# Patient Record
Sex: Male | Born: 1945 | Race: White | Hispanic: No | Marital: Married | Smoking: Former smoker
Health system: Southern US, Community
[De-identification: ages and names within clinical notes are randomized; demographics above are authoritative.]

## PROBLEM LIST (undated history)

## (undated) DIAGNOSIS — I499 Cardiac arrhythmia, unspecified: Secondary | ICD-10-CM

## (undated) DIAGNOSIS — T7840XA Allergy, unspecified, initial encounter: Secondary | ICD-10-CM

## (undated) DIAGNOSIS — H269 Unspecified cataract: Secondary | ICD-10-CM

## (undated) DIAGNOSIS — I10 Essential (primary) hypertension: Secondary | ICD-10-CM

## (undated) DIAGNOSIS — D649 Anemia, unspecified: Secondary | ICD-10-CM

## (undated) DIAGNOSIS — E669 Obesity, unspecified: Secondary | ICD-10-CM

## (undated) DIAGNOSIS — M1990 Unspecified osteoarthritis, unspecified site: Secondary | ICD-10-CM

## (undated) DIAGNOSIS — K317 Polyp of stomach and duodenum: Secondary | ICD-10-CM

## (undated) DIAGNOSIS — K859 Acute pancreatitis without necrosis or infection, unspecified: Secondary | ICD-10-CM

## (undated) DIAGNOSIS — K219 Gastro-esophageal reflux disease without esophagitis: Secondary | ICD-10-CM

## (undated) DIAGNOSIS — Z87440 Personal history of urinary (tract) infections: Secondary | ICD-10-CM

## (undated) DIAGNOSIS — C801 Malignant (primary) neoplasm, unspecified: Secondary | ICD-10-CM

## (undated) DIAGNOSIS — E785 Hyperlipidemia, unspecified: Secondary | ICD-10-CM

## (undated) DIAGNOSIS — I219 Acute myocardial infarction, unspecified: Secondary | ICD-10-CM

## (undated) DIAGNOSIS — I2510 Atherosclerotic heart disease of native coronary artery without angina pectoris: Secondary | ICD-10-CM

## (undated) DIAGNOSIS — Z8719 Personal history of other diseases of the digestive system: Secondary | ICD-10-CM

## (undated) HISTORY — DX: Acute myocardial infarction, unspecified: I21.9

## (undated) HISTORY — DX: Atherosclerotic heart disease of native coronary artery without angina pectoris: I25.10

## (undated) HISTORY — DX: Allergy, unspecified, initial encounter: T78.40XA

## (undated) HISTORY — PX: EYE SURGERY: SHX253

## (undated) HISTORY — DX: Obesity, unspecified: E66.9

## (undated) HISTORY — PX: HYDROCELE EXCISION / REPAIR: SUR1145

## (undated) HISTORY — DX: Essential (primary) hypertension: I10

## (undated) HISTORY — DX: Acute pancreatitis without necrosis or infection, unspecified: K85.90

## (undated) HISTORY — PX: OTHER SURGICAL HISTORY: SHX169

## (undated) HISTORY — DX: Anemia, unspecified: D64.9

## (undated) HISTORY — DX: Polyp of stomach and duodenum: K31.7

## (undated) HISTORY — DX: Unspecified cataract: H26.9

## (undated) HISTORY — DX: Gastro-esophageal reflux disease without esophagitis: K21.9

## (undated) HISTORY — PX: JOINT REPLACEMENT: SHX530

## (undated) HISTORY — DX: Hyperlipidemia, unspecified: E78.5

## (undated) HISTORY — PX: UPPER GASTROINTESTINAL ENDOSCOPY: SHX188

## (undated) HISTORY — PX: CARDIAC CATHETERIZATION: SHX172

---

## 1998-07-09 ENCOUNTER — Encounter: Payer: Self-pay | Admitting: *Deleted

## 1998-07-09 ENCOUNTER — Inpatient Hospital Stay (HOSPITAL_COMMUNITY): Admission: EM | Admit: 1998-07-09 | Discharge: 1998-07-13 | Payer: Self-pay | Admitting: Emergency Medicine

## 2002-09-23 ENCOUNTER — Inpatient Hospital Stay (HOSPITAL_COMMUNITY): Admission: EM | Admit: 2002-09-23 | Discharge: 2002-09-26 | Payer: Self-pay | Admitting: Emergency Medicine

## 2002-09-23 ENCOUNTER — Encounter: Payer: Self-pay | Admitting: Cardiology

## 2002-09-25 ENCOUNTER — Encounter: Payer: Self-pay | Admitting: Cardiology

## 2002-09-26 ENCOUNTER — Encounter: Payer: Self-pay | Admitting: Cardiology

## 2003-11-25 HISTORY — PX: COLONOSCOPY: SHX174

## 2003-12-11 ENCOUNTER — Encounter: Payer: Self-pay | Admitting: Internal Medicine

## 2003-12-29 ENCOUNTER — Encounter: Admission: RE | Admit: 2003-12-29 | Discharge: 2004-01-27 | Payer: Self-pay | Admitting: Family Medicine

## 2004-10-27 ENCOUNTER — Ambulatory Visit: Payer: Self-pay | Admitting: Cardiology

## 2005-12-13 ENCOUNTER — Ambulatory Visit: Payer: Self-pay | Admitting: Cardiology

## 2006-03-13 ENCOUNTER — Emergency Department (HOSPITAL_COMMUNITY): Admission: EM | Admit: 2006-03-13 | Discharge: 2006-03-14 | Payer: Self-pay | Admitting: Emergency Medicine

## 2006-11-25 LAB — HM COLONOSCOPY

## 2006-11-28 ENCOUNTER — Ambulatory Visit: Payer: Self-pay | Admitting: Cardiology

## 2008-01-01 ENCOUNTER — Ambulatory Visit: Payer: Self-pay | Admitting: Cardiology

## 2008-01-14 ENCOUNTER — Ambulatory Visit: Payer: Self-pay

## 2009-03-18 DIAGNOSIS — K219 Gastro-esophageal reflux disease without esophagitis: Secondary | ICD-10-CM

## 2009-03-18 DIAGNOSIS — E785 Hyperlipidemia, unspecified: Secondary | ICD-10-CM

## 2009-03-18 DIAGNOSIS — I10 Essential (primary) hypertension: Secondary | ICD-10-CM | POA: Insufficient documentation

## 2009-03-18 DIAGNOSIS — E669 Obesity, unspecified: Secondary | ICD-10-CM | POA: Insufficient documentation

## 2009-03-18 DIAGNOSIS — I2510 Atherosclerotic heart disease of native coronary artery without angina pectoris: Secondary | ICD-10-CM | POA: Insufficient documentation

## 2009-03-18 DIAGNOSIS — E119 Type 2 diabetes mellitus without complications: Secondary | ICD-10-CM | POA: Insufficient documentation

## 2009-03-18 DIAGNOSIS — I491 Atrial premature depolarization: Secondary | ICD-10-CM

## 2009-03-18 DIAGNOSIS — E782 Mixed hyperlipidemia: Secondary | ICD-10-CM | POA: Insufficient documentation

## 2009-04-28 ENCOUNTER — Ambulatory Visit: Payer: Self-pay | Admitting: Cardiology

## 2009-07-16 ENCOUNTER — Telehealth (INDEPENDENT_AMBULATORY_CARE_PROVIDER_SITE_OTHER): Payer: Self-pay | Admitting: *Deleted

## 2009-10-06 LAB — HEMOCCULT GUIAC POC 1CARD (OFFICE)

## 2010-01-24 LAB — HM DIABETES EYE EXAM

## 2010-03-23 ENCOUNTER — Ambulatory Visit: Payer: Self-pay | Admitting: Cardiology

## 2010-05-26 DEATH — deceased

## 2010-07-16 ENCOUNTER — Encounter: Payer: Self-pay | Admitting: Family Medicine

## 2010-07-17 ENCOUNTER — Encounter: Payer: Self-pay | Admitting: Family Medicine

## 2010-07-17 ENCOUNTER — Encounter: Payer: Self-pay | Admitting: Gastroenterology

## 2010-07-19 ENCOUNTER — Encounter
Admission: RE | Admit: 2010-07-19 | Discharge: 2010-07-19 | Payer: Self-pay | Source: Home / Self Care | Attending: Family Medicine | Admitting: Family Medicine

## 2010-07-26 NOTE — Assessment & Plan Note (Signed)
Summary: Marvin Frost   Visit Type:  Follow-up Primary Provider:  Vernon Prey, MD  CC:  CAD.  History of Present Illness: The patient presents for yearly followup. Since I last saw him he has had no new cardiovascular complaints. He underwent shoulder surgery without problems. He's had a little trouble remaining physically active because of this. However, he's been doing his activities of daily living and yard work without any cardiovascular complaints. He denies any chest discomfort, neck or arm discomfort. He's had no palpitations, presyncope or syncope. He's had no PND or orthopnea. He is not exercising as much as I would like.  Current Medications (verified): 1)  Benicar Hct 40-12.5 Mg Tabs (Olmesartan Medoxomil-Hctz) .Marland Kitchen.. 1 By Mouth Daily 2)  Glucosamine-Chondroitin  Caps (Glucosamine-Chondroit-Vit C-Mn) .Marland Kitchen.. 1 Po  Daily 3)  Aspirin 81 Mg  Tabs (Aspirin) .Marland Kitchen.. 1 By Mouth Daily 4)  Metformin Hcl 1000 Mg Tabs (Metformin Hcl) .Marland Kitchen.. 1 By Mouth Two Times A Day 5)  Levemir 100 Unit/ml Soln (Insulin Detemir) .... As Directed 6)  Victoza 18 Mg/38ml Soln (Liraglutide) .... As Directed 7)  Vitamin D3 2000 Unit Caps (Cholecalciferol) .... Daily 8)  Zetia 10 Mg Tabs (Ezetimibe) .Marland Kitchen.. 1 By Mouth Daily 9)  Protonix 40 Mg Tbec (Pantoprazole Sodium) .Marland Kitchen.. 1 By Mouth Daily 10)  Duetact 30-4 Mg Tabs (Pioglitazone Hcl-Glimepiride) .Marland Kitchen.. 1 By Mouth Daily 11)  Lovaza 1 Gm Caps (Omega-3-Acid Ethyl Esters) .Marland Kitchen.. 1 By Mouth Daily 12)  Multivitamins   Tabs (Multiple Vitamin) .Marland Kitchen.. 1 By Mouth Daily 13)  Crestor 5 Mg Tabs (Rosuvastatin Calcium) .Marland Kitchen.. 1 By Mouth Daily 14)  Voltaren 1 % Gel (Diclofenac Sodium) .... Qid  Allergies (verified): No Known Drug Allergies  Past History:  Past Medical History:  1. Coronary artery disease (Catheterizations in 2000 and 2004:  The LAD had 25% mid-long stenosis, mid to distal 30-40% stenosis, the circumflex included a small posterolateral with proximal 70% stenosis, the  right coronary artery was free of high-grade disease and dominant.  He had a stress perfusion study, which demonstrated no ischemia or infarct 2009.)   2. Premature atrial contractions.   3. Hypertension.   4. Diabetes for approximately 9 years.   5. Hyperlipidemia.   6. Gastroesophageal reflux disease.   7. Obesity.   Past Surgical History: Hydrocele repair.  Right shoulder surgery  Review of Systems       As stated in the HPI and negative for all other systems.   Vital Signs:  Patient profile:   65 year old male Height:      68 inches Weight:      232 pounds BMI:     35.40 Pulse rate:   83 / minute Resp:     16 per minute BP sitting:   112 / 70  (right arm)  Vitals Entered By: Marrion Coy, CNA (March 23, 2010 1:12 PM)  Physical Exam  General:  Well developed, well nourished, in no acute distress. Head:  normocephalic and atraumatic Eyes:  PERRLA/EOM intact; conjunctiva and lids normal. Mouth:  Teeth, gums and palate normal. Oral mucosa normal. Neck:  Neck supple, no JVD. No masses, thyromegaly or abnormal cervical nodes. Chest Wall:  no deformities or breast masses noted Lungs:  Clear bilaterally to auscultation and percussion. Heart:  Non-displaced PMI, chest non-tender; regular rate and rhythm, S1, S2 without murmurs, rubs or gallops. Carotid upstroke normal, no bruit. Normal abdominal aortic size, no bruits. Femorals normal pulses, no bruits. Pedals normal pulses. No edema, no  varicosities. Abdomen:  Bowel sounds positive; abdomen soft and non-tender without masses, organomegaly, or hernias noted. No hepatosplenomegaly. Msk:  Back normal, normal gait. Muscle strength and tone normal. Extremities:  No clubbing or cyanosis. Neurologic:  Alert and oriented x 3. Skin:  Intact without lesions or rashes. Cervical Nodes:  no significant adenopathy Psych:  Normal affect.   EKG  Procedure date:  03/23/2010  Findings:      Sinus rhythm, rate 84, axis within  normal limits, intervals within normal limits, no acute ST-T wave changes  Impression & Recommendations:  Problem # 1:  CAD (ICD-414.00) The patient has had no new symptoms and can continue with secondary risk reduction. His stress perfusion study was in 2009. No further testing is indicated.  Problem # 2:  HYPERLIPIDEMIA (ICD-272.4) I reviewed his recent lipid profile by Dr. Christell Constant. He is an excellent ratio and good combination therapy and I defer to Dr. Kathi Der care.  Problem # 3:  DM (ICD-250.00) He understands that this has not been as well controlled as we would like it and he continues to work on this. Certainly diet and exercise will contribute.  Problem # 4:  OBESITY (ICD-278.00) We discussed the need for weight loss with diet and exercise. Every year I emphasized routine exercise.  Patient Instructions: 1)  Your physician recommends that you schedule a follow-up appointment in: 1 year with Dr Antoine Poche 2)  Your physician recommends that you continue on your current medications as directed. Please refer to the Current Medication list given to you today.

## 2010-07-26 NOTE — Progress Notes (Signed)
  Phone Note From Other Clinic   Caller: Huntley Dec Details for Reason: Pt.Information Initial call taken by: Lowell Bouton over to the [facility] [phone] [facility]  July 16, 2009 1:49 PM

## 2010-08-15 ENCOUNTER — Telehealth (INDEPENDENT_AMBULATORY_CARE_PROVIDER_SITE_OTHER): Payer: Self-pay

## 2010-08-16 ENCOUNTER — Inpatient Hospital Stay (HOSPITAL_COMMUNITY): Payer: Medicare Other

## 2010-08-16 ENCOUNTER — Ambulatory Visit (INDEPENDENT_AMBULATORY_CARE_PROVIDER_SITE_OTHER): Payer: Medicare Other | Admitting: Physician Assistant

## 2010-08-16 ENCOUNTER — Encounter: Payer: Self-pay | Admitting: Physician Assistant

## 2010-08-16 ENCOUNTER — Inpatient Hospital Stay (HOSPITAL_COMMUNITY)
Admission: AD | Admit: 2010-08-16 | Discharge: 2010-08-19 | DRG: 440 | Disposition: A | Discharge status: Home / Self Care | Payer: Medicare Other | Source: Ambulatory Visit | Attending: Gastroenterology | Admitting: Gastroenterology

## 2010-08-16 DIAGNOSIS — R1013 Epigastric pain: Secondary | ICD-10-CM | POA: Diagnosis present

## 2010-08-16 DIAGNOSIS — R112 Nausea with vomiting, unspecified: Secondary | ICD-10-CM | POA: Insufficient documentation

## 2010-08-16 DIAGNOSIS — R509 Fever, unspecified: Secondary | ICD-10-CM

## 2010-08-16 DIAGNOSIS — E119 Type 2 diabetes mellitus without complications: Secondary | ICD-10-CM | POA: Diagnosis present

## 2010-08-16 DIAGNOSIS — K2970 Gastritis, unspecified, without bleeding: Secondary | ICD-10-CM | POA: Diagnosis present

## 2010-08-16 DIAGNOSIS — K859 Acute pancreatitis without necrosis or infection, unspecified: Principal | ICD-10-CM | POA: Diagnosis present

## 2010-08-16 DIAGNOSIS — T466X5A Adverse effect of antihyperlipidemic and antiarteriosclerotic drugs, initial encounter: Secondary | ICD-10-CM | POA: Diagnosis present

## 2010-08-16 DIAGNOSIS — R1011 Right upper quadrant pain: Secondary | ICD-10-CM | POA: Insufficient documentation

## 2010-08-16 DIAGNOSIS — K449 Diaphragmatic hernia without obstruction or gangrene: Secondary | ICD-10-CM | POA: Diagnosis present

## 2010-08-16 DIAGNOSIS — I2510 Atherosclerotic heart disease of native coronary artery without angina pectoris: Secondary | ICD-10-CM | POA: Diagnosis present

## 2010-08-16 DIAGNOSIS — E781 Pure hyperglyceridemia: Secondary | ICD-10-CM | POA: Diagnosis present

## 2010-08-16 DIAGNOSIS — I10 Essential (primary) hypertension: Secondary | ICD-10-CM | POA: Diagnosis present

## 2010-08-16 LAB — COMPREHENSIVE METABOLIC PANEL
ALT: 16 U/L (ref 0–53)
CO2: 23 mEq/L (ref 19–32)
Calcium: 8.8 mg/dL (ref 8.4–10.5)
Chloride: 94 mEq/L — ABNORMAL LOW (ref 96–112)
GFR calc non Af Amer: >60 mL/min (ref >60)
Glucose, Bld: 157 mg/dL — ABNORMAL HIGH (ref 70–99)
Sodium: 126 mEq/L — ABNORMAL LOW (ref 135–145)
Total Bilirubin: 0.6 mg/dL (ref 0.3–1.2)

## 2010-08-16 LAB — DIFFERENTIAL
Eosinophils Absolute: 0.3 10*3/uL (ref 0.0–0.7)
Eosinophils Relative: 3 % (ref 0–5)
Lymphs Abs: 1.6 10*3/uL (ref 0.7–4.0)

## 2010-08-16 LAB — CBC
HCT: 39.7 % (ref 39.0–52.0)
MCHC: 34.3 g/dL (ref 30.0–36.0)
MCV: 85.9 fL (ref 78.0–100.0)
RDW: 13.8 % (ref 11.5–15.5)

## 2010-08-16 LAB — URINALYSIS, ROUTINE W REFLEX MICROSCOPIC
Nitrite: NEGATIVE
Protein, ur: NEGATIVE mg/dL
Specific Gravity, Urine: 1.027 (ref 1.005–1.030)
Urobilinogen, UA: 0.2 mg/dL (ref 0.0–1.0)

## 2010-08-16 LAB — GLUCOSE, CAPILLARY

## 2010-08-16 LAB — LIPASE, BLOOD: Lipase: 38 U/L (ref 11–59)

## 2010-08-16 MED ORDER — IOHEXOL 300 MG/ML  SOLN
100.0000 mL | Freq: Once | INTRAMUSCULAR | Status: AC | PRN
  Administered 2010-08-16: 100 mL via INTRAVENOUS

## 2010-08-17 DIAGNOSIS — R1013 Epigastric pain: Secondary | ICD-10-CM

## 2010-08-17 DIAGNOSIS — K859 Acute pancreatitis without necrosis or infection, unspecified: Secondary | ICD-10-CM

## 2010-08-17 LAB — BASIC METABOLIC PANEL
BUN: 6 mg/dL (ref 6–23)
CO2: 24 mEq/L (ref 19–32)
Calcium: 8.3 mg/dL — ABNORMAL LOW (ref 8.4–10.5)
Chloride: 89 mEq/L — ABNORMAL LOW (ref 96–112)
Creatinine, Ser: 0.82 mg/dL (ref 0.4–1.5)
GFR calc Af Amer: >60 mL/min (ref >60)
GFR calc non Af Amer: >60 mL/min (ref >60)
Glucose, Bld: 299 mg/dL — ABNORMAL HIGH (ref 70–99)
Potassium: 3.8 mEq/L (ref 3.5–5.1)
Sodium: 120 mEq/L — ABNORMAL LOW (ref 135–145)

## 2010-08-17 LAB — GLUCOSE, CAPILLARY
Glucose-Capillary: 226 mg/dL — ABNORMAL HIGH (ref 70–99)
Glucose-Capillary: 268 mg/dL — ABNORMAL HIGH (ref 70–99)

## 2010-08-17 LAB — CBC
HCT: 37.5 % — ABNORMAL LOW (ref 39.0–52.0)
Hemoglobin: 13 g/dL (ref 13.0–17.0)
MCH: 30 pg (ref 26.0–34.0)
MCHC: 34.7 g/dL (ref 30.0–36.0)
MCV: 86.6 fL (ref 78.0–100.0)
Platelets: 278 10*3/uL (ref 150–400)
RBC: 4.33 MIL/uL (ref 4.22–5.81)
RDW: 13.6 % (ref 11.5–15.5)
WBC: 12.2 10*3/uL — ABNORMAL HIGH (ref 4.0–10.5)

## 2010-08-18 DIAGNOSIS — K859 Acute pancreatitis without necrosis or infection, unspecified: Secondary | ICD-10-CM

## 2010-08-18 DIAGNOSIS — R1013 Epigastric pain: Secondary | ICD-10-CM

## 2010-08-18 LAB — BASIC METABOLIC PANEL
BUN: 4 mg/dL — ABNORMAL LOW (ref 6–23)
CO2: 27 mEq/L (ref 19–32)
Calcium: 8.5 mg/dL (ref 8.4–10.5)
Chloride: 103 mEq/L (ref 96–112)
Creatinine, Ser: 0.97 mg/dL (ref 0.4–1.5)
GFR calc Af Amer: >60 mL/min (ref >60)
GFR calc non Af Amer: >60 mL/min (ref >60)
Glucose, Bld: 304 mg/dL — ABNORMAL HIGH (ref 70–99)
Potassium: 4.1 mEq/L (ref 3.5–5.1)
Sodium: 133 mEq/L — ABNORMAL LOW (ref 135–145)

## 2010-08-18 LAB — CBC
HCT: 38.5 % — ABNORMAL LOW (ref 39.0–52.0)
Hemoglobin: 12.5 g/dL — ABNORMAL LOW (ref 13.0–17.0)
MCH: 28.5 pg (ref 26.0–34.0)
MCHC: 32.5 g/dL (ref 30.0–36.0)
MCV: 87.9 fL (ref 78.0–100.0)
Platelets: 263 10*3/uL (ref 150–400)
RBC: 4.38 MIL/uL (ref 4.22–5.81)
RDW: 13.7 % (ref 11.5–15.5)
WBC: 6.9 10*3/uL (ref 4.0–10.5)

## 2010-08-18 LAB — GLUCOSE, CAPILLARY
Glucose-Capillary: 298 mg/dL — ABNORMAL HIGH (ref 70–99)
Glucose-Capillary: 183 mg/dL — ABNORMAL HIGH (ref 70–99)
Glucose-Capillary: 190 mg/dL — ABNORMAL HIGH (ref 70–99)
Glucose-Capillary: 217 mg/dL — ABNORMAL HIGH (ref 70–99)

## 2010-08-19 ENCOUNTER — Encounter: Payer: Self-pay | Admitting: Gastroenterology

## 2010-08-19 ENCOUNTER — Encounter (INDEPENDENT_AMBULATORY_CARE_PROVIDER_SITE_OTHER): Payer: Self-pay | Admitting: *Deleted

## 2010-08-19 DIAGNOSIS — K2970 Gastritis, unspecified, without bleeding: Secondary | ICD-10-CM

## 2010-08-19 DIAGNOSIS — K2990 Gastroduodenitis, unspecified, without bleeding: Secondary | ICD-10-CM

## 2010-08-19 LAB — GLUCOSE, CAPILLARY

## 2010-08-22 ENCOUNTER — Encounter: Payer: Self-pay | Admitting: Internal Medicine

## 2010-08-23 NOTE — Letter (Signed)
Summary: New Patient letter  [facility]  [address] [place], [place] [address]   Phone: [phone]  Fax: [phone]       08/19/2010 MRN: [id number]  Marvin Frost [address] [place], [place]  [address]  [place]  Dear Mr. Eschbach,  Welcome to the Gastroenterology Division at [facility].    You are scheduled to see Dr.  Leone Payor on 09-27-10 at 10:00AM. on the 3rd floor at [facility], [address]  We ask that you try to arrive at our office 15 minutes prior to your appointment time to allow for check-in.  We would like you to complete the enclosed self-administered evaluation form prior to your visit and bring it with you on the day of your appointment.  We will review it with you.  Also, please bring a complete list of all your medications or, if you prefer, bring the medication bottles and we will list them.  Please bring your insurance card so that we may make a copy of it.  If your insurance requires a referral to see a specialist, please bring your referral form from your primary care physician.  Co-payments are due at the time of your visit and may be paid by cash, check or credit card.     Your office visit will consist of a consult with your physician (includes a physical exam), any laboratory testing he/she may order, scheduling of any necessary diagnostic testing (e.g. x-ray, ultrasound, CT-scan), and scheduling of a procedure (e.g. Endoscopy, Colonoscopy) if required.  Please allow enough time on your schedule to allow for any/all of these possibilities.    If you cannot keep your appointment, please call [phone] to cancel or reschedule prior to your appointment date.  This allows [place] the opportunity to schedule an appointment for another patient in need of care.  If you do not cancel or reschedule by 5 p.m. the business day prior to your appointment date, you will be charged a $50.00 late cancellation/no-show fee.    Thank you for choosing  [facility] for your medical needs.  We appreciate the opportunity to care for you.  Please visit [place] at our website  to learn more about our practice.                     Sincerely,                                                             The Gastroenterology Division

## 2010-08-23 NOTE — Progress Notes (Signed)
Summary: Abdominal pain  Phone Note Outgoing Call Call back at [facility] Phone [phone]   Call placed by: Darcey Nora RN, CGRN,  August 15, 2010 3:50 PM Call placed to: Patient Summary of Call: Dr Marina Goodell was contacted by Dr Christell Constant from [facility] [employer], patient has been left a message to please call back to schedule an appointment .  Patient has hx with Dr Leone Payor in 2005 Initial call taken by: Darcey Nora RN, CGRN,  August 15, 2010 4:00 PM  Follow-up for Phone Call        Patient will come in and see Mike Gip PA tomorrow at 2:00.  I have called [facility] for records. Follow-up by: Darcey Nora RN, CGRN,  August 15, 2010 4:45 PM

## 2010-08-23 NOTE — Initial Assessments (Signed)
History of Present Illness Visit Type: Follow-up Visit Primary GI MD: Stan Head MD [facility] Primary Provider: Vernon Prey, MD Requesting Provider: Vernon Prey, MD Chief Complaint: Patient complains of abdominal pain and nausea for about Saturday morning. He complains of some vomiting.  History of Present Illness:   VERY NICE 65 Y.O MALE KNOWN TO DR. Leone Payor FROM PRIOR COLONOSCOPY IN 2005 WHICH SHOWED LEFT SIDED DIVERTICULOSIS. PT IS REFERRED NOW BY PRIMRY FOR EVALUATION OF ACUTE UPPER ABDOMINAL PAIN,INSET ABOUT 5 DAYS AGO.HE DEVELOPED PAIN ON 2/17 AFTER DINNER AND SAYS HE HAS HAD CONSTANT FAIRLY INTENSE  PAIN SINCE. PAIN IS LOCATED EPIGASTRIUM AND RUQ WITH RADIATION INTO HIS BACK. HE HAS HAD NAUSEA, AND INTERMITTENT VOMITING, NO APPETITE. HAS ALSO HAD FEVERS TO 101 INTERMITTENTLY SINCE 2/18. NO RIGORS, NO SWEATS. NO URINARY SXS. PAIN HAS NOT MIGRATED. HE IS ON CHRONIC PROTONIX FOR GERD.  HE HAD AN ABDOMINAL US ABOUT 3 WEEKS AGO FOR MILDER ABDOMINAL DISCOMFORT AND THE WAS NEGATIVE FOR GALLSTONES, NORMAL CBD,PANCREAS NOT WELL SEEN,DIFFUSE FATTY CHANGES OF LIVER.  LABS DONE 2/20 SHOW -WBC 12.4,HGB14.4. LFTS FROM JAN 2012 WERE NORMAL.   GI Review of Systems    Reports abdominal pain, nausea, and  vomiting.     Location of  Abdominal pain: upper abdomen.    Denies acid reflux, belching, bloating, chest pain, dysphagia with liquids, dysphagia with solids, heartburn, loss of appetite, vomiting blood, weight loss, and  weight gain.        Denies anal fissure, black tarry stools, change in bowel habit, constipation, diarrhea, diverticulosis, fecal incontinence, heme positive stool, hemorrhoids, irritable bowel syndrome, jaundice, light color stool, liver problems, rectal bleeding, and  rectal pain.    Current Medications (verified): 1)  Benicar Hct 40-12.5 Mg Tabs (Olmesartan Medoxomil-Hctz) .Marland Kitchen.. 1 By Mouth Daily 2)  Glucosamine-Chondroitin  Caps (Glucosamine-Chondroit-Vit C-Mn) .Marland Kitchen.. 1 Po  Daily 3)   Aspirin 81 Mg  Tabs (Aspirin) .Marland Kitchen.. 1 By Mouth Daily 4)  Metformin Hcl 1000 Mg Tabs (Metformin Hcl) .Marland Kitchen.. 1 By Mouth Two Times A Day 5)  Levemir 100 Unit/ml Soln (Insulin Detemir) .... 20 Units Two Times A Day 6)  Victoza 18 Mg/65ml Soln (Liraglutide) .... 18mg  in The Morning 7)  Vitamin D3 2000 Unit Caps (Cholecalciferol) .... Daily 8)  Zetia 10 Mg Tabs (Ezetimibe) .Marland Kitchen.. 1 By Mouth Daily 9)  Protonix 40 Mg Tbec (Pantoprazole Sodium) .Marland Kitchen.. 1 By Mouth in The Morning 10)  Lovaza 1 Gm Caps (Omega-3-Acid Ethyl Esters) .Marland Kitchen.. 1 By Mouth Daily 11)  Multivitamins   Tabs (Multiple Vitamin) .Marland Kitchen.. 1 By Mouth Daily 12)  Crestor 5 Mg Tabs (Rosuvastatin Calcium) .Marland Kitchen.. 1 By Mouth Daily 13)  Voltaren 1 % Gel (Diclofenac Sodium) .... Qid 14)  Dexilant 60 Mg Cpdr (Dexlansoprazole) .... Take One By Mouth At Bedtime 15)  Meclizine Hcl 12.5 Mg Tabs (Meclizine Hcl) .... Take One By Mouth Four Times A Day 16)  Flonase 50 Mcg/act Susp (Fluticasone Propionate) .Marland Kitchen.. 1 Spray Each Nostril At Bedtime 17)  Trilipix 135 Mg Cpdr (Choline Fenofibrate) .... Take One By Mouth Every 3 Days  Allergies (verified): No Known Drug Allergies  Past History:  Past Medical History: Reviewed history from 03/23/2010 and no changes required.  1. Coronary artery disease (Catheterizations in 2000 and 2004:  The LAD had 25% mid-long stenosis, mid to distal 30-40% stenosis, the circumflex included a small posterolateral with proximal 70% stenosis, the right coronary artery was free of high-grade disease and dominant.  He had a stress perfusion  study, which demonstrated no ischemia or infarct 2009.)   2. Premature atrial contractions.   3. Hypertension.   4. Diabetes for approximately 9 years.   5. Hyperlipidemia.   6. Gastroesophageal reflux disease.   7. Obesity.   Past Surgical History: Reviewed history from 03/23/2010 and no changes required. Hydrocele repair.  Right shoulder surgery  Family History: No FH of Colon Cancer: Family  History of Heart Disease: mother, father Family History of Diabetes: brothers, great uncles  Social History:  non-smoker, non-drinker, no drug abuse Daily Caffeine Use couple cups coffee and several soft drinks.  Review of Systems       The patient complains of arthritis/joint pain and muscle pains/cramps.  The patient denies allergy/sinus, anemia, anxiety-new, back pain, blood in urine, breast changes/lumps, change in vision, confusion, cough, coughing up blood, depression-new, fainting, fatigue, fever, headaches-new, hearing problems, heart murmur, heart rhythm changes, itching, night sweats, nosebleeds, shortness of breath, skin rash, sleeping problems, sore throat, swelling of feet/legs, swollen lymph glands, thirst - excessive, urination - excessive, urination changes/pain, urine leakage, vision changes, and voice change.         SEE HPI  Vital Signs:  Patient profile:   65 year old male Height:      68 inches Weight:      216.4 pounds BMI:     33.02 Pulse rate:   88 / minute Pulse rhythm:   regular BP sitting:   110 / 62  (left arm) Cuff size:   regular  Vitals Entered By: Harlow Mares CMA Duncan Dull) (August 16, 2010 1:45 PM)  Physical Exam  General:  Well developed, well nourished, no acute distress. Head:  Normocephalic and atraumatic. Eyes:  PERRLA, no icterus. Lungs:  Clear throughout to auscultation. Heart:  Regular rate and rhythm; no murmurs, rubs,  or bruits. Abdomen:  SOFT, TENDER EPIGASTRIUM AND RUQ WITH GUARDING, NO REBOUND, NO PALP MASS OR HSM,BS+ Rectal:  NOT DONE Extremities:  No clubbing, cyanosis, edema or deformities noted. Neurologic:  Alert and  oriented x4;  grossly normal neurologically. Psych:  Alert and cooperative. Normal mood and affect.   Impression & Recommendations:  Problem # 1:  ABDOMINAL PAIN-EPIGASTRIC (ICD-789.06) Assessment New 64 YO MALE WITH ACUTE EPIGASTRIC AND RUQ PAIN X 5 DAYS ASSOCIATED WITH NAUSEA, VOMITING AND FEVER. SUSPECT  GALLBLADDER DISEASE THOUGH NEAGTIVE [place] RECENTLY -R/O ACALCULOUS CHOLECYSTITIS,R/O PANCREATITIS,R/O RETRO CECAL APPENDICITIS.  ADMIT TO [facility] BASELINE LABS CT ABDOMEN/PELVIS TODAYPAIN CONTROL IV UNASYN SEE ORDERS FOR FURTHER DETAILS.  Problem # 2:  GERD (ICD-530.81) Assessment: Comment Only CONTINUE PROTONIX  Problem # 3:  HYPERLIPIDEMIA (ICD-272.4) Assessment: Comment Only  Problem # 4:  DM (ICD-250.00) Assessment: Comment Only  Problem # 5:  HYPERTENSION (ICD-401.9) Assessment: Comment Only  Problem # 6:  CAD (ICD-414.00) Assessment: Comment Only STABLE. WOULD LIKE DR. HOCHREIN NOTIFIED FOR CLEARANCE IF NEEDS SURGERY

## 2010-08-23 NOTE — Procedures (Signed)
Summary: Upper Endoscopy  Patient: [name] Note: All result statuses are Final unless otherwise noted.  Tests: (1) Upper Endoscopy (EGD)   EGD Upper Endoscopy       DONE     [facility]     [address] [place], [place]  [address]           ENDOSCOPY PROCEDURE REPORT           PATIENT:  Marvin Frost, Marvin Frost  MR#:  [id number]     BIRTHDATE:  22-Mar-1946, 64 yrs. old  GENDER:  male     ENDOSCOPIST:  Rachael Fee, MD     PROCEDURE DATE:  08/19/2010     PROCEDURE:  EGD, diagnostic 43235     ASA CLASS:  Class II     INDICATIONS:  abd pain, likely from mild pancreatitis but had     intermittent pains preceeding the acute pain episode     MEDICATIONS:  Fentanyl 75 mcg IV, Versed 6 mg IV     TOPICAL ANESTHETIC:  none           DESCRIPTION OF PROCEDURE:   After the risks benefits and     alternatives of the procedure were thoroughly explained, informed     consent was obtained.  The  endoscope was introduced through the     mouth and advanced to the second portion of the duodenum, without     limitations.  The instrument was slowly withdrawn as the mucosa     was fully examined.     <<PROCEDUREIMAGES>>           There was mild, non-specific gastritis (see image7).  A hiatal     hernia was found. This was 2cm (see image4).  Otherwise the     examination was normal (see image6, image5, image3, image2, and     image1).    Retroflexed views revealed no abnormalities.    The     scope was then withdrawn from the patient and the procedure     completed.           COMPLICATIONS:  None           ENDOSCOPIC IMPRESSION:     1) Mild, non-specific gastritis     2) Small hiatal hernia     3) Otherwise normal examination; these findings do not explain     his recent discomforts.           RECOMMENDATIONS:     Solid food diet, if he tolerates then OK to go home later today.           Will need follow up appt with Dr. Leone Payor in 7-8 weeks.     Should not restart Triliplex  (likely the cause of his     pancreatitis).           ______________________________     Rachael Fee, MD           cc: Stan Head, MD           n.     eSIGNED:   Rachael Fee at 08/19/2010 09:02 AM           Henry Russel, [id number]  Note: An exclamation mark (!) indicates a result that was not dispersed into the flowsheet. Document Creation Date: 08/19/2010 9:03 AM _______________________________________________________________________  (1) Order result status: Final Collection or observation date-time: 08/19/2010 08:52 Requested date-time:  Receipt date-time:  Reported date-time:  Referring Physician:   Ordering Physician: Rob Bunting [phone]) Specimen Source:  Source: Launa Grill Order Number: [id number] Lab site:

## 2010-08-23 NOTE — Procedures (Signed)
Summary: Colonoscopy   Colonoscopy  Procedure date:  12/11/2003  Findings:      Location:  [facility].   Patient Name: Marvin Frost, Marvin Frost MRN:  Procedure Procedures: Colonoscopy CPT: [phone].  Personnel: Endoscopist: Iva Boop, MD, [facility].  Referred By: Rudi Heap, MD.  Exam Location: Exam performed in Outpatient Clinic. Outpatient  Patient Consent: Procedure, Alternatives, Risks and Benefits discussed, consent obtained, from patient. Consent was obtained by the RN.  Indications Symptoms: Rare rectal bleeding.  Average Risk Screening Routine.  History  Current Medications: Patient is not currently taking Coumadin.  Pre-Exam Physical: Performed Dec 11, 2003. Cardio-pulmonary exam, Rectal exam, HEENT exam , Abdominal exam, Mental status exam WNL.  Exam Exam: Extent of exam reached: Cecum, extent intended: Cecum.  The cecum was identified by appendiceal orifice and IC valve. Patient position: on left side. Colon retroflexion performed. Images taken. ASA Classification: III. Tolerance: excellent.  Monitoring: Pulse and BP monitoring, Oximetry used. Supplemental O2 given.  Colon Prep Used Half-Lytely for colon prep. Prep results: excellent.  Sedation Meds: Patient assessed and found to be appropriate for moderate (conscious) sedation. Fentanyl 75 mcg. given IV. Versed 5 mg. given IV.  Findings - NORMAL EXAM: Cecum to Descending Colon.  DIVERTICULOSIS: Sigmoid Colon. Not bleeding. ICD9: Diverticulosis, Colon: 562.10. Comments: moderately-severe.  NORMAL EXAM: Rectum.   Assessment Abnormal examination, see findings above.  Diagnoses: 562.10: Diverticulosis, Colon.   Comments: NO POLYPS OR CANCER SEEN Events  Unplanned Interventions: No intervention was required.  Plans Patient Education: Patient given standard instructions for: Diverticulosis. Yearly hemoccult testing recommended. WOULD START HEMOCCULTS IN 5 YEARS.  Comments: SWITCH PPI TO  PROTONIX, 1 YEAR RX WRITTEN Disposition: After procedure patient sent to recovery. After recovery patient sent home.  Scheduling/Referral: Primary Care Provider, to Rudi Heap, MD, AS PLANNED,  Colonoscopy, to Iva Boop, MD, [facility], 10 YEARS FOR SCREENING,  Clinic Visit, to Iva Boop, MD, [facility], AS NEEDED,    cc: Shawna Clamp, MD

## 2010-09-15 NOTE — Discharge Summary (Signed)
NAME:  Marvin Frost, Marvin Frost NO.:  [id number]  MEDICAL RECORD NO.:  [id number]           PATIENT TYPE:  I  LOCATION:  1314                         FACILITY:  [facility]  PHYSICIAN:  Rachael Fee, MD   DATE OF BIRTH:  08-02-1945  DATE OF ADMISSION:  08/16/2010 DATE OF DISCHARGE:  08/19/2010                              DISCHARGE SUMMARY   DISCHARGING PHYSICIAN:  Rachael Fee, MD  PRIMARY GASTROENTEROLOGIST:  Iva Boop, MD, Clementeen Graham  DISPOSITION:  Home in stable condition.  DISCHARGE DIAGNOSES: 1. Acute pancreatitis, mild.  Etiology was felt to be secondary to     Trilipix which the patient recently started for     hypertriglyceridemia.  Symptoms greatly improved at time of     discharge. 2. Coronary artery disease. 3. Hypertension. 4. Diabetes, on oral agents and insulin at home. 5. Gastroesophageal reflux disease.  DISCHARGE MEDICATIONS:  Continue home medications including, 1. Fluticasone nasal spray 50 mcg 1 spray daily as needed. 2. Meclizine 12.5 mg 1 tablet 4 times a day as needed. 3. Voltaren gel 1% topical 4 times a day as needed. 4. Dexilant 60 mg 1 tablet every evening. 5. Multiple vitamins 1 tablet daily. 6. Lovaza 1 g 1 tablet every evening. 7. Glimepiride 4 mg 1 tablet every evening. 8. Zetia 10 mg 1 tablet every evening. 9. Vitamin D3 1000 units 2 tablets every morning. 10.Crestor 5 mg 1 tablet every morning. 11.Victoza subcutaneous 1.8 mg every morning. 12.Levemir 18 units subcutaneous twice daily. 13.Metformin 1000 mg twice daily. 14.Glucosamine chondroitin 1 tablet every morning. 15.Aspirin 81 mg daily. 16.Benicar HCT 40/25 mg 1 tablet every morning.  CONSULTATIONS:  None requested this admission.  PROCEDURES:  EGD by Dr. Rob Bunting.  HOSPITAL COURSE:  Marvin Frost is a 65 year old male who was admitted directly from our office on August 16, 2010, after presenting with acute epigastric and right upper quadrant pain, associated  with nausea, vomiting, and fever.  Initially, gallbladder disease was suspected despite an unremarkable ultrasound.  Upon admission, the patient had a CT scan of the abdomen and pelvis which actually showed mild acute pancreatitis. The patient had no history of gallstones, no alcohol use.  Marvin Frost had been recently started on Trilipix for treatment of hypertriglyceridemia. The medication had a known potential side effect of pancreatitis. Marvin Frost was admitted to a medical bed for IV hydration, antiemetics,  analgesics, and additional supportive care.  Dr.Donald Christell Constant, the patient's  primary care provider was contacted for records, specifically, recent triglyceride levels.  Labs from July 18, 2010, showed a triglyceride level of only 223. Since  that was not high enough to cause pancreatitis it reconfirmed our suspicion that the patient's pancreatitis was medication related.On admission, Marvin Frost white count was 12.2, hemoglobin 13.6, hematocrit 39.7.  Sodium was low at 126, potassium okay at 3.6, chloride low at 94, glucose 157.  Renal function was normal.  LFTs normal except for a albumin of 3.53.  Amylase was 37 and lipase 38 on admission.  C-reactive protein 12.2.  On admission, the patient had been started on Unasyn, but that  was discontinued on the 2nd day.  White count normalized to 6.9 within 2 days of admission.  Marvin Frost mentioned that he was having some epigastric discomfort PRIOR to starting Trilipix.  Given the uncertainty of whether all of his pain was all related to pancreatitis or whether something else was going on,   he underwent an EGD by Dr. Christella Hartigan.  The examination was unremarkable except for small hiatal hernia and mild nonspecific gastritis. By February 24th, Marvin Frost was feeling somewhat better, his IV fluids were decreased and he was given a low-fat solid diet.  Plan was for discharge if Marvin Frost tolerated the solid diet which he did so.  He was  discharged on his home medications with the exception of Trilipix.  He was given some oxycodone and Zofran to take as needed.  The patient was given a follow up appointment with Dr. Leone Payor, his primary gastroenterologist, on March 4th.     Willette Cluster, NP   ______________________________ Rachael Fee, MD    PG/MEDQ  D:  08/19/2010  T:  08/19/2010  Job:  [id number]  Electronically Signed by Willette Cluster NP on 09/12/2010 07:56:13 PM Electronically Signed by Rob Bunting MD on 09/15/2010 04:45:30 PM

## 2010-09-20 ENCOUNTER — Ambulatory Visit: Payer: Medicare Other | Attending: Family Medicine | Admitting: Physical Therapy

## 2010-09-20 DIAGNOSIS — IMO0001 Reserved for inherently not codable concepts without codable children: Secondary | ICD-10-CM | POA: Insufficient documentation

## 2010-09-20 DIAGNOSIS — R42 Dizziness and giddiness: Secondary | ICD-10-CM | POA: Insufficient documentation

## 2010-09-20 DIAGNOSIS — R269 Unspecified abnormalities of gait and mobility: Secondary | ICD-10-CM | POA: Insufficient documentation

## 2010-09-27 ENCOUNTER — Other Ambulatory Visit (INDEPENDENT_AMBULATORY_CARE_PROVIDER_SITE_OTHER): Payer: Medicare Other

## 2010-09-27 ENCOUNTER — Encounter: Payer: Self-pay | Admitting: Internal Medicine

## 2010-09-27 ENCOUNTER — Ambulatory Visit (INDEPENDENT_AMBULATORY_CARE_PROVIDER_SITE_OTHER): Payer: Medicare Other | Admitting: Internal Medicine

## 2010-09-27 VITALS — BP 148/74 | HR 84 | Ht 68.0 in | Wt 218.2 lb

## 2010-09-27 DIAGNOSIS — K859 Acute pancreatitis without necrosis or infection, unspecified: Secondary | ICD-10-CM | POA: Insufficient documentation

## 2010-09-27 DIAGNOSIS — R933 Abnormal findings on diagnostic imaging of other parts of digestive tract: Secondary | ICD-10-CM

## 2010-09-27 NOTE — Progress Notes (Signed)
HPI: 65 year old white man here for followup after hospitalization with epigastric pain nausea and vomiting. He had an enlarged pancreatic head and uncinate process on CT. An upper GI endoscopy was unrevealing. It was thought that he had pancreatitis to 2 medications. He had started Trilpix a few days before the hospitalization. He was also on Victoza.  His amylase and lipase were normal. After discontinuing the he improved greatly and he feels back to normal now. The clinical pharmacist at his primary care office also stopped the Victoza.    Physical exam: Eyes anicteric Well-developed well-nourished no acute distress, obese Lungs clear anteriorly  heart S1-S2 no rubs murmurs or gallops abdomen obese soft and nontender without organomegaly or mass   Psych appropriate mood and affect

## 2010-09-27 NOTE — Assessment & Plan Note (Signed)
He had an enlarged pancreatic head and uncinate process in late February on CT. Normal pancreatic enzymes. There was associated epigastric pain and nausea and vomiting. He relates that he had a similar episode though milder in December of 2011. He promptly improved after discontinuation of medication (Trilpix and then Victoza) and it may be that he had a reaction to one or both medications. Given the normal enzymes raising slight uncertainty and really cannot diagnose pancreatitis without abnormal enzymes, will follow up the abnormal imaging of the CT concerning the pancreas with an MRI and MRCP.

## 2010-09-27 NOTE — Patient Instructions (Addendum)
Your MRI is scheduled at [facility] on 10/06/2010 at 10am, You will need to arrive at 9:45am Nothing to eat or drink 4 hours prior to your test You will go to the basement for labs today Cc. Donadl Moore,MD

## 2010-09-28 LAB — CREATININE, SERUM: Creatinine, Ser: 0.9 mg/dL (ref 0.4–1.5)

## 2010-10-06 ENCOUNTER — Ambulatory Visit (HOSPITAL_COMMUNITY)
Admission: RE | Admit: 2010-10-06 | Discharge: 2010-10-06 | Disposition: A | Discharge status: Home / Self Care | Payer: Medicare Other | Source: Ambulatory Visit | Attending: Internal Medicine | Admitting: Internal Medicine

## 2010-10-06 DIAGNOSIS — Q453 Other congenital malformations of pancreas and pancreatic duct: Secondary | ICD-10-CM | POA: Insufficient documentation

## 2010-10-06 DIAGNOSIS — R933 Abnormal findings on diagnostic imaging of other parts of digestive tract: Secondary | ICD-10-CM

## 2010-10-06 DIAGNOSIS — K859 Acute pancreatitis without necrosis or infection, unspecified: Secondary | ICD-10-CM | POA: Insufficient documentation

## 2010-10-10 NOTE — Progress Notes (Signed)
Left message for patient to call back  

## 2010-10-10 NOTE — Progress Notes (Signed)
Patient aware.

## 2010-10-10 NOTE — Progress Notes (Signed)
Quick Note:  No masses/tumors, etc. 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

## 2010-10-11 MED ORDER — GADOBENATE DIMEGLUMINE 529 MG/ML IV SOLN: 20.0000 mL | Freq: Once | INTRAVENOUS | Status: AC | PRN

## 2010-10-22 ENCOUNTER — Encounter: Payer: Self-pay | Admitting: Family Medicine

## 2010-10-26 MED ORDER — GADOBENATE DIMEGLUMINE 529 MG/ML IV SOLN
20.0000 mL | Freq: Once | INTRAVENOUS | Status: AC | PRN
  Administered 2010-10-26: 20 mL via INTRAVENOUS

## 2010-11-08 NOTE — Assessment & Plan Note (Signed)
[facility]                            CARDIOLOGY OFFICE NOTE   OSMIN, WELZ                          MRN:          [id number]  DATE:11/28/2006                            DOB:          [birth year]    PRIMARY CARE PHYSICIAN:  Ernestina Penna, M.D.   REASON FOR PRESENTATION:  Evaluate patient with nonobstructive coronary  disease.   HISTORY OF PRESENT ILLNESS:  Patient is now 65 years old.  He has done  well since I last saw him.  He works still 10-12 hours a day.  He does  yard work on the weekends.  With this level of activity, he denies any  chest discomfort, neck or arm discomfort.  He has had no palpitations,  presyncope or syncope.  He has had no PND or orthopnea.   PAST MEDICAL HISTORY:  Coronary artery disease.  (Catheterizations in  2000 and 2004:  The LAD had 25% mid-long stenosis, mid to distal 30-40%  stenosis, the circumflex included a small posterolateral with proximal  70% stenosis, the right coronary artery was free of high-grade disease  and dominant.  He had a stress perfusion study, which demonstrated no  ischemia or infarct.)  Premature atrial contractions, hypertension,  diabetes for approximately nine years, hyperlipidemia, gastroesophageal  reflux disease, obesity, hydrocele repair.   ALLERGIES:  Patient will get muscle aches from STATINS.   MEDICATIONS:  1. Zetia 10 mg q.a.m.  2. Benicar 40/25 q.a.m.  3. Aspirin 325 mg daily.  4. Glucosamine one tablet q.a.m.  5. Metformin 1000 mg b.i.d.  6. Protonix 40 mg q.p.m.  7. Byetta 5 mg q.p.m., once-a-day.  8. Omacor 1 mg q.p.m.  9. Levemir.  10.Lantus.   REVIEW OF SYSTEMS:  As stated in the HPI and otherwise negative for  other systems.   PHYSICAL EXAMINATION:  The patient is in no distress.  Blood pressure  150/88, heart rate 76 and regular.  Weight 237 pounds.  Body mass index  36.  HEENT:  Eyelids unremarkable.  Pupils equal, round and reactive to  light.  Fundi  within normal limits.  Oral mucosa unremarkable.  NECK:  No jugular venous distention, wave form within normal limits,  carotid upstroke brisk and symmetric.  No bruits, no thyromegaly.  LYMPHATICS:  No cervical, axillary or inguinal adenopathy.  LUNGS:  Clear to auscultation bilaterally.  BACK:  No costovertebral angle tenderness.  CHEST:  Unremarkable.  HEART:  PMI not displaced or sustained.  S1 and S2 within normal limits.  No S3, no S4, no clicks, no rubs, no murmurs.  ABDOMEN:  Obese, positive bowel sounds, normal in frequency and pitch.  No bruits, no rebound, no guarding, no midline pulsatile mass, no  hepatomegaly, no splenomegaly.  SKIN:  No rashes, no nodules.  EXTREMITIES:  Two-plus pulses throughout, no edema, no cyanosis or  clubbing.  NEUROLOGIC:  Oriented to person, place and time.  Cranial nerves II  through XII grossly intact.  Motor grossly intact throughout.   EKG:  Sinus rhythm, rate 76, axis within normal limits, intervals within  normal limits.  No acute STT-wave changes.   ASSESSMENT AND PLAN:  1. Coronary disease:  Patient has coronary disease as described.  He      has ongoing risk factors.  However, he is not having any symptoms.      I have encouraged more activity and we discussed this at length      again today.  He is not going to have any further testing at this      point.  We will continue with primary risk reduction.  2. Obesity:  Again, we discussed the need to loose weight with diet      and exercise.  3. Hypertension:  Blood pressure is slightly elevated today.  However,      he takes it at home; it is in the 120s over 80s.  I told him that      120s over 70s would be ideal with his diabetes.  Hopefully, with      more weight-loss, this will be always at that target.   FOLLOWUP:  I will see him back in one year.  At that time, I am going to  decide on another stress test, which I most likely will do, as it will  have been five years.      Rollene Rotunda, MD, [facility]  Electronically Signed    JH/MedQ  DD: 11/28/2006  DT: 11/28/2006  Job #: [id number]   cc:   Ernestina Penna, M.D.

## 2010-11-08 NOTE — Assessment & Plan Note (Signed)
[facility]                            CARDIOLOGY OFFICE NOTE   Marvin Frost, Marvin Frost                          MRN:          [id number]  DATE:01/01/2008                            DOB:          [birth year]    PRIMARY CARE PHYSICIAN:  Ernestina Penna, M.D.   REASON FOR PRESENTATION:  Evaluate the patient with nonobstructive  coronary disease.   HISTORY OF PRESENT ILLNESS:  The patient is now 65 years old.  He has  done well since I last saw him.  He is still working.  His most vigorous  activity is working in his yard on the weekends.  With this level of  activity, he denies any chest discomfort, neck or arm discomfort.  He  has had no palpitation, presyncope, syncope.  He has had no PND or  orthopnea.  He is having his lipids followed and his blood sugar  carefully at [facility].  Unfortunately, he has not been able to  lose weight.   PAST MEDICAL HISTORY:  1. Coronary artery disease (see the November 28, 2006 note for details).  2. Premature atrial contractions.  3. Hypertension.  4. Diabetes for approximately 9 years.  5. Hyperlipidemia.  6. Gastroesophageal reflux disease.  7. Obesity.  8. Hydrocele repair.   ALLERGIES AND INTOLERANCES:  The patient has muscle aches with STATINS,  though he is tolerating low-dose Crestor currently.   MEDICATIONS:  1. Zetia 10 mg daily.  2. Benicar/Hydrochlorothiazide 40/25 daily.  3. Aspirin 325 mg daily.  4. Glucosamine.  5. Metformin 1000 mg b.i.d.  6. Once a day vitamin.  7. Omacor.  8. Levemir.  9. Nexium 40 mg daily.  10.Crestor 5 mg Monday, Wednesday and Friday.  11.Byetta 10 mg daily.   REVIEW OF SYSTEMS:  Review of systems positive for reflux at night.  Otherwise, negative for all other systems.   PHYSICAL EXAMINATION:  GENERAL:  The patient is in no distress.  VITALS:  Blood pressure 144/88, heart rate 79 and regular, weight 239  pounds, and body mass index 36.  HEENT:  Eyes are  unremarkable, pupils equal and reactive to light, fundi  visualized, oral mucosa unremarkable.  NECK:  No jugular venous distention at 45 degrees, carotid upstroke  brisk and symmetrical, no bruits, no thyromegaly.  LYMPHATICS:  No cervical, axillary or inguinal adenopathy.  LUNGS:  Clear to auscultation bilaterally.  BACK:  No costovertebral tenderness.  CHEST:  Unremarkable.  HEART:  PMI not displaced or sustained, S1 and S2 within normal, no S3,  no S4, no clicks, no rubs, no murmurs.  ABDOMEN:  Obese, positive bowel sounds normal in frequency and pitch, no  bruits, rebound, guarding or midline pulsatile mass.  No hepatomegaly or  splenomegaly.  SKIN:  No rashes, no nodules.  EXTREMITIES:  2+ pulse throughout, no edema, no cyanosis, no clubbing.  NEURO:  Oriented to person, place, and time, cranial nerves II through  XII grossly intact, motor grossly intact.   EKG sinus rhythm, rate 79, left toward axis, early transition lead V2,  no acute ST-T wave changes.  ASSESSMENT/PLAN:  1. Coronary artery disease.  The patient has coronary disease as      described in his previous notes.  He has ongoing risk factors      including diabetes.  Given this and the fact that it has been 5      years since the last stress perfusion study, he is due for one of      these.  He would  be able to walk on a treadmill so he will have an      exercise Cardiolite.  Further evaluation will be based on these      results.  2. Dyslipidemia.  He is having excellent followup at [facility].  Goal being LDL less than 70 and HDL greater than 40.      He is not yet at this score, but he is been trying to get there.  3. Diabetes.  He has much better hemoglobin A1c than previous, and has      had aggressive management of this.  4. Obesity.  We discussed the need for weight loss with diet and      exercise and I have suggested the [employer].  5. Hypertension.  Blood pressure is very slightly  elevated.  However,      it is not at home and he has an accurate blood pressure cuff.  He      needs continued therapeutic lifestyle changes (CLC) to include      weight loss to achieve his target heart rate in which diabetes      would be in the 120s/70s.  6. Followup.  I will see him back in 1 year or sooner if his stress      test is abnormal or if he has any problems.      Rollene Rotunda, MD, [facility]  Electronically Signed     Rollene Rotunda, MD, [facility]  Electronically Signed   JH/MedQ  DD: 01/01/2008  DT: 01/02/2008  Job #: [id number]   cc:   Ernestina Penna, M.D.

## 2010-11-11 NOTE — Letter (Signed)
January 17, 2006     Patrica Duel, MD  [address]  [place], [place] [address]   RE:  PEDRO, WHITERS  MRN:  [id number]  /  DOB:  December 26, 1945   Dear Dr. Nobie Putnam,   I had the pleasure of seeing Mr. Sherrow for evaluation of his known coronary  disease.  I did note that he had an abnormal chest x-ray at [facility] in  June 2007 suggesting that there could be a lesion over the right first rib.  I had a followup chest x-ray performed a couple of weeks later.  I am going  to send this to you with this letter.  It described some nodularity in the  left apical region and suggested a follow up with another chest x-ray or CT.  I wonder if I could defer management of this to you.  Unless I hear back  from you further, I will assume you will be ordering his followup CTs with  chest x-rays.  Thank you for taking over this matter.    Sincerely,      Rollene Rotunda, MD, [facility]   JH/MedQ  DD:  01/17/2006  DT:  01/17/2006  Job #:  [id number]

## 2010-11-11 NOTE — Cardiovascular Report (Signed)
   NAME:  OGDEN, HANDLIN NO.:  [id number]   MEDICAL RECORD NO.:  [id number]                   PATIENT TYPE:  INP   LOCATION:  2019                                 FACILITY:  [facility]   PHYSICIAN:  Rollene Rotunda, M.D. [facility]            DATE OF BIRTH:  Dec 11, 1945   DATE OF PROCEDURE:  09/25/2002  DATE OF DISCHARGE:                              CARDIAC CATHETERIZATION   PRIMARY CARE PHYSICIAN:  Ernestina Penna, M.D.   REASON FOR PRESENTATION:  Evaluate patient with chest pain and previous  nonobstructive coronary disease (411.1).   CARDIOLOGIST:  Rollene Rotunda, M.D.   DESCRIPTION OF PROCEDURE:  Left heart catheterization was performed via the  right femoral artery.  A Smart needle was used to cannulate the femoral  artery.  The vessel was cannulated using modified Seldinger technique.  A  preformed Judkins pigtail catheter was utilized.  I did use a Judkins left  3.5 to cannulate selectively the LAD.  The patient tolerated the procedure  well and left the lab in stable condition.   RESULTS:  HEMODYNAMICS:  LV 158/25, AO153/89.   CORONARY ARTERIOGRAPHY:  Left main:  The left main coronary artery was short  and normal.   Left anterior descending:  The LAD had mid long 25% stenosis.  It was mid to  distal long, 30-40% stenosis.  The vessel was a large vessel wrapping the  apex.   Circumflex:  The circumflex coronary artery was dominant.   There was a large branching ramus intermediate which was free of disease.  The AV groove was free of disease.  There was a large obtuse  marginal/posterior lateral which was free of disease.  There was a moderate  to small posterior lateral which had a proximal 70% stenosis.  The right  coronary artery was dominant and free of high grade disease.   <LEFT VENTRICULOGRAM/>  The left ventriculogram was obtained in the RAO projection.  The ejection  fraction was approximately 65% with normal wall motion.    CONCLUSION:  1. Nonobstructive left anterior descending stenosis.  2. The circumflex posterior lateral stenosis is of questionable hemodynamic     significance.  3. The left ventricle is normal.    PLAN:  I will get a stress perfusion study to further evaluate the  hemodynamic significance of the posterior lateral branch.                                               Rollene Rotunda, M.D. [facility]    JH/MEDQ  D:  09/25/2002  T:  09/25/2002  Job:  [id number]

## 2010-11-11 NOTE — Discharge Summary (Signed)
NAME:  Marvin Frost, Marvin Frost NO.:  [id number]   MEDICAL RECORD NO.:  [id number]                   PATIENT TYPE:  INP   LOCATION:  2019                                 FACILITY:  [facility]   PHYSICIAN:  Rollene Rotunda, M.D. [facility]            DATE OF BIRTH:  1945/08/15   DATE OF ADMISSION:  09/23/2002  DATE OF DISCHARGE:  09/26/2002                           DISCHARGE SUMMARY - REFERRING   HISTORY OF PRESENT ILLNESS:  This is a pleasant 65 year old male with a  history of nonobstructive coronary artery disease from cardiac  catheterization in 2000.  He was seen by Rollene Rotunda, M.D., on September 23, 2002, for evaluation of chest pain.  The decision was made to admit the  patient for further evaluation and treatment.   PAST MEDICAL HISTORY:  As noted, the patient has a history of coronary  artery disease.  He had a cardiac catheterization in January of 2000 that  revealed a normal left main, 30% LAD, and 40% mid LAD.  The circumflex was  normal.  The right coronary artery was nondominant with an ostial 30%  lesion.  The EF was 65%.   The patient also has a history of premature atrial contractions,  hypertension, diabetes mellitus, hyperlipidemia, and gastrointestinal reflux  disease.   ALLERGIES:  The patient is intolerant to STATINS, which cause muscle pain.   PAST SURGICAL HISTORY:  Significant for hydrocele repair.   MEDICATIONS PRIOR TO ADMISSION:  1. Aspirin.  2. Prevacid.  3. Avalide.  4. Glucovance.  5. Zeta.  6. Actos.   SOCIAL HISTORY:  The patient is married.  He quit smoking approximately 19  years ago.  He works at [employer].   FAMILY HISTORY:  Positive for early coronary artery disease in his father  who died from an MI at age 36.   HOSPITAL COURSE:  As noted, this patient was admitted to [facility] for further evaluation of chest pain.  He underwent  cardiac catheterization on September 25, 2002, performed by Rollene Rotunda, M.D.  The catheterization report revealed a normal left main.  The LAD had a mid  long 25% lesion, as well as a distal long 30-40% lesion.  The circumflex  artery was dominant.  There was a large ramus intermediate which was free  from significant disease.  There was a moderate to small posterolateral  which had a 70% stenosis.  The right coronary artery was free from any high-  grade disease.  The ejection fraction was estimated to be 65%.   Dr. Antoine Poche recommended adenosine Cardiolite to further evaluate for  ischemia.  The adenosine Cardiolite was performed on September 26, 2002.  This  was negative for ischemia with an EF of 66%.  Arrangements were made to  discharge home later that evening improved and stable condition.   LABORATORY DATA:  Please see the results of  the Cardiolite as noted above.  The CBC on the day of discharge revealed a hemoglobin of 15.2, hematocrit  44, WBC 8100, and platelets 224,000.  The chemistry profile on September 26, 2002, revealed BUN 13, creatinine 1.1, potassium 3.7, and glucose 219.  The  PT and PTT at the time of admission were within normal limits, except for a  mildly elevated PTT of 38.  Cardiac enzymes were negative x 3.  A TSH was  2.290.  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   DISCHARGE MEDICATIONS:  1. Enteric-coated aspirin 325 mg daily.  2. Prevacid 30 mg each evening.  3. Avalide 300/12.5 mg daily.  4. Glucovance 5/500 mg two b.i.d. to be restarted the day after discharge.  5. Zetia 10 mg daily.  6. Actos 45 mg daily.  7. Nitroglycerin p.r.n. for chest pain.  8. The patient requested a prescription for Vicodin for back pain.  He was     told he could take one to two as needed every six hours p.r.n., #15, no     refills.   ACTIVITY:  The patient was told to avoid any strenuous activity or driving  for two days.  He was not to lift more than 10 pounds for one week.   DIET:  He was to be on a  low-salt, low-fat diabetic diet.   WOUND CARE:  He was told to call the office if he had any increased pain,  swelling, or bleeding from his groin.   FOLLOW-UP:  He is to see Rollene Rotunda, M.D., in [place], [place],  on October 15, 2002, at 3 p.m. and Ernestina Penna, M.D., as needed or as  scheduled.   PROBLEM LIST AT THE TIME OF DISCHARGE:  1. Chest pain with negative cardiac enzymes.  2. Cardiac catheterization performed on September 25, 2002.  Please see results     as noted above.  3. Adenosine Cardiolite on September 26, 2002, showing no ischemia and ejection     fraction 66%.  4. History of diabetes mellitus.  5. History of hypertension.  6. History of hyperlipidemia, but intolerant to statins.  7. Gastrointestinal reflux disease.  8. History of premature atrial contractions.  9. History of hypertension.     Delton See, P.A. [facility]                  Rollene Rotunda, M.D. [facility]    DR/MEDQ  D:  09/26/2002  T:  09/28/2002  Job:  [id number]   cc:   Ernestina Penna, M.D.  [address] [place]  [place] [address]  Fax: [phone]

## 2010-12-01 ENCOUNTER — Encounter: Payer: Self-pay | Admitting: Cardiology

## 2010-12-05 ENCOUNTER — Other Ambulatory Visit: Payer: Self-pay | Admitting: Family Medicine

## 2010-12-05 DIAGNOSIS — M5030 Other cervical disc degeneration, unspecified cervical region: Secondary | ICD-10-CM

## 2010-12-06 ENCOUNTER — Ambulatory Visit
Admission: RE | Admit: 2010-12-06 | Discharge: 2010-12-06 | Disposition: A | Discharge status: Home / Self Care | Payer: Medicare Other | Source: Ambulatory Visit | Attending: Family Medicine | Admitting: Family Medicine

## 2010-12-06 DIAGNOSIS — M5030 Other cervical disc degeneration, unspecified cervical region: Secondary | ICD-10-CM

## 2010-12-26 MED ORDER — GADOBENATE DIMEGLUMINE 529 MG/ML IV SOLN
15.0000 mL | Freq: Once | INTRAVENOUS | Status: AC | PRN
  Administered 2010-12-26: 15 mL via INTRAVENOUS

## 2011-01-04 ENCOUNTER — Ambulatory Visit: Payer: Medicare Other | Attending: Physical Medicine and Rehabilitation | Admitting: Physical Therapy

## 2011-01-04 DIAGNOSIS — R269 Unspecified abnormalities of gait and mobility: Secondary | ICD-10-CM | POA: Insufficient documentation

## 2011-01-04 DIAGNOSIS — IMO0001 Reserved for inherently not codable concepts without codable children: Secondary | ICD-10-CM | POA: Insufficient documentation

## 2011-01-04 DIAGNOSIS — R42 Dizziness and giddiness: Secondary | ICD-10-CM | POA: Insufficient documentation

## 2011-01-10 ENCOUNTER — Ambulatory Visit: Payer: Medicare Other | Admitting: *Deleted

## 2011-01-12 ENCOUNTER — Ambulatory Visit: Payer: Medicare Other | Admitting: *Deleted

## 2011-01-17 ENCOUNTER — Ambulatory Visit: Payer: Medicare Other | Admitting: Physical Therapy

## 2011-01-19 ENCOUNTER — Ambulatory Visit: Payer: Medicare Other | Admitting: Physical Therapy

## 2011-01-24 ENCOUNTER — Ambulatory Visit: Payer: Medicare Other | Attending: Physical Medicine and Rehabilitation | Admitting: *Deleted

## 2011-01-24 DIAGNOSIS — R269 Unspecified abnormalities of gait and mobility: Secondary | ICD-10-CM | POA: Insufficient documentation

## 2011-01-24 DIAGNOSIS — IMO0001 Reserved for inherently not codable concepts without codable children: Secondary | ICD-10-CM | POA: Insufficient documentation

## 2011-01-24 DIAGNOSIS — R42 Dizziness and giddiness: Secondary | ICD-10-CM | POA: Insufficient documentation

## 2011-01-26 ENCOUNTER — Ambulatory Visit: Payer: Medicare Other | Attending: Physical Medicine and Rehabilitation | Admitting: Physical Therapy

## 2011-01-26 DIAGNOSIS — R42 Dizziness and giddiness: Secondary | ICD-10-CM | POA: Insufficient documentation

## 2011-01-26 DIAGNOSIS — R269 Unspecified abnormalities of gait and mobility: Secondary | ICD-10-CM | POA: Insufficient documentation

## 2011-01-26 DIAGNOSIS — IMO0001 Reserved for inherently not codable concepts without codable children: Secondary | ICD-10-CM | POA: Insufficient documentation

## 2011-01-31 ENCOUNTER — Ambulatory Visit: Payer: Medicare Other | Admitting: Physical Therapy

## 2011-02-02 ENCOUNTER — Ambulatory Visit: Payer: Medicare Other | Admitting: Physical Therapy

## 2011-02-07 ENCOUNTER — Ambulatory Visit: Payer: Medicare Other | Admitting: *Deleted

## 2011-02-09 ENCOUNTER — Ambulatory Visit: Payer: Medicare Other | Admitting: *Deleted

## 2011-02-14 ENCOUNTER — Ambulatory Visit: Payer: Medicare Other | Admitting: *Deleted

## 2011-02-16 ENCOUNTER — Ambulatory Visit: Payer: Medicare Other | Admitting: Physical Therapy

## 2011-02-22 ENCOUNTER — Encounter (INDEPENDENT_AMBULATORY_CARE_PROVIDER_SITE_OTHER): Payer: Medicare Other | Admitting: Ophthalmology

## 2011-02-22 DIAGNOSIS — H43819 Vitreous degeneration, unspecified eye: Secondary | ICD-10-CM

## 2011-02-22 DIAGNOSIS — E11319 Type 2 diabetes mellitus with unspecified diabetic retinopathy without macular edema: Secondary | ICD-10-CM

## 2011-02-22 DIAGNOSIS — H2510 Age-related nuclear cataract, unspecified eye: Secondary | ICD-10-CM

## 2011-02-28 ENCOUNTER — Encounter: Payer: Self-pay | Admitting: Cardiology

## 2011-03-01 ENCOUNTER — Encounter: Payer: Self-pay | Admitting: Cardiology

## 2011-03-01 ENCOUNTER — Ambulatory Visit (INDEPENDENT_AMBULATORY_CARE_PROVIDER_SITE_OTHER): Payer: Medicare Other | Admitting: Cardiology

## 2011-03-01 DIAGNOSIS — E669 Obesity, unspecified: Secondary | ICD-10-CM

## 2011-03-01 DIAGNOSIS — I10 Essential (primary) hypertension: Secondary | ICD-10-CM

## 2011-03-01 DIAGNOSIS — I2510 Atherosclerotic heart disease of native coronary artery without angina pectoris: Secondary | ICD-10-CM

## 2011-03-01 DIAGNOSIS — E785 Hyperlipidemia, unspecified: Secondary | ICD-10-CM

## 2011-03-01 NOTE — Patient Instructions (Addendum)
Follow up in 1 year with Dr Hochrein.  You will receive a letter in the mail 2 months before you are due.  Please call us when you receive this letter to schedule your follow up appointment.  The current medical regimen is effective;  continue present plan and medications.  

## 2011-03-01 NOTE — Assessment & Plan Note (Signed)
I reviewed this.  It is followed closely by Dr. Christell Constant

## 2011-03-01 NOTE — Assessment & Plan Note (Signed)
The patient has no new sypmtoms.  No further cardiovascular testing is indicated.  We will continue with aggressive risk reduction and meds as listed.  He had his last stress test in 09

## 2011-03-01 NOTE — Assessment & Plan Note (Signed)
The patient understands the need to lose weight with diet and exercise. We have discussed specific strategies for this.  

## 2011-03-01 NOTE — Assessment & Plan Note (Signed)
His BP is slightly elevated in the office.  However, I reviewed other visits and it has been at or below target.  He reports that it is controlled at home.  I asked him to keep a diary and I would be happy to look at this.  The meds will not be changed today.  He needs to lose weight.

## 2011-03-01 NOTE — Progress Notes (Signed)
HPI The patient presents for follow up of nonobstructive CAD.  Since I last saw him he has done well.  The patient denies any new symptoms such as chest discomfort, neck or arm discomfort. There has been no new shortness of breath, PND or orthopnea. There have been no reported palpitations, presyncope or syncope.  He is participating and a structured exercise program.  With this he has no symptoms.  He has had trouble controlling his diatetes.  Allergies  Allergen Reactions  . Baxinets (Sep-A-Soothe)   . Biaxin   . Celebrex (Celecoxib) Nausea Only  . Statins   . Toprol Xl (Metoprolol Succinate)   . Triplex Ad Other (See Comments)    Abdominal pain and enlarged pancreas on CT? pancreatitis  . Victoza Other (See Comments)    Abdominal pain and enlarged pancreas on CT ? pancreatitis  . Welchol (Colesevelam Hcl) Other (See Comments)    constipation  . Norvasc (Amlodipine Besylate) Rash    Current Outpatient Prescriptions  Medication Sig Dispense Refill  . aspirin 81 MG tablet Take 81 mg by mouth daily.        . calcium-vitamin D (OSCAL WITH D) 500-200 MG-UNIT per tablet Take 1 tablet by mouth daily.        Marland Kitchen ezetimibe (ZETIA) 10 MG tablet Take 10 mg by mouth daily.        Marland Kitchen glimepiride (AMARYL) 4 MG tablet Take 4 mg by mouth daily before breakfast.        . glucosamine-chondroitin 500-400 MG tablet Take 1 tablet by mouth 1 dose over 46 hours.        . insulin detemir (LEVEMIR) 100 UNIT/ML injection Inject 20 Units into the skin at bedtime.        . metFORMIN (GLUCOPHAGE) 1000 MG tablet Take 1,000 mg by mouth 2 (two) times daily with a meal.        . Multiple Vitamin (MULTIVITAMIN) capsule Take 1 capsule by mouth daily.        Marland Kitchen olmesartan-hydrochlorothiazide (BENICAR HCT) 40-25 MG per tablet Take 1 tablet by mouth daily.        Marland Kitchen omega-3 acid ethyl esters (LOVAZA) 1 G capsule Take 2 g by mouth daily.        . pantoprazole (PROTONIX) 40 MG tablet Take 40 mg by mouth 2 (two) times daily.        . rosuvastatin (CRESTOR) 5 MG tablet Take 5 mg by mouth daily.          Past Medical History  Diagnosis Date  . CAD (coronary artery disease)   . Hypertension   . Diabetes mellitus   . Hyperlipemia   . GERD (gastroesophageal reflux disease)   . Obesity     Past Surgical History  Procedure Date  . Right shoulder surgery   . Hydrocele excision / repair   . Colonoscopy 11/2003    diverticulosis, no polyps  . Upper gastrointestinal endoscopy 08/19/2010    hiatal hernia 2cm, mild gastritis    ROS:  As stated in the HPI and negative for all other systems.  PHYSICAL EXAM BP 146/86  Pulse 87  Resp 18  Ht 5\' 8"  (1.727 m)  Wt 222 lb (100.699 kg)  BMI 33.76 kg/m2 GENERAL:  Well appearing HEENT:  Pupils equal round and reactive, fundi not visualized, oral mucosa unremarkable NECK:  No jugular venous distention, waveform within normal limits, carotid upstroke brisk and symmetric, no bruits, no thyromegaly LYMPHATICS:  No cervical, inguinal adenopathy LUNGS:  Clear to auscultation bilaterally BACK:  No CVA tenderness CHEST:  Unremarkable HEART:  PMI not displaced or sustained,S1 and S2 within normal limits, no S3, no S4, no clicks, no rubs, no murmurs ABD:  Flat, positive bowel sounds normal in frequency in pitch, no bruits, no rebound, no guarding, no midline pulsatile mass, no hepatomegaly, no splenomegaly, obese EXT:  2 plus pulses throughout, no edema, no cyanosis no clubbing SKIN:  No rashes no nodules NEURO:  Cranial nerves II through XII grossly intact, motor grossly intact throughout PSYCH:  Cognitively intact, oriented to person place and time  EKG: Normal sinus rhythm, rate 85, left axis views poor anterior R-wave progression, T-wave changes  ASSESSMENT AND PLAN

## 2011-04-27 ENCOUNTER — Encounter: Payer: Self-pay | Admitting: Cardiology

## 2011-07-04 ENCOUNTER — Other Ambulatory Visit: Payer: Self-pay | Admitting: Ophthalmology

## 2011-08-09 ENCOUNTER — Other Ambulatory Visit: Payer: Self-pay | Admitting: Family Medicine

## 2011-08-09 DIAGNOSIS — E785 Hyperlipidemia, unspecified: Secondary | ICD-10-CM | POA: Diagnosis not present

## 2011-08-09 DIAGNOSIS — E119 Type 2 diabetes mellitus without complications: Secondary | ICD-10-CM | POA: Diagnosis not present

## 2011-08-09 DIAGNOSIS — I10 Essential (primary) hypertension: Secondary | ICD-10-CM | POA: Diagnosis not present

## 2011-08-09 DIAGNOSIS — E559 Vitamin D deficiency, unspecified: Secondary | ICD-10-CM | POA: Diagnosis not present

## 2011-08-10 ENCOUNTER — Ambulatory Visit
Admission: RE | Admit: 2011-08-10 | Discharge: 2011-08-10 | Disposition: A | Discharge status: Home / Self Care | Payer: Medicare Other | Source: Ambulatory Visit | Attending: Family Medicine | Admitting: Family Medicine

## 2011-08-10 DIAGNOSIS — R229 Localized swelling, mass and lump, unspecified: Secondary | ICD-10-CM | POA: Diagnosis not present

## 2011-08-18 ENCOUNTER — Other Ambulatory Visit: Payer: Self-pay | Admitting: Otolaryngology

## 2011-08-18 DIAGNOSIS — R221 Localized swelling, mass and lump, neck: Secondary | ICD-10-CM

## 2011-08-18 DIAGNOSIS — R599 Enlarged lymph nodes, unspecified: Secondary | ICD-10-CM | POA: Diagnosis not present

## 2011-08-22 ENCOUNTER — Ambulatory Visit
Admission: RE | Admit: 2011-08-22 | Discharge: 2011-08-22 | Disposition: A | Discharge status: Home / Self Care | Payer: Medicare Other | Source: Ambulatory Visit | Attending: Otolaryngology | Admitting: Otolaryngology

## 2011-08-22 DIAGNOSIS — R220 Localized swelling, mass and lump, head: Secondary | ICD-10-CM | POA: Diagnosis not present

## 2011-08-22 DIAGNOSIS — R221 Localized swelling, mass and lump, neck: Secondary | ICD-10-CM

## 2011-08-22 MED ORDER — IOHEXOL 300 MG/ML  SOLN
75.0000 mL | Freq: Once | INTRAMUSCULAR | Status: AC | PRN
  Administered 2011-08-22: 75 mL via INTRAVENOUS

## 2011-08-25 DIAGNOSIS — R599 Enlarged lymph nodes, unspecified: Secondary | ICD-10-CM | POA: Diagnosis not present

## 2011-08-30 ENCOUNTER — Telehealth: Payer: Self-pay | Admitting: Cardiology

## 2011-08-30 NOTE — Telephone Encounter (Signed)
[facility] done in 2009 @ [phone] 08/30/11 emg

## 2011-08-30 NOTE — Telephone Encounter (Signed)
LOv,Cath,12 lead faxed to [facility] @ [phone] 08/30/11/KM

## 2011-08-31 DIAGNOSIS — D360 Benign neoplasm of lymph nodes: Secondary | ICD-10-CM | POA: Diagnosis not present

## 2011-08-31 DIAGNOSIS — R599 Enlarged lymph nodes, unspecified: Secondary | ICD-10-CM | POA: Diagnosis not present

## 2011-11-16 DIAGNOSIS — E559 Vitamin D deficiency, unspecified: Secondary | ICD-10-CM | POA: Diagnosis not present

## 2011-11-16 DIAGNOSIS — E785 Hyperlipidemia, unspecified: Secondary | ICD-10-CM | POA: Diagnosis not present

## 2011-11-16 DIAGNOSIS — N400 Enlarged prostate without lower urinary tract symptoms: Secondary | ICD-10-CM | POA: Diagnosis not present

## 2011-11-16 DIAGNOSIS — Z125 Encounter for screening for malignant neoplasm of prostate: Secondary | ICD-10-CM | POA: Diagnosis not present

## 2011-11-16 DIAGNOSIS — I10 Essential (primary) hypertension: Secondary | ICD-10-CM | POA: Diagnosis not present

## 2011-11-16 DIAGNOSIS — E119 Type 2 diabetes mellitus without complications: Secondary | ICD-10-CM | POA: Diagnosis not present

## 2011-11-16 DIAGNOSIS — E039 Hypothyroidism, unspecified: Secondary | ICD-10-CM | POA: Diagnosis not present

## 2011-12-14 DIAGNOSIS — L02619 Cutaneous abscess of unspecified foot: Secondary | ICD-10-CM | POA: Diagnosis not present

## 2011-12-14 DIAGNOSIS — W57XXXA Bitten or stung by nonvenomous insect and other nonvenomous arthropods, initial encounter: Secondary | ICD-10-CM | POA: Diagnosis not present

## 2011-12-14 DIAGNOSIS — T148 Other injury of unspecified body region: Secondary | ICD-10-CM | POA: Diagnosis not present

## 2011-12-18 DIAGNOSIS — E119 Type 2 diabetes mellitus without complications: Secondary | ICD-10-CM | POA: Diagnosis not present

## 2011-12-18 DIAGNOSIS — E785 Hyperlipidemia, unspecified: Secondary | ICD-10-CM | POA: Diagnosis not present

## 2012-02-22 ENCOUNTER — Encounter (INDEPENDENT_AMBULATORY_CARE_PROVIDER_SITE_OTHER): Payer: Medicare Other | Admitting: Ophthalmology

## 2012-02-22 DIAGNOSIS — E11319 Type 2 diabetes mellitus with unspecified diabetic retinopathy without macular edema: Secondary | ICD-10-CM

## 2012-02-22 DIAGNOSIS — E1139 Type 2 diabetes mellitus with other diabetic ophthalmic complication: Secondary | ICD-10-CM

## 2012-02-22 DIAGNOSIS — H35039 Hypertensive retinopathy, unspecified eye: Secondary | ICD-10-CM

## 2012-02-22 DIAGNOSIS — I10 Essential (primary) hypertension: Secondary | ICD-10-CM | POA: Diagnosis not present

## 2012-02-22 DIAGNOSIS — H3530 Unspecified macular degeneration: Secondary | ICD-10-CM | POA: Diagnosis not present

## 2012-02-22 DIAGNOSIS — H43819 Vitreous degeneration, unspecified eye: Secondary | ICD-10-CM

## 2012-02-22 DIAGNOSIS — E1165 Type 2 diabetes mellitus with hyperglycemia: Secondary | ICD-10-CM | POA: Diagnosis not present

## 2012-02-22 DIAGNOSIS — H2510 Age-related nuclear cataract, unspecified eye: Secondary | ICD-10-CM

## 2012-03-13 ENCOUNTER — Encounter: Payer: Self-pay | Admitting: Cardiology

## 2012-03-13 DIAGNOSIS — E785 Hyperlipidemia, unspecified: Secondary | ICD-10-CM | POA: Diagnosis not present

## 2012-03-13 DIAGNOSIS — E119 Type 2 diabetes mellitus without complications: Secondary | ICD-10-CM | POA: Diagnosis not present

## 2012-03-13 DIAGNOSIS — I10 Essential (primary) hypertension: Secondary | ICD-10-CM | POA: Diagnosis not present

## 2012-03-13 DIAGNOSIS — K219 Gastro-esophageal reflux disease without esophagitis: Secondary | ICD-10-CM | POA: Diagnosis not present

## 2012-03-13 DIAGNOSIS — E559 Vitamin D deficiency, unspecified: Secondary | ICD-10-CM | POA: Diagnosis not present

## 2012-03-13 DIAGNOSIS — N400 Enlarged prostate without lower urinary tract symptoms: Secondary | ICD-10-CM | POA: Diagnosis not present

## 2012-04-03 ENCOUNTER — Ambulatory Visit (INDEPENDENT_AMBULATORY_CARE_PROVIDER_SITE_OTHER): Payer: Medicare Other | Admitting: Cardiology

## 2012-04-03 ENCOUNTER — Encounter: Payer: Self-pay | Admitting: Cardiology

## 2012-04-03 VITALS — BP 140/80 | HR 71 | Ht 68.0 in | Wt 224.0 lb

## 2012-04-03 DIAGNOSIS — E785 Hyperlipidemia, unspecified: Secondary | ICD-10-CM

## 2012-04-03 DIAGNOSIS — E669 Obesity, unspecified: Secondary | ICD-10-CM

## 2012-04-03 DIAGNOSIS — I2510 Atherosclerotic heart disease of native coronary artery without angina pectoris: Secondary | ICD-10-CM

## 2012-04-03 DIAGNOSIS — I10 Essential (primary) hypertension: Secondary | ICD-10-CM | POA: Diagnosis not present

## 2012-04-03 NOTE — Progress Notes (Signed)
HPI The patient presents for follow up of nonobstructive CAD.  His last cath was 2004. His last stress test 2009. He had been exercising and dieting better the beginning of the year but fell off the wagon.  He's gained some of his weight back. His hemoglobin A1c has jumped. This is being addressed by Dr. Christell Constant. The patient denies any new cardiovascular symptoms however. The patient denies any new symptoms such as chest discomfort, neck or arm discomfort. There has been no new shortness of breath, PND or orthopnea. There have been no reported palpitations, presyncope or syncope.   Allergies  Allergen Reactions  . Baxinets (Cetylpyridinium-Benzocaine)   . Celebrex (Celecoxib) Nausea Only  . Clarithromycin   . Liraglutide Other (See Comments)    Abdominal pain and enlarged pancreas on CT ? pancreatitis  . Statins   . Toprol Xl (Metoprolol Succinate)   . Triplex Ad Other (See Comments)    Abdominal pain and enlarged pancreas on CT? pancreatitis  . Welchol (Colesevelam Hcl) Other (See Comments)    constipation  . Norvasc (Amlodipine Besylate) Rash    Current Outpatient Prescriptions  Medication Sig Dispense Refill  . aspirin 81 MG tablet Take 81 mg by mouth daily.        . calcium-vitamin D (OSCAL WITH D) 500-200 MG-UNIT per tablet Take 1 tablet by mouth daily.        Marland Kitchen ezetimibe (ZETIA) 10 MG tablet Take 10 mg by mouth daily.        Marland Kitchen glucosamine-chondroitin 500-400 MG tablet Take 1 tablet by mouth 1 dose over 46 hours.        . insulin aspart (NOVOLOG) 100 UNIT/ML injection Inject 10 Units into the skin 2 (two) times daily.      . insulin detemir (LEVEMIR) 100 UNIT/ML injection Inject 48 Units into the skin at bedtime.       . metFORMIN (GLUCOPHAGE) 1000 MG tablet Take 1,000 mg by mouth 2 (two) times daily with a meal.        . Multiple Vitamin (MULTIVITAMIN) capsule Take 1 capsule by mouth daily.        Marland Kitchen olmesartan-hydrochlorothiazide (BENICAR HCT) 40-25 MG per tablet Take 1 tablet  by mouth daily.        Marland Kitchen omega-3 acid ethyl esters (LOVAZA) 1 G capsule Take 2 g by mouth daily.        . pantoprazole (PROTONIX) 40 MG tablet Take 40 mg by mouth 2 (two) times daily.       . rosuvastatin (CRESTOR) 5 MG tablet Take 10 mg by mouth daily.         Past Medical History  Diagnosis Date  . CAD (coronary artery disease)     Nonobstructive Cath 2004  . Hypertension   . Diabetes mellitus   . Hyperlipemia   . GERD (gastroesophageal reflux disease)   . Obesity   . Pancreatitis     Past Surgical History  Procedure Date  . Right shoulder surgery   . Hydrocele excision / repair   . Colonoscopy 11/2003    diverticulosis, no polyps  . Upper gastrointestinal endoscopy 08/19/2010    hiatal hernia 2cm, mild gastritis    ROS:  As stated in the HPI and negative for all other systems.  PHYSICAL EXAM BP 140/80  Pulse 71  Ht 5\' 8"  (1.727 m)  Wt 101.606 kg (224 lb)  BMI 34.06 kg/m2 GENERAL:  Well appearing HEENT:  Pupils equal round and reactive, fundi not visualized, oral  mucosa unremarkable NECK:  No jugular venous distention, waveform within normal limits, carotid upstroke brisk and symmetric, no bruits, no thyromegaly LYMPHATICS:  No cervical, inguinal adenopathy LUNGS:  Clear to auscultation bilaterally BACK:  No CVA tenderness CHEST:  Unremarkable HEART:  PMI not displaced or sustained,S1 and S2 within normal limits, no S3, no S4, no clicks, no rubs, no murmurs ABD:  Flat, positive bowel sounds normal in frequency in pitch, no bruits, no rebound, no guarding, no midline pulsatile mass, no hepatomegaly, no splenomegaly, obese EXT:  2 plus pulses throughout, no edema, no cyanosis no clubbing   EKG: Normal sinus rhythm, rate 71, left axis views poor anterior R-wave progression, T-wave changes  ASSESSMENT AND PLAN  CAD -  The patient has no new sypmtoms. He had his last stress test in 09. I will bring the patient back for a POET (Plain Old Exercise Test). This will allow  me to screen for obstructive coronary disease, risk stratify and very importantly provide a prescription for exercise.  HYPERLIPIDEMIA -  I reviewed this. It is followed closely by Dr. Christell Constant.  His last LDL was 95 and HDL of 53. He had his medications adjusted.  OBESITY -  The patient understands the need to lose weight with diet and exercise. We have discussed specific strategies for this. I will give him more obstruction at the time of his POET  HYPERTENSION -  His BP is slightly elevated in the office. He said it is well controlled at home.  Weight loss will contribute to better control.  DIABETES - His hemoglobin A1c was apparently 8.2 which was up from less than 6 previously.  This is being addressed by Dr. Christell Constant.

## 2012-04-03 NOTE — Patient Instructions (Addendum)
The current medical regimen is effective;  continue present plan and medications.  Your physician has requested that you have an exercise tolerance test. For further information please visit [email or web address]. Please also follow instruction sheet, as given.   

## 2012-04-15 ENCOUNTER — Ambulatory Visit (INDEPENDENT_AMBULATORY_CARE_PROVIDER_SITE_OTHER): Payer: Medicare Other | Admitting: Cardiology

## 2012-04-15 DIAGNOSIS — I2510 Atherosclerotic heart disease of native coronary artery without angina pectoris: Secondary | ICD-10-CM | POA: Diagnosis not present

## 2012-04-15 NOTE — Procedures (Signed)
Exercise Treadmill Test  Pre-Exercise Testing Evaluation Rhythm: normal sinus  Rate: 75   PR:  .14 QRS:  .09  QT:  .39 QTc: .44     Test  Exercise Tolerance Test Ordering MD: Angelina Sheriff, MD  Interpreting MD: Angelina Sheriff, MD  Unique Test No: 1  Treadmill:  1  Indication for ETT: known ASHD  Contraindication to ETT: No   Stress Modality: exercise - treadmill  Cardiac Imaging Performed: non   Protocol: standard Bruce - maximal  Max BP:  185/84  Max MPHR (bpm):  152 85% MPR (bpm):  129  MPHR obtained (bpm):  157 % MPHR obtained:  103  Reached 85% MPHR (min:sec):  5:17 Total Exercise Time (min-sec):  9:00  Workload in METS:  10. Borg Scale: 15  Reason ETT Terminated:  desired heart rate attained    ST Segment Analysis At Rest: normal ST segments - no evidence of significant ST depression With Exercise: no evidence of significant ST depression  Other Information Arrhythmia:  Yes Angina during ETT:  absent (0) Quality of ETT:  diagnostic  ETT Interpretation:  normal - no evidence of ischemia by ST analysis  Comments: The patient had an good exercise tolerance.  There was no chest pain.  There was an appropriate level of dyspnea.  There were no arrhythmias (other than rare PVCs), a normal heart rate response and normal BP response.  There were no ischemic ST T wave changes and a normal heart rate recovery.  Recommendations: Negative adequate ETT.  No further testing is indicated.  Based on the above I gave the patient a prescription for exercise.

## 2012-04-18 DIAGNOSIS — Z23 Encounter for immunization: Secondary | ICD-10-CM | POA: Diagnosis not present

## 2012-06-17 DIAGNOSIS — E785 Hyperlipidemia, unspecified: Secondary | ICD-10-CM | POA: Diagnosis not present

## 2012-07-10 DIAGNOSIS — Z1212 Encounter for screening for malignant neoplasm of rectum: Secondary | ICD-10-CM | POA: Diagnosis not present

## 2012-07-10 DIAGNOSIS — E119 Type 2 diabetes mellitus without complications: Secondary | ICD-10-CM | POA: Diagnosis not present

## 2012-07-10 DIAGNOSIS — K219 Gastro-esophageal reflux disease without esophagitis: Secondary | ICD-10-CM | POA: Diagnosis not present

## 2012-09-19 ENCOUNTER — Ambulatory Visit (INDEPENDENT_AMBULATORY_CARE_PROVIDER_SITE_OTHER): Payer: Medicare Other | Admitting: Pharmacist

## 2012-09-19 VITALS — BP 122/70 | HR 78 | Ht 68.5 in | Wt 223.2 lb

## 2012-09-19 DIAGNOSIS — E119 Type 2 diabetes mellitus without complications: Secondary | ICD-10-CM

## 2012-09-19 DIAGNOSIS — E785 Hyperlipidemia, unspecified: Secondary | ICD-10-CM | POA: Diagnosis not present

## 2012-09-19 LAB — BASIC METABOLIC PANEL WITH GFR
GFR, Est African American: 76 mL/min
GFR, Est Non African American: 66 mL/min
Glucose, Bld: 133 mg/dL — ABNORMAL HIGH (ref 70–99)
Potassium: 5.6 mEq/L — ABNORMAL HIGH (ref 3.5–5.3)
Sodium: 138 mEq/L (ref 135–145)

## 2012-09-19 MED ORDER — ROSUVASTATIN CALCIUM 10 MG PO TABS: 10.0000 mg | ORAL_TABLET | Freq: Every day | ORAL | Status: DC

## 2012-09-19 MED ORDER — CANAGLIFLOZIN 300 MG PO TABS: 300.0000 mg | ORAL_TABLET | Freq: Every morning | ORAL | Status: DC

## 2012-09-19 MED ORDER — INSULIN ASPART 100 UNIT/ML ~~LOC~~ SOLN: SUBCUTANEOUS | Status: DC

## 2012-09-19 MED ORDER — INSULIN DETEMIR 100 UNIT/ML ~~LOC~~ SOLN: 48.0000 [IU] | Freq: Two times a day (BID) | SUBCUTANEOUS | Status: DC

## 2012-09-19 NOTE — Progress Notes (Signed)
Diabetes Follow-Up Visit Chief Complaint:   Chief Complaint  Patient presents with  . Diabetes     Exam Polyuria:  neg Polydipsia:  neg Polyphagia:  neg  BMI:  Body mass index is 33.45 kg/(m^2).   Weight changes:  Increase by 2#  General Appearance:  alert, oriented, no acute distress and well nourished Mood/Affect:  normal  HPI:  Patient has had diabetes for many years.  Invokana 300mg  was added to regimen in November 2013.  He is also taking metformin 1000mg  BID, Levemire 48units BID and Novolog 2 to 3 times daily to control DM. Last A1C was 7.8% (06/18/2011)  Low fat/carbohydrate diet?  Yes Nicotine Abuse?  No Medication Compliance?  Yes Exercise?  Yes - 4 miles/week on treadmill and weight training  Alcohol Abuse?  No  Home BG Monitoring:  Checking 3 times a day. Average:  140's High: 275  Low:  58 (1 hypoglycemic event occurred during sleep)   Lab Results  Component Value Date   HGBA1C 7.0 09/19/2012    No results found for this basename: MICROALBUR,  MALB24HUR    No results found for this basename: CHOL,  HDL,  LDLCALC,  LDLDIRECT,  TRIG,  CHOLHDL         Assessment: 1.  Diabetes.  Improving control as evidenced by A1C and HBG readings 2.  Blood Pressure.  Well controlled 3.  Lipids.  Labs currently pending 4.  Foot Care.  UTD 5.  Dental Care.  Last exam 04/2012 - due next May 2014 6.  Eye Care/Exam.  Last exam 01/2012  Recommendations:   1. Continue current medications for diabetes - no change in doses 2.  Eye Exam yearly and Dental Exam every 6 months. 3.  Dietary recommendations:  Continue to limit serving sizes of high CHO foods 4.  Physical Activity recommendations:  Continue to exercise regularly - goal 30 to 40 min 4 days/week 5.  Return to clinic in 3 months to see Dr. Christell Constant and 6 months with clinical pharmacist.  Orders Placed This Encounter  Procedures  . BASIC METABOLIC PANEL WITH GFR  . NMR Lipoprofile with Lipids  . POCT A1C

## 2012-09-23 LAB — NMR LIPOPROFILE WITH LIPIDS
HDL Particle Number: 33 umol/L (ref >=30.5)
HDL Size: 9.2 nm (ref >=9.2)
HDL-C: 49 mg/dL (ref >=40)
LDL (calc): 80 mg/dL (ref <100)
LDL Particle Number: 1957 nmol/L — ABNORMAL HIGH (ref <1000)
LDL Size: 20.3 nm — ABNORMAL LOW (ref >20.5)
LP-IR Score: 41 (ref <=45)
VLDL Size: 43.9 nm (ref >=46.6)

## 2012-09-25 ENCOUNTER — Telehealth: Payer: Self-pay | Admitting: Pharmacist

## 2012-09-25 DIAGNOSIS — E875 Hyperkalemia: Secondary | ICD-10-CM

## 2012-09-25 NOTE — Telephone Encounter (Signed)
LDL slightly increased - continue current lipid therapy  Potassium slightly elevated - pt advised to RTC 1-2 days to have rechecked.

## 2012-09-27 ENCOUNTER — Other Ambulatory Visit (INDEPENDENT_AMBULATORY_CARE_PROVIDER_SITE_OTHER): Payer: Medicare Other

## 2012-09-27 DIAGNOSIS — E875 Hyperkalemia: Secondary | ICD-10-CM | POA: Diagnosis not present

## 2012-09-27 LAB — BASIC METABOLIC PANEL WITH GFR
Calcium: 9.3 mg/dL (ref 8.4–10.5)
GFR, Est African American: 82 mL/min
GFR, Est Non African American: 71 mL/min
Sodium: 137 mEq/L (ref 135–145)

## 2012-09-27 NOTE — Progress Notes (Signed)
Patient came in for lab only.

## 2012-09-30 ENCOUNTER — Telehealth: Payer: Self-pay | Admitting: Pharmacist

## 2012-09-30 NOTE — Telephone Encounter (Signed)
Patient's potassium was normal. Notified via phone

## 2012-10-07 ENCOUNTER — Other Ambulatory Visit: Payer: Self-pay

## 2012-10-07 MED ORDER — ROSUVASTATIN CALCIUM 10 MG PO TABS: 10.0000 mg | ORAL_TABLET | Freq: Every day | ORAL | Status: DC

## 2012-10-07 NOTE — Telephone Encounter (Signed)
Lipids drawn in 12/13  Print Rx for mail order and have nurse call patient to pick up

## 2012-10-08 ENCOUNTER — Encounter: Payer: Self-pay | Admitting: *Deleted

## 2012-10-24 ENCOUNTER — Telehealth: Payer: Self-pay | Admitting: Pharmacist

## 2012-10-29 DIAGNOSIS — D485 Neoplasm of uncertain behavior of skin: Secondary | ICD-10-CM | POA: Diagnosis not present

## 2012-10-29 DIAGNOSIS — L570 Actinic keratosis: Secondary | ICD-10-CM | POA: Diagnosis not present

## 2012-10-29 DIAGNOSIS — L851 Acquired keratosis [keratoderma] palmaris et plantaris: Secondary | ICD-10-CM | POA: Diagnosis not present

## 2012-10-29 DIAGNOSIS — L820 Inflamed seborrheic keratosis: Secondary | ICD-10-CM | POA: Diagnosis not present

## 2012-10-29 NOTE — Telephone Encounter (Signed)
Tried to call again-LM

## 2012-10-30 NOTE — Telephone Encounter (Signed)
Patient to bring in rejection letter from insurance. Filled out paperwork for insurance determination for [employer] Insulin Pump.

## 2012-10-31 ENCOUNTER — Other Ambulatory Visit: Payer: Self-pay | Admitting: Pharmacist

## 2012-10-31 DIAGNOSIS — E119 Type 2 diabetes mellitus without complications: Secondary | ICD-10-CM

## 2012-11-04 ENCOUNTER — Other Ambulatory Visit (INDEPENDENT_AMBULATORY_CARE_PROVIDER_SITE_OTHER): Payer: Medicare Other

## 2012-11-04 DIAGNOSIS — E119 Type 2 diabetes mellitus without complications: Secondary | ICD-10-CM

## 2012-11-04 LAB — GLUCOSE, FASTING: Glucose, Fasting: 182 mg/dL — ABNORMAL HIGH (ref 70–99)

## 2012-11-04 NOTE — Progress Notes (Signed)
Patient came in for labs only.

## 2012-11-05 LAB — C-PEPTIDE: C-Peptide: 1.94 ng/mL (ref 0.80–3.90)

## 2012-11-12 ENCOUNTER — Telehealth: Payer: Self-pay | Admitting: Pharmacist

## 2012-11-12 NOTE — Telephone Encounter (Signed)
Rx for Crestor and insurance paperwork left with chart at front desk. Also left insurance determination form for insulin pump for patient to sign.  Result of labs communicated to patient.

## 2012-11-27 ENCOUNTER — Telehealth: Payer: Self-pay | Admitting: Pharmacist

## 2012-11-27 NOTE — Telephone Encounter (Signed)
Patient reports that BP has been low 90's / high 50's Recommended that he decreased Benicar HCT 40/25mg  to 1/2 tablet daily and continue to check BP.   Call if continue to have BP less than 100/60 or if BP increases to over 140/85.

## 2012-12-31 ENCOUNTER — Other Ambulatory Visit (INDEPENDENT_AMBULATORY_CARE_PROVIDER_SITE_OTHER): Payer: Medicare Other

## 2012-12-31 DIAGNOSIS — R5381 Other malaise: Secondary | ICD-10-CM | POA: Diagnosis not present

## 2012-12-31 DIAGNOSIS — E559 Vitamin D deficiency, unspecified: Secondary | ICD-10-CM

## 2012-12-31 DIAGNOSIS — E785 Hyperlipidemia, unspecified: Secondary | ICD-10-CM | POA: Diagnosis not present

## 2012-12-31 DIAGNOSIS — E119 Type 2 diabetes mellitus without complications: Secondary | ICD-10-CM | POA: Diagnosis not present

## 2012-12-31 DIAGNOSIS — I10 Essential (primary) hypertension: Secondary | ICD-10-CM | POA: Diagnosis not present

## 2012-12-31 DIAGNOSIS — E1059 Type 1 diabetes mellitus with other circulatory complications: Secondary | ICD-10-CM

## 2012-12-31 LAB — BASIC METABOLIC PANEL WITH GFR
BUN: 16 mg/dL (ref 6–23)
CO2: 26 mEq/L (ref 19–32)
Chloride: 102 mEq/L (ref 96–112)
Creat: 0.95 mg/dL (ref 0.50–1.35)
Potassium: 4.2 mEq/L (ref 3.5–5.3)

## 2012-12-31 LAB — POCT CBC
Granulocyte percent: 70.7 % (ref 37–80)
HCT, POC: 41.7 % — AB (ref 43.5–53.7)
Hemoglobin: 14.4 g/dL (ref 14.1–18.1)
Lymph, poc: 2.1 (ref 0.6–3.4)
MCH, POC: 28.6 pg (ref 27–31.2)
MCHC: 34.4 g/dL (ref 31.8–35.4)
MCV: 83 fL (ref 80–97)
MPV: 9.2 fL (ref 0–99.8)
POC Granulocyte: 6.4 (ref 2–6.9)
POC LYMPH PERCENT: 22.8 % (ref 10–50)
Platelet Count, POC: 250 K/uL (ref 142–424)
RBC: 5 M/uL (ref 4.69–6.13)
RDW, POC: 15.4 %
WBC: 9 K/uL (ref 4.6–10.2)

## 2012-12-31 LAB — HEPATIC FUNCTION PANEL
AST: 14 U/L (ref 0–37)
Albumin: 4 g/dL (ref 3.5–5.2)
Alkaline Phosphatase: 51 U/L (ref 39–117)
Indirect Bilirubin: 0.3 mg/dL (ref 0.0–0.9)
Total Bilirubin: 0.4 mg/dL (ref 0.3–1.2)

## 2012-12-31 LAB — POCT GLYCOSYLATED HEMOGLOBIN (HGB A1C): Hemoglobin A1C: 7.5

## 2012-12-31 NOTE — Progress Notes (Signed)
Patient came in for labs only.

## 2013-01-01 LAB — NMR LIPOPROFILE WITH LIPIDS
HDL Particle Number: 38.9 umol/L (ref >=30.5)
HDL Size: 8.7 nm — ABNORMAL LOW (ref >=9.2)
HDL-C: 45 mg/dL (ref >=40)
Large HDL-P: 3.6 umol/L — ABNORMAL LOW (ref >=4.8)
Large VLDL-P: 3.6 nmol/L — ABNORMAL HIGH (ref <=2.7)
Triglycerides: 184 mg/dL — ABNORMAL HIGH (ref <150)

## 2013-01-01 LAB — VITAMIN D 25 HYDROXY (VIT D DEFICIENCY, FRACTURES): Vit D, 25-Hydroxy: 49 ng/mL (ref 30–89)

## 2013-01-06 ENCOUNTER — Ambulatory Visit (INDEPENDENT_AMBULATORY_CARE_PROVIDER_SITE_OTHER): Payer: Medicare Other | Admitting: Family Medicine

## 2013-01-06 ENCOUNTER — Encounter: Payer: Self-pay | Admitting: Family Medicine

## 2013-01-06 VITALS — BP 129/75 | HR 80 | Temp 97.0°F | Ht 68.5 in | Wt 218.0 lb

## 2013-01-06 DIAGNOSIS — E785 Hyperlipidemia, unspecified: Secondary | ICD-10-CM | POA: Diagnosis not present

## 2013-01-06 DIAGNOSIS — K219 Gastro-esophageal reflux disease without esophagitis: Secondary | ICD-10-CM

## 2013-01-06 DIAGNOSIS — W57XXXA Bitten or stung by nonvenomous insect and other nonvenomous arthropods, initial encounter: Secondary | ICD-10-CM

## 2013-01-06 DIAGNOSIS — E119 Type 2 diabetes mellitus without complications: Secondary | ICD-10-CM | POA: Diagnosis not present

## 2013-01-06 DIAGNOSIS — N529 Male erectile dysfunction, unspecified: Secondary | ICD-10-CM

## 2013-01-06 DIAGNOSIS — N521 Erectile dysfunction due to diseases classified elsewhere: Secondary | ICD-10-CM

## 2013-01-06 DIAGNOSIS — I2510 Atherosclerotic heart disease of native coronary artery without angina pectoris: Secondary | ICD-10-CM | POA: Diagnosis not present

## 2013-01-06 DIAGNOSIS — I10 Essential (primary) hypertension: Secondary | ICD-10-CM

## 2013-01-06 DIAGNOSIS — N400 Enlarged prostate without lower urinary tract symptoms: Secondary | ICD-10-CM | POA: Diagnosis not present

## 2013-01-06 DIAGNOSIS — S30861A Insect bite (nonvenomous) of abdominal wall, initial encounter: Secondary | ICD-10-CM

## 2013-01-06 DIAGNOSIS — E1169 Type 2 diabetes mellitus with other specified complication: Secondary | ICD-10-CM

## 2013-01-06 LAB — PSA: PSA: 1.77 ng/mL (ref <=4.00)

## 2013-01-06 MED ORDER — DOXYCYCLINE HYCLATE 100 MG PO TABS: 100.0000 mg | ORAL_TABLET | Freq: Two times a day (BID) | ORAL | Status: DC

## 2013-01-06 NOTE — Patient Instructions (Addendum)
Fall precautions discussed Continue current meds and therapeutic lifestyle changes Use Monistat vaginal cream as directed Continue to just take one half of the Benicar HCT, but monitor blood pressure closely and if it goes back restart the whole tablet of Benicar HCT  Try the samples of medication for erectile dysfunction and let me know if they help and we will call a prescription and for this. This is Viagra 50 mg. Start by taking one half as needed.  Deer Tick Bite Deer ticks are brown arachnids (spider family) that vary in size from as small as the head of a pin to 1/4 inch (1/2 cm) diameter. They thrive in wooded areas. Deer are the preferred host of adult deer ticks. Small rodents are the host of young ticks (nymphs). When a person walks in a field or wooded area, young and adult ticks in the surrounding grass and vegetation can attach themselves to the skin. They can suck blood for hours to days if unnoticed. Ticks are found all over the [place] Some ticks carry a specific bacteria (Borrelia burgdorferi) that causes an infection called Lyme disease. The bacteria is typically passed into a person during the blood sucking process. This happens after the tick has been attached for at least a number of hours. While ticks can be found all over the [place] those carrying the bacteria that causes Lyme disease are most common in [place] and the [place]. Only a small proportion of ticks in these areas carry the Lyme disease bacteria and cause human infections. Ticks usually attach to warm spots on the body, such as the:  Head.  Back.  Neck.  Armpits.  Groin. SYMPTOMS  Most of the time, a deer tick bite will not be felt. You may or may not see the attached tick. You may notice mild irritation or redness around the bite site. If the deer tick passes the Lyme disease bacteria to a person, a round, red rash may be noticed 2 to 3 days after the bite. The rash may be clear in the middle, like a  bull's-eye or target. If not treated, other symptoms may develop several days to weeks after the onset of the rash. These symptoms may include:  New rash lesions.  Fatigue and weakness.  General ill feeling and achiness.  Chills.  Headache and neck pain.  Swollen lymph glands.  Sore muscles and joints. 5 to 15% of untreated people with Lyme disease may develop more severe illnesses after several weeks to months. This may include inflammation of the brain lining (meningitis), nerve palsies, an abnormal heartbeat, or severe muscle and joint pain and inflammation (myositis or arthritis). DIAGNOSIS   Physical exam and medical history.  Viewing the tick if it was saved for confirmation.  Blood tests (to check or confirm the presence of Lyme disease). TREATMENT  Most ticks do not carry disease. If found, an attached tick should be removed using tweezers. Tweezers should be placed under the body of the tick so it is removed by its attachment parts (pincers). If there are signs or symptoms of being sick, or Lyme disease is confirmed, medicines (antibiotics) that kill germs are usually prescribed. In more severe cases, antibiotics may be given through an intravenous (IV) access. HOME CARE INSTRUCTIONS   Always remove ticks with tweezers. Do not use petroleum jelly or other methods to kill or remove the tick. Slide the tweezers under the body and pull out as much as you can. If you are not sure what  it is, save it in a jar and show your caregiver.  Once you remove the tick, the skin will heal on its own. Wash your hands and the affected area with water and soap. You may place a bandage on the affected area.  Take medicine as directed. You may be advised to take a full course of antibiotics.  Follow up with your caregiver as recommended. FINDING OUT THE RESULTS OF YOUR TEST Not all test results are available during your visit. If your test results are not back during the visit, make an  appointment with your caregiver to find out the results. Do not assume everything is normal if you have not heard from your caregiver or the medical facility. It is important for you to follow up on all of your test results. PROGNOSIS  If Lyme disease is confirmed, early treatment with antibiotics is very effective. Following preventive guidelines is important since it is possible to get the disease more than once. PREVENTION   Wear long sleeves and long pants in wooded or grassy areas. Tuck your pants into your socks.  Use an insect repellent while hiking.  Check yourself, your children, and your pets regularly for ticks after playing outside.  Clear piles of leaves or brush from your yard. Ticks might live there. SEEK MEDICAL CARE IF:   You or your child has an oral temperature above 102 F (38.9 C).  You develop a severe headache following the bite.  You feel generally ill.  You notice a rash.  You are having trouble removing the tick.  The bite area has red skin or yellow drainage. SEEK IMMEDIATE MEDICAL CARE IF:   Your face is weak and droopy or you have other neurological symptoms.  You have severe joint pain or weakness. MAKE SURE YOU:   Understand these instructions.  Will watch your condition.  Will get help right away if you are not doing well or get worse. FOR MORE INFORMATION Centers for Disease Control and Prevention: FootballExhibition.com.br American [employer]: www.https://powers.com/ Document Released: 09/06/2009 Document Revised: 09/04/2011 Document Reviewed: 09/06/2009 [facility] Patient Information 2014 [place], [place].

## 2013-01-06 NOTE — Addendum Note (Signed)
Addended by: Bearl Mulberry on: 01/06/2013 09:15 AM   Modules accepted: Orders

## 2013-01-06 NOTE — Progress Notes (Addendum)
Subjective:    Patient ID: Marvin Frost, male    DOB: Nov 07, 1945, 67 y.o.   MRN: [id number]  HPI  Patient returns today for followup of chronic medical problems and management. These problems include diabetes, hypertension, hyperlipidemia, coronary artery disease, and gastroesophageal reflux. He sees a clinical pharmacist periodically for better management of diabetes. Also received in review of systems. Patient brings in a copy of blood pressure readings,se are good. He also has copies of blood sugar readings, the highest of which appear to be before dinner every night. Patient notes that he was having weak spells and dizziness and his blood pressure was consistently running low at this time. He discussed this with the clinical pharmacist and she recommended that he take half of the Benicar HCT instead of a whole pill, and this has helped his symptoms and his blood pressure readings have been more normal. Patient is view his eye exam in August. Patient is due to visit with a cardiologist in September. On looking at the paper chart he is due a prostate exam and PSA today . Labs were reviewed with patient, and all were good, except for the hemoglobin A1c being 7.5%. Patient does indicate that he has some ED.  Review of Systems  Constitutional: Positive for fatigue (some with hypotension).  Eyes: Negative.   Respiratory: Negative.   Cardiovascular: Positive for palpitations (occasional x years).  Gastrointestinal: Positive for constipation (slight). Negative for abdominal pain.  Endocrine: Negative.   Genitourinary: Negative.   Musculoskeletal: Positive for back pain (LBP) and arthralgias (bilateral hips and knees, hands).  Skin: Positive for wound (tick bite on lower mid abd).  Allergic/Immunologic: Negative.   Neurological: Positive for dizziness (with hypotension) and headaches (slight).  Psychiatric/Behavioral: Positive for sleep disturbance (occasional).       Objective:   Physical Exam BP  129/75  Pulse 80  Temp(Src) 97 F (36.1 C) (Oral)  Ht 5' 8.5" (1.74 m)  Wt 218 lb (98.884 kg)  BMI 32.66 kg/m2  The patient appeared well nourished and normally developed, alert and oriented to time and place. Speech, behavior and judgement appear normal. Vital signs as documented.  Head exam is unremarkable. No scleral icterus or pallor noted. TMs were normal. Mouth and throat were normal.  Neck is without jugular venous distension, thyromegally, or carotid bruits. Carotid upstrokes are brisk bilaterally. No cervical adenopathy. Lungs are clear anteriorly and posteriorly to auscultation. Normal respiratory effort. Cardiac exam reveals regular rate and rhythm at 60 per minute. First and second heart sounds normal.  No murmurs, rubs or gallops.  Abdominal exam reveals obesity, normal bowl sounds, no masses, no organomegaly and no aortic enlargement. No inguinal adenopathy. Rectal exam reveals an enlarged but smooth prostate. There was no hernia. The patient is uncircumcised. There was erythema around the glans. Extremities are nonedematous and both femoral and pedal pulses are normal. Skin without pallor or jaundice.  Warm and dry, without rash. There was a small area of erythema at waistline of the abdomen where the tick was removed  Neurologic exam reveals normal deep tendon reflexes and normal sensation. Diabetic foot exam was done today.          Assessment & Plan:  1. CAD  2. DM  3. GERD  4. HYPERLIPIDEMIA  5. HYPERTENSION  6. Tick bite of abdomen, initial encounter Prescription for doxycycline 100 mg 2 tabs 1 dose. Information about tick bite Call back if any symptoms occur  7. 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 BPH (benign prostatic hyperplasia) - PSA  Patient Instructions  Fall precautions discussed Continue current meds and therapeutic lifestyle changes Use Monistat vaginal cream as  directed Continue to just take one half of the Benicar HCT, but monitor blood pressure closely and if it goes back restart the whole tablet of Benicar HCT  Try the samples of medication for erectile dysfunction and let me know if they help and we will call a prescription and for this. This is Viagra 50 mg. Start by taking one half as needed.  Deer Tick Bite Deer ticks are brown arachnids (spider family) that vary in size from as small as the head of a pin to 1/4 inch (1/2 cm) diameter. They thrive in wooded areas. Deer are the preferred host of adult deer ticks. Small rodents are the host of young ticks (nymphs). When a person walks in a field or wooded area, young and adult ticks in the surrounding grass and vegetation can attach themselves to the skin. They can suck blood for hours to days if unnoticed. Ticks are found all over the [place] Some ticks carry a specific bacteria (Borrelia burgdorferi) that causes an infection called Lyme disease. The bacteria is typically passed into a person during the blood sucking process. This happens after the tick has been attached for at least a number of hours. While ticks can be found all over the [place] those carrying the bacteria that causes Lyme disease are most common in [place] and the [place]. Only a small proportion of ticks in these areas carry the Lyme disease bacteria and cause human infections. Ticks usually attach to warm spots on the body, such as the:  Head.  Back.  Neck.  Armpits.  Groin. SYMPTOMS  Most of the time, a deer tick bite will not be felt. You may or may not see the attached tick. You may notice mild irritation or redness around the bite site. If the deer tick passes the Lyme disease bacteria to a person, a round, red rash may be noticed 2 to 3 days after the bite. The rash may be clear in the middle, like a bull's-eye or target. If not treated, other symptoms may develop several days to weeks after the onset of the rash. These  symptoms may include:  New rash lesions.  Fatigue and weakness.  General ill feeling and achiness.  Chills.  Headache and neck pain.  Swollen lymph glands.  Sore muscles and joints. 5 to 15% of untreated people with Lyme disease may develop more severe illnesses after several weeks to months. This may include inflammation of the brain lining (meningitis), nerve palsies, an abnormal heartbeat, or severe muscle and joint pain and inflammation (myositis or arthritis). DIAGNOSIS   Physical exam and medical history.  Viewing the tick if it was saved for confirmation.  Blood tests (to check or confirm the presence of Lyme disease). TREATMENT  Most ticks do not carry disease. If found, an attached tick should be removed using tweezers. Tweezers should be placed under the body of the tick so it is removed by its attachment parts (pincers). If there are signs or symptoms of being sick, or Lyme disease is confirmed, medicines (antibiotics) that kill germs are usually prescribed. In more severe cases, antibiotics may be given through an intravenous (IV) access. HOME CARE INSTRUCTIONS   Always remove ticks with tweezers. Do not use petroleum jelly or other methods to kill or remove the tick. Slide  the tweezers under the body and pull out as much as you can. If you are not sure what it is, save it in a jar and show your caregiver.  Once you remove the tick, the skin will heal on its own. Wash your hands and the affected area with water and soap. You may place a bandage on the affected area.  Take medicine as directed. You may be advised to take a full course of antibiotics.  Follow up with your caregiver as recommended. FINDING OUT THE RESULTS OF YOUR TEST Not all test results are available during your visit. If your test results are not back during the visit, make an appointment with your caregiver to find out the results. Do not assume everything is normal if you have not heard from your  caregiver or the medical facility. It is important for you to follow up on all of your test results. PROGNOSIS  If Lyme disease is confirmed, early treatment with antibiotics is very effective. Following preventive guidelines is important since it is possible to get the disease more than once. PREVENTION   Wear long sleeves and long pants in wooded or grassy areas. Tuck your pants into your socks.  Use an insect repellent while hiking.  Check yourself, your children, and your pets regularly for ticks after playing outside.  Clear piles of leaves or brush from your yard. Ticks might live there. SEEK MEDICAL CARE IF:   You or your child has an oral temperature above 102 F (38.9 C).  You develop a severe headache following the bite.  You feel generally ill.  You notice a rash.  You are having trouble removing the tick.  The bite area has red skin or yellow drainage. SEEK IMMEDIATE MEDICAL CARE IF:   Your face is weak and droopy or you have other neurological symptoms.  You have severe joint pain or weakness. MAKE SURE YOU:   Understand these instructions.  Will watch your condition.  Will get help right away if you are not doing well or get worse. FOR MORE INFORMATION Centers for Disease Control and Prevention: FootballExhibition.com.br American Academy [employer]: www.https://powers.com/ Document Released: 09/06/2009 Document Revised: 09/04/2011 Document Reviewed: 09/06/2009 [facility] Patient Information 2014 [place], [place].

## 2013-01-28 ENCOUNTER — Telehealth: Payer: Self-pay | Admitting: *Deleted

## 2013-01-28 ENCOUNTER — Encounter: Payer: Medicare Other | Admitting: Family Medicine

## 2013-01-28 NOTE — Telephone Encounter (Signed)
Message copied by Bearl Mulberry on Tue Jan 28, 2013  2:03 PM ------      Message from: Ernestina Penna      Created: Mon Jan 06, 2013 11:04 PM       The PSA was within the normal range at 1.77---------- please review previous PSA reading one year ago in paper chart+++++++ ------

## 2013-01-28 NOTE — Telephone Encounter (Signed)
Pt notified of results Previous PSA was 1.27 from 11/16/2011

## 2013-02-21 ENCOUNTER — Ambulatory Visit (INDEPENDENT_AMBULATORY_CARE_PROVIDER_SITE_OTHER): Payer: Medicare Other | Admitting: Ophthalmology

## 2013-02-26 ENCOUNTER — Other Ambulatory Visit: Payer: Self-pay | Admitting: Pharmacist

## 2013-03-05 ENCOUNTER — Ambulatory Visit (INDEPENDENT_AMBULATORY_CARE_PROVIDER_SITE_OTHER): Payer: Medicare Other | Admitting: Ophthalmology

## 2013-03-05 DIAGNOSIS — I10 Essential (primary) hypertension: Secondary | ICD-10-CM | POA: Diagnosis not present

## 2013-03-05 DIAGNOSIS — E11319 Type 2 diabetes mellitus with unspecified diabetic retinopathy without macular edema: Secondary | ICD-10-CM | POA: Diagnosis not present

## 2013-03-05 DIAGNOSIS — H2510 Age-related nuclear cataract, unspecified eye: Secondary | ICD-10-CM

## 2013-03-05 DIAGNOSIS — H3530 Unspecified macular degeneration: Secondary | ICD-10-CM

## 2013-03-05 DIAGNOSIS — E1139 Type 2 diabetes mellitus with other diabetic ophthalmic complication: Secondary | ICD-10-CM | POA: Diagnosis not present

## 2013-03-05 DIAGNOSIS — H35039 Hypertensive retinopathy, unspecified eye: Secondary | ICD-10-CM

## 2013-03-05 DIAGNOSIS — H43819 Vitreous degeneration, unspecified eye: Secondary | ICD-10-CM

## 2013-03-08 ENCOUNTER — Other Ambulatory Visit: Payer: Self-pay | Admitting: Family Medicine

## 2013-03-18 ENCOUNTER — Other Ambulatory Visit: Payer: Self-pay | Admitting: Pharmacist

## 2013-03-20 ENCOUNTER — Other Ambulatory Visit: Payer: Self-pay | Admitting: Pharmacist

## 2013-03-26 ENCOUNTER — Ambulatory Visit (INDEPENDENT_AMBULATORY_CARE_PROVIDER_SITE_OTHER): Payer: Medicare Other

## 2013-03-26 DIAGNOSIS — Z23 Encounter for immunization: Secondary | ICD-10-CM | POA: Diagnosis not present

## 2013-04-07 ENCOUNTER — Other Ambulatory Visit: Payer: Self-pay | Admitting: Pharmacist

## 2013-04-09 ENCOUNTER — Ambulatory Visit: Payer: Medicare Other

## 2013-04-10 ENCOUNTER — Ambulatory Visit (INDEPENDENT_AMBULATORY_CARE_PROVIDER_SITE_OTHER): Payer: Medicare Other | Admitting: Pharmacist

## 2013-04-10 VITALS — BP 140/76 | HR 72 | Ht 68.5 in | Wt 219.5 lb

## 2013-04-10 DIAGNOSIS — E785 Hyperlipidemia, unspecified: Secondary | ICD-10-CM | POA: Diagnosis not present

## 2013-04-10 DIAGNOSIS — M25551 Pain in right hip: Secondary | ICD-10-CM

## 2013-04-10 DIAGNOSIS — I10 Essential (primary) hypertension: Secondary | ICD-10-CM | POA: Diagnosis not present

## 2013-04-10 DIAGNOSIS — E119 Type 2 diabetes mellitus without complications: Secondary | ICD-10-CM

## 2013-04-10 MED ORDER — INSULIN DETEMIR 100 UNIT/ML FLEXPEN: 48.0000 [IU] | PEN_INJECTOR | Freq: Two times a day (BID) | SUBCUTANEOUS | Status: DC

## 2013-04-10 MED ORDER — CANAGLIFLOZIN 300 MG PO TABS: 300.0000 mg | ORAL_TABLET | Freq: Every day | ORAL | Status: DC

## 2013-04-10 MED ORDER — ZETIA 10 MG PO TABS: 10.0000 mg | ORAL_TABLET | Freq: Every day | ORAL | Status: DC

## 2013-04-10 MED ORDER — ROSUVASTATIN CALCIUM 10 MG PO TABS: 10.0000 mg | ORAL_TABLET | Freq: Every day | ORAL | Status: DC

## 2013-04-10 MED ORDER — METFORMIN HCL 1000 MG PO TABS: 1000.0000 mg | ORAL_TABLET | Freq: Two times a day (BID) | ORAL | Status: DC

## 2013-04-10 MED ORDER — PANTOPRAZOLE SODIUM 40 MG PO TBEC: 40.0000 mg | DELAYED_RELEASE_TABLET | Freq: Two times a day (BID) | ORAL | Status: DC

## 2013-04-10 MED ORDER — OLMESARTAN MEDOXOMIL-HCTZ 40-25 MG PO TABS: 1.0000 | ORAL_TABLET | Freq: Every day | ORAL | Status: DC

## 2013-04-10 MED ORDER — INSULIN ASPART 100 UNIT/ML FLEXPEN: 10.0000 [IU] | PEN_INJECTOR | Freq: Three times a day (TID) | SUBCUTANEOUS | Status: DC

## 2013-04-10 NOTE — Progress Notes (Signed)
Diabetes Follow-Up Visit Chief Complaint:   Chief Complaint  Patient presents with  . Diabetes  . Hyperlipidemia  . Hypertension    HPI:  Patient has had diabetes for many years.  He is taking Invokana 300mg  daily, metformin 1000mg  BID, Levemir 48units BID and Novolog 2 to 3 times daily to control DM. Last A1C was 7.5% (01/03/13)  Low fat/carbohydrate diet?  Has not been following dietary recommendations as strictly the last 2 - 3 months Nicotine Abuse?  No Medication Compliance?  Yes Exercise?  Yes - but a little less due to some hip pain recently Alcohol Abuse?  No  Home BG Monitoring:  Checking 2-3 times a day. Average:  150's High: 201  Low:  58 (1 hypoglycemic event occurred during sleep)  Exam Polyuria:  neg Polydipsia:  neg Polyphagia:  neg  BMI:  Body mass index is 32.89 kg/(m^2).   Weight changes:  Increase by 2#  General Appearance:  alert, oriented, no acute distress and well nourished Mood/Affect:  normal     Lab Results  Component Value Date   HGBA1C 7.5% 12/31/2012    No results found for this basename: MICROALBUR,  [id number]    01/03/13 LDL-P = 1121 (much improved)     Tg = 184 (decreased from 207)    Assessment: 1.  Diabetes - A1c worse and not following dietary recommendations 2.  Blood Pressure.  Well controlled 3.  Lipids.  Improved  4.  Foot Care.  UTD 5.  Dental Care.  Last exam 10/2012 6.  Eye Care/Exam.  Last exam 03/2013 - Dr Ashley Royalty 7.  Hip Pain - mild pain but increasing in frequency,  Starting to limit his exercise.   Recommendations:   1. Continue current medications for diabetes - no change in doses 2.  Eye Exam yearly and Dental Exam every 6 months. 3.  Dietary recommendations:  Reviewed serving size recommendations of high CHO foods.  Patient to increase lean proteins, whole grains and vegetables.  4.  Physical Activity recommendations:  Continue to exercise regularly - goal 30 to 40 min 4 days/week 5. Patient referred to PT to  evaluate and treat hip pain.  He is to call office if not improved and will refer to [facility] for evaluation.  6.   Return to clinic in 2 months to see Dr. Christell Constant and 6 months with clinical pharmacist.   Henrene Pastor, PharmD, CPP

## 2013-04-14 ENCOUNTER — Telehealth: Payer: Self-pay | Admitting: Pharmacist

## 2013-04-14 NOTE — Telephone Encounter (Signed)
Called to let patient know mail order Rx's have been signed by Dr Christell Constant and are at front desk to pick up.

## 2013-04-17 ENCOUNTER — Ambulatory Visit: Payer: Medicare Other | Attending: Family Medicine | Admitting: Physical Therapy

## 2013-04-17 DIAGNOSIS — M25559 Pain in unspecified hip: Secondary | ICD-10-CM | POA: Diagnosis not present

## 2013-04-17 DIAGNOSIS — IMO0001 Reserved for inherently not codable concepts without codable children: Secondary | ICD-10-CM | POA: Diagnosis not present

## 2013-04-17 DIAGNOSIS — R5381 Other malaise: Secondary | ICD-10-CM | POA: Diagnosis not present

## 2013-04-22 ENCOUNTER — Ambulatory Visit: Payer: Medicare Other | Admitting: *Deleted

## 2013-04-24 ENCOUNTER — Ambulatory Visit: Payer: Medicare Other | Admitting: *Deleted

## 2013-04-29 ENCOUNTER — Ambulatory Visit: Payer: Medicare Other | Attending: Family Medicine | Admitting: Physical Therapy

## 2013-04-29 DIAGNOSIS — R5381 Other malaise: Secondary | ICD-10-CM | POA: Diagnosis not present

## 2013-04-29 DIAGNOSIS — M25559 Pain in unspecified hip: Secondary | ICD-10-CM | POA: Insufficient documentation

## 2013-04-29 DIAGNOSIS — IMO0001 Reserved for inherently not codable concepts without codable children: Secondary | ICD-10-CM | POA: Insufficient documentation

## 2013-05-01 ENCOUNTER — Ambulatory Visit: Payer: Medicare Other | Admitting: Physical Therapy

## 2013-05-06 ENCOUNTER — Ambulatory Visit: Payer: Medicare Other | Admitting: *Deleted

## 2013-05-08 ENCOUNTER — Ambulatory Visit: Payer: Medicare Other | Admitting: Physical Therapy

## 2013-05-11 ENCOUNTER — Other Ambulatory Visit: Payer: Self-pay | Admitting: Family Medicine

## 2013-05-12 NOTE — Telephone Encounter (Signed)
LAST AIC 7.5 ON 12/31/12. LAST OV 04/10/13 WITH TAMMY PLEASE PRINT RX'S FOR MAIL ORDER. THANKS.

## 2013-05-13 ENCOUNTER — Ambulatory Visit: Payer: Medicare Other | Admitting: Physical Therapy

## 2013-05-15 ENCOUNTER — Ambulatory Visit: Payer: Medicare Other | Admitting: Physical Therapy

## 2013-05-19 ENCOUNTER — Ambulatory Visit: Payer: Medicare Other | Admitting: *Deleted

## 2013-05-21 ENCOUNTER — Ambulatory Visit: Payer: Medicare Other | Admitting: Physical Therapy

## 2013-05-26 ENCOUNTER — Ambulatory Visit: Payer: Medicare Other | Attending: Family Medicine | Admitting: Physical Therapy

## 2013-05-26 DIAGNOSIS — M25559 Pain in unspecified hip: Secondary | ICD-10-CM | POA: Insufficient documentation

## 2013-05-26 DIAGNOSIS — IMO0001 Reserved for inherently not codable concepts without codable children: Secondary | ICD-10-CM | POA: Diagnosis not present

## 2013-05-26 DIAGNOSIS — R5381 Other malaise: Secondary | ICD-10-CM | POA: Diagnosis not present

## 2013-06-04 DIAGNOSIS — L820 Inflamed seborrheic keratosis: Secondary | ICD-10-CM | POA: Diagnosis not present

## 2013-06-04 DIAGNOSIS — D485 Neoplasm of uncertain behavior of skin: Secondary | ICD-10-CM | POA: Diagnosis not present

## 2013-06-04 DIAGNOSIS — L570 Actinic keratosis: Secondary | ICD-10-CM | POA: Diagnosis not present

## 2013-06-09 ENCOUNTER — Ambulatory Visit (INDEPENDENT_AMBULATORY_CARE_PROVIDER_SITE_OTHER): Payer: Medicare Other | Admitting: Family Medicine

## 2013-06-09 ENCOUNTER — Encounter: Payer: Self-pay | Admitting: Family Medicine

## 2013-06-09 VITALS — BP 125/75 | HR 92 | Temp 98.6°F | Ht 68.5 in | Wt 218.0 lb

## 2013-06-09 DIAGNOSIS — I10 Essential (primary) hypertension: Secondary | ICD-10-CM

## 2013-06-09 DIAGNOSIS — I2510 Atherosclerotic heart disease of native coronary artery without angina pectoris: Secondary | ICD-10-CM

## 2013-06-09 DIAGNOSIS — M25559 Pain in unspecified hip: Secondary | ICD-10-CM

## 2013-06-09 DIAGNOSIS — E119 Type 2 diabetes mellitus without complications: Secondary | ICD-10-CM | POA: Diagnosis not present

## 2013-06-09 DIAGNOSIS — M25551 Pain in right hip: Secondary | ICD-10-CM

## 2013-06-09 DIAGNOSIS — E559 Vitamin D deficiency, unspecified: Secondary | ICD-10-CM | POA: Diagnosis not present

## 2013-06-09 DIAGNOSIS — K219 Gastro-esophageal reflux disease without esophagitis: Secondary | ICD-10-CM

## 2013-06-09 DIAGNOSIS — E785 Hyperlipidemia, unspecified: Secondary | ICD-10-CM | POA: Diagnosis not present

## 2013-06-09 LAB — POCT CBC
Granulocyte percent: 72 %G (ref 37–80)
HCT, POC: 43.6 % (ref 43.5–53.7)
Lymph, poc: 1.9 (ref 0.6–3.4)
MCH, POC: 26.7 pg — AB (ref 27–31.2)
MCHC: 31.9 g/dL (ref 31.8–35.4)
MCV: 83.8 fL (ref 80–97)
MPV: 9.4 fL (ref 0–99.8)
POC LYMPH PERCENT: 23.3 %L (ref 10–50)
RBC: 5.2 M/uL (ref 4.69–6.13)
RDW, POC: 16.1 %
WBC: 8.1 10*3/uL (ref 4.6–10.2)

## 2013-06-09 MED ORDER — PANTOPRAZOLE SODIUM 40 MG PO TBEC: 40.0000 mg | DELAYED_RELEASE_TABLET | Freq: Two times a day (BID) | ORAL | Status: DC

## 2013-06-09 MED ORDER — METFORMIN HCL 1000 MG PO TABS: 1000.0000 mg | ORAL_TABLET | Freq: Two times a day (BID) | ORAL | Status: DC

## 2013-06-09 MED ORDER — OLMESARTAN MEDOXOMIL-HCTZ 40-25 MG PO TABS: 1.0000 | ORAL_TABLET | Freq: Every day | ORAL | Status: DC

## 2013-06-09 MED ORDER — INSULIN DETEMIR 100 UNIT/ML ~~LOC~~ SOLN: 48.0000 [IU] | Freq: Two times a day (BID) | SUBCUTANEOUS | Status: DC

## 2013-06-09 MED ORDER — INSULIN ASPART 100 UNIT/ML FLEXPEN: 10.0000 [IU] | PEN_INJECTOR | Freq: Three times a day (TID) | SUBCUTANEOUS | Status: DC

## 2013-06-09 MED ORDER — OMEGA-3-ACID ETHYL ESTERS 1 G PO CAPS: 2.0000 g | ORAL_CAPSULE | Freq: Every day | ORAL | Status: DC

## 2013-06-09 MED ORDER — ZETIA 10 MG PO TABS: 10.0000 mg | ORAL_TABLET | Freq: Every day | ORAL | Status: DC

## 2013-06-09 MED ORDER — CANAGLIFLOZIN 300 MG PO TABS: 300.0000 mg | ORAL_TABLET | Freq: Every day | ORAL | Status: DC

## 2013-06-09 MED ORDER — ROSUVASTATIN CALCIUM 10 MG PO TABS: 10.0000 mg | ORAL_TABLET | Freq: Every day | ORAL | Status: DC

## 2013-06-09 NOTE — Addendum Note (Signed)
Addended by: Magdalene River on: 06/09/2013 05:43 PM   Modules accepted: Orders

## 2013-06-09 NOTE — Progress Notes (Signed)
Subjective:    Patient ID: Marvin Frost, male    DOB: 1946/01/18, 67 y.o.   MRN: [id number]  HPI Pt here for follow up and management of chronic medical problems.       Patient Active Problem List   Diagnosis Date Noted  . Nonspecific (abnormal) findings on radiological and other examination of gastrointestinal tract 09/27/2010  . Acute pancreatitis 09/27/2010  . NAUSEA AND VOMITING 08/16/2010  . DM 03/18/2009  . HYPERLIPIDEMIA 03/18/2009  . OBESITY 03/18/2009  . HYPERTENSION 03/18/2009  . CAD 03/18/2009  . [facility] 03/18/2009  . GERD 03/18/2009   Outpatient Encounter Prescriptions as of 06/09/2013  Medication Sig  . aspirin 81 MG tablet Take 81 mg by mouth daily.    . Canagliflozin (INVOKANA) 300 MG TABS Take 1 tablet (300 mg total) by mouth daily.  . Cholecalciferol (VITAMIN D) 2000 UNITS CAPS Take 1 capsule by mouth daily.  Marland Kitchen glucosamine-chondroitin 500-400 MG tablet Take 1 tablet by mouth 1 dose over 46 hours.    . insulin aspart (NOVOLOG FLEXPEN) 100 UNIT/ML SOPN FlexPen Inject 10 Units into the skin 4 (four) times daily -  with meals and at bedtime.  . insulin detemir (LEVEMIR) 100 UNIT/ML injection Inject 0.48 mLs (48 Units total) into the skin 2 (two) times daily.  . metFORMIN (GLUCOPHAGE) 1000 MG tablet Take 1 tablet (1,000 mg total) by mouth 2 (two) times daily with a meal.  . Multiple Vitamin (MULTIVITAMIN) capsule Take 1 capsule by mouth daily.    Marland Kitchen NOVOTWIST 32G X 5 MM MISC USE FOUR TIMES A DAY WITH INSULIN PENS  . olmesartan-hydrochlorothiazide (BENICAR HCT) 40-25 MG per tablet Take 1 tablet by mouth daily.  Marland Kitchen omega-3 acid ethyl esters (LOVAZA) 1 G capsule Take 2 g by mouth daily.    . ONE TOUCH ULTRA TEST test strip USE TO CHECK BLOOD GLUCOSE UP TO FOUR TIMES A DAY  . pantoprazole (PROTONIX) 40 MG tablet Take 1 tablet (40 mg total) by mouth 2 (two) times daily.  . rosuvastatin (CRESTOR) 10 MG tablet Take 1 tablet (10 mg total) by mouth daily.  Marland Kitchen ZETIA 10 MG tablet  Take 1 tablet (10 mg total) by mouth daily.  . [DISCONTINUED] Insulin Detemir (LEVEMIR FLEXTOUCH) 100 UNIT/ML SOPN Inject 48 Units into the skin 2 (two) times daily. Please dispense flextouch pens.    Review of Systems  Constitutional: Negative.   HENT: Negative.   Eyes: Negative.   Respiratory: Negative.   Cardiovascular: Negative.   Gastrointestinal: Negative.   Endocrine: Negative.   Genitourinary: Negative.   Musculoskeletal: Positive for arthralgias (RIGHT HIP PAIN).  Skin: Negative.   Allergic/Immunologic: Negative.   Neurological: Negative.   Hematological: Negative.        Objective:   Physical Exam  Nursing note and vitals reviewed. Constitutional: He is oriented to person, place, and time. He appears well-developed and well-nourished.  Pleasant and cooperative and remains overweight  HENT:  Head: Normocephalic and atraumatic.  Right Ear: External ear normal.  Left Ear: External ear normal.  Nose: Nose normal.  Mouth/Throat: Oropharynx is clear and moist. No oropharyngeal exudate.  Eyes: Conjunctivae and EOM are normal. Pupils are equal, round, and reactive to light. Right eye exhibits no discharge. Left eye exhibits no discharge. No scleral icterus.  Neck: Normal range of motion. Neck supple. No tracheal deviation present. No thyromegaly present.  No carotid bruits  Cardiovascular: Normal rate, regular rhythm, normal heart sounds and intact distal pulses.  Exam reveals no gallop  and no friction rub.   No murmur heard. At 72 per minute  Pulmonary/Chest: Effort normal and breath sounds normal. No respiratory distress. He has no wheezes. He has no rales. He exhibits no tenderness.  No axillary nodes  Abdominal: Soft. Bowel sounds are normal. He exhibits no mass. There is no tenderness. There is no rebound and no guarding.  Obesity present  Musculoskeletal: Normal range of motion. He exhibits no edema and no tenderness.  Some right lateral hip discomfort with straight  leg raising and hip abduction  Lymphadenopathy:    He has no cervical adenopathy.  Neurological: He is alert and oriented to person, place, and time. He has normal reflexes. No cranial nerve deficit.  Skin: Skin is warm and dry. No rash noted. No erythema. No pallor.  A couple of recent biopsy sites on left chest wall from dermatology  Psychiatric: He has a normal mood and affect. His behavior is normal. Judgment and thought content normal.   BP 125/75  Pulse 92  Temp(Src) 98.6 F (37 C) (Oral)  Ht 5' 8.5" (1.74 m)  Wt 218 lb (98.884 kg)  BMI 32.66 kg/m2 Results for orders placed in visit on 06/09/13  POCT CBC      Result Value Range   WBC 8.1  4.6 - 10.2 K/uL   Lymph, poc 1.9  0.6 - 3.4   POC LYMPH PERCENT 23.3  10 - 50 %L   MID (cbc)    0 - 0.9   POC MID %    0 - 12 %M   POC Granulocyte 5.8  2 - 6.9   Granulocyte percent 72.0  37 - 80 %G   RBC 5.2  4.69 - 6.13 M/uL   Hemoglobin 13.9 (*) 14.1 - 18.1 g/dL   HCT, POC 16.1  09.6 - 53.7 %   MCV 83.8  80 - 97 fL   MCH, POC 26.7 (*) 27 - 31.2 pg   MCHC 31.9  31.8 - 35.4 g/dL   RDW, POC 04.5     Platelet Count, POC 242.0  142 - 424 K/uL   MPV 9.4  0 - 99.8 fL  POCT GLYCOSYLATED HEMOGLOBIN (HGB A1C)      Result Value Range   Hemoglobin A1C 8.1       The above labs were reviewed with the patient during his visit today.     Assessment & Plan:  1. Type II or unspecified type diabetes mellitus without mention of complication, not stated as uncontrolled - POCT CBC - POCT glycosylated hemoglobin (Hb A1C) - POCT UA - Microalbumin  2. Other and unspecified hyperlipidemia - Hepatic function panel - NMR, lipoprofile  3. Essential hypertension, benign - POCT CBC - BMP8+EGFR - POCT UA - Microalbumin  4. Unspecified vitamin D deficiency - BMP8+EGFR - Vit D  25 hydroxy (rtn osteoporosis monitoring)  5. CAD  6. HYPERTENSIOn  7. GERD  8. Right hip pain -hip pain continues patient will need right hip x-ray  Meds ordered  this encounter  Medications  . Canagliflozin (INVOKANA) 300 MG TABS    Sig: Take 1 tablet (300 mg total) by mouth daily.    Dispense:  90 tablet    Refill:  3  . insulin aspart (NOVOLOG FLEXPEN) 100 UNIT/ML SOPN FlexPen    Sig: Inject 10 Units into the skin 4 (four) times daily -  with meals and at bedtime.    Dispense:  45 mL    Refill:  3  . insulin  detemir (LEVEMIR) 100 UNIT/ML injection    Sig: Inject 0.48 mLs (48 Units total) into the skin 2 (two) times daily.    Dispense:  90 mL    Refill:  3  . metFORMIN (GLUCOPHAGE) 1000 MG tablet    Sig: Take 1 tablet (1,000 mg total) by mouth 2 (two) times daily with a meal.    Dispense:  180 tablet    Refill:  3  . olmesartan-hydrochlorothiazide (BENICAR HCT) 40-25 MG per tablet    Sig: Take 1 tablet by mouth daily.    Dispense:  90 tablet    Refill:  3  . omega-3 acid ethyl esters (LOVAZA) 1 G capsule    Sig: Take 2 capsules (2 g total) by mouth daily.    Dispense:  180 capsule    Refill:  3  . pantoprazole (PROTONIX) 40 MG tablet    Sig: Take 1 tablet (40 mg total) by mouth 2 (two) times daily.    Dispense:  180 tablet    Refill:  3  . rosuvastatin (CRESTOR) 10 MG tablet    Sig: Take 1 tablet (10 mg total) by mouth daily.    Dispense:  90 tablet    Refill:  3  . ZETIA 10 MG tablet    Sig: Take 1 tablet (10 mg total) by mouth daily.    Dispense:  90 tablet    Refill:  3   Patient Instructions  Continue current medicine Please be careful and did not put yourself at risk for falling Return to clinic next week and get your Prevnar shot plus a right hip x-ray His hip pain continues beyond the next 3-4 weeks please call back and arrange an appointment with the orthopedic surgeon Continue and resume aggressive therapeutic lifestyle changes which include diet and exercise We will call you with the results of the lab work once it is available   Nyra Capes MD

## 2013-06-09 NOTE — Patient Instructions (Signed)
Continue current medicine Please be careful and did not put yourself at risk for falling Return to clinic next week and get your Prevnar shot plus a right hip x-ray His hip pain continues beyond the next 3-4 weeks please call back and arrange an appointment with the orthopedic surgeon Continue and resume aggressive therapeutic lifestyle changes which include diet and exercise We will call you with the results of the lab work once it is available

## 2013-06-11 LAB — VITAMIN D 25 HYDROXY (VIT D DEFICIENCY, FRACTURES): Vit D, 25-Hydroxy: 32.2 ng/mL (ref 30.0–100.0)

## 2013-06-11 LAB — NMR, LIPOPROFILE
Cholesterol: 130 mg/dL (ref <200)
HDL Cholesterol by NMR: 42 mg/dL (ref >=40)
HDL Particle Number: 35.3 umol/L (ref >=30.5)
LDLC SERPL CALC-MCNC: 47 mg/dL (ref <100)
LP-IR Score: 68 — ABNORMAL HIGH (ref <=45)
Small LDL Particle Number: 758 nmol/L — ABNORMAL HIGH (ref <=527)

## 2013-06-11 LAB — BMP8+EGFR
BUN/Creatinine Ratio: 16 (ref 10–22)
BUN: 17 mg/dL (ref 8–27)
CO2: 20 mmol/L (ref 18–29)
Calcium: 9.2 mg/dL (ref 8.6–10.2)
Chloride: 98 mmol/L (ref 97–108)
GFR calc Af Amer: 82 mL/min/{1.73_m2} (ref >59)
Sodium: 140 mmol/L (ref 134–144)

## 2013-06-11 LAB — HEPATIC FUNCTION PANEL
AST: 17 IU/L (ref 0–40)
Albumin: 4.2 g/dL (ref 3.6–4.8)
Alkaline Phosphatase: 57 IU/L (ref 39–117)

## 2013-06-17 ENCOUNTER — Ambulatory Visit (INDEPENDENT_AMBULATORY_CARE_PROVIDER_SITE_OTHER): Payer: Medicare Other

## 2013-06-17 DIAGNOSIS — Z23 Encounter for immunization: Secondary | ICD-10-CM | POA: Diagnosis not present

## 2013-06-17 DIAGNOSIS — M25559 Pain in unspecified hip: Secondary | ICD-10-CM | POA: Diagnosis not present

## 2013-06-17 DIAGNOSIS — M25551 Pain in right hip: Secondary | ICD-10-CM

## 2013-06-23 ENCOUNTER — Other Ambulatory Visit: Payer: Self-pay | Admitting: *Deleted

## 2013-06-23 DIAGNOSIS — M25559 Pain in unspecified hip: Secondary | ICD-10-CM

## 2013-06-24 ENCOUNTER — Telehealth: Payer: Self-pay | Admitting: Family Medicine

## 2013-06-24 NOTE — Telephone Encounter (Signed)
dwm wants him to see dr Charlann Boxer for hip pain please set up

## 2013-06-30 DIAGNOSIS — M76899 Other specified enthesopathies of unspecified lower limb, excluding foot: Secondary | ICD-10-CM | POA: Diagnosis not present

## 2013-07-02 ENCOUNTER — Telehealth: Payer: Self-pay | Admitting: Family Medicine

## 2013-07-02 ENCOUNTER — Ambulatory Visit: Payer: Medicare Other | Admitting: Cardiology

## 2013-07-02 NOTE — Telephone Encounter (Signed)
Appt given for tomorrow per patient request

## 2013-07-03 ENCOUNTER — Ambulatory Visit (INDEPENDENT_AMBULATORY_CARE_PROVIDER_SITE_OTHER): Payer: Medicare Other | Admitting: Family Medicine

## 2013-07-03 ENCOUNTER — Encounter: Payer: Self-pay | Admitting: Family Medicine

## 2013-07-03 VITALS — BP 135/73 | HR 108 | Temp 98.6°F | Ht 68.5 in | Wt 218.0 lb

## 2013-07-03 DIAGNOSIS — L0293 Carbuncle, unspecified: Secondary | ICD-10-CM

## 2013-07-03 DIAGNOSIS — L0292 Furuncle, unspecified: Secondary | ICD-10-CM

## 2013-07-03 MED ORDER — SULFAMETHOXAZOLE-TMP DS 800-160 MG PO TABS: 1.0000 | ORAL_TABLET | Freq: Two times a day (BID) | ORAL | Status: DC

## 2013-07-03 NOTE — Progress Notes (Signed)
   Subjective:    Patient ID: Marvin Frost, male    DOB: 1946/05/14, 68 y.o.   MRN: [id number]  HPI This 68 y.o. male presents for evaluation of cyst or abscess in left groin.   Review of Systems No chest pain, SOB, HA, dizziness, vision change, N/V, diarrhea, constipation, dysuria, urinary urgency or frequency, myalgias, arthralgias or rash.     Objective:   Physical Exam  Vital signs noted  Well developed well nourished male.  HEENT - Head atraumatic Normocephalic                Eyes - PERRLA, Conjuctiva - clear Sclera- Clear EOMI Respiratory - Lungs CTA bilateral Cardiac - RRR S1 and S2 without murmur Skin - oval shaped ulcer in fold of right groin      Assessment & Plan:  Furunculosis - Plan: sulfamethoxazole-trimethoprim (BACTRIM DS) 800-160 MG per tablet Po bid x 10 days #20.  Dressing applied to groin.  Lysbeth Penner FNP

## 2013-07-03 NOTE — Patient Instructions (Signed)
Cyst Removal °Your caregiver has removed a cyst. A cyst is a sac containing a semi-solid material. Cysts may occur any place on your body. They may remain small for years or gradually get larger. A sebaceous cyst is an enlarged (dilated) sweat gland filled with old sweat (sebum). Unattended, these may become large (the size of a softball) over several years time. These are often removed for improved appearance (cosmetic) reasons or before they become infected to form an abscess. An abscess is an infected cyst. °HOME CARE INSTRUCTIONS  °· Keep your bandage clean and dry. You may change your bandage after 24 hours. If your bandage sticks, use warm water to gently loosen it. Pat the area dry with a clean towel before putting on another bandage. °· If possible, keep the area where the cyst was removed raised to relieve soreness, swelling, and promote healing. °· If you have stitches, keep them clean and dry. °· You may clean your stitches gently with a cotton swab dipped in warm soapy water. °· Do not soak the area where the cyst was removed or go swimming. You may shower. °· Do not overuse the area where your cyst was removed. °· Return in 7 days or as directed to have your stitches removed. °· Take medicines as instructed by your caregiver. °SEEK IMMEDIATE MEDICAL CARE IF:  °· An oral temperature above 102° F (38.9° C) develops, not controlled by medication. °· Blood continues to soak through the bandage. °· You have increasing pain in the area where your cyst was removed. °· You have redness, swelling, pus, a bad smell, soreness (inflammation), or red streaks coming away from the stitches. These are signs of infection. °MAKE SURE YOU:  °· Understand these instructions. °· Will watch your condition. °· Will get help right away if you are not doing well or get worse. °Document Released: 06/09/2000 Document Revised: 09/04/2011 Document Reviewed: 10/03/2007 °[employer]® Patient Information ©2014 [employer]. ° °

## 2013-07-07 ENCOUNTER — Ambulatory Visit: Payer: Medicare Other | Attending: Orthopedic Surgery | Admitting: Physical Therapy

## 2013-07-07 DIAGNOSIS — I10 Essential (primary) hypertension: Secondary | ICD-10-CM | POA: Diagnosis not present

## 2013-07-07 DIAGNOSIS — R5381 Other malaise: Secondary | ICD-10-CM | POA: Diagnosis not present

## 2013-07-07 DIAGNOSIS — IMO0001 Reserved for inherently not codable concepts without codable children: Secondary | ICD-10-CM | POA: Diagnosis not present

## 2013-07-07 DIAGNOSIS — M25559 Pain in unspecified hip: Secondary | ICD-10-CM | POA: Insufficient documentation

## 2013-07-07 DIAGNOSIS — E119 Type 2 diabetes mellitus without complications: Secondary | ICD-10-CM | POA: Insufficient documentation

## 2013-07-10 ENCOUNTER — Ambulatory Visit: Payer: Medicare Other | Admitting: Physical Therapy

## 2013-07-14 ENCOUNTER — Ambulatory Visit: Payer: Medicare Other | Admitting: Physical Therapy

## 2013-07-16 ENCOUNTER — Ambulatory Visit (INDEPENDENT_AMBULATORY_CARE_PROVIDER_SITE_OTHER): Payer: Medicare Other | Admitting: Cardiology

## 2013-07-16 ENCOUNTER — Encounter: Payer: Self-pay | Admitting: Cardiology

## 2013-07-16 ENCOUNTER — Ambulatory Visit: Payer: Medicare Other | Admitting: Physical Therapy

## 2013-07-16 VITALS — BP 127/77 | HR 89 | Ht 68.5 in | Wt 220.0 lb

## 2013-07-16 DIAGNOSIS — I2510 Atherosclerotic heart disease of native coronary artery without angina pectoris: Secondary | ICD-10-CM | POA: Diagnosis not present

## 2013-07-16 NOTE — Progress Notes (Signed)
HPI The patient presents for follow up of nonobstructive CAD.  His last cath was 2004. His last stress test 2013. He unfortunately seems to have a bursitis in his right leg. This has caused him to have some limitation in his exercise. He's still having trouble with his A1 C. However, his lipids look better than previous.. The patient denies any new cardiovascular symptoms however. The patient denies any new symptoms such as chest discomfort, neck or arm discomfort. There has been no new shortness of breath, PND or orthopnea. There have been no reported palpitations, presyncope or syncope.   Allergies  Allergen Reactions  . Baxinets [Cetylpyridinium-Benzocaine]   . Celebrex [Celecoxib] Nausea Only  . Clarithromycin   . Liraglutide Other (See Comments)    Abdominal pain and enlarged pancreas on CT ? pancreatitis  . Statins   . Toprol Xl [Metoprolol Succinate]   . Triplex Ad Other (See Comments)    Abdominal pain and enlarged pancreas on CT? pancreatitis  . Welchol [Colesevelam Hcl] Other (See Comments)    constipation  . Norvasc [Amlodipine Besylate] Rash    Current Outpatient Prescriptions  Medication Sig Dispense Refill  . aspirin 81 MG tablet Take 81 mg by mouth daily.        . Canagliflozin (INVOKANA) 300 MG TABS Take 1 tablet (300 mg total) by mouth daily.  90 tablet  3  . Cholecalciferol (VITAMIN D) 2000 UNITS CAPS Take 1 capsule by mouth daily.      Marland Kitchen glucosamine-chondroitin 500-400 MG tablet Take 1 tablet by mouth 1 dose over 46 hours.        . insulin aspart (NOVOLOG FLEXPEN) 100 UNIT/ML SOPN FlexPen Inject 10 Units into the skin 4 (four) times daily -  with meals and at bedtime.  45 mL  3  . insulin detemir (LEVEMIR) 100 UNIT/ML injection Inject 0.48 mLs (48 Units total) into the skin 2 (two) times daily.  90 mL  3  . metFORMIN (GLUCOPHAGE) 1000 MG tablet Take 1 tablet (1,000 mg total) by mouth 2 (two) times daily with a meal.  180 tablet  3  . Multiple Vitamin (MULTIVITAMIN)  capsule Take 1 capsule by mouth daily.        Marland Kitchen NOVOTWIST 32G X 5 MM MISC USE FOUR TIMES A DAY WITH INSULIN PENS  400 each  1  . olmesartan-hydrochlorothiazide (BENICAR HCT) 40-25 MG per tablet Take 1 tablet by mouth daily.  90 tablet  3  . omega-3 acid ethyl esters (LOVAZA) 1 G capsule Take 2 capsules (2 g total) by mouth daily.  180 capsule  3  . ONE TOUCH ULTRA TEST test strip USE TO CHECK BLOOD GLUCOSE UP TO FOUR TIMES A DAY  400 each  1  . pantoprazole (PROTONIX) 40 MG tablet Take 1 tablet (40 mg total) by mouth 2 (two) times daily.  180 tablet  3  . rosuvastatin (CRESTOR) 10 MG tablet Take 1 tablet (10 mg total) by mouth daily.  90 tablet  3  . ZETIA 10 MG tablet Take 1 tablet (10 mg total) by mouth daily.  90 tablet  3   No current facility-administered medications for this visit.    Past Medical History  Diagnosis Date  . CAD (coronary artery disease)     Nonobstructive Cath 2004  . Hypertension   . Diabetes mellitus   . Hyperlipemia   . GERD (gastroesophageal reflux disease)   . Obesity   . Pancreatitis     Past Surgical History  Procedure Laterality Date  . Right shoulder surgery    . Hydrocele excision / repair    . Colonoscopy  11/2003    diverticulosis, no polyps  . Upper gastrointestinal endoscopy  08/19/2010    hiatal hernia 2cm, mild gastritis    ROS:  As stated in the HPI and negative for all other systems.  PHYSICAL EXAM BP 127/77  Pulse 89  Ht 5' 8.5" (1.74 m)  Wt 220 lb (99.791 kg)  BMI 32.96 kg/m2 GENERAL:  Well appearing HEENT:  Pupils equal round and reactive, fundi not visualized, oral mucosa unremarkable NECK:  No jugular venous distention, waveform within normal limits, carotid upstroke brisk and symmetric, no bruits, no thyromegaly LYMPHATICS:  No cervical, inguinal adenopathy LUNGS:  Clear to auscultation bilaterally BACK:  No CVA tenderness CHEST:  Unremarkable HEART:  PMI not displaced or sustained,S1 and S2 within normal limits, no S3, no  S4, no clicks, no rubs, no murmurs ABD:  Flat, positive bowel sounds normal in frequency in pitch, no bruits, no rebound, no guarding, no midline pulsatile mass, no hepatomegaly, no splenomegaly, obese EXT:  2 plus pulses throughout, no edema, no cyanosis no clubbing   EKG: Normal sinus rhythm, rate 89, left axis views poor anterior R-wave progression, T-wave changes.  07/16/2013  ASSESSMENT AND PLAN  CAD -  The patient has no new sypmtoms.  No further cardiovascular testing is indicated.  We will continue with aggressive risk reduction and meds as listed.  HYPERLIPIDEMIA -  I reviewed this. It is followed closely by Dr. Laurance Flatten.    HYPERTENSION -  The blood pressure is at target. No change in medications is indicated. We will continue with therapeutic lifestyle changes (TLC).

## 2013-07-16 NOTE — Patient Instructions (Signed)
The current medical regimen is effective;  continue present plan and medications.  Follow up in 1 year with Dr Hochrein.  You will receive a letter in the mail 2 months before you are due.  Please call us when you receive this letter to schedule your follow up appointment.  

## 2013-07-21 ENCOUNTER — Ambulatory Visit: Payer: Medicare Other | Admitting: Physical Therapy

## 2013-07-23 ENCOUNTER — Ambulatory Visit: Payer: Medicare Other | Admitting: Physical Therapy

## 2013-07-28 ENCOUNTER — Ambulatory Visit: Payer: Medicare Other | Attending: Orthopedic Surgery | Admitting: Physical Therapy

## 2013-07-28 DIAGNOSIS — R5381 Other malaise: Secondary | ICD-10-CM | POA: Insufficient documentation

## 2013-07-28 DIAGNOSIS — M25559 Pain in unspecified hip: Secondary | ICD-10-CM | POA: Insufficient documentation

## 2013-07-28 DIAGNOSIS — IMO0001 Reserved for inherently not codable concepts without codable children: Secondary | ICD-10-CM | POA: Diagnosis not present

## 2013-07-31 ENCOUNTER — Ambulatory Visit: Payer: Medicare Other | Admitting: Physical Therapy

## 2013-07-31 DIAGNOSIS — IMO0001 Reserved for inherently not codable concepts without codable children: Secondary | ICD-10-CM | POA: Diagnosis not present

## 2013-07-31 DIAGNOSIS — R5381 Other malaise: Secondary | ICD-10-CM | POA: Diagnosis not present

## 2013-07-31 DIAGNOSIS — M25559 Pain in unspecified hip: Secondary | ICD-10-CM | POA: Diagnosis not present

## 2013-08-02 ENCOUNTER — Ambulatory Visit (INDEPENDENT_AMBULATORY_CARE_PROVIDER_SITE_OTHER): Payer: Medicare Other | Admitting: Family Medicine

## 2013-08-02 ENCOUNTER — Encounter: Payer: Self-pay | Admitting: Family Medicine

## 2013-08-02 VITALS — BP 140/79 | HR 102 | Temp 99.2°F | Ht 68.5 in | Wt 217.0 lb

## 2013-08-02 DIAGNOSIS — B349 Viral infection, unspecified: Secondary | ICD-10-CM

## 2013-08-02 DIAGNOSIS — L02214 Cutaneous abscess of groin: Secondary | ICD-10-CM

## 2013-08-02 DIAGNOSIS — R6889 Other general symptoms and signs: Secondary | ICD-10-CM | POA: Diagnosis not present

## 2013-08-02 DIAGNOSIS — R059 Cough, unspecified: Secondary | ICD-10-CM | POA: Diagnosis not present

## 2013-08-02 DIAGNOSIS — R05 Cough: Secondary | ICD-10-CM

## 2013-08-02 DIAGNOSIS — L03319 Cellulitis of trunk, unspecified: Secondary | ICD-10-CM

## 2013-08-02 DIAGNOSIS — L02219 Cutaneous abscess of trunk, unspecified: Secondary | ICD-10-CM | POA: Diagnosis not present

## 2013-08-02 MED ORDER — OSELTAMIVIR PHOSPHATE 75 MG PO CAPS: 75.0000 mg | ORAL_CAPSULE | Freq: Two times a day (BID) | ORAL | Status: DC

## 2013-08-02 NOTE — Progress Notes (Signed)
Subjective:    Patient ID: Marvin Frost, male    DOB: [birth year], 68 y.o.   MRN: [id number]  HPI Patient here today for flu-like symptoms.      Patient Active Problem List   Diagnosis Date Noted  . Nonspecific (abnormal) findings on radiological and other examination of gastrointestinal tract 09/27/2010  . Acute pancreatitis 09/27/2010  . NAUSEA AND VOMITING 08/16/2010  . DM 03/18/2009  . HYPERLIPIDEMIA 03/18/2009  . OBESITY 03/18/2009  . HYPERTENSION 03/18/2009  . CAD 03/18/2009  . [facility] 03/18/2009  . GERD 03/18/2009   Outpatient Encounter Prescriptions as of 08/02/2013  Medication Sig  . aspirin 81 MG tablet Take 81 mg by mouth daily.    . Canagliflozin (INVOKANA) 300 MG TABS Take 1 tablet (300 mg total) by mouth daily.  . Cholecalciferol (VITAMIN D) 2000 UNITS CAPS Take 1 capsule by mouth daily.  Marland Kitchen glucosamine-chondroitin 500-400 MG tablet Take 1 tablet by mouth 1 dose over 46 hours.    . insulin aspart (NOVOLOG FLEXPEN) 100 UNIT/ML SOPN FlexPen Inject 10 Units into the skin 4 (four) times daily -  with meals and at bedtime.  . insulin detemir (LEVEMIR) 100 UNIT/ML injection Inject 0.48 mLs (48 Units total) into the skin 2 (two) times daily.  . metFORMIN (GLUCOPHAGE) 1000 MG tablet Take 1 tablet (1,000 mg total) by mouth 2 (two) times daily with a meal.  . Multiple Vitamin (MULTIVITAMIN) capsule Take 1 capsule by mouth daily.    Marland Kitchen NOVOTWIST 32G X 5 MM MISC USE FOUR TIMES A DAY WITH INSULIN PENS  . olmesartan-hydrochlorothiazide (BENICAR HCT) 40-25 MG per tablet Take 1 tablet by mouth daily.  Marland Kitchen omega-3 acid ethyl esters (LOVAZA) 1 G capsule Take 2 capsules (2 g total) by mouth daily.  . ONE TOUCH ULTRA TEST test strip USE TO CHECK BLOOD GLUCOSE UP TO FOUR TIMES A DAY  . pantoprazole (PROTONIX) 40 MG tablet Take 1 tablet (40 mg total) by mouth 2 (two) times daily.  . rosuvastatin (CRESTOR) 10 MG tablet Take 1 tablet (10 mg total) by mouth daily.  Marland Kitchen ZETIA 10 MG tablet Take 1  tablet (10 mg total) by mouth daily.    Review of Systems  Constitutional: Positive for fever and chills.  HENT: Positive for congestion and sinus pressure. Negative for ear pain and sore throat.   Eyes: Negative.   Respiratory: Positive for cough.   Cardiovascular: Negative.   Gastrointestinal: Positive for nausea.  Endocrine: Negative.   Genitourinary: Negative.   Musculoskeletal: Positive for myalgias.  Skin: Negative.        Check place on left side low abdomen  Allergic/Immunologic: Negative.   Neurological: Positive for headaches.  Hematological: Negative.   Psychiatric/Behavioral: Negative.        Objective:   Physical Exam  Nursing note and vitals reviewed. Constitutional: He is oriented to person, place, and time. He appears well-developed and well-nourished. No distress.  HENT:  Head: Normocephalic and atraumatic.  Right Ear: External ear normal.  Left Ear: External ear normal.  Nose: Nose normal.  Mouth/Throat: Oropharynx is clear and moist. No oropharyngeal exudate.  Eyes: Conjunctivae and EOM are normal. Pupils are equal, round, and reactive to light. Right eye exhibits no discharge. Left eye exhibits no discharge. No scleral icterus.  Neck: Normal range of motion. Neck supple. No thyromegaly present.  Cardiovascular: Normal rate, regular rhythm, normal heart sounds and intact distal pulses.  Exam reveals no gallop and no friction rub.   No murmur heard.  Pulmonary/Chest: Effort normal and breath sounds normal. No respiratory distress. He has no wheezes. He has no rales. He exhibits no tenderness.  Abdominal: Soft. Bowel sounds are normal. He exhibits no mass. There is no tenderness. There is no rebound and no guarding.  Musculoskeletal: Normal range of motion. He exhibits no edema.  Lymphadenopathy:    He has no cervical adenopathy.  Neurological: He is alert and oriented to person, place, and time.  Skin: Skin is warm and dry. No rash noted. No erythema. No  pallor.  Patient does have a small left groin abscess which has no erythema and at the current time no drainage. Versus a small ulcer crater in this area.  Psychiatric: He has a normal mood and affect. His behavior is normal. Judgment and thought content normal.   BP 140/79  Pulse 102  Temp(Src) 99.2 F (37.3 C) (Oral)  Ht 5' 8.5" (1.74 m)  Wt 217 lb (98.431 kg)  BMI 32.51 kg/m2        Assessment & Plan:   1. Flu-like symptoms - POCT Influenza A/B - oseltamivir (TAMIFLU) 75 MG capsule; Take 1 capsule (75 mg total) by mouth 2 (two) times daily.  Dispense: 10 capsule; Refill: 0  2. Viral syndrome - oseltamivir (TAMIFLU) 75 MG capsule; Take 1 capsule (75 mg total) by mouth 2 (two) times daily.  Dispense: 10 capsule; Refill: 0  3. Abscess of left groin -Return to clinic in one to 2 weeks and attempt to get a culture and sensitivity from the drainage  Patient Instructions  Clear liquids for 24 hours (like 7-Up, ginger ale, Sprite, Jello, frozen pops) Full liquids the second 24-hours (like potato soup, tomato soup, chicken noodle soup) Bland diet the third 24-hours (boiled and baked foods, no fried or greasy foods) Avoid milk, cheese, ice cream and dairy products for 72 hours. Avoid caffeine (cola drinks, coffee, tea, Mountain Dew, Mellow Yellow) Take in small amounts, but frequently. Tylenol and/or Advil as needed for aches pains and fever  Return to clinic in one to 2 weeks and attempt to get a culture of the groin abscess  Take prescribed medication given today on a regular basis until completed   Arrie Senate MD

## 2013-08-02 NOTE — Patient Instructions (Signed)
Clear liquids for 24 hours (like 7-Up, ginger ale, Sprite, Jello, frozen pops) Full liquids the second 24-hours (like potato soup, tomato soup, chicken noodle soup) Bland diet the third 24-hours (boiled and baked foods, no fried or greasy foods) Avoid milk, cheese, ice cream and dairy products for 72 hours. Avoid caffeine (cola drinks, coffee, tea, Mountain Dew, Mellow Yellow) Take in small amounts, but frequently. Tylenol and/or Advil as needed for aches pains and fever  Return to clinic in one to 2 weeks and attempt to get a culture of the groin abscess  Take prescribed medication given today on a regular basis until completed

## 2013-08-04 ENCOUNTER — Ambulatory Visit: Payer: Medicare Other | Admitting: Physical Therapy

## 2013-08-04 DIAGNOSIS — R5381 Other malaise: Secondary | ICD-10-CM | POA: Diagnosis not present

## 2013-08-04 DIAGNOSIS — IMO0001 Reserved for inherently not codable concepts without codable children: Secondary | ICD-10-CM | POA: Diagnosis not present

## 2013-08-04 DIAGNOSIS — M25559 Pain in unspecified hip: Secondary | ICD-10-CM | POA: Diagnosis not present

## 2013-08-06 ENCOUNTER — Ambulatory Visit: Payer: Medicare Other | Admitting: Physical Therapy

## 2013-08-06 DIAGNOSIS — M25559 Pain in unspecified hip: Secondary | ICD-10-CM | POA: Diagnosis not present

## 2013-08-06 DIAGNOSIS — IMO0001 Reserved for inherently not codable concepts without codable children: Secondary | ICD-10-CM | POA: Diagnosis not present

## 2013-08-06 DIAGNOSIS — R5381 Other malaise: Secondary | ICD-10-CM | POA: Diagnosis not present

## 2013-08-11 ENCOUNTER — Ambulatory Visit: Payer: Medicare Other | Admitting: Family Medicine

## 2013-08-14 ENCOUNTER — Encounter: Payer: Self-pay | Admitting: Family Medicine

## 2013-08-14 ENCOUNTER — Ambulatory Visit (INDEPENDENT_AMBULATORY_CARE_PROVIDER_SITE_OTHER): Payer: Medicare Other | Admitting: Family Medicine

## 2013-08-14 VITALS — BP 135/72 | HR 86 | Temp 97.9°F | Ht 68.5 in | Wt 219.0 lb

## 2013-08-14 DIAGNOSIS — S31104A Unspecified open wound of abdominal wall, left lower quadrant without penetration into peritoneal cavity, initial encounter: Secondary | ICD-10-CM

## 2013-08-14 DIAGNOSIS — I2510 Atherosclerotic heart disease of native coronary artery without angina pectoris: Secondary | ICD-10-CM

## 2013-08-14 DIAGNOSIS — S31109A Unspecified open wound of abdominal wall, unspecified quadrant without penetration into peritoneal cavity, initial encounter: Secondary | ICD-10-CM | POA: Diagnosis not present

## 2013-08-14 MED ORDER — CIPROFLOXACIN HCL 500 MG PO TABS: 500.0000 mg | ORAL_TABLET | Freq: Two times a day (BID) | ORAL | Status: DC

## 2013-08-14 NOTE — Progress Notes (Signed)
Subjective:    Patient ID: Marvin Frost, male    DOB: Dec 14, 1945, 68 y.o.   MRN: [id number]  HPI Patient here today for 1 week recheck on wound near left groin.     Patient Active Problem List   Diagnosis Date Noted  . Nonspecific (abnormal) findings on radiological and other examination of gastrointestinal tract 09/27/2010  . Acute pancreatitis 09/27/2010  . NAUSEA AND VOMITING 08/16/2010  . DM 03/18/2009  . HYPERLIPIDEMIA 03/18/2009  . OBESITY 03/18/2009  . HYPERTENSION 03/18/2009  . CAD 03/18/2009  . [facility] 03/18/2009  . GERD 03/18/2009   Outpatient Encounter Prescriptions as of 08/14/2013  Medication Sig  . aspirin 81 MG tablet Take 81 mg by mouth daily.    . Canagliflozin (INVOKANA) 300 MG TABS Take 1 tablet (300 mg total) by mouth daily.  . Cholecalciferol (VITAMIN D) 2000 UNITS CAPS Take 1 capsule by mouth daily.  Marland Kitchen glucosamine-chondroitin 500-400 MG tablet Take 1 tablet by mouth 1 dose over 46 hours.    . insulin aspart (NOVOLOG FLEXPEN) 100 UNIT/ML SOPN FlexPen Inject 10 Units into the skin 4 (four) times daily -  with meals and at bedtime.  . insulin detemir (LEVEMIR) 100 UNIT/ML injection Inject 0.48 mLs (48 Units total) into the skin 2 (two) times daily.  . metFORMIN (GLUCOPHAGE) 1000 MG tablet Take 1 tablet (1,000 mg total) by mouth 2 (two) times daily with a meal.  . Multiple Vitamin (MULTIVITAMIN) capsule Take 1 capsule by mouth daily.    Marland Kitchen NOVOTWIST 32G X 5 MM MISC USE FOUR TIMES A DAY WITH INSULIN PENS  . olmesartan-hydrochlorothiazide (BENICAR HCT) 40-25 MG per tablet Take 1 tablet by mouth daily.  Marland Kitchen omega-3 acid ethyl esters (LOVAZA) 1 G capsule Take 2 capsules (2 g total) by mouth daily.  . ONE TOUCH ULTRA TEST test strip USE TO CHECK BLOOD GLUCOSE UP TO FOUR TIMES A DAY  . pantoprazole (PROTONIX) 40 MG tablet Take 1 tablet (40 mg total) by mouth 2 (two) times daily.  . rosuvastatin (CRESTOR) 10 MG tablet Take 1 tablet (10 mg total) by mouth daily.  Marland Kitchen ZETIA 10  MG tablet Take 1 tablet (10 mg total) by mouth daily.  . [DISCONTINUED] oseltamivir (TAMIFLU) 75 MG capsule Take 1 capsule (75 mg total) by mouth 2 (two) times daily.    Review of Systems  Constitutional: Negative.   HENT: Negative.   Eyes: Negative.   Respiratory: Negative.   Cardiovascular: Negative.   Gastrointestinal: Negative.   Endocrine: Negative.   Genitourinary: Negative.   Musculoskeletal: Negative.   Skin: Negative.        Sore to left groin area  Allergic/Immunologic: Negative.   Neurological: Negative.   Hematological: Negative.   Psychiatric/Behavioral: Negative.        Objective:   Physical Exam  Constitutional: He is oriented to person, place, and time. He appears well-developed and well-nourished. No distress.  HENT:  Head: Normocephalic.  Eyes: Conjunctivae and EOM are normal. Pupils are equal, round, and reactive to light. Right eye exhibits no discharge. Left eye exhibits no discharge. No scleral icterus.  Neck: Normal range of motion.  Abdominal:  Draining wound in the left groin  Musculoskeletal: Normal range of motion.  Neurological: He is alert and oriented to person, place, and time.  Skin: Skin is warm and dry. No rash noted. No erythema.  Psychiatric: He has a normal mood and affect. His behavior is normal. Judgment and thought content normal.   BP 135/72  Pulse 86  Temp(Src) 97.9 F (36.6 C) (Oral)  Ht 5' 8.5" (1.74 m)  Wt 219 lb (99.338 kg)  BMI 32.81 kg/m2  A culture was done from the clear drainage of the wound in the left growing      Assessment & Plan:  1. Non-healing left groin open wound - ciprofloxacin (CIPRO) 500 MG tablet; Take 1 tablet (500 mg total) by mouth 2 (two) times daily.  Dispense: 20 tablet; Refill: 0  Patient Instructions  Continue to keep the area of infection cleaned out well with warm soapy water and peroxide. Take antibiotic as directed We will call you with the results of the culture that was taken once  those results are available   Arrie Senate MD

## 2013-08-14 NOTE — Patient Instructions (Signed)
Continue to keep the area of infection cleaned out well with warm soapy water and peroxide. Take antibiotic as directed We will call you with the results of the culture that was taken once those results are available

## 2013-08-16 LAB — AEROBIC CULTURE

## 2013-08-18 ENCOUNTER — Telehealth: Payer: Self-pay | Admitting: Family Medicine

## 2013-08-18 DIAGNOSIS — S31103A Unspecified open wound of abdominal wall, right lower quadrant without penetration into peritoneal cavity, initial encounter: Secondary | ICD-10-CM

## 2013-08-18 NOTE — Telephone Encounter (Signed)
Patient aware.

## 2013-08-25 DIAGNOSIS — M76899 Other specified enthesopathies of unspecified lower limb, excluding foot: Secondary | ICD-10-CM | POA: Diagnosis not present

## 2013-09-02 ENCOUNTER — Ambulatory Visit: Payer: Medicare Other | Attending: Orthopedic Surgery | Admitting: Physical Therapy

## 2013-09-02 DIAGNOSIS — IMO0001 Reserved for inherently not codable concepts without codable children: Secondary | ICD-10-CM | POA: Diagnosis not present

## 2013-09-02 DIAGNOSIS — R5381 Other malaise: Secondary | ICD-10-CM | POA: Insufficient documentation

## 2013-09-02 DIAGNOSIS — M25559 Pain in unspecified hip: Secondary | ICD-10-CM | POA: Diagnosis not present

## 2013-09-02 NOTE — Addendum Note (Signed)
Addended by: Zannie Cove on: 09/02/2013 08:29 AM   Modules accepted: Orders

## 2013-09-04 ENCOUNTER — Ambulatory Visit: Payer: Medicare Other | Admitting: Physical Therapy

## 2013-09-08 ENCOUNTER — Encounter: Payer: Self-pay | Admitting: *Deleted

## 2013-09-09 ENCOUNTER — Ambulatory Visit: Payer: Medicare Other | Admitting: Physical Therapy

## 2013-09-10 ENCOUNTER — Ambulatory Visit (INDEPENDENT_AMBULATORY_CARE_PROVIDER_SITE_OTHER): Payer: Medicare Other | Admitting: Family Medicine

## 2013-09-10 ENCOUNTER — Encounter: Payer: Self-pay | Admitting: Family Medicine

## 2013-09-10 ENCOUNTER — Ambulatory Visit (INDEPENDENT_AMBULATORY_CARE_PROVIDER_SITE_OTHER): Payer: Medicare Other

## 2013-09-10 VITALS — BP 134/74 | HR 79 | Temp 97.2°F | Ht 68.5 in | Wt 219.0 lb

## 2013-09-10 DIAGNOSIS — K219 Gastro-esophageal reflux disease without esophagitis: Secondary | ICD-10-CM | POA: Diagnosis not present

## 2013-09-10 DIAGNOSIS — I10 Essential (primary) hypertension: Secondary | ICD-10-CM

## 2013-09-10 DIAGNOSIS — E119 Type 2 diabetes mellitus without complications: Secondary | ICD-10-CM | POA: Diagnosis not present

## 2013-09-10 DIAGNOSIS — E559 Vitamin D deficiency, unspecified: Secondary | ICD-10-CM | POA: Diagnosis not present

## 2013-09-10 DIAGNOSIS — E785 Hyperlipidemia, unspecified: Secondary | ICD-10-CM | POA: Diagnosis not present

## 2013-09-10 DIAGNOSIS — I2510 Atherosclerotic heart disease of native coronary artery without angina pectoris: Secondary | ICD-10-CM | POA: Diagnosis not present

## 2013-09-10 LAB — POCT CBC
Granulocyte percent: 72.4 %G (ref 37–80)
HCT, POC: 43.8 % (ref 43.5–53.7)
Hemoglobin: 13.6 g/dL — AB (ref 14.1–18.1)
LYMPH, POC: 1.6 (ref 0.6–3.4)
MCH: 26.4 pg — AB (ref 27–31.2)
MCHC: 31.1 g/dL — AB (ref 31.8–35.4)
MCV: 84.7 fL (ref 80–97)
MPV: 9 fL (ref 0–99.8)
PLATELET COUNT, POC: 236 10*3/uL (ref 142–424)
POC Granulocyte: 5.4 (ref 2–6.9)
POC LYMPH %: 21.2 % (ref 10–50)
RBC: 5.2 M/uL (ref 4.69–6.13)
RDW, POC: 15.2 %
WBC: 7.4 10*3/uL (ref 4.6–10.2)

## 2013-09-10 LAB — POCT GLYCOSYLATED HEMOGLOBIN (HGB A1C): Hemoglobin A1C: 8.5

## 2013-09-10 NOTE — Patient Instructions (Addendum)
Medicare Annual Wellness Visit  [facility] and the medical providers at [facility] strive to bring you the best medical care.  In doing so we not only want to address your current medical conditions and concerns but also to detect new conditions early and prevent illness, disease and health-related problems.    Medicare offers a yearly Wellness Visit which allows our clinical staff to assess your need for preventative services including immunizations, lifestyle education, counseling to decrease risk of preventable diseases and screening for fall risk and other medical concerns.    This visit is provided free of charge (no copay) for all Medicare recipients. The clinical pharmacists at [facility] have begun to conduct these Wellness Visits which will also include a thorough review of all your medications.    As you primary medical provider recommend that you make an appointment for your Annual Wellness Visit if you have not done so already this year.  You may set up this appointment before you leave today or you may call back ([phone]) and schedule an appointment.  Please make sure when you call that you mention that you are scheduling your Annual Wellness Visit with the clinical pharmacist so that the appointment may be made for the proper length of time.     Continue current medications. Continue good therapeutic lifestyle changes which include good diet and exercise. Fall precautions discussed with patient. If an FOBT was given today- please return it to our front desk. If you are over 75 years old - you may need Prevnar 34 or the adult Pneumonia vaccine.  We will call you with the results of the chest x-ray lab work and fecal occult blood test when those results are available please  Please discuss with the orthopedist how and when you can become more active and work on your weight.

## 2013-09-10 NOTE — Progress Notes (Addendum)
Subjective:    Patient ID: Marvin Frost, male    DOB: 09-20-1945, 68 y.o.   MRN: [id number]  HPI Pt here for follow up and management of chronic medical problems. The patient continues to have the area of cellulitis and has an appointment to see the dermatologist in April. He has been seeing the orthopedist for bursitis in the right hip and he is currently getting physical therapy. The hip discomfort has limited his physical activity which also impacts his blood sugar.        Patient Active Problem List   Diagnosis Date Noted  . Nonspecific (abnormal) findings on radiological and other examination of gastrointestinal tract 09/27/2010  . Acute pancreatitis 09/27/2010  . NAUSEA AND VOMITING 08/16/2010  . DM 03/18/2009  . HYPERLIPIDEMIA 03/18/2009  . OBESITY 03/18/2009  . HYPERTENSION 03/18/2009  . CAD 03/18/2009  . [facility] 03/18/2009  . GERD 03/18/2009   Outpatient Encounter Prescriptions as of 09/10/2013  Medication Sig  . aspirin 81 MG tablet Take 81 mg by mouth daily.    . Canagliflozin (INVOKANA) 300 MG TABS Take 1 tablet (300 mg total) by mouth daily.  . Cholecalciferol (VITAMIN D) 2000 UNITS CAPS Take 1 capsule by mouth daily.  Marland Kitchen glucosamine-chondroitin 500-400 MG tablet Take 1 tablet by mouth 1 dose over 46 hours.    . insulin aspart (NOVOLOG FLEXPEN) 100 UNIT/ML SOPN FlexPen Inject 10 Units into the skin 4 (four) times daily -  with meals and at bedtime.  . insulin detemir (LEVEMIR) 100 UNIT/ML injection Inject 0.48 mLs (48 Units total) into the skin 2 (two) times daily.  . metFORMIN (GLUCOPHAGE) 1000 MG tablet Take 1 tablet (1,000 mg total) by mouth 2 (two) times daily with a meal.  . Multiple Vitamin (MULTIVITAMIN) capsule Take 1 capsule by mouth daily.    Marland Kitchen NOVOTWIST 32G X 5 MM MISC USE FOUR TIMES A DAY WITH INSULIN PENS  . olmesartan-hydrochlorothiazide (BENICAR HCT) 40-25 MG per tablet Take 1 tablet by mouth daily.  Marland Kitchen omega-3 acid ethyl esters (LOVAZA) 1 G capsule Take 2  capsules (2 g total) by mouth daily.  . ONE TOUCH ULTRA TEST test strip USE TO CHECK BLOOD GLUCOSE UP TO FOUR TIMES A DAY  . pantoprazole (PROTONIX) 40 MG tablet Take 1 tablet (40 mg total) by mouth 2 (two) times daily.  . rosuvastatin (CRESTOR) 10 MG tablet Take 1 tablet (10 mg total) by mouth daily.  Marland Kitchen ZETIA 10 MG tablet Take 1 tablet (10 mg total) by mouth daily.  . [DISCONTINUED] ciprofloxacin (CIPRO) 500 MG tablet Take 1 tablet (500 mg total) by mouth 2 (two) times daily.    Review of Systems  Constitutional: Negative.   HENT: Negative.   Eyes: Negative.   Respiratory: Negative.   Cardiovascular: Negative.   Gastrointestinal: Negative.   Endocrine: Negative.   Genitourinary: Negative.   Musculoskeletal: Negative.   Skin: Negative.        Still has place on left hip/groin - going to [place] in April.   Allergic/Immunologic: Negative.   Neurological: Negative.   Hematological: Negative.   Psychiatric/Behavioral: Negative.        Objective:   Physical Exam  Nursing note and vitals reviewed. Constitutional: He is oriented to person, place, and time. He appears well-developed and well-nourished. No distress.  HENT:  Head: Normocephalic and atraumatic.  Right Ear: External ear normal.  Left Ear: External ear normal.  Nose: Nose normal.  Mouth/Throat: Oropharynx is clear and moist. No oropharyngeal exudate.  Eyes: Conjunctivae and EOM are normal. Pupils are equal, round, and reactive to light. Right eye exhibits no discharge. Left eye exhibits no discharge. No scleral icterus.  Neck: Normal range of motion. Neck supple. No tracheal deviation present. No thyromegaly present.  Cardiovascular: Normal rate, regular rhythm, normal heart sounds and intact distal pulses.  Exam reveals no gallop and no friction rub.   No murmur heard. At 72 per minute  Pulmonary/Chest: Effort normal and breath sounds normal. No respiratory distress. He has no wheezes. He has no rales. He exhibits no  tenderness.  No axillary adenopathy  Abdominal: Soft. Bowel sounds are normal. He exhibits no mass. There is no tenderness. There is no rebound and no guarding.  Obesity without tenderness  Musculoskeletal: Normal range of motion. He exhibits no edema and no tenderness.  Lymphadenopathy:    He has no cervical adenopathy.  Neurological: He is alert and oriented to person, place, and time. He has normal reflexes. No cranial nerve deficit.  Skin: Skin is warm and dry. Rash noted. No erythema. No pallor.  Patient has this area of cellulitis on his abdomen that continues. He has appointment with the dermatologist   Psychiatric: He has a normal mood and affect. His behavior is normal. Judgment and thought content normal.   BP 134/74  Pulse 79  Temp(Src) 97.2 F (36.2 C) (Oral)  Ht 5' 8.5" (1.74 m)  Wt 219 lb (99.338 kg)  BMI 32.81 kg/m2  Results for orders placed in visit on 09/10/13  POCT CBC      Result Value Ref Range   WBC 7.4  4.6 - 10.2 K/uL   Lymph, poc 1.6  0.6 - 3.4   POC LYMPH PERCENT 21.2  10 - 50 %L   POC Granulocyte 5.4  2 - 6.9   Granulocyte percent 72.4  37 - 80 %G   RBC 5.2  4.69 - 6.13 M/uL   Hemoglobin 13.6 (*) 14.1 - 18.1 g/dL   HCT, POC 43.8  43.5 - 53.7 %   MCV 84.7  80 - 97 fL   MCH, POC 26.4 (*) 27 - 31.2 pg   MCHC 31.1 (*) 31.8 - 35.4 g/dL   RDW, POC 15.2     Platelet Count, POC 236.0  142 - 424 K/uL   MPV 9.0  0 - 99.8 fL  POCT GLYCOSYLATED HEMOGLOBIN (HGB A1C)      Result Value Ref Range   Hemoglobin A1C 8.5      the previous hemoglobin A1c was 8.1%.  [facility] reading (PRIMARY) by  [facility]-chest x-ray- no active disease                                    Assessment & Plan:  1. Type II or unspecified type diabetes mellitus without mention of complication, not stated as uncontrolled - POCT CBC - POCT glycosylated hemoglobin (Hb A1C)  2. Hyperlipidemia - Hepatic function panel - NMR, lipoprofile  3. Vitamin D deficiency - Vit D  25 hydroxy (rtn  osteoporosis monitoring)  4. Hypertension - POCT CBC - BMP8+EGFR  5. CAD - DG Chest 2 View; Future  6. GERD  7. HYPERTENSION - DG Chest 2 View; Future  Patient Instructions                       Medicare Annual Wellness Visit  [place] and the medical providers at [facility] strive to bring you the best medical care.  In doing so we not only want to address your current medical conditions and concerns but also to detect new conditions early and prevent illness, disease and health-related problems.    Medicare offers a yearly Wellness Visit which allows our clinical staff to assess your need for preventative services including immunizations, lifestyle education, counseling to decrease risk of preventable diseases and screening for fall risk and other medical concerns.    This visit is provided free of charge (no copay) for all Medicare recipients. The clinical pharmacists at [facility] have begun to conduct these Wellness Visits which will also include a thorough review of all your medications.    As you primary medical provider recommend that you make an appointment for your Annual Wellness Visit if you have not done so already this year.  You may set up this appointment before you leave today or you may call back ([phone]) and schedule an appointment.  Please make sure when you call that you mention that you are scheduling your Annual Wellness Visit with the clinical pharmacist so that the appointment may be made for the proper length of time.     Continue current medications. Continue good therapeutic lifestyle changes which include good diet and exercise. Fall precautions discussed with patient. If an FOBT was given today- please return it to our front desk. If you are over 62 years old - you may need Prevnar 4 or the adult Pneumonia vaccine.  We will call you with the results of the chest x-ray lab work and fecal occult blood  test when those results are available please  Please discuss with the orthopedist how and when you can become more active and work on your weight.    Arrie Senate MD

## 2013-09-11 ENCOUNTER — Ambulatory Visit: Payer: Medicare Other | Admitting: Physical Therapy

## 2013-09-12 LAB — BMP8+EGFR
BUN/Creatinine Ratio: 13 (ref 10–22)
BUN: 12 mg/dL (ref 8–27)
CALCIUM: 9.4 mg/dL (ref 8.6–10.2)
CO2: 23 mmol/L (ref 18–29)
Chloride: 99 mmol/L (ref 97–108)
Creatinine, Ser: 0.91 mg/dL (ref 0.76–1.27)
GFR calc Af Amer: 99 mL/min/{1.73_m2} (ref >59)
GFR calc non Af Amer: 86 mL/min/{1.73_m2} (ref >59)
Glucose: 215 mg/dL — ABNORMAL HIGH (ref 65–99)
Potassium: 4.1 mmol/L (ref 3.5–5.2)
SODIUM: 139 mmol/L (ref 134–144)

## 2013-09-12 LAB — NMR, LIPOPROFILE
Cholesterol: 128 mg/dL (ref <200)
HDL CHOLESTEROL BY NMR: 47 mg/dL (ref >=40)
HDL Particle Number: 39 umol/L (ref >=30.5)
LDL Particle Number: 575 nmol/L (ref <1000)
LDL Size: 19.8 nm — ABNORMAL LOW (ref >20.5)
LDLC SERPL CALC-MCNC: 46 mg/dL (ref <100)
LP-IR Score: 52 — ABNORMAL HIGH (ref <=45)
Small LDL Particle Number: 436 nmol/L (ref <=527)
TRIGLYCERIDES BY NMR: 177 mg/dL — AB (ref <150)

## 2013-09-12 LAB — HEPATIC FUNCTION PANEL
ALT: 19 IU/L (ref 0–44)
AST: 19 IU/L (ref 0–40)
Albumin: 4.1 g/dL (ref 3.6–4.8)
Alkaline Phosphatase: 55 IU/L (ref 39–117)
Bilirubin, Direct: 0.14 mg/dL (ref 0.00–0.40)
Total Bilirubin: 0.4 mg/dL (ref 0.0–1.2)
Total Protein: 6.2 g/dL (ref 6.0–8.5)

## 2013-09-12 LAB — VITAMIN D 25 HYDROXY (VIT D DEFICIENCY, FRACTURES): VIT D 25 HYDROXY: 37.8 ng/mL (ref 30.0–100.0)

## 2013-09-16 ENCOUNTER — Ambulatory Visit: Payer: Medicare Other | Admitting: Physical Therapy

## 2013-09-18 ENCOUNTER — Ambulatory Visit: Payer: Medicare Other | Admitting: Physical Therapy

## 2013-09-23 ENCOUNTER — Ambulatory Visit: Payer: Medicare Other | Admitting: Physical Therapy

## 2013-09-25 ENCOUNTER — Ambulatory Visit: Payer: Medicare Other | Attending: Orthopedic Surgery | Admitting: Physical Therapy

## 2013-09-25 ENCOUNTER — Ambulatory Visit (INDEPENDENT_AMBULATORY_CARE_PROVIDER_SITE_OTHER): Payer: Medicare Other | Admitting: Pharmacist

## 2013-09-25 VITALS — BP 128/76 | HR 77 | Ht 68.5 in | Wt 219.0 lb

## 2013-09-25 DIAGNOSIS — R5381 Other malaise: Secondary | ICD-10-CM | POA: Diagnosis not present

## 2013-09-25 DIAGNOSIS — IMO0001 Reserved for inherently not codable concepts without codable children: Secondary | ICD-10-CM | POA: Insufficient documentation

## 2013-09-25 DIAGNOSIS — E119 Type 2 diabetes mellitus without complications: Secondary | ICD-10-CM | POA: Diagnosis not present

## 2013-09-25 DIAGNOSIS — I2510 Atherosclerotic heart disease of native coronary artery without angina pectoris: Secondary | ICD-10-CM

## 2013-09-25 DIAGNOSIS — M25559 Pain in unspecified hip: Secondary | ICD-10-CM | POA: Insufficient documentation

## 2013-09-25 MED ORDER — CANAGLIFLOZIN-METFORMIN HCL 150-1000 MG PO TABS: 1.0000 | ORAL_TABLET | Freq: Two times a day (BID) | ORAL | Status: DC

## 2013-09-25 NOTE — Progress Notes (Signed)
Diabetes Follow-Up Visit Chief Complaint:   No chief complaint on file.   HPI:  Patient has had diabetes for many years.  He is taking Invokana 300mg  daily, metformin 1000mg  BID, Levemir 48units BID and Novolog 2 to 3 times daily to control DM. Last A1C was 8.5% (09/10/2013)  Low fat/carbohydrate diet?  Has not been following dietary recommendations as strictly the last 4-5  months Nicotine Abuse?  No Medication Compliance?  Yes Exercise?  Yes - but a little less due to some hip pain recently Alcohol Abuse?  No  Home BG Monitoring:  Checking 2-3 times a day. Average:  160's High: 250   Exam Polyuria:  neg  Polydipsia:  neg  Polyphagia:  neg  BMI:  There is no weight on file to calculate BMI.   General Appearance:  alert, oriented, no acute distress and well nourished Mood/Affect:  normal     Lab Results  Component Value Date   HGBA1C 8.5 09/10/2013    No results found for this basename: MICROALBUR,  [id number]     Assessment: 1.  Diabetes - A1c worse and not following dietary recommendations 2.  Blood Pressure.  Well controlled 3.  Lipids.  Improved - LDL-P at goal, Tg slightly elevated   Recommendations:   1. Change to Invokamet 150/1000mg  1 tablet bid with food 2.  Increase Levemir to 50 units each evening, continue 48 units each morning 3.  Dietary recommendations:  Reviewed serving size recommendations of high CHO foods.  Patient to increase lean proteins, whole grains and vegetables.  4.  Physical Activity recommendations:  Patient has plans to restart regular exercise with [place] - goal 30 to 40 min 4 days/week 5. RTC in 4-6 weeks to recheck HBG   Cherre Robins, PharmD, CPP

## 2013-09-26 ENCOUNTER — Encounter: Payer: Self-pay | Admitting: Pharmacist

## 2013-10-21 ENCOUNTER — Other Ambulatory Visit: Payer: Self-pay | Admitting: Family Medicine

## 2013-10-22 DIAGNOSIS — M76899 Other specified enthesopathies of unspecified lower limb, excluding foot: Secondary | ICD-10-CM | POA: Diagnosis not present

## 2013-10-31 DIAGNOSIS — H5200 Hypermetropia, unspecified eye: Secondary | ICD-10-CM | POA: Diagnosis not present

## 2013-10-31 DIAGNOSIS — E119 Type 2 diabetes mellitus without complications: Secondary | ICD-10-CM | POA: Diagnosis not present

## 2013-10-31 DIAGNOSIS — H52229 Regular astigmatism, unspecified eye: Secondary | ICD-10-CM | POA: Diagnosis not present

## 2013-10-31 DIAGNOSIS — H2510 Age-related nuclear cataract, unspecified eye: Secondary | ICD-10-CM | POA: Diagnosis not present

## 2013-11-13 ENCOUNTER — Encounter: Payer: Self-pay | Admitting: Pharmacist

## 2013-11-13 ENCOUNTER — Ambulatory Visit (INDEPENDENT_AMBULATORY_CARE_PROVIDER_SITE_OTHER): Payer: Medicare Other | Admitting: Pharmacist

## 2013-11-13 VITALS — BP 136/62 | HR 68 | Ht 68.5 in | Wt 220.0 lb

## 2013-11-13 DIAGNOSIS — E119 Type 2 diabetes mellitus without complications: Secondary | ICD-10-CM | POA: Diagnosis not present

## 2013-11-13 DIAGNOSIS — I2510 Atherosclerotic heart disease of native coronary artery without angina pectoris: Secondary | ICD-10-CM | POA: Diagnosis not present

## 2013-11-13 MED ORDER — CANAGLIFLOZIN-METFORMIN HCL 150-1000 MG PO TABS: 1.0000 | ORAL_TABLET | Freq: Two times a day (BID) | ORAL | Status: DC

## 2013-11-13 NOTE — Progress Notes (Signed)
Diabetes Follow-Up Visit Chief Complaint:   Chief Complaint  Patient presents with  . Diabetes    HPI:  Patient has had diabetes for many years.  He is taking InvokaMet 150/1000mg  BID, Levemir 48 units qam and 50 units qpm and Novolog 2 to 3 times daily to control DM. Last A1C was 8.5% (09/10/2013)  Low fat/carbohydrate diet?  Improving but still moments of indiscretion Nicotine Abuse?  No Medication Compliance?  Yes Exercise?  Yes - has started working out with [place]/PT again Alcohol Abuse?  No  Home BG Monitoring:  Checking 2-3 times a day. Average:  156   High: 235 Low:  52  Patient has had 2 lows during the night over the last 6 weeks.  Exam Polyuria:  neg  Polydipsia:  neg  Polyphagia:  neg  BMI:  Body mass index is 32.96 kg/(m^2).   General Appearance:  alert, oriented, no acute distress and well nourished Mood/Affect:  normal   Urine microalbumin - Negative (06/09/2013) Lipid Panel (09/10/2013) LDL-P = 575 Tg= 177 Tc = 128 HDL = 47  Lab Results  Component Value Date   HGBA1C 8.5 09/10/2013    No results found for this basename: MICROALBUR,  [id number]     Assessment: 1.  Diabetes - HBG readings have improved.  Having some over night low's that are concerning 2.  Blood Pressure.  Well controlled 3.  Lipids.  Improved - LDL-P at goal, Tg slightly elevated but improved from 205 in December 2014  Recommendations:   1. Continue Invokamet 150/1000mg  1 tablet bid with food,  Levemir to 50 units each evening, continue 48 units each morning and Novolog 2.  Patient will RTC on Tuesday 11/18/13 to have continuous glucose monitor placed to better evaluate current regimen.  Will make changes to regimen as needed once report available. 3.  Dietary recommendations:  Reviewed serving size recommendations of high CHO foods.  4.  Physical Activity recommendations:  Continue regular exercise with [place] - goal 30 to 40 min 4 days/week 5. RTC in 4-6 weeks to recheck HBG   Cherre Robins, PharmD, CPP

## 2013-11-20 ENCOUNTER — Encounter: Payer: Self-pay | Admitting: Internal Medicine

## 2013-11-27 ENCOUNTER — Encounter: Payer: Self-pay | Admitting: Internal Medicine

## 2013-12-08 ENCOUNTER — Ambulatory Visit: Payer: Medicare Other | Admitting: Family Medicine

## 2013-12-08 DIAGNOSIS — L570 Actinic keratosis: Secondary | ICD-10-CM | POA: Diagnosis not present

## 2013-12-08 DIAGNOSIS — C44319 Basal cell carcinoma of skin of other parts of face: Secondary | ICD-10-CM | POA: Diagnosis not present

## 2013-12-08 DIAGNOSIS — D485 Neoplasm of uncertain behavior of skin: Secondary | ICD-10-CM | POA: Diagnosis not present

## 2013-12-12 ENCOUNTER — Encounter: Payer: Self-pay | Admitting: Family Medicine

## 2013-12-12 ENCOUNTER — Ambulatory Visit (INDEPENDENT_AMBULATORY_CARE_PROVIDER_SITE_OTHER): Payer: Medicare Other | Admitting: Family Medicine

## 2013-12-12 ENCOUNTER — Other Ambulatory Visit: Payer: Self-pay | Admitting: Pharmacist

## 2013-12-12 VITALS — BP 133/79 | HR 79 | Temp 97.3°F | Ht 68.5 in | Wt 218.0 lb

## 2013-12-12 DIAGNOSIS — R05 Cough: Secondary | ICD-10-CM

## 2013-12-12 DIAGNOSIS — E1121 Type 2 diabetes mellitus with diabetic nephropathy: Secondary | ICD-10-CM

## 2013-12-12 DIAGNOSIS — T148 Other injury of unspecified body region: Secondary | ICD-10-CM | POA: Diagnosis not present

## 2013-12-12 DIAGNOSIS — R058 Other specified cough: Secondary | ICD-10-CM

## 2013-12-12 DIAGNOSIS — W57XXXA Bitten or stung by nonvenomous insect and other nonvenomous arthropods, initial encounter: Secondary | ICD-10-CM

## 2013-12-12 DIAGNOSIS — J069 Acute upper respiratory infection, unspecified: Secondary | ICD-10-CM | POA: Diagnosis not present

## 2013-12-12 DIAGNOSIS — R059 Cough, unspecified: Secondary | ICD-10-CM | POA: Diagnosis not present

## 2013-12-12 DIAGNOSIS — I2510 Atherosclerotic heart disease of native coronary artery without angina pectoris: Secondary | ICD-10-CM | POA: Diagnosis not present

## 2013-12-12 LAB — POCT CBC
Granulocyte percent: 77.7 %G (ref 37–80)
HEMATOCRIT: 44.1 % (ref 43.5–53.7)
Hemoglobin: 13.7 g/dL — AB (ref 14.1–18.1)
Lymph, poc: 1.5 (ref 0.6–3.4)
MCH, POC: 25.9 pg — AB (ref 27–31.2)
MCHC: 31.1 g/dL — AB (ref 31.8–35.4)
MCV: 83.4 fL (ref 80–97)
MPV: 8.6 fL (ref 0–99.8)
POC Granulocyte: 7.3 — AB (ref 2–6.9)
POC LYMPH PERCENT: 15.5 %L (ref 10–50)
Platelet Count, POC: 245 10*3/uL (ref 142–424)
RBC: 5.3 M/uL (ref 4.69–6.13)
RDW, POC: 15.8 %
WBC: 9.4 10*3/uL (ref 4.6–10.2)

## 2013-12-12 NOTE — Patient Instructions (Signed)
Take antibiotic as directed Drink plenty of fluids Use Mucinex, blue and white over-the-counter one twice a day as needed for cough and congestion Take Zyrtec Take Tylenol for aches pains and fever Return to clinic if any signs or symptoms of [facility] spotted fever

## 2013-12-12 NOTE — Progress Notes (Signed)
Subjective:    Patient ID: Marvin Frost, male    DOB: 1945/09/29, 68 y.o.   MRN: [id number]  HPI Patient here today for cough and congestion that started 5 days ago.       Patient Active Problem List   Diagnosis Date Noted  . Nonspecific (abnormal) findings on radiological and other examination of gastrointestinal tract 09/27/2010  . Acute pancreatitis 09/27/2010  . NAUSEA AND VOMITING 08/16/2010  . DM 03/18/2009  . HYPERLIPIDEMIA 03/18/2009  . OBESITY 03/18/2009  . HYPERTENSION 03/18/2009  . CAD 03/18/2009  . [facility] 03/18/2009  . GERD 03/18/2009   Outpatient Encounter Prescriptions as of 12/12/2013  Medication Sig  . aspirin 81 MG tablet Take 81 mg by mouth daily.    . Canagliflozin-Metformin HCl [phone] MG TABS Take 1 tablet by mouth 2 (two) times daily.  . Cholecalciferol (VITAMIN D) 2000 UNITS CAPS Take 1 capsule by mouth daily.  Marland Kitchen glucosamine-chondroitin 500-400 MG tablet Take 1 tablet by mouth 1 dose over 46 hours.    . insulin aspart (NOVOLOG FLEXPEN) 100 UNIT/ML SOPN FlexPen Inject 10 Units into the skin 4 (four) times daily -  with meals and at bedtime.  . insulin detemir (LEVEMIR) 100 UNIT/ML injection INJECT 48 UNITS EACH MORNING AND 50 UNITS EACH EVENING  . Multiple Vitamin (MULTIVITAMIN) capsule Take 1 capsule by mouth daily.    Marland Kitchen NOVOTWIST 32G X 5 MM MISC USE FOUR TIMES A DAY WITH INSULIN PENS  . olmesartan-hydrochlorothiazide (BENICAR HCT) 40-25 MG per tablet Take 1 tablet by mouth daily.  Marland Kitchen omega-3 acid ethyl esters (LOVAZA) 1 G capsule Take 2 capsules (2 g total) by mouth daily.  . ONE TOUCH ULTRA TEST test strip USE TO CHECK BLOOD GLUCOSE UP TO FOUR TIMES A DAY  . pantoprazole (PROTONIX) 40 MG tablet Take 1 tablet (40 mg total) by mouth 2 (two) times daily.  . rosuvastatin (CRESTOR) 10 MG tablet Take 1 tablet (10 mg total) by mouth daily.  Marland Kitchen ZETIA 10 MG tablet Take 1 tablet (10 mg total) by mouth daily.     Review of Systems  Constitutional: Positive for  fever (low grade).  HENT: Negative for sinus pressure and sore throat.   Respiratory: Positive for cough (prod, yellow x 5 days) and wheezing (slight).   Neurological: Positive for headaches (slight). Negative for dizziness.       Objective:   Physical Exam  Nursing note and vitals reviewed. Constitutional: He is oriented to person, place, and time. He appears well-developed and well-nourished. No distress.  HENT:  Head: Normocephalic and atraumatic.  Right Ear: External ear normal.  Left Ear: External ear normal.  Mouth/Throat: No oropharyngeal exudate.  Throat is red posteriorly. There is nasal congestion bilaterally  Eyes: Conjunctivae and EOM are normal. Pupils are equal, round, and reactive to light. Right eye exhibits no discharge. Left eye exhibits no discharge. No scleral icterus.  Neck: Normal range of motion. Neck supple. No thyromegaly present.  Cardiovascular: Normal rate, regular rhythm and normal heart sounds.   No murmur heard. Pulmonary/Chest: Effort normal and breath sounds normal. No respiratory distress. He has no wheezes. He has no rales. He exhibits no tenderness.  Dry cough  Abdominal: Soft. Bowel sounds are normal. He exhibits no mass. There is no tenderness. There is no rebound and no guarding.  Musculoskeletal: Normal range of motion. He exhibits no edema.  Lymphadenopathy:    He has no cervical adenopathy.  Neurological: He is alert and oriented to person, place,  and time.  Skin: Skin is warm and dry. No rash noted. There is erythema. No pallor.  On examining the patient a wood tick was removed from his mid thoracic spine. He was not aware this was present. The tic did not appear engorged.  Psychiatric: He has a normal mood and affect. His behavior is normal. Judgment and thought content normal.   BP 133/79  Pulse 79  Temp(Src) 97.3 F (36.3 C) (Oral)  Ht 5' 8.5" (1.74 m)  Wt 218 lb (98.884 kg)  BMI 32.66 kg/m2        Assessment & Plan:  1.  Productive cough - POCT CBC  2. URI (upper respiratory infection)  3. Tick bite -Doxycycline 100 mg twice daily for 2 weeks  Patient Instructions  Take antibiotic as directed Drink plenty of fluids Use Mucinex, blue and white over-the-counter one twice a day as needed for cough and congestion Take Zyrtec Take Tylenol for aches pains and fever Return to clinic if any signs or symptoms of [facility] spotted fever   Arrie Senate MD

## 2013-12-17 ENCOUNTER — Ambulatory Visit: Payer: Medicare Other | Admitting: Family Medicine

## 2013-12-22 ENCOUNTER — Ambulatory Visit: Payer: Medicare Other | Admitting: Family Medicine

## 2014-01-12 ENCOUNTER — Telehealth: Payer: Self-pay | Admitting: Family Medicine

## 2014-01-12 DIAGNOSIS — E1165 Type 2 diabetes mellitus with hyperglycemia: Secondary | ICD-10-CM

## 2014-01-12 NOTE — Telephone Encounter (Signed)
Patient interested in insulin pump.  Paperwork filled out for Pharmacist, community.  Patient will need C-peptide for ins.  He is due to have labs 01/19/14 - will get then .

## 2014-01-15 DIAGNOSIS — C44319 Basal cell carcinoma of skin of other parts of face: Secondary | ICD-10-CM | POA: Diagnosis not present

## 2014-01-15 DIAGNOSIS — Z85828 Personal history of other malignant neoplasm of skin: Secondary | ICD-10-CM | POA: Diagnosis not present

## 2014-01-19 ENCOUNTER — Encounter: Payer: Self-pay | Admitting: Family Medicine

## 2014-01-19 ENCOUNTER — Ambulatory Visit (INDEPENDENT_AMBULATORY_CARE_PROVIDER_SITE_OTHER): Payer: Medicare Other | Admitting: Family Medicine

## 2014-01-19 VITALS — BP 140/78 | HR 80 | Temp 96.9°F | Ht 68.5 in | Wt 218.0 lb

## 2014-01-19 DIAGNOSIS — E785 Hyperlipidemia, unspecified: Secondary | ICD-10-CM

## 2014-01-19 DIAGNOSIS — I10 Essential (primary) hypertension: Secondary | ICD-10-CM

## 2014-01-19 DIAGNOSIS — N400 Enlarged prostate without lower urinary tract symptoms: Secondary | ICD-10-CM | POA: Diagnosis not present

## 2014-01-19 DIAGNOSIS — E559 Vitamin D deficiency, unspecified: Secondary | ICD-10-CM

## 2014-01-19 DIAGNOSIS — E119 Type 2 diabetes mellitus without complications: Secondary | ICD-10-CM | POA: Diagnosis not present

## 2014-01-19 DIAGNOSIS — K219 Gastro-esophageal reflux disease without esophagitis: Secondary | ICD-10-CM | POA: Diagnosis not present

## 2014-01-19 DIAGNOSIS — I2510 Atherosclerotic heart disease of native coronary artery without angina pectoris: Secondary | ICD-10-CM

## 2014-01-19 LAB — POCT CBC
GRANULOCYTE PERCENT: 73.5 % (ref 37–80)
HCT, POC: 42 % — AB (ref 43.5–53.7)
Hemoglobin: 13.3 g/dL — AB (ref 14.1–18.1)
LYMPH, POC: 1.8 (ref 0.6–3.4)
MCH, POC: 26 pg — AB (ref 27–31.2)
MCHC: 31.7 g/dL — AB (ref 31.8–35.4)
MCV: 82.1 fL (ref 80–97)
MPV: 9.8 fL (ref 0–99.8)
PLATELET COUNT, POC: 222 10*3/uL (ref 142–424)
POC GRANULOCYTE: 5.8 (ref 2–6.9)
POC LYMPH %: 23.2 % (ref 10–50)
RBC: 5.1 M/uL (ref 4.69–6.13)
RDW, POC: 16.5 %
WBC: 7.9 10*3/uL (ref 4.6–10.2)

## 2014-01-19 LAB — POCT UA - MICROSCOPIC ONLY
Casts, Ur, LPF, POC: NEGATIVE
Crystals, Ur, HPF, POC: NEGATIVE
EPITHELIAL CELLS, URINE PER MICROSCOPY: NEGATIVE
MUCUS UA: NEGATIVE
RBC, URINE, MICROSCOPIC: NEGATIVE
WBC, Ur, HPF, POC: NEGATIVE
Yeast, UA: NEGATIVE

## 2014-01-19 LAB — POCT URINALYSIS DIPSTICK
BILIRUBIN UA: NEGATIVE
KETONES UA: NEGATIVE
Leukocytes, UA: NEGATIVE
Nitrite, UA: NEGATIVE
Protein, UA: NEGATIVE
RBC UA: NEGATIVE
Spec Grav, UA: 1.01
Urobilinogen, UA: NEGATIVE
pH, UA: 5

## 2014-01-19 LAB — POCT GLYCOSYLATED HEMOGLOBIN (HGB A1C): Hemoglobin A1C: 8.9

## 2014-01-19 LAB — POCT UA - MICROALBUMIN: Microalbumin Ur, POC: NEGATIVE mg/L

## 2014-01-19 MED ORDER — OMEGA-3-ACID ETHYL ESTERS 1 G PO CAPS: 2.0000 g | ORAL_CAPSULE | Freq: Every day | ORAL | Status: DC

## 2014-01-19 MED ORDER — ROSUVASTATIN CALCIUM 10 MG PO TABS: 10.0000 mg | ORAL_TABLET | Freq: Every day | ORAL | Status: DC

## 2014-01-19 MED ORDER — INSULIN DETEMIR 100 UNIT/ML ~~LOC~~ SOLN: SUBCUTANEOUS | Status: DC

## 2014-01-19 MED ORDER — INSULIN ASPART 100 UNIT/ML FLEXPEN: 10.0000 [IU] | PEN_INJECTOR | Freq: Three times a day (TID) | SUBCUTANEOUS | Status: DC

## 2014-01-19 MED ORDER — CANAGLIFLOZIN-METFORMIN HCL 150-1000 MG PO TABS: 1.0000 | ORAL_TABLET | Freq: Two times a day (BID) | ORAL | Status: DC

## 2014-01-19 MED ORDER — PANTOPRAZOLE SODIUM 40 MG PO TBEC: 40.0000 mg | DELAYED_RELEASE_TABLET | Freq: Two times a day (BID) | ORAL | Status: DC

## 2014-01-19 MED ORDER — OLMESARTAN MEDOXOMIL-HCTZ 40-25 MG PO TABS: 1.0000 | ORAL_TABLET | Freq: Every day | ORAL | Status: DC

## 2014-01-19 MED ORDER — ZETIA 10 MG PO TABS: 10.0000 mg | ORAL_TABLET | Freq: Every day | ORAL | Status: DC

## 2014-01-19 NOTE — Progress Notes (Signed)
Subjective:    Patient ID: Marvin Frost, male    DOB: [birth year], 68 y.o.   MRN: [id number]  HPI Pt here for follow up and management of chronic medical problems. The patient has no specific complaints today. He is due to have a rectal exam today. He is also due to get a C-peptide and microalbumin. He is up-to-date on his eye exams and All refills on medications have been completed. 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 He is up-to-date on his eye exam and he is due to return and         Patient Active Problem List   Diagnosis Date Noted  . Nonspecific (abnormal) findings on radiological and other examination of gastrointestinal tract 09/27/2010  . Acute pancreatitis 09/27/2010  . NAUSEA AND VOMITING 08/16/2010  . DM 03/18/2009  . HYPERLIPIDEMIA 03/18/2009  . OBESITY 03/18/2009  . HYPERTENSION 03/18/2009  . CAD 03/18/2009  . [facility] 03/18/2009  . GERD 03/18/2009   Outpatient Encounter Prescriptions as of 01/19/2014  Medication Sig  . aspirin 81 MG tablet Take 81 mg by mouth daily.    . Canagliflozin-Metformin HCl [phone] MG TABS Take 1 tablet by mouth 2 (two) times daily.  . Cholecalciferol (VITAMIN D) 2000 UNITS CAPS Take 1 capsule by mouth daily.  Marland Kitchen glucosamine-chondroitin 500-400 MG tablet Take 1 tablet by mouth 1 dose over 46 hours.    . insulin aspart (NOVOLOG FLEXPEN) 100 UNIT/ML SOPN FlexPen Inject 10 Units into the skin 4 (four) times daily -  with meals and at bedtime.  . insulin detemir (LEVEMIR) 100 UNIT/ML injection INJECT 48 UNITS EACH MORNING AND 50 UNITS EACH EVENING  . Multiple Vitamin (MULTIVITAMIN) capsule Take 1 capsule by mouth daily.    Marland Kitchen NOVOTWIST 32G X 5 MM MISC USE FOUR TIMES A DAY WITH INSULIN PENS  . olmesartan-hydrochlorothiazide (BENICAR HCT) 40-25 MG per tablet Take 1 tablet by mouth daily.  Marland Kitchen omega-3 acid ethyl esters (LOVAZA) 1 G capsule Take 2 capsules (2 g total) by mouth daily.  . ONE TOUCH ULTRA TEST  test strip USE TO CHECK BLOOD GLUCOSE UP TO FOUR TIMES A DAY  . pantoprazole (PROTONIX) 40 MG tablet Take 1 tablet (40 mg total) by mouth 2 (two) times daily.  . rosuvastatin (CRESTOR) 10 MG tablet Take 1 tablet (10 mg total) by mouth daily.  Marland Kitchen ZETIA 10 MG tablet Take 1 tablet (10 mg total) by mouth daily.    Review of Systems  Constitutional: Negative.   HENT: Negative.   Eyes: Negative.   Respiratory: Negative.   Cardiovascular: Negative.   Gastrointestinal: Negative.   Endocrine: Negative.   Genitourinary: Negative.   Musculoskeletal: Negative.   Skin: Negative.   Allergic/Immunologic: Negative.   Neurological: Negative.   Hematological: Negative.   Psychiatric/Behavioral: Negative.        Objective:   Physical Exam  Nursing note and vitals reviewed. Constitutional: He is oriented to person, place, and time. He appears well-developed and well-nourished. No distress.  HENT:  Head: Normocephalic and atraumatic.  Right Ear: External ear normal.  Left Ear: External ear normal.  Mouth/Throat: Oropharynx is clear and moist. No oropharyngeal exudate.  Recent nasal surgery for basal cell carcinoma  Eyes: Conjunctivae and EOM are normal. Pupils are equal, round, and reactive to light. Right eye exhibits no discharge. Left eye exhibits no discharge. No scleral icterus.  Neck: Normal range of motion. Neck supple. No thyromegaly present.  No  carotid bruits  Cardiovascular: Normal rate, regular rhythm, normal heart sounds and intact distal pulses.  Exam reveals no gallop and no friction rub.   No murmur heard. At 72 per minute  Pulmonary/Chest: Effort normal and breath sounds normal. No respiratory distress. He has no wheezes. He has no rales. He exhibits no tenderness.  No axillary nodes  Abdominal: Soft. Bowel sounds are normal. He exhibits no mass. There is no tenderness. There is no rebound and no guarding.  Obesity present no bruits  Genitourinary: Rectum normal and penis  normal.  The prostate was enlarged but soft and smooth. There were no rectal masses. There were no lumps in the prostate. There were no inguinal hernias. The external genitalia were normal.  Musculoskeletal: Normal range of motion. He exhibits no edema and no tenderness.  Lymphadenopathy:    He has no cervical adenopathy.  Neurological: He is alert and oriented to person, place, and time. He has normal reflexes. He displays normal reflexes. No cranial nerve deficit. He exhibits normal muscle tone.  Skin: Skin is warm and dry. No rash noted. No erythema. No pallor.  Psychiatric: He has a normal mood and affect. His behavior is normal. Judgment and thought content normal.   BP 140/78  Pulse 80  Temp(Src) 96.9 F (36.1 C) (Oral)  Ht 5' 8.5" (1.74 m)  Wt 218 lb (98.884 kg)  BMI 32.66 kg/m2        Assessment & Plan:  1. CAD - POCT CBC  2. DM - POCT UA - Microalbumin - POCT CBC - POCT glycosylated hemoglobin (Hb A1C) - C-peptide  3. Gastroesophageal reflux disease, esophagitis presence not specified - POCT CBC  4. HYPERLIPIDEMIA - POCT CBC - NMR, lipoprofile  5. HYPERTENSION - POCT CBC - BMP8+EGFR - Hepatic function panel  6. Vitamin D deficiency - POCT CBC - Vit D  25 hydroxy (rtn osteoporosis monitoring)  7. BPH (benign prostatic hyperplasia) - POCT UA - Microscopic Only - POCT urinalysis dipstick - POCT CBC - PSA, total and free  Meds ordered this encounter  Medications  . ZETIA 10 MG tablet    Sig: Take 1 tablet (10 mg total) by mouth daily.    Dispense:  90 tablet    Refill:  3  . rosuvastatin (CRESTOR) 10 MG tablet    Sig: Take 1 tablet (10 mg total) by mouth daily.    Dispense:  90 tablet    Refill:  3  . pantoprazole (PROTONIX) 40 MG tablet    Sig: Take 1 tablet (40 mg total) by mouth 2 (two) times daily.    Dispense:  180 tablet    Refill:  3  . omega-3 acid ethyl esters (LOVAZA) 1 G capsule    Sig: Take 2 capsules (2 g total) by mouth daily.     Dispense:  180 capsule    Refill:  3  . olmesartan-hydrochlorothiazide (BENICAR HCT) 40-25 MG per tablet    Sig: Take 1 tablet by mouth daily.    Dispense:  90 tablet    Refill:  3  . Canagliflozin-Metformin HCl [phone] MG TABS    Sig: Take 1 tablet by mouth 2 (two) times daily.    Dispense:  180 tablet    Refill:  1  . insulin aspart (NOVOLOG FLEXPEN) 100 UNIT/ML FlexPen    Sig: Inject 10 Units into the skin 4 (four) times daily -  with meals and at bedtime.    Dispense:  45 mL    Refill:  3  . insulin detemir (LEVEMIR) 100 UNIT/ML injection    Sig: INJECT 48 UNITS EACH MORNING AND 50 UNITS EACH EVENING    Dispense:  90 mL    Refill:  3   Patient Instructions                       Medicare Annual Wellness Visit  [facility] and the medical providers at [facility] strive to bring you the best medical care.  In doing so we not only want to address your current medical conditions and concerns but also to detect new conditions early and prevent illness, disease and health-related problems.    Medicare offers a yearly Wellness Visit which allows our clinical staff to assess your need for preventative services including immunizations, lifestyle education, counseling to decrease risk of preventable diseases and screening for fall risk and other medical concerns.    This visit is provided free of charge (no copay) for all Medicare recipients. The clinical pharmacists at [facility] have begun to conduct these Wellness Visits which will also include a thorough review of all your medications.    As you primary medical provider recommend that you make an appointment for your Annual Wellness Visit if you have not done so already this year.  You may set up this appointment before you leave today or you may call back ([phone]) and schedule an appointment.  Please make sure when you call that you mention that you are scheduling your Annual Wellness Visit  with the clinical pharmacist so that the appointment may be made for the proper length of time.     Continue current medications. Continue good therapeutic lifestyle changes which include good diet and exercise. Fall precautions discussed with patient. If an FOBT was given today- please return it to our front desk. If you are over 47 years old - you may need Prevnar 19 or the adult Pneumonia vaccine.  Continued followup with a clinical pharmacist regarding the insulin pump Continue with aggressive exercise and diet habits   Arrie Senate MD

## 2014-01-19 NOTE — Patient Instructions (Addendum)
Medicare Annual Wellness Visit  [facility] and the medical providers at [facility] strive to bring you the best medical care.  In doing so we not only want to address your current medical conditions and concerns but also to detect new conditions early and prevent illness, disease and health-related problems.    Medicare offers a yearly Wellness Visit which allows our clinical staff to assess your need for preventative services including immunizations, lifestyle education, counseling to decrease risk of preventable diseases and screening for fall risk and other medical concerns.    This visit is provided free of charge (no copay) for all Medicare recipients. The clinical pharmacists at [facility] have begun to conduct these Wellness Visits which will also include a thorough review of all your medications.    As you primary medical provider recommend that you make an appointment for your Annual Wellness Visit if you have not done so already this year.  You may set up this appointment before you leave today or you may call back ([phone]) and schedule an appointment.  Please make sure when you call that you mention that you are scheduling your Annual Wellness Visit with the clinical pharmacist so that the appointment may be made for the proper length of time.     Continue current medications. Continue good therapeutic lifestyle changes which include good diet and exercise. Fall precautions discussed with patient. If an FOBT was given today- please return it to our front desk. If you are over 75 years old - you may need Prevnar 2 or the adult Pneumonia vaccine.  Continued followup with a clinical pharmacist regarding the insulin pump Continue with aggressive exercise and diet habits

## 2014-01-20 LAB — NMR, LIPOPROFILE
CHOLESTEROL: 141 mg/dL (ref 100–199)
HDL CHOLESTEROL BY NMR: 37 mg/dL — AB (ref >39)
HDL PARTICLE NUMBER: 35.3 umol/L (ref >=30.5)
LDL Particle Number: <300 nmol/L (ref <1000)
LDL Size: 19.6 nm (ref >20.5)
LDLC SERPL CALC-MCNC: 52 mg/dL (ref 0–99)
LP-IR SCORE: 50 — AB (ref <=45)
Small LDL Particle Number: 199 nmol/L (ref <=527)
Triglycerides by NMR: 259 mg/dL — ABNORMAL HIGH (ref 0–149)

## 2014-01-20 LAB — HEPATIC FUNCTION PANEL
ALBUMIN: 4.1 g/dL (ref 3.5–4.8)
ALT: 23 IU/L (ref 0–44)
AST: 17 IU/L (ref 0–40)
Alkaline Phosphatase: 57 IU/L (ref 39–117)
Bilirubin, Direct: 0.1 mg/dL (ref 0.00–0.40)
TOTAL PROTEIN: 6 g/dL (ref 6.0–8.5)
Total Bilirubin: 0.3 mg/dL (ref 0.0–1.2)

## 2014-01-20 LAB — BMP8+EGFR
BUN/Creatinine Ratio: 21 (ref 10–22)
BUN: 18 mg/dL (ref 8–27)
CO2: 20 mmol/L (ref 18–29)
Calcium: 8.9 mg/dL (ref 8.6–10.2)
Chloride: 102 mmol/L (ref 97–108)
Creatinine, Ser: 0.87 mg/dL (ref 0.76–1.27)
GFR calc Af Amer: 101 mL/min/{1.73_m2} (ref >59)
GFR calc non Af Amer: 87 mL/min/{1.73_m2} (ref >59)
Glucose: 222 mg/dL — ABNORMAL HIGH (ref 65–99)
POTASSIUM: 4.4 mmol/L (ref 3.5–5.2)
Sodium: 140 mmol/L (ref 134–144)

## 2014-01-20 LAB — C-PEPTIDE: C-Peptide: 2.4 ng/mL (ref 1.1–4.4)

## 2014-01-20 LAB — VITAMIN D 25 HYDROXY (VIT D DEFICIENCY, FRACTURES): VIT D 25 HYDROXY: 30.5 ng/mL (ref 30.0–100.0)

## 2014-01-20 LAB — PSA, TOTAL AND FREE
PSA, Free Pct: 30.7 %
PSA, Free: 0.43 ng/mL
PSA: 1.4 ng/mL (ref 0.0–4.0)

## 2014-01-29 ENCOUNTER — Ambulatory Visit (AMBULATORY_SURGERY_CENTER): Payer: Medicare Other

## 2014-01-29 VITALS — Ht 68.0 in | Wt 218.0 lb

## 2014-01-29 DIAGNOSIS — Z1211 Encounter for screening for malignant neoplasm of colon: Secondary | ICD-10-CM

## 2014-01-29 MED ORDER — SUPREP BOWEL PREP KIT 17.5-3.13-1.6 GM/177ML PO SOLN: 1.0000 | Freq: Once | ORAL | Status: DC

## 2014-01-29 NOTE — Progress Notes (Signed)
No allergies to eggs or soy No diet/weight loss meds No home oxygen No past problems with anesthesia  Has email [email or web address]

## 2014-02-12 ENCOUNTER — Ambulatory Visit (AMBULATORY_SURGERY_CENTER): Payer: Medicare Other | Admitting: Internal Medicine

## 2014-02-12 ENCOUNTER — Encounter: Payer: Self-pay | Admitting: Internal Medicine

## 2014-02-12 VITALS — BP 117/55 | HR 66 | Temp 95.8°F | Resp 20 | Ht 68.0 in | Wt 218.0 lb

## 2014-02-12 DIAGNOSIS — E119 Type 2 diabetes mellitus without complications: Secondary | ICD-10-CM | POA: Diagnosis not present

## 2014-02-12 DIAGNOSIS — Z1211 Encounter for screening for malignant neoplasm of colon: Secondary | ICD-10-CM

## 2014-02-12 DIAGNOSIS — I10 Essential (primary) hypertension: Secondary | ICD-10-CM | POA: Diagnosis not present

## 2014-02-12 DIAGNOSIS — I2510 Atherosclerotic heart disease of native coronary artery without angina pectoris: Secondary | ICD-10-CM | POA: Diagnosis not present

## 2014-02-12 LAB — GLUCOSE, CAPILLARY
Glucose-Capillary: 230 mg/dL — ABNORMAL HIGH (ref 70–99)
Glucose-Capillary: 204 mg/dL — ABNORMAL HIGH (ref 70–99)

## 2014-02-12 MED ORDER — SODIUM CHLORIDE 0.9 % IV SOLN: 500.0000 mL | INTRAVENOUS | Status: DC

## 2014-02-12 NOTE — Op Note (Signed)
[place]  [address] [place], [phone]   COLONOSCOPY PROCEDURE REPORT  PATIENT: Marvin Frost, Marvin Frost  MR#: [id number] BIRTHDATE: April 30, 1946 , 79  yrs. old GENDER: Male ENDOSCOPIST: Gatha Mayer, MD, [facility] PROCEDURE DATE:  02/12/2014 PROCEDURE:   Colonoscopy, screening First Screening Colonoscopy - Avg.  risk and is 50 yrs.  old or older - No.  Prior Negative Screening - Now for repeat screening. 10 or more years since last screening  History of Adenoma - Now for follow-up colonoscopy & has been > or = to 3 yrs.  N/A  Polyps Removed Today? No.  Recommend repeat exam, <10 yrs? No. ASA CLASS:   Class III INDICATIONS:average risk screening and Last colonoscopy performed 10 years ago. MEDICATIONS: propofol (Diprivan) 250mg  IV, MAC sedation, administered by CRNA, and These medications were titrated to patient response per physician's verbal order  DESCRIPTION OF PROCEDURE:   After the risks benefits and alternatives of the procedure were thoroughly explained, informed consent was obtained.  A digital rectal exam revealed no abnormalities of the rectum, A digital rectal exam revealed no prostatic nodules, and A digital rectal exam revealed the prostate was not enlarged.   The LB [id number]  endoscope was introduced through the anus and advanced to the cecum, which was identified by both the appendix and ileocecal valve. No adverse events experienced.   The quality of the prep was excellent using Suprep  The instrument was then slowly withdrawn as the colon was fully examined.  COLON FINDINGS: Severe diverticulosis was noted in the sigmoid colon.   The colon mucosa was otherwise normal.   A right colon retroflexion was performed.  Retroflexed views revealed no abnormalities. The time to cecum=2 minutes 07 seconds.  Withdrawal time=6 minutes 16 seconds.  The scope was withdrawn and the procedure completed. COMPLICATIONS: There were no  complications.  ENDOSCOPIC IMPRESSION: 1.   Severe diverticulosis was noted in the sigmoid colon 2.   The colon mucosa was otherwise normal - excellent prep  RECOMMENDATIONS: follow-up is advised on a PRN basis.  eSigned:  Gatha Mayer, MD, [facility] 02/12/2014 10:31 AM   cc: The Patient

## 2014-02-12 NOTE — Progress Notes (Signed)
Report to PACU, RN, vss, BBS= Clear.  

## 2014-02-12 NOTE — Patient Instructions (Signed)
Impressions/recommendations:  Diverticulosis (handout given)  YOU HAD AN ENDOSCOPIC PROCEDURE TODAY AT [facility]: Refer to the procedure report that was given to you for any specific questions about what was found during the examination.  If the procedure report does not answer your questions, please call your gastroenterologist to clarify.  If you requested that your care partner not be given the details of your procedure findings, then the procedure report has been included in a sealed envelope for you to review at your convenience later.  YOU SHOULD EXPECT: Some feelings of bloating in the abdomen. Passage of more gas than usual.  Walking can help get rid of the air that was put into your GI tract during the procedure and reduce the bloating. If you had a lower endoscopy (such as a colonoscopy or flexible sigmoidoscopy) you may notice spotting of blood in your stool or on the toilet paper. If you underwent a bowel prep for your procedure, then you may not have a normal bowel movement for a few days.  DIET: Your first meal following the procedure should be a light meal and then it is ok to progress to your normal diet.  A half-sandwich or bowl of soup is an example of a good first meal.  Heavy or fried foods are harder to digest and may make you feel nauseous or bloated.  Likewise meals heavy in dairy and vegetables can cause extra gas to form and this can also increase the bloating.  Drink plenty of fluids but you should avoid alcoholic beverages for 24 hours.  ACTIVITY: Your care partner should take you home directly after the procedure.  You should plan to take it easy, moving slowly for the rest of the day.  You can resume normal activity the day after the procedure however you should NOT DRIVE or use heavy machinery for 24 hours (because of the sedation medicines used during the test).    SYMPTOMS TO REPORT IMMEDIATELY: A gastroenterologist can be reached at any hour.  During  normal business hours, 8:30 AM to 5:00 PM Monday through Friday, call [phone].  After hours and on weekends, please call the GI answering service at [phone] who will take a message and have the physician on call contact you.   Following lower endoscopy (colonoscopy or flexible sigmoidoscopy):  Excessive amounts of blood in the stool  Significant tenderness or worsening of abdominal pains  Swelling of the abdomen that is new, acute  Fever of 100F or higher   FOLLOW UP: If any biopsies were taken you will be contacted by phone or by letter within the next 1-3 weeks.  Call your gastroenterologist if you have not heard about the biopsies in 3 weeks.  Our staff will call the home number listed on your records the next business day following your procedure to check on you and address any questions or concerns that you may have at that time regarding the information given to you following your procedure. This is a courtesy call and so if there is no answer at the home number and we have not heard from you through the emergency physician on call, we will assume that you have returned to your regular daily activities without incident.  SIGNATURES/CONFIDENTIALITY: You and/or your care partner have signed paperwork which will be entered into your electronic medical record.  These signatures attest to the fact that that the information above on your After Visit Summary has been reviewed and is understood.  Full responsibility of the confidentiality of this discharge information lies with you and/or your care-partner.

## 2014-02-13 ENCOUNTER — Telehealth: Payer: Self-pay | Admitting: *Deleted

## 2014-02-13 NOTE — Telephone Encounter (Signed)
  Follow up Call-  Call back number 02/12/2014  Post procedure Call Back phone  # [phone]  Permission to leave phone message Yes     Patient questions:  Do you have a fever, pain , or abdominal swelling? No. Pain Score  0 *  Have you tolerated food without any problems? Yes.    Have you been able to return to your normal activities? Yes.    Do you have any questions about your discharge instructions: Diet   No. Medications  No. Follow up visit  No.  Do you have questions or concerns about your Care? No.  Actions: * If pain score is 4 or above: No action needed, pain <4.

## 2014-02-20 ENCOUNTER — Telehealth: Payer: Self-pay | Admitting: Pharmacist

## 2014-02-20 DIAGNOSIS — E119 Type 2 diabetes mellitus without complications: Secondary | ICD-10-CM

## 2014-02-20 DIAGNOSIS — Z794 Long term (current) use of insulin: Principal | ICD-10-CM

## 2014-02-20 NOTE — Telephone Encounter (Signed)
Called patient to let him that pump wsa denied based on non qualifying C Peptide, but he states that [place] contacting him and they states that pump was approved.  Per Roxane at [employer] pump was denied - I gave patient her number to discuss.  [facility] is also going to come in 02/23/14 to recheck Cpeptide and Bmet.

## 2014-02-23 ENCOUNTER — Other Ambulatory Visit (INDEPENDENT_AMBULATORY_CARE_PROVIDER_SITE_OTHER): Payer: Medicare Other

## 2014-02-23 DIAGNOSIS — E119 Type 2 diabetes mellitus without complications: Secondary | ICD-10-CM

## 2014-02-23 DIAGNOSIS — Z794 Long term (current) use of insulin: Secondary | ICD-10-CM | POA: Diagnosis not present

## 2014-02-24 LAB — BMP8+EGFR
BUN / CREAT RATIO: 15 (ref 10–22)
BUN: 14 mg/dL (ref 8–27)
CO2: 22 mmol/L (ref 18–29)
Calcium: 8.9 mg/dL (ref 8.6–10.2)
Chloride: 97 mmol/L (ref 97–108)
Creatinine, Ser: 0.95 mg/dL (ref 0.76–1.27)
GFR calc non Af Amer: 81 mL/min/{1.73_m2} (ref >59)
GFR, EST AFRICAN AMERICAN: 93 mL/min/{1.73_m2} (ref >59)
Glucose: 162 mg/dL — ABNORMAL HIGH (ref 65–99)
Potassium: 4.1 mmol/L (ref 3.5–5.2)
SODIUM: 137 mmol/L (ref 134–144)

## 2014-02-24 LAB — C-PEPTIDE: C-Peptide: 1.6 ng/mL (ref 1.1–4.4)

## 2014-02-27 ENCOUNTER — Telehealth: Payer: Self-pay | Admitting: Pharmacist

## 2014-02-27 NOTE — Telephone Encounter (Signed)
Patient aware of result of labs from 02/23/14. C-Peptide was lower end of normal.  I spoke with [employer] rep about this qualifying him for medicare to cover insulin pump and she is doubtful that C Peptide will be low enough but she is checking.

## 2014-03-30 ENCOUNTER — Ambulatory Visit (INDEPENDENT_AMBULATORY_CARE_PROVIDER_SITE_OTHER): Payer: Medicare Other

## 2014-03-30 DIAGNOSIS — Z23 Encounter for immunization: Secondary | ICD-10-CM | POA: Diagnosis not present

## 2014-04-15 ENCOUNTER — Ambulatory Visit (INDEPENDENT_AMBULATORY_CARE_PROVIDER_SITE_OTHER): Payer: Medicare Other | Admitting: Ophthalmology

## 2014-04-15 DIAGNOSIS — I10 Essential (primary) hypertension: Secondary | ICD-10-CM | POA: Diagnosis not present

## 2014-04-15 DIAGNOSIS — E11319 Type 2 diabetes mellitus with unspecified diabetic retinopathy without macular edema: Secondary | ICD-10-CM

## 2014-04-15 DIAGNOSIS — H35033 Hypertensive retinopathy, bilateral: Secondary | ICD-10-CM | POA: Diagnosis not present

## 2014-04-15 DIAGNOSIS — H2513 Age-related nuclear cataract, bilateral: Secondary | ICD-10-CM

## 2014-04-15 DIAGNOSIS — H3531 Nonexudative age-related macular degeneration: Secondary | ICD-10-CM

## 2014-04-15 DIAGNOSIS — E11329 Type 2 diabetes mellitus with mild nonproliferative diabetic retinopathy without macular edema: Secondary | ICD-10-CM

## 2014-04-15 DIAGNOSIS — H43813 Vitreous degeneration, bilateral: Secondary | ICD-10-CM

## 2014-04-15 LAB — HM DIABETES EYE EXAM

## 2014-04-27 ENCOUNTER — Telehealth: Payer: Self-pay | Admitting: Pharmacist

## 2014-04-27 NOTE — Telephone Encounter (Signed)
appt made for 05/07/14 at 9am - patient aware

## 2014-05-07 ENCOUNTER — Encounter (INDEPENDENT_AMBULATORY_CARE_PROVIDER_SITE_OTHER): Payer: Self-pay

## 2014-05-07 ENCOUNTER — Ambulatory Visit (INDEPENDENT_AMBULATORY_CARE_PROVIDER_SITE_OTHER): Payer: Medicare Other | Admitting: Pharmacist

## 2014-05-07 ENCOUNTER — Encounter: Payer: Self-pay | Admitting: Pharmacist

## 2014-05-07 VITALS — BP 136/84 | HR 72 | Ht 68.0 in | Wt 218.0 lb

## 2014-05-07 DIAGNOSIS — E1151 Type 2 diabetes mellitus with diabetic peripheral angiopathy without gangrene: Secondary | ICD-10-CM | POA: Diagnosis not present

## 2014-05-07 DIAGNOSIS — I2510 Atherosclerotic heart disease of native coronary artery without angina pectoris: Secondary | ICD-10-CM

## 2014-05-07 DIAGNOSIS — B3742 Candidal balanitis: Secondary | ICD-10-CM | POA: Diagnosis not present

## 2014-05-07 DIAGNOSIS — E1159 Type 2 diabetes mellitus with other circulatory complications: Secondary | ICD-10-CM

## 2014-05-07 DIAGNOSIS — B3749 Other urogenital candidiasis: Secondary | ICD-10-CM

## 2014-05-07 LAB — POCT GLYCOSYLATED HEMOGLOBIN (HGB A1C): HEMOGLOBIN A1C: 7.7

## 2014-05-07 MED ORDER — INSULIN ASPART 100 UNIT/ML FLEXPEN: 10.0000 [IU] | PEN_INJECTOR | Freq: Three times a day (TID) | SUBCUTANEOUS | Status: DC

## 2014-05-07 MED ORDER — ROSUVASTATIN CALCIUM 10 MG PO TABS
10.0000 mg | ORAL_TABLET | Freq: Every day | ORAL | Status: DC
Start: 2014-05-07 — End: 2014-09-01

## 2014-05-07 MED ORDER — INSULIN DETEMIR 100 UNIT/ML ~~LOC~~ SOLN: SUBCUTANEOUS | Status: DC

## 2014-05-07 MED ORDER — CANAGLIFLOZIN-METFORMIN HCL 150-1000 MG PO TABS: 1.0000 | ORAL_TABLET | Freq: Two times a day (BID) | ORAL | Status: DC

## 2014-05-07 MED ORDER — OMEGA-3-ACID ETHYL ESTERS 1 G PO CAPS
2.0000 g | ORAL_CAPSULE | Freq: Every day | ORAL | Status: DC
Start: 2014-05-07 — End: 2014-07-23

## 2014-05-07 MED ORDER — PANTOPRAZOLE SODIUM 40 MG PO TBEC: 40.0000 mg | DELAYED_RELEASE_TABLET | Freq: Two times a day (BID) | ORAL | Status: DC

## 2014-05-07 MED ORDER — FLUCONAZOLE 150 MG PO TABS: 150.0000 mg | ORAL_TABLET | Freq: Once | ORAL | Status: DC

## 2014-05-07 MED ORDER — INSULIN PEN NEEDLE 32G X 5 MM MISC: Status: DC

## 2014-05-07 MED ORDER — OLMESARTAN MEDOXOMIL-HCTZ 40-25 MG PO TABS: 1.0000 | ORAL_TABLET | Freq: Every day | ORAL | Status: DC

## 2014-05-07 MED ORDER — ZETIA 10 MG PO TABS: 10.0000 mg | ORAL_TABLET | Freq: Every day | ORAL | Status: DC

## 2014-05-07 MED ORDER — GLUCOSE BLOOD VI STRP: ORAL_STRIP | Status: DC

## 2014-05-07 NOTE — Progress Notes (Signed)
Diabetes Follow-Up Visit Chief Complaint:   Chief Complaint  Patient presents with  . Diabetes    HPI:  Patient has had diabetes for many years.  He is taking InvokaMet 150/1000mg BID, Levemir 48 units qam and 50 units qpm and Novolog 2 to 3 times daily to control DM.  Tolerating Invokamet well but states that he has some redness and itching around penis area. Last A1C was 8.9% (01/19/2014)  Low fat/carbohydrate diet?  Improving but still moments of indiscretion Nicotine Abuse?  No Medication Compliance?  Yes Exercise?  Yes - has started working out with Chad/PT again Alcohol Abuse?  No  Home BG Monitoring:  Checking 2-3 times a day. Average:  132  (am) and 201 (at hs) High: 235 Low:  68  Patient has had 2 lows during the night over the last 6 weeks.  Exam Polyuria:  neg  Polydipsia:  neg  Polyphagia:  neg  BMI:  Body mass index is 33.15 kg/(m^2).   General Appearance:  alert, oriented, no acute distress and well nourished Mood/Affect:  normal   Urine microalbumin - Negative (06/09/2013) Lipid Panel (09/10/2013) LDL-P = 575 Tg= 177 Tc = 128 HDL = 47  Lab Results  Component Value Date   HGBA1C 7.7 05/07/2014    No results found for: MICROALBUR   Assessment: 1.  Diabetes - HBG readings have improved.  2.  Blood Pressure.  Well controlled 3.  Lipids.  Improved - LDL-P at goal, Tg slightly elevated but improved from 205 in December 2014 4.  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 Continue Invokamet 150/1000mg 1 tablet bid with food,  Levemir to 50 units each evening, continue 48 units each morning and Novolog 2.  Diflucan 116m 1 tablet x 1 dose, may reapeat as needed in 5-7 days.  3.  Dietary recommendations:  Reviewed serving size recommendations of high CHO foods.  4.  Physical Activity recommendations:  Continue regular exercise with CMali- goal 30 to 40 min 4 days/week 5. RTC in 4-6 weeks to recheck HBG  Orders Placed This Encounter   Procedures  . CMP14+EGFR  . NMR, lipoprofile  . POCT glycosylated hemoglobin (Hb A1C)     TCherre Robins PharmD, CPP, CDE

## 2014-05-08 ENCOUNTER — Telehealth: Payer: Self-pay | Admitting: Pharmacist

## 2014-05-08 LAB — CMP14+EGFR
ALT: 16 IU/L (ref 0–44)
AST: 17 IU/L (ref 0–40)
Albumin/Globulin Ratio: 1.7 (ref 1.1–2.5)
Albumin: 4.3 g/dL (ref 3.5–4.8)
Alkaline Phosphatase: 53 IU/L (ref 39–117)
BUN/Creatinine Ratio: 18 (ref 10–22)
BUN: 17 mg/dL (ref 8–27)
CHLORIDE: 100 mmol/L (ref 97–108)
CO2: 25 mmol/L (ref 18–29)
Calcium: 9.2 mg/dL (ref 8.6–10.2)
Creatinine, Ser: 0.95 mg/dL (ref 0.76–1.27)
GFR calc Af Amer: 93 mL/min/{1.73_m2} (ref >59)
GFR, EST NON AFRICAN AMERICAN: 81 mL/min/{1.73_m2} (ref >59)
GLUCOSE: 146 mg/dL — AB (ref 65–99)
Globulin, Total: 2.5 g/dL (ref 1.5–4.5)
POTASSIUM: 4.4 mmol/L (ref 3.5–5.2)
Sodium: 141 mmol/L (ref 134–144)
TOTAL PROTEIN: 6.8 g/dL (ref 6.0–8.5)
Total Bilirubin: 0.3 mg/dL (ref 0.0–1.2)

## 2014-05-08 LAB — NMR, LIPOPROFILE
Cholesterol: 146 mg/dL (ref 100–199)
HDL CHOLESTEROL BY NMR: 46 mg/dL (ref >39)
HDL Particle Number: 40.2 umol/L (ref >=30.5)
LDL PARTICLE NUMBER: 708 nmol/L (ref <1000)
LDL Size: 19.9 nm (ref >20.5)
LDL-C: 53 mg/dL (ref 0–99)
LP-IR Score: 51 — ABNORMAL HIGH (ref <=45)
Small LDL Particle Number: 573 nmol/L — ABNORMAL HIGH (ref <=527)
Triglycerides by NMR: 236 mg/dL — ABNORMAL HIGH (ref 0–149)

## 2014-05-08 NOTE — Telephone Encounter (Signed)
Serum creatine WNL. BG elevated but improved from past.  LDL at goal - Triglycerides still elevated but improved - continue with TLC - diet changes and exercise Patient notified.

## 2014-05-27 ENCOUNTER — Other Ambulatory Visit: Payer: Self-pay | Admitting: *Deleted

## 2014-05-27 MED ORDER — GLUCOSE BLOOD VI STRP: ORAL_STRIP | Status: DC

## 2014-05-27 MED ORDER — BLOOD GLUCOSE MONITOR KIT: PACK | Status: DC

## 2014-07-23 ENCOUNTER — Ambulatory Visit (INDEPENDENT_AMBULATORY_CARE_PROVIDER_SITE_OTHER): Payer: Medicare Other | Admitting: Family Medicine

## 2014-07-23 ENCOUNTER — Encounter: Payer: Self-pay | Admitting: Family Medicine

## 2014-07-23 VITALS — BP 132/90 | HR 92 | Temp 97.0°F | Ht 68.0 in | Wt 218.0 lb

## 2014-07-23 DIAGNOSIS — E1159 Type 2 diabetes mellitus with other circulatory complications: Secondary | ICD-10-CM

## 2014-07-23 DIAGNOSIS — E559 Vitamin D deficiency, unspecified: Secondary | ICD-10-CM

## 2014-07-23 DIAGNOSIS — N400 Enlarged prostate without lower urinary tract symptoms: Secondary | ICD-10-CM | POA: Diagnosis not present

## 2014-07-23 DIAGNOSIS — I10 Essential (primary) hypertension: Secondary | ICD-10-CM

## 2014-07-23 DIAGNOSIS — G629 Polyneuropathy, unspecified: Secondary | ICD-10-CM

## 2014-07-23 DIAGNOSIS — M25551 Pain in right hip: Secondary | ICD-10-CM | POA: Diagnosis not present

## 2014-07-23 DIAGNOSIS — E785 Hyperlipidemia, unspecified: Secondary | ICD-10-CM | POA: Diagnosis not present

## 2014-07-23 DIAGNOSIS — K219 Gastro-esophageal reflux disease without esophagitis: Secondary | ICD-10-CM | POA: Diagnosis not present

## 2014-07-23 MED ORDER — INSULIN PEN NEEDLE 32G X 5 MM MISC: Status: DC

## 2014-07-23 MED ORDER — GLUCOSE BLOOD VI STRP: ORAL_STRIP | Status: DC

## 2014-07-23 NOTE — Progress Notes (Signed)
Subjective:    Patient ID: Marvin Frost, male    DOB: 1946/03/04, 69 y.o.   MRN: [id number]  HPI Pt here for follow up and management of chronic medical problems which include diabetes, hyperlipidemia, and hypertension. He is taking medications regularly. The patient brings in outside blood sugars for review. These are running in the 150 range fasting and at bedtime they're running higher in the low 200 range. In January these numbers have been somewhat lower. The patient's lab work will be reviewed with him today. His total LDL particle number was at goal at 708 and the LDL C was good at 53. The triglycerides however were very elevated at 236 and this most likely goes along with his lack of good blood sugar control. His creatinine was good and his potassium electrolytes and liver function tests were good. The big problem with the patient and he understands this is getting his blood sugar under good control and keeping his weight down and we will continue to monitor this closely with him along with the clinical pharmacists. He does complain of right hip and low back pain and some cramping in his feet at nighttime. The patient's recent lab work was reviewed with him today and no changes were implemented other than it is recommended that he had continue follow-up with the clinical pharmacist for blood sugar control.         Patient Active Problem List   Diagnosis Date Noted  . Nonspecific (abnormal) findings on radiological and other examination of gastrointestinal tract 09/27/2010  . NAUSEA AND VOMITING 08/16/2010  . DM 03/18/2009  . HYPERLIPIDEMIA 03/18/2009  . OBESITY 03/18/2009  . HYPERTENSION 03/18/2009  . CAD 03/18/2009  . [facility] 03/18/2009  . GERD 03/18/2009   Outpatient Encounter Prescriptions as of 07/23/2014  Medication Sig  . aspirin 81 MG tablet Take 81 mg by mouth daily.    . Canagliflozin-Metformin HCl [phone] MG TABS Take 1 tablet by mouth 2 (two) times daily.  .  Cholecalciferol (VITAMIN D) 2000 UNITS CAPS Take 1 capsule by mouth daily.  . insulin aspart (NOVOLOG FLEXPEN) 100 UNIT/ML FlexPen Inject 10 Units into the skin 4 (four) times daily -  with meals and at bedtime.  . insulin detemir (LEVEMIR) 100 UNIT/ML injection INJECT 48 UNITS EACH MORNING AND 50 UNITS EACH EVENING  . Multiple Vitamin (MULTIVITAMIN) capsule Take 1 capsule by mouth daily.    Marland Kitchen olmesartan-hydrochlorothiazide (BENICAR HCT) 40-25 MG per tablet Take 1 tablet by mouth daily.  . pantoprazole (PROTONIX) 40 MG tablet Take 1 tablet (40 mg total) by mouth 2 (two) times daily.  . rosuvastatin (CRESTOR) 10 MG tablet Take 1 tablet (10 mg total) by mouth daily.  Marland Kitchen ZETIA 10 MG tablet Take 1 tablet (10 mg total) by mouth daily.  . [DISCONTINUED] omega-3 acid ethyl esters (LOVAZA) 1 G capsule Take 2 capsules (2 g total) by mouth daily.  . [DISCONTINUED] Blood Glucose Monitoring Suppl (BLOOD GLUCOSE METER KIT AND SUPPLIES) KIT Dispense based on patient and insurance preference. Use up to four times daily as directed. (FOR ICD-9 250.00, 250.01).  . [DISCONTINUED] fluconazole (DIFLUCAN) 150 MG tablet Take 1 tablet (150 mg total) by mouth once. Repeat as needed in 5 to 7 days  . [DISCONTINUED] glucose blood (ONE TOUCH ULTRA TEST) test strip USE TO CHECK BLOOD GLUCOSE BID and PRN.Precision EXTRA-strips dx. 250.0  . [DISCONTINUED] glucose blood test strip Use as instructed  . [DISCONTINUED] Insulin Pen Needle (NOVOTWIST) 32G X 5 MM MISC  Use with insulin pens up to 5 times per day    Review of Systems  Constitutional: Negative.   HENT: Negative.   Eyes: Negative.   Respiratory: Negative.   Cardiovascular: Negative.   Gastrointestinal: Negative.   Endocrine: Negative.   Genitourinary: Negative.   Musculoskeletal: Positive for myalgias (feet and toes cramp at night) and arthralgias (right hip and low back ).  Skin: Negative.   Allergic/Immunologic: Negative.   Neurological: Negative.     Hematological: Negative.   Psychiatric/Behavioral: Negative.        Objective:   Physical Exam  Constitutional: He is oriented to person, place, and time. He appears well-developed and well-nourished. No distress.  HENT:  Head: Normocephalic and atraumatic.  Right Ear: External ear normal.  Left Ear: External ear normal.  Nose: Nose normal.  Mouth/Throat: Oropharynx is clear and moist. No oropharyngeal exudate.  No carotid bruits  Eyes: Conjunctivae and EOM are normal. Pupils are equal, round, and reactive to light. Right eye exhibits no discharge. Left eye exhibits no discharge. No scleral icterus.  Neck: Normal range of motion. Neck supple. No tracheal deviation present. No thyromegaly present.  No carotid bruits or anterior cervical adenopathy  Cardiovascular: Normal rate, regular rhythm, normal heart sounds and intact distal pulses.  Exam reveals no friction rub.   No murmur heard. The heart has a regular rate and rhythm at 72/m without murmurs  Pulmonary/Chest: Effort normal and breath sounds normal. No respiratory distress. He has no wheezes. He has no rales. He exhibits no tenderness.  Abdominal: Soft. Bowel sounds are normal. He exhibits no mass. There is no tenderness. There is no rebound and no guarding.  The abdomen is obese without masses or tenderness  Musculoskeletal: Normal range of motion. He exhibits no edema or tenderness.  With the flexion of the hip and abduction of the hip there was some lateral hip and groin pain on the right side.  Lymphadenopathy:    He has no cervical adenopathy.  Neurological: He is alert and oriented to person, place, and time. He has normal reflexes. No cranial nerve deficit.  Skin: Skin is warm and dry. No rash noted. No erythema. No pallor.  Psychiatric: He has a normal mood and affect. His behavior is normal. Judgment and thought content normal.  Nursing note and vitals reviewed.  BP 158/91 mmHg  Pulse 92  Temp(Src) 97 F (36.1 C)  (Oral)  Ht 5' 8"  (1.727 m)  Wt 218 lb (98.884 kg)  BMI 33.15 kg/m2  Repeat blood pressure was 132/90 sitting in the right arm with a regular cuff      Assessment & Plan:  1. Type 2 diabetes mellitus with other circulatory complications -Continue regular follow-up with the clinical pharmacists and always bring blood sugars skin for review when you see her - POCT CBC; Future - POCT glycosylated hemoglobin (Hb A1C); Future  2. Vitamin D deficiency -Continue current treatment and any adjustments in this medication will be made after pending lab work is reviewed - POCT CBC; Future - Vit D  25 hydroxy (rtn osteoporosis monitoring); Future  3. BPH (benign prostatic hyperplasia) - POCT CBC; Future  4. Hypertension -Continue current medication and weight loss and sodium restriction - POCT CBC; Future - BMP8+EGFR; Future - Hepatic function panel; Future  5. Gastroesophageal reflux disease, esophagitis presence not specified -He is having no problems with this and pantoprazole should be continued - POCT CBC; Future - Hepatic function panel; Future  6. Hyperlipidemia -Lab work from  November was reviewed and his total LDL particle number and LDL C were good and he should continue with his current medication. - POCT CBC; Future - NMR, lipoprofile; Future  7. Right hip pain -We will resume physical therapy and we'll arrange an appointment with the orthopedic surgeon in February - Ambulatory referral to Physical Therapy  8. Neuropathy -Physical therapy and orthopedic evaluation - Ambulatory referral to Physical Therapy  Meds ordered this encounter  Medications  . glucose blood (ONE TOUCH ULTRA TEST) test strip    Sig: USE TO CHECK BLOOD GLUCOSE BID and PRN. For one touch ultra micro reader. dx. 250.0    Dispense:  300 each    Refill:  3  . Insulin Pen Needle (NOVOTWIST) 32G X 5 MM MISC    Sig: Use with insulin pens up to 5 times per day.dx.250.0    Dispense:  300 each     Refill:  3   Patient Instructions                       Medicare Annual Wellness Visit  [facility] and the medical providers at [facility] strive to bring you the best medical care.  In doing so we not only want to address your current medical conditions and concerns but also to detect new conditions early and prevent illness, disease and health-related problems.    Medicare offers a yearly Wellness Visit which allows our clinical staff to assess your need for preventative services including immunizations, lifestyle education, counseling to decrease risk of preventable diseases and screening for fall risk and other medical concerns.    This visit is provided free of charge (no copay) for all Medicare recipients. The clinical pharmacists at [facility] have begun to conduct these Wellness Visits which will also include a thorough review of all your medications.    As you primary medical provider recommend that you make an appointment for your Annual Wellness Visit if you have not done so already this year.  You may set up this appointment before you leave today or you may call back ([phone]) and schedule an appointment.  Please make sure when you call that you mention that you are scheduling your Annual Wellness Visit with the clinical pharmacist so that the appointment may be made for the proper length of time.     Continue current medications. Continue good therapeutic lifestyle changes which include good diet and exercise. Fall precautions discussed with patient. If an FOBT was given today- please return it to our front desk. If you are over 53 years old - you may need Prevnar 37 or the adult Pneumonia vaccine.  Flu Shots will be available at our office starting mid- September. Please call and schedule a FLU CLINIC APPOINTMENT.   Come between 08-08-14 and 08-23-14 for labs - orders have been placed Please fill out your practice evaluation for  when you receive this in the mail or by email We will arrange for you to see the follow-up visit with Dr. Orlean Patten and We will also arrange for you to have physical therapy for your right hip pain We will also schedule a visit for you to see the orthopedist regarding her right hip Continue to stay as active as possible, keep blood sugars under control and monitor your feet   Arrie Senate MD

## 2014-07-23 NOTE — Patient Instructions (Addendum)
Medicare Annual Wellness Visit  [facility] and the medical providers at [facility] strive to bring you the best medical care.  In doing so we not only want to address your current medical conditions and concerns but also to detect new conditions early and prevent illness, disease and health-related problems.    Medicare offers a yearly Wellness Visit which allows our clinical staff to assess your need for preventative services including immunizations, lifestyle education, counseling to decrease risk of preventable diseases and screening for fall risk and other medical concerns.    This visit is provided free of charge (no copay) for all Medicare recipients. The clinical pharmacists at [facility] have begun to conduct these Wellness Visits which will also include a thorough review of all your medications.    As you primary medical provider recommend that you make an appointment for your Annual Wellness Visit if you have not done so already this year.  You may set up this appointment before you leave today or you may call back ([phone]) and schedule an appointment.  Please make sure when you call that you mention that you are scheduling your Annual Wellness Visit with the clinical pharmacist so that the appointment may be made for the proper length of time.     Continue current medications. Continue good therapeutic lifestyle changes which include good diet and exercise. Fall precautions discussed with patient. If an FOBT was given today- please return it to our front desk. If you are over 43 years old - you may need Prevnar 31 or the adult Pneumonia vaccine.  Flu Shots will be available at our office starting mid- September. Please call and schedule a FLU CLINIC APPOINTMENT.   Come between 08-08-14 and 08-23-14 for labs - orders have been placed Please fill out your practice evaluation for when you receive this in the mail or by  email We will arrange for you to see the follow-up visit with Dr. Orlean Patten and We will also arrange for you to have physical therapy for your right hip pain We will also schedule a visit for you to see the orthopedist regarding her right hip Continue to stay as active as possible, keep blood sugars under control and monitor your feet

## 2014-07-28 ENCOUNTER — Other Ambulatory Visit: Payer: Self-pay | Admitting: *Deleted

## 2014-07-28 MED ORDER — FREESTYLE LITE DEVI: Status: DC

## 2014-07-28 MED ORDER — GLUCOSE BLOOD VI STRP: ORAL_STRIP | Status: DC

## 2014-07-28 MED ORDER — FREESTYLE LANCETS MISC: Status: DC

## 2014-07-29 ENCOUNTER — Telehealth: Payer: Self-pay | Admitting: Family Medicine

## 2014-07-29 ENCOUNTER — Other Ambulatory Visit: Payer: Self-pay | Admitting: *Deleted

## 2014-07-29 MED ORDER — FREESTYLE LITE DEVI: Status: DC

## 2014-08-05 ENCOUNTER — Ambulatory Visit (INDEPENDENT_AMBULATORY_CARE_PROVIDER_SITE_OTHER): Payer: Medicare Other | Admitting: Cardiology

## 2014-08-05 ENCOUNTER — Encounter: Payer: Self-pay | Admitting: Cardiology

## 2014-08-05 ENCOUNTER — Ambulatory Visit: Payer: Medicare Other | Attending: Family Medicine | Admitting: Physical Therapy

## 2014-08-05 VITALS — BP 112/62 | HR 94 | Ht 68.0 in | Wt 217.0 lb

## 2014-08-05 DIAGNOSIS — K219 Gastro-esophageal reflux disease without esophagitis: Secondary | ICD-10-CM | POA: Diagnosis not present

## 2014-08-05 DIAGNOSIS — I10 Essential (primary) hypertension: Secondary | ICD-10-CM

## 2014-08-05 DIAGNOSIS — E785 Hyperlipidemia, unspecified: Secondary | ICD-10-CM | POA: Insufficient documentation

## 2014-08-05 DIAGNOSIS — M25551 Pain in right hip: Secondary | ICD-10-CM | POA: Diagnosis not present

## 2014-08-05 DIAGNOSIS — E119 Type 2 diabetes mellitus without complications: Secondary | ICD-10-CM | POA: Diagnosis not present

## 2014-08-05 DIAGNOSIS — I2510 Atherosclerotic heart disease of native coronary artery without angina pectoris: Secondary | ICD-10-CM | POA: Diagnosis not present

## 2014-08-05 DIAGNOSIS — E669 Obesity, unspecified: Secondary | ICD-10-CM | POA: Diagnosis not present

## 2014-08-05 NOTE — Patient Instructions (Signed)
The current medical regimen is effective;  continue present plan and medications.  Follow up in 1 year with Dr. Skains.  You will receive a letter in the mail 2 months before you are due.  Please call us when you receive this letter to schedule your follow up appointment.  Thank you for choosing [facility]!!     

## 2014-08-05 NOTE — Progress Notes (Signed)
HPI The patient presents for follow up of nonobstructive CAD.  His last cath was 2004. His last stress test 2013. Since I last saw him he has done well.  The patient denies any new symptoms such as chest discomfort, neck or arm discomfort. There has been no new shortness of breath, PND or orthopnea. There have been no reported palpitations, presyncope or syncope.  He is limited by some hip pain.  He is doing PT.   Allergies  Allergen Reactions  . Baxinets [Cetylpyridinium-Benzocaine]   . Celebrex [Celecoxib] Nausea Only  . Clarithromycin   . Liraglutide Other (See Comments)    Abdominal pain and enlarged pancreas on CT ? pancreatitis  . Statins   . Toprol Xl [Metoprolol Succinate]   . Triplex Ad Other (See Comments)    Abdominal pain and enlarged pancreas on CT? pancreatitis  . Welchol [Colesevelam Hcl] Other (See Comments)    constipation  . Norvasc [Amlodipine Besylate] Rash    Current Outpatient Prescriptions  Medication Sig Dispense Refill  . aspirin 81 MG tablet Take 81 mg by mouth daily.      . Canagliflozin-Metformin HCl [phone] MG TABS Take 1 tablet by mouth 2 (two) times daily. 180 tablet 1  . Cholecalciferol (VITAMIN D) 2000 UNITS CAPS Take 1 capsule by mouth daily.    . insulin aspart (NOVOLOG FLEXPEN) 100 UNIT/ML FlexPen Inject 10 Units into the skin 4 (four) times daily -  with meals and at bedtime. 30 mL 3  . insulin detemir (LEVEMIR) 100 UNIT/ML injection INJECT 48 UNITS EACH MORNING AND 50 UNITS EACH EVENING 90 mL 3  . Multiple Vitamin (MULTIVITAMIN) capsule Take 1 capsule by mouth daily.      Marland Kitchen olmesartan-hydrochlorothiazide (BENICAR HCT) 40-25 MG per tablet Take 1 tablet by mouth daily. 90 tablet 3  . pantoprazole (PROTONIX) 40 MG tablet Take 1 tablet (40 mg total) by mouth 2 (two) times daily. 180 tablet 3  . rosuvastatin (CRESTOR) 10 MG tablet Take 1 tablet (10 mg total) by mouth daily. (Patient taking differently: Take 10 mg by mouth every other day. ) 90  tablet 3  . ZETIA 10 MG tablet Take 1 tablet (10 mg total) by mouth daily. 90 tablet 3  . Blood Glucose Monitoring Suppl (FREESTYLE LITE) DEVI Use to check BS bid. DX. E11.9 1 each 0  . glucose blood (FREESTYLE LITE) test strip Check BS BID and PRN. DX. E11.9 300 each 3  . Insulin Pen Needle (NOVOTWIST) 32G X 5 MM MISC Use with insulin pens up to 5 times per day.dx.250.0 300 each 3  . Lancets (FREESTYLE) lancets Check BS BID and PRN. DX.E11.9 300 each 3   No current facility-administered medications for this visit.    Past Medical History  Diagnosis Date  . CAD (coronary artery disease)     Nonobstructive Cath 2004  . Hypertension   . Diabetes mellitus   . Hyperlipemia   . GERD (gastroesophageal reflux disease)   . Obesity   . Pancreatitis     Past Surgical History  Procedure Laterality Date  . Right shoulder surgery    . Hydrocele excision / repair    . Colonoscopy  11/2003    diverticulosis, no polyps  . Upper gastrointestinal endoscopy  08/19/2010    hiatal hernia 2cm, mild gastritis    ROS:  Feet cramping.  Otherwise as stated in the HPI and negative for all other systems.  PHYSICAL EXAM BP 112/62 mmHg  Pulse 94  Ht 5'  8" (1.727 m)  Wt 217 lb (98.431 kg)  BMI 33.00 kg/m2 GENERAL:  Well appearing NECK:  No jugular venous distention, waveform within normal limits, carotid upstroke brisk and symmetric, no bruits, no thyromegaly LUNGS:  Clear to auscultation bilaterally CHEST:  Unremarkable HEART:  PMI not displaced or sustained,S1 and S2 within normal limits, no S3, no S4, no clicks, no rubs, no murmurs ABD:  Flat, positive bowel sounds normal in frequency in pitch, no bruits, no rebound, no guarding, no midline pulsatile mass, no hepatomegaly, no splenomegaly, obese EXT:  2 plus pulses throughout, no edema, no cyanosis no clubbing  EKG: Normal sinus rhythm, rate 94, left axis views poor anterior R-wave progression, T-wave changes.  08/05/2014  ASSESSMENT AND  PLAN  CAD -  The patient has no new sypmtoms.  No further cardiovascular testing is indicated.  We will continue with aggressive risk reduction and meds as listed.  HYPERLIPIDEMIA -  I reviewed this. It is followed closely by Dr. Laurance Flatten.  He thinks that the statin is causing his limb pain.  He will discuss this with Dr. Laurance Flatten  HYPERTENSION -  The blood pressure is at target. No change in medications is indicated. We will continue with therapeutic lifestyle changes (TLC).  DM -  His A1C was 7.7.  We discussed diet and exercise to treat this.

## 2014-08-12 ENCOUNTER — Encounter: Payer: Self-pay | Admitting: Physical Therapy

## 2014-08-12 ENCOUNTER — Ambulatory Visit: Payer: Medicare Other | Admitting: Physical Therapy

## 2014-08-12 DIAGNOSIS — M25551 Pain in right hip: Secondary | ICD-10-CM

## 2014-08-12 DIAGNOSIS — K219 Gastro-esophageal reflux disease without esophagitis: Secondary | ICD-10-CM | POA: Diagnosis not present

## 2014-08-12 DIAGNOSIS — E669 Obesity, unspecified: Secondary | ICD-10-CM | POA: Diagnosis not present

## 2014-08-12 DIAGNOSIS — E119 Type 2 diabetes mellitus without complications: Secondary | ICD-10-CM | POA: Diagnosis not present

## 2014-08-12 DIAGNOSIS — I2510 Atherosclerotic heart disease of native coronary artery without angina pectoris: Secondary | ICD-10-CM | POA: Diagnosis not present

## 2014-08-12 DIAGNOSIS — I10 Essential (primary) hypertension: Secondary | ICD-10-CM | POA: Diagnosis not present

## 2014-08-12 NOTE — Therapy (Signed)
[facility] [place], [place], [address] Phone: [phone]   Fax:  [phone]  Physical Therapy Treatment  Patient Details  Name: Marvin Frost MRN: [id number] Date of Birth: April 18, 1946 Referring Provider:  Chipper Herb, MD  Encounter Date: 08/12/2014      PT End of Session - 08/12/14 0959    Visit Number 2   Number of Visits 12   PT Start Time 0902   PT Stop Time 0955   PT Time Calculation (min) 53 min      Past Medical History  Diagnosis Date  . CAD (coronary artery disease)     Nonobstructive Cath 2004  . Hypertension   . Diabetes mellitus   . Hyperlipemia   . GERD (gastroesophageal reflux disease)   . Obesity   . Pancreatitis     Past Surgical History  Procedure Laterality Date  . Right shoulder surgery    . Hydrocele excision / repair    . Colonoscopy  11/2003    diverticulosis, no polyps  . Upper gastrointestinal endoscopy  08/19/2010    hiatal hernia 2cm, mild gastritis    There were no vitals taken for this visit.  Visit Diagnosis:  Hip pain, right      Subjective Assessment - 08/12/14 0942    Symptoms Less pain today compared to last week which was 8/10 pain   Currently in Pain? Yes   Pain Score 5    Pain Location Hip   Pain Orientation Right   Pain Descriptors / Indicators Aching                    [facility] Adult PT Treatment/Exercise - 08/12/14 0001    Modalities   Modalities Traction;Iontophoresis   Iontophoresis   Type of Iontophoresis Dexamethasone   Location Rt hip   Dose 1.0ML   Time 8 min   Traction   Type of Traction Lumbar   Min (lbs) 10   Max (lbs) 90   Hold Time 99   Rest Time 5   Time 15   Manual Therapy   Manual Therapy Myofascial release   Myofascial Release STW/gentle Myofascial release to hip/IT band                     PT Long Term Goals - 08/12/14 1008    PT LONG TERM GOAL #1   Title demonstrate and/or verbalize techniques to  reduce the risk of re-injury to include info on: physical activity   Time 8   Period Weeks   PT LONG TERM GOAL #2   Title be independent with advanced HEP   Time 8   Period Weeks   PT LONG TERM GOAL #3   Title perform ADL's with pain not > 3/10   Time 8   Period Weeks   PT LONG TERM GOAL #4   Title eliminate right LE symptoms   Time 8   Period Weeks               Plan - 08/12/14 1003    Clinical Impression Statement Pt felt better after tx today, less pain reported after tx. No antalgic gait today   PT Next Visit Plan Continue with POC per MPT, increse traction and cont ionto if pt tolerated well        Problem List Patient Active Problem List   Diagnosis Date Noted  . Nonspecific (abnormal) findings on radiological and other examination of gastrointestinal tract 09/27/2010  .  NAUSEA AND VOMITING 08/16/2010  . DM 03/18/2009  . HYPERLIPIDEMIA 03/18/2009  . OBESITY 03/18/2009  . Essential hypertension 03/18/2009  . Coronary atherosclerosis 03/18/2009  . [facility] 03/18/2009  . GERD 03/18/2009    Phillips Climes, PTA 08/12/2014, 10:21 AM  [facility] [address] [place], [place], [address] Phone: [phone]   Fax:  [phone]

## 2014-08-18 ENCOUNTER — Telehealth: Payer: Self-pay | Admitting: *Deleted

## 2014-08-18 ENCOUNTER — Other Ambulatory Visit (INDEPENDENT_AMBULATORY_CARE_PROVIDER_SITE_OTHER): Payer: Medicare Other

## 2014-08-18 DIAGNOSIS — E559 Vitamin D deficiency, unspecified: Secondary | ICD-10-CM | POA: Diagnosis not present

## 2014-08-18 DIAGNOSIS — N400 Enlarged prostate without lower urinary tract symptoms: Secondary | ICD-10-CM

## 2014-08-18 DIAGNOSIS — K219 Gastro-esophageal reflux disease without esophagitis: Secondary | ICD-10-CM | POA: Diagnosis not present

## 2014-08-18 DIAGNOSIS — I10 Essential (primary) hypertension: Secondary | ICD-10-CM | POA: Diagnosis not present

## 2014-08-18 DIAGNOSIS — E785 Hyperlipidemia, unspecified: Secondary | ICD-10-CM

## 2014-08-18 DIAGNOSIS — E1159 Type 2 diabetes mellitus with other circulatory complications: Secondary | ICD-10-CM

## 2014-08-18 LAB — POCT CBC
GRANULOCYTE PERCENT: 77.3 % (ref 37–80)
HCT, POC: 43.9 % (ref 43.5–53.7)
Hemoglobin: 13.4 g/dL — AB (ref 14.1–18.1)
LYMPH, POC: 2 (ref 0.6–3.4)
MCH, POC: 24.7 pg — AB (ref 27–31.2)
MCHC: 30.6 g/dL — AB (ref 31.8–35.4)
MCV: 80.8 fL (ref 80–97)
MPV: 9.6 fL (ref 0–99.8)
PLATELET COUNT, POC: 318 10*3/uL (ref 142–424)
POC Granulocyte: 7.4 — AB (ref 2–6.9)
POC LYMPH PERCENT: 21.3 %L (ref 10–50)
RBC: 5.43 M/uL (ref 4.69–6.13)
RDW, POC: 16.4 %
WBC: 9.6 10*3/uL (ref 4.6–10.2)

## 2014-08-18 LAB — POCT GLYCOSYLATED HEMOGLOBIN (HGB A1C): HEMOGLOBIN A1C: 8.8

## 2014-08-18 MED ORDER — EMPAGLIFLOZIN-METFORMIN HCL 12.5-1000 MG PO TABS: 1.0000 | ORAL_TABLET | Freq: Two times a day (BID) | ORAL | Status: DC

## 2014-08-18 NOTE — Progress Notes (Signed)
Lab only 

## 2014-08-18 NOTE — Telephone Encounter (Signed)
Marvin Frost which you address this for Marvin Frost thank you

## 2014-08-18 NOTE — Telephone Encounter (Signed)
Invokamet was discontinued and Synjardy sent into [facility] [employer].  Patient to take the same as Invokamet 1 tablet twice a day with food.

## 2014-08-18 NOTE — Telephone Encounter (Addendum)
Dr Laurance Flatten, Jeneen Rinks' insurance prefers that he take jardiance , glyxambi, or synjardy will either of these work instead of invokamet, Thanks for your help!

## 2014-08-19 ENCOUNTER — Ambulatory Visit: Payer: Medicare Other | Admitting: *Deleted

## 2014-08-19 ENCOUNTER — Encounter: Payer: Self-pay | Admitting: *Deleted

## 2014-08-19 DIAGNOSIS — I10 Essential (primary) hypertension: Secondary | ICD-10-CM | POA: Diagnosis not present

## 2014-08-19 DIAGNOSIS — I2510 Atherosclerotic heart disease of native coronary artery without angina pectoris: Secondary | ICD-10-CM | POA: Diagnosis not present

## 2014-08-19 DIAGNOSIS — M25551 Pain in right hip: Secondary | ICD-10-CM | POA: Diagnosis not present

## 2014-08-19 DIAGNOSIS — E669 Obesity, unspecified: Secondary | ICD-10-CM | POA: Diagnosis not present

## 2014-08-19 DIAGNOSIS — K219 Gastro-esophageal reflux disease without esophagitis: Secondary | ICD-10-CM | POA: Diagnosis not present

## 2014-08-19 DIAGNOSIS — E119 Type 2 diabetes mellitus without complications: Secondary | ICD-10-CM | POA: Diagnosis not present

## 2014-08-19 LAB — BMP8+EGFR
BUN/Creatinine Ratio: 22 (ref 10–22)
BUN: 22 mg/dL (ref 8–27)
CALCIUM: 9.5 mg/dL (ref 8.6–10.2)
CHLORIDE: 96 mmol/L — AB (ref 97–108)
CO2: 24 mmol/L (ref 18–29)
Creatinine, Ser: 1.02 mg/dL (ref 0.76–1.27)
GFR calc Af Amer: 86 mL/min/{1.73_m2} (ref >59)
GFR calc non Af Amer: 74 mL/min/{1.73_m2} (ref >59)
GLUCOSE: 150 mg/dL — AB (ref 65–99)
POTASSIUM: 4.3 mmol/L (ref 3.5–5.2)
Sodium: 137 mmol/L (ref 134–144)

## 2014-08-19 LAB — HEPATIC FUNCTION PANEL
ALK PHOS: 60 IU/L (ref 39–117)
ALT: 16 IU/L (ref 0–44)
AST: 16 IU/L (ref 0–40)
Albumin: 4.1 g/dL (ref 3.5–4.8)
BILIRUBIN TOTAL: 0.4 mg/dL (ref 0.0–1.2)
Bilirubin, Direct: 0.1 mg/dL (ref 0.00–0.40)
TOTAL PROTEIN: 6.7 g/dL (ref 6.0–8.5)

## 2014-08-19 LAB — NMR, LIPOPROFILE
Cholesterol: 307 mg/dL — ABNORMAL HIGH (ref 100–199)
HDL Cholesterol by NMR: 42 mg/dL (ref >39)
HDL PARTICLE NUMBER: 35.3 umol/L (ref >=30.5)
LDL Particle Number: 2517 nmol/L — ABNORMAL HIGH (ref <1000)
LDL Size: 19.6 nm (ref >20.5)
LP-IR SCORE: 71 — AB (ref <=45)
SMALL LDL PARTICLE NUMBER: 2094 nmol/L — AB (ref <=527)
Triglycerides by NMR: 539 mg/dL — ABNORMAL HIGH (ref 0–149)

## 2014-08-19 LAB — VITAMIN D 25 HYDROXY (VIT D DEFICIENCY, FRACTURES): Vit D, 25-Hydroxy: 25.2 ng/mL — ABNORMAL LOW (ref 30.0–100.0)

## 2014-08-19 NOTE — Therapy (Signed)
[facility] [place], [place], [address] Phone: [phone]   Fax:  [phone]  Physical Therapy Treatment  Patient Details  Name: Marvin Frost MRN: [id number] Date of Birth: Oct 23, 1945 Referring Provider:  Chipper Herb, MD  Encounter Date: 08/19/2014      PT End of Session - 08/19/14 0920    Visit Number 3   Number of Visits 12   PT Start Time 0814   PT Stop Time 0908   PT Time Calculation (min) 54 min      Past Medical History  Diagnosis Date  . CAD (coronary artery disease)     Nonobstructive Cath 2004  . Hypertension   . Diabetes mellitus   . Hyperlipemia   . GERD (gastroesophageal reflux disease)   . Obesity   . Pancreatitis     Past Surgical History  Procedure Laterality Date  . Right shoulder surgery    . Hydrocele excision / repair    . Colonoscopy  11/2003    diverticulosis, no polyps  . Upper gastrointestinal endoscopy  08/19/2010    hiatal hernia 2cm, mild gastritis    There were no vitals taken for this visit.  Visit Diagnosis:  Hip pain, right      Subjective Assessment - 08/19/14 0857    Symptoms Pt. feels that last Rx helped. Pain is less   Currently in Pain? Yes   Pain Score 4    Pain Location Hip   Pain Orientation Right   Aggravating Factors  Daily Activities   Pain Relieving Factors Treatments                    [facility] Adult PT Treatment/Exercise - 08/19/14 0001    Iontophoresis   Type of Iontophoresis Dexamethasone   Location Rt hip   Dose 1.0ML   Time 8 min   Traction   Type of Traction Lumbar   Min (lbs) 10   Max (lbs) 95   Hold Time 99   Rest Time 5   Time 15   Manual Therapy   Manual Therapy Myofascial release   Myofascial Release Instrument Assisted soft tissue work on RT HIP and ITB with Pt. LT sidelying and then myofascial stretching                     PT Long Term Goals - 08/12/14 1008    PT LONG TERM GOAL #1   Title  demonstrate and/or verbalize techniques to reduce the risk of re-injury to include info on: physical activity   Time 8   Period Weeks   PT LONG TERM GOAL #2   Title be independent with advanced HEP   Time 8   Period Weeks   PT LONG TERM GOAL #3   Title perform ADL's with pain not > 3/10   Time 8   Period Weeks   PT LONG TERM GOAL #4   Title eliminate right LE symptoms   Time 8   Period Weeks               Plan - 08/19/14 3419    Clinical Impression Statement Pt. did great today and feels that Rx is helping   PT Next Visit Plan Continue with current RX         Problem List Patient Active Problem List   Diagnosis Date Noted  . Nonspecific (abnormal) findings on radiological and other examination of gastrointestinal tract 09/27/2010  . NAUSEA AND VOMITING  08/16/2010  . DM 03/18/2009  . HYPERLIPIDEMIA 03/18/2009  . OBESITY 03/18/2009  . Essential hypertension 03/18/2009  . Coronary atherosclerosis 03/18/2009  . [facility] 03/18/2009  . GERD 03/18/2009    Emerson Barretto,CHRIS PTA 08/19/2014, 9:32 AM  [facility] [address] [place], [place], [address] Phone: [phone]   Fax:  [phone]

## 2014-08-25 ENCOUNTER — Encounter: Payer: Self-pay | Admitting: Physical Therapy

## 2014-08-25 ENCOUNTER — Ambulatory Visit: Payer: Medicare Other | Attending: Family Medicine | Admitting: Physical Therapy

## 2014-08-25 ENCOUNTER — Telehealth: Payer: Self-pay | Admitting: *Deleted

## 2014-08-25 DIAGNOSIS — E669 Obesity, unspecified: Secondary | ICD-10-CM | POA: Insufficient documentation

## 2014-08-25 DIAGNOSIS — K219 Gastro-esophageal reflux disease without esophagitis: Secondary | ICD-10-CM | POA: Insufficient documentation

## 2014-08-25 DIAGNOSIS — E785 Hyperlipidemia, unspecified: Secondary | ICD-10-CM | POA: Insufficient documentation

## 2014-08-25 DIAGNOSIS — I2510 Atherosclerotic heart disease of native coronary artery without angina pectoris: Secondary | ICD-10-CM | POA: Insufficient documentation

## 2014-08-25 DIAGNOSIS — E119 Type 2 diabetes mellitus without complications: Secondary | ICD-10-CM | POA: Insufficient documentation

## 2014-08-25 DIAGNOSIS — I10 Essential (primary) hypertension: Secondary | ICD-10-CM | POA: Diagnosis not present

## 2014-08-25 DIAGNOSIS — M25551 Pain in right hip: Secondary | ICD-10-CM | POA: Diagnosis not present

## 2014-08-25 NOTE — Telephone Encounter (Signed)
Pt notified of medication change and understands and is agreeable with it.

## 2014-08-25 NOTE — Therapy (Signed)
[facility] [place], [place], [address] Phone: [phone]   Fax:  [phone]  Physical Therapy Treatment  Patient Details  Name: Marvin Frost MRN: [id number] Date of Birth: Oct 17, 1945 Referring Provider:  Chipper Herb, MD  Encounter Date: 08/25/2014      PT End of Session - 08/25/14 0901    Visit Number 4   Number of Visits 12   PT Start Time 0815   PT Stop Time 0905   PT Time Calculation (min) 50 min      Past Medical History  Diagnosis Date  . CAD (coronary artery disease)     Nonobstructive Cath 2004  . Hypertension   . Diabetes mellitus   . Hyperlipemia   . GERD (gastroesophageal reflux disease)   . Obesity   . Pancreatitis     Past Surgical History  Procedure Laterality Date  . Right shoulder surgery    . Hydrocele excision / repair    . Colonoscopy  11/2003    diverticulosis, no polyps  . Upper gastrointestinal endoscopy  08/19/2010    hiatal hernia 2cm, mild gastritis    There were no vitals taken for this visit.  Visit Diagnosis:  Hip pain, right      Subjective Assessment - 08/25/14 0817    Symptoms toterated tx well last time with no pain increase   Currently in Pain? Yes   Pain Score 2    Pain Location Hip   Pain Orientation Right   Pain Descriptors / Indicators Aching                    [facility] Adult PT Treatment/Exercise - 08/25/14 0001    Iontophoresis   Type of Iontophoresis Dexamethasone  3 of 6   Location Rt hip   Dose 1.0ML   Time 8 min   Traction   Type of Traction Lumbar   Min (lbs) 10   Max (lbs) 105   Hold Time 99   Rest Time 5   Time 15   Manual Therapy   Manual Therapy Myofascial release   Myofascial Release STW/myofascial release to hip/IT band                PT Education - 08/25/14 0915    Education provided Yes   Education Details Physical Activity   Person(s) Educated Patient   Methods Explanation;Demonstration   Comprehension  Verbalized understanding;Returned demonstration             PT Long Term Goals - 08/25/14 0914    PT LONG TERM GOAL #1   Title demonstrate and/or verbalize techniques to reduce the risk of re-injury to include info on: physical activity   Time 8   Period Weeks   Status Achieved   PT LONG TERM GOAL #2   Title be independent with advanced HEP   Time 8   Period Weeks   Status On-going   PT LONG TERM GOAL #3   Title perform ADL's with pain not > 3/10   Time 8   Period Weeks   Status On-going   PT LONG TERM GOAL #4   Title eliminate right LE symptoms   Time 8   Period Weeks   Status On-going               Plan - 08/25/14 0904    Clinical Impression Statement tolerated tx well today. incresed tx per MPT today.has reported less pain overall and understands rest with activity  that will increse pain. Met LTG #1 others ongoing   PT Next Visit Plan cont with POC    Consulted and Agree with Plan of Care Patient        Problem List Patient Active Problem List   Diagnosis Date Noted  . Nonspecific (abnormal) findings on radiological and other examination of gastrointestinal tract 09/27/2010  . NAUSEA AND VOMITING 08/16/2010  . DM 03/18/2009  . HYPERLIPIDEMIA 03/18/2009  . OBESITY 03/18/2009  . Essential hypertension 03/18/2009  . Coronary atherosclerosis 03/18/2009  . [facility] 03/18/2009  . GERD 03/18/2009    Phillips Climes, PTA 08/25/2014, 9:17 AM  [facility] [place], [place], [address] Phone: [phone]   Fax:  [phone]

## 2014-09-01 ENCOUNTER — Ambulatory Visit (INDEPENDENT_AMBULATORY_CARE_PROVIDER_SITE_OTHER): Payer: Medicare Other | Admitting: Pharmacist Clinician (PhC)/ Clinical Pharmacy Specialist

## 2014-09-01 ENCOUNTER — Ambulatory Visit: Payer: Medicare Other | Admitting: Physical Therapy

## 2014-09-01 ENCOUNTER — Encounter: Payer: Self-pay | Admitting: Pharmacist Clinician (PhC)/ Clinical Pharmacy Specialist

## 2014-09-01 ENCOUNTER — Encounter: Payer: Self-pay | Admitting: Physical Therapy

## 2014-09-01 DIAGNOSIS — E782 Mixed hyperlipidemia: Secondary | ICD-10-CM | POA: Diagnosis not present

## 2014-09-01 DIAGNOSIS — B372 Candidiasis of skin and nail: Secondary | ICD-10-CM

## 2014-09-01 DIAGNOSIS — N521 Erectile dysfunction due to diseases classified elsewhere: Secondary | ICD-10-CM | POA: Diagnosis not present

## 2014-09-01 DIAGNOSIS — M25551 Pain in right hip: Secondary | ICD-10-CM

## 2014-09-01 DIAGNOSIS — L659 Nonscarring hair loss, unspecified: Secondary | ICD-10-CM

## 2014-09-01 DIAGNOSIS — I10 Essential (primary) hypertension: Secondary | ICD-10-CM | POA: Diagnosis not present

## 2014-09-01 DIAGNOSIS — Z8679 Personal history of other diseases of the circulatory system: Secondary | ICD-10-CM

## 2014-09-01 DIAGNOSIS — E1169 Type 2 diabetes mellitus with other specified complication: Secondary | ICD-10-CM

## 2014-09-01 DIAGNOSIS — I2510 Atherosclerotic heart disease of native coronary artery without angina pectoris: Secondary | ICD-10-CM | POA: Diagnosis not present

## 2014-09-01 DIAGNOSIS — K219 Gastro-esophageal reflux disease without esophagitis: Secondary | ICD-10-CM | POA: Diagnosis not present

## 2014-09-01 DIAGNOSIS — E669 Obesity, unspecified: Secondary | ICD-10-CM | POA: Diagnosis not present

## 2014-09-01 DIAGNOSIS — E119 Type 2 diabetes mellitus without complications: Secondary | ICD-10-CM | POA: Diagnosis not present

## 2014-09-01 MED ORDER — EVOLOCUMAB 140 MG/ML ~~LOC~~ SOAJ: 140.0000 mg | SUBCUTANEOUS | Status: DC

## 2014-09-01 MED ORDER — FLUCONAZOLE 150 MG PO TABS: 150.0000 mg | ORAL_TABLET | Freq: Once | ORAL | Status: DC

## 2014-09-01 NOTE — Progress Notes (Signed)
   Subjective:    Patient ID: Marvin Frost, male    DOB: March 17, 1946, 69 y.o.   MRN: [id number]  HPI    Review of Systems  Constitutional: Positive for activity change.  Endocrine: Negative.   Musculoskeletal: Positive for myalgias.  Skin: Positive for rash.  Psychiatric/Behavioral: Positive for sleep disturbance. The patient is nervous/anxious.        Objective:   Physical Exam  Constitutional: He is oriented to person, place, and time. He appears well-developed and well-nourished.  Neurological: He is alert and oriented to person, place, and time.  Skin: Skin is warm and dry.  Psychiatric: He has a normal mood and affect.          Assessment & Plan:   Lipid Clinic Consultation  Chief Complaint:  No chief complaint on file.    Exam General Appearance:  alert, oriented, no acute distress and obese Mood/Affect:  normal  HPI:  Long standing statin intolerance with dyslidpidemia and ASCVD and type 2 DM     Component Value Date/Time   CHOL 307* 08/18/2014 0804   CHOL 157 12/31/2012 0857   TRIG 539* 08/18/2014 0804   TRIG 184* 12/31/2012 0857   HDL 42 08/18/2014 0804   HDL 45 12/31/2012 0857    Assessment: CHD/CHF Risk Equivalents:  ASCUD and DM [place] Estd 38yrs risk:    NCEP Risk Factors Present:  HTN, family history, low HDL and age Primary Problem(s):  LDL or LDL-P elevated, HDL or HDL-P decreased and TG elevated  Recurrent yeast infection, patient gets one every 6 months or so and it always responds to one dose of diflucan, patient asked to have one on file at the pharmacy.  Current NCEP Goals: LDL Goal < 70 HDL Goal >/= 40 Tg Goal < 150 Non-HDL Goal < 100  Secondary cause of hyperlipidemia present:  Type 2 DM and statin intolerance Low fat diet followed?  Yes - diet has improved Low carb diet followed?  Yes - diabetic diet followed Exercise?  No - in PT due to statin induced myalgias  Recommendations: Changes in lipid medication(s):  Add Repatha  140mg  subcutaneously every 2 weeks Recheck Lipid Panel:  6 weeks Other labs needed:  TSH and testosterone  Time spent counseling patient:  45 minutes  Physician time spent with patient:    Referring Provider:  Laurance Flatten   PharmD:  Memory Argue, [place], CPP

## 2014-09-01 NOTE — Therapy (Signed)
[facility] [place], [place], [address] Phone: [phone]   Fax:  [phone]  Physical Therapy Treatment  Patient Details  Name: Marvin Frost MRN: [id number] Date of Birth: Oct 24, 1945 Referring Provider:  Chipper Herb, MD  Encounter Date: 09/01/2014      PT End of Session - 09/01/14 0857    Visit Number 5   Number of Visits 12   PT Start Time 0814   PT Stop Time 0856   PT Time Calculation (min) 42 min   Activity Tolerance Patient tolerated treatment well   Behavior During Therapy [facility] for tasks assessed/performed      Past Medical History  Diagnosis Date  . CAD (coronary artery disease)     Nonobstructive Cath 2004  . Hypertension   . Diabetes mellitus   . Hyperlipemia   . GERD (gastroesophageal reflux disease)   . Obesity   . Pancreatitis     Past Surgical History  Procedure Laterality Date  . Right shoulder surgery    . Hydrocele excision / repair    . Colonoscopy  11/2003    diverticulosis, no polyps  . Upper gastrointestinal endoscopy  08/19/2010    hiatal hernia 2cm, mild gastritis    There were no vitals taken for this visit.  Visit Diagnosis:  Hip pain, right      Subjective Assessment - 09/01/14 0820    Symptoms sore after traction   Currently in Pain? Yes   Pain Score 2    Pain Orientation Right   Pain Descriptors / Indicators Aching   Aggravating Factors  activity   Pain Relieving Factors rest                    [facility] Adult PT Treatment/Exercise - 09/01/14 0001    Modalities   Modalities Ultrasound   Ultrasound   Ultrasound Location rt hip   Ultrasound Parameters 1.5w/cm2/50%/3.76mhz x 55min   Ultrasound Goals Pain   Iontophoresis   Type of Iontophoresis Dexamethasone  4 of 6   Location Rt hip   Dose 1.0ML   Time 8 min   Manual Therapy   Manual Therapy Myofascial release   Myofascial Release STW/myofascial release to hip/IT band                      PT Long Term Goals - 08/25/14 0914    PT LONG TERM GOAL #1   Title demonstrate and/or verbalize techniques to reduce the risk of re-injury to include info on: physical activity   Time 8   Period Weeks   Status Achieved   PT LONG TERM GOAL #2   Title be independent with advanced HEP   Time 8   Period Weeks   Status On-going   PT LONG TERM GOAL #3   Title perform ADL's with pain not > 3/10   Time 8   Period Weeks   Status On-going   PT LONG TERM GOAL #4   Title eliminate right LE symptoms   Time 8   Period Weeks   Status On-going               Plan - 09/01/14 0859    Clinical Impression Statement pt tolerated tx well, no traction per pt due to soreness, feeling less pain in rt hip and no more tingling, goals ongoing.   PT Treatment/Interventions ADLs/Self Care Home Management;Moist Heat;Therapeutic activities;Patient/family education;Traction;Therapeutic exercise;Ultrasound;Cryotherapy;Electrical Stimulation;Manual techniques   PT Next Visit Plan cont  with POC   Consulted and Agree with Plan of Care Patient        Problem List Patient Active Problem List   Diagnosis Date Noted  . Nonspecific (abnormal) findings on radiological and other examination of gastrointestinal tract 09/27/2010  . NAUSEA AND VOMITING 08/16/2010  . DM 03/18/2009  . HYPERLIPIDEMIA 03/18/2009  . OBESITY 03/18/2009  . Essential hypertension 03/18/2009  . Coronary atherosclerosis 03/18/2009  . [facility] 03/18/2009  . GERD 03/18/2009    Ladean Raya P,PTA 09/01/2014, 9:04 AM  [facility] [address] [place], [place], [address] Phone: [phone]   Fax:  [phone]

## 2014-09-03 ENCOUNTER — Other Ambulatory Visit (INDEPENDENT_AMBULATORY_CARE_PROVIDER_SITE_OTHER): Payer: Medicare Other

## 2014-09-03 DIAGNOSIS — L659 Nonscarring hair loss, unspecified: Secondary | ICD-10-CM

## 2014-09-03 DIAGNOSIS — N529 Male erectile dysfunction, unspecified: Secondary | ICD-10-CM | POA: Diagnosis not present

## 2014-09-03 DIAGNOSIS — N521 Erectile dysfunction due to diseases classified elsewhere: Secondary | ICD-10-CM | POA: Diagnosis not present

## 2014-09-03 NOTE — Progress Notes (Signed)
Lab only 

## 2014-09-05 LAB — THYROID PANEL WITH TSH
Free Thyroxine Index: 2.5 (ref 1.2–4.9)
T3 UPTAKE RATIO: 32 % (ref 24–39)
T4, Total: 7.9 ug/dL (ref 4.5–12.0)
TSH: 2.29 u[IU]/mL (ref 0.450–4.500)

## 2014-09-05 LAB — TESTOSTERONE,FREE AND TOTAL
TESTOSTERONE FREE: 12.7 pg/mL (ref 6.6–18.1)
TESTOSTERONE: 251 ng/dL — AB (ref 348–1197)

## 2014-09-08 ENCOUNTER — Other Ambulatory Visit: Payer: Self-pay | Admitting: Pharmacist Clinician (PhC)/ Clinical Pharmacy Specialist

## 2014-09-08 ENCOUNTER — Encounter: Payer: Self-pay | Admitting: Physical Therapy

## 2014-09-08 ENCOUNTER — Ambulatory Visit: Payer: Medicare Other | Admitting: Physical Therapy

## 2014-09-08 DIAGNOSIS — E119 Type 2 diabetes mellitus without complications: Secondary | ICD-10-CM | POA: Diagnosis not present

## 2014-09-08 DIAGNOSIS — I2510 Atherosclerotic heart disease of native coronary artery without angina pectoris: Secondary | ICD-10-CM | POA: Diagnosis not present

## 2014-09-08 DIAGNOSIS — I10 Essential (primary) hypertension: Secondary | ICD-10-CM | POA: Diagnosis not present

## 2014-09-08 DIAGNOSIS — M25551 Pain in right hip: Secondary | ICD-10-CM | POA: Diagnosis not present

## 2014-09-08 DIAGNOSIS — E291 Testicular hypofunction: Secondary | ICD-10-CM

## 2014-09-08 DIAGNOSIS — E669 Obesity, unspecified: Secondary | ICD-10-CM | POA: Diagnosis not present

## 2014-09-08 DIAGNOSIS — K219 Gastro-esophageal reflux disease without esophagitis: Secondary | ICD-10-CM | POA: Diagnosis not present

## 2014-09-08 NOTE — Therapy (Signed)
[facility] [place], [place], [address] Phone: [phone]   Fax:  [phone]  Physical Therapy Treatment  Patient Details  Name: Marvin Frost MRN: [id number] Date of Birth: 06-21-1946 Referring Provider:  Chipper Herb, MD  Encounter Date: 09/08/2014      PT End of Session - 09/08/14 0848    Visit Number 6   Number of Visits 12   PT Start Time 0818   PT Stop Time 0859   PT Time Calculation (min) 41 min   Activity Tolerance Patient tolerated treatment well   Behavior During Therapy [facility] for tasks assessed/performed      Past Medical History  Diagnosis Date  . CAD (coronary artery disease)     Nonobstructive Cath 2004  . Hypertension   . Diabetes mellitus   . Hyperlipemia   . GERD (gastroesophageal reflux disease)   . Obesity   . Pancreatitis     Past Surgical History  Procedure Laterality Date  . Right shoulder surgery    . Hydrocele excision / repair    . Colonoscopy  11/2003    diverticulosis, no polyps  . Upper gastrointestinal endoscopy  08/19/2010    hiatal hernia 2cm, mild gastritis    There were no vitals filed for this visit.  Visit Diagnosis:  Hip pain, right      Subjective Assessment - 09/08/14 0820    Symptoms feeling better today   Currently in Pain? Yes   Pain Score 1    Pain Location Hip   Pain Orientation Right   Pain Descriptors / Indicators Aching   Aggravating Factors  activity   Pain Relieving Factors rest                       [facility] Adult PT Treatment/Exercise - 09/08/14 0001    Modalities   Modalities Ultrasound   Ultrasound   Ultrasound Location right hip   Ultrasound Parameters 1.5w/cm2/50%/3.80mz x 8 min   Ultrasound Goals Pain   Iontophoresis   Type of Iontophoresis Dexamethasone   Dose 1.0ML   Time 8 min   Manual Therapy   Manual Therapy Myofascial release   Myofascial Release STW/myofascial release to hip/IT band                      PT Long Term Goals - 09/08/14 0849    PT LONG TERM GOAL #1   Title demonstrate and/or verbalize techniques to reduce the risk of re-injury to include info on: physical activity   Time 8   Period Weeks   Status Achieved   PT LONG TERM GOAL #2   Title be independent with advanced HEP   Time 8   Period Weeks   Status On-going   PT LONG TERM GOAL #3   Title perform ADL's with pain not > 3/10   Time 8   Period Weeks   Status Achieved   PT LONG TERM GOAL #4   Title eliminate right LE symptoms   Time 8   Period Weeks   Status On-going               Plan - 09/08/14 0850    Clinical Impression Statement pt tolerated tx very well toady and has decreased pain overall. pt is able to perform ADL's with little pain in Right hip. Met pain goal today.   PT Treatment/Interventions ADLs/Self Care Home Management;Moist Heat;Therapeutic activities;Patient/family education;Traction;Therapeutic exercise;Ultrasound;Cryotherapy;Electrical Stimulation;Manual techniques   PT  Next Visit Plan cont with POC   Consulted and Agree with Plan of Care Patient        Problem List Patient Active Problem List   Diagnosis Date Noted  . Nonspecific (abnormal) findings on radiological and other examination of gastrointestinal tract 09/27/2010  . NAUSEA AND VOMITING 08/16/2010  . DM 03/18/2009  . HYPERLIPIDEMIA 03/18/2009  . OBESITY 03/18/2009  . Essential hypertension 03/18/2009  . Coronary atherosclerosis 03/18/2009  . [facility] 03/18/2009  . GERD 03/18/2009    Phillips Climes, PTA 09/08/2014, 8:59 AM  [facility] [address] [place], [place], [address] Phone: [phone]   Fax:  [phone]

## 2014-09-10 ENCOUNTER — Encounter: Payer: Self-pay | Admitting: Physical Therapy

## 2014-09-10 ENCOUNTER — Ambulatory Visit: Payer: Medicare Other | Admitting: Physical Therapy

## 2014-09-10 DIAGNOSIS — M25551 Pain in right hip: Secondary | ICD-10-CM | POA: Diagnosis not present

## 2014-09-10 DIAGNOSIS — K219 Gastro-esophageal reflux disease without esophagitis: Secondary | ICD-10-CM | POA: Diagnosis not present

## 2014-09-10 DIAGNOSIS — E119 Type 2 diabetes mellitus without complications: Secondary | ICD-10-CM | POA: Diagnosis not present

## 2014-09-10 DIAGNOSIS — I2510 Atherosclerotic heart disease of native coronary artery without angina pectoris: Secondary | ICD-10-CM | POA: Diagnosis not present

## 2014-09-10 DIAGNOSIS — E669 Obesity, unspecified: Secondary | ICD-10-CM | POA: Diagnosis not present

## 2014-09-10 DIAGNOSIS — I10 Essential (primary) hypertension: Secondary | ICD-10-CM | POA: Diagnosis not present

## 2014-09-10 NOTE — Therapy (Signed)
[facility] [place], [place], [address] Phone: [phone]   Fax:  [phone]  Physical Therapy Treatment  Patient Details  Name: Jah Alarid MRN: [id number] Date of Birth: [birth year] Referring Provider:  Chipper Herb, MD  Encounter Date: 09/10/2014      PT End of Session - 09/10/14 0849    Visit Number 7   Number of Visits 12   PT Start Time 0815   PT Stop Time 0858   PT Time Calculation (min) 43 min   Activity Tolerance Patient tolerated treatment well   Behavior During Therapy [facility] for tasks assessed/performed      Past Medical History  Diagnosis Date  . CAD (coronary artery disease)     Nonobstructive Cath 2004  . Hypertension   . Diabetes mellitus   . Hyperlipemia   . GERD (gastroesophageal reflux disease)   . Obesity   . Pancreatitis     Past Surgical History  Procedure Laterality Date  . Right shoulder surgery    . Hydrocele excision / repair    . Colonoscopy  11/2003    diverticulosis, no polyps  . Upper gastrointestinal endoscopy  08/19/2010    hiatal hernia 2cm, mild gastritis    There were no vitals filed for this visit.  Visit Diagnosis:  Hip pain, right      Subjective Assessment - 09/10/14 0817    Symptoms feeling 90-100% better overall   Currently in Pain? Yes   Pain Score 1    Pain Location Hip   Pain Orientation Right   Pain Descriptors / Indicators Aching   Aggravating Factors  activity   Pain Relieving Factors rest                       [facility] Adult PT Treatment/Exercise - 09/10/14 0001    Modalities   Modalities Ultrasound;Iontophoresis   Ultrasound   Ultrasound Location right   Ultrasound Parameters 1.5w/cm/2/50%/3.10mz x 8 min   Iontophoresis   Type of Iontophoresis Dexamethasone   Dose 1.0ML   Time 8 min  6 of 6   Manual Therapy   Manual Therapy Myofascial release   Myofascial Release STW/myofascial release to hip/IT band                 PT Education - 09/10/14 0848    Education provided Yes   Education Details HEP-advanced   Person(s) Educated Patient   Methods Explanation;Demonstration;Handout   Comprehension Verbalized understanding;Returned demonstration             PT Long Term Goals - 09/10/14 0820    PT LONG TERM GOAL #1   Title demonstrate and/or verbalize techniques to reduce the risk of re-injury to include info on: physical activity   Time 8   Period Weeks   Status Achieved   PT LONG TERM GOAL #2   Title be independent with advanced HEP   Time 8   Period Weeks   Status Achieved   PT LONG TERM GOAL #3   Title perform ADL's with pain not > 3/10   Time 8   Period Weeks   Status Achieved   PT LONG TERM GOAL #4   Title eliminate right LE symptoms   Time 8   Period Weeks   Status Achieved               Plan - 09/10/14 03300   Clinical Impression Statement pt tolerated tx well and overall feels  90-100% improvement. pt has met all current goals and would like to be put on hold for a few weeks to make sure there are no flare ups.   PT Treatment/Interventions ADLs/Self Care Home Management;Moist Heat;Therapeutic activities;Patient/family education;Traction;Therapeutic exercise;Ultrasound;Cryotherapy;Electrical Stimulation;Manual techniques   PT Next Visit Plan on hold per pt   Consulted and Agree with Plan of Care Patient        Problem List Patient Active Problem List   Diagnosis Date Noted  . Nonspecific (abnormal) findings on radiological and other examination of gastrointestinal tract 09/27/2010  . NAUSEA AND VOMITING 08/16/2010  . DM 03/18/2009  . HYPERLIPIDEMIA 03/18/2009  . OBESITY 03/18/2009  . Essential hypertension 03/18/2009  . Coronary atherosclerosis 03/18/2009  . [facility] 03/18/2009  . GERD 03/18/2009    Phillips Climes, PTA 09/10/2014, 8:57 AM  [facility] [address] [place], [place], [address] Phone: [phone]    Fax:  [phone]

## 2014-09-10 NOTE — Patient Instructions (Signed)
Strengthening: Hip Abduction (Side-Lying)   Tighten muscles on front of left thigh, then lift leg _4___ inches from surface, keeping knee locked.  Repeat __10__ times per set. Do __2__ sets per session. Do __1-2__ sessions per day.  http://orth.exer.us/622   Copyright  VHI. All rights reserved.  Piriformis (Supine)   Cross legs, right on top. Gently pull other knee toward chest until stretch is felt in buttock/hip of top leg. Hold _30___ seconds. Repeat __3__ times per set. Do _2___ sets per session. Do __2__ sessions per day.  http://orth.exer.us/676   Copyright  VHI. All rights reserved.

## 2014-09-15 ENCOUNTER — Telehealth: Payer: Self-pay | Admitting: Family Medicine

## 2014-09-17 ENCOUNTER — Other Ambulatory Visit (INDEPENDENT_AMBULATORY_CARE_PROVIDER_SITE_OTHER): Payer: Medicare Other

## 2014-09-17 DIAGNOSIS — E291 Testicular hypofunction: Secondary | ICD-10-CM

## 2014-09-17 NOTE — Progress Notes (Signed)
Lab only 

## 2014-09-18 LAB — FSH/LH
FSH: 5.2 m[IU]/mL (ref 1.5–12.4)
LH: 6.2 m[IU]/mL (ref 1.7–8.6)

## 2014-09-18 LAB — TESTOSTERONE,FREE AND TOTAL
Testosterone, Free: 17.1 pg/mL (ref 6.6–18.1)
Testosterone: 269 ng/dL — ABNORMAL LOW (ref 348–1197)

## 2014-09-29 ENCOUNTER — Other Ambulatory Visit: Payer: Self-pay | Admitting: Pharmacist Clinician (PhC)/ Clinical Pharmacy Specialist

## 2014-09-29 ENCOUNTER — Other Ambulatory Visit: Payer: Self-pay | Admitting: *Deleted

## 2014-09-29 MED ORDER — TESTOSTERONE 20.25 MG/ACT (1.62%) TD GEL: 40.5000 mg | Freq: Every morning | TRANSDERMAL | Status: DC

## 2014-09-29 NOTE — Telephone Encounter (Signed)
Script for testosterone ready.

## 2014-09-29 NOTE — Progress Notes (Signed)
Patient had low serum testosterone on two separate occasions with a normal free level.  Samples were done in the morning.  Patient has complaints of decreased libido and ED.  He feels sluggish, tired and low motivation.  He would like to try a trial of testosterone replacement.  PSA normal and FSH/LH both normal.  Monitoring:  Every 3 months, 6 months, and 12 months check PSA, hematocrit, and testosterone levels.  Androgel prescription sent in to mail order pharmacy with directions.

## 2014-10-01 ENCOUNTER — Telehealth: Payer: Self-pay

## 2014-10-01 NOTE — Telephone Encounter (Signed)
[employer] approved Androgel 1.62% pump

## 2014-11-05 ENCOUNTER — Ambulatory Visit (INDEPENDENT_AMBULATORY_CARE_PROVIDER_SITE_OTHER): Payer: Medicare Other | Admitting: Pharmacist

## 2014-11-05 ENCOUNTER — Encounter: Payer: Self-pay | Admitting: Pharmacist

## 2014-11-05 VITALS — BP 138/84 | HR 81 | Ht 68.0 in | Wt 220.0 lb

## 2014-11-05 DIAGNOSIS — E785 Hyperlipidemia, unspecified: Secondary | ICD-10-CM

## 2014-11-05 DIAGNOSIS — E1169 Type 2 diabetes mellitus with other specified complication: Secondary | ICD-10-CM

## 2014-11-05 DIAGNOSIS — E119 Type 2 diabetes mellitus without complications: Secondary | ICD-10-CM

## 2014-11-05 DIAGNOSIS — Z0000 Encounter for general adult medical examination without abnormal findings: Secondary | ICD-10-CM

## 2014-11-05 DIAGNOSIS — R7989 Other specified abnormal findings of blood chemistry: Secondary | ICD-10-CM

## 2014-11-05 MED ORDER — INSULIN PEN NEEDLE 32G X 5 MM MISC: Status: DC

## 2014-11-05 NOTE — Progress Notes (Signed)
Patient ID: Marvin Frost, male   DOB: April 21, 1946, 69 y.o.   MRN: [id number]    Subjective:   Marvin Frost is a 69 y.o. male who presents for an Initial Medicare Annual Wellness Visit and follow up diabetes and hyperlipidemia.  Since I last saw patient he has started Repatha Q2 weeks for hyperlipidemia.  He is tolerating well.  He is no longer taking Crestor 60m daily because he was having muscle pain on daily Creator but was able to tolerate 3 times per week dosing in past.  He is still taking Zetia 120mdaily and Lovaza 100059maily.  Patient is also c/o of decreases sleep related to bilateral hip pain.  He has appt with Ortho/ Dr OliAlvan Damer evaluation next week.  He has tried PT which help with hip pain in past but this time it has not relieved pain. Patient only takes Aleve as needed for hip pain.   Patient's exercise has dramatically decreased due to hip pain and also is BG was increase slightly.  HBG readings range from 140 to 206.  Patient's last A1c was 8.8% 08/18/2014. He is taking Synjardy BID, Levemir 48 units qam and 50 units qpm.  Novolog 10 units 2 to 3 times daily before meals.   Patient also started Androgel about 2 months ago.  When he visit cardiologist his HR was elevated and he though could be related to either Androgel or coenzyme Q10.  He stopped both and feels that HR has improved.  He is due to follow up with Dr HocJenkins Rouge31/16.  Current Medications (verified) Outpatient Encounter Prescriptions as of 11/05/2014  Medication Sig  . aspirin 81 MG tablet Take 81 mg by mouth daily.    . Blood Glucose Monitoring Suppl (FREESTYLE LITE) DEVI Use to check BS bid. DX. E11.9  . Cholecalciferol (VITAMIN D3) 5000 UNITS TABS Take 1 tablet by mouth daily.  . Empagliflozin-Metformin HCl (SYNJARDY) 12.10-998 MG TABS Take 1 tablet by mouth 2 (two) times daily.  . Evolocumab (REPATHA SURECLICK) 140601/ML SOAJ Inject 140 mg into the skin as directed. Inject subcutaneously every two weeks  .  glucose blood (FREESTYLE LITE) test strip Check BS BID and PRN. DX. E11.9  . insulin aspart (NOVOLOG FLEXPEN) 100 UNIT/ML FlexPen Inject 10 Units into the skin 4 (four) times daily -  with meals and at bedtime.  . insulin detemir (LEVEMIR) 100 UNIT/ML injection INJECT 48 UNITS EACH MORNING AND 50 UNITS EACH EVENING  . Insulin Pen Needle (NOVOTWIST) 32G X 5 MM MISC Use with insulin pens up to 5 times per day.dx.250.0  . Lancets (FREESTYLE) lancets Check BS BID and PRN. DX.E11.9  . Multiple Vitamin (MULTIVITAMIN) capsule Take 1 capsule by mouth daily.    . oMarland Kitchenmesartan-hydrochlorothiazide (BENICAR HCT) 40-25 MG per tablet Take 1 tablet by mouth daily.  . oMarland [place]-3 acid ethyl esters (LOVAZA) 1 G capsule Take 1 g by mouth daily.  . pantoprazole (PROTONIX) 40 MG tablet Take 1 tablet (40 mg total) by mouth 2 (two) times daily.  . [DISCONTINUED] Insulin Pen Needle (NOVOTWIST) 32G X 5 MM MISC Use with insulin pens up to 5 times per day.dx.250.0  . [DISCONTINUED] ZETIA 10 MG tablet Take 1 tablet (10 mg total) by mouth daily.  . fluconazole (DIFLUCAN) 150 MG tablet Take 1 tablet (150 mg total) by mouth once. (Patient not taking: Reported on 11/05/2014)  . Testosterone (ANDROGEL PUMP) 20.25 MG/ACT (1.62%) GEL Place 40.5 mg onto the skin every morning. Two pumps is equal to  40.53m.  Apply to clean, dry, intact skin on upper arms or shoulders. Apply one pump application to right arm/shoulder and one pump application to left arm/shoulder.   Allow gel to dry before putting on clothes.  Wash hands after applying gel. (Patient not taking: Reported on 11/05/2014)  . [DISCONTINUED] Canagliflozin-Metformin HCl [phone] MG TABS Take 1 tablet by mouth 2 (two) times daily. (Patient not taking: Reported on 11/05/2014)  . [DISCONTINUED] Cholecalciferol (VITAMIN D) 2000 UNITS CAPS Take 1 capsule by mouth daily.   No facility-administered encounter medications on file as of 11/05/2014.    Allergies (verified) Baxinets; Celebrex;  Clarithromycin; Liraglutide; Statins; Toprol xl; Triplex ad; Welchol; and Norvasc   History: Past Medical History  Diagnosis Date  . CAD (coronary artery disease)     Nonobstructive Cath 2004  . Hypertension   . Diabetes mellitus   . Hyperlipemia   . GERD (gastroesophageal reflux disease)   . Obesity   . Pancreatitis   . Cataract    Past Surgical History  Procedure Laterality Date  . Right shoulder surgery    . Hydrocele excision / repair    . Colonoscopy  11/2003    diverticulosis, no polyps  . Upper gastrointestinal endoscopy  08/19/2010    hiatal hernia 2cm, mild gastritis   Family History  Problem Relation Age of Onset  . Heart disease Mother   . Diabetes Brother   . Heart disease Brother   . Heart attack Brother 624 . Heart attack Father 674  Social History   Occupational History  . 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 Smoking status: Former Smoker    Types: Cigarettes    Quit date: 02/05/1984  . Smokeless tobacco: Never Used  . Alcohol Use: No  . Drug Use: No  . Sexual Activity: Yes   Do you feel safe at home?  Yes  Dietary issues and exercise activities discussed: Current Exercise Habits:: Exercise is limited by, Limited by:: orthopedic condition(s) (hip pain - seeing ortho in 1 week)  Current Dietary habits:  Tries to limit bread, potatoes and sweets.    Cardiac Risk Factors include: advanced age (>574m, >6>75omen);diabetes mellitus;dyslipidemia;family history of premature cardiovascular disease;hypertension;male gender;sedentary lifestyle  Objective:    Today's Vitals   11/05/14 0822  BP: 138/84  Pulse: 81  Height: _0  (1.727 m)  Weight: 220 lb (99.791 kg)  PainSc: 2   PainLoc: Hip   Body mass index is 33.46 kg/(m^2).   Activities of Daily Living In your present state of health, do you have any difficulty performing the following activities: 11/05/2014  Hearing? N  Vision? N  Difficulty concentrating or  making decisions? N  Walking or climbing stairs? Y  Dressing or bathing? N  Doing errands, shopping? N  Preparing Food and eating ? N  Using the Toilet? N  In the past six months, have you accidently leaked urine? N  Do you have problems with loss of bowel control? N  Managing your Medications? N  Managing your Finances? N  Housekeeping or managing your Housekeeping? N    Are there smokers in your home (other than you)? No    Depression Screen PHQ 2/9 Scores 11/05/2014 07/23/2014 12/12/2013  PHQ - 2 Score 2 0 0  PHQ- 9 Score 7 - -    Fall Risk Fall Risk  11/05/2014 07/23/2014 12/12/2013  Falls in the past year? No No No    Cognitive Function:  MMSE - Mini Mental State Exam 11/05/2014  Orientation to time 5  Orientation to Place 5  Registration 3  Attention/ Calculation 5  Recall 3  Language- name 2 objects 2  Language- repeat 1  Language- follow 3 step command 3  Language- read & follow direction 1  Write a sentence 1  Copy design 1  Total score 30    Immunizations and Health Maintenance Immunization History  Administered Date(s) Administered  . Influenza Whole 02/24/2010  . Influenza,inj,Quad PF,36+ Mos 03/26/2013, 03/30/2014  . Pneumococcal Conjugate-13 06/17/2013  . Pneumococcal Polysaccharide-23 02/24/2009  . Td 02/24/2006  . Zoster 07/27/2006   There are no preventive care reminders to display for this patient.  Patient Care Team: Chipper Herb, MD as PCP - General (Family Medicine)  Indicate any recent Medical Services you may have received from other than [facility] providers in the past year (date may be approximate).    Assessment:    Annual Wellness Visit  Bilateral Hip Pain with effects on sleep and daily activities Type 2 diabetes - uncontrolled hyperlipidemia  Screening Tests Health Maintenance  Topic Date Due  . URINE MICROALBUMIN  01/20/2015  . INFLUENZA VACCINE  01/25/2015  . HEMOGLOBIN A1C  02/16/2015  . OPHTHALMOLOGY EXAM  03/27/2015  .  FOOT EXAM  07/24/2015  . TETANUS/TDAP  02/25/2016  . ZOSTAVAX  Completed  . PNA vac Low Risk Adult  Completed        Plan:   During the course of the visit Aldyn was educated and counseled about the following appropriate screening and preventive services:   Vaccines to include Pneumoccal, Influenza,  Td, Zostavax - UTD on all these vaccines  Colorectal cancer screening - last colonoscopy was 2015; next FOBT due at end of 2016  Cardiovascular disease screening - UTD;  Will get lipids checked in 2 weeks and follow up with cardiologist.  BP at goal today.  Continue Repatha.  Recommend discontinue Zetia.  Might restart Crestor in future since patient has tolerated 3 time per week dosing in past.   Low testosterone - patient feels that he did not see much difference in mood or libido when taking testosterone and might have increased HR.  He is going to hold off on taking for now.  Glaucoma screening / Diabetic Eye Exam - UTD - requesting copy of visit from Dr Zigmund Daniel  Nutrition counseling - discussed limiting caloric intake and CHO  Prostate cancer screening - UTD  Advanced Directives - information given to patient today  Will discuss with patient's PCP about options for pain control until he sees ortho next week.  Patient to RTC in 2 weeks to have labs below drawn Orders Placed This Encounter  Procedures  . CMP14+EGFR    Standing Status: Future     Number of Occurrences:      Standing Expiration Date: 11/24/2014  . NMR, lipoprofile    Standing Status: Future     Number of Occurrences:      Standing Expiration Date: 11/24/2014  . Vit D  25 hydroxy (rtn osteoporosis monitoring)    Standing Status: Future     Number of Occurrences:      Standing Expiration Date: 11/24/2014  . POCT glycosylated hemoglobin (Hb A1C)    Standing Status: Future     Number of Occurrences:      Standing Expiration Date: 11/24/2014     Patient Instructions (the written plan) were given to the  patient.   Cherre Robins, PHARMD  11/05/2014

## 2014-11-05 NOTE — Patient Instructions (Signed)
  Mr. Fluckiger , Thank you for taking time to come for your Medicare Wellness Visit. I appreciate your ongoing commitment to your health goals. Please review the following plan we discussed and let me know if I can assist you in the future.    This is a list of the screening recommended for you and due dates:  Health Maintenance  Topic Date Due  . Urine Protein Check  01/20/2015  . Flu Shot  01/25/2015  . Hemoglobin A1C  02/16/2015  . Eye exam for diabetics  03/27/2015  . Complete foot exam   07/24/2015  . Tetanus Vaccine  02/25/2016  . Shingles Vaccine  Completed  . Pneumonia vaccines  Completed

## 2014-11-06 ENCOUNTER — Other Ambulatory Visit: Payer: Self-pay | Admitting: Pharmacist

## 2014-11-06 MED ORDER — TRAMADOL HCL 50 MG PO TABS: 25.0000 mg | ORAL_TABLET | Freq: Two times a day (BID) | ORAL | Status: DC | PRN

## 2014-11-10 DIAGNOSIS — M549 Dorsalgia, unspecified: Secondary | ICD-10-CM | POA: Diagnosis not present

## 2014-11-10 DIAGNOSIS — M25551 Pain in right hip: Secondary | ICD-10-CM | POA: Diagnosis not present

## 2014-11-16 ENCOUNTER — Other Ambulatory Visit (INDEPENDENT_AMBULATORY_CARE_PROVIDER_SITE_OTHER): Payer: Medicare Other

## 2014-11-16 DIAGNOSIS — E785 Hyperlipidemia, unspecified: Secondary | ICD-10-CM

## 2014-11-16 DIAGNOSIS — E119 Type 2 diabetes mellitus without complications: Secondary | ICD-10-CM | POA: Diagnosis not present

## 2014-11-16 DIAGNOSIS — R7989 Other specified abnormal findings of blood chemistry: Secondary | ICD-10-CM | POA: Diagnosis not present

## 2014-11-16 DIAGNOSIS — E1169 Type 2 diabetes mellitus with other specified complication: Secondary | ICD-10-CM | POA: Diagnosis not present

## 2014-11-16 LAB — POCT GLYCOSYLATED HEMOGLOBIN (HGB A1C): Hemoglobin A1C: 8.6

## 2014-11-17 LAB — CMP14+EGFR
ALBUMIN: 4.1 g/dL (ref 3.5–4.8)
ALK PHOS: 77 IU/L (ref 39–117)
ALT: 18 IU/L (ref 0–44)
AST: 12 IU/L (ref 0–40)
Albumin/Globulin Ratio: 1.5 (ref 1.1–2.5)
BUN / CREAT RATIO: 16 (ref 10–22)
BUN: 16 mg/dL (ref 8–27)
Bilirubin Total: 0.2 mg/dL (ref 0.0–1.2)
CO2: 20 mmol/L (ref 18–29)
CREATININE: 0.97 mg/dL (ref 0.76–1.27)
Calcium: 9.4 mg/dL (ref 8.6–10.2)
Chloride: 96 mmol/L — ABNORMAL LOW (ref 97–108)
GFR calc Af Amer: 91 mL/min/{1.73_m2} (ref >59)
GFR calc non Af Amer: 79 mL/min/{1.73_m2} (ref >59)
GLUCOSE: 241 mg/dL — AB (ref 65–99)
Globulin, Total: 2.7 g/dL (ref 1.5–4.5)
Potassium: 4.3 mmol/L (ref 3.5–5.2)
SODIUM: 139 mmol/L (ref 134–144)
Total Protein: 6.8 g/dL (ref 6.0–8.5)

## 2014-11-17 LAB — NMR, LIPOPROFILE
CHOLESTEROL: 204 mg/dL — AB (ref 100–199)
HDL CHOLESTEROL BY NMR: 38 mg/dL — AB (ref >39)
HDL Particle Number: 36 umol/L (ref >=30.5)
LDL PARTICLE NUMBER: 1422 nmol/L — AB (ref <1000)
LDL SIZE: 19.5 nm (ref >20.5)
LP-IR Score: 77 — ABNORMAL HIGH (ref <=45)
SMALL LDL PARTICLE NUMBER: 1225 nmol/L — AB (ref <=527)
Triglycerides by NMR: 828 mg/dL (ref 0–149)

## 2014-11-17 LAB — VITAMIN D 25 HYDROXY (VIT D DEFICIENCY, FRACTURES): Vit D, 25-Hydroxy: 27.9 ng/mL — ABNORMAL LOW (ref 30.0–100.0)

## 2014-11-18 ENCOUNTER — Encounter: Payer: Self-pay | Admitting: Family Medicine

## 2014-11-18 DIAGNOSIS — M545 Low back pain: Secondary | ICD-10-CM | POA: Diagnosis not present

## 2014-11-19 ENCOUNTER — Encounter: Payer: Self-pay | Admitting: Pharmacist

## 2014-11-19 ENCOUNTER — Telehealth: Payer: Self-pay | Admitting: Pharmacist

## 2014-11-19 ENCOUNTER — Encounter: Payer: Self-pay | Admitting: *Deleted

## 2014-11-19 NOTE — Telephone Encounter (Signed)
A1c still elevated above goal - but a little better.  BG elevated. Triglycerides and Total cholesterol elevated - added direct LDL. Patient has taken PSK9 for last 4 weeks but he had stopped statin. Recommended at visit 1 week ago to restart statin crestor 10mg  twice a week.   Continue Repatha. This and better BG control should help with Tg and total cholesterol. Vitamin D improved but still low - recommend increase OTC vitamin D 5000 IU dose to 2 capsules on weekends and 1 capsule all other days.

## 2014-11-20 LAB — LDL CHOLESTEROL, DIRECT: LDL DIRECT: 75 mg/dL (ref 0–99)

## 2014-11-20 LAB — SPECIMEN STATUS REPORT

## 2014-11-24 ENCOUNTER — Encounter: Payer: Self-pay | Admitting: Cardiology

## 2014-11-24 ENCOUNTER — Ambulatory Visit (INDEPENDENT_AMBULATORY_CARE_PROVIDER_SITE_OTHER): Payer: Medicare Other | Admitting: Cardiology

## 2014-11-24 VITALS — BP 112/70 | HR 96 | Ht 68.0 in | Wt 216.0 lb

## 2014-11-24 DIAGNOSIS — I2510 Atherosclerotic heart disease of native coronary artery without angina pectoris: Secondary | ICD-10-CM

## 2014-11-24 DIAGNOSIS — I471 Supraventricular tachycardia: Secondary | ICD-10-CM | POA: Diagnosis not present

## 2014-11-24 NOTE — Patient Instructions (Signed)
Your physician recommends that you schedule a follow-up appointment in: one year with Dr. Hochrein  

## 2014-11-24 NOTE — Progress Notes (Signed)
HPI The patient presents for follow up of nonobstructive CAD.  His last cath was 2004. His last stress test 2013.  I saw him earlier this year and he was doing okay although I noted that his heart rate was a little bit high.  He is going to have possible back and hip surgery. He wanted to know if his increased heart rate might be relevant prior to that. He was having some increased shortness of breath as allergens cleared up he says this has improved. He does have a more rapid heart rate and he did previously. However, he denies any chest pressure, neck or arm discomfort. He's not had any presyncope or syncope. He's not describing PND or orthopnea. He is in severe discomfort in his back and hip pain.   Allergies  Allergen Reactions  . Baxinets [Cetylpyridinium-Benzocaine]   . Celebrex [Celecoxib] Nausea Only  . Clarithromycin   . Liraglutide Other (See Comments)    Abdominal pain and enlarged pancreas on CT ? pancreatitis  . Statins   . Toprol Xl [Metoprolol Succinate]   . Triplex Ad Other (See Comments)    Abdominal pain and enlarged pancreas on CT? pancreatitis  . Welchol [Colesevelam Hcl] Other (See Comments)    constipation  . Norvasc [Amlodipine Besylate] Rash    Current Outpatient Prescriptions  Medication Sig Dispense Refill  . aspirin 81 MG tablet Take 81 mg by mouth daily.      . Blood Glucose Monitoring Suppl (FREESTYLE LITE) DEVI Use to check BS bid. DX. E11.9 1 each 0  . Cholecalciferol (VITAMIN D3) 5000 UNITS TABS Take 1 tablet by mouth daily. And 2 tablets on weekends    . Empagliflozin-Metformin HCl (SYNJARDY) 12.10-998 MG TABS Take 1 tablet by mouth 2 (two) times daily. 180 tablet 1  . Evolocumab (REPATHA SURECLICK) 482 MG/ML SOAJ Inject 140 mg into the skin as directed. Inject subcutaneously every two weeks 6 pen 3  . fluconazole (DIFLUCAN) 150 MG tablet Take 1 tablet (150 mg total) by mouth once. 1 tablet 0  . glucose blood (FREESTYLE LITE) test strip Check BS BID  and PRN. DX. E11.9 300 each 3  . insulin aspart (NOVOLOG FLEXPEN) 100 UNIT/ML FlexPen Inject 10 Units into the skin 4 (four) times daily -  with meals and at bedtime. 30 mL 3  . insulin detemir (LEVEMIR) 100 UNIT/ML injection INJECT 50 UNITS EACH MORNING AND 52 UNITS EACH EVENING 90 mL 3  . Insulin Pen Needle (NOVOTWIST) 32G X 5 MM MISC Use with insulin pens up to 5 times per day.dx.250.0 300 each 3  . Lancets (FREESTYLE) lancets Check BS BID and PRN. DX.E11.9 300 each 3  . Multiple Vitamin (MULTIVITAMIN) capsule Take 1 capsule by mouth daily.      Marland Kitchen olmesartan-hydrochlorothiazide (BENICAR HCT) 40-25 MG per tablet Take 1 tablet by mouth daily. 90 tablet 3  . pantoprazole (PROTONIX) 40 MG tablet Take 1 tablet (40 mg total) by mouth 2 (two) times daily. 180 tablet 3  . rosuvastatin (CRESTOR) 10 MG tablet Take 1 tablet twice weekly 25 tablet 0  . traMADol (ULTRAM) 50 MG tablet Take 0.5-1 tablets (25-50 mg total) by mouth every 12 (twelve) hours as needed. 30 tablet 0   No current facility-administered medications for this visit.    Past Medical History  Diagnosis Date  . CAD (coronary artery disease)     Nonobstructive Cath 2004  . Hypertension   . Diabetes mellitus   . Hyperlipemia   . GERD (  gastroesophageal reflux disease)   . Obesity   . Pancreatitis   . Cataract     Past Surgical History  Procedure Laterality Date  . Right shoulder surgery    . Hydrocele excision / repair    . Colonoscopy  11/2003    diverticulosis, no polyps  . Upper gastrointestinal endoscopy  08/19/2010    hiatal hernia 2cm, mild gastritis    ROS:  Feet cramping.  Otherwise as stated in the HPI and negative for all other systems.  PHYSICAL EXAM BP 112/70 mmHg  Pulse 96  Ht 5\' 8"  (1.727 m)  Wt 216 lb (97.977 kg)  BMI 32.85 kg/m2 GENERAL:  Well appearing NECK:  No jugular venous distention, waveform within normal limits, carotid upstroke brisk and symmetric, no bruits, no thyromegaly LUNGS:  Clear to  auscultation bilaterally CHEST:  Unremarkable HEART:  PMI not displaced or sustained,S1 and S2 within normal limits, no S3, no S4, no clicks, no rubs, no murmurs ABD:  Flat, positive bowel sounds normal in frequency in pitch, no bruits, no rebound, no guarding, no midline pulsatile mass, no hepatomegaly, no splenomegaly, obese EXT:  2 plus pulses throughout, no edema, no cyanosis no clubbing  EKG: Normal sinus rhythm, rate 96, left axis views poor anterior R-wave progression, T-wave changes.  11/24/2014  ASSESSMENT AND PLAN  CAD -  The patient has no new sypmtoms.  No further cardiovascular testing is indicated.  We will continue with aggressive risk reduction and meds as listed.  No further testing should he required if he has surgery in the next 6 months.  He has no high-risk findings or symptoms. He does his activities of daily living. Therefore, based on ACC/AHA guidelines, the patient would be at acceptable risk for the planned procedure without further cardiovascular testing.  HYPERLIPIDEMIA -  This is now being treated with Repatha and followed by Dr. Laurance Flatten.  HYPERTENSION -  The blood pressure is at target. No change in medications is indicated. We will continue with therapeutic lifestyle changes (TLC).  DM -  His A1C was 8.6  We discussed this. This is probably related to his lack of exercise.

## 2014-11-25 ENCOUNTER — Telehealth: Payer: Self-pay | Admitting: Pharmacist

## 2014-11-25 MED ORDER — TRAMADOL HCL 50 MG PO TABS: 25.0000 mg | ORAL_TABLET | Freq: Two times a day (BID) | ORAL | Status: DC | PRN

## 2014-11-25 NOTE — Telephone Encounter (Signed)
Rx called in and patient notified.  

## 2014-11-26 ENCOUNTER — Encounter: Payer: Self-pay | Admitting: Family Medicine

## 2014-11-26 LAB — HM DIABETES EYE EXAM

## 2014-12-21 ENCOUNTER — Ambulatory Visit (INDEPENDENT_AMBULATORY_CARE_PROVIDER_SITE_OTHER): Payer: Medicare Other | Admitting: Pharmacist

## 2014-12-21 ENCOUNTER — Encounter: Payer: Self-pay | Admitting: Pharmacist

## 2014-12-21 VITALS — BP 120/68 | HR 76 | Ht 68.0 in | Wt 217.0 lb

## 2014-12-21 DIAGNOSIS — E1159 Type 2 diabetes mellitus with other circulatory complications: Secondary | ICD-10-CM | POA: Diagnosis not present

## 2014-12-21 DIAGNOSIS — E785 Hyperlipidemia, unspecified: Secondary | ICD-10-CM

## 2014-12-21 DIAGNOSIS — E1169 Type 2 diabetes mellitus with other specified complication: Secondary | ICD-10-CM | POA: Diagnosis not present

## 2014-12-21 DIAGNOSIS — I2510 Atherosclerotic heart disease of native coronary artery without angina pectoris: Secondary | ICD-10-CM

## 2014-12-21 MED ORDER — INSULIN DEGLUDEC 200 UNIT/ML ~~LOC~~ SOPN: 120.0000 [IU] | PEN_INJECTOR | Freq: Every day | SUBCUTANEOUS | Status: DC

## 2014-12-21 MED ORDER — TRAMADOL HCL 50 MG PO TABS: 50.0000 mg | ORAL_TABLET | Freq: Two times a day (BID) | ORAL | Status: DC | PRN

## 2014-12-21 NOTE — Progress Notes (Signed)
Patient ID: Marvin Frost, male   DOB: May 23, 1946, 69 y.o.   MRN: [id number]    Subjective:   Marvin Frost is a 69 y.o. male who presents for follow up diabetes and hyperlipidemia.  Patient continues to tolerate Repatha.  He did restart Crestor 10mg  twice a week about 1 month ago.  At ou last visit we discussed his hip pain which was affecting his sleep and also diabetes control.  He had appt with Ortho/ Dr Alvan Dame for evaluation and there are plans for steroid injection in hip 01/06/2015.  Patient continues to take tramadol 50mg  1 tablet at bedtime.  He has tried PT which help with hip pain in past but this time it has not relieved pain.    Patient's exercise has dramatically decreased due to hip pain and also is BG was increase slightly.  HBG readings range from 104 to 224.  Patient's last A1c was 8.6% (11/16/2014) HBG readings:  Am - 153, 191, 244, 155, 201, 197, 158, 171, 159,   Post Prandial - 279, 196. 209, 164, 172, 142  Betdtime = 143, 161, 172, 189, 189,   He is taking Synjardy BID, Levemir 50 units qam and 52 units qpm.  Novolog 10 to 12 units 2 to 3 times daily before meals.   Current Medications (verified) Outpatient Encounter Prescriptions as of 12/21/2014  Medication Sig  . aspirin 81 MG tablet Take 81 mg by mouth daily.    . Blood Glucose Monitoring Suppl (FREESTYLE LITE) DEVI Use to check BS bid. DX. E11.9  . Cholecalciferol (VITAMIN D3) 5000 UNITS TABS Take 1 tablet by mouth daily. And 2 tablets on weekends  . Empagliflozin-Metformin HCl (SYNJARDY) 12.10-998 MG TABS Take 1 tablet by mouth 2 (two) times daily.  . Evolocumab (REPATHA SURECLICK) 829 MG/ML SOAJ Inject 140 mg into the skin as directed. Inject subcutaneously every two weeks  . fluconazole (DIFLUCAN) 150 MG tablet Take 1 tablet (150 mg total) by mouth once.  Marland Kitchen glucose blood (FREESTYLE LITE) test strip Check BS BID and PRN. DX. E11.9  . insulin aspart (NOVOLOG FLEXPEN) 100 UNIT/ML FlexPen Inject 10 Units into the skin 4  (four) times daily -  with meals and at bedtime.  . Insulin Degludec (TRESIBA FLEXTOUCH) 200 UNIT/ML SOPN Inject 120 Units into the skin daily. Inject up to 120 units once a day  . Insulin Pen Needle (NOVOTWIST) 32G X 5 MM MISC Use with insulin pens up to 5 times per day.dx.250.0  . Lancets (FREESTYLE) lancets Check BS BID and PRN. DX.E11.9  . Multiple Vitamin (MULTIVITAMIN) capsule Take 1 capsule by mouth daily.    Marland Kitchen olmesartan-hydrochlorothiazide (BENICAR HCT) 40-25 MG per tablet Take 1 tablet by mouth daily.  . pantoprazole (PROTONIX) 40 MG tablet Take 1 tablet (40 mg total) by mouth 2 (two) times daily.  . rosuvastatin (CRESTOR) 10 MG tablet Take 10 mg by mouth every other day. Take 1 tablet twice weekly  . traMADol (ULTRAM) 50 MG tablet Take 1 tablet (50 mg total) by mouth every 12 (twelve) hours as needed.  . [DISCONTINUED] insulin detemir (LEVEMIR) 100 UNIT/ML injection INJECT 50 UNITS EACH MORNING AND 52 UNITS EACH EVENING  . [DISCONTINUED] traMADol (ULTRAM) 50 MG tablet Take 0.5-1 tablets (25-50 mg total) by mouth every 12 (twelve) hours as needed.   No facility-administered encounter medications on file as of 12/21/2014.    Allergies (verified) Baxinets; Celebrex; Clarithromycin; Liraglutide; Statins; Toprol xl; Triplex ad; Welchol; and Norvasc   History: Past Medical History  Diagnosis Date  . CAD (coronary artery disease)     Nonobstructive Cath 2004  . Hypertension   . Diabetes mellitus   . Hyperlipemia   . GERD (gastroesophageal reflux disease)   . Obesity   . Pancreatitis   . Cataract    Past Surgical History  Procedure Laterality Date  . Right shoulder surgery    . Hydrocele excision / repair    . Colonoscopy  11/2003    diverticulosis, no polyps  . Upper gastrointestinal endoscopy  08/19/2010    hiatal hernia 2cm, mild gastritis   Family History  Problem Relation Age of Onset  . Heart disease Mother   . Diabetes Brother   . Heart disease Brother   . Heart  attack Brother 59  . Heart attack Father 79   Social History   Occupational History  . 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 Smoking status: Former Smoker    Types: Cigarettes    Quit date: 02/05/1984  . Smokeless tobacco: Never Used  . Alcohol Use: No  . Drug Use: No  . Sexual Activity: Yes      Objective:    Today's Vitals   12/21/14 0914  BP: 120/68  Pulse: 76  Height: 5\' 8"  (1.727 m)  Weight: 217 lb (98.431 kg)  PainSc: 4   PainLoc: Hip   Body mass index is 33 kg/(m^2).   Assessment:    Bilateral Hip Pain with effects on sleep and daily activities Type 2 diabetes - uncontrolled hyperlipidemia     Plan:   1.  Change levemir to Tresiba u-200 - Inject 106 units daily.  Will adjust every week by 2 units as needed to goal of FBG of 120 or less. 2.  Continue Synjardy 2.5/1000mg  1 tablet bid and Novolog 10 to 12 units up to tid prior to meals.  I also gave instructions for after steroid injection if BG is 250 or greater   250 to 300 = inject 10 additional units 301 to 350 = inject 12 additional units 351 to 400 = inject 15 additional units He is to call office is he has to use this scale for further instructions for long acting insulin. 3.  Continue Repatha and crestor 4.  Follow up with Dr Laurance Flatten in August and clinical pharmacist in September.   Cherre Robins, [facility]   12/21/2014

## 2014-12-21 NOTE — Patient Instructions (Signed)
If blood glucose increases to over 250 when you have steroid injection into hip the follow instructions below for Novolog and call office for adjustment in long acting insulin  250 to 300 = inject 10 additional units 301 to 350 = inject 12 additional units 351 to 400 = inject 15 additional units  Diabetes and Standards of Medical Care   Diabetes is complicated. You may find that your diabetes team includes a dietitian, nurse, diabetes educator, eye doctor, and more. To help everyone know what is going on and to help you get the care you deserve, the following schedule of care was developed to help keep you on track. Below are the tests, exams, vaccines, medicines, education, and plans you will need.  Blood Glucose Goals Prior to meals = 80 - 130 Within 2 hours of the start of a meal = less than 180  HbA1c test (goal is less than 7.0% - your last value was 8.6%) This test shows how well you have controlled your glucose over the past 2 to 3 months. It is used to see if your diabetes management plan needs to be adjusted.   It is performed at least 2 times a year if you are meeting treatment goals.  It is performed 4 times a year if therapy has changed or if you are not meeting treatment goals.  Blood pressure test  This test is performed at every routine medical visit. The goal is less than 140/90 mmHg for most people, but 130/80 mmHg in some cases. Ask your health care provider about your goal.  Dental exam  Follow up with the dentist regularly.  Eye exam  If you are diagnosed with type 1 diabetes as a child, get an exam upon reaching the age of 67 years or older and have had diabetes for 3 to 5 years. Yearly eye exams are recommended after that initial eye exam.  If you are diagnosed with type 1 diabetes as an adult, get an exam within 5 years of diagnosis and then yearly.  If you are diagnosed with type 2 diabetes, get an exam as soon as possible after the diagnosis and then  yearly.  Foot care exam  Visual foot exams are performed at every routine medical visit. The exams check for cuts, injuries, or other problems with the feet.  A comprehensive foot exam should be done yearly. This includes visual inspection as well as assessing foot pulses and testing for loss of sensation.  Check your feet nightly for cuts, injuries, or other problems with your feet. Tell your health care provider if anything is not healing.  Kidney function test (urine microalbumin)  This test is performed once a year.  Type 1 diabetes: The first test is performed 5 years after diagnosis.  Type 2 diabetes: The first test is performed at the time of diagnosis.  A serum creatinine and estimated glomerular filtration rate (eGFR) test is done once a year to assess the level of chronic kidney disease (CKD), if present.  Lipid profile (cholesterol, HDL, LDL, triglycerides)  Performed every 5 years for most people.  The goal for LDL is less than 100 mg/dL. If you are at high risk, the goal is less than 70 mg/dL.  The goal for HDL is 40 mg/dL to 50 mg/dL for men and 50 mg/dL to 60 mg/dL for women. An HDL cholesterol of 60 mg/dL or higher gives some protection against heart disease.  The goal for triglycerides is less than 150 mg/dL.  Influenza vaccine, pneumococcal vaccine, and hepatitis B vaccine  The influenza vaccine is recommended yearly.  The pneumococcal vaccine is generally given once in a lifetime. However, there are some instances when another vaccination is recommended. Check with your health care provider.  The hepatitis B vaccine is also recommended for adults with diabetes.  Diabetes self-management education  Education is recommended at diagnosis and ongoing as needed.  Treatment plan  Your treatment plan is reviewed at every medical visit.  Document Released: 04/09/2009 Document Revised: 02/12/2013 Document Reviewed: 11/12/2012 [facility] Patient Information  2014 [place].

## 2015-01-06 DIAGNOSIS — M1611 Unilateral primary osteoarthritis, right hip: Secondary | ICD-10-CM | POA: Diagnosis not present

## 2015-01-22 ENCOUNTER — Other Ambulatory Visit: Payer: Self-pay | Admitting: Family Medicine

## 2015-01-22 NOTE — Telephone Encounter (Signed)
Ultram rx ready for pick up  

## 2015-01-22 NOTE — Telephone Encounter (Signed)
Patient aware that rx is ready to be picked up.  

## 2015-01-22 NOTE — Telephone Encounter (Signed)
Last filled 12/23/14, last seen by [place] on 07/23/14

## 2015-01-25 ENCOUNTER — Telehealth: Payer: Self-pay | Admitting: Pharmacist

## 2015-01-25 NOTE — Telephone Encounter (Signed)
Patient states that his BG is much better since starting Tresiba.  He even reports 4 readings less than 70 over the last month.   Advised to decrease Tresiba dose from 106 units daily to 96 units daily.  Patient to see Dr Laurance Flatten tomorrow for follow up.

## 2015-01-26 ENCOUNTER — Encounter: Payer: Self-pay | Admitting: Family Medicine

## 2015-01-26 ENCOUNTER — Ambulatory Visit (INDEPENDENT_AMBULATORY_CARE_PROVIDER_SITE_OTHER): Payer: Medicare Other | Admitting: Family Medicine

## 2015-01-26 ENCOUNTER — Other Ambulatory Visit: Payer: Self-pay | Admitting: Family Medicine

## 2015-01-26 VITALS — BP 146/80 | HR 83 | Temp 97.2°F | Ht 68.0 in | Wt 221.0 lb

## 2015-01-26 DIAGNOSIS — N400 Enlarged prostate without lower urinary tract symptoms: Secondary | ICD-10-CM | POA: Diagnosis not present

## 2015-01-26 DIAGNOSIS — E1159 Type 2 diabetes mellitus with other circulatory complications: Secondary | ICD-10-CM | POA: Diagnosis not present

## 2015-01-26 DIAGNOSIS — E785 Hyperlipidemia, unspecified: Secondary | ICD-10-CM

## 2015-01-26 DIAGNOSIS — R7989 Other specified abnormal findings of blood chemistry: Secondary | ICD-10-CM | POA: Diagnosis not present

## 2015-01-26 DIAGNOSIS — K219 Gastro-esophageal reflux disease without esophagitis: Secondary | ICD-10-CM | POA: Diagnosis not present

## 2015-01-26 DIAGNOSIS — M170 Bilateral primary osteoarthritis of knee: Secondary | ICD-10-CM | POA: Diagnosis not present

## 2015-01-26 DIAGNOSIS — E1169 Type 2 diabetes mellitus with other specified complication: Secondary | ICD-10-CM | POA: Diagnosis not present

## 2015-01-26 DIAGNOSIS — I2510 Atherosclerotic heart disease of native coronary artery without angina pectoris: Secondary | ICD-10-CM | POA: Diagnosis not present

## 2015-01-26 DIAGNOSIS — I10 Essential (primary) hypertension: Secondary | ICD-10-CM | POA: Diagnosis not present

## 2015-01-26 LAB — POCT URINALYSIS DIPSTICK
Bilirubin, UA: NEGATIVE
Blood, UA: NEGATIVE
Glucose, UA: 250
Ketones, UA: NEGATIVE
Leukocytes, UA: NEGATIVE
NITRITE UA: NEGATIVE
Protein, UA: NEGATIVE
SPEC GRAV UA: 1.015
Urobilinogen, UA: NEGATIVE
pH, UA: 5

## 2015-01-26 LAB — POCT CBC
Granulocyte percent: 73.3 %G (ref 37–80)
HEMATOCRIT: 38.9 % — AB (ref 43.5–53.7)
Hemoglobin: 12.1 g/dL — AB (ref 14.1–18.1)
LYMPH, POC: 2 (ref 0.6–3.4)
MCH, POC: 23.9 pg — AB (ref 27–31.2)
MCHC: 31.2 g/dL — AB (ref 31.8–35.4)
MCV: 76.7 fL — AB (ref 80–97)
MPV: 8.4 fL (ref 0–99.8)
POC GRANULOCYTE: 7.9 — AB (ref 2–6.9)
POC LYMPH PERCENT: 18.1 %L (ref 10–50)
Platelet Count, POC: 370 10*3/uL (ref 142–424)
RBC: 5.08 M/uL (ref 4.69–6.13)
RDW, POC: 17.9 %
WBC: 10.8 10*3/uL — AB (ref 4.6–10.2)

## 2015-01-26 LAB — POCT UA - MICROSCOPIC ONLY
BACTERIA, U MICROSCOPIC: NEGATIVE
CASTS, UR, LPF, POC: NEGATIVE
CRYSTALS, UR, HPF, POC: NEGATIVE
Mucus, UA: NEGATIVE
RBC, URINE, MICROSCOPIC: NEGATIVE
WBC, Ur, HPF, POC: NEGATIVE
Yeast, UA: NEGATIVE

## 2015-01-26 LAB — POCT UA - MICROALBUMIN: Microalbumin Ur, POC: 20 mg/L

## 2015-01-26 MED ORDER — EMPAGLIFLOZIN-METFORMIN HCL 12.5-1000 MG PO TABS: 1.0000 | ORAL_TABLET | Freq: Two times a day (BID) | ORAL | Status: DC

## 2015-01-26 MED ORDER — INSULIN DEGLUDEC 200 UNIT/ML ~~LOC~~ SOPN: 106.0000 [IU] | PEN_INJECTOR | Freq: Every day | SUBCUTANEOUS | Status: DC

## 2015-01-26 MED ORDER — PANTOPRAZOLE SODIUM 40 MG PO TBEC: 40.0000 mg | DELAYED_RELEASE_TABLET | Freq: Two times a day (BID) | ORAL | Status: DC

## 2015-01-26 NOTE — Progress Notes (Signed)
Subjective:    Patient ID: Marvin Frost, male    DOB: 1945/11/04, 69 y.o.   MRN: [id number]  HPI Pt here for follow up and management of chronic medical problems. Which includes diabetes, hypertension and hyperlipidemia.  He is taking all prescriptions regularly. The patient today does complain of some back pain and bilateral hip pain. His last creatinine in May of this year was within normal limits. He does have a history of GERD. He brings in a list of his blood sugars and they do appear to be somewhat improved compared to the previous list in June. This was done with a change of medication. His last A1c was 8.6% and he will be due to get another one soon. The patient is also scheduled for right hip surgery and replacement soon. His recent lab work was reviewed and he is on the right path for cholesterol and his blood sugar treatment has been changed and he is doing much better with his blood sugars. As of note, the patient has gotten cardiac preapproval for his knee replacement. This was done in May of this year.     Patient Active Problem List   Diagnosis Date Noted  . Nonspecific (abnormal) findings on radiological and other examination of gastrointestinal tract 09/27/2010  . NAUSEA AND VOMITING 08/16/2010  . Type 2 diabetes mellitus not at goal 03/18/2009  . HYPERLIPIDEMIA 03/18/2009  . OBESITY 03/18/2009  . Essential hypertension 03/18/2009  . Coronary atherosclerosis 03/18/2009  . [facility] 03/18/2009  . GERD 03/18/2009   Outpatient Encounter Prescriptions as of 01/26/2015  Medication Sig  . aspirin 81 MG tablet Take 81 mg by mouth daily.    . Blood Glucose Monitoring Suppl (FREESTYLE LITE) DEVI Use to check BS bid. DX. E11.9  . Cholecalciferol (VITAMIN D3) 5000 UNITS TABS Take 1 tablet by mouth daily. And 2 tablets on weekends  . Empagliflozin-Metformin HCl (SYNJARDY) 12.10-998 MG TABS Take 1 tablet by mouth 2 (two) times daily.  . Evolocumab (REPATHA SURECLICK) 800 MG/ML SOAJ Inject 140  mg into the skin as directed. Inject subcutaneously every two weeks  . glucose blood (FREESTYLE LITE) test strip Check BS BID and PRN. DX. E11.9  . insulin aspart (NOVOLOG FLEXPEN) 100 UNIT/ML FlexPen Inject 10 Units into the skin 4 (four) times daily -  with meals and at bedtime. (Patient taking differently: Inject 12 Units into the skin 2 (two) times daily. )  . Insulin Degludec (TRESIBA FLEXTOUCH) 200 UNIT/ML SOPN Inject 106 Units into the skin daily. Inject up to 100 units once a day  . Insulin Pen Needle (NOVOTWIST) 32G X 5 MM MISC Use with insulin pens up to 5 times per day.dx.250.0  . Lancets (FREESTYLE) lancets Check BS BID and PRN. DX.E11.9  . Multiple Vitamin (MULTIVITAMIN) capsule Take 1 capsule by mouth daily.    Marland Kitchen olmesartan-hydrochlorothiazide (BENICAR HCT) 40-25 MG per tablet Take 1 tablet by mouth daily.  . pantoprazole (PROTONIX) 40 MG tablet Take 1 tablet (40 mg total) by mouth 2 (two) times daily.  . rosuvastatin (CRESTOR) 10 MG tablet Take 10 mg by mouth every other day. Take 1 tablet twice weekly  . traMADol (ULTRAM) 50 MG tablet TAKE 1 TABLET EVERY 12 HOURS AS NEEDED  . [DISCONTINUED] fluconazole (DIFLUCAN) 150 MG tablet Take 1 tablet (150 mg total) by mouth once.   No facility-administered encounter medications on file as of 01/26/2015.     Review of Systems  Constitutional: Negative.   HENT: Negative.   Eyes: Negative.  Respiratory: Negative.   Cardiovascular: Negative.   Gastrointestinal: Negative.   Endocrine: Negative.   Genitourinary: Negative.   Musculoskeletal: Positive for back pain and arthralgias (bilateral hip pain).  Skin: Negative.   Allergic/Immunologic: Negative.   Neurological: Negative.   Hematological: Negative.   Psychiatric/Behavioral: Negative.        Objective:   Physical Exam  Constitutional: He is oriented to person, place, and time. He appears well-developed and well-nourished. No distress.  HENT:  Head: Normocephalic and  atraumatic.  Right Ear: External ear normal.  Left Ear: External ear normal.  Mouth/Throat: Oropharynx is clear and moist. No oropharyngeal exudate.  Nasal pallor  Eyes: Conjunctivae and EOM are normal. Pupils are equal, round, and reactive to light. Right eye exhibits no discharge. Left eye exhibits no discharge. No scleral icterus.  Neck: Normal range of motion. Neck supple. No thyromegaly present.  Cardiovascular: Normal rate, regular rhythm, normal heart sounds and intact distal pulses.  Exam reveals no gallop and no friction rub.   No murmur heard. The rhythm is regular at 72/m  Pulmonary/Chest: Effort normal and breath sounds normal. No respiratory distress. He has no wheezes. He has no rales. He exhibits no tenderness.  No axillary adenopathy  Abdominal: Soft. Bowel sounds are normal. He exhibits no mass. There is no tenderness. There is no rebound and no guarding.  No inguinal adenopathy. No organ enlargement and no tenderness or bruits  Genitourinary: Rectum normal and penis normal.  The prostate is enlarged but soft and smooth. There are no palpable hernias and the external genitalia were within normal limits. The rectum was normal.  Musculoskeletal: He exhibits tenderness. He exhibits no edema.  The patient comes to the visit today using his cane because of his knee pain and stiffness.  Lymphadenopathy:    He has no cervical adenopathy.  Neurological: He is alert and oriented to person, place, and time. He has normal reflexes. No cranial nerve deficit.  Skin: Skin is warm and dry. No rash noted. No erythema. No pallor.  Psychiatric: He has a normal mood and affect. His behavior is normal. Judgment and thought content normal.  Nursing note and vitals reviewed.  BP 146/80 mmHg  Pulse 83  Temp(Src) 97.2 F (36.2 C) (Oral)  Ht 5\' 8"  (1.727 m)  Wt 221 lb (100.245 kg)  BMI 33.61 kg/m2        Assessment & Plan:  1. Hyperlipidemia associated with type 2 diabetes mellitus -  POCT CBC  2. Type 2 diabetes mellitus with other circulatory complications -The patient's previous A1c was 8.6%. His most recent readings are much improved and these readings were scanned into the record. - POCT CBC - POCT UA - Microalbumin  3. Low serum vitamin D -The patient will continue with current treatment pending the results of the next vitamin D level. - POCT CBC  4. Hypertension -Systolic blood pressure was slightly increased today and the patient will continue to work on his weight and sodium restriction - POCT CBC  5. BPH (benign prostatic hyperplasia) -His PSAs being done today and he is having no symptoms passing his water. - POCT CBC - POCT UA - Microscopic Only - POCT urinalysis dipstick  6. Gastroesophageal reflux disease, esophagitis presence not specified -He is currently doing well with his reflux and no changes in this treatment are recommended - POCT CBC  7. Primary osteoarthritis of both knees -He continues to be followed by his orthopedic surgeon for planned right knee replacement soon.  Meds  ordered this encounter  Medications  . Empagliflozin-Metformin HCl (SYNJARDY) 12.10-998 MG TABS    Sig: Take 1 tablet by mouth 2 (two) times daily.    Dispense:  180 tablet    Refill:  3    To replace InvokaMet due to insurance formulary  . Insulin Degludec (TRESIBA FLEXTOUCH) 200 UNIT/ML SOPN    Sig: Inject 106 Units into the skin daily. Inject up to 100 units once a day    Dispense:  60 mL    Refill:  3  . pantoprazole (PROTONIX) 40 MG tablet    Sig: Take 1 tablet (40 mg total) by mouth 2 (two) times daily.    Dispense:  180 tablet    Refill:  3   Patient Instructions                       Medicare Annual Wellness Visit  [place] and the medical providers at [facility] strive to bring you the best medical care.  In doing so we not only want to address your current medical conditions and concerns but also to detect new conditions  early and prevent illness, disease and health-related problems.    Medicare offers a yearly Wellness Visit which allows our clinical staff to assess your need for preventative services including immunizations, lifestyle education, counseling to decrease risk of preventable diseases and screening for fall risk and other medical concerns.    This visit is provided free of charge (no copay) for all Medicare recipients. The clinical pharmacists at [facility] have begun to conduct these Wellness Visits which will also include a thorough review of all your medications.    As you primary medical provider recommend that you make an appointment for your Annual Wellness Visit if you have not done so already this year.  You may set up this appointment before you leave today or you may call back ([phone]) and schedule an appointment.  Please make sure when you call that you mention that you are scheduling your Annual Wellness Visit with the clinical pharmacist so that the appointment may be made for the proper length of time.     Continue current medications. Continue good therapeutic lifestyle changes which include good diet and exercise. Fall precautions discussed with patient. If an FOBT was given today- please return it to our front desk. If you are over 42 years old - you may need Prevnar 67 or the adult Pneumonia vaccine.   After your visit with [place] today you will receive a survey in the mail or online from [employer] regarding your care with [place]. Please take a moment to fill this out. Your feedback is very important to [place] as you can help [place] better understand your patient needs as well as improve your experience and satisfaction. WE CARE ABOUT YOU!!!   Continue to follow-up with orthopedic surgeon for planned right knee surgery Continue to follow up with clinical pharmacy Continue with repatha Continue as aggressive therapeutic lifestyle changes as possible Continue monitoring  blood sugars   Arrie Senate MD

## 2015-01-26 NOTE — Patient Instructions (Addendum)
Medicare Annual Wellness Visit  [place] and the medical providers at [facility] strive to bring you the best medical care.  In doing so we not only want to address your current medical conditions and concerns but also to detect new conditions early and prevent illness, disease and health-related problems.    Medicare offers a yearly Wellness Visit which allows our clinical staff to assess your need for preventative services including immunizations, lifestyle education, counseling to decrease risk of preventable diseases and screening for fall risk and other medical concerns.    This visit is provided free of charge (no copay) for all Medicare recipients. The clinical pharmacists at [facility] have begun to conduct these Wellness Visits which will also include a thorough review of all your medications.    As you primary medical provider recommend that you make an appointment for your Annual Wellness Visit if you have not done so already this year.  You may set up this appointment before you leave today or you may call back ([phone]) and schedule an appointment.  Please make sure when you call that you mention that you are scheduling your Annual Wellness Visit with the clinical pharmacist so that the appointment may be made for the proper length of time.     Continue current medications. Continue good therapeutic lifestyle changes which include good diet and exercise. Fall precautions discussed with patient. If an FOBT was given today- please return it to our front desk. If you are over 50 years old - you may need Prevnar 88 or the adult Pneumonia vaccine.   After your visit with [place] today you will receive a survey in the mail or online from [employer] regarding your care with [place]. Please take a moment to fill this out. Your feedback is very important to [place] as you can help [place] better understand your patient needs as well as  improve your experience and satisfaction. WE CARE ABOUT YOU!!!   Continue to follow-up with orthopedic surgeon for planned right knee surgery Continue to follow up with clinical pharmacy Continue with repatha Continue as aggressive therapeutic lifestyle changes as possible Continue monitoring blood sugars

## 2015-01-27 DIAGNOSIS — M1611 Unilateral primary osteoarthritis, right hip: Secondary | ICD-10-CM | POA: Diagnosis not present

## 2015-01-27 DIAGNOSIS — M25551 Pain in right hip: Secondary | ICD-10-CM | POA: Diagnosis not present

## 2015-01-27 DIAGNOSIS — M25552 Pain in left hip: Secondary | ICD-10-CM | POA: Diagnosis not present

## 2015-01-27 LAB — MICROALBUMIN, URINE: Microalbumin, Urine: <3 ug/mL

## 2015-01-27 NOTE — Telephone Encounter (Signed)
Urine microalbumin looks good.

## 2015-01-28 ENCOUNTER — Other Ambulatory Visit: Payer: Self-pay | Admitting: Pharmacist

## 2015-01-28 ENCOUNTER — Telehealth: Payer: Self-pay | Admitting: Pharmacist

## 2015-01-28 DIAGNOSIS — IMO0002 Reserved for concepts with insufficient information to code with codable children: Secondary | ICD-10-CM

## 2015-01-28 DIAGNOSIS — E1165 Type 2 diabetes mellitus with hyperglycemia: Secondary | ICD-10-CM

## 2015-01-28 NOTE — Telephone Encounter (Signed)
Patient has had appt with ortho and they have decided to do hip replacement.  Would like to have first surgery in 3 or 4 weeks but ortho surgeon wants A1c less than 7.9%.   Patient wanted to know when is the next time he can have A1c checked so that surgery can be scheduled.  Last was 11/16/2014 and next A1c due 02/15/2014 though is patient wants to pay for A1c can do sooner.  Patient would like to wait until 02/16/2015.  Order place for patient to come in as his convenience for this.

## 2015-02-01 ENCOUNTER — Other Ambulatory Visit: Payer: Self-pay | Admitting: Pharmacist

## 2015-02-03 ENCOUNTER — Telehealth: Payer: Self-pay | Admitting: *Deleted

## 2015-02-03 NOTE — Telephone Encounter (Signed)
Dr Percival Spanish office note was routed to [facility] and Dr Paralee Cancel

## 2015-02-03 NOTE — Telephone Encounter (Addendum)
Requesting surgical clearance:   1. Type of surgery: Right Hip: THA w/wo autograft/allograft   2. Surgeon: Paralee Cancel  3. Surgical date: 03/16/2015  4. Medications that need to be help: Aspirin  5. Phone/Fax #: (p) [phone] and (F) [phone]  Can patient hold Aspirin, if so how long?

## 2015-02-09 DIAGNOSIS — M1611 Unilateral primary osteoarthritis, right hip: Secondary | ICD-10-CM | POA: Diagnosis not present

## 2015-02-15 ENCOUNTER — Telehealth: Payer: Self-pay | Admitting: Pharmacist

## 2015-02-15 NOTE — Telephone Encounter (Signed)
Patient reports that Marvin Frost is working very well.  He is getting readings in am 103, 76, 110, 102, 88.  Current dose is 60 units of [place] and is also taking Synjardy.

## 2015-02-22 ENCOUNTER — Other Ambulatory Visit: Payer: Self-pay | Admitting: Nurse Practitioner

## 2015-02-22 NOTE — Telephone Encounter (Signed)
Last filled 01/22/15, last seen 01/26/15. Rx will print

## 2015-02-23 ENCOUNTER — Telehealth: Payer: Self-pay | Admitting: Pharmacist

## 2015-02-24 ENCOUNTER — Other Ambulatory Visit: Payer: Self-pay | Admitting: Pharmacist

## 2015-02-24 DIAGNOSIS — E785 Hyperlipidemia, unspecified: Secondary | ICD-10-CM

## 2015-02-24 DIAGNOSIS — R7989 Other specified abnormal findings of blood chemistry: Secondary | ICD-10-CM

## 2015-02-24 NOTE — Telephone Encounter (Signed)
Patient spoke with hospital about labs that will be done 02/07/15. They are not going to do A1c, vitamin D or lipids.  He would like to check prior to surgery. Orders placed and patient will come in tomorrow 02/25/15.

## 2015-02-25 ENCOUNTER — Other Ambulatory Visit (INDEPENDENT_AMBULATORY_CARE_PROVIDER_SITE_OTHER): Payer: Medicare Other

## 2015-02-25 DIAGNOSIS — E1165 Type 2 diabetes mellitus with hyperglycemia: Secondary | ICD-10-CM

## 2015-02-25 DIAGNOSIS — IMO0002 Reserved for concepts with insufficient information to code with codable children: Secondary | ICD-10-CM

## 2015-02-25 DIAGNOSIS — E559 Vitamin D deficiency, unspecified: Secondary | ICD-10-CM | POA: Diagnosis not present

## 2015-02-25 DIAGNOSIS — E785 Hyperlipidemia, unspecified: Secondary | ICD-10-CM | POA: Diagnosis not present

## 2015-02-25 DIAGNOSIS — R7989 Other specified abnormal findings of blood chemistry: Secondary | ICD-10-CM | POA: Diagnosis not present

## 2015-02-25 LAB — POCT GLYCOSYLATED HEMOGLOBIN (HGB A1C): Hemoglobin A1C: 7.8

## 2015-02-25 NOTE — Progress Notes (Signed)
Lab only 

## 2015-02-26 LAB — NMR, LIPOPROFILE
Cholesterol: 129 mg/dL (ref 100–199)
HDL CHOLESTEROL BY NMR: 46 mg/dL (ref >39)
HDL PARTICLE NUMBER: 36.3 umol/L (ref >=30.5)
LDL Particle Number: 651 nmol/L (ref <1000)
LDL Size: 19.9 nm (ref >20.5)
LDL-C: 40 mg/dL (ref 0–99)
LP-IR Score: 55 — ABNORMAL HIGH (ref <=45)
SMALL LDL PARTICLE NUMBER: 503 nmol/L (ref <=527)
TRIGLYCERIDES BY NMR: 217 mg/dL — AB (ref 0–149)

## 2015-02-26 LAB — VITAMIN D 25 HYDROXY (VIT D DEFICIENCY, FRACTURES): Vit D, 25-Hydroxy: 58.5 ng/mL (ref 30.0–100.0)

## 2015-03-03 NOTE — H&P (Signed)
TOTAL HIP ADMISSION H&P  Patient is admitted for right total hip arthroplasty, anterior approach.  Subjective:  Chief Complaint:   Right hip primary OA / pain  HPI: Marvin Frost, 69 y.o. male, has a history of pain and functional disability in the right hip(s) due to arthritis and patient has failed non-surgical conservative treatments for greater than 12 weeks to include NSAID's and/or analgesics, corticosteriod injections, use of assistive devices and activity modification.  Onset of symptoms was gradual starting 2+ years ago with gradually worsening course since that time.The patient noted no past surgery on the right hip(s).  Patient currently rates pain in the right hip at 8 out of 10 with activity. Patient has night pain, worsening of pain with activity and weight bearing, trendelenberg gait, pain that interfers with activities of daily living and pain with passive range of motion. Patient has evidence of periarticular osteophytes and joint space narrowing by imaging studies. This condition presents safety issues increasing the risk of falls.  There is no current active infection.  Risks, benefits and expectations were discussed with the patient.  Risks including but not limited to the risk of anesthesia, blood clots, nerve damage, blood vessel damage, failure of the prosthesis, infection and up to and including death.  Patient understand the risks, benefits and expectations and wishes to proceed with surgery.   PCP: Redge Gainer, MD  D/C Plans:      Home with HHPT  Post-op Meds:       No Rx given   Tranexamic Acid:      To be given - IV  Decadron:      Is to be given  FYI:     ASA post-op  Norco post-op  NO HHPT    Patient Active Problem List   Diagnosis Date Noted  . Primary osteoarthritis of both knees 01/26/2015  . Nonspecific (abnormal) findings on radiological and other examination of gastrointestinal tract 09/27/2010  . NAUSEA AND VOMITING 08/16/2010  . Type 2 diabetes  mellitus not at goal 03/18/2009  . HYPERLIPIDEMIA 03/18/2009  . OBESITY 03/18/2009  . Essential hypertension 03/18/2009  . Coronary atherosclerosis 03/18/2009  . [facility] 03/18/2009  . GERD 03/18/2009   Past Medical History  Diagnosis Date  . CAD (coronary artery disease)     Nonobstructive Cath 2004  . Hypertension   . Diabetes mellitus   . Hyperlipemia   . GERD (gastroesophageal reflux disease)   . Obesity   . Pancreatitis   . Cataract     Past Surgical History  Procedure Laterality Date  . Right shoulder surgery    . Hydrocele excision / repair    . Colonoscopy  11/2003    diverticulosis, no polyps  . Upper gastrointestinal endoscopy  08/19/2010    hiatal hernia 2cm, mild gastritis    No prescriptions prior to admission   Allergies  Allergen Reactions  . Baxinets [Cetylpyridinium-Benzocaine]     unknown  . Celebrex [Celecoxib] Nausea Only  . Clarithromycin     unkown  . Liraglutide Other (See Comments)    Abdominal pain and enlarged pancreas on CT ? pancreatitis  . Statins Other (See Comments)    Unknown- muscle and joint pain if taken for long periods of time, takes Crestor every 2-3 days to avoid this reaction  . Toprol Xl [Metoprolol Succinate] Other (See Comments)    Unknown   . Triplex Ad Other (See Comments)    Abdominal pain and enlarged pancreas on CT? pancreatitis  . Welchol [employer]  Hcl] Other (See Comments)    constipation  . Norvasc [Amlodipine Besylate] Rash    Social History  Substance Use Topics  . Smoking status: Former Smoker    Types: Cigarettes    Quit date: 02/05/1984  . Smokeless tobacco: Never Used  . Alcohol Use: No    Family History  Problem Relation Age of Onset  . Heart disease Mother   . Diabetes Brother   . Heart disease Brother   . Heart attack Brother 4  . Heart attack Father 87     Review of Systems  Constitutional: Negative.   HENT: Negative.   Eyes: Negative.   Respiratory: Negative.   Cardiovascular: Negative.    Gastrointestinal: Positive for heartburn.  Genitourinary: Negative.   Musculoskeletal: Positive for joint pain.  Skin: Negative.   Neurological: Negative.   Endo/Heme/Allergies: Negative.   Psychiatric/Behavioral: Negative.     Objective:  Physical Exam  Constitutional: He is oriented to person, place, and time. He appears well-developed and well-nourished.  HENT:  Head: Normocephalic and atraumatic.  Eyes: Pupils are equal, round, and reactive to light.  Neck: Neck supple. No JVD present. No tracheal deviation present. No thyromegaly present.  Cardiovascular: Normal rate, regular rhythm, normal heart sounds and intact distal pulses.   Respiratory: Effort normal and breath sounds normal. No stridor. No respiratory distress. He has no wheezes.  GI: Soft. There is no tenderness. There is no guarding.  Musculoskeletal:       Right hip: He exhibits decreased range of motion, decreased strength, tenderness and bony tenderness. He exhibits no swelling, no deformity and no laceration.  Lymphadenopathy:    He has no cervical adenopathy.  Neurological: He is alert and oriented to person, place, and time.  Skin: Skin is warm and dry.  Psychiatric: He has a normal mood and affect.      Labs:  Estimated body mass index is 33.61 kg/(m^2) as calculated from the following:   Height as of 01/26/15: 5\' 8"  (1.727 m).   Weight as of 01/26/15: 100.245 kg (221 lb).   Imaging Review Plain radiographs demonstrate severe degenerative joint disease of the right hip(s). The bone quality appears to be good for age and reported activity level.  Assessment/Plan:  End stage arthritis, right hip(s)  The patient history, physical examination, clinical judgement of the provider and imaging studies are consistent with end stage degenerative joint disease of the right hip(s) and total hip arthroplasty is deemed medically necessary. The treatment options including medical management, injection therapy,  arthroscopy and arthroplasty were discussed at length. The risks and benefits of total hip arthroplasty were presented and reviewed. The risks due to aseptic loosening, infection, stiffness, dislocation/subluxation,  thromboembolic complications and other imponderables were discussed.  The patient acknowledged the explanation, agreed to proceed with the plan and consent was signed. Patient is being admitted for inpatient treatment for surgery, pain control, PT, OT, prophylactic antibiotics, VTE prophylaxis, progressive ambulation and ADL's and discharge planning.The patient is planning to be discharged home with home health services.     [place] Kasara Schomer   PA-C  03/03/2015, 1:36 PM

## 2015-03-04 ENCOUNTER — Encounter: Payer: Self-pay | Admitting: *Deleted

## 2015-03-08 NOTE — Patient Instructions (Addendum)
Marvin Frost  03/08/2015   Your procedure is scheduled on:   03/16/2015    Report to [facility] Main  Entrance take [place]  elevators to 3rd floor to  [place] at    [place] AM.  Call this number if you have problems the morning of surgery [phone]   Remember: ONLY 1 PERSON MAY GO WITH YOU TO SHORT STAY TO GET  READY MORNING OF [place].  Do not eat food or drink liquids :After Midnight.            EAt a good healthy snack prior to bedtime.              No diabetic medications or Insulin am of surgery.              Take 1/2 of evening doses of Insulin nite before surgery      Take these medicines the morning of surgery with A SIP OF WATER: Protonix                                You may not have any metal on your body including hair pins and              piercings  Do not wear jewelry, , lotions, powders or perfumes, deodorant                      Men may shave face and neck.   Do not bring valuables to the hospital. [place].  Contacts, dentures or bridgework may not be worn into surgery.  Leave suitcase in the car. After surgery it may be brought to your room.         Special Instructions: coughing and deep breathing exercises, leg exercises               Please read over the following fact sheets you were given: _____________________________________________________________________             [facility] - Preparing for Surgery Before surgery, you can play an important role.  Because skin is not sterile, your skin needs to be as free of germs as possible.  You can reduce the number of germs on your skin by washing with CHG (chlorahexidine gluconate) soap before surgery.  CHG is an antiseptic cleaner which kills germs and bonds with the skin to continue killing germs even after washing. Please DO NOT use if you have an allergy to CHG or antibacterial soaps.  If your skin becomes  reddened/irritated stop using the CHG and inform your nurse when you arrive at Short Stay. Do not shave (including legs and underarms) for at least 48 hours prior to the first CHG shower.  You may shave your face/neck. Please follow these instructions carefully:  1.  Shower with CHG Soap the night before surgery and the  morning of Surgery.  2.  If you choose to wash your hair, wash your hair first as usual with your  normal  shampoo.  3.  After you shampoo, rinse your hair and body thoroughly to remove the  shampoo.  4.  Use CHG as you would any other liquid soap.  You can apply chg directly  to the skin and wash                       Gently with a scrungie or clean washcloth.  5.  Apply the CHG Soap to your body ONLY FROM THE NECK DOWN.   Do not use on face/ open                           Wound or open sores. Avoid contact with eyes, ears mouth and genitals (private parts).                       Wash face,  Genitals (private parts) with your normal soap.             6.  Wash thoroughly, paying special attention to the area where your surgery  will be performed.  7.  Thoroughly rinse your body with warm water from the neck down.  8.  DO NOT shower/wash with your normal soap after using and rinsing off  the CHG Soap.                9.  Pat yourself dry with a clean towel.            10.  Wear clean pajamas.            11.  Place clean sheets on your bed the night of your first shower and do not  sleep with pets. Day of Surgery : Do not apply any lotions/deodorants the morning of surgery.  Please wear clean clothes to the hospital/surgery center.  FAILURE TO FOLLOW THESE INSTRUCTIONS MAY RESULT IN THE CANCELLATION OF YOUR SURGERY PATIENT SIGNATURE_________________________________  NURSE SIGNATURE__________________________________  ________________________________________________________________________  WHAT IS A BLOOD TRANSFUSION? Blood Transfusion Information  A  transfusion is the replacement of blood or some of its parts. Blood is made up of multiple cells which provide different functions.  Red blood cells carry oxygen and are used for blood loss replacement.  White blood cells fight against infection.  Platelets control bleeding.  Plasma helps clot blood.  Other blood products are available for specialized needs, such as hemophilia or other clotting disorders. BEFORE THE TRANSFUSION  Who gives blood for transfusions?   Healthy volunteers who are fully evaluated to make sure their blood is safe. This is blood bank blood. Transfusion therapy is the safest it has ever been in the practice of medicine. Before blood is taken from a donor, a complete history is taken to make sure that person has no history of diseases nor engages in risky social behavior (examples are intravenous drug use or sexual activity with multiple partners). The donor's travel history is screened to minimize risk of transmitting infections, such as malaria. The donated blood is tested for signs of infectious diseases, such as HIV and hepatitis. The blood is then tested to be sure it is compatible with you in order to minimize the chance of a transfusion reaction. If you or a relative donates blood, this is often done in anticipation of surgery and is not appropriate for emergency situations. It takes many days to process the donated blood. RISKS AND COMPLICATIONS Although transfusion therapy is very safe and saves many lives, the main dangers of transfusion include:  1. Getting an infectious disease. 2. Developing a transfusion reaction. This  is an allergic reaction to something in the blood you were given. Every precaution is taken to prevent this. The decision to have a blood transfusion has been considered carefully by your caregiver before blood is given. Blood is not given unless the benefits outweigh the risks. AFTER THE TRANSFUSION  Right after receiving a blood  transfusion, you will usually feel much better and more energetic. This is especially true if your red blood cells have gotten low (anemic). The transfusion raises the level of the red blood cells which carry oxygen, and this usually causes an energy increase.  The nurse administering the transfusion will monitor you carefully for complications. HOME CARE INSTRUCTIONS  No special instructions are needed after a transfusion. You may find your energy is better. Speak with your caregiver about any limitations on activity for underlying diseases you may have. SEEK MEDICAL CARE IF:   Your condition is not improving after your transfusion.  You develop redness or irritation at the intravenous (IV) site. SEEK IMMEDIATE MEDICAL CARE IF:  Any of the following symptoms occur over the next 12 hours:  Shaking chills.  You have a temperature by mouth above 102 F (38.9 C), not controlled by medicine.  Chest, back, or muscle pain.  People around you feel you are not acting correctly or are confused.  Shortness of breath or difficulty breathing.  Dizziness and fainting.  You get a rash or develop hives.  You have a decrease in urine output.  Your urine turns a dark color or changes to pink, red, or brown. Any of the following symptoms occur over the next 10 days:  You have a temperature by mouth above 102 F (38.9 C), not controlled by medicine.  Shortness of breath.  Weakness after normal activity.  The white part of the eye turns yellow (jaundice).  You have a decrease in the amount of urine or are urinating less often.  Your urine turns a dark color or changes to pink, red, or brown. Document Released: 06/09/2000 Document Revised: 09/04/2011 Document Reviewed: 01/27/2008 ExitCare Patient Information 2014 [place].  _______________________________________________________________________  Incentive Spirometer  An incentive spirometer is a tool that can help keep your  lungs clear and active. This tool measures how well you are filling your lungs with each breath. Taking long deep breaths may help reverse or decrease the chance of developing breathing (pulmonary) problems (especially infection) following:  A long period of time when you are unable to move or be active. BEFORE THE PROCEDURE   If the spirometer includes an indicator to show your best effort, your nurse or respiratory therapist will set it to a desired goal.  If possible, sit up straight or lean slightly forward. Try not to slouch.  Hold the incentive spirometer in an upright position. INSTRUCTIONS FOR USE  3. Sit on the edge of your bed if possible, or sit up as far as you can in bed or on a chair. 4. Hold the incentive spirometer in an upright position. 5. Breathe out normally. 6. Place the mouthpiece in your mouth and seal your lips tightly around it. 7. Breathe in slowly and as deeply as possible, raising the piston or the ball toward the top of the column. 8. Hold your breath for 3-5 seconds or for as long as possible. Allow the piston or ball to fall to the bottom of the column. 9. Remove the mouthpiece from your mouth and breathe out normally. 10. Rest for a few seconds and repeat Steps 1  through 7 at least 10 times every 1-2 hours when you are awake. Take your time and take a few normal breaths between deep breaths. 11. The spirometer may include an indicator to show your best effort. Use the indicator as a goal to work toward during each repetition. 12. After each set of 10 deep breaths, practice coughing to be sure your lungs are clear. If you have an incision (the cut made at the time of surgery), support your incision when coughing by placing a pillow or rolled up towels firmly against it. Once you are able to get out of bed, walk around indoors and cough well. You may stop using the incentive spirometer when instructed by your caregiver.  RISKS AND COMPLICATIONS  Take your time so  you do not get dizzy or light-headed.  If you are in pain, you may need to take or ask for pain medication before doing incentive spirometry. It is harder to take a deep breath if you are having pain. AFTER USE  Rest and breathe slowly and easily.  It can be helpful to keep track of a log of your progress. Your caregiver can provide you with a simple table to help with this. If you are using the spirometer at home, follow these instructions: [place] IF:   You are having difficultly using the spirometer.  You have trouble using the spirometer as often as instructed.  Your pain medication is not giving enough relief while using the spirometer.  You develop fever of 100.5 F (38.1 C) or higher. SEEK IMMEDIATE MEDICAL CARE IF:   You cough up bloody sputum that had not been present before.  You develop fever of 102 F (38.9 C) or greater.  You develop worsening pain at or near the incision site. MAKE SURE YOU:   Understand these instructions.  Will watch your condition.  Will get help right away if you are not doing well or get worse. Document Released: 10/23/2006 Document Revised: 09/04/2011 Document Reviewed: 12/24/2006 [facility] Patient Information 2014 [place], [place].   ________________________________________________________________________

## 2015-03-10 ENCOUNTER — Encounter (HOSPITAL_COMMUNITY)
Admission: RE | Admit: 2015-03-10 | Discharge: 2015-03-10 | Disposition: A | Discharge status: Home / Self Care | Payer: Medicare Other | Source: Ambulatory Visit | Attending: Orthopedic Surgery | Admitting: Orthopedic Surgery

## 2015-03-10 ENCOUNTER — Encounter (HOSPITAL_COMMUNITY): Payer: Self-pay

## 2015-03-10 DIAGNOSIS — M1611 Unilateral primary osteoarthritis, right hip: Secondary | ICD-10-CM | POA: Insufficient documentation

## 2015-03-10 DIAGNOSIS — Z01818 Encounter for other preprocedural examination: Secondary | ICD-10-CM | POA: Insufficient documentation

## 2015-03-10 HISTORY — DX: Unspecified osteoarthritis, unspecified site: M19.90

## 2015-03-10 HISTORY — DX: Malignant (primary) neoplasm, unspecified: C80.1

## 2015-03-10 LAB — BASIC METABOLIC PANEL
Anion gap: 8 (ref 5–15)
BUN: 15 mg/dL (ref 6–20)
CO2: 27 mmol/L (ref 22–32)
CREATININE: 0.93 mg/dL (ref 0.61–1.24)
Calcium: 9.2 mg/dL (ref 8.9–10.3)
Chloride: 103 mmol/L (ref 101–111)
GFR calc Af Amer: >60 mL/min (ref >60)
GLUCOSE: 113 mg/dL — AB (ref 65–99)
Potassium: 4.5 mmol/L (ref 3.5–5.1)
Sodium: 138 mmol/L (ref 135–145)

## 2015-03-10 LAB — CBC
HCT: 38.8 % — ABNORMAL LOW (ref 39.0–52.0)
HEMOGLOBIN: 11.9 g/dL — AB (ref 13.0–17.0)
MCH: 23.2 pg — AB (ref 26.0–34.0)
MCHC: 30.7 g/dL (ref 30.0–36.0)
MCV: 75.8 fL — ABNORMAL LOW (ref 78.0–100.0)
Platelets: 352 10*3/uL (ref 150–400)
RBC: 5.12 MIL/uL (ref 4.22–5.81)
RDW: 16.3 % — AB (ref 11.5–15.5)
WBC: 10.2 10*3/uL (ref 4.0–10.5)

## 2015-03-10 LAB — URINALYSIS, ROUTINE W REFLEX MICROSCOPIC
BILIRUBIN URINE: NEGATIVE
Glucose, UA: >1000 mg/dL — AB
Hgb urine dipstick: NEGATIVE
KETONES UR: NEGATIVE mg/dL
LEUKOCYTES UA: NEGATIVE
NITRITE: NEGATIVE
Protein, ur: NEGATIVE mg/dL
SPECIFIC GRAVITY, URINE: 1.029 (ref 1.005–1.030)
UROBILINOGEN UA: 0.2 mg/dL (ref 0.0–1.0)
pH: 5 (ref 5.0–8.0)

## 2015-03-10 LAB — PROTIME-INR
INR: 0.98 (ref 0.00–1.49)
Prothrombin Time: 13.2 seconds (ref 11.6–15.2)

## 2015-03-10 LAB — SURGICAL PCR SCREEN
MRSA, PCR: NEGATIVE
STAPHYLOCOCCUS AUREUS: NEGATIVE

## 2015-03-10 LAB — URINE MICROSCOPIC-ADD ON

## 2015-03-10 LAB — ABO/RH: ABO/RH(D): A POS

## 2015-03-10 LAB — APTT: aPTT: 30 seconds (ref 24–37)

## 2015-03-10 NOTE — Progress Notes (Signed)
U/A and micro done 03/10/2015 faxed via EPIC to Dr Alvan Dame.

## 2015-03-10 NOTE — Progress Notes (Addendum)
EKG- 11/24/2014- EPIC  LOV- 11/24/2014- LOV- in EPIC and on chart - Dr Percival Spanish

## 2015-03-15 ENCOUNTER — Encounter (HOSPITAL_COMMUNITY): Payer: Self-pay | Admitting: Anesthesiology

## 2015-03-15 NOTE — Anesthesia Preprocedure Evaluation (Addendum)
Anesthesia Evaluation  Patient identified by MRN, date of birth, ID band Patient awake    Reviewed: Allergy & Precautions, NPO status , Patient's Chart, lab work & pertinent test results  Airway Mallampati: II  TM Distance: >3 FB Neck ROM: Full    Dental no notable dental hx.    Pulmonary former smoker,    Pulmonary exam normal breath sounds clear to auscultation       Cardiovascular hypertension, Pt. on medications + CAD  Normal cardiovascular exam Rhythm:Regular Rate:Normal  ECG 11-24-14: NSR, LAD, inferior Q waves in III and AvF   Neuro/Psych negative neurological ROS  negative psych ROS   GI/Hepatic Neg liver ROS, GERD  ,  Endo/Other  diabetes, Type 2, Oral Hypoglycemic Agents, Insulin Dependent  Renal/GU negative Renal ROS  negative genitourinary   Musculoskeletal  (+) Arthritis ,   Abdominal (+) + obese,   Peds negative pediatric ROS (+)  Hematology negative hematology ROS (+)   Anesthesia Other Findings   Reproductive/Obstetrics negative OB ROS                           Anesthesia Physical Anesthesia Plan  ASA: III  Anesthesia Plan: Spinal   Post-op Pain Management:    Induction: Intravenous  Airway Management Planned:   Additional Equipment:   Intra-op Plan:   Post-operative Plan: Extubation in OR  Informed Consent: I have reviewed the patients History and Physical, chart, labs and discussed the procedure including the risks, benefits and alternatives for the proposed anesthesia with the patient or authorized representative who has indicated his/her understanding and acceptance.   Dental advisory given  Plan Discussed with: CRNA  Anesthesia Plan Comments: (Discussed risks and benefits of and differences between spinal and general. Discussed risks of spinal including headache, backache, failure, bleeding and hematoma, infection, and nerve damage and paralysis. Patient  consents to spinal. Questions answered. Coagulation studies and platelet count acceptable. )       Anesthesia Quick Evaluation

## 2015-03-16 ENCOUNTER — Encounter (HOSPITAL_COMMUNITY)
Admission: RE | Disposition: A | Discharge status: Home Health | Payer: Self-pay | Source: Ambulatory Visit | Attending: Orthopedic Surgery

## 2015-03-16 ENCOUNTER — Inpatient Hospital Stay (HOSPITAL_COMMUNITY): Payer: Medicare Other

## 2015-03-16 ENCOUNTER — Inpatient Hospital Stay (HOSPITAL_COMMUNITY): Payer: Medicare Other | Admitting: Anesthesiology

## 2015-03-16 ENCOUNTER — Encounter (HOSPITAL_COMMUNITY): Payer: Self-pay

## 2015-03-16 ENCOUNTER — Inpatient Hospital Stay (HOSPITAL_COMMUNITY)
Admission: RE | Admit: 2015-03-16 | Discharge: 2015-03-17 | DRG: 470 | Disposition: A | Discharge status: Home Health | Payer: Medicare Other | Source: Ambulatory Visit | Attending: Orthopedic Surgery | Admitting: Orthopedic Surgery

## 2015-03-16 DIAGNOSIS — M1611 Unilateral primary osteoarthritis, right hip: Secondary | ICD-10-CM | POA: Diagnosis not present

## 2015-03-16 DIAGNOSIS — M25551 Pain in right hip: Secondary | ICD-10-CM | POA: Diagnosis not present

## 2015-03-16 DIAGNOSIS — K219 Gastro-esophageal reflux disease without esophagitis: Secondary | ICD-10-CM | POA: Diagnosis present

## 2015-03-16 DIAGNOSIS — E785 Hyperlipidemia, unspecified: Secondary | ICD-10-CM | POA: Diagnosis present

## 2015-03-16 DIAGNOSIS — Z8249 Family history of ischemic heart disease and other diseases of the circulatory system: Secondary | ICD-10-CM | POA: Diagnosis not present

## 2015-03-16 DIAGNOSIS — E669 Obesity, unspecified: Secondary | ICD-10-CM | POA: Diagnosis present

## 2015-03-16 DIAGNOSIS — Z87891 Personal history of nicotine dependence: Secondary | ICD-10-CM

## 2015-03-16 DIAGNOSIS — E119 Type 2 diabetes mellitus without complications: Secondary | ICD-10-CM | POA: Diagnosis present

## 2015-03-16 DIAGNOSIS — I10 Essential (primary) hypertension: Secondary | ICD-10-CM | POA: Diagnosis present

## 2015-03-16 DIAGNOSIS — I2510 Atherosclerotic heart disease of native coronary artery without angina pectoris: Secondary | ICD-10-CM | POA: Diagnosis present

## 2015-03-16 DIAGNOSIS — Z96641 Presence of right artificial hip joint: Secondary | ICD-10-CM | POA: Diagnosis not present

## 2015-03-16 DIAGNOSIS — Z6833 Body mass index (BMI) 33.0-33.9, adult: Secondary | ICD-10-CM | POA: Diagnosis not present

## 2015-03-16 DIAGNOSIS — M170 Bilateral primary osteoarthritis of knee: Secondary | ICD-10-CM | POA: Diagnosis present

## 2015-03-16 DIAGNOSIS — Z01812 Encounter for preprocedural laboratory examination: Secondary | ICD-10-CM | POA: Diagnosis not present

## 2015-03-16 DIAGNOSIS — Z833 Family history of diabetes mellitus: Secondary | ICD-10-CM

## 2015-03-16 DIAGNOSIS — Z471 Aftercare following joint replacement surgery: Secondary | ICD-10-CM | POA: Diagnosis not present

## 2015-03-16 DIAGNOSIS — M169 Osteoarthritis of hip, unspecified: Secondary | ICD-10-CM | POA: Diagnosis not present

## 2015-03-16 DIAGNOSIS — Z96649 Presence of unspecified artificial hip joint: Secondary | ICD-10-CM

## 2015-03-16 HISTORY — PX: TOTAL HIP ARTHROPLASTY: SHX124

## 2015-03-16 LAB — GLUCOSE, CAPILLARY
GLUCOSE-CAPILLARY: 144 mg/dL — AB (ref 65–99)
GLUCOSE-CAPILLARY: 119 mg/dL — AB (ref 65–99)
GLUCOSE-CAPILLARY: 173 mg/dL — AB (ref 65–99)
Glucose-Capillary: 206 mg/dL — ABNORMAL HIGH (ref 65–99)

## 2015-03-16 LAB — TYPE AND SCREEN
ABO/RH(D): A POS
ANTIBODY SCREEN: NEGATIVE

## 2015-03-16 SURGERY — ARTHROPLASTY, HIP, TOTAL, ANTERIOR APPROACH
Anesthesia: Spinal | Laterality: Right

## 2015-03-16 MED ORDER — DEXTROSE 5 % IV SOLN
10.0000 mg | INTRAVENOUS | Status: DC | PRN
  Administered 2015-03-16: 25 ug/min via INTRAVENOUS

## 2015-03-16 MED ORDER — POTASSIUM CHLORIDE 2 MEQ/ML IV SOLN
100.0000 mL/h | INTRAVENOUS | Status: DC
  Administered 2015-03-16 (×2): 100 mL/h via INTRAVENOUS
  Filled 2015-03-16 (×4): qty 1000

## 2015-03-16 MED ORDER — LACTATED RINGERS IV SOLN
INTRAVENOUS | Status: DC | PRN
  Administered 2015-03-16 (×2): via INTRAVENOUS

## 2015-03-16 MED ORDER — PROPOFOL 10 MG/ML IV BOLUS
INTRAVENOUS | Status: AC
Start: 2015-03-16 — End: 2015-03-16
  Filled 2015-03-16: qty 20

## 2015-03-16 MED ORDER — INSULIN DEGLUDEC 200 UNIT/ML ~~LOC~~ SOPN: 60.0000 [IU] | PEN_INJECTOR | Freq: Every day | SUBCUTANEOUS | Status: DC

## 2015-03-16 MED ORDER — OLMESARTAN MEDOXOMIL-HCTZ 40-25 MG PO TABS: 1.0000 | ORAL_TABLET | ORAL | Status: DC

## 2015-03-16 MED ORDER — CEFAZOLIN SODIUM-DEXTROSE 2-3 GM-% IV SOLR
2.0000 g | INTRAVENOUS | Status: AC
  Administered 2015-03-16: 2 g via INTRAVENOUS

## 2015-03-16 MED ORDER — PROPOFOL 10 MG/ML IV BOLUS
INTRAVENOUS | Status: AC
  Filled 2015-03-16: qty 20

## 2015-03-16 MED ORDER — ROSUVASTATIN CALCIUM 10 MG PO TABS
10.0000 mg | ORAL_TABLET | ORAL | Status: DC
  Filled 2015-03-16: qty 1

## 2015-03-16 MED ORDER — INSULIN ASPART 100 UNIT/ML ~~LOC~~ SOLN
0.0000 [IU] | Freq: Three times a day (TID) | SUBCUTANEOUS | Status: DC
  Administered 2015-03-16 – 2015-03-17 (×2): 3 [IU] via SUBCUTANEOUS
  Administered 2015-03-17: 2 [IU] via SUBCUTANEOUS

## 2015-03-16 MED ORDER — HYDROCODONE-ACETAMINOPHEN 7.5-325 MG PO TABS
1.0000 | ORAL_TABLET | ORAL | Status: DC
  Administered 2015-03-16: 2 via ORAL
  Administered 2015-03-16: 1 via ORAL
  Administered 2015-03-16 – 2015-03-17 (×4): 2 via ORAL
  Filled 2015-03-16: qty 1
  Filled 2015-03-16 (×5): qty 2

## 2015-03-16 MED ORDER — EMPAGLIFLOZIN-METFORMIN HCL 12.5-1000 MG PO TABS
1.0000 | ORAL_TABLET | Freq: Two times a day (BID) | ORAL | Status: DC
  Administered 2015-03-16 – 2015-03-17 (×2): 1 via ORAL

## 2015-03-16 MED ORDER — BISACODYL 10 MG RE SUPP: 10.0000 mg | Freq: Every day | RECTAL | Status: DC | PRN

## 2015-03-16 MED ORDER — HYDROMORPHONE HCL 1 MG/ML IJ SOLN
INTRAMUSCULAR | Status: AC
Start: 2015-03-16 — End: 2015-03-16
  Filled 2015-03-16: qty 1

## 2015-03-16 MED ORDER — MAGNESIUM CITRATE PO SOLN: 1.0000 | Freq: Once | ORAL | Status: DC | PRN

## 2015-03-16 MED ORDER — DEXAMETHASONE SODIUM PHOSPHATE 10 MG/ML IJ SOLN
INTRAMUSCULAR | Status: AC
  Filled 2015-03-16: qty 1

## 2015-03-16 MED ORDER — DEXAMETHASONE SODIUM PHOSPHATE 10 MG/ML IJ SOLN
10.0000 mg | Freq: Once | INTRAMUSCULAR | Status: AC
  Administered 2015-03-17: 10 mg via INTRAVENOUS
  Filled 2015-03-16: qty 1

## 2015-03-16 MED ORDER — MENTHOL 3 MG MT LOZG: 1.0000 | LOZENGE | OROMUCOSAL | Status: DC | PRN

## 2015-03-16 MED ORDER — PHENYLEPHRINE 40 MCG/ML (10ML) SYRINGE FOR IV PUSH (FOR BLOOD PRESSURE SUPPORT)
PREFILLED_SYRINGE | INTRAVENOUS | Status: AC
  Filled 2015-03-16: qty 10

## 2015-03-16 MED ORDER — CHLORHEXIDINE GLUCONATE 4 % EX LIQD: 60.0000 mL | Freq: Once | CUTANEOUS | Status: DC

## 2015-03-16 MED ORDER — CEFAZOLIN SODIUM-DEXTROSE 2-3 GM-% IV SOLR
INTRAVENOUS | Status: AC
  Filled 2015-03-16: qty 50

## 2015-03-16 MED ORDER — HYDROCHLOROTHIAZIDE 25 MG PO TABS
25.0000 mg | ORAL_TABLET | Freq: Every day | ORAL | Status: DC
  Administered 2015-03-16 – 2015-03-17 (×2): 25 mg via ORAL
  Filled 2015-03-16 (×2): qty 1

## 2015-03-16 MED ORDER — METHOCARBAMOL 500 MG PO TABS: 500.0000 mg | ORAL_TABLET | Freq: Four times a day (QID) | ORAL | Status: DC | PRN

## 2015-03-16 MED ORDER — METOCLOPRAMIDE HCL 10 MG PO TABS: 5.0000 mg | ORAL_TABLET | Freq: Three times a day (TID) | ORAL | Status: DC | PRN

## 2015-03-16 MED ORDER — DOCUSATE SODIUM 100 MG PO CAPS
100.0000 mg | ORAL_CAPSULE | Freq: Two times a day (BID) | ORAL | Status: DC
  Administered 2015-03-16 – 2015-03-17 (×2): 100 mg via ORAL

## 2015-03-16 MED ORDER — IRBESARTAN 300 MG PO TABS
300.0000 mg | ORAL_TABLET | Freq: Every day | ORAL | Status: DC
  Administered 2015-03-16 – 2015-03-17 (×2): 300 mg via ORAL
  Filled 2015-03-16 (×2): qty 1

## 2015-03-16 MED ORDER — CEFAZOLIN SODIUM-DEXTROSE 2-3 GM-% IV SOLR
2.0000 g | Freq: Four times a day (QID) | INTRAVENOUS | Status: AC
  Administered 2015-03-16 (×2): 2 g via INTRAVENOUS
  Filled 2015-03-16 (×3): qty 50

## 2015-03-16 MED ORDER — METOCLOPRAMIDE HCL 5 MG/ML IJ SOLN: 10.0000 mg | Freq: Once | INTRAMUSCULAR | Status: DC | PRN

## 2015-03-16 MED ORDER — PANTOPRAZOLE SODIUM 40 MG PO TBEC
40.0000 mg | DELAYED_RELEASE_TABLET | Freq: Two times a day (BID) | ORAL | Status: DC
  Administered 2015-03-16 – 2015-03-17 (×2): 40 mg via ORAL
  Filled 2015-03-16 (×3): qty 1

## 2015-03-16 MED ORDER — FENTANYL CITRATE (PF) 250 MCG/5ML IJ SOLN
INTRAMUSCULAR | Status: DC | PRN
Start: 2015-03-16 — End: 2015-03-16
  Administered 2015-03-16: 50 ug via INTRAVENOUS

## 2015-03-16 MED ORDER — MIDAZOLAM HCL 2 MG/2ML IJ SOLN
INTRAMUSCULAR | Status: DC | PRN
  Administered 2015-03-16: 2 mg via INTRAVENOUS

## 2015-03-16 MED ORDER — PHENYLEPHRINE HCL 10 MG/ML IJ SOLN
INTRAMUSCULAR | Status: DC | PRN
  Administered 2015-03-16 (×2): 80 ug via INTRAVENOUS

## 2015-03-16 MED ORDER — LIDOCAINE HCL (CARDIAC) 20 MG/ML IV SOLN
INTRAVENOUS | Status: AC
  Filled 2015-03-16: qty 5

## 2015-03-16 MED ORDER — KETAMINE HCL 10 MG/ML IJ SOLN
INTRAMUSCULAR | Status: AC
  Filled 2015-03-16: qty 1

## 2015-03-16 MED ORDER — HYDROMORPHONE HCL 1 MG/ML IJ SOLN
0.5000 mg | INTRAMUSCULAR | Status: DC | PRN
  Administered 2015-03-16: 1 mg via INTRAVENOUS
  Administered 2015-03-16 (×2): 0.5 mg via INTRAVENOUS
  Filled 2015-03-16 (×3): qty 1

## 2015-03-16 MED ORDER — BUPIVACAINE IN DEXTROSE 0.75-8.25 % IT SOLN
INTRATHECAL | Status: DC | PRN
  Administered 2015-03-16: 2 mL via INTRATHECAL

## 2015-03-16 MED ORDER — DEXAMETHASONE SODIUM PHOSPHATE 10 MG/ML IJ SOLN
10.0000 mg | Freq: Once | INTRAMUSCULAR | Status: AC
  Administered 2015-03-16: 10 mg via INTRAVENOUS

## 2015-03-16 MED ORDER — FERROUS SULFATE 325 (65 FE) MG PO TABS
325.0000 mg | ORAL_TABLET | Freq: Three times a day (TID) | ORAL | Status: DC
  Administered 2015-03-16 – 2015-03-17 (×3): 325 mg via ORAL
  Filled 2015-03-16 (×5): qty 1

## 2015-03-16 MED ORDER — ONDANSETRON HCL 4 MG PO TABS: 4.0000 mg | ORAL_TABLET | Freq: Four times a day (QID) | ORAL | Status: DC | PRN

## 2015-03-16 MED ORDER — INSULIN DEGLUDEC 200 UNIT/ML ~~LOC~~ SOPN
10.0000 [IU] | PEN_INJECTOR | Freq: Every day | SUBCUTANEOUS | Status: DC
  Administered 2015-03-16: 50 [IU] via SUBCUTANEOUS

## 2015-03-16 MED ORDER — KETAMINE HCL 10 MG/ML IJ SOLN
INTRAMUSCULAR | Status: DC | PRN
  Administered 2015-03-16: 20 mg via INTRAVENOUS

## 2015-03-16 MED ORDER — PROPOFOL INFUSION 10 MG/ML OPTIME
INTRAVENOUS | Status: DC | PRN
  Administered 2015-03-16: 75 ug/kg/min via INTRAVENOUS

## 2015-03-16 MED ORDER — HYDROMORPHONE HCL 1 MG/ML IJ SOLN
0.2500 mg | INTRAMUSCULAR | Status: DC | PRN
  Administered 2015-03-16 (×2): 0.5 mg via INTRAVENOUS

## 2015-03-16 MED ORDER — MIDAZOLAM HCL 2 MG/2ML IJ SOLN
INTRAMUSCULAR | Status: AC
  Filled 2015-03-16: qty 4

## 2015-03-16 MED ORDER — ASPIRIN EC 325 MG PO TBEC
325.0000 mg | DELAYED_RELEASE_TABLET | Freq: Two times a day (BID) | ORAL | Status: DC
  Administered 2015-03-17: 325 mg via ORAL
  Filled 2015-03-16 (×3): qty 1

## 2015-03-16 MED ORDER — PHENOL 1.4 % MT LIQD: 1.0000 | OROMUCOSAL | Status: DC | PRN

## 2015-03-16 MED ORDER — FENTANYL CITRATE (PF) 250 MCG/5ML IJ SOLN
INTRAMUSCULAR | Status: AC
  Filled 2015-03-16: qty 25

## 2015-03-16 MED ORDER — TRANEXAMIC ACID 1000 MG/10ML IV SOLN
1000.0000 mg | Freq: Once | INTRAVENOUS | Status: AC
  Administered 2015-03-16: 1000 mg via INTRAVENOUS
  Filled 2015-03-16: qty 10

## 2015-03-16 MED ORDER — METHOCARBAMOL 1000 MG/10ML IJ SOLN
500.0000 mg | Freq: Four times a day (QID) | INTRAVENOUS | Status: DC | PRN
  Administered 2015-03-16: 500 mg via INTRAVENOUS
  Filled 2015-03-16 (×2): qty 5

## 2015-03-16 MED ORDER — POLYETHYLENE GLYCOL 3350 17 G PO PACK
17.0000 g | PACK | Freq: Two times a day (BID) | ORAL | Status: DC
  Administered 2015-03-16 – 2015-03-17 (×2): 17 g via ORAL

## 2015-03-16 MED ORDER — ONDANSETRON HCL 4 MG/2ML IJ SOLN: 4.0000 mg | Freq: Four times a day (QID) | INTRAMUSCULAR | Status: DC | PRN

## 2015-03-16 MED ORDER — ONDANSETRON HCL 4 MG/2ML IJ SOLN
INTRAMUSCULAR | Status: AC
  Filled 2015-03-16: qty 2

## 2015-03-16 MED ORDER — PHENYLEPHRINE HCL 10 MG/ML IJ SOLN
INTRAMUSCULAR | Status: AC
  Filled 2015-03-16: qty 1

## 2015-03-16 MED ORDER — EPHEDRINE SULFATE 50 MG/ML IJ SOLN
INTRAMUSCULAR | Status: AC
  Filled 2015-03-16: qty 1

## 2015-03-16 MED ORDER — METOCLOPRAMIDE HCL 5 MG/ML IJ SOLN: 5.0000 mg | Freq: Three times a day (TID) | INTRAMUSCULAR | Status: DC | PRN

## 2015-03-16 MED ORDER — ROCURONIUM BROMIDE 100 MG/10ML IV SOLN
INTRAVENOUS | Status: AC
  Filled 2015-03-16: qty 1

## 2015-03-16 MED ORDER — SODIUM CHLORIDE 0.9 % IR SOLN
Status: DC | PRN
  Administered 2015-03-16: 1000 mL

## 2015-03-16 SURGICAL SUPPLY — 44 items
BAG DECANTER FOR FLEXI CONT (MISCELLANEOUS) IMPLANT
BAG SPEC THK2 15X12 ZIP CLS (MISCELLANEOUS)
BAG ZIPLOCK 12X15 (MISCELLANEOUS) IMPLANT
CAPT HIP TOTAL 2 ×1 IMPLANT
COVER PERINEAL POST (MISCELLANEOUS) ×2 IMPLANT
DRAPE C-ARM 42X120 X-RAY (DRAPES) ×2 IMPLANT
DRAPE STERI IOBAN 125X83 (DRAPES) ×2 IMPLANT
DRAPE U-SHAPE 47X51 STRL (DRAPES) ×6 IMPLANT
DRSG AQUACEL AG ADV 3.5X10 (GAUZE/BANDAGES/DRESSINGS) ×2 IMPLANT
DURAPREP 26ML APPLICATOR (WOUND CARE) ×2 IMPLANT
ELECT BLADE TIP CTD 4 INCH (ELECTRODE) ×2 IMPLANT
ELECT PENCIL ROCKER SW 15FT (MISCELLANEOUS) ×1 IMPLANT
ELECT REM PT RETURN 15FT ADLT (MISCELLANEOUS) ×1 IMPLANT
ELECT REM PT RETURN 9FT ADLT (ELECTROSURGICAL) ×2
ELECTRODE REM PT RTRN 9FT ADLT (ELECTROSURGICAL) ×1 IMPLANT
FACESHIELD WRAPAROUND (MASK) ×8 IMPLANT
FACESHIELD WRAPAROUND OR TEAM (MASK) ×4 IMPLANT
GLOVE BIOGEL PI IND STRL 7.5 (GLOVE) ×1 IMPLANT
GLOVE BIOGEL PI IND STRL 8.5 (GLOVE) ×1 IMPLANT
GLOVE BIOGEL PI INDICATOR 7.5 (GLOVE) ×1
GLOVE BIOGEL PI INDICATOR 8.5 (GLOVE) ×1
GLOVE ECLIPSE 8.0 STRL XLNG CF (GLOVE) ×4 IMPLANT
GLOVE ORTHO TXT STRL SZ7.5 (GLOVE) ×2 IMPLANT
GOWN SPEC L3 XXLG W/TWL (GOWN DISPOSABLE) ×2 IMPLANT
GOWN STRL REUS W/TWL LRG LVL3 (GOWN DISPOSABLE) ×2 IMPLANT
HOLDER FOLEY CATH W/STRAP (MISCELLANEOUS) ×2 IMPLANT
KIT BASIN OR (CUSTOM PROCEDURE TRAY) ×2 IMPLANT
LIQUID BAND (GAUZE/BANDAGES/DRESSINGS) ×2 IMPLANT
NDL SAFETY ECLIPSE 18X1.5 (NEEDLE) IMPLANT
NEEDLE HYPO 18GX1.5 SHARP (NEEDLE)
PACK TOTAL JOINT (CUSTOM PROCEDURE TRAY) ×2 IMPLANT
PEN SKIN MARKING BROAD (MISCELLANEOUS) ×2 IMPLANT
SAW OSC TIP CART 19.5X105X1.3 (SAW) ×2 IMPLANT
SUT MNCRL AB 4-0 PS2 18 (SUTURE) ×2 IMPLANT
SUT VIC AB 1 CT1 36 (SUTURE) ×6 IMPLANT
SUT VIC AB 2-0 CT1 27 (SUTURE) ×4
SUT VIC AB 2-0 CT1 TAPERPNT 27 (SUTURE) ×2 IMPLANT
SUT VLOC 180 0 24IN GS25 (SUTURE) ×2 IMPLANT
SYR 50ML LL SCALE MARK (SYRINGE) IMPLANT
TOWEL OR 17X26 10 PK STRL BLUE (TOWEL DISPOSABLE) ×2 IMPLANT
TOWEL OR NON WOVEN STRL DISP B (DISPOSABLE) IMPLANT
TRAY FOLEY W/METER SILVER 16FR (SET/KITS/TRAYS/PACK) ×2 IMPLANT
WATER STERILE IRR 1500ML POUR (IV SOLUTION) ×2 IMPLANT
YANKAUER SUCT BULB TIP 10FT TU (MISCELLANEOUS) ×2 IMPLANT

## 2015-03-16 NOTE — Transfer of Care (Signed)
Immediate Anesthesia Transfer of Care Note  Patient: Marvin Frost  Procedure(s) Performed: Procedure(s): RIGHT TOTAL HIP ARTHROPLASTY ANTERIOR APPROACH (Right)  Patient Location: PACU  Anesthesia Type:Spinal  Level of Consciousness: alert  and oriented  Airway & Oxygen Therapy: Patient connected to face mask oxygen  Post-op Assessment: Post -op Vital signs reviewed and stable  Post vital signs: stable  Last Vitals:  Filed Vitals:   03/16/15 0510  BP: 148/81  Pulse: 87  Temp: 36.4 C  Resp: 18    Complications: No apparent anesthesia complications

## 2015-03-16 NOTE — Anesthesia Procedure Notes (Signed)
Spinal Patient location during procedure: OR Staffing Anesthesiologist: DENENNY, BRUCE Performed by: anesthesiologist  Preanesthetic Checklist Completed: patient identified, site marked, surgical consent, pre-op evaluation, timeout performed, IV checked, risks and benefits discussed and monitors and equipment checked Spinal Block Patient position: sitting Prep: Betadine Patient monitoring: heart rate, continuous pulse ox and blood pressure Approach: midline Location: L3-4 Injection technique: single-shot Needle Needle type: Spinocan  Needle gauge: 22 G Needle length: 9 cm Additional Notes Expiration date of kit checked and confirmed. Patient tolerated procedure well, without complications. CSF clear. No paresthesias. Tolerated well.     

## 2015-03-16 NOTE — Progress Notes (Signed)
Utilization review completed.  

## 2015-03-16 NOTE — Interval H&P Note (Signed)
History and Physical Interval Note:  03/16/2015 6:42 AM  Marvin Frost  has presented today for surgery, with the diagnosis of RIGHT HIIP OA  The various methods of treatment have been discussed with the patient and family. After consideration of risks, benefits and other options for treatment, the patient has consented to  Procedure(s): RIGHT TOTAL HIP ARTHROPLASTY ANTERIOR APPROACH (Right) as a surgical intervention .  The patient's history has been reviewed, patient examined, no change in status, stable for surgery.  I have reviewed the patient's chart and labs.  Questions were answered to the patient's satisfaction.     Marvin Pole

## 2015-03-16 NOTE — Op Note (Signed)
NAME:  Marvin Frost                ACCOUNT NO.: [id number]      MEDICAL RECORD NO.: [id number]      FACILITY:  [facility]      PHYSICIAN:  Paralee Cancel D  DATE OF BIRTH:  05-28-1946     DATE OF PROCEDURE:  03/16/2015                                 OPERATIVE REPORT         PREOPERATIVE DIAGNOSIS: Right  hip osteoarthritis.      POSTOPERATIVE DIAGNOSIS:  Right hip osteoarthritis.      PROCEDURE:  Right total hip replacement through an anterior approach   utilizing DePuy THR system, component size 32mm pinnacle cup, a size 36+4 neutral   Altrex liner, a size 5 Hi Tri Lock stem with a 36+1.5 delta ceramic   ball.      SURGEON:  Pietro Cassis. Alvan Dame, M.D.      ASSISTANT:  Danae Orleans, PA-C     ANESTHESIA:  Spinal.      SPECIMENS:  None.      COMPLICATIONS:  None.      BLOOD LOSS:  300 cc     DRAINS:  None.      INDICATION OF THE PROCEDURE:  Marvin Frost is a 69 y.o. male who had   presented to office for evaluation of right hip pain.  Radiographs revealed   progressive degenerative changes with bone-on-bone   articulation to the  hip joint.  The patient had painful limited range of   motion significantly affecting their overall quality of life.  The patient was failing to    respond to conservative measures, and at this point was ready   to proceed with more definitive measures.  The patient has noted progressive   degenerative changes in his hip, progressive problems and dysfunction   with regarding the hip prior to surgery.  Consent was obtained for   benefit of pain relief.  Specific risk of infection, DVT, component   failure, dislocation, need for revision surgery, as well discussion of   the anterior versus posterior approach were reviewed.  Consent was   obtained for benefit of anterior pain relief through an anterior   approach.      PROCEDURE IN DETAIL:  The patient was brought to operative theater.   Once adequate anesthesia, preoperative  antibiotics, 2gm of Ancef, 1 gm of Tranexamic Acid, and 10 mg of Decadron administered.   The patient was positioned supine on the OSI Hanna table.  Once adequate   padding of boney process was carried out, we had predraped out the hip, and  used fluoroscopy to confirm orientation of the pelvis and position.      The right hip was then prepped and draped from proximal iliac crest to   mid thigh with shower curtain technique.      Time-out was performed identifying the patient, planned procedure, and   extremity.     An incision was then made 2 cm distal and lateral to the   anterior superior iliac spine extending over the orientation of the   tensor fascia lata muscle and sharp dissection was carried down to the   fascia of the muscle and protractor placed in the soft tissues.      The fascia was then incised.  The muscle belly was identified and swept   laterally and retractor placed along the superior neck.  Following   cauterization of the circumflex vessels and removing some pericapsular   fat, a second cobra retractor was placed on the inferior neck.  A third   retractor was placed on the anterior acetabulum after elevating the   anterior rectus.  A L-capsulotomy was along the line of the   superior neck to the trochanteric fossa, then extended proximally and   distally.  Tag sutures were placed and the retractors were then placed   intracapsular.  We then identified the trochanteric fossa and   orientation of my neck cut, confirmed this radiographically   and then made a neck osteotomy with the femur on traction.  The femoral   head was removed without difficulty or complication.  Traction was let   off and retractors were placed posterior and anterior around the   acetabulum.      The labrum and foveal tissue were debrided.  I began reaming with a 36mm   reamer and reamed up to 1mm reamer with good bony bed preparation and a 86mm   cup was chosen.  The final 27mm Pinnacle cup  was then impacted under fluoroscopy  to confirm the depth of penetration and orientation with respect to   abduction.  A screw was placed followed by the hole eliminator.  The final   36+4 neutral Altrex liner was impacted with good visualized rim fit.  The cup was positioned anatomically within the acetabular portion of the pelvis.      At this point, the femur was rolled at 80 degrees.  Further capsule was   released off the inferior aspect of the femoral neck.  I then   released the superior capsule proximally.  The hook was placed laterally   along the femur and elevated manually and held in position with the bed   hook.  The leg was then extended and adducted with the leg rolled to 100   degrees of external rotation.  Once the proximal femur was fully   exposed, I used a box osteotome to set orientation.  I then began   broaching with the starting chili pepper broach and passed this by hand and then broached up to 5.  With the 5 broach in place I chose a high offset neck and did several trial reductions.  The offset was appropriate, leg lengths   appeared to be equal, confirmed radiographically, best with +1.5 head ball.   Given these findings, I went ahead and dislocated the hip, repositioned all   retractors and positioned the right hip in the extended and abducted position.  The final 5 Hi Tri Lock stem was   chosen and it was impacted down to the level of neck cut.  Based on this   and the trial reduction, a 36+1.5 delta ceramic ball was chosen and   impacted onto a clean and dry trunnion, and the hip was reduced.  The   hip had been irrigated throughout the case again at this point.  I did   reapproximate the superior capsular leaflet to the anterior leaflet   using #1 Vicryl.  The fascia of the   tensor fascia lata muscle was then reapproximated using #1 Vicryl and #0 V-lock sutures.  The   remaining wound was closed with 2-0 Vicryl and running 4-0 Monocryl.   The hip was cleaned,  dried, and dressed sterilely using Dermabond and  Aquacel dressing.  He was then brought   to recovery room in stable condition tolerating the procedure well.    Danae Orleans, PA-C was present for the entirety of the case involved from   preoperative positioning, perioperative retractor management, general   facilitation of the case, as well as primary wound closure as assistant.            Pietro Cassis Alvan Dame, M.D.        03/16/2015 8:53 AM

## 2015-03-16 NOTE — Anesthesia Postprocedure Evaluation (Signed)
  Anesthesia Post-op Note  Patient: Marvin Frost  Procedure(s) Performed: Procedure(s) (LRB): RIGHT TOTAL HIP ARTHROPLASTY ANTERIOR APPROACH (Right)  Patient Location: PACU  Anesthesia Type: Spinal  Level of Consciousness: awake and alert   Airway and Oxygen Therapy: Patient Spontanous Breathing  Post-op Pain: mild  Post-op Assessment: Post-op Vital signs reviewed, Patient's Cardiovascular Status Stable, Respiratory Function Stable, Patent Airway and No signs of Nausea or vomiting  Last Vitals:  Filed Vitals:   03/16/15 1444  BP: 129/64  Pulse: 75  Temp: 36.6 C  Resp: 16    Post-op Vital Signs: stable   Complications: No apparent anesthesia complications

## 2015-03-16 NOTE — Evaluation (Signed)
Physical Therapy Evaluation Patient Details Name: Marvin Frost MRN: [id number] DOB: June 12, 1946 Today's Date: 03/16/2015   History of Present Illness  R DATHA  Clinical Impression  Patient  Felt dizzy and nausea after standing and taking  A few steps. Returned to the bed. BP 117/59. Patient will benefit from PT to address problems listed in note below.   Follow Up Recommendations Home health PT;Supervision/Assistance - 24 hour    Equipment Recommendations  Rolling walker with 5" wheels    Recommendations for Other Services       Precautions / Restrictions Precautions Precautions: Fall Precaution Comments: dizzy on eval      Mobility  Bed Mobility Overal bed mobility: Needs Assistance Bed Mobility: Supine to Sit;Sit to Supine     Supine to sit: Min assist Sit to supine: Mod assist   General bed mobility comments: assist with R leg off and onto bed.  Transfers Overall transfer level: Needs assistance Equipment used: Rolling walker (2 wheeled) Transfers: Sit to/from Stand Sit to Stand: Min assist         General transfer comment: cues for hand and R leg position  Ambulation/Gait Ambulation/Gait assistance: Min assist;+2 safety/equipment Ambulation Distance (Feet): 5 Feet (forward and back to bed.) Assistive device: Rolling walker (2 wheeled) Gait Pattern/deviations: Step-to pattern;Antalgic;Decreased step length - right     General Gait Details: patient c/o dizziness and nausea. returned to the bed. cues for sequence  Stairs            Wheelchair Mobility    Modified Rankin (Stroke Patients Only)       Balance                                             Pertinent Vitals/Pain Pain Assessment: 0-10 Pain Score: 5  Pain Location: R hip thigh Pain Descriptors / Indicators: Tightness;Burning Pain Intervention(s): Limited activity within patient's tolerance;Monitored during session;Premedicated before session;Repositioned;Ice applied     Home Living Family/patient expects to be discharged to:: Private residence Living Arrangements: Spouse/significant other Available Help at Discharge: Family Type of Home: House Home Access: Stairs to enter Entrance Stairs-Rails: None Entrance Stairs-Number of Steps: 2 Home Layout: Multi-level;Able to live on main level with bedroom/bathroom Home Equipment: None      Prior Function Level of Independence: Independent               Hand Dominance        Extremity/Trunk Assessment               Lower Extremity Assessment: RLE deficits/detail RLE Deficits / Details: able to bear weight     Cervical / Trunk Assessment: Normal  Communication   Communication: No difficulties  Cognition Arousal/Alertness: Awake/alert Behavior During Therapy: WFL for tasks assessed/performed Overall Cognitive Status: Within Functional Limits for tasks assessed                      General Comments      Exercises        Assessment/Plan    PT Assessment Patient needs continued PT services  PT Diagnosis Difficulty walking;Acute pain   PT Problem List Decreased strength;Decreased range of motion;Decreased activity tolerance;Decreased mobility;Decreased safety awareness;Decreased knowledge of precautions;Pain;Cardiopulmonary status limiting activity  PT Treatment Interventions DME instruction;Gait training;Stair training;Functional mobility training;Therapeutic activities;Therapeutic exercise;Patient/family education   PT Goals (Current goals can be found in the  Care Plan section) Acute Rehab PT Goals Patient Stated Goal: to go home PT Goal Formulation: With patient Time For Goal Achievement: 03/19/15 Potential to Achieve Goals: Good    Frequency 7X/week   Barriers to discharge        Co-evaluation               End of Session   Activity Tolerance: Treatment limited secondary to medical complications (Comment) (nausea and dizzy.) Patient left: in  bed;with call bell/phone within reach Nurse Communication: Mobility status, dizzy and nausea         Time: 5501-5868 PT Time Calculation (min) (ACUTE ONLY): 18 min   Charges:   PT Evaluation $Initial PT Evaluation Tier I: 1 Procedure     PT G CodesClaretha Cooper 03/16/2015, 5:37 PM

## 2015-03-17 LAB — BASIC METABOLIC PANEL
Anion gap: 10 (ref 5–15)
BUN: 16 mg/dL (ref 6–20)
CHLORIDE: 101 mmol/L (ref 101–111)
CO2: 23 mmol/L (ref 22–32)
CREATININE: 0.83 mg/dL (ref 0.61–1.24)
Calcium: 8.6 mg/dL — ABNORMAL LOW (ref 8.9–10.3)
GFR calc Af Amer: >60 mL/min (ref >60)
GFR calc non Af Amer: >60 mL/min (ref >60)
Glucose, Bld: 172 mg/dL — ABNORMAL HIGH (ref 65–99)
Potassium: 4.1 mmol/L (ref 3.5–5.1)
SODIUM: 134 mmol/L — AB (ref 135–145)

## 2015-03-17 LAB — GLUCOSE, CAPILLARY
GLUCOSE-CAPILLARY: 161 mg/dL — AB (ref 65–99)
Glucose-Capillary: 177 mg/dL — ABNORMAL HIGH (ref 65–99)

## 2015-03-17 LAB — CBC
HCT: 32.5 % — ABNORMAL LOW (ref 39.0–52.0)
HEMOGLOBIN: 10.1 g/dL — AB (ref 13.0–17.0)
MCH: 23.1 pg — AB (ref 26.0–34.0)
MCHC: 31.1 g/dL (ref 30.0–36.0)
MCV: 74.4 fL — ABNORMAL LOW (ref 78.0–100.0)
Platelets: 326 10*3/uL (ref 150–400)
RBC: 4.37 MIL/uL (ref 4.22–5.81)
RDW: 16.3 % — AB (ref 11.5–15.5)
WBC: 16.1 10*3/uL — ABNORMAL HIGH (ref 4.0–10.5)

## 2015-03-17 MED ORDER — HYDROCODONE-ACETAMINOPHEN 7.5-325 MG PO TABS: 1.0000 | ORAL_TABLET | ORAL | Status: DC | PRN

## 2015-03-17 MED ORDER — TIZANIDINE HCL 4 MG PO TABS: 4.0000 mg | ORAL_TABLET | Freq: Four times a day (QID) | ORAL | Status: DC | PRN

## 2015-03-17 MED ORDER — POLYETHYLENE GLYCOL 3350 17 G PO PACK: 17.0000 g | PACK | Freq: Two times a day (BID) | ORAL | Status: DC

## 2015-03-17 MED ORDER — ASPIRIN 325 MG PO TBEC: 325.0000 mg | DELAYED_RELEASE_TABLET | Freq: Two times a day (BID) | ORAL | Status: AC

## 2015-03-17 MED ORDER — FERROUS SULFATE 325 (65 FE) MG PO TABS: 325.0000 mg | ORAL_TABLET | Freq: Three times a day (TID) | ORAL | Status: DC

## 2015-03-17 MED ORDER — DOCUSATE SODIUM 100 MG PO CAPS: 100.0000 mg | ORAL_CAPSULE | Freq: Two times a day (BID) | ORAL | Status: DC

## 2015-03-17 NOTE — Care Management Note (Signed)
Case Management Note  Patient Details  Name: Marvin Frost MRN: [id number] Date of Birth: 1946/06/24  Subjective/Objective:                  RIGHT TOTAL HIP ARTHROPLASTY ANTERIOR APPROACH (Right)   Action/Plan:  Discharge planning Expected Discharge Date:  03/17/15              Expected Discharge Plan:  Home/Self Care  In-House Referral:     Discharge planning Services  CM Consult  Post Acute Care Choice:  NA Choice offered to:  NA  DME Arranged:  3-N-1, Walker rolling DME Agency:  [place]:    [facility] Agency:     Status of Service:  Completed, signed off  Medicare Important Message Given:    Date Medicare IM Given:    Medicare IM give by:    Date Additional Medicare IM Given:    Additional Medicare Important Message give by:     If discussed at [place] of Stay Meetings, dates discussed:    Additional Comments: CM called [facility] DME rep, Tiffany to please deliver the rolling walker and 3n1 to room.  Per pre-surgical arrangement, no HH services ordered.  No other CM needs were communicated. Dellie Catholic, RN 03/17/2015, 8:58 AM

## 2015-03-17 NOTE — Progress Notes (Signed)
RN reviewed discharge instructions with patient and family. All questions answered.   Paperwork and prescriptions given.   NT rolled patient and all belongings down to family car.

## 2015-03-17 NOTE — Progress Notes (Signed)
Physical Therapy Treatment Patient Details Name: Marvin Frost MRN: [id number] DOB: 01/17/1946 Today's Date: 03/17/2015    History of Present Illness R DATHA    PT Comments    Patient is feeling much better today, plans DC today after practicing stairs.  Follow Up Recommendations  Home health PT;Supervision/Assistance - 24 hour     Equipment Recommendations  Rolling walker with 5" wheels    Recommendations for Other Services       Precautions / Restrictions Precautions Precautions: Fall    Mobility  Bed Mobility   Bed Mobility: Supine to Sit     Supine to sit: Min guard     General bed mobility comments: used sheet to slide leg, cues for technique. has a leg lifter at home  Transfers Overall transfer level: Needs assistance Equipment used: Rolling walker (2 wheeled) Transfers: Sit to/from Stand Sit to Stand: Supervision         General transfer comment: cues for hand placement and R leg position  Ambulation/Gait Ambulation/Gait assistance: Min guard Ambulation Distance (Feet): 150 Feet Assistive device: Rolling walker (2 wheeled) Gait Pattern/deviations: Step-to pattern;Step-through pattern     General Gait Details: cues for sequence, smoothe gait, step through   Stairs            Wheelchair Mobility    Modified Rankin (Stroke Patients Only)       Balance Overall balance assessment: Needs assistance Sitting-balance support: Feet supported Sitting balance-Leahy Scale: Good     Standing balance support: Bilateral upper extremity supported;During functional activity Standing balance-Leahy Scale: Poor Standing balance comment: rw for balance                    Cognition Arousal/Alertness: Awake/alert Behavior During Therapy: WFL for tasks assessed/performed Overall Cognitive Status: Within Functional Limits for tasks assessed                      Exercises Total Joint Exercises Ankle Circles/Pumps: Supine Quad Sets:  Supine Short Arc Quad: Supine Heel Slides: Supine Hip ABduction/ADduction: Supine Long Arc Quad: AROM;Right;5 reps;Seated Marching in Standing: AAROM;Right;5 reps;Seated    General Comments        Pertinent Vitals/Pain Pain Assessment: 0-10 Pain Score: 2  Pain Location: R thigh Pain Descriptors / Indicators: Burning;Sore Pain Intervention(s): Premedicated before session;Ice applied;Repositioned    Home Living Family/patient expects to be discharged to:: Private residence Living Arrangements: Spouse/significant other Available Help at Discharge: Family Type of Home: House Home Access: Stairs to enter Entrance Stairs-Rails: None Home Layout: Multi-level;Able to live on main level with bedroom/bathroom Home Equipment: None      Prior Function Level of Independence: Independent          PT Goals (current goals can now be found in the care plan section) Acute Rehab PT Goals Patient Stated Goal: to go home Progress towards PT goals: Progressing toward goals    Frequency  7X/week    PT Plan Current plan remains appropriate    Co-evaluation             End of Session   Activity Tolerance: Patient tolerated treatment well Patient left: in chair;with call bell/phone within reach;with family/visitor present     Time: 0829-0909 PT Time Calculation (min) (ACUTE ONLY): 40 min  Charges:  $Gait Training: 8-22 mins $Therapeutic Exercise: 8-22 mins $Self Care/Home Management: 8-22                    G Codes:  Claretha Cooper 03/17/2015, 10:34 AM

## 2015-03-17 NOTE — Progress Notes (Signed)
Physical Therapy Treatment Patient Details Name: Marvin Frost MRN: [id number] DOB: Nov 09, 1945 Today's Date: 03/17/2015    History of Present Illness R DATHA    PT Comments    Patient is progressing well. Happy for no pain. Ready for DC.  Follow Up Recommendations  Home health PT;Supervision/Assistance - 24 hour     Equipment Recommendations  Rolling walker with 5" wheels    Recommendations for Other Services       Precautions / Restrictions Precautions Precautions: Fall    Mobility  Bed Mobility   Bed Mobility: Supine to Sit     Supine to sit: Modified independent (Device/Increase time)     General bed mobility comments: used sheet to slide leg, cues for technique. has a leg lifter at home  Transfers   Equipment used: Rolling walker (2 wheeled) Transfers: Sit to/from Stand Sit to Stand: Supervision         General transfer comment: cues for hand placement and R leg position  Ambulation/Gait Ambulation/Gait assistance: Supervision Ambulation Distance (Feet): 50 Feet Assistive device: Rolling walker (2 wheeled) Gait Pattern/deviations: Step-through pattern     General Gait Details: cues for sequence, smoothe gait, step through   Stairs Stairs: Yes Stairs assistance: Min assist Stair Management: No rails;Step to pattern;Backwards;With walker Number of Stairs: 2 General stair comments: wife present for instruction.  Wheelchair Mobility    Modified Rankin (Stroke Patients Only)       Balance                                    Cognition Arousal/Alertness: Awake/alert                          Exercises Total Joint Exercises Ankle Circles/Pumps: Supine Quad Sets: Supine Short Arc Quad: Supine Heel Slides: Supine Hip ABduction/ADduction: Supine Long Arc Quad: AROM;Right;5 reps;Seated Marching in Standing: AROM;Right;10 reps Standing Hip Extension: AROM;Right;10 reps    General Comments        Pertinent  Vitals/Pain Pain Score: 1  Pain Location: R thigh Pain Descriptors / Indicators: Sore Pain Intervention(s): Monitored during session;Premedicated before session    Home Living                      Prior Function            PT Goals (current goals can now be found in the care plan section) Progress towards PT goals: Progressing toward goals    Frequency  7X/week    PT Plan Current plan remains appropriate    Co-evaluation             End of Session   Activity Tolerance: Patient tolerated treatment well Patient left: in bed;with family/visitor present     Time: 1218-1232 PT Time Calculation (min) (ACUTE ONLY): 14 min  Charges:  $Gait Training: 8-22 mins $Therapeutic Exercise: 8-22 mins $Self Care/Home Management: 2023/02/20                    G Codes:      Claretha Cooper 03/17/2015, 1:03 PM

## 2015-03-17 NOTE — Discharge Instructions (Signed)

## 2015-03-17 NOTE — Progress Notes (Signed)
Occupational Therapy Evaluation Patient Details Name: Marvin Frost MRN: [id number] DOB: March 18, 1946 Today's Date: 03/17/2015    History of Present Illness R DATHA   Clinical Impression   Pt admitted with the above diagnoses. PTA pt was independent with ADLs. Pt is currently min guard for toilet/shower transfers and functional mobility and min A for LB ADLs. Spouse plans to assist at home. ADL education provided to pt and spouse including AE and compensatory strategies. Pt plans to d/c today. No further acute OT needs indicated. OT signing off.      Follow Up Recommendations  Supervision - Intermittent (OOB/mobility)    Equipment Recommendations  3 in 1 bedside comode    Recommendations for Other Services       Precautions / Restrictions Precautions Precautions: Fall      Mobility Bed Mobility               General bed mobility comments: in recliner  Transfers Overall transfer level: Needs assistance Equipment used: Rolling walker (2 wheeled) Transfers: Sit to/from Stand Sit to Stand: Min guard         General transfer comment: from recliner and 3n1; cues for technique with rw    Balance Overall balance assessment: Needs assistance Sitting-balance support: Feet supported Sitting balance-Leahy Scale: Good     Standing balance support: Bilateral upper extremity supported;During functional activity Standing balance-Leahy Scale: Poor Standing balance comment: rw for balance                            ADL Overall ADL's : Needs assistance/impaired Eating/Feeding: Set up;Sitting   Grooming: Min guard;Standing   Upper Body Bathing: Set up;Sitting   Lower Body Bathing: Minimal assistance;Sit to/from stand Lower Body Bathing Details (indicate cue type and reason): unable to access right foot in sitting position, spouse plans to help, educated on AE Upper Body Dressing : Set up   Lower Body Dressing: Minimal assistance;Sit to/from stand Lower  Body Dressing Details (indicate cue type and reason): unable to access right foot in sitting position, spouse plans to help, educated on AE Toilet Transfer: Min guard;Ambulation;BSC;RW   Toileting- Clothing Manipulation and Hygiene: Min guard;Sitting/lateral lean;Sit to/from stand   Tub/ Shower Transfer: Min guard;Ambulation;3 in 1;Rolling walker   Functional mobility during ADLs: Min guard;Rolling walker General ADL Comments: Educated pt and spouse on AE and compensatory techniques for ADLs. Pt completed toilet/shower transfer to 3n1 as detailed above. Discussed safety with home setup.     Vision     Perception     Praxis      Pertinent Vitals/Pain Pain Assessment: 0-10 Pain Score: 2  Pain Location: R hip thigh Pain Descriptors / Indicators: Burning;Tightness Pain Intervention(s): Monitored during session;Repositioned;Limited activity within patient's tolerance     Hand Dominance     Extremity/Trunk Assessment Upper Extremity Assessment Upper Extremity Assessment: Overall WFL for tasks assessed   Lower Extremity Assessment Lower Extremity Assessment: Defer to PT evaluation   Cervical / Trunk Assessment Cervical / Trunk Assessment: Normal   Communication Communication Communication: No difficulties   Cognition Arousal/Alertness: Awake/alert Behavior During Therapy: WFL for tasks assessed/performed Overall Cognitive Status: Within Functional Limits for tasks assessed                     General Comments       Exercises       Shoulder Instructions      Home Living Family/patient expects to be discharged to::  Private residence Living Arrangements: Spouse/significant other Available Help at Discharge: Family Type of Home: House Home Access: Stairs to enter Technical brewer of Steps: 2 Entrance Stairs-Rails: None Home Layout: Multi-level;Able to live on main level with bedroom/bathroom     Bathroom Shower/Tub: Radiographer, therapeutic: Handicapped height Bathroom Accessibility: Yes How Accessible: Accessible via walker Home Equipment: None          Prior Functioning/Environment Level of Independence: Independent             OT Diagnosis:     OT Problem List:     OT Treatment/Interventions:      OT Goals(Current goals can be found in the care plan section) Acute Rehab OT Goals Patient Stated Goal: to go home  OT Frequency:     Barriers to D/C:            Co-evaluation              End of Session Equipment Utilized During Treatment: Rolling walker  Activity Tolerance: Patient tolerated treatment well Patient left: in chair;with call bell/phone within reach;with family/visitor present   Time: 0920-0936 OT Time Calculation (min): 16 min Charges:  OT General Charges $OT Visit: 1 Procedure OT Evaluation $Initial OT Evaluation Tier I: 1 Procedure G-Codes:    Hortencia Pilar 03/22/15, 9:47 AM

## 2015-03-17 NOTE — Progress Notes (Signed)
     Subjective: 1 Day Post-Op Procedure(s) (LRB): RIGHT TOTAL HIP ARTHROPLASTY ANTERIOR APPROACH (Right)   Patient reports pain as mild, pain controlled. No events throughout the night.  Ready to be discharged home.  Objective:   VITALS:   Filed Vitals:   03/17/15 0638  BP: 146/62  Pulse: 90  Temp: 97.7 F (36.5 C)  Resp: 16    Dorsiflexion/Plantar flexion intact Incision: dressing C/D/I No cellulitis present Compartment soft  LABS  Recent Labs  03/17/15 0432  HGB 10.1*  HCT 32.5*  WBC 16.1*  PLT 326     Recent Labs  03/17/15 0432  NA 134*  K 4.1  BUN 16  CREATININE 0.83  GLUCOSE 172*     Assessment/Plan: 1 Day Post-Op Procedure(s) (LRB): RIGHT TOTAL HIP ARTHROPLASTY ANTERIOR APPROACH (Right) Advance diet Up with therapy D/C IV fluids Discharge home with home health Follow up in 2 weeks at [facility]. Follow up with OLIN,Atilla Zollner D in 2 weeks.  Contact information:  [facility] [address], [place] [phone]    Obese (BMI 30-39.9) Estimated body mass index is 33.46 kg/(m^2) as calculated from the following:   Height as of this encounter: 5\' 8"  (1.727 m).   Weight as of this encounter: 99.791 kg (220 lb). Patient also counseled that weight may inhibit the healing process Patient counseled that losing weight will help with future health issues        West Pugh. Jaye Saal   PAC  03/17/2015, 7:55 AM

## 2015-03-25 ENCOUNTER — Ambulatory Visit: Payer: Medicare Other | Admitting: Pharmacist

## 2015-03-31 DIAGNOSIS — Z471 Aftercare following joint replacement surgery: Secondary | ICD-10-CM | POA: Diagnosis not present

## 2015-03-31 DIAGNOSIS — Z96641 Presence of right artificial hip joint: Secondary | ICD-10-CM | POA: Diagnosis not present

## 2015-03-31 NOTE — Discharge Summary (Signed)
Physician Discharge Summary  Patient ID: Marvin Frost MRN: [id number] DOB/AGE: [birth year] 71 y.o.  Admit date: 03/16/2015 Discharge date: 03/17/2015   Procedures:  Procedure(s) (LRB): RIGHT TOTAL HIP ARTHROPLASTY ANTERIOR APPROACH (Right)  Attending Physician:  Dr. Paralee Cancel   Admission Diagnoses:   Right hip primary OA / pain  Discharge Diagnoses:  Principal Problem:   S/P right THA, AA  Past Medical History  Diagnosis Date  . CAD (coronary artery disease)     Nonobstructive Cath 2004  . Hypertension   . Diabetes mellitus   . Hyperlipemia   . GERD (gastroesophageal reflux disease)   . Obesity   . Pancreatitis   . Cataract   . Heart palpitations     last episode- 09/2014 seen by Dr Percival Spanish 5/16- clearance for surgery   . Arthritis   . Cancer     hx of tumores removed from face     HPI:    Marvin Frost, 69 y.o. male, has a history of pain and functional disability in the right hip(s) due to arthritis and patient has failed non-surgical conservative treatments for greater than 12 weeks to include NSAID's and/or analgesics, corticosteriod injections, use of assistive devices and activity modification. Onset of symptoms was gradual starting 2+ years ago with gradually worsening course since that time.The patient noted no past surgery on the right hip(s). Patient currently rates pain in the right hip at 8 out of 10 with activity. Patient has night pain, worsening of pain with activity and weight bearing, trendelenberg gait, pain that interfers with activities of daily living and pain with passive range of motion. Patient has evidence of periarticular osteophytes and joint space narrowing by imaging studies. This condition presents safety issues increasing the risk of falls. There is no current active infection. Risks, benefits and expectations were discussed with the patient. Risks including but not limited to the risk of anesthesia, blood clots, nerve damage, blood vessel  damage, failure of the prosthesis, infection and up to and including death. Patient understand the risks, benefits and expectations and wishes to proceed with surgery.   PCP: Redge Gainer, MD   Discharged Condition: good  Hospital Course:  Patient underwent the above stated procedure on 03/16/2015. Patient tolerated the procedure well and brought to the recovery room in good condition and subsequently to the floor.  POD #1 BP: 146/62 ; Pulse: 90 ; Temp: 97.7 F (36.5 C) ; Resp: 16 Patient reports pain as mild, pain controlled. No events throughout the night. Ready to be discharged home. Dorsiflexion/plantar flexion intact, incision: dressing C/D/I, no cellulitis present and compartment soft.   LABS  Basename    HGB  10.1  HCT  32.5    Discharge Exam: General appearance: alert, cooperative and no distress Extremities: Homans sign is negative, no sign of DVT, no edema, redness or tenderness in the calves or thighs and no ulcers, gangrene or trophic changes  Disposition: Home with follow up in 2 weeks   Follow-up Information    Follow up with Mauri Pole, MD. Schedule an appointment as soon as possible for a visit in 2 weeks.   Specialty:  Orthopedic Surgery   Contact information:   [address] [place] [address] [phone]       Follow up with [facility].   Why:  rolling walker and 3n1 (over the commode seat)   Contact information:   [address] [place] [address] [phone]  Discharge Instructions    Call MD / Call 911    Complete by:  As directed   If you experience chest pain or shortness of breath, CALL 911 and be transported to the hospital emergency room.  If you develope a fever above 101 F, pus (white drainage) or increased drainage or redness at the wound, or calf pain, call your surgeon's office.     Change dressing    Complete by:  As directed   Maintain surgical dressing until follow up in  the clinic. If the edges start to pull up, may reinforce with tape. If the dressing is no longer working, may remove and cover with gauze and tape, but must keep the area dry and clean.  Call with any questions or concerns.     Constipation Prevention    Complete by:  As directed   Drink plenty of fluids.  Prune juice may be helpful.  You may use a stool softener, such as Colace (over the counter) 100 mg twice a day.  Use MiraLax (over the counter) for constipation as needed.     Diet - low sodium heart healthy    Complete by:  As directed      Discharge instructions    Complete by:  As directed   Maintain surgical dressing until follow up in the clinic. If the edges start to pull up, may reinforce with tape. If the dressing is no longer working, may remove and cover with gauze and tape, but must keep the area dry and clean.  Follow up in 2 weeks at [facility]. Call with any questions or concerns.     Increase activity slowly as tolerated    Complete by:  As directed   Weight bearing as tolerated with assist device (walker, cane, etc) as directed, use it as long as suggested by your surgeon or therapist, typically at least 4-6 weeks.     TED hose    Complete by:  As directed   Use stockings (TED hose) for 2 weeks on both leg(s).  You may remove them at night for sleeping.             Medication List    STOP taking these medications        aspirin 81 MG tablet  Replaced by:  aspirin 325 MG EC tablet     traMADol 50 MG tablet  Commonly known as:  ULTRAM      TAKE these medications        ALIGN 4 MG Caps  Take 1 capsule by mouth every morning.     aspirin 325 MG EC tablet  Take 1 tablet (325 mg total) by mouth 2 (two) times daily.     CRESTOR 10 MG tablet  Generic drug:  rosuvastatin  Take 10 mg by mouth See admin instructions. Every 2- 2 day s     docusate sodium 100 MG capsule  Commonly known as:  COLACE  Take 1 capsule (100 mg total) by mouth 2 (two) times  daily.     Empagliflozin-Metformin HCl 12.10-998 MG Tabs  Commonly known as:  SYNJARDY  Take 1 tablet by mouth 2 (two) times daily.     Evolocumab 140 MG/ML Soaj  Commonly known as:  REPATHA SURECLICK  Inject 382 mg into the skin as directed. Inject subcutaneously every two weeks     ferrous sulfate 325 (65 FE) MG tablet  Take 1 tablet (325 mg total) by mouth 3 (three) times daily  after meals.     freestyle lancets  Check BS BID and PRN. DX.E11.9     FREESTYLE LITE Devi  Use to check BS bid. DX. E11.9     glucose blood test strip  Commonly known as:  FREESTYLE LITE  Check BS BID and PRN. DX. E11.9     HYDROcodone-acetaminophen 7.5-325 MG tablet  Commonly known as:  NORCO  Take 1-2 tablets by mouth every 4 (four) hours as needed for moderate pain.     Insulin Pen Needle 32G X 5 MM Misc  Commonly known as:  NOVOTWIST  Use with insulin pens up to 5 times per day.dx.250.0     multivitamin capsule  Take 1 capsule by mouth daily. Patient takes in the pm     NOVOLOG FLEXPEN 100 UNIT/ML FlexPen  Generic drug:  insulin aspart  Inject 12 Units into the skin 2 (two) times daily. 150-170-6 units if higher than 170- patient takes 10 units     olmesartan-hydrochlorothiazide 40-25 MG tablet  Commonly known as:  BENICAR HCT  Take 1 tablet by mouth daily.     pantoprazole 40 MG tablet  Commonly known as:  PROTONIX  Take 1 tablet (40 mg total) by mouth 2 (two) times daily.     polyethylene glycol packet  Commonly known as:  MIRALAX / GLYCOLAX  Take 17 g by mouth 2 (two) times daily.     tiZANidine 4 MG tablet  Commonly known as:  ZANAFLEX  Take 1 tablet (4 mg total) by mouth every 6 (six) hours as needed for muscle spasms.     TRESIBA FLEXTOUCH 200 UNIT/ML Sopn  Generic drug:  Insulin Degludec  Inject 60 Units into the skin daily. Patient takes in the pm- depending on blood glucose readings patient takes anywhere between 50-60 units if 190- blood glucose patient will do 50 units  if around 220 blood glucose patient will take 60 units     Vitamin D3 5000 UNITS Tabs  Take 1 tablet by mouth daily.         Signed: West Pugh. Babish   PA-C  03/31/2015, 9:14 AM

## 2015-04-12 ENCOUNTER — Other Ambulatory Visit: Payer: Self-pay | Admitting: Family Medicine

## 2015-04-13 NOTE — Telephone Encounter (Signed)
Last filled 02/22/15, last seen 01/26/15. Rx will print

## 2015-04-15 ENCOUNTER — Encounter: Payer: Self-pay | Admitting: Pharmacist

## 2015-04-15 ENCOUNTER — Telehealth: Payer: Self-pay | Admitting: Pharmacist

## 2015-04-15 ENCOUNTER — Ambulatory Visit (INDEPENDENT_AMBULATORY_CARE_PROVIDER_SITE_OTHER): Payer: Medicare Other | Admitting: Pharmacist

## 2015-04-15 VITALS — BP 138/76 | HR 82 | Ht 68.0 in | Wt 218.0 lb

## 2015-04-15 DIAGNOSIS — D649 Anemia, unspecified: Secondary | ICD-10-CM | POA: Diagnosis not present

## 2015-04-15 DIAGNOSIS — E1169 Type 2 diabetes mellitus with other specified complication: Secondary | ICD-10-CM | POA: Diagnosis not present

## 2015-04-15 DIAGNOSIS — E1159 Type 2 diabetes mellitus with other circulatory complications: Secondary | ICD-10-CM

## 2015-04-15 DIAGNOSIS — Z8679 Personal history of other diseases of the circulatory system: Secondary | ICD-10-CM | POA: Diagnosis not present

## 2015-04-15 DIAGNOSIS — E782 Mixed hyperlipidemia: Secondary | ICD-10-CM | POA: Diagnosis not present

## 2015-04-15 DIAGNOSIS — Z23 Encounter for immunization: Secondary | ICD-10-CM

## 2015-04-15 MED ORDER — PANTOPRAZOLE SODIUM 40 MG PO TBEC: 40.0000 mg | DELAYED_RELEASE_TABLET | Freq: Two times a day (BID) | ORAL | Status: DC

## 2015-04-15 MED ORDER — EVOLOCUMAB 140 MG/ML ~~LOC~~ SOAJ: 140.0000 mg | SUBCUTANEOUS | Status: DC

## 2015-04-15 MED ORDER — FLUCONAZOLE 150 MG PO TABS: 150.0000 mg | ORAL_TABLET | Freq: Once | ORAL | Status: DC

## 2015-04-15 NOTE — Progress Notes (Signed)
Patient ID: Marvin Frost, male   DOB: October 11, 1945, 69 y.o.   MRN: [id number]    Subjective:   Marvin Frost is a 69 y.o. male who presents for follow up diabetes and hyperlipidemia. He has recently had hip replacement and is getting along well with recovery.  Patient continues to tolerate Repatha.  He is also taking Crestor 10mg  three times per week  Patient's exercise is limited due to hip pain   HBG readings range from 66 to 206.  Patient's last A1c was 7.8% (02/25/2015) HBG readings:  Am - 78 - 163  Lunch - 117-171  Supper - [id number]  Betdtime - [id number]  He is taking Synjardy BID, Tresiba 60 units qhs, Novolog 12 units 2 to 3 times daily before meals.  Tyler Aas was started at our last visit and patient has had amazing results.  We has started at 106 units daily but he was able to reduce dose to 60 units daily.  Current Medications (verified) Outpatient Encounter Prescriptions as of 04/15/2015  Medication Sig  . Blood Glucose Monitoring Suppl (FREESTYLE LITE) DEVI Use to check BS bid. DX. E11.9 (Patient not taking: Reported on 02/25/2015)  . Cholecalciferol (VITAMIN D3) 5000 UNITS TABS Take 1 tablet by mouth daily.   Marland Kitchen docusate sodium (COLACE) 100 MG capsule Take 1 capsule (100 mg total) by mouth 2 (two) times daily.  . Empagliflozin-Metformin HCl (SYNJARDY) 12.10-998 MG TABS Take 1 tablet by mouth 2 (two) times daily.  . Evolocumab (REPATHA SURECLICK) 283 MG/ML SOAJ Inject 140 mg into the skin as directed. Inject subcutaneously every two weeks (Patient taking differently: Inject 140 mg into the skin as directed. Inject subcutaneously every two weeks last dose- will be 03/11/2015)  . ferrous sulfate 325 (65 FE) MG tablet Take 1 tablet (325 mg total) by mouth 3 (three) times daily after meals.  Marland Kitchen glucose blood (FREESTYLE LITE) test strip Check BS BID and PRN. DX. E11.9 (Patient not taking: Reported on 02/25/2015)  . HYDROcodone-acetaminophen (NORCO) 7.5-325 MG per tablet Take 1-2 tablets by mouth  every 4 (four) hours as needed for moderate pain.  Marland Kitchen insulin aspart (NOVOLOG FLEXPEN) 100 UNIT/ML FlexPen Inject 12 Units into the skin 2 (two) times daily. 150-170-6 units if higher than 170- patient takes 10 units  . Insulin Degludec (TRESIBA FLEXTOUCH) 200 UNIT/ML SOPN Inject 60 Units into the skin daily. Patient takes in the pm- depending on blood glucose readings patient takes anywhere between 50-60 units if 190- blood glucose patient will do 50 units if around 220 blood glucose patient will take 60 units  . Insulin Pen Needle (NOVOTWIST) 32G X 5 MM MISC Use with insulin pens up to 5 times per day.dx.250.0 (Patient not taking: Reported on 02/25/2015)  . Lancets (FREESTYLE) lancets Check BS BID and PRN. DX.E11.9 (Patient not taking: Reported on 02/25/2015)  . Multiple Vitamin (MULTIVITAMIN) capsule Take 1 capsule by mouth daily. Patient takes in the pm  . olmesartan-hydrochlorothiazide (BENICAR HCT) 40-25 MG per tablet Take 1 tablet by mouth daily. (Patient taking differently: Take 1 tablet by mouth every morning. )  . pantoprazole (PROTONIX) 40 MG tablet Take 1 tablet (40 mg total) by mouth 2 (two) times daily.  . polyethylene glycol (MIRALAX / GLYCOLAX) packet Take 17 g by mouth 2 (two) times daily.  . Probiotic Product (ALIGN) 4 MG CAPS Take 1 capsule by mouth every morning.  . rosuvastatin (CRESTOR) 10 MG tablet Take 10 mg by mouth See admin instructions. Every 2- 2 day s  .  tiZANidine (ZANAFLEX) 4 MG tablet Take 1 tablet (4 mg total) by mouth every 6 (six) hours as needed for muscle spasms.  . traMADol (ULTRAM) 50 MG tablet TAKE 1 TABLET BY MOUTH EVERY TWELVE HOURS AS NEEDED   No facility-administered encounter medications on file as of 04/15/2015.    Allergies (verified) Baxinets; Celebrex; Clarithromycin; Liraglutide; Statins; Toprol xl; Triplex ad; Welchol; and Norvasc   History: Past Medical History  Diagnosis Date  . CAD (coronary artery disease)     Nonobstructive Cath 2004  .  Hypertension   . Diabetes mellitus   . Hyperlipemia   . GERD (gastroesophageal reflux disease)   . Obesity   . Pancreatitis   . Cataract   . Heart palpitations     last episode- 09/2014 seen by Dr Percival Spanish 5/16- clearance for surgery   . Arthritis   . Cancer     hx of tumores removed from face    Past Surgical History  Procedure Laterality Date  . Right shoulder surgery    . Hydrocele excision / repair    . Colonoscopy  11/2003    diverticulosis, no polyps  . Upper gastrointestinal endoscopy  08/19/2010    hiatal hernia 2cm, mild gastritis  . Cardiac catheterization      2004 and 1999- done by Dr Percival Spanish   . Left calf surgery       related to trauma of being torn   . Left index finger arthrioscopic surgery     . Lymph node removed from right neck     . Total hip arthroplasty Right 03/16/2015    Procedure: RIGHT TOTAL HIP ARTHROPLASTY ANTERIOR APPROACH;  Surgeon: Paralee Cancel, MD;  Location: [facility];  Service: Orthopedics;  Laterality: Right;   Family History  Problem Relation Age of Onset  . Heart disease Mother   . Diabetes Brother   . Heart disease Brother   . Heart attack Brother 30  . Heart attack Father 58   Social History   Occupational History  . 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 Smoking status: Former Smoker    Types: Cigarettes    Quit date: 02/05/1984  . Smokeless tobacco: Former Systems developer    Types: Chew  . Alcohol Use: No  . Drug Use: No  . Sexual Activity: Yes      Objective:    There were no vitals filed for this visit. There is no weight on file to calculate BMI.   Assessment:    Type 2 diabetes - improved control but BG later in day still elevated Hyperlipidemia - improved greatly with Repatha HTN - at goal today     Plan:   1.  Continue Tresiba u-200 - Inject 60 units daily.  2.  Continue Synjardy 2.5/1000mg  1 tablet bid 3.  Increase Novolog 12 units with breakfast, 14 units with lunch and 14 units with  supper.    3.  Continue Repatha and crestor 4.   Influenza vaccine given in office today 5.  Follow up with Dr Laurance Flatten in February and clinical pharmacist in May.  Patient is to have labs drawn 05/27/2015   Cherre Robins, [place], CDE, CPP   04/15/2015

## 2015-04-15 NOTE — Telephone Encounter (Signed)
Is okay to give him Diflucan 150 #2 take one stat and repeat one in 1 week later

## 2015-04-15 NOTE — Telephone Encounter (Signed)
Pt aware.

## 2015-04-26 ENCOUNTER — Ambulatory Visit (INDEPENDENT_AMBULATORY_CARE_PROVIDER_SITE_OTHER): Payer: Medicare Other | Admitting: Ophthalmology

## 2015-04-26 DIAGNOSIS — H43813 Vitreous degeneration, bilateral: Secondary | ICD-10-CM | POA: Diagnosis not present

## 2015-04-26 DIAGNOSIS — I10 Essential (primary) hypertension: Secondary | ICD-10-CM | POA: Diagnosis not present

## 2015-04-26 DIAGNOSIS — E11319 Type 2 diabetes mellitus with unspecified diabetic retinopathy without macular edema: Secondary | ICD-10-CM

## 2015-04-26 DIAGNOSIS — H35033 Hypertensive retinopathy, bilateral: Secondary | ICD-10-CM | POA: Diagnosis not present

## 2015-04-26 DIAGNOSIS — E113293 Type 2 diabetes mellitus with mild nonproliferative diabetic retinopathy without macular edema, bilateral: Secondary | ICD-10-CM

## 2015-04-26 DIAGNOSIS — H353111 Nonexudative age-related macular degeneration, right eye, early dry stage: Secondary | ICD-10-CM | POA: Diagnosis not present

## 2015-04-26 LAB — HM DIABETES EYE EXAM

## 2015-04-28 DIAGNOSIS — Z471 Aftercare following joint replacement surgery: Secondary | ICD-10-CM | POA: Diagnosis not present

## 2015-04-28 DIAGNOSIS — Z96641 Presence of right artificial hip joint: Secondary | ICD-10-CM | POA: Diagnosis not present

## 2015-05-11 ENCOUNTER — Telehealth: Payer: Self-pay | Admitting: Family Medicine

## 2015-05-27 ENCOUNTER — Other Ambulatory Visit (INDEPENDENT_AMBULATORY_CARE_PROVIDER_SITE_OTHER): Payer: Medicare Other

## 2015-05-27 DIAGNOSIS — E782 Mixed hyperlipidemia: Secondary | ICD-10-CM

## 2015-05-27 DIAGNOSIS — E1159 Type 2 diabetes mellitus with other circulatory complications: Secondary | ICD-10-CM | POA: Diagnosis not present

## 2015-05-27 DIAGNOSIS — E1169 Type 2 diabetes mellitus with other specified complication: Secondary | ICD-10-CM

## 2015-05-27 DIAGNOSIS — D649 Anemia, unspecified: Secondary | ICD-10-CM

## 2015-05-27 LAB — POCT GLYCOSYLATED HEMOGLOBIN (HGB A1C): HEMOGLOBIN A1C: 7.6

## 2015-05-27 NOTE — Progress Notes (Signed)
Lab only 

## 2015-05-28 ENCOUNTER — Encounter: Payer: Self-pay | Admitting: Pharmacist

## 2015-05-28 LAB — CMP14+EGFR
ALBUMIN: 4.2 g/dL (ref 3.5–4.8)
ALK PHOS: 68 IU/L (ref 39–117)
ALT: 12 IU/L (ref 0–44)
AST: 11 IU/L (ref 0–40)
Albumin/Globulin Ratio: 1.8 (ref 1.1–2.5)
BILIRUBIN TOTAL: 0.3 mg/dL (ref 0.0–1.2)
BUN / CREAT RATIO: 17 (ref 10–22)
BUN: 14 mg/dL (ref 8–27)
CHLORIDE: 100 mmol/L (ref 97–106)
CO2: 24 mmol/L (ref 18–29)
Calcium: 9.1 mg/dL (ref 8.6–10.2)
Creatinine, Ser: 0.81 mg/dL (ref 0.76–1.27)
GFR calc Af Amer: 103 mL/min/{1.73_m2} (ref >59)
GFR calc non Af Amer: 89 mL/min/{1.73_m2} (ref >59)
GLUCOSE: 148 mg/dL — AB (ref 65–99)
Globulin, Total: 2.4 g/dL (ref 1.5–4.5)
Potassium: 4.4 mmol/L (ref 3.5–5.2)
SODIUM: 141 mmol/L (ref 136–144)
Total Protein: 6.6 g/dL (ref 6.0–8.5)

## 2015-05-28 LAB — CBC WITH DIFFERENTIAL/PLATELET
BASOS ABS: 0.2 10*3/uL (ref 0.0–0.2)
Basos: 2 %
EOS (ABSOLUTE): 0.8 10*3/uL — ABNORMAL HIGH (ref 0.0–0.4)
EOS: 8 %
HEMATOCRIT: 36.2 % — AB (ref 37.5–51.0)
HEMOGLOBIN: 10.7 g/dL — AB (ref 12.6–17.7)
IMMATURE GRANS (ABS): 0 10*3/uL (ref 0.0–0.1)
IMMATURE GRANULOCYTES: 0 %
LYMPHS: 24 %
Lymphocytes Absolute: 2.2 10*3/uL (ref 0.7–3.1)
MCH: 21.8 pg — ABNORMAL LOW (ref 26.6–33.0)
MCHC: 29.6 g/dL — ABNORMAL LOW (ref 31.5–35.7)
MCV: 74 fL — ABNORMAL LOW (ref 79–97)
MONOCYTES: 7 %
Monocytes Absolute: 0.7 10*3/uL (ref 0.1–0.9)
Neutrophils Absolute: 5.5 10*3/uL (ref 1.4–7.0)
Neutrophils: 59 %
PLATELETS: 443 10*3/uL — AB (ref 150–379)
RBC: 4.9 x10E6/uL (ref 4.14–5.80)
RDW: 18.2 % — AB (ref 12.3–15.4)
WBC: 9.3 10*3/uL (ref 3.4–10.8)

## 2015-05-28 LAB — LIPID PANEL
CHOLESTEROL TOTAL: 110 mg/dL (ref 100–199)
Chol/HDL Ratio: 2.1 ratio units (ref 0.0–5.0)
HDL: 52 mg/dL (ref >39)
LDL Calculated: 29 mg/dL (ref 0–99)
Triglycerides: 144 mg/dL (ref 0–149)
VLDL CHOLESTEROL CAL: 29 mg/dL (ref 5–40)

## 2015-05-28 NOTE — Addendum Note (Signed)
Addended by: Thana Ates on: 05/28/2015 03:35 PM   Modules accepted: Orders

## 2015-05-29 LAB — SPECIMEN STATUS REPORT

## 2015-06-01 LAB — IRON AND TIBC
Iron Saturation: 4 % — CL (ref 15–55)
Iron: 15 ug/dL — ABNORMAL LOW (ref 38–169)
TIBC: 373 ug/dL (ref 250–450)
UIBC: 358 ug/dL — AB (ref 111–343)

## 2015-06-01 LAB — SPECIMEN STATUS REPORT

## 2015-06-01 LAB — FERRITIN: Ferritin: 10 ng/mL — ABNORMAL LOW (ref 30–400)

## 2015-06-04 ENCOUNTER — Other Ambulatory Visit: Payer: Self-pay | Admitting: Family Medicine

## 2015-06-04 ENCOUNTER — Other Ambulatory Visit: Payer: Self-pay | Admitting: Pharmacist

## 2015-06-04 MED ORDER — INTEGRA PLUS PO CAPS: 1.0000 | ORAL_CAPSULE | Freq: Every day | ORAL | Status: DC

## 2015-06-04 NOTE — Telephone Encounter (Signed)
Patient was notified about iron sat and other labs.  Rx was sent to [facility] for Integra iron supplement - take 1 capsule daily.  Due to recheck CBC at pre op around 06/09/2015. Also discussed home BG readings.  Patient states that BG is improving since finished prednisone 2 days ago.  His BG was 80 this am.

## 2015-06-15 ENCOUNTER — Encounter (HOSPITAL_COMMUNITY)
Admission: RE | Admit: 2015-06-15 | Discharge: 2015-06-15 | Disposition: A | Discharge status: Home / Self Care | Payer: Medicare Other | Source: Ambulatory Visit | Attending: Orthopedic Surgery | Admitting: Orthopedic Surgery

## 2015-06-15 ENCOUNTER — Encounter (HOSPITAL_COMMUNITY): Payer: Self-pay

## 2015-06-15 DIAGNOSIS — M1612 Unilateral primary osteoarthritis, left hip: Secondary | ICD-10-CM | POA: Insufficient documentation

## 2015-06-15 DIAGNOSIS — Z01812 Encounter for preprocedural laboratory examination: Secondary | ICD-10-CM | POA: Insufficient documentation

## 2015-06-15 HISTORY — DX: Personal history of other diseases of the digestive system: Z87.19

## 2015-06-15 HISTORY — DX: Cardiac arrhythmia, unspecified: I49.9

## 2015-06-15 HISTORY — DX: Personal history of urinary (tract) infections: Z87.440

## 2015-06-15 LAB — CBC
HCT: 35.6 % — ABNORMAL LOW (ref 39.0–52.0)
HEMOGLOBIN: 10.4 g/dL — AB (ref 13.0–17.0)
MCH: 21.4 pg — AB (ref 26.0–34.0)
MCHC: 29.2 g/dL — AB (ref 30.0–36.0)
MCV: 73.4 fL — ABNORMAL LOW (ref 78.0–100.0)
Platelets: 364 10*3/uL (ref 150–400)
RBC: 4.85 MIL/uL (ref 4.22–5.81)
RDW: 19.4 % — ABNORMAL HIGH (ref 11.5–15.5)
WBC: 7.9 10*3/uL (ref 4.0–10.5)

## 2015-06-15 LAB — URINE MICROSCOPIC-ADD ON
BACTERIA UA: NONE SEEN
Squamous Epithelial / LPF: NONE SEEN

## 2015-06-15 LAB — SURGICAL PCR SCREEN
MRSA, PCR: NEGATIVE
Staphylococcus aureus: NEGATIVE

## 2015-06-15 LAB — URINALYSIS, ROUTINE W REFLEX MICROSCOPIC
Bilirubin Urine: NEGATIVE
Glucose, UA: >1000 mg/dL — AB
Hgb urine dipstick: NEGATIVE
KETONES UR: NEGATIVE mg/dL
LEUKOCYTES UA: NEGATIVE
NITRITE: NEGATIVE
PH: 6 (ref 5.0–8.0)
PROTEIN: NEGATIVE mg/dL
Specific Gravity, Urine: 1.036 — ABNORMAL HIGH (ref 1.005–1.030)

## 2015-06-15 LAB — PROTIME-INR
INR: 1.03 (ref 0.00–1.49)
PROTHROMBIN TIME: 13.7 s (ref 11.6–15.2)

## 2015-06-15 LAB — APTT: aPTT: 28 seconds (ref 24–37)

## 2015-06-15 NOTE — Progress Notes (Addendum)
CBC, urinalysis results in epic per PAT visit 06/15/2015 sent to Dr Alvan Dame

## 2015-06-15 NOTE — Patient Instructions (Signed)
Marvin Frost  06/15/2015   Your procedure is scheduled on: Tuesday June 22, 2015   Report to [facility]  Entrance take [place]  elevators to 3rd floor to  [place] at 6:00 AM.  Call this number if you have problems the morning of surgery [phone]   Remember: ONLY 1 PERSON MAY GO WITH YOU TO SHORT STAY TO GET  READY MORNING OF [place].  Do not eat food or drink liquids :After Midnight.     Take these medicines the morning of surgery with A SIP OF WATER: pantoprazole (Protonix);  TAKE 1/2 DOSE OF INSULIN NIGHT PRIOR TO SURGERY DO NOT TAKE ANY DIABETIC MEDICATIONS DAY OF YOUR SURGERY                               You may not have any metal on your body including hair pins and              piercings  Do not wear jewelry,lotions, powders or colognes, deodorant                          Men may shave face and neck.   Do not bring valuables to the hospital. [place].  Contacts, dentures or bridgework may not be worn into surgery.  Leave suitcase in the car. After surgery it may be brought to your room.               Please read over the following fact sheets you were given:MRSA INFORMATION SHEET; INCENTIVE SPIROMETER; BLOOD TRANSFUSION INFORMATION SHEET _____________________________________________________________________             [facility] - Preparing for Surgery Before surgery, you can play an important role.  Because skin is not sterile, your skin needs to be as free of germs as possible.  You can reduce the number of germs on your skin by washing with CHG (chlorahexidine gluconate) soap before surgery.  CHG is an antiseptic cleaner which kills germs and bonds with the skin to continue killing germs even after washing. Please DO NOT use if you have an allergy to CHG or antibacterial soaps.  If your skin becomes reddened/irritated stop using the CHG and inform your nurse when you arrive  at Short Stay. Do not shave (including legs and underarms) for at least 48 hours prior to the first CHG shower.  You may shave your face/neck. Please follow these instructions carefully:  1.  Shower with CHG Soap the night before surgery and the  morning of Surgery.  2.  If you choose to wash your hair, wash your hair first as usual with your  normal  shampoo.  3.  After you shampoo, rinse your hair and body thoroughly to remove the  shampoo.                           4.  Use CHG as you would any other liquid soap.  You can apply chg directly  to the skin and wash                       Gently with a  scrungie or clean washcloth.  5.  Apply the CHG Soap to your body ONLY FROM THE NECK DOWN.   Do not use on face/ open                           Wound or open sores. Avoid contact with eyes, ears mouth and genitals (private parts).                       Wash face,  Genitals (private parts) with your normal soap.             6.  Wash thoroughly, paying special attention to the area where your surgery  will be performed.  7.  Thoroughly rinse your body with warm water from the neck down.  8.  DO NOT shower/wash with your normal soap after using and rinsing off  the CHG Soap.                9.  Pat yourself dry with a clean towel.            10.  Wear clean pajamas.            11.  Place clean sheets on your bed the night of your first shower and do not  sleep with pets. Day of Surgery : Do not apply any lotions/deodorants the morning of surgery.  Please wear clean clothes to the hospital/surgery center.  FAILURE TO FOLLOW THESE INSTRUCTIONS MAY RESULT IN THE CANCELLATION OF YOUR SURGERY PATIENT SIGNATURE_________________________________  NURSE SIGNATURE__________________________________  ________________________________________________________________________   Adam Phenix  An incentive spirometer is a tool that can help keep your lungs clear and active. This tool measures how well you  are filling your lungs with each breath. Taking long deep breaths may help reverse or decrease the chance of developing breathing (pulmonary) problems (especially infection) following:  A long period of time when you are unable to move or be active. BEFORE THE PROCEDURE   If the spirometer includes an indicator to show your best effort, your nurse or respiratory therapist will set it to a desired goal.  If possible, sit up straight or lean slightly forward. Try not to slouch.  Hold the incentive spirometer in an upright position. INSTRUCTIONS FOR USE   Sit on the edge of your bed if possible, or sit up as far as you can in bed or on a chair.  Hold the incentive spirometer in an upright position.  Breathe out normally.  Place the mouthpiece in your mouth and seal your lips tightly around it.  Breathe in slowly and as deeply as possible, raising the piston or the ball toward the top of the column.  Hold your breath for 3-5 seconds or for as long as possible. Allow the piston or ball to fall to the bottom of the column.  Remove the mouthpiece from your mouth and breathe out normally.  Rest for a few seconds and repeat Steps 1 through 7 at least 10 times every 1-2 hours when you are awake. Take your time and take a few normal breaths between deep breaths.  The spirometer may include an indicator to show your best effort. Use the indicator as a goal to work toward during each repetition.  After each set of 10 deep breaths, practice coughing to be sure your lungs are clear. If you have an incision (the cut made at the time of surgery), support your incision  when coughing by placing a pillow or rolled up towels firmly against it. Once you are able to get out of bed, walk around indoors and cough well. You may stop using the incentive spirometer when instructed by your caregiver.  RISKS AND COMPLICATIONS  Take your time so you do not get dizzy or light-headed.  If you are in pain, you may  need to take or ask for pain medication before doing incentive spirometry. It is harder to take a deep breath if you are having pain. AFTER USE  Rest and breathe slowly and easily.  It can be helpful to keep track of a log of your progress. Your caregiver can provide you with a simple table to help with this. If you are using the spirometer at home, follow these instructions: [place] IF:   You are having difficultly using the spirometer.  You have trouble using the spirometer as often as instructed.  Your pain medication is not giving enough relief while using the spirometer.  You develop fever of 100.5 F (38.1 C) or higher. SEEK IMMEDIATE MEDICAL CARE IF:   You cough up bloody sputum that had not been present before.  You develop fever of 102 F (38.9 C) or greater.  You develop worsening pain at or near the incision site. MAKE SURE YOU:   Understand these instructions.  Will watch your condition.  Will get help right away if you are not doing well or get worse. Document Released: 10/23/2006 Document Revised: 09/04/2011 Document Reviewed: 12/24/2006 ExitCare Patient Information 2014 ExitCare, [place].   ________________________________________________________________________  WHAT IS A BLOOD TRANSFUSION? Blood Transfusion Information  A transfusion is the replacement of blood or some of its parts. Blood is made up of multiple cells which provide different functions.  Red blood cells carry oxygen and are used for blood loss replacement.  White blood cells fight against infection.  Platelets control bleeding.  Plasma helps clot blood.  Other blood products are available for specialized needs, such as hemophilia or other clotting disorders. BEFORE THE TRANSFUSION  Who gives blood for transfusions?   Healthy volunteers who are fully evaluated to make sure their blood is safe. This is blood bank blood. Transfusion therapy is the safest it has ever been in  the practice of medicine. Before blood is taken from a donor, a complete history is taken to make sure that person has no history of diseases nor engages in risky social behavior (examples are intravenous drug use or sexual activity with multiple partners). The donor's travel history is screened to minimize risk of transmitting infections, such as malaria. The donated blood is tested for signs of infectious diseases, such as HIV and hepatitis. The blood is then tested to be sure it is compatible with you in order to minimize the chance of a transfusion reaction. If you or a relative donates blood, this is often done in anticipation of surgery and is not appropriate for emergency situations. It takes many days to process the donated blood. RISKS AND COMPLICATIONS Although transfusion therapy is very safe and saves many lives, the main dangers of transfusion include:   Getting an infectious disease.  Developing a transfusion reaction. This is an allergic reaction to something in the blood you were given. Every precaution is taken to prevent this. The decision to have a blood transfusion has been considered carefully by your caregiver before blood is given. Blood is not given unless the benefits outweigh the risks. AFTER THE TRANSFUSION  Right after  receiving a blood transfusion, you will usually feel much better and more energetic. This is especially true if your red blood cells have gotten low (anemic). The transfusion raises the level of the red blood cells which carry oxygen, and this usually causes an energy increase.  The nurse administering the transfusion will monitor you carefully for complications. HOME CARE INSTRUCTIONS  No special instructions are needed after a transfusion. You may find your energy is better. Speak with your caregiver about any limitations on activity for underlying diseases you may have. SEEK MEDICAL CARE IF:   Your condition is not improving after your  transfusion.  You develop redness or irritation at the intravenous (IV) site. SEEK IMMEDIATE MEDICAL CARE IF:  Any of the following symptoms occur over the next 12 hours:  Shaking chills.  You have a temperature by mouth above 102 F (38.9 C), not controlled by medicine.  Chest, back, or muscle pain.  People around you feel you are not acting correctly or are confused.  Shortness of breath or difficulty breathing.  Dizziness and fainting.  You get a rash or develop hives.  You have a decrease in urine output.  Your urine turns a dark color or changes to pink, red, or brown. Any of the following symptoms occur over the next 10 days:  You have a temperature by mouth above 102 F (38.9 C), not controlled by medicine.  Shortness of breath.  Weakness after normal activity.  The white part of the eye turns yellow (jaundice).  You have a decrease in the amount of urine or are urinating less often.  Your urine turns a dark color or changes to pink, red, or brown. Document Released: 06/09/2000 Document Revised: 09/04/2011 Document Reviewed: 01/27/2008 [facility] Patient Information 2014 [place], [place].  _______________________________________________________________________

## 2015-06-15 NOTE — Progress Notes (Signed)
CMP results in epic 05/27/2015 LOV note per Dr Percival Spanish epic 01/2015 discusses knowledge and clearance for right hip surgery which pt had in 02/2015. Pt states Dr Percival Spanish aware that he was to have both hip surgeries preformed this year. Pt states sees cardiology yearly.  EKG/epic 11/24/2014 Stress test / epic 04/06/2012 A1C results discussed per Cherre Robins MD per 05/27/2015 note/epic

## 2015-06-17 NOTE — H&P (Signed)
TOTAL HIP ADMISSION H&P  Patient is admitted for left total hip arthroplasty, anterior approach.  Subjective:  Chief Complaint:  Left hip primary OA / pain  HPI: Marvin Frost, 69 y.o. male, has a history of pain and functional disability in the left hip(s) due to arthritis and patient has failed non-surgical conservative treatments for greater than 12 weeks to include NSAID's and/or analgesics, corticosteriod injections, use of assistive devices and activity modification.  Onset of symptoms was gradual starting 2+ years ago with gradually worsening course since that time.The patient noted prior procedures of the hip to include arthroplasty on the right hip(s).  Patient currently rates pain in the left hip at 8 out of 10 with activity. Patient has night pain, worsening of pain with activity and weight bearing, trendelenberg gait, pain that interfers with activities of daily living and pain with passive range of motion. Patient has evidence of periarticular osteophytes and joint space narrowing by imaging studies. This condition presents safety issues increasing the risk of falls.  There is no current active infection.   Risks, benefits and expectations were discussed with the patient.  Risks including but not limited to the risk of anesthesia, blood clots, nerve damage, blood vessel damage, failure of the prosthesis, infection and up to and including death.  Patient understand the risks, benefits and expectations and wishes to proceed with surgery.   PCP: Redge Gainer, MD  D/C Plans:      Home  Post-op Meds:       No Rx given   Tranexamic Acid:      To be given - IV  Decadron:       Is to be given  FYI:     ASA  Norco    Patient Active Problem List   Diagnosis Date Noted  . S/P right THA, AA 03/16/2015  . Primary osteoarthritis of both knees 01/26/2015  . Nonspecific (abnormal) findings on radiological and other examination of gastrointestinal tract 09/27/2010  . NAUSEA AND VOMITING  08/16/2010  . Type 2 diabetes mellitus not at goal [facility]) 03/18/2009  . HYPERLIPIDEMIA 03/18/2009  . OBESITY 03/18/2009  . Essential hypertension 03/18/2009  . Coronary atherosclerosis 03/18/2009  . [facility] 03/18/2009  . GERD 03/18/2009   Past Medical History  Diagnosis Date  . CAD (coronary artery disease)     Nonobstructive Cath 2004  . Hypertension   . Diabetes mellitus   . Hyperlipemia   . GERD (gastroesophageal reflux disease)   . Obesity   . Pancreatitis   . Cataract   . Heart palpitations     last episode- 09/2014 seen by Dr Percival Spanish 5/16- clearance for surgery   . Arthritis   . Dysrhythmia     history of heart palpitations   . History of frequent urinary tract infections   . History of hiatal hernia   . 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 Right shoulder surgery    . Hydrocele excision / repair    . Colonoscopy  11/2003    diverticulosis, no polyps  . Upper gastrointestinal endoscopy  08/19/2010    hiatal hernia 2cm, mild gastritis  . Cardiac catheterization      2004 and 1999- done by Dr Percival Spanish   . Left calf surgery       related to trauma of being torn   . Left index finger arthrioscopic surgery     .  Lymph node removed from right neck     . Total hip arthroplasty Right 03/16/2015    Procedure: RIGHT TOTAL HIP ARTHROPLASTY ANTERIOR APPROACH;  Surgeon: Paralee Cancel, MD;  Location: [facility] ORS;  Service: Orthopedics;  Laterality: Right;    No prescriptions prior to admission   Allergies  Allergen Reactions  . Baxinets [Cetylpyridinium-Benzocaine]     unknown  . Celebrex [Celecoxib] Nausea Only  . Clarithromycin     unkown  . Liraglutide Other (See Comments)    Abdominal pain and enlarged pancreas on CT ? pancreatitis  . Statins Other (See Comments)    Unknown- muscle and joint pain if taken for long periods of time, takes Crestor every 2-3 days to avoid this reaction  . Toprol Xl  [Metoprolol Succinate] Other (See Comments)    Unknown   . Triplex Ad Other (See Comments)    Abdominal pain and enlarged pancreas on CT? pancreatitis  . Welchol [Colesevelam Hcl] Other (See Comments)    constipation  . Norvasc [Amlodipine Besylate] Rash    Social History  Substance Use Topics  . Smoking status: Former Smoker -- 1.00 packs/day for 12 years    Types: Cigarettes    Quit date: 02/05/1984  . Smokeless tobacco: Former Systems developer    Types: [place] date: 06/26/1978  . Alcohol Use: No    Family History  Problem Relation Age of Onset  . Heart disease Mother   . Diabetes Brother   . Heart disease Brother   . Heart attack Brother 61  . Heart attack Father 61     Review of Systems  Constitutional: Negative.   HENT: Negative.   Eyes: Negative.   Respiratory: Negative.   Cardiovascular: Negative.   Gastrointestinal: Positive for heartburn.  Genitourinary: Negative.   Musculoskeletal: Positive for joint pain.  Skin: Negative.   Neurological: Negative.   Endo/Heme/Allergies: Negative.   Psychiatric/Behavioral: Negative.     Objective:  Physical Exam  Constitutional: He is oriented to person, place, and time. He appears well-developed.  HENT:  Head: Normocephalic.  Eyes: Pupils are equal, round, and reactive to light.  Neck: Neck supple. No JVD present. No tracheal deviation present. No thyromegaly present.  Cardiovascular: Normal rate, regular rhythm, normal heart sounds and intact distal pulses.   Respiratory: Effort normal and breath sounds normal. No stridor. No respiratory distress. He has no wheezes.  GI: Soft. There is no tenderness. There is no guarding.  Musculoskeletal:       Left hip: He exhibits decreased range of motion, decreased strength, tenderness and bony tenderness. He exhibits no swelling, no deformity and no laceration.  Lymphadenopathy:    He has no cervical adenopathy.  Neurological: He is alert and oriented to person, place, and time.   Skin: Skin is warm and dry.  Psychiatric: He has a normal mood and affect.      Labs:  Estimated body mass index is 33.15 kg/(m^2) as calculated from the following:   Height as of 04/15/15: 5\' 8"  (1.727 m).   Weight as of 04/15/15: 98.884 kg (218 lb).   Imaging Review Plain radiographs demonstrate severe degenerative joint disease of the left hip(s). The bone quality appears to be good for age and reported activity level.  Assessment/Plan:  End stage arthritis, left hip(s)  The patient history, physical examination, clinical judgement of the provider and imaging studies are consistent with end stage degenerative joint disease of the left hip(s) and total hip arthroplasty is deemed medically necessary. The  treatment options including medical management, injection therapy, arthroscopy and arthroplasty were discussed at length. The risks and benefits of total hip arthroplasty were presented and reviewed. The risks due to aseptic loosening, infection, stiffness, dislocation/subluxation,  thromboembolic complications and other imponderables were discussed.  The patient acknowledged the explanation, agreed to proceed with the plan and consent was signed. Patient is being admitted for inpatient treatment for surgery, pain control, PT, OT, prophylactic antibiotics, VTE prophylaxis, progressive ambulation and ADL's and discharge planning.The patient is planning to be discharged home.      [place] Pugh Leonell Lobdell   PA-C  06/17/2015, 8:48 AM

## 2015-06-21 NOTE — Anesthesia Preprocedure Evaluation (Addendum)
Anesthesia Evaluation  Patient identified by MRN, date of birth, ID band Patient awake    Reviewed: Allergy & Precautions, NPO status , Patient's Chart, lab work & pertinent test results  Airway Mallampati: III  TM Distance: >3 FB Neck ROM: Full    Dental  (+) Teeth Intact   Pulmonary former smoker,    breath sounds clear to auscultation       Cardiovascular hypertension, + CAD   Rhythm:Regular Rate:Normal     Neuro/Psych negative neurological ROS  negative psych ROS   GI/Hepatic hiatal hernia, GERD  Medicated,  Endo/Other  diabetes, Type 1, Insulin Dependent  Renal/GU   negative genitourinary   Musculoskeletal  (+) Arthritis , Osteoarthritis,    Abdominal   Peds negative pediatric ROS (+)  Hematology   Anesthesia Other Findings   Reproductive/Obstetrics                            Lab Results  Component Value Date   WBC 7.9 06/15/2015   HGB 10.4* 06/15/2015   HCT 35.6* 06/15/2015   MCV 73.4* 06/15/2015   PLT 364 06/15/2015   Lab Results  Component Value Date   CREATININE 0.81 05/27/2015   BUN 14 05/27/2015   NA 141 05/27/2015   K 4.4 05/27/2015   CL 100 05/27/2015   CO2 24 05/27/2015   Lab Results  Component Value Date   INR 1.03 06/15/2015   INR 0.98 03/10/2015   10/2014: EKG: NSR   Anesthesia Physical Anesthesia Plan  ASA: III  Anesthesia Plan: Spinal   Post-op Pain Management:    Induction: Intravenous  Airway Management Planned: Natural Airway  Additional Equipment:   Intra-op Plan:   Post-operative Plan:   Informed Consent: I have reviewed the patients History and Physical, chart, labs and discussed the procedure including the risks, benefits and alternatives for the proposed anesthesia with the patient or authorized representative who has indicated his/her understanding and acceptance.     Plan Discussed with: CRNA  Anesthesia Plan Comments:          Anesthesia Quick Evaluation

## 2015-06-22 ENCOUNTER — Inpatient Hospital Stay (HOSPITAL_COMMUNITY): Payer: Medicare Other

## 2015-06-22 ENCOUNTER — Inpatient Hospital Stay (HOSPITAL_COMMUNITY): Payer: Medicare Other | Admitting: Anesthesiology

## 2015-06-22 ENCOUNTER — Encounter (HOSPITAL_COMMUNITY)
Admission: RE | Disposition: A | Discharge status: Home Health | Payer: Self-pay | Source: Ambulatory Visit | Attending: Orthopedic Surgery

## 2015-06-22 ENCOUNTER — Inpatient Hospital Stay (HOSPITAL_COMMUNITY)
Admission: RE | Admit: 2015-06-22 | Discharge: 2015-06-23 | DRG: 470 | Disposition: A | Discharge status: Home Health | Payer: Medicare Other | Source: Ambulatory Visit | Attending: Orthopedic Surgery | Admitting: Orthopedic Surgery

## 2015-06-22 ENCOUNTER — Encounter (HOSPITAL_COMMUNITY): Payer: Self-pay | Admitting: *Deleted

## 2015-06-22 DIAGNOSIS — Z96641 Presence of right artificial hip joint: Secondary | ICD-10-CM | POA: Diagnosis present

## 2015-06-22 DIAGNOSIS — I10 Essential (primary) hypertension: Secondary | ICD-10-CM | POA: Diagnosis present

## 2015-06-22 DIAGNOSIS — E669 Obesity, unspecified: Secondary | ICD-10-CM | POA: Diagnosis present

## 2015-06-22 DIAGNOSIS — Z79899 Other long term (current) drug therapy: Secondary | ICD-10-CM | POA: Diagnosis not present

## 2015-06-22 DIAGNOSIS — K219 Gastro-esophageal reflux disease without esophagitis: Secondary | ICD-10-CM | POA: Diagnosis present

## 2015-06-22 DIAGNOSIS — M170 Bilateral primary osteoarthritis of knee: Secondary | ICD-10-CM | POA: Diagnosis present

## 2015-06-22 DIAGNOSIS — M1612 Unilateral primary osteoarthritis, left hip: Secondary | ICD-10-CM | POA: Diagnosis present

## 2015-06-22 DIAGNOSIS — Z6834 Body mass index (BMI) 34.0-34.9, adult: Secondary | ICD-10-CM | POA: Diagnosis not present

## 2015-06-22 DIAGNOSIS — M25552 Pain in left hip: Secondary | ICD-10-CM | POA: Diagnosis not present

## 2015-06-22 DIAGNOSIS — Z471 Aftercare following joint replacement surgery: Secondary | ICD-10-CM | POA: Diagnosis not present

## 2015-06-22 DIAGNOSIS — Z01812 Encounter for preprocedural laboratory examination: Secondary | ICD-10-CM

## 2015-06-22 DIAGNOSIS — E119 Type 2 diabetes mellitus without complications: Secondary | ICD-10-CM | POA: Diagnosis present

## 2015-06-22 DIAGNOSIS — Z8249 Family history of ischemic heart disease and other diseases of the circulatory system: Secondary | ICD-10-CM | POA: Diagnosis not present

## 2015-06-22 DIAGNOSIS — Z87891 Personal history of nicotine dependence: Secondary | ICD-10-CM

## 2015-06-22 DIAGNOSIS — Z96649 Presence of unspecified artificial hip joint: Secondary | ICD-10-CM

## 2015-06-22 DIAGNOSIS — I2510 Atherosclerotic heart disease of native coronary artery without angina pectoris: Secondary | ICD-10-CM | POA: Diagnosis present

## 2015-06-22 DIAGNOSIS — M169 Osteoarthritis of hip, unspecified: Secondary | ICD-10-CM | POA: Diagnosis not present

## 2015-06-22 DIAGNOSIS — K449 Diaphragmatic hernia without obstruction or gangrene: Secondary | ICD-10-CM | POA: Diagnosis present

## 2015-06-22 DIAGNOSIS — E785 Hyperlipidemia, unspecified: Secondary | ICD-10-CM | POA: Diagnosis present

## 2015-06-22 DIAGNOSIS — Z794 Long term (current) use of insulin: Secondary | ICD-10-CM | POA: Diagnosis not present

## 2015-06-22 DIAGNOSIS — Z96642 Presence of left artificial hip joint: Secondary | ICD-10-CM | POA: Diagnosis not present

## 2015-06-22 HISTORY — PX: TOTAL HIP ARTHROPLASTY: SHX124

## 2015-06-22 LAB — GLUCOSE, CAPILLARY
GLUCOSE-CAPILLARY: 150 mg/dL — AB (ref 65–99)
GLUCOSE-CAPILLARY: 228 mg/dL — AB (ref 65–99)
Glucose-Capillary: 181 mg/dL — ABNORMAL HIGH (ref 65–99)
Glucose-Capillary: 251 mg/dL — ABNORMAL HIGH (ref 65–99)

## 2015-06-22 LAB — TYPE AND SCREEN
ABO/RH(D): A POS
Antibody Screen: NEGATIVE

## 2015-06-22 SURGERY — ARTHROPLASTY, HIP, TOTAL, ANTERIOR APPROACH
Anesthesia: Spinal | Site: Hip | Laterality: Left

## 2015-06-22 MED ORDER — ROSUVASTATIN CALCIUM 10 MG PO TABS
10.0000 mg | ORAL_TABLET | ORAL | Status: DC
  Filled 2015-06-22: qty 1

## 2015-06-22 MED ORDER — METHOCARBAMOL 1000 MG/10ML IJ SOLN
500.0000 mg | Freq: Four times a day (QID) | INTRAVENOUS | Status: DC | PRN
  Administered 2015-06-22: 500 mg via INTRAVENOUS
  Filled 2015-06-22 (×2): qty 5

## 2015-06-22 MED ORDER — SODIUM CHLORIDE 0.9 % IR SOLN
Status: DC | PRN
  Administered 2015-06-22: 1000 mL

## 2015-06-22 MED ORDER — MIDAZOLAM HCL 2 MG/2ML IJ SOLN
INTRAMUSCULAR | Status: AC
  Filled 2015-06-22: qty 2

## 2015-06-22 MED ORDER — INSULIN ASPART 100 UNIT/ML ~~LOC~~ SOLN
0.0000 [IU] | Freq: Three times a day (TID) | SUBCUTANEOUS | Status: DC
  Administered 2015-06-22: 8 [IU] via SUBCUTANEOUS

## 2015-06-22 MED ORDER — MENTHOL 3 MG MT LOZG: 1.0000 | LOZENGE | OROMUCOSAL | Status: DC | PRN

## 2015-06-22 MED ORDER — DEXAMETHASONE SODIUM PHOSPHATE 10 MG/ML IJ SOLN
10.0000 mg | Freq: Once | INTRAMUSCULAR | Status: AC
  Administered 2015-06-22: 10 mg via INTRAVENOUS

## 2015-06-22 MED ORDER — TRANEXAMIC ACID 1000 MG/10ML IV SOLN
1000.0000 mg | Freq: Once | INTRAVENOUS | Status: AC
  Administered 2015-06-22: 1000 mg via INTRAVENOUS
  Filled 2015-06-22: qty 10

## 2015-06-22 MED ORDER — ONDANSETRON HCL 4 MG/2ML IJ SOLN
4.0000 mg | Freq: Four times a day (QID) | INTRAMUSCULAR | Status: DC | PRN
Start: 2015-06-22 — End: 2015-06-23

## 2015-06-22 MED ORDER — BUPIVACAINE IN DEXTROSE 0.75-8.25 % IT SOLN
INTRATHECAL | Status: DC | PRN
  Administered 2015-06-22: 15 mg via INTRATHECAL

## 2015-06-22 MED ORDER — LACTATED RINGERS IV SOLN: INTRAVENOUS | Status: DC

## 2015-06-22 MED ORDER — DEXAMETHASONE SODIUM PHOSPHATE 4 MG/ML IJ SOLN: INTRAMUSCULAR | Status: DC | PRN

## 2015-06-22 MED ORDER — METFORMIN HCL 500 MG PO TABS
1000.0000 mg | ORAL_TABLET | Freq: Two times a day (BID) | ORAL | Status: DC
  Administered 2015-06-22: 1000 mg via ORAL
  Filled 2015-06-22 (×4): qty 2

## 2015-06-22 MED ORDER — ASPIRIN EC 325 MG PO TBEC
325.0000 mg | DELAYED_RELEASE_TABLET | Freq: Two times a day (BID) | ORAL | Status: DC
  Administered 2015-06-23: 325 mg via ORAL
  Filled 2015-06-22 (×3): qty 1

## 2015-06-22 MED ORDER — INSULIN DEGLUDEC 200 UNIT/ML ~~LOC~~ SOPN: 60.0000 [IU] | PEN_INJECTOR | Freq: Every day | SUBCUTANEOUS | Status: DC

## 2015-06-22 MED ORDER — BISACODYL 10 MG RE SUPP: 10.0000 mg | Freq: Every day | RECTAL | Status: DC | PRN

## 2015-06-22 MED ORDER — DEXAMETHASONE SODIUM PHOSPHATE 10 MG/ML IJ SOLN: 10.0000 mg | Freq: Once | INTRAMUSCULAR | Status: DC

## 2015-06-22 MED ORDER — METOCLOPRAMIDE HCL 10 MG PO TABS: 5.0000 mg | ORAL_TABLET | Freq: Three times a day (TID) | ORAL | Status: DC | PRN

## 2015-06-22 MED ORDER — PROPOFOL 10 MG/ML IV BOLUS
INTRAVENOUS | Status: AC
  Filled 2015-06-22: qty 60

## 2015-06-22 MED ORDER — SODIUM CHLORIDE 0.9 % IV SOLN
100.0000 mL/h | INTRAVENOUS | Status: DC
  Administered 2015-06-22: 100 mL/h via INTRAVENOUS
  Administered 2015-06-23: 30 mL/h via INTRAVENOUS
  Filled 2015-06-22 (×3): qty 1000

## 2015-06-22 MED ORDER — PROPOFOL 500 MG/50ML IV EMUL
INTRAVENOUS | Status: DC | PRN
  Administered 2015-06-22: 50 ug/kg/min via INTRAVENOUS

## 2015-06-22 MED ORDER — ALUM & MAG HYDROXIDE-SIMETH 200-200-20 MG/5ML PO SUSP: 30.0000 mL | ORAL | Status: DC | PRN

## 2015-06-22 MED ORDER — OLMESARTAN MEDOXOMIL-HCTZ 40-25 MG PO TABS: 1.0000 | ORAL_TABLET | Freq: Every day | ORAL | Status: DC

## 2015-06-22 MED ORDER — FENTANYL CITRATE (PF) 100 MCG/2ML IJ SOLN
25.0000 ug | INTRAMUSCULAR | Status: DC | PRN
  Administered 2015-06-22: 50 ug via INTRAVENOUS

## 2015-06-22 MED ORDER — PROPOFOL 10 MG/ML IV BOLUS
INTRAVENOUS | Status: AC
  Filled 2015-06-22: qty 20

## 2015-06-22 MED ORDER — HYDROCHLOROTHIAZIDE 25 MG PO TABS
25.0000 mg | ORAL_TABLET | Freq: Every day | ORAL | Status: DC
  Administered 2015-06-22 – 2015-06-23 (×2): 25 mg via ORAL
  Filled 2015-06-22 (×2): qty 1

## 2015-06-22 MED ORDER — PANTOPRAZOLE SODIUM 40 MG PO TBEC
40.0000 mg | DELAYED_RELEASE_TABLET | Freq: Two times a day (BID) | ORAL | Status: DC
  Administered 2015-06-22 – 2015-06-23 (×2): 40 mg via ORAL
  Filled 2015-06-22 (×4): qty 1

## 2015-06-22 MED ORDER — METHOCARBAMOL 500 MG PO TABS
500.0000 mg | ORAL_TABLET | Freq: Four times a day (QID) | ORAL | Status: DC | PRN
  Administered 2015-06-22 – 2015-06-23 (×2): 500 mg via ORAL
  Filled 2015-06-22 (×2): qty 1

## 2015-06-22 MED ORDER — CEFAZOLIN SODIUM-DEXTROSE 2-3 GM-% IV SOLR
INTRAVENOUS | Status: AC
  Filled 2015-06-22: qty 50

## 2015-06-22 MED ORDER — MAGNESIUM CITRATE PO SOLN: 1.0000 | Freq: Once | ORAL | Status: DC | PRN

## 2015-06-22 MED ORDER — FERROUS SULFATE 325 (65 FE) MG PO TABS
325.0000 mg | ORAL_TABLET | Freq: Three times a day (TID) | ORAL | Status: DC
  Administered 2015-06-22 – 2015-06-23 (×2): 325 mg via ORAL
  Filled 2015-06-22 (×5): qty 1

## 2015-06-22 MED ORDER — METOCLOPRAMIDE HCL 5 MG/ML IJ SOLN: 5.0000 mg | Freq: Three times a day (TID) | INTRAMUSCULAR | Status: DC | PRN

## 2015-06-22 MED ORDER — EMPAGLIFLOZIN-METFORMIN HCL 12.5-1000 MG PO TABS: 1.0000 | ORAL_TABLET | Freq: Two times a day (BID) | ORAL | Status: DC

## 2015-06-22 MED ORDER — STERILE WATER FOR IRRIGATION IR SOLN
Status: DC | PRN
  Administered 2015-06-22: 1

## 2015-06-22 MED ORDER — LACTATED RINGERS IV SOLN
INTRAVENOUS | Status: DC
  Administered 2015-06-22: 1000 mL via INTRAVENOUS
  Administered 2015-06-22: 11:00:00 via INTRAVENOUS

## 2015-06-22 MED ORDER — MEPERIDINE HCL 50 MG/ML IJ SOLN: 6.2500 mg | INTRAMUSCULAR | Status: DC | PRN

## 2015-06-22 MED ORDER — POLYETHYLENE GLYCOL 3350 17 G PO PACK
17.0000 g | PACK | Freq: Two times a day (BID) | ORAL | Status: DC
  Administered 2015-06-22 – 2015-06-23 (×2): 17 g via ORAL

## 2015-06-22 MED ORDER — PHENOL 1.4 % MT LIQD: 1.0000 | OROMUCOSAL | Status: DC | PRN

## 2015-06-22 MED ORDER — CHLORHEXIDINE GLUCONATE 4 % EX LIQD: 60.0000 mL | Freq: Once | CUTANEOUS | Status: DC

## 2015-06-22 MED ORDER — LIDOCAINE HCL (CARDIAC) 20 MG/ML IV SOLN
INTRAVENOUS | Status: AC
  Filled 2015-06-22: qty 5

## 2015-06-22 MED ORDER — HYDROMORPHONE HCL 1 MG/ML IJ SOLN
0.5000 mg | INTRAMUSCULAR | Status: DC | PRN
  Administered 2015-06-22: 0.5 mg via INTRAVENOUS
  Administered 2015-06-22: 1 mg via INTRAVENOUS
  Filled 2015-06-22 (×2): qty 1

## 2015-06-22 MED ORDER — CEFAZOLIN SODIUM-DEXTROSE 2-3 GM-% IV SOLR
2.0000 g | Freq: Four times a day (QID) | INTRAVENOUS | Status: AC
  Administered 2015-06-22 (×2): 2 g via INTRAVENOUS
  Filled 2015-06-22 (×2): qty 50

## 2015-06-22 MED ORDER — FENTANYL CITRATE (PF) 100 MCG/2ML IJ SOLN
INTRAMUSCULAR | Status: AC
  Filled 2015-06-22: qty 2

## 2015-06-22 MED ORDER — HYDROCODONE-ACETAMINOPHEN 7.5-325 MG PO TABS
1.0000 | ORAL_TABLET | ORAL | Status: DC
  Administered 2015-06-22 – 2015-06-23 (×3): 2 via ORAL
  Administered 2015-06-23: 1 via ORAL
  Administered 2015-06-23: 2 via ORAL
  Filled 2015-06-22 (×5): qty 2

## 2015-06-22 MED ORDER — METFORMIN HCL 500 MG PO TABS
1000.0000 mg | ORAL_TABLET | Freq: Two times a day (BID) | ORAL | Status: DC
  Filled 2015-06-22 (×2): qty 2

## 2015-06-22 MED ORDER — CEFAZOLIN SODIUM-DEXTROSE 2-3 GM-% IV SOLR
2.0000 g | INTRAVENOUS | Status: AC
  Administered 2015-06-22: 2 g via INTRAVENOUS

## 2015-06-22 MED ORDER — DOCUSATE SODIUM 100 MG PO CAPS
100.0000 mg | ORAL_CAPSULE | Freq: Two times a day (BID) | ORAL | Status: DC
  Administered 2015-06-22 – 2015-06-23 (×2): 100 mg via ORAL

## 2015-06-22 MED ORDER — LIDOCAINE HCL (CARDIAC) 20 MG/ML IV SOLN
INTRAVENOUS | Status: DC | PRN
  Administered 2015-06-22: 30 mg via INTRAVENOUS

## 2015-06-22 MED ORDER — EMPAGLIFLOZIN 25 MG PO TABS
12.5000 mg | ORAL_TABLET | Freq: Two times a day (BID) | ORAL | Status: DC
  Filled 2015-06-22 (×2): qty 13

## 2015-06-22 MED ORDER — ONDANSETRON HCL 4 MG PO TABS: 4.0000 mg | ORAL_TABLET | Freq: Four times a day (QID) | ORAL | Status: DC | PRN

## 2015-06-22 MED ORDER — IRBESARTAN 300 MG PO TABS
300.0000 mg | ORAL_TABLET | Freq: Every day | ORAL | Status: DC
  Administered 2015-06-22 – 2015-06-23 (×2): 300 mg via ORAL
  Filled 2015-06-22 (×2): qty 1

## 2015-06-22 SURGICAL SUPPLY — 36 items
BAG DECANTER FOR FLEXI CONT (MISCELLANEOUS) IMPLANT
BAG SPEC THK2 15X12 ZIP CLS (MISCELLANEOUS)
BAG ZIPLOCK 12X15 (MISCELLANEOUS) IMPLANT
CAPT HIP TOTAL 2 ×2 IMPLANT
CLOTH BEACON ORANGE TIMEOUT ST (SAFETY) ×3 IMPLANT
COVER PERINEAL POST (MISCELLANEOUS) ×3 IMPLANT
DRAPE STERI IOBAN 125X83 (DRAPES) ×3 IMPLANT
DRAPE U-SHAPE 47X51 STRL (DRAPES) ×6 IMPLANT
DRSG AQUACEL AG ADV 3.5X10 (GAUZE/BANDAGES/DRESSINGS) ×3 IMPLANT
DURAPREP 26ML APPLICATOR (WOUND CARE) ×3 IMPLANT
ELECT REM PT RETURN 15FT ADLT (MISCELLANEOUS) IMPLANT
ELECT REM PT RETURN 9FT ADLT (ELECTROSURGICAL) ×3
ELECTRODE REM PT RTRN 9FT ADLT (ELECTROSURGICAL) ×1 IMPLANT
GLOVE BIOGEL M 7.0 STRL (GLOVE) IMPLANT
GLOVE BIOGEL PI IND STRL 7.5 (GLOVE) ×1 IMPLANT
GLOVE BIOGEL PI IND STRL 8.5 (GLOVE) ×1 IMPLANT
GLOVE BIOGEL PI INDICATOR 7.5 (GLOVE) ×2
GLOVE BIOGEL PI INDICATOR 8.5 (GLOVE) ×2
GLOVE ECLIPSE 8.0 STRL XLNG CF (GLOVE) ×6 IMPLANT
GLOVE ORTHO TXT STRL SZ7.5 (GLOVE) ×3 IMPLANT
GOWN STRL REUS W/TWL LRG LVL3 (GOWN DISPOSABLE) ×3 IMPLANT
GOWN STRL REUS W/TWL XL LVL3 (GOWN DISPOSABLE) ×3 IMPLANT
HOLDER FOLEY CATH W/STRAP (MISCELLANEOUS) ×3 IMPLANT
LIQUID BAND (GAUZE/BANDAGES/DRESSINGS) ×3 IMPLANT
PACK ANTERIOR HIP CUSTOM (KITS) ×3 IMPLANT
SAW OSC TIP CART 19.5X105X1.3 (SAW) ×3 IMPLANT
SUT MNCRL AB 4-0 PS2 18 (SUTURE) ×3 IMPLANT
SUT STRATAFIX 0 PDS 27 VIOLET (SUTURE) ×3
SUT VIC AB 1 CT1 36 (SUTURE) ×9 IMPLANT
SUT VIC AB 2-0 CT1 27 (SUTURE) ×6
SUT VIC AB 2-0 CT1 TAPERPNT 27 (SUTURE) ×2 IMPLANT
SUT VLOC 180 0 24IN GS25 (SUTURE) ×1 IMPLANT
SUTURE STRATFX 0 PDS 27 VIOLET (SUTURE) IMPLANT
TRAY FOLEY W/METER SILVER 16FR (SET/KITS/TRAYS/PACK) IMPLANT
WATER STERILE IRR 1500ML POUR (IV SOLUTION) ×3 IMPLANT
YANKAUER SUCT BULB TIP 10FT TU (MISCELLANEOUS) IMPLANT

## 2015-06-22 NOTE — Evaluation (Signed)
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 Pt will benefit from skilled PT to increase their independence and safety with mobility to allow discharge to the venue listed below.  Pt ambulated short distance in hallway however limited by nausea (which he reports occurred after previous R hip surgery as well).  Pt reports his spouse will assist him upon d/c.     Follow Up Recommendations No PT follow up (per MD at f/u visit)    Equipment Recommendations  None recommended by PT    Recommendations for Other Services       Precautions / Restrictions Precautions Precautions: Fall Precaution Comments: direct anterior approach Restrictions Other Position/Activity Restrictions: WBAT      Mobility  Bed Mobility Overal bed mobility: Needs Assistance Bed Mobility: Supine to Sit     Supine to sit: Min assist     General bed mobility comments: verbal cues for technique, assist for L LE  Transfers Overall transfer level: Needs assistance Equipment used: Rolling walker (2 wheeled) Transfers: Sit to/from Stand Sit to Stand: Min assist         General transfer comment: verbal cues for safe technique, assist to steady upon rise and control descent  Ambulation/Gait Ambulation/Gait assistance: Min assist Ambulation Distance (Feet): 20 Feet Assistive device: Rolling walker (2 wheeled) Gait Pattern/deviations: Step-to pattern;Decreased stance time - left;Antalgic     General Gait Details: verbal cues for sequence, RW positioning, posture - distance limited due to pt with nausea so recliner brought behind pt  Stairs            Wheelchair Mobility    Modified Rankin (Stroke Patients Only)       Balance                                              Pertinent Vitals/Pain Pain Assessment: 0-10 Pain Score: 4  Pain Location: L hip Pain Descriptors / Indicators: Sore Pain Intervention(s): Limited activity within patient's tolerance;Monitored during session;Repositioned;Patient requesting pain meds-RN notified;Ice applied    Home Living Family/patient expects to be discharged to:: Private residence Living Arrangements: Spouse/significant other Available Help at Discharge: Family Type of Home: House Home Access: Stairs to enter Entrance Stairs-Rails: None Entrance Stairs-Number of Steps: 2 Home Layout: Multi-level;Able to live on main level with bedroom/bathroom Home Equipment: Walker - 2 wheels      Prior Function Level of Independence: Independent               Hand Dominance        Extremity/Trunk Assessment               Lower Extremity Assessment: LLE deficits/detail RLE Deficits / Details: assist for L LE with bed mobility, functional hip weakness observed, able to perform ankle pumps       Communication   Communication: No difficulties  Cognition Arousal/Alertness: Awake/alert Behavior During Therapy: WFL for tasks assessed/performed Overall Cognitive Status: Within Functional Limits for tasks assessed                      General  Comments      Exercises        Assessment/Plan    PT Assessment Patient needs continued PT services  PT Diagnosis Difficulty walking;Acute pain   PT Problem List Decreased strength;Decreased mobility;Pain  PT Treatment Interventions Functional mobility training;Stair training;Gait training;DME instruction;Patient/family education;Therapeutic activities;Therapeutic exercise   PT Goals (Current goals can be found in the Care Plan section) Acute Rehab PT Goals PT Goal Formulation: With patient Time For Goal Achievement: 06/26/15 Potential to Achieve Goals: Good    Frequency 7X/week   Barriers  to discharge        Co-evaluation               End of Session Equipment Utilized During Treatment: Gait belt Activity Tolerance: Other (comment) (limited by nausea) Patient left: in chair;with chair alarm set;with call bell/phone within reach Nurse Communication: Mobility status         Time: 1450-1503 PT Time Calculation (min) (ACUTE ONLY): 13 min   Charges:   PT Evaluation $Initial PT Evaluation Tier I: 1 Procedure     PT G Codes:        Lorelie Biermann,KATHrine E 06/22/2015, 3:32 PM Carmelia Bake, PT, DPT 06/22/2015 Pager: [phone]

## 2015-06-22 NOTE — Anesthesia Postprocedure Evaluation (Signed)
Anesthesia Post Note  Patient: Hobart Sieg  Procedure(s) Performed: Procedure(s) (LRB): LEFT TOTAL HIP ARTHROPLASTY ANTERIOR APPROACH (Left)  Patient location during evaluation: PACU Anesthesia Type: Spinal Level of consciousness: oriented and awake and alert Pain management: pain level controlled Vital Signs Assessment: post-procedure vital signs reviewed and stable Respiratory status: spontaneous breathing, respiratory function stable and patient connected to nasal cannula oxygen Cardiovascular status: blood pressure returned to baseline and stable Postop Assessment: no headache, no backache and spinal receding Anesthetic complications: no    Last Vitals:  Filed Vitals:   06/22/15 1200 06/22/15 1223  BP: 128/70 140/83  Pulse: 79 78  Temp: 36.4 C 36.3 C  Resp: 15 16    Last Pain:  Filed Vitals:   06/22/15 1248  PainSc: [place] Rusti Arizmendi

## 2015-06-22 NOTE — Discharge Instructions (Signed)

## 2015-06-22 NOTE — Interval H&P Note (Signed)
History and Physical Interval Note:  06/22/2015 7:20 AM  Marvin Frost  has presented today for surgery, with the diagnosis of LEFT HIP OA  The various methods of treatment have been discussed with the patient and family. After consideration of risks, benefits and other options for treatment, the patient has consented to  Procedure(s): LEFT TOTAL HIP ARTHROPLASTY ANTERIOR APPROACH (Left) as a surgical intervention .  The patient's history has been reviewed, patient examined, no change in status, stable for surgery.  I have reviewed the patient's chart and labs.  Questions were answered to the patient's satisfaction.     Marvin Frost

## 2015-06-22 NOTE — Op Note (Signed)
NAME:  Marvin Frost                ACCOUNT NO.: [id number]      MEDICAL RECORD NO.: [id number]      FACILITY:  [facility]      PHYSICIAN:  Paralee Cancel D  DATE OF BIRTH:  18-Aug-1945     DATE OF PROCEDURE:  06/22/2015                                 OPERATIVE REPORT         PREOPERATIVE DIAGNOSIS: Left  hip osteoarthritis.      POSTOPERATIVE DIAGNOSIS:  Left hip osteoarthritis.      PROCEDURE:  Left total hip replacement through an anterior approach   utilizing DePuy THR system, component size 40mm pinnacle cup, a size 36+4 neutral   Altrex liner, a size 6 Hi Tri Lock stem with a 36+1.5 delta ceramic   ball.      SURGEON:  Pietro Cassis. Alvan Dame, M.D.      ASSISTANT:  Nehemiah Massed, PA-C     ANESTHESIA:  Spinal.      SPECIMENS:  None.      COMPLICATIONS:  None.      BLOOD LOSS:  350 cc     DRAINS:  None.      INDICATION OF THE PROCEDURE:  Marvin Frost is a 69 y.o. male who had   presented to office for evaluation of left hip pain.  Radiographs revealed   progressive degenerative changes with bone-on-bone   articulation to the  hip joint.  The patient had painful limited range of   motion significantly affecting their overall quality of life.  The patient was failing to    respond to conservative measures, and at this point was ready   to proceed with more definitive measures.  The patient has noted progressive   degenerative changes in his hip, progressive problems and dysfunction   with regarding the hip prior to surgery.  Consent was obtained for   benefit of pain relief.  Specific risk of infection, DVT, component   failure, dislocation, need for revision surgery, as well discussion of   the anterior versus posterior approach were reviewed.  Consent was   obtained for benefit of anterior pain relief through an anterior   approach.      PROCEDURE IN DETAIL:  The patient was brought to operative theater.   Once adequate anesthesia, preoperative  antibiotics, 2gm of Ancef, 1 gm of Tranexamic Acid, and 10 mg of Decadron administered.   The patient was positioned supine on the OSI Hanna table.  Once adequate   padding of boney process was carried out, we had predraped out the hip, and  used fluoroscopy to confirm orientation of the pelvis and position.      The left hip was then prepped and draped from proximal iliac crest to   mid thigh with shower curtain technique.      Time-out was performed identifying the patient, planned procedure, and   extremity.     An incision was then made 2 cm distal and lateral to the   anterior superior iliac spine extending over the orientation of the   tensor fascia lata muscle and sharp dissection was carried down to the   fascia of the muscle and protractor placed in the soft tissues.      The fascia was then incised.  The muscle belly was identified and swept   laterally and retractor placed along the superior neck.  Following   cauterization of the circumflex vessels and removing some pericapsular   fat, a second cobra retractor was placed on the inferior neck.  A third   retractor was placed on the anterior acetabulum after elevating the   anterior rectus.  A L-capsulotomy was along the line of the   superior neck to the trochanteric fossa, then extended proximally and   distally.  Tag sutures were placed and the retractors were then placed   intracapsular.  We then identified the trochanteric fossa and   orientation of my neck cut, confirmed this radiographically   and then made a neck osteotomy with the femur on traction.  The femoral   head was removed without difficulty or complication.  Traction was let   off and retractors were placed posterior and anterior around the   acetabulum.      The labrum and foveal tissue were debrided.  I began reaming with a 61mm   reamer and reamed up to 21mm reamer with good bony bed preparation and a 4mm   cup was chosen.  The final 50mm Pinnacle cup  was then impacted under fluoroscopy  to confirm the depth of penetration and orientation with respect to   abduction.  A screw was placed followed by the hole eliminator.  The final   36+4 neutral Altrex liner was impacted with good visualized rim fit.  The cup was positioned anatomically within the acetabular portion of the pelvis.      At this point, the femur was rolled at 80 degrees.  Further capsule was   released off the inferior aspect of the femoral neck.  I then   released the superior capsule proximally.  The hook was placed laterally   along the femur and elevated manually and held in position with the bed   hook.  The leg was then extended and adducted with the leg rolled to 100   degrees of external rotation.  Once the proximal femur was fully   exposed, I used a box osteotome to set orientation.  I then began   broaching with the starting chili pepper broach and passed this by hand and then broached up to first the 5 then the 6 after trials.  With the 6 broach in place I chose a high offset neck and did several trial reductions.  The offset was appropriate, leg lengths   appeared to be equal, confirmed radiographically.   Given these findings, I went ahead and dislocated the hip, repositioned all   retractors and positioned the right hip in the extended and abducted position.  The final 6 hi Tri Lock stem was   chosen and it was impacted down to the level of neck cut.  Based on this   and the trial reduction, a 36+1.5 delta ceramic ball was chosen and   impacted onto a clean and dry trunnion, and the hip was reduced.  The   hip had been irrigated throughout the case again at this point.  I did   reapproximate the superior capsular leaflet to the anterior leaflet   using #1 Vicryl.  The fascia of the   tensor fascia lata muscle was then reapproximated using #1 Vicryl and #0 Quill sutures.  The   remaining wound was closed with 2-0 Vicryl and running 4-0 Monocryl.   The hip was  cleaned, dried, and dressed sterilely using  Dermabond and   Aquacel dressing.  He was then brought   to recovery room in stable condition tolerating the procedure well.    Nehemiah Massed, PA-C was present for the entirety of the case involved from   preoperative positioning, perioperative retractor management, general   facilitation of the case, as well as primary wound closure as assistant.            Pietro Cassis Alvan Dame, M.D.        06/22/2015 10:30 AM

## 2015-06-22 NOTE — Progress Notes (Signed)
Utilization review completed.  

## 2015-06-22 NOTE — Progress Notes (Signed)
Portable AP Pelvis and Lateral Left Hip X-rays done. 

## 2015-06-22 NOTE — Transfer of Care (Signed)
Immediate Anesthesia Transfer of Care Note  Patient: Marvin Frost  Procedure(s) Performed: Procedure(s): LEFT TOTAL HIP ARTHROPLASTY ANTERIOR APPROACH (Left)  Patient Location: PACU  Anesthesia Type:Spinal  Level of Consciousness: awake, alert  and patient cooperative  Airway & Oxygen Therapy: Patient Spontanous Breathing and Patient connected to face mask oxygen  Post-op Assessment: Report given to RN, Post -op Vital signs reviewed and stable and SAB L1  Post vital signs: Reviewed and stable  Last Vitals:  Filed Vitals:   06/22/15 0558  BP: 144/76  Pulse: 89  Temp: 36.5 C  Resp: 18    Complications: No apparent anesthesia complications

## 2015-06-22 NOTE — Anesthesia Procedure Notes (Signed)
Spinal Patient location during procedure: OR Start time: 06/22/2015 8:55 AM End time: 06/22/2015 8:57 AM Staffing Anesthesiologist: Suella Broad D Performed by: anesthesiologist  Preanesthetic Checklist Completed: patient identified, site marked, surgical consent, pre-op evaluation, timeout performed, IV checked, risks and benefits discussed and monitors and equipment checked Spinal Block Patient position: sitting Prep: Betadine Patient monitoring: heart rate, continuous pulse ox, blood pressure and cardiac monitor Approach: midline Location: L4-5 Injection technique: single-shot Needle Needle type: Introducer and Sprotte  Needle gauge: 24 G Needle length: 9 cm Additional Notes Negative paresthesia. Negative blood return. Positive free-flowing CSF. Expiration date of kit checked and confirmed. Patient tolerated procedure well, without complications.

## 2015-06-23 LAB — BASIC METABOLIC PANEL
Anion gap: 11 (ref 5–15)
BUN: 16 mg/dL (ref 6–20)
CO2: 24 mmol/L (ref 22–32)
CREATININE: 0.9 mg/dL (ref 0.61–1.24)
Calcium: 8.9 mg/dL (ref 8.9–10.3)
Chloride: 101 mmol/L (ref 101–111)
GFR calc Af Amer: >60 mL/min (ref >60)
GLUCOSE: 204 mg/dL — AB (ref 65–99)
POTASSIUM: 4.2 mmol/L (ref 3.5–5.1)
SODIUM: 136 mmol/L (ref 135–145)

## 2015-06-23 LAB — CBC
HEMATOCRIT: 31.5 % — AB (ref 39.0–52.0)
Hemoglobin: 9.4 g/dL — ABNORMAL LOW (ref 13.0–17.0)
MCH: 21.5 pg — AB (ref 26.0–34.0)
MCHC: 29.8 g/dL — AB (ref 30.0–36.0)
MCV: 72.1 fL — AB (ref 78.0–100.0)
PLATELETS: 367 10*3/uL (ref 150–400)
RBC: 4.37 MIL/uL (ref 4.22–5.81)
RDW: 18.8 % — ABNORMAL HIGH (ref 11.5–15.5)
WBC: 13.1 10*3/uL — ABNORMAL HIGH (ref 4.0–10.5)

## 2015-06-23 MED ORDER — ASPIRIN 325 MG PO TBEC: 325.0000 mg | DELAYED_RELEASE_TABLET | Freq: Two times a day (BID) | ORAL | Status: AC

## 2015-06-23 MED ORDER — TIZANIDINE HCL 4 MG PO TABS: 4.0000 mg | ORAL_TABLET | Freq: Four times a day (QID) | ORAL | Status: DC | PRN

## 2015-06-23 MED ORDER — FERROUS SULFATE 325 (65 FE) MG PO TABS: 325.0000 mg | ORAL_TABLET | Freq: Three times a day (TID) | ORAL | Status: DC

## 2015-06-23 MED ORDER — DOCUSATE SODIUM 100 MG PO CAPS: 100.0000 mg | ORAL_CAPSULE | Freq: Two times a day (BID) | ORAL | Status: DC

## 2015-06-23 MED ORDER — HYDROCODONE-ACETAMINOPHEN 7.5-325 MG PO TABS: 1.0000 | ORAL_TABLET | ORAL | Status: DC | PRN

## 2015-06-23 MED ORDER — POLYETHYLENE GLYCOL 3350 17 G PO PACK: 17.0000 g | PACK | Freq: Two times a day (BID) | ORAL | Status: DC

## 2015-06-23 NOTE — Progress Notes (Signed)
OT Cancellation Note  Patient Details Name: Marvin Frost MRN: [id number] DOB: [birth year]   Cancelled Treatment:    Reason Eval/Treat Not Completed: PT screened, no needs identified, will sign off  Eren Ryser 06/23/2015, 9:11 AM  Lesle Chris, OTR/L [phone] 06/23/2015

## 2015-06-23 NOTE — Care Management Note (Addendum)
Case Management Note  Patient Details  Name: Espiridion Claeys MRN: [id number] Date of Birth: 1945/10/08  Subjective/Objective:     S/p Left total hip replacement through an anterior approach                Action/Plan: Discharge planning, no HH needs identified. No PT follow up indicated per nurse who confirmed with Rosario Adie PA.  Expected Discharge Date:                  Expected Discharge Plan:  Home/Self Care  In-House Referral:  NA  Discharge planning Services  CM Consult  Post Acute Care Choice:  NA Choice offered to:  NA  DME Arranged:  N/A DME Agency:  NA  HH Arranged:  NA HH Agency:  NA  Status of Service:  Completed, signed off  Medicare Important Message Given:    Date Medicare IM Given:    Medicare IM give by:    Date Additional Medicare IM Given:    Additional Medicare Important Message give by:     If discussed at [place] of Stay Meetings, dates discussed:    Additional Comments:  Guadalupe Maple, RN 06/23/2015, 10:52 AM

## 2015-06-23 NOTE — Progress Notes (Signed)
     Subjective: 1 Day Post-Op Procedure(s) (LRB): LEFT TOTAL HIP ARTHROPLASTY ANTERIOR APPROACH (Left)   Patient reports pain as mild, pain controlled. No events throughout the night. Ready to work with PT and get better.  Ready to be discharged home.  Objective:   VITALS:   Filed Vitals:   06/23/15 0155 06/23/15 0620  BP: 138/72 134/71  Pulse: 90 86  Temp: 98.5 F (36.9 C) 97.6 F (36.4 C)  Resp: 16 16    Dorsiflexion/Plantar flexion intact Incision: dressing C/D/I No cellulitis present Compartment soft  LABS  Recent Labs  06/23/15 0418  HGB 9.4*  HCT 31.5*  WBC 13.1*  PLT 367     Recent Labs  06/23/15 0418  NA 136  K 4.2  BUN 16  CREATININE 0.90  GLUCOSE 204*     Assessment/Plan: 1 Day Post-Op Procedure(s) (LRB): LEFT TOTAL HIP ARTHROPLASTY ANTERIOR APPROACH (Left) Foley cath d/c'ed Advance diet Up with therapy D/C IV fluids Discharge home with home health  Follow up in 2 weeks at [facility]. Follow up with OLIN,Gregg Winchell D in 2 weeks.  Contact information:  [facility] [address], [place] [id number]    Obese (BMI 30-39.9) Estimated body mass index is 34.37 kg/(m^2) as calculated from the following:   Height as of this encounter: 5\' 8"  (1.727 m).   Weight as of this encounter: 102.513 kg (226 lb). Patient also counseled that weight may inhibit the healing process Patient counseled that losing weight will help with future health issues         West Pugh. Branndon Tuite   PAC  06/23/2015, 8:30 AM

## 2015-06-23 NOTE — Progress Notes (Signed)
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  Spouse also present and educated.  Pt declined performing LE exercises however reports he remembers from previous THA and still has HEP.  Pt and spouse had no questions and feel ready for d/c.   Follow Up Recommendations  No PT follow up (per MD at f/u visit)     Equipment Recommendations  None recommended by PT    Recommendations for Other Services       Precautions / Restrictions Precautions Precautions: Fall Precaution Comments: direct anterior approach Restrictions Other Position/Activity Restrictions: WBAT    Mobility  Bed Mobility Overal bed mobility: Needs Assistance Bed Mobility: Supine to Sit     Supine to sit: Min assist     General bed mobility comments: verbal cues for self assist however pt requesting assist for L LE for better pain control  Transfers Overall transfer level: Needs assistance Equipment used: Rolling walker (2 wheeled) Transfers: Sit to/from Stand Sit to Stand: Min guard         General transfer comment: verbal cues for safe technique  Ambulation/Gait Ambulation/Gait assistance: Min guard Ambulation Distance (Feet): 80 Feet Assistive device: Rolling walker (2 wheeled) Gait Pattern/deviations: Step-through pattern;Antalgic;Decreased stance time - left     General Gait Details: verbal cues for RW positioning, posture    Stairs Stairs: Yes Stairs assistance: Min guard Stair Management: Step to pattern;Backwards;With walker Number of Stairs: 3 General stair comments: verbal cues for sequence, safety, RW positioning, and spouse present and also educated  Wheelchair Mobility    Modified Rankin (Stroke Patients Only)       Balance                                     Cognition Arousal/Alertness: Awake/alert Behavior During Therapy: WFL for tasks assessed/performed Overall Cognitive Status: Within Functional Limits for tasks assessed                      Exercises      General Comments        Pertinent Vitals/Pain Pain Assessment: 0-10 Pain Score: 3  Pain Location: L hip Pain Descriptors / Indicators: Sore;Aching Pain Intervention(s): Limited activity within patient's tolerance;Monitored during session;Repositioned    Home Living                      Prior Function            PT Goals (current goals can now be found in the care plan section) Progress towards PT goals: Progressing toward goals    Frequency  7X/week    PT Plan Current plan remains appropriate    Co-evaluation             End of Session   Activity Tolerance: Patient tolerated treatment well Patient left: in chair;with call bell/phone within reach;with family/visitor present     Time: [id number] PT Time Calculation (min) (ACUTE ONLY): 12 min  Charges:  $Gait Training: 8-22 mins                    G Codes:  Anastasha Ortez,KATHrine E 06/23/2015, 1:40 PM Carmelia Bake, PT, DPT 06/23/2015 Pager: [phone]

## 2015-06-25 NOTE — Discharge Summary (Signed)
Physician Discharge Summary  Patient ID: Marvin Frost MRN: [id number] DOB/AGE: [birth year] 69 y.o.  Admit date: 06/22/2015 Discharge date: 06/23/2015   Procedures:  Procedure(s) (LRB): LEFT TOTAL HIP ARTHROPLASTY ANTERIOR APPROACH (Left)  Attending Physician:  Dr. Paralee Cancel   Admission Diagnoses:   Left hip primary OA / pain  Discharge Diagnoses:  Principal Problem:   S/P left THA, AA  Past Medical History  Diagnosis Date  . CAD (coronary artery disease)     Nonobstructive Cath 2004  . Hypertension   . Diabetes mellitus   . Hyperlipemia   . GERD (gastroesophageal reflux disease)   . Obesity   . Pancreatitis   . Cataract   . Heart palpitations     last episode- 09/2014 seen by Dr Percival Spanish 5/16- clearance for surgery   . Arthritis   . Dysrhythmia     history of heart palpitations   . History of frequent urinary tract infections   . History of hiatal hernia   . 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 male, has a history of pain and functional disability in the left hip(s) due to arthritis and patient has failed non-surgical conservative treatments for greater than 12 weeks to include NSAID's and/or analgesics, corticosteriod injections, use of assistive devices and activity modification. Onset of symptoms was gradual starting 2+ years ago with gradually worsening course since that time.The patient noted prior procedures of the hip to include arthroplasty on the right hip(s). Patient currently rates pain in the left hip at 8 out of 10 with activity. Patient has night pain, worsening of pain with activity and weight bearing, trendelenberg gait, pain that interfers with activities of daily living and pain with passive range of motion. Patient has evidence of periarticular osteophytes and joint space narrowing by imaging studies. This condition presents safety issues increasing the risk of falls. There is no current  active infection. Risks, benefits and expectations were discussed with the patient. Risks including but not limited to the risk of anesthesia, blood clots, nerve damage, blood vessel damage, failure of the prosthesis, infection and up to and including death. Patient understand the risks, benefits and expectations and wishes to proceed with surgery.   PCP: Redge Gainer, MD   Discharged Condition: good  Hospital Course:  Patient underwent the above stated procedure on 06/22/2015. Patient tolerated the procedure well and brought to the recovery room in good condition and subsequently to the floor.  POD #1 BP: 134/71 ; Pulse: 86 ; Temp: 97.6 F (36.4 C) ; Resp: 16 Patient reports pain as mild, pain controlled. No events throughout the night. Ready to work with PT and get better. Ready to be discharged home. Dorsiflexion/plantar flexion intact, incision: dressing C/D/I, no cellulitis present and compartment soft.   LABS  Basename    HGB     9.4  HCT     31.5    Discharge Exam: General appearance: alert, cooperative and no distress Extremities: Homans sign is negative, no sign of DVT, no edema, redness or tenderness in the calves or thighs and no ulcers, gangrene or trophic changes  Disposition: Home with follow up in 2 weeks   Follow-up Information    Follow up with Mauri Pole, MD. Schedule an appointment as soon as possible for a visit in 2 weeks.   Specialty:  Orthopedic Surgery   Contact information:   [address]  [address] [place] [address] [id number]       Discharge Instructions    Call MD / Call 911    Complete by:  As directed   If you experience chest pain or shortness of breath, CALL 911 and be transported to the hospital emergency room.  If you develope a fever above 101 F, pus (white drainage) or increased drainage or redness at the wound, or calf pain, call your surgeon's office.     Change dressing    Complete by:  As directed   Maintain  surgical dressing until follow up in the clinic. If the edges start to pull up, may reinforce with tape. If the dressing is no longer working, may remove and cover with gauze and tape, but must keep the area dry and clean.  Call with any questions or concerns.     Constipation Prevention    Complete by:  As directed   Drink plenty of fluids.  Prune juice may be helpful.  You may use a stool softener, such as Colace (over the counter) 100 mg twice a day.  Use MiraLax (over the counter) for constipation as needed.     Diet - low sodium heart healthy    Complete by:  As directed      Discharge instructions    Complete by:  As directed   Maintain surgical dressing until follow up in the clinic. If the edges start to pull up, may reinforce with tape. If the dressing is no longer working, may remove and cover with gauze and tape, but must keep the area dry and clean.  Follow up in 2 weeks at [facility]. Call with any questions or concerns.     Increase activity slowly as tolerated    Complete by:  As directed   Weight bearing as tolerated with assist device (walker, cane, etc) as directed, use it as long as suggested by your surgeon or therapist, typically at least 4-6 weeks.     TED hose    Complete by:  As directed   Use stockings (TED hose) for 2 weeks on both leg(s).  You may remove them at night for sleeping.             Medication List    STOP taking these medications        aspirin 81 MG tablet  Replaced by:  aspirin 325 MG EC tablet     ferrous sulfate 325 (65 FE) MG EC tablet  Replaced by:  ferrous sulfate 325 (65 FE) MG tablet     naproxen sodium 220 MG tablet  Commonly known as:  ANAPROX      TAKE these medications        ALIGN 4 MG Caps  Take 1 capsule by mouth every morning.     aspirin 325 MG EC tablet  Take 1 tablet (325 mg total) by mouth 2 (two) times daily.     BENICAR HCT 40-25 MG tablet  Generic drug:  olmesartan-hydrochlorothiazide  TAKE 1  TABLET DAILY     CRESTOR 10 MG tablet  Generic drug:  rosuvastatin  Take 10 mg by mouth every 3 (three) days.     docusate sodium 100 MG capsule  Commonly known as:  COLACE  Take 1 capsule (100 mg total) by mouth 2 (two) times daily.     Empagliflozin-Metformin HCl 12.10-998 MG Tabs  Commonly known as:  SYNJARDY  Take 1 tablet by mouth 2 (two) times daily.  Evolocumab 140 MG/ML Soaj  Commonly known as:  REPATHA SURECLICK  Inject [id number] mg into the skin every 14 (fourteen) days. Inject subcutaneously every two weeks     ferrous sulfate 325 (65 FE) MG tablet  Take 1 tablet (325 mg total) by mouth 3 (three) times daily after meals.     freestyle lancets  Check BS BID and PRN. DX.E11.9     FREESTYLE LITE Devi  Use to check BS bid. DX. E11.9     glucose blood test strip  Commonly known as:  FREESTYLE LITE  Check BS BID and PRN. DX. E11.9     HYDROcodone-acetaminophen 7.5-325 MG tablet  Commonly known as:  NORCO  Take 1-2 tablets by mouth every 4 (four) hours as needed for moderate pain.     Insulin Pen Needle 32G X 5 MM Misc  Commonly known as:  NOVOTWIST  Use with insulin pens up to 5 times per day.dx.250.0     multivitamin capsule  Take 1 capsule by mouth daily after supper.     NOVOLOG FLEXPEN 100 UNIT/ML FlexPen  Generic drug:  insulin aspart  Inject 12 Units into the skin 2 (two) times daily.     pantoprazole 40 MG tablet  Commonly known as:  PROTONIX  Take 1 tablet (40 mg total) by mouth 2 (two) times daily.     polyethylene glycol packet  Commonly known as:  MIRALAX / GLYCOLAX  Take 17 g by mouth 2 (two) times daily.     tiZANidine 4 MG tablet  Commonly known as:  ZANAFLEX  Take 1 tablet (4 mg total) by mouth every 6 (six) hours as needed for muscle spasms.     TRESIBA FLEXTOUCH 200 UNIT/ML Sopn  Generic drug:  Insulin Degludec  Inject 60 Units into the skin daily.     Vitamin D3 5000 units Tabs  Take 1 tablet by mouth daily.          Signed: [place] Pugh. Rudi Knippenberg   PA-C  06/25/2015, 9:31 AM

## 2015-07-04 ENCOUNTER — Other Ambulatory Visit: Payer: Self-pay | Admitting: Family Medicine

## 2015-07-07 DIAGNOSIS — Z96642 Presence of left artificial hip joint: Secondary | ICD-10-CM | POA: Diagnosis not present

## 2015-07-07 DIAGNOSIS — Z471 Aftercare following joint replacement surgery: Secondary | ICD-10-CM | POA: Diagnosis not present

## 2015-07-31 NOTE — Therapy (Signed)
[facility] [place], [place], [address] Phone: [phone]   Fax:  [phone]  Physical Therapy Treatment  Patient Details  Name: Nyshaun Standage MRN: [id number] Date of Birth: 08/28/1945 No Data Recorded  Encounter Date: 09/10/2014    Past Medical History  Diagnosis Date  . CAD (coronary artery disease)     Nonobstructive Cath 2004  . Hypertension   . Diabetes mellitus   . Hyperlipemia   . GERD (gastroesophageal reflux disease)   . Obesity   . Pancreatitis   . Cataract   . Heart palpitations     last episode- 09/2014 seen by Dr Percival Spanish 5/16- clearance for surgery   . Arthritis   . Dysrhythmia     history of heart palpitations   . History of frequent urinary tract infections   . History of hiatal hernia   . 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 Right shoulder surgery    . Hydrocele excision / repair    . Colonoscopy  11/2003    diverticulosis, no polyps  . Upper gastrointestinal endoscopy  08/19/2010    hiatal hernia 2cm, mild gastritis  . Cardiac catheterization      2004 and 1999- done by Dr Percival Spanish   . Left calf surgery       related to trauma of being torn   . Left index finger arthrioscopic surgery     . Lymph node removed from right neck     . 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 Total hip arthroplasty Left 06/22/2015    Procedure: LEFT TOTAL HIP ARTHROPLASTY ANTERIOR APPROACH;  Surgeon: Paralee Cancel, MD;  Location: [facility];  Service: Orthopedics;  Laterality: Left;    There were no vitals filed for this visit.  Visit Diagnosis:  Hip pain, right                                    PT Long Term Goals - 09/10/14 0820    PT LONG TERM  GOAL #1   Title demonstrate and/or verbalize techniques to reduce the risk of re-injury to include info on: physical activity   Time 8   Period Weeks   Status Achieved   PT LONG TERM GOAL #2   Title be independent with advanced HEP   Time 8   Period Weeks   Status Achieved   PT LONG TERM GOAL #3   Title perform ADL's with pain not > 3/10   Time 8   Period Weeks   Status Achieved   PT LONG TERM GOAL #4   Title eliminate right LE symptoms   Time 8   Period Weeks   Status Achieved               Problem List Patient Active Problem List   Diagnosis Date Noted  . 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 Primary osteoarthritis of both knees 01/26/2015  . Nonspecific (abnormal) findings on radiological and other examination of gastrointestinal tract 09/27/2010  . NAUSEA AND VOMITING 08/16/2010  . Type 2 diabetes mellitus not at goal [facility]) 03/18/2009  . HYPERLIPIDEMIA 03/18/2009  . OBESITY 03/18/2009  .  Essential hypertension 03/18/2009  . Coronary atherosclerosis 03/18/2009  . [facility] 03/18/2009  . GERD 03/18/2009   PHYSICAL THERAPY DISCHARGE SUMMARY  Visits from Start of Care:  Current functional level related to goals / functional outcomes: Please see above.    Remaining deficits: None.   Education / Equipment: HEP. Plan: Patient agrees to discharge.  Patient goals were met. Patient is being discharged due to meeting the stated rehab goals.  ?????      Maven Varelas, [place] MPT 07/31/2015, 4:37 PM  [facility] [address] [place], [place], [address] Phone: [phone]   Fax:  [phone]  Name: Djuan Talton MRN: [id number] Date of Birth: [birth year]

## 2015-08-03 ENCOUNTER — Ambulatory Visit: Payer: Medicare Other | Admitting: Family Medicine

## 2015-08-04 ENCOUNTER — Ambulatory Visit (INDEPENDENT_AMBULATORY_CARE_PROVIDER_SITE_OTHER): Payer: Medicare Other | Admitting: Family Medicine

## 2015-08-04 ENCOUNTER — Encounter: Payer: Self-pay | Admitting: Family Medicine

## 2015-08-04 VITALS — BP 116/66 | HR 95 | Temp 97.1°F | Ht 68.0 in | Wt 221.0 lb

## 2015-08-04 DIAGNOSIS — Z9660 Presence of unspecified orthopedic joint implant: Secondary | ICD-10-CM

## 2015-08-04 DIAGNOSIS — N400 Enlarged prostate without lower urinary tract symptoms: Secondary | ICD-10-CM | POA: Diagnosis not present

## 2015-08-04 DIAGNOSIS — E1159 Type 2 diabetes mellitus with other circulatory complications: Secondary | ICD-10-CM | POA: Diagnosis not present

## 2015-08-04 DIAGNOSIS — K219 Gastro-esophageal reflux disease without esophagitis: Secondary | ICD-10-CM

## 2015-08-04 DIAGNOSIS — Z8679 Personal history of other diseases of the circulatory system: Secondary | ICD-10-CM | POA: Diagnosis not present

## 2015-08-04 DIAGNOSIS — E785 Hyperlipidemia, unspecified: Secondary | ICD-10-CM | POA: Diagnosis not present

## 2015-08-04 DIAGNOSIS — I10 Essential (primary) hypertension: Secondary | ICD-10-CM

## 2015-08-04 DIAGNOSIS — E1169 Type 2 diabetes mellitus with other specified complication: Secondary | ICD-10-CM

## 2015-08-04 DIAGNOSIS — R7989 Other specified abnormal findings of blood chemistry: Secondary | ICD-10-CM | POA: Diagnosis not present

## 2015-08-04 DIAGNOSIS — Z96643 Presence of artificial hip joint, bilateral: Secondary | ICD-10-CM

## 2015-08-04 LAB — POCT URINALYSIS DIPSTICK
Bilirubin, UA: NEGATIVE
Blood, UA: NEGATIVE
Glucose, UA: 200
Ketones, UA: NEGATIVE
LEUKOCYTES UA: NEGATIVE
NITRITE UA: NEGATIVE
PROTEIN UA: NEGATIVE
SPEC GRAV UA: 1.015
UROBILINOGEN UA: NEGATIVE
pH, UA: 6

## 2015-08-04 LAB — POCT UA - MICROSCOPIC ONLY
BACTERIA, U MICROSCOPIC: NEGATIVE
Casts, Ur, LPF, POC: NEGATIVE
Crystals, Ur, HPF, POC: NEGATIVE
Mucus, UA: NEGATIVE
RBC, URINE, MICROSCOPIC: NEGATIVE
WBC, UR, HPF, POC: NEGATIVE
Yeast, UA: NEGATIVE

## 2015-08-04 MED ORDER — PANTOPRAZOLE SODIUM 40 MG PO TBEC: 40.0000 mg | DELAYED_RELEASE_TABLET | Freq: Two times a day (BID) | ORAL | Status: DC

## 2015-08-04 MED ORDER — GLUCOSE BLOOD VI STRP: ORAL_STRIP | Status: DC

## 2015-08-04 MED ORDER — EMPAGLIFLOZIN-METFORMIN HCL 12.5-1000 MG PO TABS: 1.0000 | ORAL_TABLET | Freq: Two times a day (BID) | ORAL | Status: DC

## 2015-08-04 MED ORDER — ROSUVASTATIN CALCIUM 10 MG PO TABS: 10.0000 mg | ORAL_TABLET | ORAL | Status: DC

## 2015-08-04 MED ORDER — INSULIN PEN NEEDLE 32G X 5 MM MISC: Status: DC

## 2015-08-04 MED ORDER — OLMESARTAN MEDOXOMIL-HCTZ 40-25 MG PO TABS: 1.0000 | ORAL_TABLET | Freq: Every day | ORAL | Status: DC

## 2015-08-04 MED ORDER — FREESTYLE LANCETS MISC: Status: DC

## 2015-08-04 MED ORDER — INSULIN ASPART 100 UNIT/ML FLEXPEN: 12.0000 [IU] | PEN_INJECTOR | Freq: Two times a day (BID) | SUBCUTANEOUS | Status: DC

## 2015-08-04 MED ORDER — INSULIN DEGLUDEC 200 UNIT/ML ~~LOC~~ SOPN: 60.0000 [IU] | PEN_INJECTOR | Freq: Every day | SUBCUTANEOUS | Status: DC

## 2015-08-04 NOTE — Patient Instructions (Addendum)
Medicare Annual Wellness Visit  [facility] and the medical providers at [facility] strive to bring you the best medical care.  In doing so we not only want to address your current medical conditions and concerns but also to detect new conditions early and prevent illness, disease and health-related problems.    Medicare offers a yearly Wellness Visit which allows our clinical staff to assess your need for preventative services including immunizations, lifestyle education, counseling to decrease risk of preventable diseases and screening for fall risk and other medical concerns.    This visit is provided free of charge (no copay) for all Medicare recipients. The clinical pharmacists at [facility] have begun to conduct these Wellness Visits which will also include a thorough review of all your medications.    As you primary medical provider recommend that you make an appointment for your Annual Wellness Visit if you have not done so already this year.  You may set up this appointment before you leave today or you may call back [id number]) and schedule an appointment.  Please make sure when you call that you mention that you are scheduling your Annual Wellness Visit with the clinical pharmacist so that the appointment may be made for the proper length of time.     Continue current medications. Continue good therapeutic lifestyle changes which include good diet and exercise. Fall precautions discussed with patient. If an FOBT was given today- please return it to our front desk. If you are over 72 years old - you may need Prevnar 73 or the adult Pneumonia vaccine.  **Flu shots are available--- please call and schedule a FLU-CLINIC appointment**  After your visit with [place] today you will receive a survey in the mail or online from [employer] regarding your care with [place]. Please take a moment to fill this out. Your feedback is very  important to [place] as you can help [place] better understand your patient needs as well as improve your experience and satisfaction. WE CARE ABOUT YOU!!!    continue to follow-up with orthopedist and do physical therapy as he has recommended Continue to work on blood sugar and take current medications Increase exercise as tolerated and recommended by the orthopedist Follow-up with clinical pharmacy as planned

## 2015-08-04 NOTE — Progress Notes (Signed)
Subjective:    Patient ID: Marvin Frost, male    DOB: 12/24/1945, 70 y.o.   MRN: [id number]  HPI Pt here for follow up and management of chronic medical problems which includes hyperlipidemia and diabetes. He is taking medications regularly. The patient is status post bilateral hip replacements. One was done in September a 2016 and the left was done in late December 2016. It is important to note that the patient is now on a PCS K-9 inhibitor treatment. He's had years of poor response to statin drugs. He brings in blood sugars for review. These covered December and January. The blood sugar generally before breakfast are running in the 1:30 to 140 range and this seems to be the typical value since back through January. Before lunch they're blood sugars in the 1 40-160 and at dinnertime they may be as high as 240 or 254.  Initial be scanned into the record. The patient denies any chest pain or shortness of breath. He's had some problems with constipation because of pain medication from the hip replacement but this is doing better. He has not seen any blood in the stool or had any black tarry bowel movements. He is passing his water without problems. He is due to have an eye exam done. His blood sugar readings were brought in and they will be scanned into the record.      Patient Active Problem List   Diagnosis Date Noted  . 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 Primary osteoarthritis of both knees 01/26/2015  . Nonspecific (abnormal) findings on radiological and other examination of gastrointestinal tract 09/27/2010  . NAUSEA AND VOMITING 08/16/2010  . Type 2 diabetes mellitus not at goal [facility]) 03/18/2009  . HYPERLIPIDEMIA 03/18/2009  . OBESITY 03/18/2009  . Essential hypertension 03/18/2009  . Coronary atherosclerosis 03/18/2009  . [facility] 03/18/2009  . GERD 03/18/2009   Outpatient Encounter Prescriptions as of 08/04/2015  Medication Sig  . aspirin 81 MG tablet Take 81 mg by mouth daily.  Marland Kitchen BENICAR HCT  40-25 MG tablet TAKE 1 TABLET DAILY  . Cholecalciferol (VITAMIN D3) 5000 UNITS TABS Take 1 tablet by mouth daily.   . Empagliflozin-Metformin HCl (SYNJARDY) 12.10-998 MG TABS Take 1 tablet by mouth 2 (two) times daily.  . Evolocumab (REPATHA SURECLICK) XX123456 MG/ML SOAJ Inject 140 mg into the skin every 14 (fourteen) days. Inject subcutaneously every two weeks  . FREESTYLE LITE test strip USE TO CHECK BLOOD SUGAR TWICE A DAY AND AS NEEDED  . insulin aspart (NOVOLOG FLEXPEN) 100 UNIT/ML FlexPen Inject 12 Units into the skin 2 (two) times daily.   . Insulin Degludec (TRESIBA FLEXTOUCH) 200 UNIT/ML SOPN Inject 60 Units into the skin daily.   . Insulin Pen Needle (NOVOTWIST) 32G X 5 MM MISC Use with insulin pens up to 5 times per day.dx.250.0  . Lancets (FREESTYLE) lancets Check BS BID and PRN. DX.E11.9  . Multiple Vitamin (MULTIVITAMIN) capsule Take 1 capsule by mouth daily after supper.   . pantoprazole (PROTONIX) 40 MG tablet Take 1 tablet (40 mg total) by mouth 2 (two) times daily.  . Probiotic Product (ALIGN) 4 MG CAPS Take 1 capsule by mouth every morning.  . rosuvastatin (CRESTOR) 10 MG tablet Take 10 mg by mouth every 3 (three) days.   . Blood Glucose Monitoring Suppl (FREESTYLE LITE) DEVI Use to check BS bid. DX. E11.9  . docusate sodium (COLACE) 100 MG capsule Take 1 capsule (100 mg total) by mouth 2 (two) times daily.  . [  DISCONTINUED] ferrous sulfate 325 (65 FE) MG tablet Take 1 tablet (325 mg total) by mouth 3 (three) times daily after meals.  . [DISCONTINUED] HYDROcodone-acetaminophen (NORCO) 7.5-325 MG tablet Take 1-2 tablets by mouth every 4 (four) hours as needed for moderate pain.  . [DISCONTINUED] polyethylene glycol (MIRALAX / GLYCOLAX) packet Take 17 g by mouth 2 (two) times daily.  . [DISCONTINUED] tiZANidine (ZANAFLEX) 4 MG tablet Take 1 tablet (4 mg total) by mouth every 6 (six) hours as needed for muscle spasms.   No facility-administered encounter medications on file as of  08/04/2015.      Review of Systems  Constitutional: Negative.   HENT: Negative.   Eyes: Negative.   Respiratory: Negative.   Cardiovascular: Negative.   Gastrointestinal: Negative.   Endocrine: Negative.   Genitourinary: Negative.   Musculoskeletal: Positive for arthralgias (still some left hip pain- follow up with Dr Alvan Dame).  Skin: Negative.   Allergic/Immunologic: Negative.   Neurological: Negative.   Hematological: Negative.   Psychiatric/Behavioral: Negative.        Objective:   Physical Exam  Constitutional: He is oriented to person, place, and time. He appears well-developed and well-nourished.  HENT:  Head: Normocephalic and atraumatic.  Right Ear: External ear normal.  Left Ear: External ear normal.  Nose: Nose normal.  Mouth/Throat: Oropharynx is clear and moist. No oropharyngeal exudate.  Eyes: Conjunctivae and EOM are normal. Pupils are equal, round, and reactive to light. Right eye exhibits no discharge. Left eye exhibits no discharge. No scleral icterus.  Neck: Normal range of motion. Neck supple. No thyromegaly present.  Without bruits thyromegaly or anterior cervical adenopathy  Cardiovascular: Normal rate, regular rhythm and intact distal pulses.   No murmur heard. Heart has a regular rate and rhythm at 84/m  Pulmonary/Chest: Effort normal and breath sounds normal. No respiratory distress. He has no wheezes. He has no rales. He exhibits no tenderness.  Clear anteriorly and posteriorly no axillary adenopathy  Abdominal: Soft. Bowel sounds are normal. He exhibits no mass. There is no tenderness. There is no rebound and no guarding.  Obese without masses tenderness or organ enlargement or bruits  Genitourinary: Rectum normal and penis normal.  The prostate is enlarged but soft and smooth and there are no rectal masses. There are no inguinal hernias palpable. There is no inguinal adenopathy. External genitalia were within normal limits.  Musculoskeletal: Normal  range of motion. He exhibits no edema or tenderness.  The bilateral hips are healing well and the scar tissue is minimal and there is no drainage or signs of inflammation. The patient has some weakness in the left side that he is using a cane to stabilize his walking to make sure that he is not fall. He has good mobility and good range of motion.  Lymphadenopathy:    He has no cervical adenopathy.  Neurological: He is alert and oriented to person, place, and time. He has normal reflexes. No cranial nerve deficit.  Skin: Skin is warm and dry. No rash noted.  Psychiatric: He has a normal mood and affect. His behavior is normal. Judgment and thought content normal.  Nursing note and vitals reviewed.  BP 116/66 mmHg  Pulse 95  Temp(Src) 97.1 F (36.2 C) (Oral)  Ht 5\' 8"  (1.727 m)  Wt 221 lb (100.245 kg)  BMI 33.61 kg/m2        Assessment & Plan:  1. Type 2 diabetes mellitus with other circulatory complications (HCC) -Continue to work with diet and take current  insulin treatment. Increase activity as tolerated and as recommended by the orthopedic surgeon  2. Hyperlipidemia associated with type 2 diabetes mellitus ([place]) -Continue aggressive therapeutic lifestyle changes especially watching cholesterol in diet and keeping blood sugar under control  3. Low serum vitamin D -Continue current treatment pending results of lab work - VITAMIN D 25 Hydroxy (Vit-D Deficiency, Fractures)  4. BPH (benign prostatic hyperplasia) -The patient is having no symptoms with his prostate and PSA is pending- - POCT UA - Microscopic Only - POCT urinalysis dipstick - PSA, total and free  5. Gastroesophageal reflux disease, esophagitis presence not specified -Patient is having no symptoms with reflux and no complaints today.  6. History of ASCVD -He will continue to follow-up with cardiology on a yearly basis  7. Hypertension -His blood pressure is good today and he will continue with current  treatment  8. Status post bilateral hip replacements -He is recovering well from both hip replacements and we'll continue to follow-up with orthopedic surgeon  Meds ordered this encounter  Medications  . aspirin 81 MG tablet    Sig: Take 81 mg by mouth daily.  Marland Kitchen olmesartan-hydrochlorothiazide (BENICAR HCT) 40-25 MG tablet    Sig: Take 1 tablet by mouth daily.    Dispense:  90 tablet    Refill:  3  . Empagliflozin-Metformin HCl (SYNJARDY) 12.10-998 MG TABS    Sig: Take 1 tablet by mouth 2 (two) times daily.    Dispense:  180 tablet    Refill:  3    To replace InvokaMet due to insurance formulary  . pantoprazole (PROTONIX) 40 MG tablet    Sig: Take 1 tablet (40 mg total) by mouth 2 (two) times daily.    Dispense:  180 tablet    Refill:  3  . rosuvastatin (CRESTOR) 10 MG tablet    Sig: Take 1 tablet (10 mg total) by mouth every 3 (three) days.    Dispense:  30 tablet    Refill:  3  . insulin aspart (NOVOLOG FLEXPEN) 100 UNIT/ML FlexPen    Sig: Inject 12 Units into the skin 2 (two) times daily.    Dispense:  45 mL    Refill:  3  . Insulin Degludec (TRESIBA FLEXTOUCH) 200 UNIT/ML SOPN    Sig: Inject 60 Units into the skin daily.    Dispense:  60 mL    Refill:  3  . Lancets (FREESTYLE) lancets    Sig: Check BS BID and PRN. DX.E11.9    Dispense:  300 each    Refill:  3  . glucose blood (FREESTYLE LITE) test strip    Sig: USE TO CHECK BLOOD SUGAR TWICE A DAY AND AS NEEDED    Dispense:  300 each    Refill:  3    E11.9  . Insulin Pen Needle (NOVOTWIST) 32G X 5 MM MISC    Sig: Use with insulin pens up to 5 times per day.dx.E11.9    Dispense:  300 each    Refill:  3   Patient Instructions                       Medicare Annual Wellness Visit  [facility] and the medical providers at [facility] strive to bring you the best medical care.  In doing so we not only want to address your current medical conditions and concerns but also to detect new conditions  early and prevent illness, disease and health-related problems.  Medicare offers a yearly Wellness Visit which allows our clinical staff to assess your need for preventative services including immunizations, lifestyle education, counseling to decrease risk of preventable diseases and screening for fall risk and other medical concerns.    This visit is provided free of charge (no copay) for all Medicare recipients. The clinical pharmacists at [facility] have begun to conduct these Wellness Visits which will also include a thorough review of all your medications.    As you primary medical provider recommend that you make an appointment for your Annual Wellness Visit if you have not done so already this year.  You may set up this appointment before you leave today or you may call back [phone]) and schedule an appointment.  Please make sure when you call that you mention that you are scheduling your Annual Wellness Visit with the clinical pharmacist so that the appointment may be made for the proper length of time.     Continue current medications. Continue good therapeutic lifestyle changes which include good diet and exercise. Fall precautions discussed with patient. If an FOBT was given today- please return it to our front desk. If you are over 55 years old - you may need Prevnar 23 or the adult Pneumonia vaccine.  **Flu shots are available--- please call and schedule a FLU-CLINIC appointment**  After your visit with [place] today you will receive a survey in the mail or online from [employer] regarding your care with [place]. Please take a moment to fill this out. Your feedback is very important to [place] as you can help [place] better understand your patient needs as well as improve your experience and satisfaction. WE CARE ABOUT YOU!!!    continue to follow-up with orthopedist and do physical therapy as he has recommended Continue to work on blood sugar and take current  medications Increase exercise as tolerated and recommended by the orthopedist Follow-up with clinical pharmacy as planned    Arrie Senate MD

## 2015-08-05 LAB — PSA, TOTAL AND FREE
PROSTATE SPECIFIC AG, SERUM: 1.5 ng/mL (ref 0.0–4.0)
PSA FREE PCT: 29.3 %
PSA FREE: 0.44 ng/mL

## 2015-08-05 LAB — VITAMIN D 25 HYDROXY (VIT D DEFICIENCY, FRACTURES): Vit D, 25-Hydroxy: 58.3 ng/mL (ref 30.0–100.0)

## 2015-08-06 DIAGNOSIS — Z96642 Presence of left artificial hip joint: Secondary | ICD-10-CM | POA: Diagnosis not present

## 2015-08-06 DIAGNOSIS — Z96643 Presence of artificial hip joint, bilateral: Secondary | ICD-10-CM | POA: Diagnosis not present

## 2015-08-06 DIAGNOSIS — Z471 Aftercare following joint replacement surgery: Secondary | ICD-10-CM | POA: Diagnosis not present

## 2015-08-26 ENCOUNTER — Ambulatory Visit (INDEPENDENT_AMBULATORY_CARE_PROVIDER_SITE_OTHER): Payer: Medicare Other | Admitting: Family Medicine

## 2015-08-26 ENCOUNTER — Ambulatory Visit: Payer: Medicare Other | Admitting: Nurse Practitioner

## 2015-08-26 ENCOUNTER — Encounter: Payer: Self-pay | Admitting: Family Medicine

## 2015-08-26 VITALS — BP 132/82 | HR 84 | Temp 98.0°F | Ht 68.0 in | Wt 223.2 lb

## 2015-08-26 DIAGNOSIS — W57XXXA Bitten or stung by nonvenomous insect and other nonvenomous arthropods, initial encounter: Secondary | ICD-10-CM | POA: Diagnosis not present

## 2015-08-26 DIAGNOSIS — S70912A Unspecified superficial injury of left hip, initial encounter: Secondary | ICD-10-CM

## 2015-08-26 DIAGNOSIS — J019 Acute sinusitis, unspecified: Secondary | ICD-10-CM | POA: Diagnosis not present

## 2015-08-26 MED ORDER — FLUTICASONE PROPIONATE 50 MCG/ACT NA SUSP: 1.0000 | Freq: Two times a day (BID) | NASAL | Status: DC | PRN

## 2015-08-26 MED ORDER — DOXYCYCLINE HYCLATE 100 MG PO TABS: 100.0000 mg | ORAL_TABLET | Freq: Two times a day (BID) | ORAL | Status: DC

## 2015-08-26 NOTE — Progress Notes (Signed)
BP 132/82 mmHg  Pulse 84  Temp(Src) 98 F (36.7 C) (Oral)  Ht 5\' 8"  (1.727 m)  Wt 223 lb 3.2 oz (101.243 kg)  BMI 33.95 kg/m2   Subjective:    Patient ID: Marvin Frost, male    DOB: 13-Apr-1946, 70 y.o.   MRN: [id number]  HPI: Marvin Frost is a 70 y.o. male presenting on 08/26/2015 for Sinusitis; Cough, chest congestion; and Stomach pain, diarrhea   HPI Upper respiratory infection Patient is coming in today because he has been having a 2 day history of increased sinus congestion and postnasal drainage and cough and he is worried that is starting to go down into his chest like a bronchitis which he has had previously. He denies any fevers or chills or shortness of breath or wheezing. He denies any sick contacts that he knows of.  Tick bite 2 days ago he had a tick bite on his left hip and he thinks it was only and therefore an hour or 2 but is "a little bit of a red mark around the bite itself. There has no purulent drainage. He denies any myalgias or fevers or chills. The take did not look like it engorged himself.  Relevant past medical, surgical, family and social history reviewed and updated as indicated. Interim medical history since our last visit reviewed. Allergies and medications reviewed and updated.  Review of Systems  Constitutional: Negative for fever and chills.  HENT: Positive for congestion, postnasal drip, rhinorrhea, sinus pressure, sneezing and sore throat. Negative for ear discharge, ear pain and voice change.   Eyes: Negative for pain, discharge, redness and visual disturbance.  Respiratory: Negative for shortness of breath and wheezing.   Cardiovascular: Negative for chest pain and leg swelling.  Gastrointestinal: Negative for abdominal pain, diarrhea and constipation.  Genitourinary: Negative for difficulty urinating.  Musculoskeletal: Negative for back pain and gait problem.  Skin: Positive for rash.  Neurological: Negative for syncope, light-headedness and  headaches.  All other systems reviewed and are negative.   Per HPI unless specifically indicated above     Medication List       This list is accurate as of: 08/26/15  1:49 PM.  Always use your most recent med list.               ALIGN 4 MG Caps  Take 1 capsule by mouth every morning.     aspirin 81 MG tablet  Take 81 mg by mouth daily.     doxycycline 100 MG tablet  Commonly known as:  VIBRA-TABS  Take 1 tablet (100 mg total) by mouth 2 (two) times daily. 1 po bid     Empagliflozin-Metformin HCl 12.10-998 MG Tabs  Commonly known as:  SYNJARDY  Take 1 tablet by mouth 2 (two) times daily.     Evolocumab 140 MG/ML Soaj  Commonly known as:  REPATHA SURECLICK  Inject [id number] mg into the skin every 14 (fourteen) days. Inject subcutaneously every two weeks     fluticasone 50 MCG/ACT nasal spray  Commonly known as:  FLONASE  Place 1 spray into both nostrils 2 (two) times daily as needed for allergies or rhinitis.     freestyle lancets  Check BS BID and PRN. DX.E11.9     FREESTYLE LITE Devi  Use to check BS bid. DX. E11.9     glucose blood test strip  Commonly known as:  FREESTYLE LITE  USE TO CHECK BLOOD SUGAR TWICE A DAY AND AS NEEDED  insulin aspart 100 UNIT/ML FlexPen  Commonly known as:  NOVOLOG FLEXPEN  Inject 12 Units into the skin 2 (two) times daily.     Insulin Degludec 200 UNIT/ML Sopn  Commonly known as:  TRESIBA FLEXTOUCH  Inject 60 Units into the skin daily.     Insulin Pen Needle 32G X 5 MM Misc  Commonly known as:  NOVOTWIST  Use with insulin pens up to 5 times per day.dx.E11.9     multivitamin capsule  Take 1 capsule by mouth daily after supper.     olmesartan-hydrochlorothiazide 40-25 MG tablet  Commonly known as:  BENICAR HCT  Take 1 tablet by mouth daily.     pantoprazole 40 MG tablet  Commonly known as:  PROTONIX  Take 1 tablet (40 mg total) by mouth 2 (two) times daily.     rosuvastatin 10 MG tablet  Commonly known as:  CRESTOR    Take 1 tablet (10 mg total) by mouth every 3 (three) days.     Vitamin D3 5000 units Tabs  Take 1 tablet by mouth daily.          Objective:    BP 132/82 mmHg  Pulse 84  Temp(Src) 98 F (36.7 C) (Oral)  Ht 5\' 8"  (1.727 m)  Wt 223 lb 3.2 oz (101.243 kg)  BMI 33.95 kg/m2  Wt Readings from Last 3 Encounters:  08/26/15 223 lb 3.2 oz (101.243 kg)  08/04/15 221 lb (100.245 kg)  06/22/15 226 lb (102.513 kg)    Physical Exam  Constitutional: He is oriented to person, place, and time. He appears well-developed and well-nourished. No distress.  HENT:  Right Ear: Tympanic membrane, external ear and ear canal normal.  Left Ear: Tympanic membrane, external ear and ear canal normal.  Nose: Mucosal edema and rhinorrhea present. No sinus tenderness. No epistaxis. Right sinus exhibits no maxillary sinus tenderness and no frontal sinus tenderness. Left sinus exhibits no maxillary sinus tenderness and no frontal sinus tenderness.  Mouth/Throat: Uvula is midline and mucous membranes are normal. Posterior oropharyngeal edema and posterior oropharyngeal erythema present. No oropharyngeal exudate or tonsillar abscesses.  Eyes: Conjunctivae and EOM are normal. Pupils are equal, round, and reactive to light. Right eye exhibits no discharge. No scleral icterus.  Neck: Neck supple. No thyromegaly present.  Cardiovascular: Normal rate, regular rhythm, normal heart sounds and intact distal pulses.   No murmur heard. Pulmonary/Chest: Effort normal and breath sounds normal. No respiratory distress. He has no wheezes. He has no rales.  Musculoskeletal: Normal range of motion. He exhibits no edema.  Lymphadenopathy:    He has no cervical adenopathy.  Neurological: He is alert and oriented to person, place, and time. Coordination normal.  Skin: Skin is warm and dry. No rash noted. He is not diaphoretic.     Psychiatric: He has a normal mood and affect. His behavior is normal.  Nursing note and vitals  reviewed.      Assessment & Plan:   Problem List Items Addressed This Visit    None    Visit Diagnoses    Tick bite    -  Primary    Relevant Medications    doxycycline (VIBRA-TABS) 100 MG tablet    Acute rhinosinusitis        Relevant Medications    fluticasone (FLONASE) 50 MCG/ACT nasal spray    doxycycline (VIBRA-TABS) 100 MG tablet        Follow up plan: Return if symptoms worsen or fail to improve.  Counseling provided  for all of the vaccine components No orders of the defined types were placed in this encounter.    Caryl Pina, MD [facility] 08/26/2015, 1:49 PM

## 2015-11-12 ENCOUNTER — Encounter: Payer: Self-pay | Admitting: Pharmacist

## 2015-11-12 ENCOUNTER — Ambulatory Visit (INDEPENDENT_AMBULATORY_CARE_PROVIDER_SITE_OTHER): Payer: Medicare Other | Admitting: Pharmacist

## 2015-11-12 VITALS — BP 132/70 | HR 71 | Ht 68.0 in | Wt 226.0 lb

## 2015-11-12 DIAGNOSIS — Z0000 Encounter for general adult medical examination without abnormal findings: Secondary | ICD-10-CM

## 2015-11-12 DIAGNOSIS — E1159 Type 2 diabetes mellitus with other circulatory complications: Secondary | ICD-10-CM

## 2015-11-12 DIAGNOSIS — E785 Hyperlipidemia, unspecified: Secondary | ICD-10-CM

## 2015-11-12 DIAGNOSIS — E1169 Type 2 diabetes mellitus with other specified complication: Secondary | ICD-10-CM

## 2015-11-12 DIAGNOSIS — Z8679 Personal history of other diseases of the circulatory system: Secondary | ICD-10-CM | POA: Diagnosis not present

## 2015-11-12 DIAGNOSIS — D649 Anemia, unspecified: Secondary | ICD-10-CM | POA: Diagnosis not present

## 2015-11-12 DIAGNOSIS — Z1159 Encounter for screening for other viral diseases: Secondary | ICD-10-CM

## 2015-11-12 LAB — BAYER DCA HB A1C WAIVED: HB A1C (BAYER DCA - WAIVED): 8 % — ABNORMAL HIGH (ref <7.0)

## 2015-11-12 NOTE — Addendum Note (Signed)
Addended by: Cherre Robins on: 11/12/2015 11:24 AM   Modules accepted: Orders

## 2015-11-12 NOTE — Progress Notes (Signed)
Patient ID: Marvin Frost, male   DOB: 1945-08-09, 70 y.o.   MRN: [id number]    Subjective:   Marvin Frost is a 70 y.o. male who presents for a sbusequent Medicare Annual Wellness Visit.  His main health concern is that his BG has been increasing stedily over the last 1-2 months.  AM fasting 172 - 254 Prior to lunch 137-223 Prior to supper 68-250 Bedtime 127-271  Review of Systems  Review of Systems  Constitutional: Negative.   HENT: Negative.   Eyes: Negative.   Respiratory: Negative.   Cardiovascular: Negative.   Gastrointestinal: Negative.   Genitourinary: Negative.   Musculoskeletal: Positive for joint pain (especially in both hips).  Skin: Negative.   Neurological: Negative.   Endo/Heme/Allergies: Negative.   Psychiatric/Behavioral: Negative.       Current Medications (verified) Outpatient Encounter Prescriptions as of 11/12/2015  Medication Sig  . aspirin 81 MG tablet Take 81 mg by mouth daily.  . Blood Glucose Monitoring Suppl (FREESTYLE LITE) DEVI Use to check BS bid. DX. E11.9  . cetirizine (ZYRTEC) 10 MG tablet Take 10 mg by mouth at bedtime.  . Cholecalciferol (VITAMIN D3) 5000 UNITS TABS Take 1 tablet by mouth daily.   . Empagliflozin-Metformin HCl (SYNJARDY) 12.10-998 MG TABS Take 1 tablet by mouth 2 (two) times daily.  . Evolocumab (REPATHA SURECLICK) 794 MG/ML SOAJ Inject 140 mg into the skin every 14 (fourteen) days. Inject subcutaneously every two weeks  . glucose blood (FREESTYLE LITE) test strip USE TO CHECK BLOOD SUGAR TWICE A DAY AND AS NEEDED  . insulin aspart (NOVOLOG FLEXPEN) 100 UNIT/ML FlexPen Inject 12 Units into the skin 2 (two) times daily. (Patient taking differently: Inject 16 Units into the skin 2 (two) times daily. )  . Insulin Degludec (TRESIBA FLEXTOUCH) 200 UNIT/ML SOPN Inject 60 Units into the skin daily.  . Insulin Pen Needle (NOVOTWIST) 32G X 5 MM MISC Use with insulin pens up to 5 times per day.dx.E11.9  . Lancets (FREESTYLE) lancets Check BS  BID and PRN. DX.E11.9  . Multiple Vitamin (MULTIVITAMIN) capsule Take 1 capsule by mouth daily after supper.   . olmesartan-hydrochlorothiazide (BENICAR HCT) 40-25 MG tablet Take 1 tablet by mouth daily.  . pantoprazole (PROTONIX) 40 MG tablet Take 1 tablet (40 mg total) by mouth 2 (two) times daily.  . Probiotic Product (ALIGN) 4 MG CAPS Take 1 capsule by mouth every morning.  . rosuvastatin (CRESTOR) 10 MG tablet Take 1 tablet (10 mg total) by mouth every 3 (three) days.  . [DISCONTINUED] doxycycline (VIBRA-TABS) 100 MG tablet Take 1 tablet (100 mg total) by mouth 2 (two) times daily. 1 po bid (Patient not taking: Reported on 11/12/2015)  . [DISCONTINUED] fluticasone (FLONASE) 50 MCG/ACT nasal spray Place 1 spray into both nostrils 2 (two) times daily as needed for allergies or rhinitis. (Patient not taking: Reported on 11/12/2015)   No facility-administered encounter medications on file as of 11/12/2015.    Allergies (verified) Baxinets; Celebrex; Clarithromycin; Liraglutide; Statins; Toprol xl; Triplex ad; Welchol; and Norvasc   History: Past Medical History  Diagnosis Date  . CAD (coronary artery disease)     Nonobstructive Cath 2004  . Hypertension   . Diabetes mellitus   . Hyperlipemia   . GERD (gastroesophageal reflux disease)   . Obesity   . Pancreatitis   . Cataract   . Heart palpitations     last episode- 09/2014 seen by Dr Percival Spanish 5/16- clearance for surgery   . Arthritis   . Dysrhythmia  history of heart palpitations   . History of frequent urinary tract infections   . History of hiatal hernia   . 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 Right shoulder surgery    . Hydrocele excision / repair    . Colonoscopy  11/2003    diverticulosis, no polyps  . Upper gastrointestinal endoscopy  08/19/2010    hiatal hernia 2cm, mild gastritis  . Cardiac catheterization      2004 and 1999- done by  Dr Percival Spanish   . Left calf surgery       related to trauma of being torn   . Left index finger arthrioscopic surgery     . Lymph node removed from right neck     . 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 Total hip arthroplasty Left 06/22/2015    Procedure: LEFT TOTAL HIP ARTHROPLASTY ANTERIOR APPROACH;  Surgeon: Paralee Cancel, MD;  Location: [facility];  Service: Orthopedics;  Laterality: Left;   Family History  Problem Relation Age of Onset  . Heart disease Mother   . Diabetes Brother   . Heart disease Brother   . Heart attack Brother 79  . Heart attack Father 39  . GI problems Sister     torn esophagus during endoscopy   Social History   Occupational History  . 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 Smoking status: Former Smoker -- 1.00 packs/day for 12 years    Types: Cigarettes    Quit date: 02/05/1984  . Smokeless tobacco: Former Systems developer    Types: [place] date: 06/26/1978  . Alcohol Use: No  . Drug Use: No  . Sexual Activity: Yes    Do you feel safe at home?  Yes Are there smokers in your home (other than you)? No  Dietary issues and exercise activities discussed: Current Exercise Habits: The patient does not participate in regular exercise at present  Current Dietary habits:  Admits to note following CHO counting diet or paying attention to portion sizes.   Cardiac Risk Factors include: advanced age (>60mn, >>23women);diabetes mellitus;dyslipidemia;family history of premature cardiovascular disease;hypertension;male gender;obesity (BMI >30kg/m2);sedentary lifestyle  Objective:    Today's Vitals   11/12/15 0903  BP: 132/70  Pulse: 71  Height: '5\' 8"'$  (1.727 m)  Weight: 226 lb (102.513 kg)  PainSc: 2   PainLoc: Hip   Body mass index is 34.37  kg/(m^2).  A1c = 8.0% today   Activities of Daily Living In your present state of health, do you have any difficulty performing the following activities: 11/12/2015 06/22/2015  Hearing? N N  Vision? N N  Difficulty concentrating or making decisions? N N  Walking or climbing stairs? Y N  Dressing or bathing? N N  Doing errands, shopping? N N  Preparing Food and eating ? N -  Using the Toilet? N -  In the past six months, have you accidently leaked urine? N -  Do you have problems with loss of bowel control? N -  Managing your Medications? N -  Managing your Finances? N -  Housekeeping or managing your Housekeeping? N -     Depression Screen PHQ  2/9 Scores 11/12/2015 08/26/2015 08/04/2015 01/26/2015  PHQ - 2 Score 0 0 0 0  PHQ- 9 Score - - - -     Fall Risk Fall Risk  11/12/2015 08/26/2015 08/04/2015 01/26/2015 11/05/2014  Falls in the past year? _0     Cognitive Function: MMSE - Mini Mental State Exam 11/12/2015 11/05/2014  Orientation to time 5 5  Orientation to Place 5 5  Registration 3 3  Attention/ Calculation 5 5  Recall 3 3  Language- name 2 objects 2 2  Language- repeat 1 1  Language- follow 3 step command 3 3  Language- read & follow direction 1 1  Write a sentence 1 1  Copy design 1 1  Total score 30 30    Immunizations and Health Maintenance Immunization History  Administered Date(s) Administered  . Influenza Whole 02/24/2010  . Influenza, High Dose Seasonal PF 04/15/2015  . Influenza,inj,Quad PF,36+ Mos 03/26/2013, 03/30/2014  . Pneumococcal Conjugate-13 06/17/2013  . Pneumococcal Polysaccharide-23 02/24/2009  . Td 02/24/2006  . Zoster 07/27/2006   There are no preventive care reminders to display for this patient.  Patient Care Team: Chipper Herb, MD as PCP - General (Family Medicine) Hayden Pedro, MD as Consulting Physician (Ophthalmology) Paralee Cancel, MD as Consulting Physician (Orthopedic Surgery) Minus Breeding, MD as Consulting  Physician (Cardiology)  Indicate any recent Medical Services you may have received from other than [facility] providers in the past year (date may be approximate).    Assessment:    Annual Wellness Visit  Uncontrolled type 2 DM Obesity   Screening Tests Health Maintenance  Topic Date Due  . Hepatitis C Screening  02/01/2016 (Originally 1946/06/21)  . HEMOGLOBIN A1C  11/25/2015  . OPHTHALMOLOGY EXAM  11/26/2015  . INFLUENZA VACCINE  01/25/2016  . TETANUS/TDAP  02/25/2016  . FOOT EXAM  08/03/2016  . ZOSTAVAX  Completed  . PNA vac Low Risk Adult  Completed        Plan:   During the course of the visit Anthonny was educated and counseled about the following appropriate screening and preventive services:   Vaccines to include Pneumoccal, Influenza,  Td, Zostavax - vaccines are UTD  Colorectal cancer screening - FOBT and colonoscopy are UTD  Cardiovascular disease screening - EKG is UTD, sees cardiologist within then next 30 days.  Increase Tresiba by 2 units every 2 to 3 days to goal FBG in an of 150 or less.  Continue current Novolog dose of 16 to 20 units prior to meals  Glaucoma screening / Diabetic Eye Exam - UTD , next appt is 04/2016 with Dr Zigmund Daniel  Nutrition counseling - Discussed limiting high CHO and caloric foods and decreasing portion sizes.  Prostate cancer screening - UTD  Advanced Directives - UTD and on file  Physical Activity - patient has plan to start water aerobics / exercise at [place] think this is great idea and encouraged him to increase as able to goal of 150 minutes per week.   Discussed weight / BMI and goal to lose 7 to 10 % of weight (to start)  Orders Placed This Encounter  Procedures  . CMP14+EGFR  . Bayer DCA Hb A1c Waived  . Lipid panel  . CBC with Differential/Platelet  . Hepatitis C antibody       Patient Instructions (the written plan) were given to the patient.   Cherre Robins, [facility]   11/12/2015

## 2015-11-12 NOTE — Patient Instructions (Addendum)
[name] , Thank you for taking time to come for your Medicare Wellness Visit. I appreciate your ongoing commitment to your health goals. Please review the following plan we discussed and let me know if I can assist you in the future.   These are the goals we discussed: Increase Tresiba by 2 units every 2 to 3 days until am fasting blood glucose is less than 150 or if you have a low (less than 70) during the day go back to previous day's dose.  Increase exercise - start with water exercises about 15 minutes a day and work up to 30 to 45 minutes Goal weight loss is 7 - 10% (= 15 to 22 lbs)   Increase non-starchy vegetables - carrots, green bean, squash, zucchini, tomatoes, onions, peppers, spinach and other green leafy vegetables, cabbage, lettuce, cucumbers, asparagus, okra (not fried), eggplant Limit sugar and processed foods (cakes, cookies, ice cream, crackers and chips) Increase fresh fruit but limit serving sizes 1/2 cup or about the size of tennis or baseball Limit red meat to no more than 1-2 times per week (serving size about the size of your palm) Choose whole grains / lean proteins - whole wheat bread, quinoa, whole grain rice (1/2 cup), fish, chicken, [place] Avoid sugar and calorie containing beverages - soda, sweet tea and juice.  Choose water or unsweetened tea instead.    This is a list of the screening recommended for you and due dates:  Health Maintenance  Topic Date Due  .  Hepatitis C: One time screening is recommended by Center for Disease Control  (CDC) for  adults born from [birth year] through 1965.   Checked today  . Hemoglobin A1C  Checked today  . Eye exam for diabetics  11/26/2015  . Flu Shot  01/25/2016  . Tetanus Vaccine  02/25/2016  . Complete foot exam   08/03/2016  . Shingles Vaccine  Completed  . Pneumonia vaccines  Completed  *Topic was postponed. The date shown is not the original due date.    Health Maintenance, Male A healthy lifestyle and preventative  care can promote health and wellness.  Maintain regular health, dental, and eye exams.  Eat a healthy diet. Foods like vegetables, fruits, whole grains, low-fat dairy products, and lean protein foods contain the nutrients you need and are low in calories. Decrease your intake of foods high in solid fats, added sugars, and salt. Get information about a proper diet from your health care provider, if necessary.  Regular physical exercise is one of the most important things you can do for your health. Most adults should get at least 150 minutes of moderate-intensity exercise (any activity that increases your heart rate and causes you to sweat) each week. In addition, most adults need muscle-strengthening exercises on 2 or more days a week.   Maintain a healthy weight. The body mass index (BMI) is a screening tool to identify possible weight problems. It provides an estimate of body fat based on height and weight. Your health care provider can find your BMI and can help you achieve or maintain a healthy weight. For males 20 years and older:  A BMI below 18.5 is considered underweight.  A BMI of 18.5 to 24.9 is normal.  A BMI of 25 to 29.9 is considered overweight.  A BMI of 30 and above is considered obese.  Maintain normal blood lipids and cholesterol by exercising and minimizing your intake of saturated fat. Eat a balanced diet with plenty of fruits  and vegetables. Blood tests for lipids and cholesterol should begin at age 28 and be repeated every 5 years. If your lipid or cholesterol levels are high, you are over age 42, or you are at high risk for heart disease, you may need your cholesterol levels checked more frequently.Ongoing high lipid and cholesterol levels should be treated with medicines if diet and exercise are not working.  If you smoke, find out from your health care provider how to quit. If you do not use tobacco, do not start.  Lung cancer screening is recommended for adults aged  37-80 years who are at high risk for developing lung cancer because of a history of smoking. A yearly low-dose CT scan of the lungs is recommended for people who have at least a 30-pack-year history of smoking and are current smokers or have quit within the past 15 years. A pack year of smoking is smoking an average of 1 pack of cigarettes a day for 1 year (for example, a 30-pack-year history of smoking could mean smoking 1 pack a day for 30 years or 2 packs a day for 15 years). Yearly screening should continue until the smoker has stopped smoking for at least 15 years. Yearly screening should be stopped for people who develop a health problem that would prevent them from having lung cancer treatment.  If you choose to drink alcohol, do not have more than 2 drinks per day. One drink is considered to be 12 oz (360 mL) of beer, 5 oz (150 mL) of wine, or 1.5 oz (45 mL) of liquor.  Avoid the use of street drugs. Do not share needles with anyone. Ask for help if you need support or instructions about stopping the use of drugs.  High blood pressure causes heart disease and increases the risk of stroke. High blood pressure is more likely to develop in:  People who have blood pressure in the end of the normal range (100-139/85-89 mm Hg).  People who are overweight or obese.  People who are African American.  If you are 36-51 years of age, have your blood pressure checked every 3-5 years. If you are 49 years of age or older, have your blood pressure checked every year. You should have your blood pressure measured twice--once when you are at a hospital or clinic, and once when you are not at a hospital or clinic. Record the average of the two measurements. To check your blood pressure when you are not at a hospital or clinic, you can use:  An automated blood pressure machine at a pharmacy.  A home blood pressure monitor.  If you are 12-81 years old, ask your health care provider if you should take aspirin  to prevent heart disease.  Diabetes screening involves taking a blood sample to check your fasting blood sugar level. This should be done once every 3 years after age 36 if you are at a normal weight and without risk factors for diabetes. Testing should be considered at a younger age or be carried out more frequently if you are overweight and have at least 1 risk factor for diabetes.  Colorectal cancer can be detected and often prevented. Most routine colorectal cancer screening begins at the age of 33 and continues through age 58. However, your health care provider may recommend screening at an earlier age if you have risk factors for colon cancer. On a yearly basis, your health care provider may provide home test kits to check for hidden blood in  the stool. A small camera at the end of a tube may be used to directly examine the colon (sigmoidoscopy or colonoscopy) to detect the earliest forms of colorectal cancer. Talk to your health care provider about this at age 33 when routine screening begins. A direct exam of the colon should be repeated every 5-10 years through age 47, unless early forms of precancerous polyps or small growths are found.  People who are at an increased risk for hepatitis B should be screened for this virus. You are considered at high risk for hepatitis B if:  You were born in a country where hepatitis B occurs often. Talk with your health care provider about which countries are considered high risk.  Your parents were born in a high-risk country and you have not received a shot to protect against hepatitis B (hepatitis B vaccine).  You have HIV or AIDS.  You use needles to inject street drugs.  You live with, or have sex with, someone who has hepatitis B.  You are a man who has sex with other men (MSM).  You get hemodialysis treatment.  You take certain medicines for conditions like cancer, organ transplantation, and autoimmune conditions.  Hepatitis C blood testing  is recommended for all people born from 18 through 1965 and any individual with known risk factors for hepatitis C.  Healthy men should no longer receive prostate-specific antigen (PSA) blood tests as part of routine cancer screening. Talk to your health care provider about prostate cancer screening.  Testicular cancer screening is not recommended for adolescents or adult males who have no symptoms. Screening includes self-exam, a health care provider exam, and other screening tests. Consult with your health care provider about any symptoms you have or any concerns you have about testicular cancer.  Practice safe sex. Use condoms and avoid high-risk sexual practices to reduce the spread of sexually transmitted infections (STIs).  You should be screened for STIs, including gonorrhea and chlamydia if:  You are sexually active and are younger than 24 years.  You are older than 24 years, and your health care provider tells you that you are at risk for this type of infection.  Your sexual activity has changed since you were last screened, and you are at an increased risk for chlamydia or gonorrhea. Ask your health care provider if you are at risk.  If you are at risk of being infected with HIV, it is recommended that you take a prescription medicine daily to prevent HIV infection. This is called pre-exposure prophylaxis (PrEP). You are considered at risk if:  You are a man who has sex with other men (MSM).  You are a heterosexual man who is sexually active with multiple partners.  You take drugs by injection.  You are sexually active with a partner who has HIV.  Talk with your health care provider about whether you are at high risk of being infected with HIV. If you choose to begin PrEP, you should first be tested for HIV. You should then be tested every 3 months for as long as you are taking PrEP.  Use sunscreen. Apply sunscreen liberally and repeatedly throughout the day. You should seek  shade when your shadow is shorter than you. Protect yourself by wearing long sleeves, pants, a wide-brimmed hat, and sunglasses year round whenever you are outdoors.  Tell your health care provider of new moles or changes in moles, especially if there is a change in shape or color. Also, tell your health  care provider if a mole is larger than the size of a pencil eraser.  A one-time screening for abdominal aortic aneurysm (AAA) and surgical repair of large AAAs by ultrasound is recommended for men aged 38-75 years who are current or former smokers.  Stay current with your vaccines (immunizations).   This information is not intended to replace advice given to you by your health care provider. Make sure you discuss any questions you have with your health care provider.   Document Released: 12/09/2007 Document Revised: 07/03/2014 Document Reviewed: 11/07/2010 Elsevier Interactive Patient Education [employer].

## 2015-11-13 LAB — CMP14+EGFR
ALBUMIN: 4.2 g/dL (ref 3.5–4.8)
ALT: 11 IU/L (ref 0–44)
AST: 15 IU/L (ref 0–40)
Albumin/Globulin Ratio: 1.9 (ref 1.2–2.2)
Alkaline Phosphatase: 59 IU/L (ref 39–117)
BUN / CREAT RATIO: 11 (ref 10–24)
BUN: 11 mg/dL (ref 8–27)
Bilirubin Total: 0.3 mg/dL (ref 0.0–1.2)
CO2: 19 mmol/L (ref 18–29)
CREATININE: 0.98 mg/dL (ref 0.76–1.27)
Calcium: 8.8 mg/dL (ref 8.6–10.2)
Chloride: 99 mmol/L (ref 96–106)
GFR, EST AFRICAN AMERICAN: 89 mL/min/{1.73_m2} (ref >59)
GFR, EST NON AFRICAN AMERICAN: 77 mL/min/{1.73_m2} (ref >59)
GLOBULIN, TOTAL: 2.2 g/dL (ref 1.5–4.5)
GLUCOSE: 170 mg/dL — AB (ref 65–99)
Potassium: 4.8 mmol/L (ref 3.5–5.2)
Sodium: 141 mmol/L (ref 134–144)
TOTAL PROTEIN: 6.4 g/dL (ref 6.0–8.5)

## 2015-11-13 LAB — CBC WITH DIFFERENTIAL/PLATELET
BASOS ABS: 0.2 10*3/uL (ref 0.0–0.2)
Basos: 2 %
EOS (ABSOLUTE): 0.5 10*3/uL — AB (ref 0.0–0.4)
Eos: 7 %
HEMOGLOBIN: 8.6 g/dL — AB (ref 12.6–17.7)
Hematocrit: 31.9 % — ABNORMAL LOW (ref 37.5–51.0)
Immature Grans (Abs): 0 10*3/uL (ref 0.0–0.1)
Immature Granulocytes: 0 %
LYMPHS ABS: 1.5 10*3/uL (ref 0.7–3.1)
Lymphs: 18 %
MCH: 17.1 pg — AB (ref 26.6–33.0)
MCHC: 27 g/dL — AB (ref 31.5–35.7)
MCV: 63 fL — ABNORMAL LOW (ref 79–97)
MONOCYTES: 7 %
Monocytes Absolute: 0.6 10*3/uL (ref 0.1–0.9)
NEUTROS ABS: 5.5 10*3/uL (ref 1.4–7.0)
Neutrophils: 66 %
PLATELETS: 529 10*3/uL — AB (ref 150–379)
RBC: 5.03 x10E6/uL (ref 4.14–5.80)
RDW: 20.9 % — ABNORMAL HIGH (ref 12.3–15.4)
WBC: 8.2 10*3/uL (ref 3.4–10.8)

## 2015-11-13 LAB — LIPID PANEL
CHOL/HDL RATIO: 2.9 ratio (ref 0.0–5.0)
Cholesterol, Total: 137 mg/dL (ref 100–199)
HDL: 47 mg/dL (ref >39)
LDL CALC: 51 mg/dL (ref 0–99)
Triglycerides: 197 mg/dL — ABNORMAL HIGH (ref 0–149)
VLDL CHOLESTEROL CAL: 39 mg/dL (ref 5–40)

## 2015-11-13 LAB — HEPATITIS C ANTIBODY

## 2015-11-16 ENCOUNTER — Other Ambulatory Visit: Payer: Self-pay | Admitting: Pharmacist Clinician (PhC)/ Clinical Pharmacy Specialist

## 2015-11-16 ENCOUNTER — Other Ambulatory Visit: Payer: Medicare Other

## 2015-11-16 DIAGNOSIS — Z79899 Other long term (current) drug therapy: Secondary | ICD-10-CM

## 2015-11-16 DIAGNOSIS — Z1211 Encounter for screening for malignant neoplasm of colon: Secondary | ICD-10-CM

## 2015-11-17 LAB — CBC WITH DIFFERENTIAL/PLATELET
Basophils Absolute: 0.2 10*3/uL (ref 0.0–0.2)
Basos: 2 %
EOS (ABSOLUTE): 0.5 10*3/uL — ABNORMAL HIGH (ref 0.0–0.4)
Eos: 5 %
Hematocrit: 33.3 % — ABNORMAL LOW (ref 37.5–51.0)
Hemoglobin: 9.1 g/dL — ABNORMAL LOW (ref 12.6–17.7)
Immature Grans (Abs): 0.1 10*3/uL (ref 0.0–0.1)
Immature Granulocytes: 1 %
Lymphocytes Absolute: 1.4 10*3/uL (ref 0.7–3.1)
Lymphs: 15 %
MCH: 17.3 pg — ABNORMAL LOW (ref 26.6–33.0)
MCHC: 27.3 g/dL — ABNORMAL LOW (ref 31.5–35.7)
MCV: 63 fL — ABNORMAL LOW (ref 79–97)
Monocytes Absolute: 0.6 10*3/uL (ref 0.1–0.9)
Monocytes: 7 %
Neutrophils Absolute: 6.6 10*3/uL (ref 1.4–7.0)
Neutrophils: 70 %
Platelets: 513 10*3/uL — ABNORMAL HIGH (ref 150–379)
RBC: 5.25 x10E6/uL (ref 4.14–5.80)
RDW: 21.4 % — ABNORMAL HIGH (ref 12.3–15.4)
WBC: 9.3 10*3/uL (ref 3.4–10.8)

## 2015-11-18 ENCOUNTER — Other Ambulatory Visit: Payer: Self-pay | Admitting: Pharmacist

## 2015-11-18 ENCOUNTER — Telehealth: Payer: Self-pay | Admitting: Pharmacist

## 2015-11-18 DIAGNOSIS — D649 Anemia, unspecified: Secondary | ICD-10-CM

## 2015-11-18 LAB — FECAL OCCULT BLOOD, IMMUNOCHEMICAL: FECAL OCCULT BLD: NEGATIVE

## 2015-11-18 NOTE — Telephone Encounter (Signed)
Patient given results of FOBT and CBC.  Instructed to continue iron supplementation and recheck HBG in 2 weeks.

## 2015-11-28 NOTE — Progress Notes (Signed)
HPI The patient presents for follow up of nonobstructive CAD.  His last cath was 2004. His last stress test 2013.   Since I saw him he had both hips replaced.  He did well with this with a reasonable recovery.  He did have progressive DOE.  He was found to have a significant anemia and has had this treated with iron.  With this his dyspnea has improved.  The patient denies any new symptoms such as chest discomfort, neck or arm discomfort. There has been no new shortness of breath, PND or orthopnea. There have been no reported palpitations, presyncope or syncope.   Allergies  Allergen Reactions  . Baxinets [Cetylpyridinium-Benzocaine]     unknown  . Celebrex [Celecoxib] Nausea Only  . Clarithromycin     unkown  . Liraglutide Other (See Comments)    Abdominal pain and enlarged pancreas on CT ? pancreatitis  . Statins Other (See Comments)    Unknown- muscle and joint pain if taken for long periods of time, takes Crestor every 2-3 days to avoid this reaction  . Toprol Xl [Metoprolol Succinate] Other (See Comments)    Unknown   . Triplex Ad Other (See Comments)    Abdominal pain and enlarged pancreas on CT? pancreatitis  . Welchol [Colesevelam Hcl] Other (See Comments)    constipation  . Norvasc [Amlodipine Besylate] Rash    Current Outpatient Prescriptions  Medication Sig Dispense Refill  . aspirin 81 MG tablet Take 81 mg by mouth daily.    . Blood Glucose Monitoring Suppl (FREESTYLE LITE) DEVI Use to check BS bid. DX. E11.9 1 each 0  . cetirizine (ZYRTEC) 10 MG tablet Take 10 mg by mouth as needed.     . Cholecalciferol (VITAMIN D3) 5000 UNITS TABS Take 1 tablet by mouth daily.     . Empagliflozin-Metformin HCl (SYNJARDY) 12.10-998 MG TABS Take 1 tablet by mouth 2 (two) times daily. 180 tablet 3  . Evolocumab (REPATHA SURECLICK) XX123456 MG/ML SOAJ Inject 140 mg into the skin every 14 (fourteen) days. Inject subcutaneously every two weeks 6 pen 3  . ferrous sulfate 325 (65 FE) MG EC tablet  Take 325 mg by mouth 2 (two) times daily.    Marland Kitchen glucose blood (FREESTYLE LITE) test strip USE TO CHECK BLOOD SUGAR TWICE A DAY AND AS NEEDED 300 each 3  . insulin aspart (NOVOLOG FLEXPEN) 100 UNIT/ML FlexPen Inject 16-20 Units into the skin 2 (two) times daily. 45 mL 3  . Insulin Degludec (TRESIBA FLEXTOUCH) 200 UNIT/ML SOPN Inject 60-70 Units into the skin daily. 60 mL 3  . Insulin Pen Needle (NOVOTWIST) 32G X 5 MM MISC Use with insulin pens up to 5 times per day.dx.E11.9 300 each 3  . Lancets (FREESTYLE) lancets Check BS BID and PRN. DX.E11.9 300 each 3  . Multiple Vitamin (MULTIVITAMIN) capsule Take 1 capsule by mouth daily after supper.     . olmesartan-hydrochlorothiazide (BENICAR HCT) 40-25 MG tablet Take 1 tablet by mouth daily. 90 tablet 3  . pantoprazole (PROTONIX) 40 MG tablet Take 1 tablet (40 mg total) by mouth 2 (two) times daily. 180 tablet 3  . Probiotic Product (ALIGN) 4 MG CAPS Take 1 capsule by mouth every morning.    . rosuvastatin (CRESTOR) 10 MG tablet Take 1 tablet (10 mg total) by mouth every 3 (three) days. 30 tablet 3   No current facility-administered medications for this visit.    Past Medical History  Diagnosis Date  . CAD (coronary artery disease)  Nonobstructive Cath 2004  . Hypertension   . Diabetes mellitus   . Hyperlipemia   . GERD (gastroesophageal reflux disease)   . Obesity   . Pancreatitis   . Cataract   . Heart palpitations     last episode- 09/2014 seen by Dr Percival Spanish 5/16- clearance for surgery   . Arthritis   . Dysrhythmia     history of heart palpitations   . History of frequent urinary tract infections   . History of hiatal hernia   . 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 Right shoulder surgery    . Hydrocele excision / repair    . Colonoscopy  11/2003    diverticulosis, no polyps  . Upper gastrointestinal endoscopy  08/19/2010    hiatal hernia 2cm,  mild gastritis  . Cardiac catheterization      2004 and 1999- done by Dr Percival Spanish   . Left calf surgery       related to trauma of being torn   . Left index finger arthrioscopic surgery     . Lymph node removed from right neck     . 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 Total hip arthroplasty Left 06/22/2015    Procedure: LEFT TOTAL HIP ARTHROPLASTY ANTERIOR APPROACH;  Surgeon: Paralee Cancel, MD;  Location: [facility];  Service: Orthopedics;  Laterality: Left;    ROS:  Feet cramping.  Otherwise as stated in the HPI and negative for all other systems.  PHYSICAL EXAM BP 120/68 mmHg  Pulse 91  Ht 5\' 8"  (1.727 m)  Wt 227 lb (102.967 kg)  BMI 34.52 kg/m2  SpO2 98% GENERAL:  Well appearing NECK:  No jugular venous distention, waveform within normal limits, carotid upstroke brisk and symmetric, no bruits, no thyromegaly LUNGS:  Clear to auscultation bilaterally CHEST:  Unremarkable HEART:  PMI not displaced or sustained,S1 and S2 within normal limits, no S3, no S4, no clicks, no rubs, no murmurs ABD:  Flat, positive bowel sounds normal in frequency in pitch, no bruits, no rebound, no guarding, no midline pulsatile mass, no hepatomegaly, no splenomegaly, obese EXT:  2 plus pulses throughout, no edema, no cyanosis no clubbing  EKG: Normal sinus rhythm, rate 91, left axis views poor anterior R-wave progression, T-wave changes.  11/29/2015   Lab Results  Component Value Date   CHOL 137 11/12/2015   TRIG 197* 11/12/2015   HDL 47 11/12/2015   LDLCALC 51 11/12/2015   LDLDIRECT 75 11/16/2014   Lab Results  Component Value Date   HGBA1C 8.0 05/27/2015     ASSESSMENT AND PLAN  CAD -  The patient has no new sypmtoms.  No further cardiovascular testing is indicated.  We will continue with aggressive risk reduction and meds as  listed.  HYPERLIPIDEMIA -  This is now being treated with Repatha and followed by Dr. Laurance Flatten.  HYPERTENSION -  The blood pressure is at target. No change in medications is indicated. We will continue with therapeutic lifestyle changes (TLC).  DM -  His A1C was 8.0  We discussed this.  He thinks he can get this down now that he can exercise.

## 2015-11-29 ENCOUNTER — Encounter: Payer: Self-pay | Admitting: Cardiology

## 2015-11-29 ENCOUNTER — Other Ambulatory Visit: Payer: Medicare Other

## 2015-11-29 ENCOUNTER — Ambulatory Visit (INDEPENDENT_AMBULATORY_CARE_PROVIDER_SITE_OTHER): Payer: Medicare Other | Admitting: Cardiology

## 2015-11-29 VITALS — BP 120/68 | HR 91 | Ht 68.0 in | Wt 227.0 lb

## 2015-11-29 DIAGNOSIS — I10 Essential (primary) hypertension: Secondary | ICD-10-CM

## 2015-11-29 DIAGNOSIS — D649 Anemia, unspecified: Secondary | ICD-10-CM

## 2015-11-29 NOTE — Patient Instructions (Signed)
Your physician wants you to follow-up in: 1 Year. You will receive a reminder letter in the mail two months in advance. If you don't receive a letter, please call our office to schedule the follow-up appointment.  

## 2015-12-14 ENCOUNTER — Encounter: Payer: Self-pay | Admitting: Pharmacist

## 2015-12-14 ENCOUNTER — Ambulatory Visit (INDEPENDENT_AMBULATORY_CARE_PROVIDER_SITE_OTHER): Payer: Medicare Other | Admitting: Pharmacist

## 2015-12-14 VITALS — BP 124/72 | HR 72 | Ht 68.0 in | Wt 225.0 lb

## 2015-12-14 DIAGNOSIS — E669 Obesity, unspecified: Secondary | ICD-10-CM | POA: Diagnosis not present

## 2015-12-14 DIAGNOSIS — E1159 Type 2 diabetes mellitus with other circulatory complications: Secondary | ICD-10-CM | POA: Diagnosis not present

## 2015-12-14 DIAGNOSIS — D649 Anemia, unspecified: Secondary | ICD-10-CM

## 2015-12-14 NOTE — Progress Notes (Signed)
Patient ID: Marvin Frost, male   DOB: [birth year], 70 y.o.   MRN: [id number]    Subjective:   Marvin Frost is a 70 y.o. male who presents for follow up of diabetes, obesity and anemia.  Marvin Frost was seen last about 1 month ago for an annual wellness visit.  At that time we adjusted his Tresiba to 62 units and he was advised to increase by 2 unit every 2 days to goal of FBG in am of 150 or less.  He is current inejcting 68 units of Tresiba and most days BG is less than 150 in am.   He also takes Synjardy BID and Novolog 12 units 2 to 3 times daily before meals.    AM fasting 122 - 220 (this is down from 170's to 250's) Prior to lunch 126-199 Prior to supper 115-165 Bedtime 125 - 219  We also discussed increasing exercise and limiting high calories foods at our last visit.  He have set a goal of decreasing weight by 7% or about 15# to start.  Patient has not started regular exercise but he has made some small adjustments to his diet. He has last 1# since our last visit.   Also at the last visit I checked  CBC.  Patient was found to have a low hemoglobin.  He was started on OTC iron supplementation bid.  He has tolerated well and last HBG was 10.5 (11/29/2015) which was up from 8.6 (11/12/2015 prior to supplementation.  Review of Systems  Review of Systems  Constitutional: Negative.   HENT: Negative.   Eyes: Negative.   Respiratory: Negative.   Cardiovascular: Negative.   Gastrointestinal: Negative.   Genitourinary: Negative.   Musculoskeletal: Positive for joint pain (especially in both hips).  Skin: Negative.   Neurological: Negative.   Endo/Heme/Allergies: Negative.   Psychiatric/Behavioral: Negative.       Current Medications (verified) Outpatient Encounter Prescriptions as of 12/14/2015  Medication Sig  . aspirin 81 MG tablet Take 81 mg by mouth daily.  . Blood Glucose Monitoring Suppl (FREESTYLE LITE) DEVI Use to check BS bid. DX. E11.9  . cetirizine (ZYRTEC) 10 MG tablet Take  10 mg by mouth as needed.   . Cholecalciferol (VITAMIN D3) 5000 UNITS TABS Take 1 tablet by mouth daily.   . Empagliflozin-Metformin HCl (SYNJARDY) 12.10-998 MG TABS Take 1 tablet by mouth 2 (two) times daily.  . Evolocumab (REPATHA SURECLICK) XX123456 MG/ML SOAJ Inject 140 mg into the skin every 14 (fourteen) days. Inject subcutaneously every two weeks  . ferrous sulfate 325 (65 FE) MG EC tablet Take 325 mg by mouth 2 (two) times daily.  Marland Kitchen glucose blood (FREESTYLE LITE) test strip USE TO CHECK BLOOD SUGAR TWICE A DAY AND AS NEEDED  . insulin aspart (NOVOLOG FLEXPEN) 100 UNIT/ML FlexPen Inject 16-20 Units into the skin 2 (two) times daily.  . Insulin Degludec (TRESIBA FLEXTOUCH) 200 UNIT/ML SOPN Inject 60-70 Units into the skin daily.  . Insulin Pen Needle (NOVOTWIST) 32G X 5 MM MISC Use with insulin pens up to 5 times per day.dx.E11.9  . Lancets (FREESTYLE) lancets Check BS BID and PRN. DX.E11.9  . Multiple Vitamin (MULTIVITAMIN) capsule Take 1 capsule by mouth daily after supper.   . olmesartan-hydrochlorothiazide (BENICAR HCT) 40-25 MG tablet Take 1 tablet by mouth daily.  . pantoprazole (PROTONIX) 40 MG tablet Take 1 tablet (40 mg total) by mouth 2 (two) times daily.  . Probiotic Product (ALIGN) 4 MG CAPS Take 1 capsule by mouth every  morning.  . rosuvastatin (CRESTOR) 10 MG tablet Take 1 tablet (10 mg total) by mouth every 3 (three) days.   No facility-administered encounter medications on file as of 12/14/2015.    Allergies (verified) Baxinets; Celebrex; Clarithromycin; Liraglutide; Statins; Toprol xl; Triplex ad; Welchol; and Norvasc   History: Past Medical History  Diagnosis Date  . CAD (coronary artery disease)     Nonobstructive Cath 2004  . Hypertension   . Diabetes mellitus   . Hyperlipemia   . GERD (gastroesophageal reflux disease)   . Obesity   . Pancreatitis   . Cataract   . Heart palpitations     last episode- 09/2014 seen by Dr Percival Spanish 5/16- clearance for surgery   .  Arthritis   . Dysrhythmia     history of heart palpitations   . History of frequent urinary tract infections   . History of hiatal hernia   . 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 Right shoulder surgery    . Hydrocele excision / repair    . Colonoscopy  11/2003    diverticulosis, no polyps  . Upper gastrointestinal endoscopy  08/19/2010    hiatal hernia 2cm, mild gastritis  . Cardiac catheterization      2004 and 1999- done by Dr Percival Spanish   . Left calf surgery       related to trauma of being torn   . Left index finger arthrioscopic surgery     . Lymph node removed from right neck     . 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 Total hip arthroplasty Left 06/22/2015    Procedure: LEFT TOTAL HIP ARTHROPLASTY ANTERIOR APPROACH;  Surgeon: Paralee Cancel, MD;  Location: [facility];  Service: Orthopedics;  Laterality: Left;   Family History  Problem Relation Age of Onset  . Heart disease Mother   . Diabetes Brother   . Heart disease Brother   . Heart attack Brother 52  . Heart attack Father 65  . GI problems Sister     torn esophagus during endoscopy      Objective:    Today's Vitals   12/14/15 0901  BP: 124/72  Pulse: 72  Height: 5\' 8"  (1.727 m)  Weight: 225 lb (102.059 kg)   Body mass index is 34.22 kg/(m^2).  A1c = 8.0% (10/2015)    Depression Screen PHQ 2/9 Scores 12/14/2015 11/12/2015 08/26/2015 08/04/2015  PHQ - 2 Score 0 0 0 0  PHQ- 9 Score - - - -         Assessment:     Uncontrolled type 2 DM Obesity Anemia      Plan:   1. Continue Tresiba u-200 - Inject 68 units daily.  2. Continue Synjardy 2.5/1000mg  1 tablet bid 3. Continue Novolog 12 units with breakfast, 14 units with lunch and 14 units with supper.  3.  Continue iron supplement bid for now.  If Hbg is WNL when will change to QD 4.Nutrition counseling - Discussed limiting high CHO and caloric foods and decreasing portion sizes. 5.  Again encouraged increasing physical activity - patient has plan to start water aerobics / exercise at [facility]. Goal is 150 minutes per week.  6.  RTC in August to see PCP and October  to see me.   Orders Placed This Encounter  Procedures  . CBC with Differential/Platelet       Patient Instructions (the written plan) were given to the patient.   Cherre Robins, [facility]   12/14/2015

## 2015-12-15 LAB — CBC WITH DIFFERENTIAL/PLATELET
BASOS: 3 %
Basophils Absolute: 0.2 10*3/uL (ref 0.0–0.2)
EOS (ABSOLUTE): 1 10*3/uL — ABNORMAL HIGH (ref 0.0–0.4)
EOS: 12 %
HEMATOCRIT: 37.6 % (ref 37.5–51.0)
HEMOGLOBIN: 10.5 g/dL — AB (ref 12.6–17.7)
IMMATURE GRANS (ABS): 0 10*3/uL (ref 0.0–0.1)
Immature Granulocytes: 0 %
LYMPHS ABS: 1.4 10*3/uL (ref 0.7–3.1)
Lymphs: 18 %
MCH: 19.2 pg — ABNORMAL LOW (ref 26.6–33.0)
MCHC: 27.9 g/dL — ABNORMAL LOW (ref 31.5–35.7)
MCV: 69 fL — AB (ref 79–97)
MONOCYTES: 5 %
Monocytes Absolute: 0.4 10*3/uL (ref 0.1–0.9)
NEUTROS ABS: 4.9 10*3/uL (ref 1.4–7.0)
Neutrophils: 62 %
PLATELETS: 427 10*3/uL — AB (ref 150–379)
RBC: 5.48 x10E6/uL (ref 4.14–5.80)
RDW: 28 % — ABNORMAL HIGH (ref 12.3–15.4)
WBC: 7.9 10*3/uL (ref 3.4–10.8)

## 2016-01-05 LAB — FINGERSTICK HEMOGLOBIN: HEMOGLOBIN: 10.5 g/dL — AB (ref 12.6–17.7)

## 2016-01-18 ENCOUNTER — Telehealth: Payer: Self-pay | Admitting: Pharmacist

## 2016-01-18 DIAGNOSIS — E782 Mixed hyperlipidemia: Principal | ICD-10-CM

## 2016-01-18 DIAGNOSIS — Z8679 Personal history of other diseases of the circulatory system: Secondary | ICD-10-CM

## 2016-01-18 DIAGNOSIS — E1169 Type 2 diabetes mellitus with other specified complication: Secondary | ICD-10-CM

## 2016-01-18 MED ORDER — EVOLOCUMAB 140 MG/ML ~~LOC~~ SOAJ: 140.0000 mg | SUBCUTANEOUS | 2 refills | Status: DC

## 2016-01-18 NOTE — Telephone Encounter (Signed)
RX SENT TO [facility] AND PATIENT NOTIFIED.

## 2016-02-03 ENCOUNTER — Ambulatory Visit (INDEPENDENT_AMBULATORY_CARE_PROVIDER_SITE_OTHER): Payer: Medicare Other | Admitting: Family Medicine

## 2016-02-03 ENCOUNTER — Encounter: Payer: Self-pay | Admitting: Family Medicine

## 2016-02-03 ENCOUNTER — Ambulatory Visit (INDEPENDENT_AMBULATORY_CARE_PROVIDER_SITE_OTHER): Payer: Medicare Other

## 2016-02-03 VITALS — BP 150/76 | HR 82 | Temp 97.6°F | Ht 68.0 in | Wt 228.0 lb

## 2016-02-03 DIAGNOSIS — E782 Mixed hyperlipidemia: Secondary | ICD-10-CM

## 2016-02-03 DIAGNOSIS — K219 Gastro-esophageal reflux disease without esophagitis: Secondary | ICD-10-CM | POA: Diagnosis not present

## 2016-02-03 DIAGNOSIS — E669 Obesity, unspecified: Secondary | ICD-10-CM | POA: Diagnosis not present

## 2016-02-03 DIAGNOSIS — Z8679 Personal history of other diseases of the circulatory system: Secondary | ICD-10-CM | POA: Diagnosis not present

## 2016-02-03 DIAGNOSIS — I10 Essential (primary) hypertension: Secondary | ICD-10-CM

## 2016-02-03 DIAGNOSIS — E1169 Type 2 diabetes mellitus with other specified complication: Secondary | ICD-10-CM

## 2016-02-03 DIAGNOSIS — D649 Anemia, unspecified: Secondary | ICD-10-CM | POA: Diagnosis not present

## 2016-02-03 DIAGNOSIS — N400 Enlarged prostate without lower urinary tract symptoms: Secondary | ICD-10-CM

## 2016-02-03 DIAGNOSIS — R7989 Other specified abnormal findings of blood chemistry: Secondary | ICD-10-CM

## 2016-02-03 DIAGNOSIS — R710 Precipitous drop in hematocrit: Secondary | ICD-10-CM

## 2016-02-03 MED ORDER — EMPAGLIFLOZIN-METFORMIN HCL 12.5-1000 MG PO TABS: 1.0000 | ORAL_TABLET | Freq: Two times a day (BID) | ORAL | 3 refills | Status: DC

## 2016-02-03 MED ORDER — PANTOPRAZOLE SODIUM 40 MG PO TBEC: 40.0000 mg | DELAYED_RELEASE_TABLET | Freq: Two times a day (BID) | ORAL | 3 refills | Status: DC

## 2016-02-03 MED ORDER — OLMESARTAN MEDOXOMIL-HCTZ 40-25 MG PO TABS: 1.0000 | ORAL_TABLET | Freq: Every day | ORAL | 3 refills | Status: DC

## 2016-02-03 MED ORDER — ROSUVASTATIN CALCIUM 10 MG PO TABS: 10.0000 mg | ORAL_TABLET | ORAL | 3 refills | Status: DC

## 2016-02-03 MED ORDER — INSULIN ASPART 100 UNIT/ML FLEXPEN: 16.0000 [IU] | PEN_INJECTOR | Freq: Two times a day (BID) | SUBCUTANEOUS | 3 refills | Status: DC

## 2016-02-03 NOTE — Patient Instructions (Addendum)
Medicare Annual Wellness Visit  [facility] and the medical providers at [facility] strive to bring you the best medical care.  In doing so we not only want to address your current medical conditions and concerns but also to detect new conditions early and prevent illness, disease and health-related problems.    Medicare offers a yearly Wellness Visit which allows our clinical staff to assess your need for preventative services including immunizations, lifestyle education, counseling to decrease risk of preventable diseases and screening for fall risk and other medical concerns.    This visit is provided free of charge (no copay) for all Medicare recipients. The clinical pharmacists at [facility] have begun to conduct these Wellness Visits which will also include a thorough review of all your medications.    As you primary medical provider recommend that you make an appointment for your Annual Wellness Visit if you have not done so already this year.  You may set up this appointment before you leave today or you may call back [phone]) and schedule an appointment.  Please make sure when you call that you mention that you are scheduling your Annual Wellness Visit with the clinical pharmacist so that the appointment may be made for the proper length of time.    Continue current medications. Continue good therapeutic lifestyle changes which include good diet and exercise. Fall precautions discussed with patient. If an FOBT was given today- please return it to our front desk. If you are over 61 years old - you may need Prevnar 43 or the adult Pneumonia vaccine.  **Flu shots are available--- please call and schedule a FLU-CLINIC appointment**  After your visit with [place] today you will receive a survey in the mail or online from [employer] regarding your care with [place]. Please take a moment to fill this out. Your feedback is very  important to [place] as you can help [place] better understand your patient needs as well as improve your experience and satisfaction. WE CARE ABOUT YOU!!!   We will call with the results of the CBC as soon as those results become available The patient should continue with his iron preparation for the time being He should follow-up with the clinical pharmacists as planned The patient should try ranitidine 150 mg, the equate brand that can be purchased at [facility] and take one of these before supper every evening and continue the proton X before breakfast in the morning. If he has breakthrough heartburn at nighttime he can take the proton X before supper and the ranitidine before breakfast. If he has problems with this switch over he should get back in touch with [place]. He should continue to avoid caffeine and highly spiced foods as these can aggravate reflux. The patient should continue to follow-up with cardiology as planned

## 2016-02-03 NOTE — Progress Notes (Signed)
Subjective:    Patient ID: Marvin Frost, male    DOB: 1945-07-04, 70 y.o.   MRN: [id number]  HPI Pt here for follow up and management of chronic medical problems which includes diabetes, hyperlipidemia and hypertension. He is taking medications regularly.The patient continues to have arthralgias. He is status post a left total hip replacement. The patient has been taking over-the-counter iron preparation and we'll get a CBC today. He brings in blood sugars for review. The patient is followed regularly by the certified diabetic educator to help get blood sugars under control. He feels that he is in the best shape he is been for a good while even though the blood sugars that he brings in for review are still running in the 1:30 to 160 range fasting in the morning. These are better than in the past. These will be scanned into the record. He is too early for blood work and will get this and a couple weeks. We will call him with that result as soon as it comes back. The patient saw the cardiologist for his yearly visit in May of this year and got a good report. He denies any chest pain pressure or tightness. He does have occasional palpitations but this is been going on for a long time. He denies any shortness of breath other than what would be expected. He had a recent colonoscopy in 2015 and was told he did not need another colonoscopy. He denies any trouble with reflux heartburn indigestion nausea vomiting diarrhea or blood in the stool. He is taking an over-the-counter iron preparation will need the CBC again today as indicated. He's passing his water without problems. He is increasing his activity following his second hip replacement. He has put on some weight but feels like he is doing better with his activity levels and we'll be able to reduce this. The patient was informed about sedating antihistamines and understands not to take the Zyrtec regularly which she does not. He also as mentioned has very little  heartburn and will try switching over to ranitidine gradually from taking Protonix twice daily.      Patient Active Problem List   Diagnosis Date Noted  . 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 Primary osteoarthritis of both knees 01/26/2015  . Nonspecific (abnormal) findings on radiological and other examination of gastrointestinal tract 09/27/2010  . NAUSEA AND VOMITING 08/16/2010  . Type 2 diabetes mellitus not at goal [facility]) 03/18/2009  . HYPERLIPIDEMIA 03/18/2009  . OBESITY 03/18/2009  . Essential hypertension 03/18/2009  . Coronary atherosclerosis 03/18/2009  . [facility] 03/18/2009  . GERD 03/18/2009   Outpatient Encounter Prescriptions as of 02/03/2016  Medication Sig  . aspirin 81 MG tablet Take 81 mg by mouth daily.  . Blood Glucose Monitoring Suppl (FREESTYLE LITE) DEVI Use to check BS bid. DX. E11.9  . cetirizine (ZYRTEC) 10 MG tablet Take 10 mg by mouth as needed.   . Cholecalciferol (VITAMIN D3) 5000 UNITS TABS Take 1 tablet by mouth daily.   . Empagliflozin-Metformin HCl (SYNJARDY) 12.10-998 MG TABS Take 1 tablet by mouth 2 (two) times daily.  . Evolocumab (REPATHA SURECLICK) XX123456 MG/ML SOAJ Inject 140 mg into the skin every 14 (fourteen) days.  . ferrous sulfate 325 (65 FE) MG EC tablet Take 325 mg by mouth 2 (two) times daily.  Marland Kitchen glucose blood (FREESTYLE LITE) test strip USE TO CHECK BLOOD SUGAR TWICE A DAY AND AS NEEDED  . insulin aspart (NOVOLOG FLEXPEN) 100 UNIT/ML FlexPen Inject 16-20  Units into the skin 2 (two) times daily.  . Insulin Degludec (TRESIBA FLEXTOUCH) 200 UNIT/ML SOPN Inject 60-70 Units into the skin daily.  . Insulin Pen Needle (NOVOTWIST) 32G X 5 MM MISC Use with insulin pens up to 5 times per day.dx.E11.9  . Lancets (FREESTYLE) lancets Check BS BID and PRN. DX.E11.9  . Multiple Vitamin (MULTIVITAMIN) capsule Take 1 capsule by mouth daily after supper.   . olmesartan-hydrochlorothiazide (BENICAR HCT) 40-25 MG tablet Take 1 tablet by mouth daily.  . pantoprazole  (PROTONIX) 40 MG tablet Take 1 tablet (40 mg total) by mouth 2 (two) times daily.  . Probiotic Product (ALIGN) 4 MG CAPS Take 1 capsule by mouth every morning.  . rosuvastatin (CRESTOR) 10 MG tablet Take 1 tablet (10 mg total) by mouth every 3 (three) days.   No facility-administered encounter medications on file as of 02/03/2016.       Review of Systems  Constitutional: Negative.   HENT: Negative.   Eyes: Negative.   Respiratory: Negative.   Cardiovascular: Negative.   Gastrointestinal: Negative.   Endocrine: Negative.   Genitourinary: Negative.   Musculoskeletal: Positive for arthralgias (HIPs are doing better - more active).  Skin: Negative.   Allergic/Immunologic: Negative.   Neurological: Negative.   Hematological: Negative.   Psychiatric/Behavioral: Negative.        Objective:   Physical Exam  Constitutional: He is oriented to person, place, and time. He appears well-developed and well-nourished.  Pleasant and alert  HENT:  Head: Normocephalic and atraumatic.  Right Ear: External ear normal.  Left Ear: External ear normal.  Nose: Nose normal.  Mouth/Throat: Oropharynx is clear and moist. No oropharyngeal exudate.  Eyes: Conjunctivae and EOM are normal. Pupils are equal, round, and reactive to light. Right eye exhibits no discharge. Left eye exhibits no discharge. No scleral icterus.  Neck: Normal range of motion. Neck supple. No thyromegaly present.  Cardiovascular: Normal rate, regular rhythm, normal heart sounds and intact distal pulses.  Exam reveals no gallop and no friction rub.   No murmur heard. The heart has a regular rate and rhythm at 72/m  Pulmonary/Chest: Effort normal and breath sounds normal. No respiratory distress. He has no wheezes. He has no rales. He exhibits no tenderness.  No axillary adenopathy with good breath sounds anteriorly and posteriorly  Abdominal: Soft. Bowel sounds are normal. He exhibits no mass. There is no tenderness. There is no  rebound and no guarding.  Abdominal obesity without masses tenderness or organ enlargement or bruits. There is specifically no epigastric tenderness.  Musculoskeletal: Normal range of motion. He exhibits no edema or tenderness.  With bilateral hip replacement the patient has good mobility and minimal arthralgia in the left knee for which she has had for years from past injury. He will discuss this with his orthopedic surgeon in the future.  Lymphadenopathy:    He has no cervical adenopathy.  Neurological: He is alert and oriented to person, place, and time. He has normal reflexes. No cranial nerve deficit.  Skin: Skin is warm and dry. No rash noted.  Psychiatric: He has a normal mood and affect. His behavior is normal. Judgment and thought content normal.  Nursing note and vitals reviewed.   BP (!) 150/76 (BP Location: Left Arm)   Pulse 82   Temp 97.6 F (36.4 C) (Oral)   Ht 5\' 8"  (1.727 m)   Wt 228 lb (103.4 kg)   BMI 34.67 kg/m        Assessment & Plan:  1. Combined hyperlipidemia associated with type 2 diabetes mellitus ([place]) -Continue with aggressive therapeutic lifestyle changes -Continue with current diabetes treatment and follow-up with clinical pharmacy as planned and get lab work is planned in a couple weeks -Continue current cholesterol treatment   2. History of ASCVD -Follow-up with cardiology as planned  3. Low serum vitamin D -Continue vitamin D replacement pending results of lab work  4. BPH (benign prostatic hyperplasia) -The patient describes no symptoms with his prostate today.  5. Gastroesophageal reflux disease, esophagitis presence not specified -He continues to say he has no problems with reflux and we'll make efforts to gradually changed over to an H2 blocker from proton pump inhibitor treatment. This was explained to him during the visit.  6. Hypertension -The blood pressure was slightly elevated for the systolic. He says that his home readings are  consistently in the 120s over the 70s. We will not make any changes in his treatment today. He will continue to work on losing weight and increasing his activity level.  7. Gastroesophageal reflux disease without esophagitis -He will reduce his proton X to once daily before eating and we'll substitute ranitidine 150 mg once daily before his evening meal  8. Obesity -He will continue to work on his BMI and reduce this is much as possible with diet and exercise  Meds ordered this encounter  Medications  . rosuvastatin (CRESTOR) 10 MG tablet    Sig: Take 1 tablet (10 mg total) by mouth every 3 (three) days.    Dispense:  90 tablet    Refill:  3  . pantoprazole (PROTONIX) 40 MG tablet    Sig: Take 1 tablet (40 mg total) by mouth 2 (two) times daily.    Dispense:  180 tablet    Refill:  3  . olmesartan-hydrochlorothiazide (BENICAR HCT) 40-25 MG tablet    Sig: Take 1 tablet by mouth daily.    Dispense:  90 tablet    Refill:  3  . insulin aspart (NOVOLOG FLEXPEN) 100 UNIT/ML FlexPen    Sig: Inject 16-20 Units into the skin 2 (two) times daily.    Dispense:  45 mL    Refill:  3  . Empagliflozin-Metformin HCl (SYNJARDY) 12.10-998 MG TABS    Sig: Take 1 tablet by mouth 2 (two) times daily.    Dispense:  180 tablet    Refill:  3    To replace [employer]oka[employer] due to insurance formulary   Patient Instructions                       Medicare Annual Wellness Visit  [facility] and the medical providers at [facility] strive to bring you the best medical care.  In doing so we not only want to address your current medical conditions and concerns but also to detect new conditions early and prevent illness, disease and health-related problems.    Medicare offers a yearly Wellness Visit which allows our clinical staff to assess your need for preventative services including immunizations, lifestyle education, counseling to decrease risk of preventable diseases and screening for fall  risk and other medical concerns.    This visit is provided free of charge (no copay) for all Medicare recipients. The clinical pharmacists at [facility] have begun to conduct these Wellness Visits which will also include a thorough review of all your medications.    As you primary medical provider recommend that you make an appointment for your Annual  Wellness Visit if you have not done so already this year.  You may set up this appointment before you leave today or you may call back [phone] and schedule an appointment.  Please make sure when you call that you mention that you are scheduling your Annual Wellness Visit with the clinical pharmacist so that the appointment may be made for the proper length of time.    Continue current medications. Continue good therapeutic lifestyle changes which include good diet and exercise. Fall precautions discussed with patient. If an FOBT was given today- please return it to our front desk. If you are over 78 years old - you may need Prevnar 67 or the adult Pneumonia vaccine.  **Flu shots are available--- please call and schedule a FLU-CLINIC appointment**  After your visit with [place] today you will receive a survey in the mail or online from [employer] regarding your care with [place]. Please take a moment to fill this out. Your feedback is very important to [place] as you can help [place] better understand your patient needs as well as improve your experience and satisfaction. WE CARE ABOUT YOU!!!   We will call with the results of the CBC as soon as those results become available The patient should continue with his iron preparation for the time being He should follow-up with the clinical pharmacists as planned The patient should try ranitidine 150 mg, the equate brand that can be purchased at [facility] and take one of these before supper every evening and continue the proton X before breakfast in the morning. If he has breakthrough heartburn at  nighttime he can take the proton X before supper and the ranitidine before breakfast. If he has problems with this switch over he should get back in touch with [place]. He should continue to avoid caffeine and highly spiced foods as these can aggravate reflux. The patient should continue to follow-up with cardiology as planned    Arrie Senate MD

## 2016-02-03 NOTE — Addendum Note (Signed)
Addended by: Zannie Cove on: 02/03/2016 08:38 AM   Modules accepted: Orders

## 2016-02-04 ENCOUNTER — Telehealth: Payer: Self-pay | Admitting: *Deleted

## 2016-02-04 LAB — CBC WITH DIFFERENTIAL/PLATELET
BASOS: 1 %
Basophils Absolute: 0.1 10*3/uL (ref 0.0–0.2)
EOS (ABSOLUTE): 0.5 10*3/uL — AB (ref 0.0–0.4)
EOS: 6 %
HEMATOCRIT: 40.1 % (ref 37.5–51.0)
HEMOGLOBIN: 12 g/dL — AB (ref 12.6–17.7)
IMMATURE GRANS (ABS): 0 10*3/uL (ref 0.0–0.1)
IMMATURE GRANULOCYTES: 0 %
LYMPHS: 13 %
Lymphocytes Absolute: 1 10*3/uL (ref 0.7–3.1)
MCH: 21.6 pg — ABNORMAL LOW (ref 26.6–33.0)
MCHC: 29.9 g/dL — AB (ref 31.5–35.7)
MCV: 72 fL — ABNORMAL LOW (ref 79–97)
MONOS ABS: 0.7 10*3/uL (ref 0.1–0.9)
Monocytes: 9 %
NEUTROS PCT: 71 %
Neutrophils Absolute: 6 10*3/uL (ref 1.4–7.0)
Platelets: 377 10*3/uL (ref 150–379)
RBC: 5.56 x10E6/uL (ref 4.14–5.80)
RDW: 22.5 % — AB (ref 12.3–15.4)
WBC: 8.3 10*3/uL (ref 3.4–10.8)

## 2016-02-04 NOTE — Telephone Encounter (Signed)
-----   Message from Chipper Herb, MD sent at 02/04/2016  7:14 AM EDT ----- The CBC has a normal white blood cell count. The hemoglobin is improved but still slightly decreased from the normal range at 12.0. The platelet count is adequate. The patient should continue to try to take the over-the-counter iron preparation at least one daily and be sure and take this with food. He should have the CBC repeated again in 4-6 weeks+++++

## 2016-02-04 NOTE — Telephone Encounter (Signed)
Pt notified of results Verbalizes understanding 

## 2016-02-08 ENCOUNTER — Telehealth: Payer: Self-pay

## 2016-02-08 NOTE — Telephone Encounter (Signed)
x

## 2016-02-16 ENCOUNTER — Telehealth: Payer: Self-pay | Admitting: Family Medicine

## 2016-02-16 NOTE — Telephone Encounter (Signed)
Marvin Frost was working on this but she is out of office this week.  I will see what I can find out and let patient know.

## 2016-02-17 ENCOUNTER — Other Ambulatory Visit: Payer: Medicare Other

## 2016-02-17 DIAGNOSIS — N400 Enlarged prostate without lower urinary tract symptoms: Secondary | ICD-10-CM | POA: Diagnosis not present

## 2016-02-17 DIAGNOSIS — R7989 Other specified abnormal findings of blood chemistry: Secondary | ICD-10-CM | POA: Diagnosis not present

## 2016-02-17 DIAGNOSIS — E1169 Type 2 diabetes mellitus with other specified complication: Secondary | ICD-10-CM

## 2016-02-17 DIAGNOSIS — D649 Anemia, unspecified: Secondary | ICD-10-CM | POA: Diagnosis not present

## 2016-02-17 DIAGNOSIS — E669 Obesity, unspecified: Secondary | ICD-10-CM

## 2016-02-17 DIAGNOSIS — Z8679 Personal history of other diseases of the circulatory system: Secondary | ICD-10-CM | POA: Diagnosis not present

## 2016-02-17 DIAGNOSIS — E782 Mixed hyperlipidemia: Principal | ICD-10-CM

## 2016-02-17 DIAGNOSIS — K219 Gastro-esophageal reflux disease without esophagitis: Secondary | ICD-10-CM

## 2016-02-17 DIAGNOSIS — R710 Precipitous drop in hematocrit: Secondary | ICD-10-CM

## 2016-02-17 DIAGNOSIS — I10 Essential (primary) hypertension: Secondary | ICD-10-CM | POA: Diagnosis not present

## 2016-02-17 LAB — BAYER DCA HB A1C WAIVED: HB A1C: 7.4 % — AB (ref <7.0)

## 2016-02-17 NOTE — Telephone Encounter (Signed)
Both Marvin Frost and Repatha were denied per Marvin Haring, RN who called yesterday.  Her notes mention that meds were denied because PCP is not prescribing in consultation with cardiologist or endocrinologist.   Will look into this more and see if we need to just get letter of confirmation from Marvin Frost.  Patient does not see endocrinologist but he has done extrememly well with [place].  Left message for patient bout above and mentioned that if he need samples to let me know and we will do what we can until PA issue can be resolved.

## 2016-02-18 LAB — NMR, LIPOPROFILE
CHOLESTEROL: 97 mg/dL — AB (ref 100–199)
HDL Cholesterol by NMR: 44 mg/dL (ref >39)
HDL PARTICLE NUMBER: 37.1 umol/L (ref >=30.5)
LP-IR Score: 66 — ABNORMAL HIGH (ref <=45)
Small LDL Particle Number: <90 nmol/L (ref <=527)
TRIGLYCERIDES BY NMR: 271 mg/dL — AB (ref 0–149)

## 2016-02-18 LAB — CBC WITH DIFFERENTIAL/PLATELET
BASOS: 1 %
Basophils Absolute: 0.1 10*3/uL (ref 0.0–0.2)
EOS (ABSOLUTE): 0.5 10*3/uL — AB (ref 0.0–0.4)
Eos: 5 %
HEMATOCRIT: 40.8 % (ref 37.5–51.0)
HEMOGLOBIN: 12.3 g/dL — AB (ref 12.6–17.7)
IMMATURE GRANS (ABS): 0 10*3/uL (ref 0.0–0.1)
Immature Granulocytes: 0 %
LYMPHS ABS: 1.1 10*3/uL (ref 0.7–3.1)
LYMPHS: 12 %
MCH: 21.8 pg — AB (ref 26.6–33.0)
MCHC: 30.1 g/dL — ABNORMAL LOW (ref 31.5–35.7)
MCV: 72 fL — ABNORMAL LOW (ref 79–97)
Monocytes Absolute: 0.6 10*3/uL (ref 0.1–0.9)
Monocytes: 6 %
NEUTROS ABS: 6.9 10*3/uL (ref 1.4–7.0)
Neutrophils: 76 %
Platelets: 374 10*3/uL (ref 150–379)
RBC: 5.65 x10E6/uL (ref 4.14–5.80)
RDW: 20.6 % — ABNORMAL HIGH (ref 12.3–15.4)
WBC: 9.1 10*3/uL (ref 3.4–10.8)

## 2016-02-18 LAB — BMP8+EGFR
BUN / CREAT RATIO: 13 (ref 10–24)
BUN: 13 mg/dL (ref 8–27)
CALCIUM: 9 mg/dL (ref 8.6–10.2)
CHLORIDE: 99 mmol/L (ref 96–106)
CO2: 20 mmol/L (ref 18–29)
Creatinine, Ser: 0.97 mg/dL (ref 0.76–1.27)
GFR, EST AFRICAN AMERICAN: 90 mL/min/{1.73_m2} (ref >59)
GFR, EST NON AFRICAN AMERICAN: 78 mL/min/{1.73_m2} (ref >59)
Glucose: 176 mg/dL — ABNORMAL HIGH (ref 65–99)
POTASSIUM: 4.2 mmol/L (ref 3.5–5.2)
SODIUM: 140 mmol/L (ref 134–144)

## 2016-02-18 LAB — HEPATIC FUNCTION PANEL
ALBUMIN: 4.2 g/dL (ref 3.5–4.8)
ALK PHOS: 66 IU/L (ref 39–117)
ALT: 14 IU/L (ref 0–44)
AST: 17 IU/L (ref 0–40)
BILIRUBIN, DIRECT: 0.07 mg/dL (ref 0.00–0.40)
Bilirubin Total: 0.2 mg/dL (ref 0.0–1.2)
TOTAL PROTEIN: 6.7 g/dL (ref 6.0–8.5)

## 2016-03-01 ENCOUNTER — Telehealth: Payer: Self-pay | Admitting: Pharmacist

## 2016-03-02 MED ORDER — EVOLOCUMAB WITH INFUSOR 420 MG/3.5ML ~~LOC~~ SOCT: 420.0000 mg | SUBCUTANEOUS | 0 refills | Status: AC

## 2016-03-02 NOTE — Telephone Encounter (Signed)
Working on appeal. Need patient to come by to sign consent for representation on appeal.  Also left sample for 1 month.  Patient aware

## 2016-03-13 ENCOUNTER — Other Ambulatory Visit: Payer: Self-pay | Admitting: Family Medicine

## 2016-03-22 ENCOUNTER — Telehealth: Payer: Self-pay | Admitting: Pharmacist Clinician (PhC)/ Clinical Pharmacy Specialist

## 2016-03-22 NOTE — Telephone Encounter (Signed)
Letter written for PCP to send to insurance regarding need for Repatha for patient.

## 2016-03-22 NOTE — Telephone Encounter (Signed)
This is a letter by the patient's cardiologist recommending that Repatha is appropriate for this patient

## 2016-04-03 ENCOUNTER — Encounter: Payer: Self-pay | Admitting: *Deleted

## 2016-04-05 ENCOUNTER — Encounter: Payer: Self-pay | Admitting: *Deleted

## 2016-04-12 ENCOUNTER — Ambulatory Visit (INDEPENDENT_AMBULATORY_CARE_PROVIDER_SITE_OTHER): Payer: Medicare Other

## 2016-04-12 DIAGNOSIS — Z23 Encounter for immunization: Secondary | ICD-10-CM | POA: Diagnosis not present

## 2016-04-17 ENCOUNTER — Ambulatory Visit: Payer: Self-pay | Admitting: Pharmacist

## 2016-04-19 ENCOUNTER — Encounter: Payer: Self-pay | Admitting: Pharmacist

## 2016-04-19 ENCOUNTER — Ambulatory Visit (INDEPENDENT_AMBULATORY_CARE_PROVIDER_SITE_OTHER): Payer: Medicare Other | Admitting: Pharmacist

## 2016-04-19 VITALS — BP 144/72 | Ht 68.0 in | Wt 228.0 lb

## 2016-04-19 DIAGNOSIS — E785 Hyperlipidemia, unspecified: Secondary | ICD-10-CM | POA: Diagnosis not present

## 2016-04-19 DIAGNOSIS — E119 Type 2 diabetes mellitus without complications: Secondary | ICD-10-CM | POA: Diagnosis not present

## 2016-04-19 NOTE — Progress Notes (Signed)
Patient ID: Verle Gelsinger, male   DOB: 01/25/1946, 70 y.o.   MRN: [id number]    Subjective:   Marvin Frost is a 70 y.o. male who presents for follow up of diabetes, obesity and anemia.  Marvin Frost was seen last about 6 months ago. He is taking Tresiba 68 units at bedtime and Novolog 25 units twice a day (breakfast and supper) SynJardy 12.5 / 1000mg  BID  AM fasting 127 - 233 (this is down from 170's to 250's) Prior to lunch 195 Prior to supper 130-146 Bedtime 132 Current exercise - None currently due to knee pain / discomfort - seeing ortho in 1 month to discuss possible surgery.  Patient is also taking Repatha q14 days for lipids and roseuvastatin 10mg  every 3 days.   LIpids are note at goal however last lipid panel showed LDL that was "undetectable"   Review of Systems  Review of Systems  Constitutional: Negative.   HENT: Negative.   Eyes: Negative.   Respiratory: Negative.   Cardiovascular: Negative.   Gastrointestinal: Negative.   Genitourinary: Negative.   Musculoskeletal: Positive for joint pain (left knee - see ortho in 1 month).  Skin: Negative.   Neurological: Negative.   Endo/Heme/Allergies: Negative.   Psychiatric/Behavioral: Negative.       Current Medications (verified) Outpatient Encounter Prescriptions as of 04/19/2016  Medication Sig  . aspirin 81 MG tablet Take 81 mg by mouth daily.  . Blood Glucose Monitoring Suppl (FREESTYLE LITE) DEVI Use to check BS bid. DX. E11.9  . cetirizine (ZYRTEC) 10 MG tablet Take 10 mg by mouth as needed.   . Cholecalciferol (VITAMIN D3) 5000 UNITS TABS Take 1 tablet by mouth daily.   . Empagliflozin-Metformin HCl (SYNJARDY) 12.10-998 MG TABS Take 1 tablet by mouth 2 (two) times daily.  . Evolocumab (REPATHA SURECLICK) [id number] MG/ML SOAJ Inject 140 mg into the skin every 14 (fourteen) days.  Marland Kitchen glucose blood (FREESTYLE LITE) test strip USE TO CHECK BLOOD SUGAR TWICE A DAY AND AS NEEDED  . insulin aspart (NOVOLOG FLEXPEN) 100 UNIT/ML  FlexPen Inject 16-20 Units into the skin 2 (two) times daily.  . Insulin Degludec (TRESIBA FLEXTOUCH) 200 UNIT/ML SOPN Inject 60-70 Units into the skin daily.  . Lancets (FREESTYLE) lancets Check BS BID and PRN. DX.E11.9  . Multiple Vitamin (MULTIVITAMIN) capsule Take 1 capsule by mouth daily after supper.   Marland Kitchen NOVOTWIST 32G X 5 MM MISC USE WITH INSULIN PENS UP TO 5 TIMES PER DAY  . olmesartan-hydrochlorothiazide (BENICAR HCT) 40-25 MG tablet Take 1 tablet by mouth daily.  . pantoprazole (PROTONIX) 40 MG tablet Take 1 tablet (40 mg total) by mouth 2 (two) times daily.  . Probiotic Product (ALIGN) 4 MG CAPS Take 1 capsule by mouth every morning.  . rosuvastatin (CRESTOR) 10 MG tablet Take 1 tablet (10 mg total) by mouth every 3 (three) days.  . [DISCONTINUED] ferrous sulfate 325 (65 FE) MG EC tablet Take 325 mg by mouth 2 (two) times daily.   No facility-administered encounter medications on file as of 04/19/2016.     Allergies (verified) Baxinets [cetylpyridinium-benzocaine]; Celebrex [celecoxib]; Clarithromycin; Liraglutide; Statins; Toprol xl [metoprolol succinate]; Triplex ad; Welchol [colesevelam hcl]; and Norvasc [amlodipine besylate]   History: Past Medical History:  Diagnosis Date  . Arthritis   . CAD (coronary artery disease)    Nonobstructive Cath 2004  . 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 Cataract   . Diabetes mellitus   .  Dysrhythmia    history of heart palpitations   . GERD (gastroesophageal reflux disease)   . Heart palpitations    last episode- 09/2014 seen by Dr Percival Spanish 5/16- clearance for surgery   . History of frequent urinary tract infections   . History of hiatal hernia   . Hyperlipemia   . Hypertension   . Obesity   . Pancreatitis    Past Surgical History:  Procedure Laterality Date  . CARDIAC CATHETERIZATION     2004 and 1999- done by Dr Percival Spanish   . COLONOSCOPY  11/2003   diverticulosis, no polyps  . HYDROCELE EXCISION /  REPAIR    . left calf surgery      related to trauma of being torn   . left index finger arthrioscopic surgery     . lymph node removed from right neck     . right shoulder surgery    . TOTAL HIP ARTHROPLASTY Right 03/16/2015   Procedure: RIGHT TOTAL HIP ARTHROPLASTY ANTERIOR APPROACH;  Surgeon: Paralee Cancel, MD;  Location: [facility];  Service: Orthopedics;  Laterality: Right;  . TOTAL HIP ARTHROPLASTY Left 06/22/2015   Procedure: LEFT TOTAL HIP ARTHROPLASTY ANTERIOR APPROACH;  Surgeon: Paralee Cancel, MD;  Location: [facility];  Service: Orthopedics;  Laterality: Left;  . UPPER GASTROINTESTINAL ENDOSCOPY  08/19/2010   hiatal hernia 2cm, mild gastritis   Family History  Problem Relation Age of Onset  . Heart disease Mother   . Diabetes Brother   . Heart disease Brother   . Heart attack Brother 26  . Heart attack Father 61  . GI problems Sister     torn esophagus during endoscopy      Objective:    Today's Vitals   04/19/16 0920 04/19/16 0931  BP: (!) 148/82 (!) 144/72  Weight: 228 lb (103.4 kg)   Height: 5\' 8"  (1.727 m)   PainSc: 2     Body mass index is 34.67 kg/m.  A1c = 8.0% (10/2015) A1c = 7.4% (02/17/2016)    Depression Screen PHQ 2/9 Scores 02/03/2016 12/14/2015 11/12/2015 08/26/2015  PHQ - 2 Score 0 0 0 0  PHQ- 9 Score - - - -         Assessment:    Controlled type 2 DM Hyperlipidemia     Plan:   1. Continue Tresiba u-200 - Inject 68 units daily and Novolog 25 units bid 2. Continue Synjardy 2.5/1000mg  1 tablet bid 3. continue Repatha - PA with [employer] is pending 4.Nutrition counseling - Discussed limiting high CHO and caloric foods and decreasing portion sizes. 5.  Would like to see patient increase physical activity but will hold off any sternous exercise until after appt with ortho.  6.  RTC 3 months to see PCP - 6 months to see me.  Orders Placed This Encounter  Procedures  . Bayer DCA Hb A1c Waived    Standing Status:   Future    Standing  Expiration Date:   06/19/2016  . Microalbumin / creatinine urine ratio  . LDL Cholesterol, Direct    Standing Status:   Future    Standing Expiration Date:   06/19/2016     Patient Instructions (the written plan) were given to the patient.   Cherre Robins, PharmD   04/19/2016        Patient ID: Marvin Frost, male   DOB: 1945/07/26, 70 y.o.   MRN: [id number]

## 2016-04-20 LAB — MICROALBUMIN / CREATININE URINE RATIO
Creatinine, Urine: 53.3 mg/dL
Microalbumin, Urine: <3 ug/mL

## 2016-04-27 ENCOUNTER — Ambulatory Visit (INDEPENDENT_AMBULATORY_CARE_PROVIDER_SITE_OTHER): Payer: Medicare Other | Admitting: Ophthalmology

## 2016-04-27 DIAGNOSIS — H43813 Vitreous degeneration, bilateral: Secondary | ICD-10-CM | POA: Diagnosis not present

## 2016-04-27 DIAGNOSIS — I10 Essential (primary) hypertension: Secondary | ICD-10-CM

## 2016-04-27 DIAGNOSIS — H2513 Age-related nuclear cataract, bilateral: Secondary | ICD-10-CM

## 2016-04-27 DIAGNOSIS — H353111 Nonexudative age-related macular degeneration, right eye, early dry stage: Secondary | ICD-10-CM | POA: Diagnosis not present

## 2016-04-27 DIAGNOSIS — H35033 Hypertensive retinopathy, bilateral: Secondary | ICD-10-CM | POA: Diagnosis not present

## 2016-04-27 DIAGNOSIS — E113293 Type 2 diabetes mellitus with mild nonproliferative diabetic retinopathy without macular edema, bilateral: Secondary | ICD-10-CM | POA: Diagnosis not present

## 2016-04-27 DIAGNOSIS — E11319 Type 2 diabetes mellitus with unspecified diabetic retinopathy without macular edema: Secondary | ICD-10-CM | POA: Diagnosis not present

## 2016-04-27 LAB — HM DIABETES EYE EXAM

## 2016-05-05 DIAGNOSIS — Z96641 Presence of right artificial hip joint: Secondary | ICD-10-CM | POA: Diagnosis not present

## 2016-05-05 DIAGNOSIS — Z471 Aftercare following joint replacement surgery: Secondary | ICD-10-CM | POA: Diagnosis not present

## 2016-05-05 DIAGNOSIS — Z96643 Presence of artificial hip joint, bilateral: Secondary | ICD-10-CM | POA: Diagnosis not present

## 2016-05-05 DIAGNOSIS — M25562 Pain in left knee: Secondary | ICD-10-CM | POA: Diagnosis not present

## 2016-05-05 DIAGNOSIS — Z96642 Presence of left artificial hip joint: Secondary | ICD-10-CM | POA: Diagnosis not present

## 2016-05-19 ENCOUNTER — Other Ambulatory Visit: Payer: Self-pay | Admitting: Family Medicine

## 2016-05-19 DIAGNOSIS — Z8679 Personal history of other diseases of the circulatory system: Secondary | ICD-10-CM

## 2016-05-19 DIAGNOSIS — E782 Mixed hyperlipidemia: Principal | ICD-10-CM

## 2016-05-19 DIAGNOSIS — E1169 Type 2 diabetes mellitus with other specified complication: Secondary | ICD-10-CM

## 2016-05-23 ENCOUNTER — Other Ambulatory Visit: Payer: Medicare Other

## 2016-05-23 DIAGNOSIS — E785 Hyperlipidemia, unspecified: Secondary | ICD-10-CM | POA: Diagnosis not present

## 2016-05-23 DIAGNOSIS — E119 Type 2 diabetes mellitus without complications: Secondary | ICD-10-CM

## 2016-05-23 LAB — BAYER DCA HB A1C WAIVED: HB A1C: 7.7 % — AB (ref <7.0)

## 2016-05-24 ENCOUNTER — Other Ambulatory Visit: Payer: Self-pay | Admitting: *Deleted

## 2016-05-24 DIAGNOSIS — E782 Mixed hyperlipidemia: Principal | ICD-10-CM

## 2016-05-24 DIAGNOSIS — E1169 Type 2 diabetes mellitus with other specified complication: Secondary | ICD-10-CM

## 2016-05-24 DIAGNOSIS — Z8679 Personal history of other diseases of the circulatory system: Secondary | ICD-10-CM

## 2016-05-24 LAB — LDL CHOLESTEROL, DIRECT: LDL DIRECT: 70 mg/dL (ref 0–99)

## 2016-05-24 MED ORDER — EVOLOCUMAB 140 MG/ML ~~LOC~~ SOAJ: 140.0000 mg | SUBCUTANEOUS | 4 refills | Status: DC

## 2016-06-02 DIAGNOSIS — E113293 Type 2 diabetes mellitus with mild nonproliferative diabetic retinopathy without macular edema, bilateral: Secondary | ICD-10-CM | POA: Diagnosis not present

## 2016-06-02 DIAGNOSIS — H2512 Age-related nuclear cataract, left eye: Secondary | ICD-10-CM | POA: Diagnosis not present

## 2016-06-02 DIAGNOSIS — H25811 Combined forms of age-related cataract, right eye: Secondary | ICD-10-CM | POA: Diagnosis not present

## 2016-07-03 DIAGNOSIS — H25811 Combined forms of age-related cataract, right eye: Secondary | ICD-10-CM | POA: Diagnosis not present

## 2016-07-03 DIAGNOSIS — H2511 Age-related nuclear cataract, right eye: Secondary | ICD-10-CM | POA: Diagnosis not present

## 2016-07-13 ENCOUNTER — Other Ambulatory Visit: Payer: Self-pay | Admitting: Family Medicine

## 2016-07-14 ENCOUNTER — Encounter: Payer: Self-pay | Admitting: Family Medicine

## 2016-07-14 ENCOUNTER — Ambulatory Visit (INDEPENDENT_AMBULATORY_CARE_PROVIDER_SITE_OTHER): Payer: Medicare Other | Admitting: Family Medicine

## 2016-07-14 VITALS — BP 138/85 | HR 105 | Temp 97.2°F | Ht 68.0 in | Wt 232.5 lb

## 2016-07-14 DIAGNOSIS — J209 Acute bronchitis, unspecified: Secondary | ICD-10-CM | POA: Diagnosis not present

## 2016-07-14 MED ORDER — AZITHROMYCIN 250 MG PO TABS: ORAL_TABLET | ORAL | 0 refills | Status: DC

## 2016-07-14 NOTE — Progress Notes (Signed)
   HPI  Patient presents today here with cough and cold.  Patient explains he said 3 days chest congestion, nasal congestion, and productive cough of thick yellow sputum. His wife was in the hospital last week for A. fib with rapid ventricular rate as well as pneumonia. He is concerned that he's caught similar illness.  He denies any fever, chills, sweats, or chest pain.  He is tolerating food and fluids normally.   PMH: Smoking status noted ROS: Per HPI  Objective: BP 138/85   Pulse (!) 105   Temp 97.2 F (36.2 C) (Oral)   Ht 5\' 8"  (1.727 m)   Wt 232 lb 8 oz (105.5 kg)   BMI 35.35 kg/m  Gen: NAD, alert, cooperative with exam HEENT: NCAT, nares clear, oropharynx moist and clear, TMs normal bilaterally CV: RRR, good S1/S2, no murmur Resp: Nonlabored, on good air movement, soft crackles in the right base Ext: No edema, warm Neuro: Alert and oriented, No gross deficits  Assessment and plan:  # Acute bronchitis Possibly developing pneumonia. Considering sick contacts, comorbidities, soft crackles in the right base, and worsening symptoms I have covered him with azithromycin. Discussed over-the-counter cough medications and supportive care Low threshold for follow-up if needed. Listed Biaxin allergy, however has tolerated azithromycin previously.   Meds ordered this encounter  Medications  . azithromycin (ZITHROMAX) 250 MG tablet    Sig: Take 2 tablets on day 1 and 1 tablet daily after that    Dispense:  6 tablet    Refill:  [place], MD [facility] 07/14/2016, 10:42 AM

## 2016-07-14 NOTE — Patient Instructions (Signed)
Great to see you!  Try plain mucinex to loosen the phlegm in your cough, start the antibiotics and be sure to finish all 5 days of pills.   Come back with any concerns   Acute Bronchitis, Adult Acute bronchitis is when air tubes (bronchi) in the lungs suddenly get swollen. The condition can make it hard to breathe. It can also cause these symptoms:  A cough.  Coughing up clear, yellow, or green mucus.  Wheezing.  Chest congestion.  Shortness of breath.  A fever.  Body aches.  Chills.  A sore throat. Follow these instructions at home: Medicines  Take over-the-counter and prescription medicines only as told by your doctor.  If you were prescribed an antibiotic medicine, take it as told by your doctor. Do not stop taking the antibiotic even if you start to feel better. General instructions  Rest.  Drink enough fluids to keep your pee (urine) clear or pale yellow.  Avoid smoking and secondhand smoke. If you smoke and you need help quitting, ask your doctor. Quitting will help your lungs heal faster.  Use an inhaler, cool mist vaporizer, or humidifier as told by your doctor.  Keep all follow-up visits as told by your doctor. This is important. How is this prevented? To lower your risk of getting this condition again:  Wash your hands often with soap and water. If you cannot use soap and water, use hand sanitizer.  Avoid contact with people who have cold symptoms.  Try not to touch your hands to your mouth, nose, or eyes.  Make sure to get the flu shot every year. Contact a doctor if:  Your symptoms do not get better in 2 weeks. Get help right away if:  You cough up blood.  You have chest pain.  You have very bad shortness of breath.  You become dehydrated.  You faint (pass out) or keep feeling like you are going to pass out.  You keep throwing up (vomiting).  You have a very bad headache.  Your fever or chills gets worse. This information is not  intended to replace advice given to you by your health care provider. Make sure you discuss any questions you have with your health care provider. Document Released: 11/29/2007 Document Revised: 01/19/2016 Document Reviewed: 12/01/2015 Elsevier Interactive Patient Education  2017 [employer].

## 2016-07-20 DIAGNOSIS — H2512 Age-related nuclear cataract, left eye: Secondary | ICD-10-CM | POA: Diagnosis not present

## 2016-07-24 DIAGNOSIS — H2512 Age-related nuclear cataract, left eye: Secondary | ICD-10-CM | POA: Diagnosis not present

## 2016-08-04 ENCOUNTER — Encounter: Payer: Self-pay | Admitting: Family Medicine

## 2016-08-04 ENCOUNTER — Ambulatory Visit (INDEPENDENT_AMBULATORY_CARE_PROVIDER_SITE_OTHER): Payer: Medicare Other | Admitting: Family Medicine

## 2016-08-04 VITALS — BP 136/75 | HR 91 | Temp 97.1°F | Ht 68.0 in | Wt 227.0 lb

## 2016-08-04 DIAGNOSIS — Z8679 Personal history of other diseases of the circulatory system: Secondary | ICD-10-CM | POA: Diagnosis not present

## 2016-08-04 DIAGNOSIS — E1169 Type 2 diabetes mellitus with other specified complication: Secondary | ICD-10-CM

## 2016-08-04 DIAGNOSIS — R7989 Other specified abnormal findings of blood chemistry: Secondary | ICD-10-CM

## 2016-08-04 DIAGNOSIS — E782 Mixed hyperlipidemia: Secondary | ICD-10-CM | POA: Diagnosis not present

## 2016-08-04 DIAGNOSIS — K219 Gastro-esophageal reflux disease without esophagitis: Secondary | ICD-10-CM | POA: Diagnosis not present

## 2016-08-04 DIAGNOSIS — E119 Type 2 diabetes mellitus without complications: Secondary | ICD-10-CM

## 2016-08-04 DIAGNOSIS — N400 Enlarged prostate without lower urinary tract symptoms: Secondary | ICD-10-CM | POA: Diagnosis not present

## 2016-08-04 LAB — URINALYSIS
Bilirubin, UA: NEGATIVE
Ketones, UA: NEGATIVE
LEUKOCYTES UA: NEGATIVE
NITRITE UA: NEGATIVE
PROTEIN UA: NEGATIVE
RBC UA: NEGATIVE
SPEC GRAV UA: 1.015 (ref 1.005–1.030)
Urobilinogen, Ur: 0.2 mg/dL (ref 0.2–1.0)
pH, UA: 5 (ref 5.0–7.5)

## 2016-08-04 MED ORDER — FREESTYLE LANCETS MISC: 3 refills | Status: DC

## 2016-08-04 MED ORDER — GLUCOSE BLOOD VI STRP: ORAL_STRIP | 3 refills | Status: DC

## 2016-08-04 MED ORDER — INSULIN ASPART 100 UNIT/ML FLEXPEN: 16.0000 [IU] | PEN_INJECTOR | Freq: Two times a day (BID) | SUBCUTANEOUS | 3 refills | Status: DC

## 2016-08-04 MED ORDER — OLMESARTAN MEDOXOMIL-HCTZ 40-25 MG PO TABS: 1.0000 | ORAL_TABLET | Freq: Every day | ORAL | 3 refills | Status: DC

## 2016-08-04 MED ORDER — INSULIN PEN NEEDLE 32G X 5 MM MISC: 1 refills | Status: DC

## 2016-08-04 MED ORDER — INSULIN DEGLUDEC 200 UNIT/ML ~~LOC~~ SOPN: 60.0000 [IU] | PEN_INJECTOR | Freq: Every day | SUBCUTANEOUS | 3 refills | Status: DC

## 2016-08-04 MED ORDER — PANTOPRAZOLE SODIUM 40 MG PO TBEC: 40.0000 mg | DELAYED_RELEASE_TABLET | Freq: Two times a day (BID) | ORAL | 3 refills | Status: DC

## 2016-08-04 MED ORDER — ROSUVASTATIN CALCIUM 10 MG PO TABS: 10.0000 mg | ORAL_TABLET | ORAL | 3 refills | Status: DC

## 2016-08-04 MED ORDER — EMPAGLIFLOZIN-METFORMIN HCL 12.5-1000 MG PO TABS: 1.0000 | ORAL_TABLET | Freq: Two times a day (BID) | ORAL | 3 refills | Status: DC

## 2016-08-04 NOTE — Patient Instructions (Addendum)
Medicare Annual Wellness Visit  [facility] and the medical providers at [facility] strive to bring you the best medical care.  In doing so we not only want to address your current medical conditions and concerns but also to detect new conditions early and prevent illness, disease and health-related problems.    Medicare offers a yearly Wellness Visit which allows our clinical staff to assess your need for preventative services including immunizations, lifestyle education, counseling to decrease risk of preventable diseases and screening for fall risk and other medical concerns.    This visit is provided free of charge (no copay) for all Medicare recipients. The clinical pharmacists at [facility] have begun to conduct these Wellness Visits which will also include a thorough review of all your medications.    As you primary medical provider recommend that you make an appointment for your Annual Wellness Visit if you have not done so already this year.  You may set up this appointment before you leave today or you may call back [phone] and schedule an appointment.  Please make sure when you call that you mention that you are scheduling your Annual Wellness Visit with the clinical pharmacist so that the appointment may be made for the proper length of time.     Continue current medications. Continue good therapeutic lifestyle changes which include good diet and exercise. Fall precautions discussed with patient. If an FOBT was given today- please return it to our front desk. If you are over 86 years old - you may need Prevnar 8 or the adult Pneumonia vaccine.  **Flu shots are available--- please call and schedule a FLU-CLINIC appointment**  After your visit with [place] today you will receive a survey in the mail or online from [employer] regarding your care with [place]. Please take a moment to fill this out. Your feedback is very  important to [place] as you can help [place] better understand your patient needs as well as improve your experience and satisfaction. WE CARE ABOUT YOU!!!   Continue to follow-up with cardiology Continue to follow-up with clinical pharmacy to get the blood sugar under best control possible Continue with aggressive therapeutic lifestyle changes including medicines diet and exercise For the remainder of the winter practice good pulmonary hygiene and hand hygiene

## 2016-08-04 NOTE — Progress Notes (Signed)
Subjective:    Patient ID: Marvin Frost, male    DOB: [birth year], 71 y.o.   MRN: [id number]  HPI Pt here for follow up and management of chronic medical problems which includes diabetes, hyperlipidemia and hypertension. He is taking medication regularly.The patient is doing well today overall. He is having left knee problems and is being followed by the orthopedic surgeon for this. He is due today to get a rectal exam and to check his prostate. He will also get lab work today including a PSA. He recently had his A1c checked in November by the clinical pharmacists and it was 7.7%. He is requesting refills on all his medicines. The patient is followed closely by the clinical pharmacists and we alternate visits between her and the provider every 6 months. The patient's blood sugars at home he says have been running between 1:30 and 160 on the average fasting. He is having problems with his left knee and he is followed closely by Dr. Ihor Gully for this. He sees a cardiologist yearly and that visit is coming up sometime in May. He denies any chest pain pressure or tightness. He denies any shortness of breath. He denies any problems with his stomach including nausea vomiting diarrhea blood in the stool or black tarry bowel movements. He denies any change in bowel habits. He is passing his water without problems. 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 He is doing well with this currently.    Patient Active Problem List   Diagnosis Date Noted  . 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 Primary osteoarthritis of both knees 01/26/2015  . Nonspecific (abnormal) findings on radiological and other examination of gastrointestinal tract 09/27/2010  . NAUSEA AND VOMITING 08/16/2010  . Type 2 diabetes mellitus not at goal [facility]) 03/18/2009  . HYPERLIPIDEMIA 03/18/2009  . OBESITY 03/18/2009  . Essential hypertension 03/18/2009  . Coronary atherosclerosis 03/18/2009  . [facility] 03/18/2009  . GERD 03/18/2009    Outpatient Encounter Prescriptions as of 08/04/2016  Medication Sig  . aspirin 81 MG tablet Take 81 mg by mouth daily.  . Blood Glucose Monitoring Suppl (FREESTYLE LITE) DEVI Use to check BS bid. DX. E11.9  . cetirizine (ZYRTEC) 10 MG tablet Take 10 mg by mouth as needed.   . Cholecalciferol (VITAMIN D3) 5000 UNITS TABS Take 1 tablet by mouth daily.   . Empagliflozin-Metformin HCl (SYNJARDY) 12.10-998 MG TABS Take 1 tablet by mouth 2 (two) times daily.  . Evolocumab (REPATHA SURECLICK) 802 MG/ML SOAJ Inject 140 mg into the skin every 14 (fourteen) days.  Marland Kitchen glucose blood (FREESTYLE LITE) test strip USE TO CHECK BLOOD SUGAR TWICE A DAY AND AS NEEDED  . insulin aspart (NOVOLOG FLEXPEN) 100 UNIT/ML FlexPen Inject 16-20 Units into the skin 2 (two) times daily.  . Insulin Degludec (TRESIBA FLEXTOUCH) 200 UNIT/ML SOPN Inject 60-70 Units into the skin daily.  . Lancets (FREESTYLE) lancets Check BS BID and PRN. DX.E11.9  . Multiple Vitamin (MULTIVITAMIN) capsule Take 1 capsule by mouth daily after supper.   Marland Kitchen NOVOTWIST 32G X 5 MM MISC USE WITH INSULIN PENS UP TO FIVE TIMES PER DAY  . olmesartan-hydrochlorothiazide (BENICAR HCT) 40-25 MG tablet Take 1 tablet by mouth daily.  . pantoprazole (PROTONIX) 40 MG tablet Take 1 tablet (40 mg total) by mouth 2 (two) times daily.  . Probiotic Product (ALIGN) 4 MG CAPS Take 1 capsule by mouth every morning.  . rosuvastatin (CRESTOR) 10 MG tablet Take 1 tablet (10  mg total) by mouth every 3 (three) days.  . [DISCONTINUED] azithromycin (ZITHROMAX) 250 MG tablet Take 2 tablets on day 1 and 1 tablet daily after that   No facility-administered encounter medications on file as of 08/04/2016.      Review of Systems  Constitutional: Negative.   HENT: Negative.   Eyes: Negative.   Respiratory: Negative.   Cardiovascular: Negative.   Gastrointestinal: Negative.   Endocrine: Negative.   Genitourinary: Negative.   Musculoskeletal: Positive for arthralgias (left knee  pain - Dr Alvan Dame).  Skin: Negative.   Allergic/Immunologic: Negative.   Neurological: Negative.   Hematological: Negative.   Psychiatric/Behavioral: Negative.        Objective:   Physical Exam  Constitutional: He is oriented to person, place, and time. He appears well-developed and well-nourished. No distress.  The patient is pleasant and alert and appreciative of the care that he gets from our clinical pharmacist  HENT:  Head: Normocephalic and atraumatic.  Right Ear: External ear normal.  Left Ear: External ear normal.  Nose: Nose normal.  Mouth/Throat: Oropharynx is clear and moist. No oropharyngeal exudate.  Eyes: Conjunctivae and EOM are normal. Pupils are equal, round, and reactive to light. Right eye exhibits no discharge. Left eye exhibits no discharge. No scleral icterus.  Neck: Normal range of motion. Neck supple. No thyromegaly present.  Cardiovascular: Normal rate, regular rhythm, normal heart sounds and intact distal pulses.   No murmur heard. The heart is regular at 72/m  Pulmonary/Chest: Effort normal and breath sounds normal. No respiratory distress. He has no wheezes. He has no rales. He exhibits no tenderness.  Lungs are clear anteriorly and posteriorly and there is no axillary adenopathy  Abdominal: Soft. Bowel sounds are normal. He exhibits no mass. There is no tenderness. There is no rebound and no guarding.  Abdomen is obese. The patient has lost weight since his last visit. He still has a BMI that is in the morbid obese GERD because of comorbid risk factors of hypertension and hyperlipidemia. There are no masses tenderness or organ enlargement.  Genitourinary: Rectum normal and penis normal.  Genitourinary Comments: The prostate is enlarged but soft and smooth. There are no rectal masses. The external genitalia were within normal limits and there were no inguinal hernias palpable or inguinal nodes palpable.  Musculoskeletal: Normal range of motion. He exhibits no  edema or tenderness.  Lymphadenopathy:    He has no cervical adenopathy.  Neurological: He is alert and oriented to person, place, and time. He has normal reflexes. No cranial nerve deficit.  Skin: Skin is warm and dry. No rash noted.  Psychiatric: He has a normal mood and affect. His behavior is normal. Judgment and thought content normal.  Nursing note and vitals reviewed.   BP 136/75 (BP Location: Left Arm)   Pulse 91   Temp 97.1 F (36.2 C) (Oral)   Ht 5' 8"  (1.727 m)   Wt 227 lb (103 kg)   BMI 34.52 kg/m        Assessment & Plan:  1. Type 2 diabetes mellitus not at goal [facility]) -Continue aggressive therapeutic lifestyle changes. Continue regular follow-up with diabetic educator. - BMP8+EGFR - CBC with Differential/Platelet  2. History of ASCVD -Continue follow-up with cardiology and aggressive therapeutic lifestyle changes with cholesterol control - CBC with Differential/Platelet  3. Combined hyperlipidemia associated with type 2 diabetes mellitus ([place]) -Continue with aggressive therapeutic lifestyle changes and current treatment - CBC with Differential/Platelet - Hepatic function panel  4. Low  serum vitamin D -Continue with vitamin D replacement pending results of lab work - CBC with Differential/Platelet - VITAMIN D 25 Hydroxy (Vit-D Deficiency, Fractures)  5. Gastroesophageal reflux disease, esophagitis presence not specified -There is no complaints with reflux today. He will continue with his pantoprazole. - CBC with Differential/Platelet - Hepatic function panel  6. Benign prostatic hyperplasia, unspecified whether lower urinary tract symptoms present -No symptoms with his enlarged prostate and on exam today no lumps or masses. - CBC with Differential/Platelet - PSA, total and free - Urinalysis, Complete  Meds ordered this encounter  Medications  . rosuvastatin (CRESTOR) 10 MG tablet    Sig: Take 1 tablet (10 mg total) by mouth every 3 (three) days.     Dispense:  90 tablet    Refill:  3  . pantoprazole (PROTONIX) 40 MG tablet    Sig: Take 1 tablet (40 mg total) by mouth 2 (two) times daily.    Dispense:  180 tablet    Refill:  3  . olmesartan-hydrochlorothiazide (BENICAR HCT) 40-25 MG tablet    Sig: Take 1 tablet by mouth daily.    Dispense:  90 tablet    Refill:  3  . Insulin Pen Needle (NOVOTWIST) 32G X 5 MM MISC    Sig: USE WITH INSULIN PENS UP TO FIVE TIMES PER DAY    Dispense:  300 each    Refill:  1    E11.9  . Lancets (FREESTYLE) lancets    Sig: Check BS BID and PRN. DX.E11.9    Dispense:  300 each    Refill:  3  . Insulin Degludec (TRESIBA FLEXTOUCH) 200 UNIT/ML SOPN    Sig: Inject 60-70 Units into the skin daily.    Dispense:  60 mL    Refill:  3  . insulin aspart (NOVOLOG FLEXPEN) 100 UNIT/ML FlexPen    Sig: Inject 16-20 Units into the skin 2 (two) times daily.    Dispense:  45 mL    Refill:  3  . Empagliflozin-Metformin HCl (SYNJARDY) 12.10-998 MG TABS    Sig: Take 1 tablet by mouth 2 (two) times daily.    Dispense:  180 tablet    Refill:  3    To replace [employer] due to insurance formulary  . glucose blood (FREESTYLE LITE) test strip    Sig: USE TO CHECK BLOOD SUGAR TWICE A DAY AND AS NEEDED    Dispense:  300 each    Refill:  3    E11.9   Patient Instructions                       Medicare Annual Wellness Visit   and the medical providers at [facility] strive to bring you the best medical care.  In doing so we not only want to address your current medical conditions and concerns but also to detect new conditions early and prevent illness, disease and health-related problems.    Medicare offers a yearly Wellness Visit which allows our clinical staff to assess your need for preventative services including immunizations, lifestyle education, counseling to decrease risk of preventable diseases and screening for fall risk and other medical concerns.    This visit is provided  free of charge (no copay) for all Medicare recipients. The clinical pharmacists at [facility] have begun to conduct these Wellness Visits which will also include a thorough review of all your medications.    As you primary medical provider recommend  that you make an appointment for your Annual Wellness Visit if you have not done so already this year.  You may set up this appointment before you leave today or you may call back [phone]) and schedule an appointment.  Please make sure when you call that you mention that you are scheduling your Annual Wellness Visit with the clinical pharmacist so that the appointment may be made for the proper length of time.     Continue current medications. Continue good therapeutic lifestyle changes which include good diet and exercise. Fall precautions discussed with patient. If an FOBT was given today- please return it to our front desk. If you are over 86 years old - you may need Prevnar 49 or the adult Pneumonia vaccine.  **Flu shots are available--- please call and schedule a FLU-CLINIC appointment**  After your visit with [place] today you will receive a survey in the mail or online from [employer] regarding your care with [place]. Please take a moment to fill this out. Your feedback is very important to [place] as you can help [place] better understand your patient needs as well as improve your experience and satisfaction. WE CARE ABOUT YOU!!!   Continue to follow-up with cardiology Continue to follow-up with clinical pharmacy to get the blood sugar under best control possible Continue with aggressive therapeutic lifestyle changes including medicines diet and exercise For the remainder of the winter practice good pulmonary hygiene and hand hygiene    Arrie Senate MD

## 2016-08-04 NOTE — Addendum Note (Signed)
Addended by: Zannie Cove on: 08/04/2016 12:05 PM   Modules accepted: Orders

## 2016-08-05 LAB — BMP8+EGFR
BUN/Creatinine Ratio: 17 (ref 10–24)
BUN: 16 mg/dL (ref 8–27)
CALCIUM: 9.3 mg/dL (ref 8.6–10.2)
CHLORIDE: 99 mmol/L (ref 96–106)
CO2: 19 mmol/L (ref 18–29)
CREATININE: 0.93 mg/dL (ref 0.76–1.27)
GFR calc Af Amer: 94 mL/min/{1.73_m2} (ref >59)
GFR calc non Af Amer: 82 mL/min/{1.73_m2} (ref >59)
GLUCOSE: 170 mg/dL — AB (ref 65–99)
Potassium: 4.3 mmol/L (ref 3.5–5.2)
Sodium: 139 mmol/L (ref 134–144)

## 2016-08-05 LAB — CBC WITH DIFFERENTIAL/PLATELET
BASOS ABS: 0.1 10*3/uL (ref 0.0–0.2)
Basos: 1 %
EOS (ABSOLUTE): 0.4 10*3/uL (ref 0.0–0.4)
EOS: 5 %
HEMOGLOBIN: 12.6 g/dL — AB (ref 13.0–17.7)
Hematocrit: 40.1 % (ref 37.5–51.0)
IMMATURE GRANS (ABS): 0.1 10*3/uL (ref 0.0–0.1)
IMMATURE GRANULOCYTES: 1 %
LYMPHS: 18 %
Lymphocytes Absolute: 1.5 10*3/uL (ref 0.7–3.1)
MCH: 22.8 pg — ABNORMAL LOW (ref 26.6–33.0)
MCHC: 31.4 g/dL — ABNORMAL LOW (ref 31.5–35.7)
MCV: 73 fL — ABNORMAL LOW (ref 79–97)
MONOCYTES: 8 %
Monocytes Absolute: 0.6 10*3/uL (ref 0.1–0.9)
Neutrophils Absolute: 5.6 10*3/uL (ref 1.4–7.0)
Neutrophils: 67 %
Platelets: 373 10*3/uL (ref 150–379)
RBC: 5.53 x10E6/uL (ref 4.14–5.80)
RDW: 18.3 % — ABNORMAL HIGH (ref 12.3–15.4)
WBC: 8.3 10*3/uL (ref 3.4–10.8)

## 2016-08-05 LAB — HEPATIC FUNCTION PANEL
ALBUMIN: 4.3 g/dL (ref 3.5–4.8)
ALT: 18 IU/L (ref 0–44)
AST: 20 IU/L (ref 0–40)
Alkaline Phosphatase: 61 IU/L (ref 39–117)
BILIRUBIN, DIRECT: 0.09 mg/dL (ref 0.00–0.40)
Bilirubin Total: 0.3 mg/dL (ref 0.0–1.2)
TOTAL PROTEIN: 6.9 g/dL (ref 6.0–8.5)

## 2016-08-05 LAB — PSA, TOTAL AND FREE
PSA FREE: 0.52 ng/mL
PSA, Free Pct: 27.4 %
Prostate Specific Ag, Serum: 1.9 ng/mL (ref 0.0–4.0)

## 2016-08-05 LAB — VITAMIN D 25 HYDROXY (VIT D DEFICIENCY, FRACTURES): VIT D 25 HYDROXY: 42 ng/mL (ref 30.0–100.0)

## 2016-08-09 ENCOUNTER — Ambulatory Visit: Payer: Medicare Other | Admitting: Family Medicine

## 2016-08-10 DIAGNOSIS — M25562 Pain in left knee: Secondary | ICD-10-CM | POA: Diagnosis not present

## 2016-08-10 DIAGNOSIS — M1712 Unilateral primary osteoarthritis, left knee: Secondary | ICD-10-CM | POA: Diagnosis not present

## 2016-08-10 DIAGNOSIS — G8929 Other chronic pain: Secondary | ICD-10-CM | POA: Diagnosis not present

## 2016-09-25 ENCOUNTER — Ambulatory Visit (INDEPENDENT_AMBULATORY_CARE_PROVIDER_SITE_OTHER): Payer: Medicare Other | Admitting: Family

## 2016-09-25 ENCOUNTER — Encounter: Payer: Self-pay | Admitting: Family

## 2016-09-25 VITALS — BP 133/80 | HR 79 | Temp 97.4°F | Ht 68.0 in | Wt 231.0 lb

## 2016-09-25 DIAGNOSIS — J069 Acute upper respiratory infection, unspecified: Secondary | ICD-10-CM | POA: Diagnosis not present

## 2016-09-25 MED ORDER — BENZONATATE 200 MG PO CAPS: 200.0000 mg | ORAL_CAPSULE | Freq: Three times a day (TID) | ORAL | 1 refills | Status: DC | PRN

## 2016-09-25 MED ORDER — AZITHROMYCIN 250 MG PO TABS: ORAL_TABLET | ORAL | 0 refills | Status: DC

## 2016-09-25 NOTE — Progress Notes (Signed)
   Subjective:    Patient ID: Marvin Frost, male    DOB: February 03, 1946, 71 y.o.   MRN: [id number]  Cough  The current episode started in the past 7 days. The problem has been gradually worsening. The problem occurs every few minutes. Associated symptoms include ear congestion, a fever, myalgias, nasal congestion, postnasal drip, rhinorrhea, a sore throat and wheezing. Pertinent negatives include no chills or ear pain. The symptoms are aggravated by lying down. He has tried OTC cough suppressant and rest for the symptoms. The treatment provided mild relief. There is no history of COPD.      Review of Systems  Constitutional: Positive for fever. Negative for chills.  HENT: Positive for postnasal drip, rhinorrhea and sore throat. Negative for ear pain.   Respiratory: Positive for cough and wheezing.   Musculoskeletal: Positive for myalgias.  All other systems reviewed and are negative.      Objective:   Physical Exam  Constitutional: He is oriented to person, place, and time. He appears well-developed and well-nourished. No distress.  HENT:  Head: Normocephalic.  Right Ear: External ear normal.  Left Ear: External ear normal.  Nose: Mucosal edema and rhinorrhea present.  Mouth/Throat: Posterior oropharyngeal erythema present.  Eyes: Pupils are equal, round, and reactive to light. Right eye exhibits no discharge. Left eye exhibits no discharge.  Neck: Normal range of motion. Neck supple. No thyromegaly present.  Cardiovascular: Normal rate, regular rhythm, normal heart sounds and intact distal pulses.   No murmur heard. Pulmonary/Chest: Effort normal and breath sounds normal. No respiratory distress. He has no wheezes.  Intermittent nonproductive cough   Abdominal: Soft. Bowel sounds are normal. He exhibits no distension. There is no tenderness.  Musculoskeletal: Normal range of motion. He exhibits no edema or tenderness.  Neurological: He is alert and oriented to person, place, and time.    Skin: Skin is warm and dry. No rash noted. No erythema.  Psychiatric: He has a normal mood and affect. His behavior is normal. Judgment and thought content normal.  Vitals reviewed.     BP (!) 144/84   Pulse 81   Temp 97.4 F (36.3 C) (Oral)   Ht 5\' 8"  (1.727 m)   Wt 231 lb (104.8 kg)   BMI 35.12 kg/m      Assessment & Plan:  1. Upper respiratory tract infection, unspecified type - Take meds as prescribed - Use a cool mist humidifier  -Use saline nose sprays frequently -Saline irrigations of the nose can be very helpful if done frequently.  * 4X daily for 1 week*  * Use of a nettie pot can be helpful with this. Follow directions with this* -Force fluids -For any cough or congestion  Use plain Mucinex- regular strength or max strength is fine   * Children- consult with Pharmacist for dosing -For fever or aces or pains- take tylenol or ibuprofen appropriate for age and weight.  * for fevers greater than 101 orally you may alternate ibuprofen and tylenol every  3 hours. -Throat lozenges if help -New toothbrush in 3 days - azithromycin (ZITHROMAX) 250 MG tablet; Take 500 mg once, then 250 mg for four days  Dispense: 6 tablet; Refill: 0 - benzonatate (TESSALON) 200 MG capsule; Take 1 capsule (200 mg total) by mouth 3 (three) times daily as needed.  Dispense: 30 capsule; Refill: [place], FNP

## 2016-09-25 NOTE — Patient Instructions (Signed)
Upper Respiratory Infection, Adult Most upper respiratory infections (URIs) are a viral infection of the air passages leading to the lungs. A URI affects the nose, throat, and upper air passages. The most common type of URI is nasopharyngitis and is typically referred to as "the common cold." URIs run their course and usually go away on their own. Most of the time, a URI does not require medical attention, but sometimes a bacterial infection in the upper airways can follow a viral infection. This is called a secondary infection. Sinus and middle ear infections are common types of secondary upper respiratory infections. Bacterial pneumonia can also complicate a URI. A URI can worsen asthma and chronic obstructive pulmonary disease (COPD). Sometimes, these complications can require emergency medical care and may be life threatening. What are the causes? Almost all URIs are caused by viruses. A virus is a type of germ and can spread from one person to another. What increases the risk? You may be at risk for a URI if:  You smoke.  You have chronic heart or lung disease.  You have a weakened defense (immune) system.  You are very young or very old.  You have nasal allergies or asthma.  You work in crowded or poorly ventilated areas.  You work in health care facilities or schools.  What are the signs or symptoms? Symptoms typically develop 2-3 days after you come in contact with a cold virus. Most viral URIs last 7-10 days. However, viral URIs from the influenza virus (flu virus) can last 14-18 days and are typically more severe. Symptoms may include:  Runny or stuffy (congested) nose.  Sneezing.  Cough.  Sore throat.  Headache.  Fatigue.  Fever.  Loss of appetite.  Pain in your forehead, behind your eyes, and over your cheekbones (sinus pain).  Muscle aches.  How is this diagnosed? Your health care provider may diagnose a URI by:  Physical exam.  Tests to check that your  symptoms are not due to another condition such as: ? Strep throat. ? Sinusitis. ? Pneumonia. ? Asthma.  How is this treated? A URI goes away on its own with time. It cannot be cured with medicines, but medicines may be prescribed or recommended to relieve symptoms. Medicines may help:  Reduce your fever.  Reduce your cough.  Relieve nasal congestion.  Follow these instructions at home:  Take medicines only as directed by your health care provider.  Gargle warm saltwater or take cough drops to comfort your throat as directed by your health care provider.  Use a warm mist humidifier or inhale steam from a shower to increase air moisture. This may make it easier to breathe.  Drink enough fluid to keep your urine clear or pale yellow.  Eat soups and other clear broths and maintain good nutrition.  Rest as needed.  Return to work when your temperature has returned to normal or as your health care provider advises. You may need to stay home longer to avoid infecting others. You can also use a face mask and careful hand washing to prevent spread of the virus.  Increase the usage of your inhaler if you have asthma.  Do not use any tobacco products, including cigarettes, chewing tobacco, or electronic cigarettes. If you need help quitting, ask your health care provider. How is this prevented? The best way to protect yourself from getting a cold is to practice good hygiene.  Avoid oral or hand contact with people with cold symptoms.  Wash your   hands often if contact occurs.  There is no clear evidence that vitamin C, vitamin E, echinacea, or exercise reduces the chance of developing a cold. However, it is always recommended to get plenty of rest, exercise, and practice good nutrition. Contact a health care provider if:  You are getting worse rather than better.  Your symptoms are not controlled by medicine.  You have chills.  You have worsening shortness of breath.  You have  brown or red mucus.  You have yellow or brown nasal discharge.  You have pain in your face, especially when you bend forward.  You have a fever.  You have swollen neck glands.  You have pain while swallowing.  You have white areas in the back of your throat. Get help right away if:  You have severe or persistent: ? Headache. ? Ear pain. ? Sinus pain. ? Chest pain.  You have chronic lung disease and any of the following: ? Wheezing. ? Prolonged cough. ? Coughing up blood. ? A change in your usual mucus.  You have a stiff neck.  You have changes in your: ? Vision. ? Hearing. ? Thinking. ? Mood. This information is not intended to replace advice given to you by your health care provider. Make sure you discuss any questions you have with your health care provider. Document Released: 12/06/2000 Document Revised: 02/13/2016 Document Reviewed: 09/17/2013 [employer]vier Interactive Patient Education  2017 [employer]  

## 2016-10-09 ENCOUNTER — Ambulatory Visit (INDEPENDENT_AMBULATORY_CARE_PROVIDER_SITE_OTHER): Payer: Medicare Other | Admitting: Family Medicine

## 2016-10-09 ENCOUNTER — Encounter: Payer: Self-pay | Admitting: Family Medicine

## 2016-10-09 VITALS — BP 138/81 | HR 79 | Temp 97.5°F | Ht 68.0 in | Wt 232.0 lb

## 2016-10-09 DIAGNOSIS — S90921A Unspecified superficial injury of right foot, initial encounter: Secondary | ICD-10-CM

## 2016-10-09 DIAGNOSIS — W57XXXA Bitten or stung by nonvenomous insect and other nonvenomous arthropods, initial encounter: Secondary | ICD-10-CM

## 2016-10-09 MED ORDER — DOXYCYCLINE HYCLATE 100 MG PO TABS: 100.0000 mg | ORAL_TABLET | Freq: Two times a day (BID) | ORAL | 0 refills | Status: DC

## 2016-10-09 NOTE — Progress Notes (Addendum)
BP 138/81   Pulse 79   Temp 97.5 F (36.4 C) (Oral)   Ht 5\' 8"  (1.727 m)   Wt 232 lb (105.2 kg)   BMI 35.28 kg/m    Subjective:    Patient ID: Marvin Frost, male    DOB: 04-Sep-1945, 71 y.o.   MRN: [id number]  HPI: Marvin Frost is a 71 y.o. male presenting on 10/09/2016 for Tick Removal (right foot, between 2nd and 3rd toe)    Patient presents with blister between 2nd and 3rd toe of right foot. Patient first noticed blister last night because it was itchy. Patient reports that he removed a small pin-head sized tick from that same spot one week ago. Patient states that the area had been itchy since the tick was removed but had stopped itching prior to last night. Patient denies arthralgias, myalgias, rashes, chills, or fever. Patient was recently treated for URI last week and given Z-Pak which he finished a few days ago.   Relevant past medical, surgical, family and social history reviewed and updated as indicated. Interim medical history since our last visit reviewed. Allergies and medications reviewed and updated.  Review of Systems  Constitutional: Negative.  Negative for chills and fever.  HENT: Negative.   Eyes: Negative.   Respiratory: Negative.   Cardiovascular: Negative.   Gastrointestinal: Negative.   Endocrine: Negative.   Genitourinary: Negative.   Musculoskeletal: Negative.  Negative for arthralgias, myalgias and neck stiffness.  Skin: Negative for rash.       Blister between 2nd and 3rd toe on right foot  Neurological: Negative.     Per HPI unless specifically indicated above       Objective:    BP 138/81   Pulse 79   Temp 97.5 F (36.4 C) (Oral)   Ht 5\' 8"  (1.727 m)   Wt 232 lb (105.2 kg)   BMI 35.28 kg/m   Wt Readings from Last 3 Encounters:  10/09/16 232 lb (105.2 kg)  09/25/16 231 lb (104.8 kg)  08/04/16 227 lb (103 kg)    Physical Exam  Constitutional: He is oriented to person, place, and time. He appears well-developed and well-nourished. No  distress.  HENT:  Head: Normocephalic and atraumatic.  Eyes: Conjunctivae are normal.  Neck: Normal range of motion.  Cardiovascular: Normal rate, regular rhythm, normal heart sounds and intact distal pulses.  Exam reveals no gallop and no friction rub.   No murmur heard. Pulmonary/Chest: Effort normal and breath sounds normal. No respiratory distress. He has no wheezes.  Musculoskeletal: Normal range of motion.  Neurological: He is alert and oriented to person, place, and time.  Skin: Skin is warm and dry. No rash noted. No erythema.     Psychiatric: He has a normal mood and affect. His behavior is normal.    Procedural Note:   Drainage of bullae: 22g needle used to drain bullae. Sterile gauze placed between 2nd-3rd toes. Patient informed to use triple-antibiotic ointment and gauze on area and to change dressing daily. Patient informed to monitor area for changes and to return if there is pain, erythema, swelling, color changes, or drainage.     Assessment & Plan:   Problem List Items Addressed This Visit    None    Visit Diagnoses    Tick bite, initial encounter    -  Primary   between 2nd and 3rd toe. will give doxyc   Relevant Medications   doxycycline (VIBRA-TABS) 100 MG tablet      Patient  seen and examined with Nuala Alpha PA student. Agree with assessment and plan, no changes. Lancing of the bulla was performed by myself. Caryl Pina, MD [facility] 10/11/2016, 1:00 PM     Follow up plan: Return if symptoms worsen or fail to improve.  Counseling provided for all of the vaccine components No orders of the defined types were placed in this encounter.

## 2016-11-02 ENCOUNTER — Ambulatory Visit (INDEPENDENT_AMBULATORY_CARE_PROVIDER_SITE_OTHER): Payer: Medicare Other | Admitting: Pharmacist

## 2016-11-02 ENCOUNTER — Encounter: Payer: Self-pay | Admitting: Pharmacist

## 2016-11-02 VITALS — BP 138/80 | HR 88 | Ht 68.0 in | Wt 226.0 lb

## 2016-11-02 DIAGNOSIS — E1159 Type 2 diabetes mellitus with other circulatory complications: Secondary | ICD-10-CM

## 2016-11-02 DIAGNOSIS — E785 Hyperlipidemia, unspecified: Secondary | ICD-10-CM

## 2016-11-02 DIAGNOSIS — E1169 Type 2 diabetes mellitus with other specified complication: Secondary | ICD-10-CM | POA: Diagnosis not present

## 2016-11-02 DIAGNOSIS — D649 Anemia, unspecified: Secondary | ICD-10-CM | POA: Diagnosis not present

## 2016-11-02 LAB — BAYER DCA HB A1C WAIVED: HB A1C (BAYER DCA - WAIVED): 7.4 % — ABNORMAL HIGH (ref <7.0)

## 2016-11-02 MED ORDER — PANTOPRAZOLE SODIUM 40 MG PO TBEC: 40.0000 mg | DELAYED_RELEASE_TABLET | Freq: Two times a day (BID) | ORAL | 3 refills | Status: DC

## 2016-11-02 NOTE — Patient Instructions (Signed)
Hypoglycemia Hypoglycemia occurs when the level of sugar (glucose) in the blood is too low. Glucose is a type of sugar that provides the body's main source of energy. Certain hormones (insulin and glucagon) control the level of glucose in the blood. Insulin lowers blood glucose, and glucagon increases blood glucose. Hypoglycemia can result from having too much insulin in the bloodstream, or from not eating enough food that contains glucose. Hypoglycemia can happen in people who do or do not have diabetes. It can develop quickly, and it can be a medical emergency. What are the causes? Hypoglycemia occurs most often in people who have diabetes. If you have diabetes, hypoglycemia may be caused by:  Diabetes medicine. More likely to occur with insulin.  Not eating enough, or not eating often enough.  Increased physical activity.  Drinking alcohol, especially when you have not eaten recently. What increases the risk? Hypoglycemia is more likely to develop in:  People who have diabetes and take medicines to lower blood glucose.  People who abuse alcohol.  People who have a severe illness. What are the signs or symptoms? Hypoglycemia may not cause any symptoms. If you have symptoms, they may include:  Hunger.  Anxiety.  Sweating and feeling clammy.  Confusion.  Dizziness or feeling light-headed.  Sleepiness.  Nausea.  Increased heart rate.  Headache.  Blurry vision.  Seizure.  Nightmares.  Tingling or numbness around the mouth, lips, or tongue.  A change in speech.  Decreased ability to concentrate.  A change in coordination.  Restless sleep.  Tremors or shakes.  Fainting.  Irritability. How is this treated Low Blood glucose (Blood glucose less than 70)? This condition can often be treated by immediately eating or drinking something that contains glucose / sugar, such as:  3-4 sugar tablets (glucose pills).  Glucose gel, 15-gram tube.  Fruit juice, 4 oz  (120 mL).  Regular soda (not diet soda), 4 oz (120 mL).  Low-fat milk, 4 oz (120 mL).  Several pieces of hard candy.  Sugar or honey, 1 Tbsp. Treating Hypoglycemia If You Have Diabetes  If you are alert and able to swallow safely, follow the 15:15 rule:  Take 15 grams of a rapid-acting carbohydrate.  Rapid-acting options include:  1 tube of glucose gel.  3 glucose pills.  6-8 pieces of hard candy.  4 oz (120 mL) of fruit juice.  4 oz (120 ml) of regular (not diet) soda.  Check your blood glucose 15 to 20 minutes after you take the carbohydrate.  If the repeat blood glucose level is still at or below 70 mg/dL (3.9 mmol/L), take 15 grams of a carbohydrate again.  If your blood glucose level does not increase above 70 mg/dL (3.9 mmol/L) after 3 tries, seek emergency medical care.  After your blood glucose level returns to normal, eat a meal or a snack within 1 hour. Treating Severe Hypoglycemia  Severe hypoglycemia is when your blood glucose level is at or below 54 mg/dL (3 mmol/L). Severe hypoglycemia is an emergency. Do not wait to see if the symptoms will go away. Get medical help right away. Call your local emergency services (911 in the [place] Do not drive yourself to the hospital. If you have severe hypoglycemia and you cannot eat or drink, you may need an injection of glucagon. A family member or close friend should learn how to check your blood glucose and how to give you a glucagon injection. Ask your health care provider if you need to have an  emergency glucagon injection kit available. Severe hypoglycemia may need to be treated in a hospital. The treatment may include getting glucose through an IV tube. You may also need treatment for the cause of your hypoglycemia. Follow these instructions at home: General instructions  Avoid any diets that cause you to not eat enough food. Talk with your health care provider before you start any new diet.  Take over-the-counter  and prescription medicines only as told by your health care provider.  Limit alcohol intake to no more than 1 drink per day for nonpregnant women and 2 drinks per day for men. One drink equals 12 oz of beer, 5 oz of wine, or 1 oz of hard liquor.  Keep all follow-up visits as told by your health care provider. This is important. If You Have Diabetes:   Make sure you know the symptoms of hypoglycemia.  Always have a rapid-acting carbohydrate snack with you to treat low blood sugar.  Follow your diabetes management plan, as told by your health care provider. Make sure you:  Take your medicines as directed.  Follow your exercise plan.  Follow your meal plan. Eat on time, and do not skip meals.  Check your blood glucose as often as directed. Make sure to check your blood glucose before and after exercise. If you exercise longer or in a different way than usual, check your blood glucose more often.  Follow your sick day plan whenever you cannot eat or drink normally. Make this plan in advance with your health care provider.  Share your diabetes management plan with people in your workplace, school, and household.  Check your urine for ketones when you are ill and as told by your health care provider.  Carry a medical alert card or wear medical alert jewelry. Contact a health care provider if:  You have problems keeping your blood glucose in your target range.  You have frequent episodes of hypoglycemia. (more than 1 episode per week) Get help right away if:  You continue to have hypoglycemia symptoms after eating or drinking something containing glucose.  Your blood glucose is at or below 54 mg/dL (3 mmol/L).  You have a seizure.  You faint. These symptoms may represent a serious problem that is an emergency. Do not wait to see if the symptoms will go away. Get medical help right away. Call your local emergency services (911 in the [place] Do not drive yourself to the hospital.    This information is not intended to replace advice given to you by your health care provider. Make sure you discuss any questions you have with your health care provider.

## 2016-11-02 NOTE — Progress Notes (Signed)
Patient ID: Marvin Frost, male   DOB: 09-24-1945, 71 y.o.   MRN: [id number]   Subjective:    Marvin Frost is a 71 y.o. male who presents for follow up  of Type 2 diabetes mellitus.   Mr. Vandam reports that he has been feeling well.  Had left knee pain from time to time and has been seeing ortho.  Received 2 steroid injections 04/2016 and 06/2016.    For diabetes he is currently taking Synjardy 12.33m/1000mg bid, Tresiba 68 units qd and Novolog 25 units bid.    Current monitoring regimen: home blood tests - 2-3 times daily Home blood sugar records: fasting range: 114 to 211 and postprandial range: 99 to 209 Any episodes of hypoglycemia? yes - reports 2 episode within the last month.  Occurred after he had worked outside for most of day.  He reports that he treated by eating something sweet but could not remember exactly what.   Known diabetic complications: cardiovascular disease  Has history of pancreatitis and congenital abnormality of pancrease Cardiovascular risk factors: advanced age (older than 574for men, 647for women), diabetes mellitus, dyslipidemia, hypertension, male gender, obesity (BMI >= 30 kg/m2) and sedentary lifestyle Eye exam current (within one year): yes Weight trend: decreasing steadily - has lost about 5 lbs voer the last month. Prior visit with CDE: yes  Current diet: diabetic, low fat/ cholesterol Current exercise: none - due to knee pain Medication Compliance?  Yes   Is He on ACE inhibitor or angiotensin II receptor blocker?  Yes    olmesartan (Benicar)  The following portions of the patient's history were reviewed and updated as appropriate: allergies, current medications and problem list.    Objective:    BP 138/80 (BP Location: Left Arm, Patient Position: Sitting, Cuff Size: Normal)   Pulse 88   Ht _0  (1.727 m)   Wt 226 lb (102.5 kg)   BMI 34.36 kg/m   A1c was 7.4% today in office  Lab Review Glucose (mg/dL)  Date Value  08/04/2016 170 (H)   02/17/2016 176 (H)  11/12/2015 170 (H)   Creatinine, Ser (mg/dL)  Date Value  08/04/2016 0.93  02/17/2016 0.97  11/12/2015 0.98   Lipid Panel     Component Value Date/Time   CHOL 97 (L) 02/17/2016 0824   CHOL 137 11/12/2015 0834   CHOL 157 12/31/2012 0857   TRIG 271 (H) 02/17/2016 0824   TRIG 184 (H) 12/31/2012 0857   HDL 44 02/17/2016 0824   HDL 45 12/31/2012 0857   CHOLHDL 2.9 11/12/2015 0834   LDLCALC 51 11/12/2015 0834   LDLCALC 52 01/19/2014 1007   LDLCALC 75 12/31/2012 0857   LDLDIRECT 70 05/23/2016 0804     Assessment:    Diabetes Mellitus type 2 DM - stable with good control but A1c still above goal of 7.0% and some episodes of hypoglycmeia  Obesity - weight is decreasing  Hyperlipidemia - good results with Repatha and rosuvastatin  Plan:    1.  Rx changes: none 2.  Education: Reviewed 'ABCs' of diabetes management (respective goals in parentheses):  A1C (<7), blood pressure (<130/80), and cholesterol (LDL <100). 3. Reviewed s/s of hypoglycemia and proper treatment and follow up 4. Continue CHO counting diet  5.  Continue to check BG 2-3  times a day.  Discussed pros and cons of CGM - [facility]  Brochure given and patient to consider.  I think he would be an excellent candidate for this given that he  give 3 to 4 injection of insulin per day  6.  Recommended increase physical activity as able 7. Follow up: 3 months  with PCP and 6 months with me.  Orders Placed This Encounter  Procedures  . BMP8+EGFR  . CBC with Differential/Platelet  . Lipid panel  . Bayer DCA Hb A1c Waived  . LDL Cholesterol, Direct

## 2016-11-03 ENCOUNTER — Other Ambulatory Visit: Payer: Self-pay | Admitting: Pharmacist

## 2016-11-03 LAB — BMP8+EGFR
BUN/Creatinine Ratio: 12 (ref 10–24)
BUN: 12 mg/dL (ref 8–27)
CALCIUM: 9.2 mg/dL (ref 8.6–10.2)
CO2: 25 mmol/L (ref 18–29)
CREATININE: 0.97 mg/dL (ref 0.76–1.27)
Chloride: 100 mmol/L (ref 96–106)
GFR calc Af Amer: 90 mL/min/{1.73_m2} (ref >59)
GFR, EST NON AFRICAN AMERICAN: 78 mL/min/{1.73_m2} (ref >59)
GLUCOSE: 177 mg/dL — AB (ref 65–99)
Potassium: 4.6 mmol/L (ref 3.5–5.2)
SODIUM: 140 mmol/L (ref 134–144)

## 2016-11-03 LAB — CBC WITH DIFFERENTIAL/PLATELET
BASOS: 2 %
Basophils Absolute: 0.2 10*3/uL (ref 0.0–0.2)
EOS (ABSOLUTE): 0.6 10*3/uL — AB (ref 0.0–0.4)
EOS: 7 %
HEMATOCRIT: 38.7 % (ref 37.5–51.0)
Hemoglobin: 11.9 g/dL — ABNORMAL LOW (ref 13.0–17.7)
IMMATURE GRANULOCYTES: 0 %
Immature Grans (Abs): 0 10*3/uL (ref 0.0–0.1)
LYMPHS: 17 %
Lymphocytes Absolute: 1.5 10*3/uL (ref 0.7–3.1)
MCH: 22.6 pg — ABNORMAL LOW (ref 26.6–33.0)
MCHC: 30.7 g/dL — AB (ref 31.5–35.7)
MCV: 74 fL — ABNORMAL LOW (ref 79–97)
MONOS ABS: 0.7 10*3/uL (ref 0.1–0.9)
Monocytes: 8 %
NEUTROS PCT: 66 %
Neutrophils Absolute: 5.8 10*3/uL (ref 1.4–7.0)
PLATELETS: 355 10*3/uL (ref 150–379)
RBC: 5.26 x10E6/uL (ref 4.14–5.80)
RDW: 19.9 % — AB (ref 12.3–15.4)
WBC: 8.7 10*3/uL (ref 3.4–10.8)

## 2016-11-03 LAB — LIPID PANEL
CHOL/HDL RATIO: 2.3 ratio (ref 0.0–5.0)
CHOLESTEROL TOTAL: 107 mg/dL (ref 100–199)
HDL: 47 mg/dL (ref >39)
LDL Calculated: 27 mg/dL (ref 0–99)
TRIGLYCERIDES: 166 mg/dL — AB (ref 0–149)
VLDL Cholesterol Cal: 33 mg/dL (ref 5–40)

## 2016-11-03 LAB — LDL CHOLESTEROL, DIRECT: LDL DIRECT: 39 mg/dL (ref 0–99)

## 2016-11-03 MED ORDER — FERROUS SULFATE 325 (65 FE) MG PO TABS: 325.0000 mg | ORAL_TABLET | ORAL | 3 refills | Status: DC

## 2016-12-03 NOTE — Progress Notes (Signed)
HPI The patient presents for follow up of nonobstructive CAD.  His last cath was 2004. His last stress test 2013.   Since I last saw him he has had some increased shortness of breath with activities. He has some mild chest congestion. He is limited in his activities because of joint problems and back pain.  he does some gardening might get short of breath.  The patient denies any new symptoms such as chest discomfort, neck or arm discomfort. There has been no PND or orthopnea. There have been no reported palpitations, presyncope or syncope.   Allergies  Allergen Reactions  . Liraglutide Other (See Comments)    Abdominal pain and enlarged pancreas on CT ? pancreatitis  . Baxinets [Cetylpyridinium-Benzocaine]     unknown  . Celebrex [Celecoxib] Nausea Only  . Clarithromycin     unkown  . Toprol Xl [Metoprolol Succinate] Other (See Comments)    Unknown   . Triplex Ad Other (See Comments)    Abdominal pain and enlarged pancreas on CT? pancreatitis  . Welchol [Colesevelam Hcl] Other (See Comments)    constipation  . Norvasc [Amlodipine Besylate] Rash  . Statins Other (See Comments)    Unknown- muscle and joint pain if taken for long periods of time, takes Crestor every 2-3 days to avoid this reaction    Current Outpatient Prescriptions  Medication Sig Dispense Refill  . aspirin 81 MG tablet Take 81 mg by mouth daily.    . Blood Glucose Monitoring Suppl (FREESTYLE LITE) DEVI Use to check BS bid. DX. E11.9 1 each 0  . cetirizine (ZYRTEC) 10 MG tablet Take 10 mg by mouth as needed.     . Cholecalciferol (VITAMIN D3) 5000 UNITS TABS Take 1 tablet by mouth daily.     . Empagliflozin-Metformin HCl (SYNJARDY) 12.10-998 MG TABS Take 1 tablet by mouth 2 (two) times daily. 180 tablet 3  . Evolocumab (REPATHA SURECLICK) 329 MG/ML SOAJ Inject 140 mg into the skin every 14 (fourteen) days. (Patient taking differently: Inject 140 mg into the skin every 30 (thirty) days. ) 6 mL 4  . ferrous sulfate  325 (65 FE) MG tablet Take 1 tablet (325 mg total) by mouth every other day.  3  . glucose blood (FREESTYLE LITE) test strip USE TO CHECK BLOOD SUGAR TWICE A DAY AND AS NEEDED 300 each 3  . insulin aspart (NOVOLOG FLEXPEN) 100 UNIT/ML FlexPen Inject 16-20 Units into the skin 2 (two) times daily. 45 mL 3  . Insulin Degludec (TRESIBA FLEXTOUCH) 200 UNIT/ML SOPN Inject 60-70 Units into the skin daily. 60 mL 3  . Insulin Pen Needle (NOVOTWIST) 32G X 5 MM MISC USE WITH INSULIN PENS UP TO FIVE TIMES PER DAY 300 each 1  . Lancets (FREESTYLE) lancets Check BS BID and PRN. DX.E11.9 300 each 3  . Multiple Vitamin (MULTIVITAMIN) capsule Take 1 capsule by mouth daily after supper.     . olmesartan-hydrochlorothiazide (BENICAR HCT) 40-25 MG tablet Take 1 tablet by mouth daily. 90 tablet 3  . pantoprazole (PROTONIX) 40 MG tablet Take 1 tablet (40 mg total) by mouth 2 (two) times daily. 180 tablet 3  . Probiotic Product (ALIGN) 4 MG CAPS Take 1 capsule by mouth every morning.    . rosuvastatin (CRESTOR) 10 MG tablet Take 1 tablet (10 mg total) by mouth every 3 (three) days. 90 tablet 3   No current facility-administered medications for this visit.     Past Medical History:  Diagnosis Date  . Arthritis   .  CAD (coronary artery disease)    Nonobstructive Cath 2004  . 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 Cataract   . Diabetes mellitus   . Dysrhythmia    history of heart palpitations   . GERD (gastroesophageal reflux disease)   . Heart palpitations    last episode- 09/2014 seen by Dr Percival Spanish 5/16- clearance for surgery   . History of frequent urinary tract infections   . History of hiatal hernia   . Hyperlipemia   . Hypertension   . Obesity   . Pancreatitis     Past Surgical History:  Procedure Laterality Date  . CARDIAC CATHETERIZATION     2004 and 1999- done by Dr Percival Spanish   . COLONOSCOPY  11/2003   diverticulosis, no polyps  . EYE SURGERY Bilateral     cateracts - dr Katy Fitch  . HYDROCELE EXCISION / REPAIR    . left calf surgery      related to trauma of being torn   . left index finger arthrioscopic surgery     . lymph node removed from right neck     . right shoulder surgery    . TOTAL HIP ARTHROPLASTY Right 03/16/2015   Procedure: RIGHT TOTAL HIP ARTHROPLASTY ANTERIOR APPROACH;  Surgeon: Paralee Cancel, MD;  Location: [facility];  Service: Orthopedics;  Laterality: Right;  . TOTAL HIP ARTHROPLASTY Left 06/22/2015   Procedure: LEFT TOTAL HIP ARTHROPLASTY ANTERIOR APPROACH;  Surgeon: Paralee Cancel, MD;  Location: [facility];  Service: Orthopedics;  Laterality: Left;  . UPPER GASTROINTESTINAL ENDOSCOPY  08/19/2010   hiatal hernia 2cm, mild gastritis    ROS:  Reflux and join pain.  Otherwise as stated in the HPI and negative for all other systems.  PHYSICAL EXAM BP 114/62   Pulse 90   Ht 5\' 8"  (1.727 m)   Wt 225 lb 12.8 oz (102.4 kg)   BMI 34.33 kg/m   GENERAL:  Well appearing NECK:  No jugular venous distention, waveform within normal limits, carotid upstroke brisk and symmetric, no bruits, no thyromegaly LUNGS:  Clear to auscultation bilaterally CHEST:  Unremarkable HEART:  PMI not displaced or sustained,S1 and S2 within normal limits, no S3, no S4, no clicks, no rubs, no murmurs ABD:  Flat, positive bowel sounds normal in frequency in pitch, no bruits, no rebound, no guarding, no midline pulsatile mass, no hepatomegaly, no splenomegaly EXT:  2 plus pulses throughout, no edema, no cyanosis no clubbing   EKG: Normal sinus rhythm, rate 90, left axis views poor anterior R-wave progression, T-wave changes.  12/04/2016   Lab Results  Component Value Date   CHOL 107 11/02/2016   TRIG 166 (H) 11/02/2016   HDL 47 11/02/2016   LDLCALC 27 11/02/2016   LDLDIRECT 39 11/02/2016     ASSESSMENT AND PLAN  CAD -  The patient has increased SOB.  I will bring the patient back for a POET (Plain Old Exercise Test). This will allow me to screen for  obstructive coronary disease, risk stratify and very importantly provide a prescription for exercise.  HYPERLIPIDEMIA -  The lipids are excellent as above.    This is now being treated with Repatha and followed by Dr. Laurance Flatten.  HYPERTENSION -  The blood pressure is at target.  He will remain on meds as listed.   DM -  His A1C was 7.4.  I reviewed these labs.   We discussed this. I have encouraged rehab at [facility].

## 2016-12-04 ENCOUNTER — Ambulatory Visit (INDEPENDENT_AMBULATORY_CARE_PROVIDER_SITE_OTHER): Payer: Medicare Other | Admitting: Cardiology

## 2016-12-04 ENCOUNTER — Encounter: Payer: Self-pay | Admitting: Cardiology

## 2016-12-04 VITALS — BP 114/62 | HR 90 | Ht 68.0 in | Wt 225.8 lb

## 2016-12-04 DIAGNOSIS — I10 Essential (primary) hypertension: Secondary | ICD-10-CM

## 2016-12-04 DIAGNOSIS — R0609 Other forms of dyspnea: Secondary | ICD-10-CM | POA: Diagnosis not present

## 2016-12-04 DIAGNOSIS — E785 Hyperlipidemia, unspecified: Secondary | ICD-10-CM | POA: Diagnosis not present

## 2016-12-04 DIAGNOSIS — I209 Angina pectoris, unspecified: Secondary | ICD-10-CM | POA: Diagnosis not present

## 2016-12-04 DIAGNOSIS — I25119 Atherosclerotic heart disease of native coronary artery with unspecified angina pectoris: Secondary | ICD-10-CM

## 2016-12-04 NOTE — Patient Instructions (Signed)
Medication Instructions:  Continue current medications  Labwork: None Ordered  Testing/Procedures: Your physician has requested that you have an exercise tolerance test. For further information please visit [email or web address]. Please also follow instruction sheet, as given.   Follow-Up: Your physician wants you to follow-up in: 1 Year. You will receive a reminder letter in the mail two months in advance. If you don't receive a letter, please call our office to schedule the follow-up appointment.   Any Other Special Instructions Will Be Listed Below (If Applicable).   If you need a refill on your cardiac medications before your next appointment, please call your pharmacy.   

## 2016-12-26 ENCOUNTER — Telehealth (HOSPITAL_COMMUNITY): Payer: Self-pay

## 2016-12-26 NOTE — Telephone Encounter (Signed)
Close encounter 

## 2016-12-29 ENCOUNTER — Ambulatory Visit (HOSPITAL_COMMUNITY)
Admission: RE | Admit: 2016-12-29 | Discharge: 2016-12-29 | Disposition: A | Discharge status: Home / Self Care | Payer: Medicare Other | Source: Ambulatory Visit | Attending: Cardiovascular Disease | Admitting: Cardiovascular Disease

## 2016-12-29 DIAGNOSIS — R0609 Other forms of dyspnea: Secondary | ICD-10-CM | POA: Diagnosis not present

## 2016-12-30 LAB — EXERCISE TOLERANCE TEST
CHL CUP MPHR: 147 {beats}/min
CSEPED: 6 min
CSEPEDS: 5 s
CSEPHR: 89 %
CSEPPHR: 131 {beats}/min
Estimated workload: 7.1 METS
RPE: 17
Rest HR: 82 {beats}/min

## 2017-02-07 ENCOUNTER — Ambulatory Visit (INDEPENDENT_AMBULATORY_CARE_PROVIDER_SITE_OTHER): Payer: Medicare Other | Admitting: Family Medicine

## 2017-02-07 ENCOUNTER — Encounter: Payer: Self-pay | Admitting: Family Medicine

## 2017-02-07 VITALS — BP 132/72 | HR 79 | Temp 97.0°F | Ht 68.0 in | Wt 227.0 lb

## 2017-02-07 DIAGNOSIS — Z8679 Personal history of other diseases of the circulatory system: Secondary | ICD-10-CM | POA: Diagnosis not present

## 2017-02-07 DIAGNOSIS — E785 Hyperlipidemia, unspecified: Secondary | ICD-10-CM | POA: Diagnosis not present

## 2017-02-07 DIAGNOSIS — K219 Gastro-esophageal reflux disease without esophagitis: Secondary | ICD-10-CM

## 2017-02-07 DIAGNOSIS — E611 Iron deficiency: Secondary | ICD-10-CM

## 2017-02-07 DIAGNOSIS — E1169 Type 2 diabetes mellitus with other specified complication: Secondary | ICD-10-CM | POA: Diagnosis not present

## 2017-02-07 DIAGNOSIS — R252 Cramp and spasm: Secondary | ICD-10-CM | POA: Diagnosis not present

## 2017-02-07 DIAGNOSIS — E1159 Type 2 diabetes mellitus with other circulatory complications: Secondary | ICD-10-CM

## 2017-02-07 DIAGNOSIS — M6208 Separation of muscle (nontraumatic), other site: Secondary | ICD-10-CM

## 2017-02-07 DIAGNOSIS — Z6834 Body mass index (BMI) 34.0-34.9, adult: Secondary | ICD-10-CM

## 2017-02-07 DIAGNOSIS — R7989 Other specified abnormal findings of blood chemistry: Secondary | ICD-10-CM | POA: Diagnosis not present

## 2017-02-07 DIAGNOSIS — I25119 Atherosclerotic heart disease of native coronary artery with unspecified angina pectoris: Secondary | ICD-10-CM | POA: Diagnosis not present

## 2017-02-07 LAB — BAYER DCA HB A1C WAIVED: HB A1C: 8.2 % — AB (ref <7.0)

## 2017-02-07 NOTE — Addendum Note (Signed)
Addended by: Zannie Cove on: 02/07/2017 08:53 AM   Modules accepted: Orders

## 2017-02-07 NOTE — Patient Instructions (Addendum)
Medicare Annual Wellness Visit  [facility] and the medical providers at [facility] strive to bring you the best medical care.  In doing so we not only want to address your current medical conditions and concerns but also to detect new conditions early and prevent illness, disease and health-related problems.    Medicare offers a yearly Wellness Visit which allows our clinical staff to assess your need for preventative services including immunizations, lifestyle education, counseling to decrease risk of preventable diseases and screening for fall risk and other medical concerns.    This visit is provided free of charge (no copay) for all Medicare recipients. The clinical pharmacists at [facility] have begun to conduct these Wellness Visits which will also include a thorough review of all your medications.    As you primary medical provider recommend that you make an appointment for your Annual Wellness Visit if you have not done so already this year.  You may set up this appointment before you leave today or you may call back ([phone]) and schedule an appointment.  Please make sure when you call that you mention that you are scheduling your Annual Wellness Visit with the clinical pharmacist so that the appointment may be made for the proper length of time.     Continue current medications. Continue good therapeutic lifestyle changes which include good diet and exercise. Fall precautions discussed with patient. If an FOBT was given today- please return it to our front desk. If you are over 15 years old - you may need Prevnar 52 or the adult Pneumonia vaccine.  **Flu shots are available--- please call and schedule a FLU-CLINIC appointment**  After your visit with [place] today you will receive a survey in the mail or online from [employer] regarding your care with [place]. Please take a moment to fill this out. Your feedback is very  important to [place] as you can help [place] better understand your patient needs as well as improve your experience and satisfaction. WE CARE ABOUT YOU!!!   Follow-up with cardiology as planned Try to reduce caffeine and correlate eating habits and especially fresh fruits with the loose bowel movement problems. Stay active physically Drink more water!! Especially when working outside. Continue to monitor blood sugars closely and follow as aggressive therapeutic lifestyle changes as possible

## 2017-02-07 NOTE — Progress Notes (Signed)
Subjective:    Patient ID: Marvin Frost, male    DOB: 21-Mar-1946, 71 y.o.   MRN: [id number]  HPI Pt here for follow up and management of chronic medical problems which includes diabetes, hyperlipidemia and gerd. He is taking medication regularly.The patient doing well overall. He has had some episodes yesterday with low blood pressure. He is concerned that he has a possible hernia that he has a lot of muscle cramps in his legs. The patient was out mowing yesterday and came back in last night and had a couple of low blood pressure readings 116/59 and 119/50. This morning at home it was 144/71. He did check a blood sugar and this was not low during this time. His initial blood pressure here this morning was good. He says that his blood sugars at home and been running fasting between the 150 in 160. He has had a recent stress test and plans to see the cardiologist in September. He did have some shortness of breath with the stress test. He denies any chest pain at any time. He denies any nausea vomiting or blood in the stool or black tarry bowel movements. His stools have been more loose than usual and he is not sure that his diet could be playing a role with this with additional caffeine and some of the fresh fruits he is been eating he is going to try to correlate this better for [place]. He is voiding okay but does have occasional nocturia. His weight today is 227. Previously it was 226.    Patient Active Problem List   Diagnosis Date Noted  . 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 Primary osteoarthritis of both knees 01/26/2015  . Nonspecific (abnormal) findings on radiological and other examination of gastrointestinal tract 09/27/2010  . NAUSEA AND VOMITING 08/16/2010  . Type 2 diabetes mellitus not at goal [facility]) 03/18/2009  . HYPERLIPIDEMIA 03/18/2009  . OBESITY 03/18/2009  . Essential hypertension 03/18/2009  . Coronary atherosclerosis 03/18/2009  . [facility] 03/18/2009  . GERD 03/18/2009   Outpatient  Encounter Prescriptions as of 02/07/2017  Medication Sig  . aspirin 81 MG tablet Take 81 mg by mouth daily.  . Blood Glucose Monitoring Suppl (FREESTYLE LITE) DEVI Use to check BS bid. DX. E11.9  . cetirizine (ZYRTEC) 10 MG tablet Take 10 mg by mouth as needed.   . Cholecalciferol (VITAMIN D3) 5000 UNITS TABS Take 1 tablet by mouth daily.   . Empagliflozin-Metformin HCl (SYNJARDY) 12.10-998 MG TABS Take 1 tablet by mouth 2 (two) times daily.  . Evolocumab (REPATHA SURECLICK) 697 MG/ML SOAJ Inject 140 mg into the skin every 14 (fourteen) days. (Patient taking differently: Inject 140 mg into the skin every 30 (thirty) days. )  . ferrous sulfate 325 (65 FE) MG tablet Take 1 tablet (325 mg total) by mouth every other day.  Marland Kitchen glucose blood (FREESTYLE LITE) test strip USE TO CHECK BLOOD SUGAR TWICE A DAY AND AS NEEDED  . insulin aspart (NOVOLOG FLEXPEN) 100 UNIT/ML FlexPen Inject 16-20 Units into the skin 2 (two) times daily. (Patient taking differently: Inject 25 Units into the skin 2 (two) times daily. )  . Insulin Degludec (TRESIBA FLEXTOUCH) 200 UNIT/ML SOPN Inject 60-70 Units into the skin daily.  . Insulin Pen Needle (NOVOTWIST) 32G X 5 MM MISC USE WITH INSULIN PENS UP TO FIVE TIMES PER DAY  . Lancets (FREESTYLE) lancets Check BS BID and PRN. DX.E11.9  . Multiple Vitamin (MULTIVITAMIN) capsule Take 1 capsule by mouth daily after  supper.   . olmesartan-hydrochlorothiazide (BENICAR HCT) 40-25 MG tablet Take 1 tablet by mouth daily.  . pantoprazole (PROTONIX) 40 MG tablet Take 1 tablet (40 mg total) by mouth 2 (two) times daily.  . Probiotic Product (ALIGN) 4 MG CAPS Take 1 capsule by mouth every morning.  . rosuvastatin (CRESTOR) 10 MG tablet Take 1 tablet (10 mg total) by mouth every 3 (three) days.   No facility-administered encounter medications on file as of 02/07/2017.      Review of Systems  Constitutional: Negative.   HENT: Negative.   Eyes: Negative.   Respiratory: Negative.     Cardiovascular: Negative.        BP low at times (fatigue and HA )   Gastrointestinal: Negative.        Possible hernia  Endocrine: Negative.   Genitourinary: Negative.   Musculoskeletal: Positive for myalgias (leg cramps).  Skin: Negative.   Allergic/Immunologic: Negative.   Neurological: Negative.   Hematological: Negative.   Psychiatric/Behavioral: Negative.        Objective:   Physical Exam  Constitutional: He is oriented to person, place, and time. He appears well-developed and well-nourished.  The patient is pleasant and relaxed  HENT:  Head: Normocephalic and atraumatic.  Right Ear: External ear normal.  Left Ear: External ear normal.  Nose: Nose normal.  Mouth/Throat: Oropharynx is clear and moist. No oropharyngeal exudate.  Eyes: Pupils are equal, round, and reactive to light. Conjunctivae and EOM are normal. Right eye exhibits no discharge. Left eye exhibits no discharge. No scleral icterus.  Eye exams are up-to-date and he sees Dr. Katy Fitch  Neck: Normal range of motion. Neck supple. No thyromegaly present.  No bruits thyromegaly or  anterior cervical adenopathy  Cardiovascular: Normal rate, regular rhythm and intact distal pulses.   No murmur heard. Heart has a regular rate and rhythm at 84/m  Pulmonary/Chest: Effort normal and breath sounds normal. No respiratory distress. He has no wheezes. He has no rales. He exhibits no tenderness.  Clear anteriorly and posteriorly and no axillary adenopathy  Abdominal: Soft. Bowel sounds are normal. He exhibits no mass. There is no tenderness. There is no rebound and no guarding.  Diastasis recti. No epigastric tenderness or organ enlargement or inguinal adenopathy  Musculoskeletal: Normal range of motion. He exhibits no edema.  Lymphadenopathy:    He has no cervical adenopathy.  Neurological: He is alert and oriented to person, place, and time. He has normal reflexes. No cranial nerve deficit.  Skin: Skin is warm and dry. No  rash noted.  Psychiatric: He has a normal mood and affect. His behavior is normal. Judgment and thought content normal.  Nursing note and vitals reviewed.  BP 132/72 (BP Location: Left Arm)   Pulse 79   Temp (!) 97 F (36.1 C) (Oral)   Ht 5' 8"  (1.727 m)   Wt 227 lb (103 kg)   BMI 34.52 kg/m        Assessment & Plan:  1. Type 2 diabetes mellitus with other circulatory complications (HCC) -The patient's blood sugars at home have been running between 150 and 160 fasting. His weight is stable and the BMI is in the 34 range. He understands the importance of diet and exercise and good blood sugar control as well as weight control. - BMP8+EGFR - CBC with Differential/Platelet - Bayer DCA Hb A1c Waived  2. Hyperlipidemia associated with type 2 diabetes mellitus ([place]) -Continue current treatment pending results of lab work - CBC with Differential/Platelet -  Lipid panel - Hepatic function panel  3. History of ASCVD -Follow-up with cardiology as planned - CBC with Differential/Platelet - Lipid panel  4. Low serum vitamin D -Continue with current vitamin D dosing pending results of lab work - CBC with Differential/Platelet - VITAMIN D 25 Hydroxy (Vit-D Deficiency, Fractures)  5. Gastroesophageal reflux disease, esophagitis presence not specified -The patient denies any problems with nausea vomiting or heartburn. He has no abdominal pain. He does have this diastasis recti and we will continue to monitor that. - CBC with Differential/Platelet - Hepatic function panel  6. BMI 34.0-34.9,adult -Continue to work aggressively on weight through diet and exercise   Patient Instructions                       Medicare Annual Wellness Visit  [facility] and the medical providers at [facility] strive to bring you the best medical care.  In doing so we not only want to address your current medical conditions and concerns but also to detect new conditions early and  prevent illness, disease and health-related problems.    Medicare offers a yearly Wellness Visit which allows our clinical staff to assess your need for preventative services including immunizations, lifestyle education, counseling to decrease risk of preventable diseases and screening for fall risk and other medical concerns.    This visit is provided free of charge (no copay) for all Medicare recipients. The clinical pharmacists at [facility] have begun to conduct these Wellness Visits which will also include a thorough review of all your medications.    As you primary medical provider recommend that you make an appointment for your Annual Wellness Visit if you have not done so already this year.  You may set up this appointment before you leave today or you may call back ([phone]) and schedule an appointment.  Please make sure when you call that you mention that you are scheduling your Annual Wellness Visit with the clinical pharmacist so that the appointment may be made for the proper length of time.     Continue current medications. Continue good therapeutic lifestyle changes which include good diet and exercise. Fall precautions discussed with patient. If an FOBT was given today- please return it to our front desk. If you are over 27 years old - you may need Prevnar 24 or the adult Pneumonia vaccine.  **Flu shots are available--- please call and schedule a FLU-CLINIC appointment**  After your visit with [place] today you will receive a survey in the mail or online from [employer] regarding your care with [place]. Please take a moment to fill this out. Your feedback is very important to [place] as you can help [place] better understand your patient needs as well as improve your experience and satisfaction. WE CARE ABOUT YOU!!!   Follow-up with cardiology as planned Try to reduce caffeine and correlate eating habits and especially fresh fruits with the loose bowel movement  problems. Stay active physically Drink more water!! Especially when working outside. Continue to monitor blood sugars closely and follow as aggressive therapeutic lifestyle changes as possible    Arrie Senate MD   7. Diastasis recti -We will continue to monitor this at the patient has problems he will get back in touch with [place].  Arrie Senate MD

## 2017-02-08 LAB — BMP8+EGFR
BUN/Creatinine Ratio: 15 (ref 10–24)
BUN: 16 mg/dL (ref 8–27)
CO2: 22 mmol/L (ref 20–29)
Calcium: 8.9 mg/dL (ref 8.6–10.2)
Chloride: 101 mmol/L (ref 96–106)
Creatinine, Ser: 1.07 mg/dL (ref 0.76–1.27)
GFR calc Af Amer: 79 mL/min/{1.73_m2} (ref >59)
GFR, EST NON AFRICAN AMERICAN: 68 mL/min/{1.73_m2} (ref >59)
GLUCOSE: 150 mg/dL — AB (ref 65–99)
Potassium: 4.2 mmol/L (ref 3.5–5.2)
Sodium: 141 mmol/L (ref 134–144)

## 2017-02-08 LAB — CBC WITH DIFFERENTIAL/PLATELET
BASOS: 2 %
Basophils Absolute: 0.2 10*3/uL (ref 0.0–0.2)
EOS (ABSOLUTE): 0.8 10*3/uL — ABNORMAL HIGH (ref 0.0–0.4)
EOS: 9 %
HEMATOCRIT: 34.5 % — AB (ref 37.5–51.0)
HEMOGLOBIN: 10.1 g/dL — AB (ref 13.0–17.7)
IMMATURE GRANS (ABS): 0.1 10*3/uL (ref 0.0–0.1)
IMMATURE GRANULOCYTES: 1 %
Lymphocytes Absolute: 1.7 10*3/uL (ref 0.7–3.1)
Lymphs: 18 %
MCH: 20.7 pg — ABNORMAL LOW (ref 26.6–33.0)
MCHC: 29.3 g/dL — ABNORMAL LOW (ref 31.5–35.7)
MCV: 71 fL — AB (ref 79–97)
MONOCYTES: 9 %
Monocytes Absolute: 0.8 10*3/uL (ref 0.1–0.9)
NEUTROS PCT: 61 %
Neutrophils Absolute: 5.7 10*3/uL (ref 1.4–7.0)
Platelets: 516 10*3/uL — ABNORMAL HIGH (ref 150–379)
RBC: 4.87 x10E6/uL (ref 4.14–5.80)
RDW: 18.1 % — AB (ref 12.3–15.4)
WBC: 9.2 10*3/uL (ref 3.4–10.8)

## 2017-02-08 LAB — LIPID PANEL
CHOL/HDL RATIO: 3.4 ratio (ref 0.0–5.0)
Cholesterol, Total: 148 mg/dL (ref 100–199)
HDL: 43 mg/dL (ref >39)
LDL Calculated: 73 mg/dL (ref 0–99)
TRIGLYCERIDES: 161 mg/dL — AB (ref 0–149)
VLDL Cholesterol Cal: 32 mg/dL (ref 5–40)

## 2017-02-08 LAB — VITAMIN D 25 HYDROXY (VIT D DEFICIENCY, FRACTURES): VIT D 25 HYDROXY: 52.1 ng/mL (ref 30.0–100.0)

## 2017-02-08 LAB — HEPATIC FUNCTION PANEL
ALBUMIN: 4.2 g/dL (ref 3.5–4.8)
ALT: 16 IU/L (ref 0–44)
AST: 16 IU/L (ref 0–40)
Alkaline Phosphatase: 59 IU/L (ref 39–117)
Bilirubin Total: 0.3 mg/dL (ref 0.0–1.2)
Bilirubin, Direct: 0.11 mg/dL (ref 0.00–0.40)
TOTAL PROTEIN: 6.6 g/dL (ref 6.0–8.5)

## 2017-02-08 LAB — MAGNESIUM: MAGNESIUM: 1.9 mg/dL (ref 1.6–2.3)

## 2017-02-08 LAB — IRON: IRON: 20 ug/dL — AB (ref 38–169)

## 2017-02-15 ENCOUNTER — Telehealth: Payer: Self-pay | Admitting: *Deleted

## 2017-02-15 NOTE — Telephone Encounter (Signed)
-----   Message from Chipper Herb, MD sent at 02/08/2017 10:36 AM EDT ----- The blood sugar is elevated at 150. This should be ideally less than 120. The creatinine, the most important kidney function test is within normal limits. The electrolytes including potassium are good. The CBC has a normal white blood cell count. The hemoglobin is decreased at 10.1 and this is down almost 2 points from when it was previously checked 3 months ago. The platelet count is increased this time. Please add a  ferritin level and iron binding capacity to this blood work++++++ his last colonoscopy was in 2015 and everything was clear except for severe diverticulosis. Please make sure that he has returned an FOBT. Please make sure that he is avoiding all NSAIDs. The patient should have his CBC repeated in a couple of weeks and make sure the FOBT is returned.++++++ also, please make sure that he is continuing to take his iron replacement+++++ Cholesterol numbers with traditional lipid testing have a total cholesterol that is good at 148. The LDL C cholesterol is good at 73 and the good cholesterol or the HDL is good at 43. Triglycerides however are elevated at 161 and this is usually diet related. They should be less than 150. The vitamin D level is good at 52.1 and she should continue with current treatment All liver function tests are within normal limits The serum iron level was low at 20 and previously a year ago was 15. The magnesium level was within normal limits. Please call the patient with these results and recommendations. The patient must be having problems with absorbing iron appropriately. He may need referral to a hematologist. We will wait until the additional tests results are returned including the FOBT and the repeat CBC.

## 2017-02-15 NOTE — Telephone Encounter (Signed)
Pt notified of lab results Will come in for repeat CBC  Pt will also do FOBT

## 2017-02-16 ENCOUNTER — Other Ambulatory Visit: Payer: Medicare Other

## 2017-02-16 DIAGNOSIS — Z1211 Encounter for screening for malignant neoplasm of colon: Secondary | ICD-10-CM

## 2017-02-20 LAB — FECAL OCCULT BLOOD, IMMUNOCHEMICAL: Fecal Occult Bld: NEGATIVE

## 2017-02-20 NOTE — Addendum Note (Signed)
Addended by: Ilean China on: 02/20/2017 10:45 AM   Modules accepted: Orders

## 2017-03-04 NOTE — Progress Notes (Signed)
HPI The patient presents for follow up of nonobstructive CAD.  His last cath was 2004. His last stress test 2013.  At the last visit he had some dyspnea.  I sent him for a POET (Plain Old Exercise Treadmill) which was negative for ischemia.  Since I last saw him he has done well.  He does have DOE when he works hard in the yard during the heat.  However, this is not worse than it was and he things it is in keeping with his level of fitness.  The patient denies any new symptoms such as chest discomfort, neck or arm discomfort. There has been no new  PND or orthopnea. There have been no reported palpitations, presyncope or syncope.   Allergies  Allergen Reactions  . Liraglutide Other (See Comments)    Abdominal pain and enlarged pancreas on CT ? pancreatitis  . Baxinets [Cetylpyridinium-Benzocaine]     unknown  . Celebrex [Celecoxib] Nausea Only  . Clarithromycin     unkown  . Toprol Xl [Metoprolol Succinate] Other (See Comments)    Unknown   . Triplex Ad Other (See Comments)    Abdominal pain and enlarged pancreas on CT? pancreatitis  . Welchol [Colesevelam Hcl] Other (See Comments)    constipation  . Norvasc [Amlodipine Besylate] Rash  . Statins Other (See Comments)    Unknown- muscle and joint pain if taken for long periods of time, takes Crestor every 2-3 days to avoid this reaction    Current Outpatient Prescriptions  Medication Sig Dispense Refill  . aspirin 81 MG tablet Take 81 mg by mouth daily.    . Blood Glucose Monitoring Suppl (FREESTYLE LITE) DEVI Use to check BS bid. DX. E11.9 1 each 0  . cetirizine (ZYRTEC) 10 MG tablet Take 10 mg by mouth as needed.     . Cholecalciferol (VITAMIN D3) 5000 UNITS TABS Take 1 tablet by mouth daily.     . Empagliflozin-Metformin HCl (SYNJARDY) 12.10-998 MG TABS Take 1 tablet by mouth 2 (two) times daily. 180 tablet 3  . Evolocumab (REPATHA SURECLICK) 626 MG/ML SOAJ Inject 140 mg into the skin every 14 (fourteen) days. (Patient taking  differently: Inject 140 mg into the skin every 30 (thirty) days. ) 6 mL 4  . ferrous sulfate 325 (65 FE) MG tablet Take 1 tablet (325 mg total) by mouth every other day.  3  . insulin aspart (NOVOLOG FLEXPEN) 100 UNIT/ML FlexPen Inject 16-20 Units into the skin 2 (two) times daily. (Patient taking differently: Inject 25 Units into the skin 2 (two) times daily. ) 45 mL 3  . Insulin Degludec (TRESIBA FLEXTOUCH) 200 UNIT/ML SOPN Inject 60-70 Units into the skin daily. 60 mL 3  . Insulin Pen Needle (NOVOTWIST) 32G X 5 MM MISC USE WITH INSULIN PENS UP TO FIVE TIMES PER DAY 300 each 1  . Lancets (FREESTYLE) lancets Check BS BID and PRN. DX.E11.9 300 each 3  . Multiple Vitamin (MULTIVITAMIN) capsule Take 1 capsule by mouth daily after supper.     . olmesartan-hydrochlorothiazide (BENICAR HCT) 40-25 MG tablet Take 1 tablet by mouth daily. 90 tablet 3  . pantoprazole (PROTONIX) 40 MG tablet Take 1 tablet (40 mg total) by mouth 2 (two) times daily. 180 tablet 3  . Probiotic Product (ALIGN) 4 MG CAPS Take 1 capsule by mouth every morning.    . rosuvastatin (CRESTOR) 10 MG tablet Take 1 tablet (10 mg total) by mouth every 3 (three) days. 90 tablet 3  . glucose  blood (FREESTYLE LITE) test strip USE TO CHECK BLOOD SUGAR TWICE A DAY AND AS NEEDED 300 each 3   No current facility-administered medications for this visit.     Past Medical History:  Diagnosis Date  . Arthritis   . CAD (coronary artery disease)    Nonobstructive Cath 2004  . 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 Cataract   . Diabetes mellitus   . Dysrhythmia    history of heart palpitations   . GERD (gastroesophageal reflux disease)   . Heart palpitations    last episode- 09/2014 seen by Dr Percival Spanish 5/16- clearance for surgery   . History of frequent urinary tract infections   . History of hiatal hernia   . Hyperlipemia   . Hypertension   . Obesity   . Pancreatitis     Past Surgical History:  Procedure  Laterality Date  . CARDIAC CATHETERIZATION     2004 and 1999- done by Dr Percival Spanish   . COLONOSCOPY  11/2003   diverticulosis, no polyps  . EYE SURGERY Bilateral    cateracts - dr Katy Fitch  . HYDROCELE EXCISION / REPAIR    . left calf surgery      related to trauma of being torn   . left index finger arthrioscopic surgery     . lymph node removed from right neck     . right shoulder surgery    . TOTAL HIP ARTHROPLASTY Right 03/16/2015   Procedure: RIGHT TOTAL HIP ARTHROPLASTY ANTERIOR APPROACH;  Surgeon: Paralee Cancel, MD;  Location: [facility];  Service: Orthopedics;  Laterality: Right;  . TOTAL HIP ARTHROPLASTY Left 06/22/2015   Procedure: LEFT TOTAL HIP ARTHROPLASTY ANTERIOR APPROACH;  Surgeon: Paralee Cancel, MD;  Location: [facility];  Service: Orthopedics;  Laterality: Left;  . UPPER GASTROINTESTINAL ENDOSCOPY  08/19/2010   hiatal hernia 2cm, mild gastritis    ROS:  Review of Systems  All other systems reviewed and are negative.   PHYSICAL EXAM BP 112/70   Pulse 92   Ht 5\' 8"  (1.727 m)   Wt 226 lb (102.5 kg)   BMI 34.36 kg/m   GENERAL:  Well appearing NECK:  No jugular venous distention, waveform within normal limits, carotid upstroke brisk and symmetric, no bruits, no thyromegaly LUNGS:  Clear to auscultation bilaterally CHEST:  Unremarkable HEART:  PMI not displaced or sustained,S1 and S2 within normal limits, no S3, no S4, no clicks, no rubs, no murmurs ABD:  Flat, positive bowel sounds normal in frequency in pitch, no bruits, no rebound, no guarding, no midline pulsatile mass, no hepatomegaly, no splenomegaly EXT:  2 plus pulses throughout, no edema, no cyanosis no clubbing     Lab Results  Component Value Date   CHOL 148 02/07/2017   TRIG 161 (H) 02/07/2017   HDL 43 02/07/2017   LDLCALC 73 02/07/2017   LDLDIRECT 39 11/02/2016    ASSESSMENT AND PLAN  CAD -  At the last visit he had dyspnea and a POET (Plain Old Exercise Treadmill) that was negative for ischemia.  His  symptoms are more consistent with deconditioning and he and I had a long discussion about this. At this point he's going to start on an exercise regimen and if he gets more dyspnea over time rather than less as he increases his fitness and I would consider further testing. We also talked about the importance of risk reduction as below.   HYPERLIPIDEMIA -  The lipids  are excellent as above.  He will continue the meds as listed.     HYPERTENSION -  The blood pressure is at target. No change in medications is indicated. We will continue with therapeutic lifestyle changes (TLC).;  DM -  His A1C was 8.2.  He knows that this is not at target and worse than previous.  He will follow with Dr. Laurance Flatten and he knows the importance of good BS control.

## 2017-03-05 ENCOUNTER — Encounter: Payer: Self-pay | Admitting: Cardiology

## 2017-03-05 ENCOUNTER — Ambulatory Visit (INDEPENDENT_AMBULATORY_CARE_PROVIDER_SITE_OTHER): Payer: Medicare Other | Admitting: Cardiology

## 2017-03-05 VITALS — BP 112/70 | HR 92 | Ht 68.0 in | Wt 226.0 lb

## 2017-03-05 DIAGNOSIS — I25119 Atherosclerotic heart disease of native coronary artery with unspecified angina pectoris: Secondary | ICD-10-CM | POA: Diagnosis not present

## 2017-03-05 DIAGNOSIS — E785 Hyperlipidemia, unspecified: Secondary | ICD-10-CM | POA: Diagnosis not present

## 2017-03-05 DIAGNOSIS — I2510 Atherosclerotic heart disease of native coronary artery without angina pectoris: Secondary | ICD-10-CM | POA: Diagnosis not present

## 2017-03-05 DIAGNOSIS — I10 Essential (primary) hypertension: Secondary | ICD-10-CM

## 2017-03-05 NOTE — Patient Instructions (Signed)
Your physician wants you to follow-up in: 6 months with Dr. Hochrein.  You will receive a reminder letter in the mail two months in advance. If you don't receive a letter, please call our office to schedule the follow-up appointment.  

## 2017-03-06 ENCOUNTER — Encounter: Payer: Self-pay | Admitting: Cardiology

## 2017-04-05 ENCOUNTER — Ambulatory Visit (INDEPENDENT_AMBULATORY_CARE_PROVIDER_SITE_OTHER): Payer: Medicare Other | Admitting: *Deleted

## 2017-04-05 DIAGNOSIS — Z23 Encounter for immunization: Secondary | ICD-10-CM | POA: Diagnosis not present

## 2017-04-05 NOTE — Progress Notes (Signed)
Flu vaccine given and patient tolerated well.  

## 2017-05-03 ENCOUNTER — Ambulatory Visit (INDEPENDENT_AMBULATORY_CARE_PROVIDER_SITE_OTHER): Payer: Medicare Other | Admitting: Ophthalmology

## 2017-05-03 DIAGNOSIS — H353111 Nonexudative age-related macular degeneration, right eye, early dry stage: Secondary | ICD-10-CM | POA: Diagnosis not present

## 2017-05-03 DIAGNOSIS — H43813 Vitreous degeneration, bilateral: Secondary | ICD-10-CM | POA: Diagnosis not present

## 2017-05-03 DIAGNOSIS — H35033 Hypertensive retinopathy, bilateral: Secondary | ICD-10-CM | POA: Diagnosis not present

## 2017-05-03 DIAGNOSIS — I10 Essential (primary) hypertension: Secondary | ICD-10-CM | POA: Diagnosis not present

## 2017-05-03 DIAGNOSIS — E113391 Type 2 diabetes mellitus with moderate nonproliferative diabetic retinopathy without macular edema, right eye: Secondary | ICD-10-CM | POA: Diagnosis not present

## 2017-05-03 DIAGNOSIS — E11319 Type 2 diabetes mellitus with unspecified diabetic retinopathy without macular edema: Secondary | ICD-10-CM | POA: Diagnosis not present

## 2017-05-03 DIAGNOSIS — E113292 Type 2 diabetes mellitus with mild nonproliferative diabetic retinopathy without macular edema, left eye: Secondary | ICD-10-CM | POA: Diagnosis not present

## 2017-05-03 LAB — HM DIABETES EYE EXAM

## 2017-05-08 ENCOUNTER — Ambulatory Visit: Payer: Self-pay | Admitting: Pharmacist

## 2017-05-08 ENCOUNTER — Encounter: Payer: Self-pay | Admitting: *Deleted

## 2017-05-08 ENCOUNTER — Ambulatory Visit (INDEPENDENT_AMBULATORY_CARE_PROVIDER_SITE_OTHER): Payer: Medicare Other | Admitting: *Deleted

## 2017-05-08 VITALS — BP 141/78 | HR 80 | Ht 68.0 in | Wt 228.0 lb

## 2017-05-08 DIAGNOSIS — Z0000 Encounter for general adult medical examination without abnormal findings: Secondary | ICD-10-CM | POA: Diagnosis not present

## 2017-05-08 NOTE — Progress Notes (Signed)
Subjective:   Marvin Frost is a 71 y.o. male who presents for a subsequent Medicare Annual Wellness Visit. Mr Devera was in the [employer] and is a retired Surveyor, quantity. 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 He is married and has 1 adult child.   Review of Systems  Reports that health is the same as last year.   Musculoskeletal: left knee pain  Cardiac Risk Factors include: advanced age (>87men, >55 women);hypertension;diabetes mellitus;family history of premature cardiovascular disease;dyslipidemia;male gender;obesity (BMI >30kg/m2)  Other systems negative today.    Objective:    Today's Vitals   05/08/17 0908  BP: (!) 141/78  Pulse: 80  Weight: 228 lb (103.4 kg)  Height: 5\' 8"  (1.727 m)   Body mass index is 34.67 kg/m.  Current Medications (verified) Outpatient Encounter Medications as of 05/08/2017  Medication Sig  . aspirin 81 MG tablet Take 81 mg by mouth daily.  . Blood Glucose Monitoring Suppl (FREESTYLE LITE) DEVI Use to check BS bid. DX. E11.9  . cetirizine (ZYRTEC) 10 MG tablet Take 10 mg by mouth as needed.   . Cholecalciferol (VITAMIN D3) 5000 UNITS TABS Take 1 tablet by mouth daily.   . Empagliflozin-Metformin HCl (SYNJARDY) 12.10-998 MG TABS Take 1 tablet by mouth 2 (two) times daily.  . Evolocumab (REPATHA SURECLICK) 403 MG/ML SOAJ Inject 140 mg into the skin every 14 (fourteen) days. (Patient taking differently: Inject 140 mg into the skin every 30 (thirty) days. )  . ferrous sulfate 325 (65 FE) MG tablet Take 1 tablet (325 mg total) by mouth every other day.  Marland Kitchen glucose blood (FREESTYLE LITE) test strip USE TO CHECK BLOOD SUGAR TWICE A DAY AND AS NEEDED  . insulin aspart (NOVOLOG FLEXPEN) 100 UNIT/ML FlexPen Inject 16-20 Units into the skin 2 (two) times daily. (Patient taking differently: Inject 25 Units into the skin 2 (two) times daily. )  . Insulin Degludec (TRESIBA FLEXTOUCH) 200 UNIT/ML  SOPN Inject 60-70 Units into the skin daily.  . Insulin Pen Needle (NOVOTWIST) 32G X 5 MM MISC USE WITH INSULIN PENS UP TO FIVE TIMES PER DAY  . Lancets (FREESTYLE) lancets Check BS BID and PRN. DX.E11.9  . Multiple Vitamin (MULTIVITAMIN) capsule Take 1 capsule by mouth daily after supper.   . olmesartan-hydrochlorothiazide (BENICAR HCT) 40-25 MG tablet Take 1 tablet by mouth daily.  . pantoprazole (PROTONIX) 40 MG tablet Take 1 tablet (40 mg total) by mouth 2 (two) times daily.  . rosuvastatin (CRESTOR) 10 MG tablet Take 1 tablet (10 mg total) by mouth every 3 (three) days.  . Probiotic Product (ALIGN) 4 MG CAPS Take 1 capsule by mouth every morning.   No facility-administered encounter medications on file as of 05/08/2017.     Allergies (verified) Liraglutide; Baxinets [cetylpyridinium-benzocaine]; Celebrex [celecoxib]; Clarithromycin; Toprol xl [metoprolol succinate]; Triplex ad; Welchol [colesevelam hcl]; Norvasc [amlodipine besylate]; and Statins   History: Past Medical History:  Diagnosis Date  . Arthritis   . CAD (coronary artery disease)    Nonobstructive Cath 2004  . 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 Cataract   . Diabetes mellitus   . Dysrhythmia    history of heart palpitations   . GERD (gastroesophageal reflux disease)   . Heart palpitations    last episode- 09/2014 seen by Dr Percival Spanish 5/16- clearance for surgery   . History of frequent urinary tract  infections   . History of hiatal hernia   . Hyperlipemia   . Hypertension   . Obesity   . Pancreatitis    Past Surgical History:  Procedure Laterality Date  . CARDIAC CATHETERIZATION     2004 and 1999- done by Dr Percival Spanish   . COLONOSCOPY  11/2003   diverticulosis, no polyps  . EYE SURGERY Bilateral    cateracts - dr Katy Fitch  . HYDROCELE EXCISION / REPAIR    . left calf surgery      related to trauma of being torn   . left index finger arthrioscopic surgery     . lymph node removed  from right neck     . right shoulder surgery    . UPPER GASTROINTESTINAL ENDOSCOPY  08/19/2010   hiatal hernia 2cm, mild gastritis   Family History  Problem Relation Age of Onset  . Heart disease Mother   . Diabetes Brother   . Heart disease Brother   . Heart attack Brother 62  . Heart attack Father 55  . GI problems Sister        torn esophagus during endoscopy  . Healthy Daughter    Social History   Occupational History  . Occupation: Best boy: [employer]    Comment: retired  Tobacco Use  . Smoking status: Former Smoker    Packs/day: 1.00    Years: 12.00    Pack years: 12.00    Types: Cigarettes    Last attempt to quit: 02/05/1984    Years since quitting: 33.2  . Smokeless tobacco: Former Systems developer    Types: [place] date: 06/26/1978  Substance and Sexual Activity  . Alcohol use: No  . Drug use: No  . Sexual activity: Yes   Tobacco Counseling No tobacco use  Activities of Daily Living In your present state of health, do you have any difficulty performing the following activities: 05/08/2017  Hearing? N  Vision? N  Comment cataract surgery last year and recent eye exam. Vision is 20/20  Difficulty concentrating or making decisions? N  Walking or climbing stairs? Y  Comment some left knee pain  Dressing or bathing? N  Doing errands, shopping? N  Preparing Food and eating ? N  Using the Toilet? N  In the past six months, have you accidently leaked urine? N  Do you have problems with loss of bowel control? N  Managing your Medications? N  Comment organizes them in tray  Managing your Finances? N  Housekeeping or managing your Housekeeping? N  Some recent data might be hidden    Immunizations and Health Maintenance Immunization History  Administered Date(s) Administered  . Influenza Whole 02/24/2010  . Influenza, High Dose Seasonal PF 04/15/2015, 04/12/2016, 04/05/2017  . Influenza,inj,Quad PF,6+ Mos 03/26/2013, 03/30/2014  . Pneumococcal  Conjugate-13 06/17/2013  . Pneumococcal Polysaccharide-23 02/24/2009  . Td 02/24/2006  . Zoster 07/27/2006   Health Maintenance Due  Topic Date Due  . TETANUS/TDAP  02/25/2016  . OPHTHALMOLOGY EXAM  04/27/2017    Patient Care Team: Chipper Herb, MD as PCP - General (Family Medicine) Hayden Pedro, MD as Consulting Physician (Ophthalmology) Paralee Cancel, MD as Consulting Physician (Orthopedic Surgery) Minus Breeding, MD as Consulting Physician (Cardiology)  No hospitalizations, ER visits, or surgeries this past year.     Assessment:   This is a routine wellness examination for [place].   Hearing/Vision screen No deficits noted during visit. Eye exam is current.   Dietary  issues and exercise activities discussed: Current Exercise Habits: Structured exercise class, Type of exercise: strength training/weights;walking, Time (Minutes): 30, Frequency (Times/Week): 5, Weekly Exercise (Minutes/Week): 150, Intensity: Moderate, Exercise limited by: orthopedic condition(s)(knee pain)  Goals Continue to stay physically active and aim for at least 150 min of moderate activity a week.    Depression Screen PHQ 2/9 Scores 05/08/2017 02/07/2017 11/02/2016 10/09/2016  PHQ - 2 Score 0 0 0 0  PHQ- 9 Score - - - -    Fall Risk Fall Risk  05/08/2017 02/07/2017 11/02/2016 10/09/2016 08/04/2016  Falls in the past year? No No No No No    Cognitive Function: MMSE - Mini Mental State Exam 05/08/2017 11/12/2015 11/05/2014  Orientation to time 5 5 5   Orientation to Place 5 5 5   Registration 3 3 3   Attention/ Calculation 5 5 5   Recall 3 3 3   Language- name 2 objects 2 2 2   Language- repeat 1 1 1   Language- follow 3 step command 3 3 3   Language- read & follow direction 1 1 1   Write a sentence 1 1 1   Copy design 1 1 1   Total score 30 30 30         Screening Tests Health Maintenance  Topic Date Due  . TETANUS/TDAP  02/25/2016  . OPHTHALMOLOGY EXAM  04/27/2017  . HEMOGLOBIN A1C  08/10/2017    . FOOT EXAM  02/07/2018  . INFLUENZA VACCINE  Completed  . Hepatitis C Screening  Completed  . PNA vac Low Risk Adult  Completed        Plan:  Call [employer] and see if they cover Boostrix and what your cost will be.  Keep f/u with PCP I will request a copy of your eye exam from Dr Zigmund Daniel Continue to stay physically active for at least 150 min a week  I have personally reviewed and noted the following in the patient's chart:   . Medical and social history . Use of alcohol, tobacco or illicit drugs  . Current medications and supplements . Functional ability and status . Nutritional status . Physical activity . Advanced directives . List of other physicians . Hospitalizations, surgeries, and ER visits in previous 12 months . Vitals . Screenings to include cognitive, depression, and falls . Referrals and appointments  In addition, I have reviewed and discussed with patient certain preventive protocols, quality metrics, and best practice recommendations. A written personalized care plan for preventive services as well as general preventive health recommendations were provided to patient.     Chong Sicilian, RN   05/08/2017   I have reviewed and agree with the above AWV documentation.   Mary-Margaret Hassell Done, FNP

## 2017-05-08 NOTE — Patient Instructions (Signed)
  Marvin Frost , Thank you for taking time to come for your Medicare Wellness Visit. I appreciate your ongoing commitment to your health goals. Please review the following plan we discussed and let me know if I can assist you in the future.   These are the goals we discussed: Continue to exercise and stay active.   This is a list of the screening recommended for you and due dates:  Health Maintenance  Topic Date Due  . Tetanus Vaccine  02/25/2016  . Eye exam for diabetics  04/27/2017  . Hemoglobin A1C  08/10/2017  . Complete foot exam   02/07/2018  . Flu Shot  Completed  .  Hepatitis C: One time screening is recommended by Center for Disease Control  (CDC) for  adults born from [birth year] through 1967.   Completed  . Pneumonia vaccines  Completed   Check with your insurance about the cost of Boostrix (tetanus shot)

## 2017-06-12 ENCOUNTER — Other Ambulatory Visit: Payer: Medicare Other

## 2017-06-12 DIAGNOSIS — N400 Enlarged prostate without lower urinary tract symptoms: Secondary | ICD-10-CM

## 2017-06-12 DIAGNOSIS — I10 Essential (primary) hypertension: Secondary | ICD-10-CM

## 2017-06-12 DIAGNOSIS — E1159 Type 2 diabetes mellitus with other circulatory complications: Secondary | ICD-10-CM

## 2017-06-12 DIAGNOSIS — E785 Hyperlipidemia, unspecified: Secondary | ICD-10-CM

## 2017-06-12 DIAGNOSIS — R7989 Other specified abnormal findings of blood chemistry: Secondary | ICD-10-CM

## 2017-06-12 DIAGNOSIS — K219 Gastro-esophageal reflux disease without esophagitis: Secondary | ICD-10-CM

## 2017-06-12 DIAGNOSIS — E559 Vitamin D deficiency, unspecified: Secondary | ICD-10-CM | POA: Diagnosis not present

## 2017-06-12 DIAGNOSIS — Z8679 Personal history of other diseases of the circulatory system: Secondary | ICD-10-CM

## 2017-06-12 DIAGNOSIS — E1169 Type 2 diabetes mellitus with other specified complication: Secondary | ICD-10-CM | POA: Diagnosis not present

## 2017-06-12 LAB — BAYER DCA HB A1C WAIVED: HB A1C (BAYER DCA - WAIVED): 7.5 % — ABNORMAL HIGH (ref <7.0)

## 2017-06-13 ENCOUNTER — Encounter: Payer: Self-pay | Admitting: Family Medicine

## 2017-06-13 ENCOUNTER — Ambulatory Visit (INDEPENDENT_AMBULATORY_CARE_PROVIDER_SITE_OTHER): Payer: Medicare Other | Admitting: Family Medicine

## 2017-06-13 VITALS — BP 122/68 | HR 91 | Temp 97.0°F | Ht 68.0 in | Wt 231.0 lb

## 2017-06-13 DIAGNOSIS — E1169 Type 2 diabetes mellitus with other specified complication: Secondary | ICD-10-CM | POA: Diagnosis not present

## 2017-06-13 DIAGNOSIS — I25119 Atherosclerotic heart disease of native coronary artery with unspecified angina pectoris: Secondary | ICD-10-CM | POA: Diagnosis not present

## 2017-06-13 DIAGNOSIS — R7989 Other specified abnormal findings of blood chemistry: Secondary | ICD-10-CM | POA: Diagnosis not present

## 2017-06-13 DIAGNOSIS — E1159 Type 2 diabetes mellitus with other circulatory complications: Secondary | ICD-10-CM | POA: Diagnosis not present

## 2017-06-13 DIAGNOSIS — Z6834 Body mass index (BMI) 34.0-34.9, adult: Secondary | ICD-10-CM | POA: Diagnosis not present

## 2017-06-13 DIAGNOSIS — Z96642 Presence of left artificial hip joint: Secondary | ICD-10-CM

## 2017-06-13 DIAGNOSIS — Z8679 Personal history of other diseases of the circulatory system: Secondary | ICD-10-CM | POA: Diagnosis not present

## 2017-06-13 DIAGNOSIS — M170 Bilateral primary osteoarthritis of knee: Secondary | ICD-10-CM

## 2017-06-13 DIAGNOSIS — K219 Gastro-esophageal reflux disease without esophagitis: Secondary | ICD-10-CM | POA: Diagnosis not present

## 2017-06-13 DIAGNOSIS — E785 Hyperlipidemia, unspecified: Secondary | ICD-10-CM

## 2017-06-13 LAB — BMP8+EGFR
BUN/Creatinine Ratio: 16 (ref 10–24)
BUN: 15 mg/dL (ref 8–27)
CHLORIDE: 103 mmol/L (ref 96–106)
CO2: 20 mmol/L (ref 20–29)
Calcium: 8.6 mg/dL (ref 8.6–10.2)
Creatinine, Ser: 0.92 mg/dL (ref 0.76–1.27)
GFR calc Af Amer: 95 mL/min/{1.73_m2} (ref >59)
GFR, EST NON AFRICAN AMERICAN: 82 mL/min/{1.73_m2} (ref >59)
GLUCOSE: 176 mg/dL — AB (ref 65–99)
POTASSIUM: 4.2 mmol/L (ref 3.5–5.2)
SODIUM: 142 mmol/L (ref 134–144)

## 2017-06-13 LAB — HEPATIC FUNCTION PANEL
ALBUMIN: 4.1 g/dL (ref 3.5–4.8)
ALT: 17 IU/L (ref 0–44)
AST: 16 IU/L (ref 0–40)
Alkaline Phosphatase: 58 IU/L (ref 39–117)
BILIRUBIN TOTAL: 0.2 mg/dL (ref 0.0–1.2)
BILIRUBIN, DIRECT: 0.09 mg/dL (ref 0.00–0.40)
Total Protein: 6.4 g/dL (ref 6.0–8.5)

## 2017-06-13 LAB — CBC WITH DIFFERENTIAL/PLATELET
BASOS: 2 %
Basophils Absolute: 0.2 10*3/uL (ref 0.0–0.2)
EOS (ABSOLUTE): 0.7 10*3/uL — ABNORMAL HIGH (ref 0.0–0.4)
EOS: 7 %
HEMATOCRIT: 35 % — AB (ref 37.5–51.0)
HEMOGLOBIN: 9.8 g/dL — AB (ref 13.0–17.7)
Immature Grans (Abs): 0 10*3/uL (ref 0.0–0.1)
Immature Granulocytes: 0 %
LYMPHS ABS: 1.7 10*3/uL (ref 0.7–3.1)
Lymphs: 18 %
MCH: 18.7 pg — ABNORMAL LOW (ref 26.6–33.0)
MCHC: 28 g/dL — AB (ref 31.5–35.7)
MCV: 67 fL — AB (ref 79–97)
MONOCYTES: 6 %
Monocytes Absolute: 0.6 10*3/uL (ref 0.1–0.9)
Neutrophils Absolute: 6.1 10*3/uL (ref 1.4–7.0)
Neutrophils: 67 %
Platelets: 432 10*3/uL — ABNORMAL HIGH (ref 150–379)
RBC: 5.23 x10E6/uL (ref 4.14–5.80)
RDW: 19.9 % — ABNORMAL HIGH (ref 12.3–15.4)
WBC: 9.2 10*3/uL (ref 3.4–10.8)

## 2017-06-13 LAB — VITAMIN D 25 HYDROXY (VIT D DEFICIENCY, FRACTURES): Vit D, 25-Hydroxy: 51.1 ng/mL (ref 30.0–100.0)

## 2017-06-13 LAB — LIPID PANEL
CHOL/HDL RATIO: 3.4 ratio (ref 0.0–5.0)
Cholesterol, Total: 155 mg/dL (ref 100–199)
HDL: 46 mg/dL (ref >39)
LDL Calculated: 66 mg/dL (ref 0–99)
TRIGLYCERIDES: 215 mg/dL — AB (ref 0–149)
VLDL Cholesterol Cal: 43 mg/dL — ABNORMAL HIGH (ref 5–40)

## 2017-06-13 MED ORDER — ICOSAPENT ETHYL 1 G PO CAPS: 2.0000 | ORAL_CAPSULE | Freq: Two times a day (BID) | ORAL | 0 refills | Status: DC

## 2017-06-13 NOTE — Progress Notes (Signed)
Subjective:    Patient ID: Marvin Frost, male    DOB: 22-Jul-1945, 71 y.o.   MRN: [id number]  HPI Pt here for follow up and management of chronic medical problems which includes diabetes and hyperlipidemia. He is taking medication regularly.  The patient today complains of right thigh and leg pain and he is seeing orthopedics about this.  He is also requesting a refill on all of his medicines.  His vital signs are stable.  His weight is up 3 pounds.  His body mass index is 34.68.  He has had lab work done and we will review this with him during the visit.  All liver function tests were good.  The CBC had a normal white blood cell count.  Hemoglobin though was lower than previously at 9.8 and he has had a low hemoglobin all along and we had asked for iron studies to be done previously and they were not so we will add these to this blood work since it is so close to having it drawn yesterday.  We will also ask him to bring back another FOBT.  His platelet count was slightly elevated.  The blood sugar was elevated at 176.  The creatinine and electrolytes were good.  The LDL C was good and at goal at 66 and he is on a PC SK 9 inhibitor plus Crestor.  Triglycerides were elevated at 215 and the HDL was good at 46.  The vitamin D level was good at 51.1 and the most recent hemoglobin A1c was 7.5 and this is slightly decreased from previously.  The patient denies any chest pain or shortness of breath anymore than usual.  He sees the cardiologist every 6 months.  This is Dr. Percival Spanish.  He had a stress test recently and this was good.  He denies any trouble with his stomach including nausea vomiting diarrhea blood in the stool.  He does have some constipation he thinks this came from the iron and this is why he diminished the iron intake recently and this could also account for the lower hemoglobin.  We have encouraged him to talk to the pharmacist about a different iron preparation either over-the-counter or by  prescription that might cause less constipation.  But he will have to go back on his iron regularly.  He denies any trouble passing his water.  He is complaining of some left hip pain and left knee pain and the left hip pain may very well be due to where he had a left hip replacement about a year ago.  Patient had to stop physical therapy because of the left hip pain.  He says that his blood sugars at home of been running between 160 and 170.     Patient Active Problem List   Diagnosis Date Noted  . 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 Primary osteoarthritis of both knees 01/26/2015  . Nonspecific (abnormal) findings on radiological and other examination of gastrointestinal tract 09/27/2010  . NAUSEA AND VOMITING 08/16/2010  . Type 2 diabetes mellitus not at goal [facility]) 03/18/2009  . HYPERLIPIDEMIA 03/18/2009  . OBESITY 03/18/2009  . Essential hypertension 03/18/2009  . Coronary atherosclerosis 03/18/2009  . [facility] 03/18/2009  . GERD 03/18/2009   Outpatient Encounter Medications as of 06/13/2017  Medication Sig  . aspirin 81 MG tablet Take 81 mg by mouth daily.  . Blood Glucose Monitoring Suppl (FREESTYLE LITE) DEVI Use to check BS bid. DX. E11.9  . cetirizine (ZYRTEC) 10 MG tablet Take  10 mg by mouth as needed.   . Cholecalciferol (VITAMIN D3) 5000 UNITS TABS Take 1 tablet by mouth daily.   . Empagliflozin-Metformin HCl (SYNJARDY) 12.10-998 MG TABS Take 1 tablet by mouth 2 (two) times daily.  . Evolocumab (REPATHA SURECLICK) 400 MG/ML SOAJ Inject 140 mg into the skin every 14 (fourteen) days. (Patient taking differently: Inject 140 mg into the skin every 30 (thirty) days. )  . ferrous sulfate 325 (65 FE) MG tablet Take 1 tablet (325 mg total) by mouth every other day.  Marland Kitchen glucose blood (FREESTYLE LITE) test strip USE TO CHECK BLOOD SUGAR TWICE A DAY AND AS NEEDED  . insulin aspart (NOVOLOG FLEXPEN) 100 UNIT/ML FlexPen Inject 16-20 Units into the skin 2 (two) times daily. (Patient taking  differently: Inject 25 Units into the skin 2 (two) times daily. )  . Insulin Degludec (TRESIBA FLEXTOUCH) 200 UNIT/ML SOPN Inject 60-70 Units into the skin daily.  . Insulin Pen Needle (NOVOTWIST) 32G X 5 MM MISC USE WITH INSULIN PENS UP TO FIVE TIMES PER DAY  . Lancets (FREESTYLE) lancets Check BS BID and PRN. DX.E11.9  . Multiple Vitamin (MULTIVITAMIN) capsule Take 1 capsule by mouth daily after supper.   . olmesartan-hydrochlorothiazide (BENICAR HCT) 40-25 MG tablet Take 1 tablet by mouth daily.  . pantoprazole (PROTONIX) 40 MG tablet Take 1 tablet (40 mg total) by mouth 2 (two) times daily.  . rosuvastatin (CRESTOR) 10 MG tablet Take 1 tablet (10 mg total) by mouth every 3 (three) days.  . [DISCONTINUED] Probiotic Product (ALIGN) 4 MG CAPS Take 1 capsule by mouth every morning.   No facility-administered encounter medications on file as of 06/13/2017.      Review of Systems  Constitutional: Negative.   HENT: Negative.   Eyes: Negative.   Respiratory: Negative.   Cardiovascular: Negative.   Gastrointestinal: Negative.   Endocrine: Negative.   Genitourinary: Negative.   Musculoskeletal: Positive for arthralgias (left thigh and leg pain --- has appt with ORTHO).  Skin: Negative.   Allergic/Immunologic: Negative.   Neurological: Negative.   Hematological: Negative.   Psychiatric/Behavioral: Negative.        Objective:   Physical Exam  Constitutional: He is oriented to person, place, and time. He appears well-developed and well-nourished. No distress.  The patient is pleasant and relaxed  HENT:  Head: Normocephalic and atraumatic.  Right Ear: External ear normal.  Left Ear: External ear normal.  Nose: Nose normal.  Mouth/Throat: Oropharynx is clear and moist. No oropharyngeal exudate.  Eyes: Conjunctivae and EOM are normal. Pupils are equal, round, and reactive to light. Right eye exhibits no discharge. Left eye exhibits no discharge. No scleral icterus.  Neck: Normal range  of motion. Neck supple. No thyromegaly present.  No bruits thyromegaly or anterior cervical adenopathy  Cardiovascular: Normal rate, regular rhythm, normal heart sounds and intact distal pulses.  No murmur heard. The heart is regular at 72/min  Pulmonary/Chest: Effort normal and breath sounds normal. No respiratory distress. He has no wheezes. He has no rales. He exhibits no tenderness.  No axillary adenopathy and chest is clear anteriorly and posteriorly  Abdominal: Soft. Bowel sounds are normal. He exhibits no mass. There is no tenderness. There is no rebound and no guarding.  Abdominal obesity without masses tenderness or organ enlargement bruits or inguinal adenopathy  Musculoskeletal: He exhibits tenderness. He exhibits no edema.  The patient is having some pain in and about the left hip with certain movements.  He does plan to see  the orthopedic surgeon soon to follow-up on this.  Lymphadenopathy:    He has no cervical adenopathy.  Neurological: He is alert and oriented to person, place, and time. He has normal reflexes. No cranial nerve deficit.  Skin: Skin is warm and dry. No rash noted.  Psychiatric: He has a normal mood and affect. His behavior is normal. Judgment and thought content normal.  Nursing note and vitals reviewed.  BP 122/68 (BP Location: Left Arm)   Pulse 91   Temp (!) 97 F (36.1 C) (Oral)   Ht 5\' 8"  (1.727 m)   Wt 231 lb (104.8 kg)   BMI 35.12 kg/m         Assessment & Plan:  1. Type 2 diabetes mellitus with other circulatory complications (HCC) -Continue with as aggressive therapeutic lifestyle changes and current diabetic treatment with all efforts to lose weight through diet and exercise  2. Hyperlipidemia associated with type 2 diabetes mellitus ([place]) -Continue with PC SK 9 inhibitor and Crestor and therapeutic lifestyle changes  3. History of ASCVD -10 you to follow-up with cardiology as planned  4. Low serum vitamin D - continue with vitamin D  replacement  5. Gastroesophageal reflux disease, esophagitis presence not specified -Patient will try ranitidine 3 days a week and proton pump inhibitors the other days a week to see if he can make a gradual switch over away from proton pump inhibitors.  He understands that if he has rebound reflux and heartburn that he will go back on his proton pump inhibitor  6. Status post left hip replacement -Follow-up with orthopedic surgeon because of increasing pain left hip  7. Gastroesophageal reflux disease without esophagitis -Continue with PPI and trying a H2 blocker  8. BMI 34.0-34.9,adult -Continue to make all efforts at weight loss through diet and exercise  9. Primary osteoarthritis of both knees -Follow-up with orthopedics as planned  No orders of the defined types were placed in this encounter.  Patient Instructions                       Medicare Annual Wellness Visit  [place] and the medical providers at [facility] strive to bring you the best medical care.  In doing so we not only want to address your current medical conditions and concerns but also to detect new conditions early and prevent illness, disease and health-related problems.    Medicare offers a yearly Wellness Visit which allows our clinical staff to assess your need for preventative services including immunizations, lifestyle education, counseling to decrease risk of preventable diseases and screening for fall risk and other medical concerns.    This visit is provided free of charge (no copay) for all Medicare recipients. The clinical pharmacists at [facility] have begun to conduct these Wellness Visits which will also include a thorough review of all your medications.    As you primary medical provider recommend that you make an appointment for your Annual Wellness Visit if you have not done so already this year.  You may set up this appointment before you leave today  or you may call back ([phone]) and schedule an appointment.  Please make sure when you call that you mention that you are scheduling your Annual Wellness Visit with the clinical pharmacist so that the appointment may be made for the proper length of time.     Continue current medications. Continue good therapeutic lifestyle changes which include good diet and  exercise. Fall precautions discussed with patient. If an FOBT was given today- please return it to our front desk. If you are over 25 years old - you may need Prevnar 16 or the adult Pneumonia vaccine.  **Flu shots are available--- please call and schedule a FLU-CLINIC appointment**  After your visit with [place] today you will receive a survey in the mail or online from [employer] regarding your care with [place]. Please take a moment to fill this out. Your feedback is very important to [place] as you can help [place] better understand your patient needs as well as improve your experience and satisfaction. WE CARE ABOUT YOU!!!  Follow-up with orthopedist as planned Discuss with pharmacist regarding iron preparation that would be less constipating and try to get back on iron daily versus every other day Return the FOBT card Stay as active as your hip will let you stay active and make all efforts to lose weight through diet and exercise   Arrie Senate MD

## 2017-06-13 NOTE — Patient Instructions (Addendum)
Medicare Annual Wellness Visit  [facility] and the medical providers at [facility] strive to bring you the best medical care.  In doing so we not only want to address your current medical conditions and concerns but also to detect new conditions early and prevent illness, disease and health-related problems.    Medicare offers a yearly Wellness Visit which allows our clinical staff to assess your need for preventative services including immunizations, lifestyle education, counseling to decrease risk of preventable diseases and screening for fall risk and other medical concerns.    This visit is provided free of charge (no copay) for all Medicare recipients. The clinical pharmacists at [facility] have begun to conduct these Wellness Visits which will also include a thorough review of all your medications.    As you primary medical provider recommend that you make an appointment for your Annual Wellness Visit if you have not done so already this year.  You may set up this appointment before you leave today or you may call back ([phone]) and schedule an appointment.  Please make sure when you call that you mention that you are scheduling your Annual Wellness Visit with the clinical pharmacist so that the appointment may be made for the proper length of time.     Continue current medications. Continue good therapeutic lifestyle changes which include good diet and exercise. Fall precautions discussed with patient. If an FOBT was given today- please return it to our front desk. If you are over 73 years old - you may need Prevnar 15 or the adult Pneumonia vaccine.  **Flu shots are available--- please call and schedule a FLU-CLINIC appointment**  After your visit with [place] today you will receive a survey in the mail or online from [employer] regarding your care with [place]. Please take a moment to fill this out. Your feedback is very  important to [place] as you can help [place] better understand your patient needs as well as improve your experience and satisfaction. WE CARE ABOUT YOU!!!  Follow-up with orthopedist as planned Discuss with pharmacist regarding iron preparation that would be less constipating and try to get back on iron daily versus every other day Return the FOBT card Stay as active as your hip will let you stay active and make all efforts to lose weight through diet and exercise

## 2017-06-14 ENCOUNTER — Other Ambulatory Visit: Payer: Self-pay

## 2017-06-14 DIAGNOSIS — R710 Precipitous drop in hematocrit: Secondary | ICD-10-CM

## 2017-06-14 LAB — IRON AND TIBC
IRON SATURATION: 4 % — AB (ref 15–55)
IRON: 14 ug/dL — AB (ref 38–169)
Total Iron Binding Capacity: 364 ug/dL (ref 250–450)
UIBC: 350 ug/dL — ABNORMAL HIGH (ref 111–343)

## 2017-06-14 LAB — VITAMIN B12: Vitamin B-12: 346 pg/mL (ref 232–1245)

## 2017-06-14 LAB — FERRITIN: FERRITIN: 6 ng/mL — AB (ref 30–400)

## 2017-06-14 LAB — SPECIMEN STATUS REPORT

## 2017-06-14 MED ORDER — INTEGRA PLUS PO CAPS: 1.0000 | ORAL_CAPSULE | Freq: Every day | ORAL | 1 refills | Status: DC

## 2017-06-22 ENCOUNTER — Other Ambulatory Visit: Payer: Medicare Other

## 2017-06-22 DIAGNOSIS — R710 Precipitous drop in hematocrit: Secondary | ICD-10-CM | POA: Diagnosis not present

## 2017-06-22 DIAGNOSIS — Z471 Aftercare following joint replacement surgery: Secondary | ICD-10-CM | POA: Diagnosis not present

## 2017-06-22 DIAGNOSIS — Z96642 Presence of left artificial hip joint: Secondary | ICD-10-CM | POA: Diagnosis not present

## 2017-06-25 ENCOUNTER — Other Ambulatory Visit: Payer: Self-pay | Admitting: Family Medicine

## 2017-06-25 DIAGNOSIS — Z8679 Personal history of other diseases of the circulatory system: Secondary | ICD-10-CM

## 2017-06-25 DIAGNOSIS — E782 Mixed hyperlipidemia: Principal | ICD-10-CM

## 2017-06-25 DIAGNOSIS — E1169 Type 2 diabetes mellitus with other specified complication: Secondary | ICD-10-CM

## 2017-06-26 LAB — FECAL OCCULT BLOOD, IMMUNOCHEMICAL: Fecal Occult Bld: POSITIVE — AB

## 2017-06-28 ENCOUNTER — Other Ambulatory Visit: Payer: Self-pay | Admitting: *Deleted

## 2017-06-28 DIAGNOSIS — D649 Anemia, unspecified: Secondary | ICD-10-CM

## 2017-06-28 DIAGNOSIS — R195 Other fecal abnormalities: Secondary | ICD-10-CM

## 2017-06-28 DIAGNOSIS — M79659 Pain in unspecified thigh: Secondary | ICD-10-CM | POA: Diagnosis not present

## 2017-06-28 DIAGNOSIS — M79652 Pain in left thigh: Secondary | ICD-10-CM | POA: Diagnosis not present

## 2017-07-03 ENCOUNTER — Telehealth: Payer: Self-pay | Admitting: Family Medicine

## 2017-07-03 NOTE — Telephone Encounter (Signed)
LMOVM that PA was received and Chong Sicilian will be working on this

## 2017-07-05 DIAGNOSIS — M25552 Pain in left hip: Secondary | ICD-10-CM | POA: Diagnosis not present

## 2017-07-05 DIAGNOSIS — Z96642 Presence of left artificial hip joint: Secondary | ICD-10-CM | POA: Diagnosis not present

## 2017-07-10 ENCOUNTER — Ambulatory Visit: Payer: Medicare Other | Attending: Orthopedic Surgery | Admitting: Physical Therapy

## 2017-07-10 DIAGNOSIS — M25552 Pain in left hip: Secondary | ICD-10-CM | POA: Diagnosis not present

## 2017-07-10 DIAGNOSIS — M6281 Muscle weakness (generalized): Secondary | ICD-10-CM | POA: Diagnosis not present

## 2017-07-10 NOTE — Therapy (Signed)
[facility] [place], [place], [address] Phone: [phone]   Fax:  [phone]  Physical Therapy Evaluation  Patient Details  Name: Marvin Frost MRN: [id number] Date of Birth: [birth year] Referring Provider: Paralee Cancel MD   Encounter Date: 07/10/2017  PT End of Session - 07/10/17 1505    Visit Number  1    Number of Visits  8    Date for PT Re-Evaluation  08/07/17    Authorization Type  62% limitation....FOTO.    PT Start Time  0147    PT Stop Time  0236    PT Time Calculation (min)  49 min    Activity Tolerance  Patient tolerated treatment well    Behavior During Therapy  WFL for tasks assessed/performed       Past Medical History:  Diagnosis Date  . Arthritis   . CAD (coronary artery disease)    Nonobstructive Cath 2004  . 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 Cataract   . Diabetes mellitus   . Dysrhythmia    history of heart palpitations   . GERD (gastroesophageal reflux disease)   . Heart palpitations    last episode- 09/2014 seen by Dr Percival Spanish 5/16- clearance for surgery   . History of frequent urinary tract infections   . History of hiatal hernia   . Hyperlipemia   . Hypertension   . Obesity   . Pancreatitis     Past Surgical History:  Procedure Laterality Date  . CARDIAC CATHETERIZATION     2004 and 1999- done by Dr Percival Spanish   . COLONOSCOPY  11/2003   diverticulosis, no polyps  . EYE SURGERY Bilateral    cateracts - dr Katy Fitch  . HYDROCELE EXCISION / REPAIR    . left calf surgery      related to trauma of being torn   . left index finger arthrioscopic surgery     . lymph node removed from right neck     . right shoulder surgery    . TOTAL HIP ARTHROPLASTY Right 03/16/2015   Procedure: RIGHT TOTAL HIP ARTHROPLASTY ANTERIOR APPROACH;  Surgeon: Paralee Cancel, MD;  Location: [facility];  Service: Orthopedics;  Laterality: Right;  . TOTAL HIP ARTHROPLASTY Left 06/22/2015    Procedure: LEFT TOTAL HIP ARTHROPLASTY ANTERIOR APPROACH;  Surgeon: Paralee Cancel, MD;  Location: [facility];  Service: Orthopedics;  Laterality: Left;  . UPPER GASTROINTESTINAL ENDOSCOPY  08/19/2010   hiatal hernia 2cm, mild gastritis    There were no vitals filed for this visit.   Subjective Assessment - 07/10/17 1600    Subjective  the patient presents with c/o left thigh pain.  He had a left total hip replacement on 06/22/15.  He states the problem began about 9 to 12 months ago.  He states after being up awhile his thigh begins to ache but upon sitting his pain reduces quite quickly.  His pain at rest today is a 3/10.  he has a h/o ocasional low back pain but states it has always been right sided.  X-rays and MRI's were negative.  He states he may been getting a bone scan.    Pertinent History  Right total hip replacement.  left knee arthritis.  H/o "on/off" low back pain.    How long can you walk comfortably?  Short community distances.    Patient Stated Goals  Walk and get around without pain.    Currently in  Pain?  Yes    Pain Score  3     Pain Location  Hip    Pain Orientation  Left    Pain Descriptors / Indicators  Aching    Pain Type  Chronic pain    Pain Onset  More than a month ago    Pain Frequency  Constant    Aggravating Factors   See above.    Pain Relieving Factors  See above.         [facility] PT Assessment - 07/10/17 0001      Assessment   Medical Diagnosis  Pain in left hip.    Referring Provider  Paralee Cancel MD    Onset Date/Surgical Date  -- 9-12 months.      Precautions   Precautions  Anterior Hip No ultrasound.      Restrictions   Weight Bearing Restrictions  Yes      Balance Screen   Has the patient fallen in the past 6 months  No    Has the patient had a decrease in activity level because of a fear of falling?   No    Is the patient reluctant to leave their home because of a fear of falling?   No      Home Environment   Living Environment  Private  residence      Prior Function   Level of Independence  Independent      Observation/Other Assessments   Observations  -- Left VMO atrophy.    Focus on Therapeutic Outcomes (FOTO)   62% limitation.      Posture/Postural Control   Posture/Postural Control  No significant limitations      ROM / Strength   AROM / PROM / Strength  AROM;Strength      AROM   Overall AROM Comments  Left hip flexion to 85 degrees in supine; full right knee range of motion       Strength   Overall Strength Comments  Left hip flexion and abduction= 4+/5; left knee strength= 5/5.      Palpation   Palpation comment  Very tender to palpation over mid-portion on left vastus lateralis which was very taut to palpation with an active trigger point.  Also tender to a lesser degree over the mid-portion of the left ITB.      Special Tests   Other special tests  Bilateral LE DTR's are normal.      Ambulation/Gait   Gait Comments  Gait pattern generally unremarkable today.             Objective measurements completed on examination: See above findings.      [facility] Adult PT Treatment/Exercise - 07/10/17 0001      Modalities   Modalities  Electrical Stimulation;Moist Heat      Moist Heat Therapy   Number Minutes Moist Heat  15 Minutes    Moist Heat Location  -- Left quads.      Acupuncturist Location  -- Left mid-lateral quads.    Electrical Stimulation Action  -- Pre-mod.    Electrical Stimulation Parameters  Constant at 80-150 Hz x 15 minutes.    Electrical Stimulation Goals  Tone;Pain               PT Short Term Goals - 07/10/17 1626      PT SHORT TERM GOAL #1   Title  STG's=LTG's.        PT Long Term Goals - 07/10/17  1626      PT LONG TERM GOAL #1   Title  Independent with a HEP.    Time  4    Period  Weeks    Status  New      PT LONG TERM GOAL #2   Title  be independent with advanced HEP    Time  4    Period  Weeks    Status  New       PT LONG TERM GOAL #3   Title  perform ADL's with pain not > 3/10    Time  4    Period  Weeks    Status  New      PT LONG TERM GOAL #4   Title  Walk a community distance with pain not > 2-3/10.    Time  4    Period  Weeks    Status  New             Plan - 07/10/17 1617    Clinical Impression Statement  The patient presents with left mid-thigh pain especially with standing.  His pain has gotten so bad at times that he has to use a cane for ambulation.  Most notable is a very tender trigger point in the left vastus lateralis.  He has some left hip weakness as well.  His pain limits his functional mobility at times.  The patient will benefit from skilled physical therapy to include manual STW/M and dry needling.    History and Personal Factors relevant to plan of care:  Bilateral anterior total hip replacements.    Clinical Presentation  Evolving    Clinical Presentation due to:  Not improving.    Clinical Decision Making  Moderate    Rehab Potential  Good    PT Frequency  2x / week    PT Duration  4 weeks    PT Treatment/Interventions  ADLs/Self Care Home Management;Cryotherapy;Electrical Stimulation;Therapeutic activities;Therapeutic exercise;Patient/family education;Manual techniques;Dry needling    PT Next Visit Plan  STW/M to left quads inclusing IASTM; dry needling.  Left hip strengthening. electrcial stimulation.       Patient will benefit from skilled therapeutic intervention in order to improve the following deficits and impairments:  Pain, Decreased activity tolerance, Decreased strength  Visit Diagnosis: Pain in left hip - Plan: PT plan of care cert/re-cert  Muscle weakness (generalized) - Plan: PT plan of care cert/re-cert     Problem List Patient Active Problem List   Diagnosis Date Noted  . 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 Primary osteoarthritis of both knees 01/26/2015  . Nonspecific (abnormal) findings on radiological and other examination of  gastrointestinal tract 09/27/2010  . NAUSEA AND VOMITING 08/16/2010  . Type 2 diabetes mellitus not at goal [facility]) 03/18/2009  . HYPERLIPIDEMIA 03/18/2009  . OBESITY 03/18/2009  . Essential hypertension 03/18/2009  . Coronary atherosclerosis 03/18/2009  . [facility] 03/18/2009  . GERD 03/18/2009    Stashia Sia, [place] MPT 07/10/2017, 4:31 PM  [facility] [address] [place], [place], [address] Phone: [phone]   Fax:  [phone]  Name: Marvin Frost MRN: [id number] Date of Birth: 1945-11-15

## 2017-07-12 ENCOUNTER — Ambulatory Visit: Payer: Medicare Other | Admitting: Physical Therapy

## 2017-07-12 ENCOUNTER — Encounter: Payer: Self-pay | Admitting: Physical Therapy

## 2017-07-12 DIAGNOSIS — M6281 Muscle weakness (generalized): Secondary | ICD-10-CM

## 2017-07-12 DIAGNOSIS — M25552 Pain in left hip: Secondary | ICD-10-CM

## 2017-07-12 NOTE — Therapy (Signed)
[facility] [place], [place], [address] Phone: [phone]   Fax:  [phone]  Physical Therapy Treatment  Patient Details  Name: Marvin Frost MRN: [id number] Date of Birth: 1946/03/14 Referring Provider: Paralee Cancel MD   Encounter Date: 07/12/2017  PT End of Session - 07/12/17 1635    Visit Number  2    Number of Visits  8    Date for PT Re-Evaluation  08/07/17    Authorization Type  62% limitation....FOTO.    PT Start Time  0317    PT Stop Time  0411    PT Time Calculation (min)  54 min       Past Medical History:  Diagnosis Date  . Arthritis   . CAD (coronary artery disease)    Nonobstructive Cath 2004  . 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 Cataract   . Diabetes mellitus   . Dysrhythmia    history of heart palpitations   . GERD (gastroesophageal reflux disease)   . Heart palpitations    last episode- 09/2014 seen by Dr Percival Spanish 5/16- clearance for surgery   . History of frequent urinary tract infections   . History of hiatal hernia   . Hyperlipemia   . Hypertension   . Obesity   . Pancreatitis     Past Surgical History:  Procedure Laterality Date  . CARDIAC CATHETERIZATION     2004 and 1999- done by Dr Percival Spanish   . COLONOSCOPY  11/2003   diverticulosis, no polyps  . EYE SURGERY Bilateral    cateracts - dr Katy Fitch  . HYDROCELE EXCISION / REPAIR    . left calf surgery      related to trauma of being torn   . left index finger arthrioscopic surgery     . lymph node removed from right neck     . right shoulder surgery    . TOTAL HIP ARTHROPLASTY Right 03/16/2015   Procedure: RIGHT TOTAL HIP ARTHROPLASTY ANTERIOR APPROACH;  Surgeon: Paralee Cancel, MD;  Location: [facility];  Service: Orthopedics;  Laterality: Right;  . TOTAL HIP ARTHROPLASTY Left 06/22/2015   Procedure: LEFT TOTAL HIP ARTHROPLASTY ANTERIOR APPROACH;  Surgeon: Paralee Cancel, MD;  Location: [facility];  Service:  Orthopedics;  Laterality: Left;  . UPPER GASTROINTESTINAL ENDOSCOPY  08/19/2010   hiatal hernia 2cm, mild gastritis    There were no vitals filed for this visit.  Subjective Assessment - 07/12/17 1635    Subjective  Little sore from that first treatment.    Pain Score  4     Pain Location  Leg    Pain Orientation  Left    Pain Descriptors / Indicators  Aching    Pain Onset  More than a month ago                      [facility] Adult PT Treatment/Exercise - 07/12/17 0001      Modalities   Modalities  Electrical Stimulation      Moist Heat Therapy   Number Minutes Moist Heat  20 Minutes    Moist Heat Location  -- Left thigh.      Acupuncturist Location  -- Left anterior/lateral thigh.    Electrical Stimulation Action  -- Pre-mod.    Electrical Stimulation Parameters  Constant at 80-150 Hz x 20 minutes.    Electrical Stimulation Goals  Tone;Pain  Manual Therapy   Manual Therapy  Soft tissue mobilization    Soft tissue mobilization  STW/M including IASTM and ischemic release to left thigh/vastus lateralis and ITB x 24 minutes.               PT Short Term Goals - 07/10/17 1626      PT SHORT TERM GOAL #1   Title  STG's=LTG's.        PT Long Term Goals - 07/10/17 1626      PT LONG TERM GOAL #1   Title  Independent with a HEP.    Time  4    Period  Weeks    Status  New      PT LONG TERM GOAL #2   Title  be independent with advanced HEP    Time  4    Period  Weeks    Status  New      PT LONG TERM GOAL #3   Title  perform ADL's with pain not > 3/10    Time  4    Period  Weeks    Status  New      PT LONG TERM GOAL #4   Title  Walk a community distance with pain not > 2-3/10.    Time  4    Period  Weeks    Status  New            Plan - 07/12/17 1638    Clinical Impression Statement  Patient did very well today.  TO in left vastus lateralis was quite tender to palpation today.    PT  Treatment/Interventions  ADLs/Self Care Home Management;Cryotherapy;Electrical Stimulation;Therapeutic activities;Therapeutic exercise;Patient/family education;Manual techniques;Dry needling    PT Next Visit Plan  STW/M to left quads inclusing IASTM; dry needling.  Left hip strengthening. electrcial stimulation.       Patient will benefit from skilled therapeutic intervention in order to improve the following deficits and impairments:     Visit Diagnosis: Pain in left hip  Muscle weakness (generalized)     Problem List Patient Active Problem List   Diagnosis Date Noted  . 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 Primary osteoarthritis of both knees 01/26/2015  . Nonspecific (abnormal) findings on radiological and other examination of gastrointestinal tract 09/27/2010  . NAUSEA AND VOMITING 08/16/2010  . Type 2 diabetes mellitus not at goal [facility]) 03/18/2009  . HYPERLIPIDEMIA 03/18/2009  . OBESITY 03/18/2009  . Essential hypertension 03/18/2009  . Coronary atherosclerosis 03/18/2009  . [facility] 03/18/2009  . GERD 03/18/2009    Marvin Frost, [place] MPT 07/12/2017, 4:40 PM  [facility] [address] [place], [place], [address] Phone: [phone]   Fax:  [phone]  Name: Marvin Frost MRN: [id number] Date of Birth: 01/12/1946

## 2017-07-17 ENCOUNTER — Encounter: Payer: Self-pay | Admitting: Internal Medicine

## 2017-07-17 ENCOUNTER — Encounter: Payer: Self-pay | Admitting: Physical Therapy

## 2017-07-17 ENCOUNTER — Ambulatory Visit (INDEPENDENT_AMBULATORY_CARE_PROVIDER_SITE_OTHER): Payer: Medicare Other | Admitting: Internal Medicine

## 2017-07-17 ENCOUNTER — Ambulatory Visit: Payer: Medicare Other | Admitting: Physical Therapy

## 2017-07-17 VITALS — BP 126/62 | HR 70 | Ht 68.0 in | Wt 227.0 lb

## 2017-07-17 DIAGNOSIS — M25552 Pain in left hip: Secondary | ICD-10-CM | POA: Diagnosis not present

## 2017-07-17 DIAGNOSIS — D508 Other iron deficiency anemias: Secondary | ICD-10-CM

## 2017-07-17 DIAGNOSIS — M6281 Muscle weakness (generalized): Secondary | ICD-10-CM

## 2017-07-17 DIAGNOSIS — R195 Other fecal abnormalities: Secondary | ICD-10-CM

## 2017-07-17 NOTE — Progress Notes (Signed)
Marvin Frost 72 y.o. June 07, 1946 [id number]  Assessment & Plan:   Encounter Diagnoses  Name Primary?  . iron deficiency anemia Yes  . Heme + stool     This should be evaluated with an EGD and colonoscopy.  The Hemoccult is positive for colonic blood only but with his iron deficiency anemia he needs an EGD.  I have at least some suspicion this is related to perioperative blood loss from his hip replacements that was never completely repleted with iron.  However bleeding lesions of the GI tract are possible and should be looked for.The risks and benefits as well as alternatives of endoscopic procedure(s) have been discussed and reviewed. All questions answered. The patient agrees to proceed.   Keep in mind the possibility of celiac disease though unlikely he is on olmesartan which could cause a celiac-like illness and problem.  I do not think it would be related to his 4 weeks of diarrhea but bear in mind.  I appreciate the opportunity to care for this patient. CC: Chipper Herb, MD    Subjective:   Chief Complaint: Heme positive stool and iron deficiency anemia  HPI Marvin Frost is here with his wife today because of an iron deficiency anemia and heme positive stool.  I know him from previous procedures, he had a negative colonoscopy in 2015, he did have diverticulosis but no bleeding lesions.  The following year he had both hips replaced on separate occasions, and he was found to be anemic and iron deficient around that time.  Chart review shows that he had reduction in hemoglobin perioperatively, and estimated blood loss is of 350 and 300 cc respectively.  It looks like he had at least one transfusion though he does not remember that.  He was treated with iron therapy off and on but he admits he was not completely compliant with that.  He was off it for a while and then last year he was discovered to be iron deficient again persistently and told to take his ferrous sulfate.  In August he  had an I FOBT that was negative it was repeated recently and was positive.  He does not see any bleeding.  He did however have about 3-4 weeks of upset stomach and diarrhea in late 2018 that seems to have subsided.  There were no other sick contacts.  His GI review of systems is otherwise negative.  He does take aspirin but is on a PPI Allergies  Allergen Reactions  . Liraglutide Other (See Comments)    Abdominal pain and enlarged pancreas on CT ? pancreatitis  . Baxinets [Cetylpyridinium-Benzocaine]     unknown  . Celebrex [Celecoxib] Nausea Only  . Clarithromycin     unkown  . Toprol Xl [Metoprolol Succinate] Other (See Comments)    Unknown   . Triplex Ad Other (See Comments)    Abdominal pain and enlarged pancreas on CT? pancreatitis  . Welchol [Colesevelam Hcl] Other (See Comments)    constipation  . Norvasc [Amlodipine Besylate] Rash  . Statins Other (See Comments)    Unknown- muscle and joint pain if taken for long periods of time, takes Crestor every 2-3 days to avoid this reaction   Current Meds  Medication Sig  . aspirin 81 MG tablet Take 81 mg by mouth daily.  . Blood Glucose Monitoring Suppl (FREESTYLE LITE) DEVI Use to check BS bid. DX. E11.9  . cetirizine (ZYRTEC) 10 MG tablet Take 10 mg by mouth as needed.   Marland Kitchen  Cholecalciferol (VITAMIN D3) 5000 UNITS TABS Take 1 tablet by mouth daily.   . Empagliflozin-Metformin HCl (SYNJARDY) 12.10-998 MG TABS Take 1 tablet by mouth 2 (two) times daily.  Marland Kitchen FeFum-FePoly-FA-B Cmp-C-Biot (INTEGRA PLUS) CAPS Take 1 capsule by mouth daily.  . ferrous sulfate 325 (65 FE) MG tablet Take 1 tablet (325 mg total) by mouth every other day.  Marland Kitchen glucose blood (FREESTYLE LITE) test strip USE TO CHECK BLOOD SUGAR TWICE A DAY AND AS NEEDED  . Icosapent Ethyl (VASCEPA) 1 g CAPS Take 2 capsules by mouth 2 (two) times daily.  . insulin aspart (NOVOLOG FLEXPEN) 100 UNIT/ML FlexPen Inject 16-20 Units into the skin 2 (two) times daily. (Patient taking  differently: Inject 25 Units into the skin 2 (two) times daily. )  . Insulin Degludec (TRESIBA FLEXTOUCH) 200 UNIT/ML SOPN Inject 60-70 Units into the skin daily.  . Insulin Pen Needle (NOVOTWIST) 32G X 5 MM MISC USE WITH INSULIN PENS UP TO FIVE TIMES PER DAY  . Lancets (FREESTYLE) lancets Check BS BID and PRN. DX.E11.9  . Multiple Vitamin (MULTIVITAMIN) capsule Take 1 capsule by mouth daily after supper.   . olmesartan-hydrochlorothiazide (BENICAR HCT) 40-25 MG tablet Take 1 tablet by mouth daily.  . pantoprazole (PROTONIX) 40 MG tablet Take 1 tablet (40 mg total) by mouth 2 (two) times daily.  Marland Kitchen REPATHA SURECLICK 846 MG/ML SOAJ INJECT 140 MG UNDER THE SKIN EVERY 14 DAYS  . rosuvastatin (CRESTOR) 10 MG tablet Take 1 tablet (10 mg total) by mouth every 3 (three) days.   Past Medical History:  Diagnosis Date  . Arthritis   . CAD (coronary artery disease)    Nonobstructive Cath 2004  . 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 Cataract   . Diabetes mellitus   . Dysrhythmia    history of heart palpitations   . GERD (gastroesophageal reflux disease)   . Heart palpitations    last episode- 09/2014 seen by Dr Percival Spanish 5/16- clearance for surgery   . History of frequent urinary tract infections   . History of hiatal hernia   . Hyperlipemia   . Hypertension   . Obesity   . Pancreatitis    Past Surgical History:  Procedure Laterality Date  . CARDIAC CATHETERIZATION     2004 and 1999- done by Dr Percival Spanish   . COLONOSCOPY  11/2003   diverticulosis, no polyps  . EYE SURGERY Bilateral    cateracts - dr Katy Fitch  . HYDROCELE EXCISION / REPAIR    . left calf surgery      related to trauma of being torn   . left index finger arthrioscopic surgery     . lymph node removed from right neck     . right shoulder surgery    . TOTAL HIP ARTHROPLASTY Right 03/16/2015   Procedure: RIGHT TOTAL HIP ARTHROPLASTY ANTERIOR APPROACH;  Surgeon: Paralee Cancel, MD;  [facility]: [facility];   Service: Orthopedics;  Laterality: Right;  . TOTAL HIP ARTHROPLASTY Left 06/22/2015   Procedure: LEFT TOTAL HIP ARTHROPLASTY ANTERIOR APPROACH;  Surgeon: Paralee Cancel, MD;  Location: [facility];  Service: Orthopedics;  Laterality: Left;  . UPPER GASTROINTESTINAL ENDOSCOPY  08/19/2010   hiatal hernia 2cm, mild gastritis   Social History   Social History Narrative   Married   Retired from [employer]   1 child   Former but no current tobacco   No EtOH/drugs   family history includes Diabetes in  his brother; GI problems in his sister; Healthy in his daughter; Heart attack (age of onset: 64) in his father; Heart attack (age of onset: 81) in his brother; Heart disease in his brother and mother.   Review of Systems Some left hip pain - getting PT  Objective:   Physical Exam @BP  126/62   Pulse 70   Ht 5\' 8"  (1.727 m)   Wt 227 lb (103 kg)   BMI 34.52 kg/m @  General:  NAD Eyes:   anicteric Lungs:  clear Heart::  S1S2 no rubs, murmurs or gallops Abdomen:  soft and nontender, BS+ Ext:   no edema, cyanosis or clubbing    Data Reviewed:   As per HPI  Lab Results  Component Value Date   FERRITIN 6 (L) 06/12/2017   Lab Results  Component Value Date   WBC 9.2 06/12/2017   HGB 9.8 (L) 06/12/2017   HCT 35.0 (L) 06/12/2017   MCV 67 (L) 06/12/2017   PLT 432 (H) 06/12/2017

## 2017-07-17 NOTE — Patient Instructions (Addendum)
  You have been scheduled for an endoscopy and colonoscopy. Please follow the written instructions given to you at your visit today. Please pick up your prep supplies at the pharmacy. If you use inhalers (even only as needed), please bring them with you on the day of your procedure.   I appreciate the opportunity to care for you. Silvano Rusk, MD, [facility]  ______Add this to your prep instuctions please:  _  _x   INSULIN (LONG ACTING) MEDICATION INSTRUCTIONS [facility] Aas)   The day before your procedure:  Take  your regular evening dose    The day of your procedure:  Do not take your morning dose

## 2017-07-17 NOTE — Patient Instructions (Signed)
[facility]  DRY NEEDLING CONSENT FORM   Trigger point dry needling is a physical therapy approach to treat Myofascial Pain and Dysfunction.  Dry Needling (DN) is a valuable and effective way to deactivate myofascial trigger points (muscle knots/pain). It is skilled intervention that uses a thin filiform needle to penetrate the skin and stimulate underlying myofascial trigger points, muscular, and connective tissues for the management of neuromusculoskeletal pain and movement impairments.  A local twitch response (LTR) will be elicited.  This can sometimes feel like a deep ache in the muscle during the procedure. Multiple trigger points in multiple muscles can be treated during each treatment.  No medication of any kind is injected.   As with any medical treatment and procedure, there are possible adverse events.  While significant adverse events are uncommon, they do sometimes occur and must be considered prior to giving consent.  1. Dry needling often causes a "post needling soreness".  There can be an increase in pain from a couple of hours to 2-3 days, followed by an improvement in the overall pain state. 2. Any time a needle is used there is a risk of infection.  However, we are using new, sterile, and disposable needles; infections are extremely rare. 3. There is a possibility that you may bleed or bruise.  You may feel tired and some nausea following treatment. 4. There is a rare possibility of a pneumothorax (air in the chest cavity). 5. Allergic reaction to nickel in the stainless steel needle. 6. If a nerve is touched, it may cause paresthesia (a prickling/shock sensation) which is usually brief, but may continue for a couple of days.  Following treatment stay hydrated.  Continue regular activities but not too vigorous initially after treatment for 24-48 hours.  Dry Needling is best when combined with other physical therapy interventions such as  strengthening, stretching and other therapeutic modalities.   PLEASE ANSWER THE FOLLOWING QUESTIONS:  Do you have a lack of sensation?   Y/N  Do you have a phobia or fear of needles  Y/N  Are you pregnant?    Y/N If yes:  How many weeks? __________ Do you have any implanted devices?  Y/N If yes:  Pacemaker/Spinal Cord Stimulator/Deep Brain Stimulator/Insulin Pump/Other: ________________ Do you have any implants?  Y/N If yes: Breast/Facial/Pecs/Buttocks/Calves/Hip  Replacement/ Knee Replacement/Other: _________ Do you take any blood thinners?   Y/N If yes: Coumadin (Warfarin)/Other: ___________________ Do you have a bleeding disorder?   Y/N If yes: What kind: _________________________________ Do you take any immunosuppressants?  Y/N If yes:   What kind: _________________________________ Do you take anti-inflammatories?   Y/N If yes: What kind: Advil/Aspirin/Other: ________________ Have you ever been diagnosed with Scoliosis? Y/N Have you had back surgery?   Y/N If yes:  Laminectomy/Fusion/Other: ___________________   I have read, or had read to me, the above.  I have had the opportunity to ask any questions.  All of my questions have been answered to my satisfaction and I understand the risks involved with dry needling.  I consent to examination and treatment at [facility], including dry needling, of any and all of my involved and affected muscles.  

## 2017-07-17 NOTE — Therapy (Signed)
[facility] [place], [place], [address] Phone: [phone]   Fax:  [phone]  Physical Therapy Treatment  Patient Details  Name: Marvin Frost MRN: [id number] Date of Birth: 1945-07-04 Referring Provider: Paralee Cancel MD   Encounter Date: 07/17/2017  PT End of Session - 07/17/17 1731    Visit Number  3    Date for PT Re-Evaluation  08/07/17    Authorization Type  62% limitation....FOTO.    PT Start Time  0400    PT Stop Time  0508    PT Time Calculation (min)  68 min    Activity Tolerance  Patient tolerated treatment well    Behavior During Therapy  WFL for tasks assessed/performed       Past Medical History:  Diagnosis Date  . Arthritis   . CAD (coronary artery disease)    Nonobstructive Cath 2004  . 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 Cataract   . Diabetes mellitus   . Dysrhythmia    history of heart palpitations   . GERD (gastroesophageal reflux disease)   . Heart palpitations    last episode- 09/2014 seen by Dr Percival Spanish 5/16- clearance for surgery   . History of frequent urinary tract infections   . History of hiatal hernia   . Hyperlipemia   . Hypertension   . Obesity   . Pancreatitis     Past Surgical History:  Procedure Laterality Date  . CARDIAC CATHETERIZATION     2004 and 1999- done by Dr Percival Spanish   . COLONOSCOPY  11/2003   diverticulosis, no polyps  . EYE SURGERY Bilateral    cateracts - dr Katy Fitch  . HYDROCELE EXCISION / REPAIR    . left calf surgery      related to trauma of being torn   . left index finger arthrioscopic surgery     . lymph node removed from right neck     . right shoulder surgery    . TOTAL HIP ARTHROPLASTY Right 03/16/2015   Procedure: RIGHT TOTAL HIP ARTHROPLASTY ANTERIOR APPROACH;  Surgeon: Paralee Cancel, MD;  Location: [facility];  Service: Orthopedics;  Laterality: Right;  . TOTAL HIP ARTHROPLASTY Left 06/22/2015   Procedure: LEFT TOTAL HIP  ARTHROPLASTY ANTERIOR APPROACH;  Surgeon: Paralee Cancel, MD;  Location: [facility];  Service: Orthopedics;  Laterality: Left;  . UPPER GASTROINTESTINAL ENDOSCOPY  08/19/2010   hiatal hernia 2cm, mild gastritis    There were no vitals filed for this visit.  Subjective Assessment - 07/17/17 1732    Subjective  Some soreness but doing okay.    Patient Stated Goals  Walk and get around without pain.    Pain Score  4     Pain Location  Leg    Pain Orientation  Left    Pain Descriptors / Indicators  Aching    Pain Type  Chronic pain    Pain Onset  More than a month ago                      [facility] Adult PT Treatment/Exercise - 07/17/17 0001      Exercises   Exercises  Knee/Hip      Knee/Hip Exercises: Aerobic   Nustep  Level 4 x 8 minutes.      Modalities   Modalities  Electrical Stimulation;Moist Heat      Moist Heat Therapy   Number Minutes Moist Heat  20 Minutes    Moist Heat Location  -- Left thigh.      Acupuncturist Location  -- Left thigh.    Electrical Stimulation Action  -- Pre-mod.    Electrical Stimulation Parameters  Constant at 80-150 Hz x 20 minutes.    Electrical Stimulation Goals  Tone;Pain      Manual Therapy   Manual Therapy  Soft tissue mobilization    Soft tissue mobilization  STW/M with focus on left lateral quadriceps including IASTM x 25 minutes.               PT Short Term Goals - 07/10/17 1626      PT SHORT TERM GOAL #1   Title  STG's=LTG's.        PT Long Term Goals - 07/10/17 1626      PT LONG TERM GOAL #1   Title  Independent with a HEP.    Time  4    Period  Weeks    Status  New      PT LONG TERM GOAL #2   Title  be independent with advanced HEP    Time  4    Period  Weeks    Status  New      PT LONG TERM GOAL #3   Title  perform ADL's with pain not > 3/10    Time  4    Period  Weeks    Status  New      PT LONG TERM GOAL #4   Title  Walk a community distance with pain not  > 2-3/10.    Time  4    Period  Weeks    Status  New            Plan - 07/17/17 1736    Clinical Impression Statement  Patient did very well with treatment today.  Left vastus lateralis trigger point was less palpably tender today.    PT Treatment/Interventions  ADLs/Self Care Home Management;Cryotherapy;Electrical Stimulation;Therapeutic activities;Therapeutic exercise;Patient/family education;Manual techniques;Dry needling    PT Next Visit Plan  STW/M to left quads inclusing IASTM; dry needling.  Left hip strengthening. electrcial stimulation.       Patient will benefit from skilled therapeutic intervention in order to improve the following deficits and impairments:  Pain, Decreased activity tolerance, Decreased strength  Visit Diagnosis: Pain in left hip  Muscle weakness (generalized)     Problem List Patient Active Problem List   Diagnosis Date Noted  . 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 Primary osteoarthritis of both knees 01/26/2015  . Nonspecific (abnormal) findings on radiological and other examination of gastrointestinal tract 09/27/2010  . NAUSEA AND VOMITING 08/16/2010  . Type 2 diabetes mellitus not at goal [facility]) 03/18/2009  . HYPERLIPIDEMIA 03/18/2009  . OBESITY 03/18/2009  . Essential hypertension 03/18/2009  . Coronary atherosclerosis 03/18/2009  . [facility] 03/18/2009  . GERD 03/18/2009    Sinclaire Artiga, [place] MPT 07/17/2017, 5:38 PM  [facility] [address] [place], [place], [address] Phone: [phone]   Fax:  [phone]  Name: Marvin Frost MRN: [id number] Date of Birth: 1946/02/19

## 2017-07-19 ENCOUNTER — Encounter: Payer: Self-pay | Admitting: Physical Therapy

## 2017-07-19 ENCOUNTER — Ambulatory Visit: Payer: Medicare Other | Admitting: Physical Therapy

## 2017-07-19 ENCOUNTER — Telehealth: Payer: Self-pay | Admitting: Family Medicine

## 2017-07-19 DIAGNOSIS — M25552 Pain in left hip: Secondary | ICD-10-CM

## 2017-07-19 DIAGNOSIS — M6281 Muscle weakness (generalized): Secondary | ICD-10-CM

## 2017-07-19 NOTE — Telephone Encounter (Addendum)
Patient aware that I will send message to Cyril Mourning to review for repatha. I do not see that patient had a transfusion. Only form I see is transfusion history form asking if he had ever had a transfusion.

## 2017-07-19 NOTE — Therapy (Signed)
[facility] [place], [place], [address] Phone: [phone]   Fax:  [phone]  Physical Therapy Treatment  Patient Details  Name: Marvin Frost MRN: [id number] Date of Birth: [birth year] Referring Provider: Paralee Cancel MD   Encounter Date: 07/19/2017  PT End of Session - 07/19/17 1535    Visit Number  4    Number of Visits  8    Date for PT Re-Evaluation  08/07/17    Authorization Type  62% limitation....FOTO.    PT Start Time  0145    PT Stop Time  0232    PT Time Calculation (min)  47 min    Activity Tolerance  Patient tolerated treatment well    Behavior During Therapy  WFL for tasks assessed/performed       Past Medical History:  Diagnosis Date  . Arthritis   . CAD (coronary artery disease)    Nonobstructive Cath 2004  . 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 Cataract   . Diabetes mellitus   . Dysrhythmia    history of heart palpitations   . GERD (gastroesophageal reflux disease)   . Heart palpitations    last episode- 09/2014 seen by Dr Percival Spanish 5/16- clearance for surgery   . History of frequent urinary tract infections   . History of hiatal hernia   . Hyperlipemia   . Hypertension   . Obesity   . Pancreatitis     Past Surgical History:  Procedure Laterality Date  . CARDIAC CATHETERIZATION     2004 and 1999- done by Dr Percival Spanish   . COLONOSCOPY  11/2003   diverticulosis, no polyps  . EYE SURGERY Bilateral    cateracts - dr Katy Fitch  . HYDROCELE EXCISION / REPAIR    . left calf surgery      related to trauma of being torn   . left index finger arthrioscopic surgery     . lymph node removed from right neck     . right shoulder surgery    . TOTAL HIP ARTHROPLASTY Right 03/16/2015   Procedure: RIGHT TOTAL HIP ARTHROPLASTY ANTERIOR APPROACH;  Surgeon: Paralee Cancel, MD;  Location: [facility];  Service: Orthopedics;  Laterality: Right;  . TOTAL HIP ARTHROPLASTY Left 06/22/2015   Procedure: LEFT TOTAL HIP ARTHROPLASTY ANTERIOR APPROACH;  Surgeon: Paralee Cancel, MD;  Location: [facility];  Service: Orthopedics;  Laterality: Left;  . UPPER GASTROINTESTINAL ENDOSCOPY  08/19/2010   hiatal hernia 2cm, mild gastritis    There were no vitals filed for this visit.  Subjective Assessment - 07/19/17 1536    Subjective  These treatments are really starting to make a difference.    Pain Score  3     Pain Location  Leg    Pain Orientation  Left    Pain Descriptors / Indicators  Aching    Pain Onset  More than a month ago       TREATMENT:  HMP and constant pre-mod at 80-150 Hz x 10 minutes f/b IASTM and trigger point release to affected left quadriceps x 24 minutes.  Excellent response to treatments thus far.                         PT Short Term Goals - 07/10/17 1626      PT SHORT TERM GOAL #1   Title  STG's=LTG's.        PT Long Term  Goals - 07/10/17 1626      PT LONG TERM GOAL #1   Title  Independent with a HEP.    Time  4    Period  Weeks    Status  New      PT LONG TERM GOAL #2   Title  be independent with advanced HEP    Time  4    Period  Weeks    Status  New      PT LONG TERM GOAL #3   Title  perform ADL's with pain not > 3/10    Time  4    Period  Weeks    Status  New      PT LONG TERM GOAL #4   Title  Walk a community distance with pain not > 2-3/10.    Time  4    Period  Weeks    Status  New              Patient will benefit from skilled therapeutic intervention in order to improve the following deficits and impairments:     Visit Diagnosis: Pain in left hip  Muscle weakness (generalized)     Problem List Patient Active Problem List   Diagnosis Date Noted  . 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 Primary osteoarthritis of both knees 01/26/2015  . Nonspecific (abnormal) findings on radiological and other examination of gastrointestinal tract 09/27/2010  . NAUSEA AND VOMITING 08/16/2010  . Type 2 diabetes  mellitus not at goal [facility]) 03/18/2009  . HYPERLIPIDEMIA 03/18/2009  . OBESITY 03/18/2009  . Essential hypertension 03/18/2009  . Coronary atherosclerosis 03/18/2009  . [facility] 03/18/2009  . GERD 03/18/2009    Lashanda Storlie, [place] MPT 07/19/2017, 3:40 PM  [facility] [address] [place], [place], [address] Phone: [phone]   Fax:  [phone]  Name: Yousof Alderman MRN: [id number] Date of Birth: 09/27/1945

## 2017-07-24 ENCOUNTER — Ambulatory Visit: Payer: Medicare Other | Admitting: Physical Therapy

## 2017-07-24 ENCOUNTER — Encounter: Payer: Self-pay | Admitting: Physical Therapy

## 2017-07-24 DIAGNOSIS — M6281 Muscle weakness (generalized): Secondary | ICD-10-CM

## 2017-07-24 DIAGNOSIS — M25552 Pain in left hip: Secondary | ICD-10-CM

## 2017-07-24 NOTE — Therapy (Signed)
[facility] [place], [place], [address] Phone: [phone]   Fax:  [phone]  Physical Therapy Treatment  Patient Details  Name: Marvin Frost MRN: [id number] Date of Birth: [birth year] Referring Provider: Paralee Cancel MD   Encounter Date: 07/24/2017  PT End of Session - 07/24/17 1505    Visit Number  5    Number of Visits  8    Date for PT Re-Evaluation  08/07/17    Authorization Type  62% limitation....FOTO.    PT Start Time  1115    PT Stop Time  1207    PT Time Calculation (min)  52 min    Activity Tolerance  Patient tolerated treatment well    Behavior During Therapy  WFL for tasks assessed/performed       Past Medical History:  Diagnosis Date  . Arthritis   . CAD (coronary artery disease)    Nonobstructive Cath 2004  . 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 Cataract   . Diabetes mellitus   . Dysrhythmia    history of heart palpitations   . GERD (gastroesophageal reflux disease)   . Heart palpitations    last episode- 09/2014 seen by Dr Percival Spanish 5/16- clearance for surgery   . History of frequent urinary tract infections   . History of hiatal hernia   . Hyperlipemia   . Hypertension   . Obesity   . Pancreatitis     Past Surgical History:  Procedure Laterality Date  . CARDIAC CATHETERIZATION     2004 and 1999- done by Dr Percival Spanish   . COLONOSCOPY  11/2003   diverticulosis, no polyps  . EYE SURGERY Bilateral    cateracts - dr Katy Fitch  . HYDROCELE EXCISION / REPAIR    . left calf surgery      related to trauma of being torn   . left index finger arthrioscopic surgery     . lymph node removed from right neck     . right shoulder surgery    . TOTAL HIP ARTHROPLASTY Right 03/16/2015   Procedure: RIGHT TOTAL HIP ARTHROPLASTY ANTERIOR APPROACH;  Surgeon: Paralee Cancel, MD;  Location: [facility];  Service: Orthopedics;  Laterality: Right;  . TOTAL HIP ARTHROPLASTY Left 06/22/2015   Procedure: LEFT TOTAL HIP ARTHROPLASTY ANTERIOR APPROACH;  Surgeon: Paralee Cancel, MD;  Location: [facility];  Service: Orthopedics;  Laterality: Left;  . UPPER GASTROINTESTINAL ENDOSCOPY  08/19/2010   hiatal hernia 2cm, mild gastritis    There were no vitals filed for this visit.  Subjective Assessment - 07/24/17 1505    Subjective  I have a sore spot on my leg but I'm much better.    Pain Score  2     Pain Location  Leg    Pain Orientation  Left    Pain Descriptors / Indicators  Aching;Dull;Discomfort    Pain Type  Chronic pain    Pain Onset  More than a month ago                      [facility] Adult PT Treatment/Exercise - 07/24/17 0001      Exercises   Exercises  --      Modalities   Modalities  Electrical Stimulation;Moist Heat      Moist Heat Therapy   Number Minutes Moist Heat  20 Minutes    Moist Heat Location  -- Left thigh.  Acupuncturist Location  -- Left affected thigh.    Electrical Stimulation Action  -- Pre-mod    Electrical Stimulation Parameters  Constant at 80-150 Hz x 20 minutes.    Electrical Stimulation Goals  Tone;Pain      Manual Therapy   Manual Therapy  Soft tissue mobilization    Soft tissue mobilization  STW/M including IASTM including ischemic release to TP in left vastus lateralis x 24 minutes.               PT Short Term Goals - 07/10/17 1626      PT SHORT TERM GOAL #1   Title  STG's=LTG's.        PT Long Term Goals - 07/10/17 1626      PT LONG TERM GOAL #1   Title  Independent with a HEP.    Time  4    Period  Weeks    Status  New      PT LONG TERM GOAL #2   Title  be independent with advanced HEP    Time  4    Period  Weeks    Status  New      PT LONG TERM GOAL #3   Title  perform ADL's with pain not > 3/10    Time  4    Period  Weeks    Status  New      PT LONG TERM GOAL #4   Title  Walk a community distance with pain not > 2-3/10.    Time  4    Period  Weeks     Status  New            Plan - 07/24/17 1518    Clinical Impression Statement  Very good reduction in left lateral quadriceps trigger point.  It is still present but smaller in size and not as palpably tender.    PT Treatment/Interventions  ADLs/Self Care Home Management;Cryotherapy;Electrical Stimulation;Therapeutic activities;Therapeutic exercise;Patient/family education;Manual techniques;Dry needling    PT Next Visit Plan  STW/M to left quads inclusing IASTM; dry needling.  Left hip strengthening. electrcial stimulation.       Patient will benefit from skilled therapeutic intervention in order to improve the following deficits and impairments:  Pain, Decreased activity tolerance, Decreased strength  Visit Diagnosis: Pain in left hip  Muscle weakness (generalized)     Problem List Patient Active Problem List   Diagnosis Date Noted  . 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 Primary osteoarthritis of both knees 01/26/2015  . Nonspecific (abnormal) findings on radiological and other examination of gastrointestinal tract 09/27/2010  . NAUSEA AND VOMITING 08/16/2010  . Type 2 diabetes mellitus not at goal [facility]) 03/18/2009  . HYPERLIPIDEMIA 03/18/2009  . OBESITY 03/18/2009  . Essential hypertension 03/18/2009  . Coronary atherosclerosis 03/18/2009  . [facility] 03/18/2009  . GERD 03/18/2009    Zakari Couchman, [place]  MPT 07/24/2017, 3:21 PM  [facility] [address] [place], [place], [address] Phone: [phone]   Fax:  [phone]  Name: Marvin Frost MRN: [id number] Date of Birth: 20-Nov-1945

## 2017-07-26 ENCOUNTER — Encounter: Payer: Self-pay | Admitting: Physical Therapy

## 2017-07-26 ENCOUNTER — Ambulatory Visit: Payer: Medicare Other | Admitting: Physical Therapy

## 2017-07-26 DIAGNOSIS — M25552 Pain in left hip: Secondary | ICD-10-CM

## 2017-07-26 DIAGNOSIS — M6281 Muscle weakness (generalized): Secondary | ICD-10-CM

## 2017-07-26 NOTE — Therapy (Addendum)
[facility] [place], [place], [address] Phone: [phone]   Fax:  [phone]  Physical Therapy Treatment  Patient Details  Name: Marvin Frost MRN: [id number] Date of Birth: 02/12/1946 Referring Provider: Paralee Cancel MD   Encounter Date: 07/26/2017  PT End of Session - 07/26/17 1418    Visit Number  6    Number of Visits  8    Date for PT Re-Evaluation  08/07/17    Authorization Type  62% limitation....FOTO at evaluation, FOTO on 07/26/17 = 51% limitation    PT Start Time  1115    PT Stop Time  1205    PT Time Calculation (min)  50 min    Activity Tolerance  Patient tolerated treatment well    Behavior During Therapy  WFL for tasks assessed/performed       Past Medical History:  Diagnosis Date  . Arthritis   . CAD (coronary artery disease)    Nonobstructive Cath 2004  . 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 Cataract   . Diabetes mellitus   . Dysrhythmia    history of heart palpitations   . GERD (gastroesophageal reflux disease)   . Heart palpitations    last episode- 09/2014 seen by Dr Percival Spanish 5/16- clearance for surgery   . History of frequent urinary tract infections   . History of hiatal hernia   . Hyperlipemia   . Hypertension   . Obesity   . Pancreatitis     Past Surgical History:  Procedure Laterality Date  . CARDIAC CATHETERIZATION     2004 and 1999- done by Dr Percival Spanish   . COLONOSCOPY  11/2003   diverticulosis, no polyps  . EYE SURGERY Bilateral    cateracts - dr Katy Fitch  . HYDROCELE EXCISION / REPAIR    . left calf surgery      related to trauma of being torn   . left index finger arthrioscopic surgery     . lymph node removed from right neck     . right shoulder surgery    . TOTAL HIP ARTHROPLASTY Right 03/16/2015   Procedure: RIGHT TOTAL HIP ARTHROPLASTY ANTERIOR APPROACH;  Surgeon: Paralee Cancel, MD;  Location: [facility];  Service: Orthopedics;  Laterality: Right;   . TOTAL HIP ARTHROPLASTY Left 06/22/2015   Procedure: LEFT TOTAL HIP ARTHROPLASTY ANTERIOR APPROACH;  Surgeon: Paralee Cancel, MD;  Location: [facility];  Service: Orthopedics;  Laterality: Left;  . UPPER GASTROINTESTINAL ENDOSCOPY  08/19/2010   hiatal hernia 2cm, mild gastritis    There were no vitals filed for this visit.  Subjective Assessment - 07/26/17 1416    Subjective  Pt reporting feeling much better. Pt still reporting soreness in Left hip    Pertinent History  Right total hip replacement.  left knee arthritis.  H/o "on/off" low back pain.    How long can you walk comfortably?  Short community distances.    Patient Stated Goals  Walk and get around without pain.    Currently in Pain?  Yes    Pain Score  2     Pain Location  Leg    Pain Orientation  Left    Pain Descriptors / Indicators  Aching    Pain Type  Chronic pain    Pain Onset  More than a month ago    Pain Frequency  Constant  PT Education - 07/26/17 1417    Education provided  Yes    Education Details  SLR, and supine clams shells for HEP    Person(s) Educated  Patient    Methods  Explanation;Demonstration;Verbal cues    Comprehension  Verbalized understanding;Returned demonstration       PT Short Term Goals - 07/10/17 1626      PT SHORT TERM GOAL #1   Title  STG's=LTG's.        PT Long Term Goals - 07/26/17 1423      PT LONG TERM GOAL #1   Title  Independent with a HEP.    Status  Achieved      PT LONG TERM GOAL #2   Title  be independent with advanced HEP    Status  On-going      PT LONG TERM GOAL #3   Title  perform ADL's with pain not > 3/10    Status  On-going      PT LONG TERM GOAL #4   Title  Walk a community distance with pain not > 2-3/10.    Status  Achieved            Plan - 07/26/17 1419    Clinical Impression Statement  Pt has made progress since beginning therpay with improvements in his FOTO score from 62% limitation to  51% limitation. Pt reporting less pain in his Left upper thigh/hip. Pt still with tightness and mild trigger points noted along vastus lateralis and IT band. Pt reporting he has a follow up visit with Dr. Alvan Dame and will continue therapy if physician advises.     Rehab Potential  Good    PT Frequency  2x / week    PT Treatment/Interventions  ADLs/Self Care Home Management;Cryotherapy;Electrical Stimulation;Therapeutic activities;Therapeutic exercise;Patient/family education;Manual techniques;Dry needling    PT Next Visit Plan  STW/M to left quads inclusing IASTM; dry needling.  Left hip strengthening. electrcial stimulation.    PT Home Exercise Plan  Pain free SLR and supine clam shells with green theraband    Consulted and Agree with Plan of Care  Patient       Patient will benefit from skilled therapeutic intervention in order to improve the following deficits and impairments:  Pain, Decreased activity tolerance, Decreased strength  Visit Diagnosis: Pain in left hip  Muscle weakness (generalized)     Problem List Patient Active Problem List   Diagnosis Date Noted  . 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 Primary osteoarthritis of both knees 01/26/2015  . Nonspecific (abnormal) findings on radiological and other examination of gastrointestinal tract 09/27/2010  . NAUSEA AND VOMITING 08/16/2010  . Type 2 diabetes mellitus not at goal [facility]) 03/18/2009  . HYPERLIPIDEMIA 03/18/2009  . OBESITY 03/18/2009  . Essential hypertension 03/18/2009  . Coronary atherosclerosis 03/18/2009  . [facility] 03/18/2009  . GERD 03/18/2009    Oretha Caprice,  MPT 07/26/2017, 2:25 PM  [facility] [address] [place], [place], [address] Phone: [phone]   Fax:  [phone]  Name: Marvin Frost MRN: [id number] Date of Birth: 1945-11-04  PHYSICAL THERAPY DISCHARGE SUMMARY  Visits from Start of Care: 6.  Current functional level related to goals / functional  outcomes: See above.   Remaining deficits: See below.   Education / Equipment: HEP. Plan: Patient agrees to discharge.  Patient goals were not met. Patient is being discharged due to not returning since the last visit.  ?????  Marvin Frost MPT  

## 2017-07-31 ENCOUNTER — Other Ambulatory Visit: Payer: Self-pay | Admitting: Family Medicine

## 2017-08-01 ENCOUNTER — Encounter: Payer: Self-pay | Admitting: Family Medicine

## 2017-08-01 DIAGNOSIS — E11319 Type 2 diabetes mellitus with unspecified diabetic retinopathy without macular edema: Secondary | ICD-10-CM | POA: Insufficient documentation

## 2017-08-06 DIAGNOSIS — M79652 Pain in left thigh: Secondary | ICD-10-CM | POA: Diagnosis not present

## 2017-08-06 DIAGNOSIS — Z96642 Presence of left artificial hip joint: Secondary | ICD-10-CM | POA: Diagnosis not present

## 2017-08-09 ENCOUNTER — Telehealth: Payer: Self-pay | Admitting: Family Medicine

## 2017-08-09 ENCOUNTER — Other Ambulatory Visit: Payer: Self-pay | Admitting: Family Medicine

## 2017-08-31 ENCOUNTER — Ambulatory Visit (INDEPENDENT_AMBULATORY_CARE_PROVIDER_SITE_OTHER): Payer: Medicare Other | Admitting: Nurse Practitioner

## 2017-08-31 ENCOUNTER — Encounter: Payer: Self-pay | Admitting: Nurse Practitioner

## 2017-08-31 VITALS — BP 145/77 | HR 88 | Temp 97.0°F | Ht 68.0 in | Wt 230.0 lb

## 2017-08-31 DIAGNOSIS — J069 Acute upper respiratory infection, unspecified: Secondary | ICD-10-CM | POA: Diagnosis not present

## 2017-08-31 MED ORDER — AZITHROMYCIN 250 MG PO TABS: ORAL_TABLET | ORAL | 0 refills | Status: DC

## 2017-08-31 NOTE — Patient Instructions (Signed)

## 2017-08-31 NOTE — Progress Notes (Signed)
   Subjective:    Patient ID: Marvin Frost, male    DOB: 29-Apr-1946, 72 y.o.   MRN: [id number]  HPI Patient comes in today c/o congestion, sore throat and cough. Started about 2 days ago.     Review of Systems  Constitutional: Positive for appetite change (decreased), chills and fever (? but has not checked it.).  HENT: Positive for congestion, postnasal drip, rhinorrhea and sore throat. Negative for sinus pressure, sinus pain and trouble swallowing.   Respiratory: Positive for cough (nonproductive).   Cardiovascular: Negative.   Gastrointestinal: Negative.   Musculoskeletal: Negative.   Neurological: Positive for headaches.  Psychiatric/Behavioral: Negative.   All other systems reviewed and are negative.      Objective:   Physical Exam  Constitutional: He is oriented to person, place, and time. He appears well-developed and well-nourished. Distressed: mild.  HENT:  Right Ear: Hearing, tympanic membrane, external ear and ear canal normal.  Left Ear: Hearing, tympanic membrane, external ear and ear canal normal.  Nose: Mucosal edema and rhinorrhea present. Right sinus exhibits no maxillary sinus tenderness and no frontal sinus tenderness. Left sinus exhibits no maxillary sinus tenderness and no frontal sinus tenderness.  Mouth/Throat: Uvula is midline and oropharynx is clear and moist.  Neck: Normal range of motion. Neck supple.  Cardiovascular: Normal rate and regular rhythm.  Pulmonary/Chest: Effort normal and breath sounds normal.  Dry cough   Musculoskeletal: Normal range of motion.  Lymphadenopathy:    He has no cervical adenopathy.  Neurological: He is alert and oriented to person, place, and time.  Skin: Skin is warm.  Psychiatric: He has a normal mood and affect. His behavior is normal. Judgment and thought content normal.   BP (!) 145/77   Pulse 88   Temp (!) 97 F (36.1 C) (Oral)   Ht 5\' 8"  (1.727 m)   Wt 230 lb (104.3 kg)   BMI 34.97 kg/m       Assessment &  Plan:   1. Upper respiratory infection with cough and congestion    Meds ordered this encounter  Medications  . azithromycin (ZITHROMAX Z-PAK) 250 MG tablet    Sig: As directed    Dispense:  6 tablet    Refill:  0    Order Specific Question:   Supervising Provider    Answer:   VINCENT, CAROL L [[id number]]   1. Take meds as prescribed 2. Use a cool mist humidifier especially during the winter months and when heat has been humid. 3. Use saline nose sprays frequently 4. Saline irrigations of the nose can be very helpful if done frequently.  * 4X daily for 1 week*  * Use of a nettie pot can be helpful with this. Follow directions with this* 5. Drink plenty of fluids 6. Keep thermostat turn down low 7.For any cough or congestion  Use plain Mucinex- regular strength or max strength is fine   * Children- consult with Pharmacist for dosing 8. For fever or aces or pains- take tylenol or ibuprofen appropriate for age and weight.  * for fevers greater than 101 orally you may alternate ibuprofen and tylenol every  3 hours.   Mary-Margaret Hassell Done, FNP

## 2017-09-03 ENCOUNTER — Telehealth: Payer: Self-pay | Admitting: Cardiology

## 2017-09-03 NOTE — Telephone Encounter (Signed)
Closed Encounter  °

## 2017-09-06 ENCOUNTER — Telehealth: Payer: Self-pay | Admitting: Internal Medicine

## 2017-09-06 NOTE — Telephone Encounter (Signed)
Patient advised that he can proceed with procedure on 3/21. He has a cough and finished his antibiotics today

## 2017-09-13 ENCOUNTER — Ambulatory Visit (AMBULATORY_SURGERY_CENTER): Payer: Medicare Other | Admitting: Internal Medicine

## 2017-09-13 ENCOUNTER — Encounter: Payer: Self-pay | Admitting: Internal Medicine

## 2017-09-13 ENCOUNTER — Other Ambulatory Visit: Payer: Self-pay

## 2017-09-13 VITALS — BP 116/59 | HR 74 | Temp 97.5°F | Resp 18 | Ht 68.0 in | Wt 227.0 lb

## 2017-09-13 DIAGNOSIS — R195 Other fecal abnormalities: Secondary | ICD-10-CM | POA: Diagnosis not present

## 2017-09-13 DIAGNOSIS — I2510 Atherosclerotic heart disease of native coronary artery without angina pectoris: Secondary | ICD-10-CM | POA: Diagnosis not present

## 2017-09-13 DIAGNOSIS — D508 Other iron deficiency anemias: Secondary | ICD-10-CM

## 2017-09-13 DIAGNOSIS — K317 Polyp of stomach and duodenum: Secondary | ICD-10-CM | POA: Diagnosis not present

## 2017-09-13 DIAGNOSIS — E119 Type 2 diabetes mellitus without complications: Secondary | ICD-10-CM | POA: Diagnosis not present

## 2017-09-13 DIAGNOSIS — D509 Iron deficiency anemia, unspecified: Secondary | ICD-10-CM | POA: Diagnosis not present

## 2017-09-13 MED ORDER — SODIUM CHLORIDE 0.9 % IV SOLN: 500.0000 mL | Freq: Once | INTRAVENOUS | Status: DC

## 2017-09-13 NOTE — Progress Notes (Signed)
Called to room to assist during endoscopic procedure.  Patient ID and intended procedure confirmed with present staff. Received instructions for my participation in the procedure from the performing physician.  

## 2017-09-13 NOTE — Op Note (Signed)
[place] Patient Name: Marvin Frost Procedure Date: 09/13/2017 9:19 AM MRN: [id number] Endoscopist: Gatha Mayer , MD Age: 72 Referring MD:  Date of Birth: 10-09-1945 Gender: Male Account #: [id number] Procedure:                Colonoscopy Indications:              Heme positive stool, Iron deficiency anemia Medicines:                Propofol per Anesthesia, Monitored Anesthesia Care Procedure:                Pre-Anesthesia Assessment:                           - Prior to the procedure, a History and Physical                            was performed, and patient medications and                            allergies were reviewed. The patient's tolerance of                            previous anesthesia was also reviewed. The risks                            and benefits of the procedure and the sedation                            options and risks were discussed with the patient.                            All questions were answered, and informed consent                            was obtained. Prior Anticoagulants: The patient                            last took aspirin 1 day prior to the procedure. ASA                            Grade Assessment: III - A patient with severe                            systemic disease. After reviewing the risks and                            benefits, the patient was deemed in satisfactory                            condition to undergo the procedure.                           After obtaining informed consent, the colonoscope  was passed under direct vision. Throughout the                            procedure, the patient's blood pressure, pulse, and                            oxygen saturations were monitored continuously. The                            Colonoscope was introduced through the anus and                            advanced to the the terminal ileum, with                            identification  of the appendiceal orifice and IC                            valve. The colonoscopy was performed without                            difficulty. The patient tolerated the procedure                            well. The quality of the bowel preparation was                            good. The ileocecal valve, appendiceal orifice, and                            rectum were photographed. The bowel preparation                            used was Miralax. Scope In: 9:50:14 AM Scope Out: 9:58:34 AM Scope Withdrawal Time: 0 hours 6 minutes 34 seconds  Total Procedure Duration: 0 hours 8 minutes 20 seconds  Findings:                 The perianal and digital rectal examinations were                            normal. Pertinent negatives include normal prostate                            (size, shape, and consistency).                           Many large-mouthed diverticula were found in the                            sigmoid colon.                           The exam was otherwise without abnormality on  direct and retroflexion views. Complications:            No immediate complications. Estimated Blood Loss:     Estimated blood loss: none. Impression:               - Severe diverticulosis in the sigmoid colon.                           - The examination was otherwise normal on direct                            and retroflexion views.                           - No specimens collected. Recommendation:           - Patient has a contact number available for                            emergencies. The signs and symptoms of potential                            delayed complications were discussed with the                            patient. Return to normal activities tomorrow.                            Written discharge instructions were provided to the                            patient.                           - Resume previous diet.                           -  Continue present medications.                           - Resume aspirin at prior dose in 2 weeks.                           - No repeat colonoscopy due to age and the absence                            of colonic polyps. Gatha Mayer, MD 09/13/2017 10:14:48 AM This report has been signed electronically.

## 2017-09-13 NOTE — Patient Instructions (Addendum)
Main finding is that you have multiple stomach polyps. Most small and benign looking - biopsied.  Two were larger and inflamed - and suspect leaking some blood over time - I removed these and placed clips to reduce and stop bleeding after removal.  Colonoscopy showed diverticulosis.  Stay off aspirin and similar medications (ibuprofen, aleve) x 2 weeks  I appreciate the opportunity to care for you. Gatha Mayer, MD, [facility]  Discharge instructions given. Biopsies taken. Card on clips given. No ibuprofen, naproxen, or other NSAIDs for two weeks. Resume aspirin at prior dose in 2 weeks. YOU HAD AN ENDOSCOPIC PROCEDURE TODAY AT [facility]:   Refer to the procedure report that was given to you for any specific questions about what was found during the examination.  If the procedure report does not answer your questions, please call your gastroenterologist to clarify.  If you requested that your care partner not be given the details of your procedure findings, then the procedure report has been included in a sealed envelope for you to review at your convenience later.  YOU SHOULD EXPECT: Some feelings of bloating in the abdomen. Passage of more gas than usual.  Walking can help get rid of the air that was put into your GI tract during the procedure and reduce the bloating. If you had a lower endoscopy (such as a colonoscopy or flexible sigmoidoscopy) you may notice spotting of blood in your stool or on the toilet paper. If you underwent a bowel prep for your procedure, you may not have a normal bowel movement for a few days.  Please Note:  You might notice some irritation and congestion in your nose or some drainage.  This is from the oxygen used during your procedure.  There is no need for concern and it should clear up in a day or so.  SYMPTOMS TO REPORT IMMEDIATELY:   Following lower endoscopy (colonoscopy or flexible sigmoidoscopy):  Excessive amounts of blood in  the stool  Significant tenderness or worsening of abdominal pains  Swelling of the abdomen that is new, acute  Fever of 100F or higher   Following upper endoscopy (EGD)  Vomiting of blood or coffee ground material  New chest pain or pain under the shoulder blades  Painful or persistently difficult swallowing  New shortness of breath  Fever of 100F or higher  Black, tarry-looking stools  For urgent or emergent issues, a gastroenterologist can be reached at any hour by calling [phone].   DIET:  We do recommend a small meal at first, but then you may proceed to your regular diet.  Drink plenty of fluids but you should avoid alcoholic beverages for 24 hours.  ACTIVITY:  You should plan to take it easy for the rest of today and you should NOT DRIVE or use heavy machinery until tomorrow (because of the sedation medicines used during the test).    FOLLOW UP: Our staff will call the number listed on your records the next business day following your procedure to check on you and address any questions or concerns that you may have regarding the information given to you following your procedure. If we do not reach you, we will leave a message.  However, if you are feeling well and you are not experiencing any problems, there is no need to return our call.  We will assume that you have returned to your regular daily activities without incident.  If any biopsies were taken you will  be contacted by phone or by letter within the next 1-3 weeks.  Please call us at [phone] if you have not heard about the biopsies in 3 weeks.    SIGNATURES/CONFIDENTIALITY: You and/or your care partner have signed paperwork which will be entered into your electronic medical record.  These signatures attest to the fact that that the information above on your After Visit Summary has been reviewed and is understood.  Full responsibility of the confidentiality of this discharge information lies with you and/or  your care-partner.

## 2017-09-13 NOTE — Op Note (Signed)
[place] Patient Name: Marvin Frost Procedure Date: 09/13/2017 9:19 AM MRN: [id number] Endoscopist: Gatha Mayer , MD Age: 72 Referring MD:  Date of Birth: 1946-02-28 Gender: Male Account #: [id number] Procedure:                Upper GI endoscopy Indications:              Iron deficiency anemia, Heme positive stool Medicines:                Propofol per Anesthesia, Monitored Anesthesia Care Procedure:                Pre-Anesthesia Assessment:                           - Prior to the procedure, a History and Physical                            was performed, and patient medications and                            allergies were reviewed. The patient's tolerance of                            previous anesthesia was also reviewed. The risks                            and benefits of the procedure and the sedation                            options and risks were discussed with the patient.                            All questions were answered, and informed consent                            was obtained. Prior Anticoagulants: The patient                            last took aspirin 1 day prior to the procedure. ASA                            Grade Assessment: III - A patient with severe                            systemic disease. After reviewing the risks and                            benefits, the patient was deemed in satisfactory                            condition to undergo the procedure.                           After obtaining informed consent, the endoscope was  passed under direct vision. Throughout the                            procedure, the patient's blood pressure, pulse, and                            oxygen saturations were monitored continuously. The                            Model GIF-[id number]) scope was introduced                            through the mouth, and advanced to the second part   of duodenum. The upper GI endoscopy was                            accomplished without difficulty. The patient                            tolerated the procedure well. Scope In: Scope Out: Findings:                 Multiple small semi-sessile polyps with no stigmata                            of recent bleeding were found in the gastric fundus                            and in the gastric body. Biopsies were taken with a                            cold forceps for histology. Verification of patient                            identification for the specimen was done. Estimated                            blood loss was minimal.                           A single 12 mm sessile polyp with stigmata of                            recent bleeding was found in the gastric antrum.                            The polyp was removed with a hot snare. Resection                            and retrieval were complete. Verification of                            patient identification for the specimen was done.  Estimated blood loss was minimal. To prevent                            bleeding after the polypectomy, one hemostatic clip                            was successfully placed (MR conditional). There was                            no bleeding at the end of the procedure.                           A single 10 mm sessile polyp with stigmata of                            recent bleeding was found on the posterior wall of                            the gastric body. The polyp was removed with a hot                            snare. Resection and retrieval were complete.                            Verification of patient identification for the                            specimen was done. Estimated blood loss was                            minimal. To prevent bleeding after the polypectomy,                            three hemostatic clips were successfully placed (MR                             conditional). There was no bleeding at the end of                            the maneuver.                           The cardia and gastric fundus were normal on                            retroflexion.                           The exam was otherwise without abnormality. Complications:            No immediate complications. Estimated Blood Loss:     Estimated blood loss was minimal. Impression:               - Multiple gastric polyps. Biopsied.                           -  A single gastric polyp. Resected and retrieved.                            Clip (MR conditional) was placed.                           - A single gastric polyp. Resected and retrieved.                            Clips (MR conditional) were placed.                           - The examination was otherwise normal. Recommendation:           - Patient has a contact number available for                            emergencies. The signs and symptoms of potential                            delayed complications were discussed with the                            patient. Return to normal activities tomorrow.                            Written discharge instructions were provided to the                            patient.                           - Resume previous diet.                           - Continue present medications.                           - No aspirin, ibuprofen, naproxen, or other                            non-steroidal anti-inflammatory drugs for 2 weeks                            after polyp removal.                           - Resume aspirin at prior dose in 2 weeks.                           - See the other procedure note for documentation of                            additional recommendations. Gatha Mayer, MD 09/13/2017 10:12:18 AM This report has been signed electronically.

## 2017-09-13 NOTE — Progress Notes (Signed)
Report given to PACU, vss 

## 2017-09-14 ENCOUNTER — Ambulatory Visit: Payer: Medicare Other | Admitting: Cardiology

## 2017-09-14 ENCOUNTER — Telehealth: Payer: Self-pay | Admitting: *Deleted

## 2017-09-14 NOTE — Telephone Encounter (Signed)
  Follow up Call-  Call back number 09/13/2017  Post procedure Call Back phone  # [phone]  Permission to leave phone message Yes  Some recent data might be hidden     Patient questions:  Do you have a fever, pain , or abdominal swelling? No. Pain Score  0 *  Have you tolerated food without any problems? Yes.    Have you been able to return to your normal activities? Yes.    Do you have any questions about your discharge instructions: Diet   No. Medications  Yes.   Follow up visit  No.  Do you have questions or concerns about your Care? No.  Actions: * If pain score is 4 or above: No action needed, pain <4.

## 2017-09-18 ENCOUNTER — Encounter: Payer: Self-pay | Admitting: Internal Medicine

## 2017-09-18 DIAGNOSIS — K317 Polyp of stomach and duodenum: Secondary | ICD-10-CM

## 2017-09-18 HISTORY — DX: Polyp of stomach and duodenum: K31.7

## 2017-09-18 NOTE — Progress Notes (Signed)
Benign fundic gland and hyperplastic stomach polyps Recall EGD 1 year My Chart letter

## 2017-09-20 DIAGNOSIS — Z96642 Presence of left artificial hip joint: Secondary | ICD-10-CM | POA: Diagnosis not present

## 2017-09-20 DIAGNOSIS — M79652 Pain in left thigh: Secondary | ICD-10-CM | POA: Diagnosis not present

## 2017-09-20 DIAGNOSIS — M25552 Pain in left hip: Secondary | ICD-10-CM | POA: Diagnosis not present

## 2017-09-20 DIAGNOSIS — M79659 Pain in unspecified thigh: Secondary | ICD-10-CM | POA: Diagnosis not present

## 2017-09-20 NOTE — Progress Notes (Signed)
HPI The patient presents for follow up of nonobstructive CAD.  His last cath was 2004. His last stress test in July of 2018.  Since I last saw him he has done well.  He is limited by hip pain.  The patient denies any new symptoms such as chest discomfort, neck or arm discomfort. There has been no new shortness of breath, PND or orthopnea. There have been no reported palpitations, presyncope or syncope.   Allergies  Allergen Reactions  . Liraglutide Other (See Comments)    Abdominal pain and enlarged pancreas on CT ? pancreatitis  . Baxinets [Cetylpyridinium-Benzocaine]     unknown  . Celebrex [Celecoxib] Nausea Only  . Toprol Xl [Metoprolol Succinate] Other (See Comments)    Unknown   . Triplex Ad Other (See Comments)    Abdominal pain and enlarged pancreas on CT? pancreatitis  . Welchol [Colesevelam Hcl] Other (See Comments)    constipation  . Norvasc [Amlodipine Besylate] Rash  . Statins Other (See Comments)    Unknown- muscle and joint pain if taken for long periods of time, takes Crestor every 2-3 days to avoid this reaction    Current Outpatient Medications  Medication Sig Dispense Refill  . aspirin 81 MG tablet Take 81 mg by mouth daily.    . Blood Glucose Monitoring Suppl (FREESTYLE LITE) DEVI Use to check BS bid. DX. E11.9 1 each 0  . cetirizine (ZYRTEC) 10 MG tablet Take 10 mg by mouth as needed.     . Cholecalciferol (VITAMIN D3) 5000 UNITS TABS Take 1 tablet by mouth daily.     Marland Kitchen FeFum-FePoly-FA-B Cmp-C-Biot (INTEGRA PLUS) CAPS Take 1 capsule by mouth daily. 90 capsule 1  . ferrous sulfate 325 (65 FE) MG tablet Take 1 tablet (325 mg total) by mouth every other day.  3  . glucose blood (FREESTYLE LITE) test strip USE TO CHECK BLOOD SUGAR TWICE A DAY AND AS NEEDED 300 each 3  . Icosapent Ethyl (VASCEPA) 1 g CAPS Take 2 capsules by mouth 2 (two) times daily. 360 capsule 0  . Insulin Degludec (TRESIBA FLEXTOUCH) 200 UNIT/ML SOPN Inject 60-70 Units into the skin daily. 60  mL 3  . Insulin Pen Needle (NOVOTWIST) 32G X 5 MM MISC USE WITH INSULIN PENS UP TO FIVE TIMES PER DAY 300 each 1  . Lancets (FREESTYLE) lancets Check BS BID and PRN. DX.E11.9 300 each 3  . Multiple Vitamin (MULTIVITAMIN) capsule Take 1 capsule by mouth daily after supper.     Marland Kitchen NOVOLOG FLEXPEN 100 UNIT/ML FlexPen INJECT 16 TO 20 UNITS UNDER THE SKIN TWICE A DAY 45 mL 3  . olmesartan-hydrochlorothiazide (BENICAR HCT) 40-25 MG tablet Take 1 tablet by mouth daily. 90 tablet 3  . pantoprazole (PROTONIX) 40 MG tablet Take 1 tablet (40 mg total) by mouth 2 (two) times daily. 180 tablet 3  . REPATHA SURECLICK 326 MG/ML SOAJ INJECT 140 MG UNDER THE SKIN EVERY 14 DAYS 6 mL 4  . rosuvastatin (CRESTOR) 10 MG tablet Take 1 tablet (10 mg total) by mouth every 3 (three) days. 90 tablet 3  . SYNJARDY 12.10-998 MG TABS TAKE 1 TABLET TWICE A DAY 180 tablet 0   No current facility-administered medications for this visit.     Past Medical History:  Diagnosis Date  . Allergy   . Arthritis   . CAD (coronary artery disease)    Nonobstructive Cath 2004  . 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 Cataract   . Diabetes mellitus   . Dysrhythmia    history of heart palpitations   . Gastric polyps 09/18/2017  . GERD (gastroesophageal reflux disease)   . Heart palpitations    last episode- 09/2014 seen by Dr Percival Spanish 5/16- clearance for surgery   . History of frequent urinary tract infections   . History of hiatal hernia   . Hyperlipemia   . Hypertension   . Obesity   . Pancreatitis     Past Surgical History:  Procedure Laterality Date  . CARDIAC CATHETERIZATION     2004 and 1999- done by Dr Percival Spanish   . COLONOSCOPY  11/2003   diverticulosis, no polyps  . EYE SURGERY Bilateral    cateracts - dr Katy Fitch  . HYDROCELE EXCISION / REPAIR    . left calf surgery      related to trauma of being torn   . left index finger arthrioscopic surgery     . lymph node removed from right neck      . right shoulder surgery    . TOTAL HIP ARTHROPLASTY Right 03/16/2015   Procedure: RIGHT TOTAL HIP ARTHROPLASTY ANTERIOR APPROACH;  Surgeon: Paralee Cancel, MD;  Location: [facility];  Service: Orthopedics;  Laterality: Right;  . TOTAL HIP ARTHROPLASTY Left 06/22/2015   Procedure: LEFT TOTAL HIP ARTHROPLASTY ANTERIOR APPROACH;  Surgeon: Paralee Cancel, MD;  Location: [facility];  Service: Orthopedics;  Laterality: Left;  . UPPER GASTROINTESTINAL ENDOSCOPY  08/19/2010   hiatal hernia 2cm, mild gastritis    ROS:     As stated in the HPI and negative for all other systems.  PHYSICAL EXAM BP 125/78   Pulse 86   Ht 5\' 8"  (1.727 m)   Wt 224 lb (101.6 kg)   BMI 34.06 kg/m   GENERAL:  Well appearing NECK:  No jugular venous distention, waveform within normal limits, carotid upstroke brisk and symmetric, no bruits, no thyromegaly LUNGS:  Clear to auscultation bilaterally CHEST:  Unremarkable HEART:  PMI not displaced or sustained,S1 and S2 within normal limits, no S3, no S4, no clicks, no rubs, no murmurs ABD:  Flat, positive bowel sounds normal in frequency in pitch, no bruits, no rebound, no guarding, no midline pulsatile mass, no hepatomegaly, no splenomegaly EXT:  2 plus pulses throughout, no edema, no cyanosis no clubbing    Lab Results  Component Value Date   CHOL 155 06/12/2017   TRIG 215 (H) 06/12/2017   HDL 46 06/12/2017   LDLCALC 66 06/12/2017   LDLDIRECT 39 11/02/2016    EKG: Normal sinus rhythm, rate 86, left axis deviation, intervals within normal limits, no acute ST-T wave changes.09/21/2017   ASSESSMENT AND PLAN  CAD -  The patient has no new sypmtoms.  No further cardiovascular testing is indicated.  We will continue with aggressive risk reduction and meds as listed.   HYPERLIPIDEMIA -  His lipids are excellent.  No change in therapy.    HYPERTENSION -  The blood pressure is at target. No change in medications is indicated. We will continue with therapeutic lifestyle  changes (TLC).  DM -  His A1C was   A1c was 7.5.  He will remain on meds as listed.

## 2017-09-21 ENCOUNTER — Encounter: Payer: Self-pay | Admitting: Cardiology

## 2017-09-21 ENCOUNTER — Ambulatory Visit (INDEPENDENT_AMBULATORY_CARE_PROVIDER_SITE_OTHER): Payer: Medicare Other | Admitting: Cardiology

## 2017-09-21 VITALS — BP 125/78 | HR 86 | Ht 68.0 in | Wt 224.0 lb

## 2017-09-21 DIAGNOSIS — E785 Hyperlipidemia, unspecified: Secondary | ICD-10-CM | POA: Insufficient documentation

## 2017-09-21 DIAGNOSIS — I10 Essential (primary) hypertension: Secondary | ICD-10-CM

## 2017-09-21 DIAGNOSIS — I2510 Atherosclerotic heart disease of native coronary artery without angina pectoris: Secondary | ICD-10-CM | POA: Diagnosis not present

## 2017-09-21 NOTE — Patient Instructions (Signed)
Medication Instructions:  Continue current medications  If you need a refill on your cardiac medications before your next appointment, please call your pharmacy.  Labwork: None Ordered   Testing/Procedures: None Ordered  Follow-Up: Your physician wants you to follow-up in: 1 Year. You should receive a reminder letter in the mail two months in advance. If you do not receive a letter, please call our office [phone].    Thank you for choosing [facility]!!      

## 2017-09-22 ENCOUNTER — Other Ambulatory Visit: Payer: Self-pay | Admitting: Family Medicine

## 2017-09-25 ENCOUNTER — Other Ambulatory Visit (HOSPITAL_COMMUNITY): Payer: Self-pay | Admitting: Physician Assistant

## 2017-09-25 DIAGNOSIS — M25552 Pain in left hip: Secondary | ICD-10-CM

## 2017-10-04 ENCOUNTER — Encounter (HOSPITAL_COMMUNITY)
Admission: RE | Admit: 2017-10-04 | Discharge: 2017-10-04 | Disposition: A | Discharge status: Home / Self Care | Payer: Medicare Other | Source: Ambulatory Visit | Attending: Physician Assistant | Admitting: Physician Assistant

## 2017-10-04 DIAGNOSIS — M25552 Pain in left hip: Secondary | ICD-10-CM | POA: Insufficient documentation

## 2017-10-04 MED ORDER — TECHNETIUM TC 99M MEDRONATE IV KIT
25.0000 | PACK | Freq: Once | INTRAVENOUS | Status: AC | PRN
  Administered 2017-10-04: 25 via INTRAVENOUS

## 2017-10-06 ENCOUNTER — Other Ambulatory Visit: Payer: Self-pay | Admitting: Family Medicine

## 2017-10-08 NOTE — Telephone Encounter (Signed)
OV 10/15/17

## 2017-10-11 DIAGNOSIS — M25552 Pain in left hip: Secondary | ICD-10-CM | POA: Diagnosis not present

## 2017-10-11 DIAGNOSIS — M160 Bilateral primary osteoarthritis of hip: Secondary | ICD-10-CM | POA: Diagnosis not present

## 2017-10-11 DIAGNOSIS — M25551 Pain in right hip: Secondary | ICD-10-CM | POA: Diagnosis not present

## 2017-10-11 DIAGNOSIS — M545 Low back pain, unspecified: Secondary | ICD-10-CM | POA: Insufficient documentation

## 2017-10-15 ENCOUNTER — Ambulatory Visit (INDEPENDENT_AMBULATORY_CARE_PROVIDER_SITE_OTHER): Payer: Medicare Other | Admitting: Family Medicine

## 2017-10-15 ENCOUNTER — Encounter: Payer: Self-pay | Admitting: Family Medicine

## 2017-10-15 VITALS — BP 139/76 | HR 97 | Temp 97.6°F | Ht 68.0 in | Wt 223.0 lb

## 2017-10-15 DIAGNOSIS — E119 Type 2 diabetes mellitus without complications: Secondary | ICD-10-CM

## 2017-10-15 DIAGNOSIS — E1159 Type 2 diabetes mellitus with other circulatory complications: Secondary | ICD-10-CM

## 2017-10-15 DIAGNOSIS — K219 Gastro-esophageal reflux disease without esophagitis: Secondary | ICD-10-CM

## 2017-10-15 DIAGNOSIS — E785 Hyperlipidemia, unspecified: Secondary | ICD-10-CM | POA: Diagnosis not present

## 2017-10-15 DIAGNOSIS — K317 Polyp of stomach and duodenum: Secondary | ICD-10-CM

## 2017-10-15 DIAGNOSIS — Z6834 Body mass index (BMI) 34.0-34.9, adult: Secondary | ICD-10-CM | POA: Diagnosis not present

## 2017-10-15 DIAGNOSIS — R7989 Other specified abnormal findings of blood chemistry: Secondary | ICD-10-CM | POA: Diagnosis not present

## 2017-10-15 DIAGNOSIS — R710 Precipitous drop in hematocrit: Secondary | ICD-10-CM

## 2017-10-15 DIAGNOSIS — E782 Mixed hyperlipidemia: Secondary | ICD-10-CM | POA: Diagnosis not present

## 2017-10-15 DIAGNOSIS — N400 Enlarged prostate without lower urinary tract symptoms: Secondary | ICD-10-CM

## 2017-10-15 DIAGNOSIS — E1169 Type 2 diabetes mellitus with other specified complication: Secondary | ICD-10-CM

## 2017-10-15 DIAGNOSIS — I2510 Atherosclerotic heart disease of native coronary artery without angina pectoris: Secondary | ICD-10-CM | POA: Diagnosis not present

## 2017-10-15 LAB — CBC WITH DIFFERENTIAL/PLATELET
Basophils Absolute: 0.2 10*3/uL (ref 0.0–0.2)
Basos: 2 %
EOS (ABSOLUTE): 2.8 10*3/uL — ABNORMAL HIGH (ref 0.0–0.4)
EOS: 19 %
HEMATOCRIT: 39.5 % (ref 37.5–51.0)
Hemoglobin: 12 g/dL — ABNORMAL LOW (ref 13.0–17.7)
Immature Grans (Abs): 0.2 10*3/uL — ABNORMAL HIGH (ref 0.0–0.1)
Immature Granulocytes: 1 %
LYMPHS ABS: 1.8 10*3/uL (ref 0.7–3.1)
Lymphs: 12 %
MCH: 20.7 pg — AB (ref 26.6–33.0)
MCHC: 30.4 g/dL — AB (ref 31.5–35.7)
MCV: 68 fL — AB (ref 79–97)
MONOCYTES: 6 %
Monocytes Absolute: 0.9 10*3/uL (ref 0.1–0.9)
NEUTROS ABS: 9.2 10*3/uL — AB (ref 1.4–7.0)
Neutrophils: 60 %
Platelets: 460 10*3/uL — ABNORMAL HIGH (ref 150–379)
RBC: 5.8 x10E6/uL (ref 4.14–5.80)
RDW: 19.1 % — ABNORMAL HIGH (ref 12.3–15.4)
WBC: 15 10*3/uL — ABNORMAL HIGH (ref 3.4–10.8)

## 2017-10-15 LAB — URINALYSIS, COMPLETE
Bilirubin, UA: NEGATIVE
Ketones, UA: NEGATIVE
LEUKOCYTES UA: NEGATIVE
Nitrite, UA: NEGATIVE
PH UA: 5 (ref 5.0–7.5)
PROTEIN UA: NEGATIVE
RBC, UA: NEGATIVE
Specific Gravity, UA: 1.015 (ref 1.005–1.030)
UUROB: 0.2 mg/dL (ref 0.2–1.0)

## 2017-10-15 LAB — MICROSCOPIC EXAMINATION
BACTERIA UA: NONE SEEN
Epithelial Cells (non renal): NONE SEEN /hpf (ref 0–10)
RBC, UA: NONE SEEN /hpf (ref 0–2)
RENAL EPITHEL UA: NONE SEEN /HPF
WBC UA: NONE SEEN /HPF (ref 0–5)

## 2017-10-15 LAB — BAYER DCA HB A1C WAIVED: HB A1C: 8.1 % — AB (ref <7.0)

## 2017-10-15 MED ORDER — INSULIN DEGLUDEC 200 UNIT/ML ~~LOC~~ SOPN: 60.0000 [IU] | PEN_INJECTOR | Freq: Every day | SUBCUTANEOUS | 3 refills | Status: DC

## 2017-10-15 MED ORDER — INSULIN ASPART 100 UNIT/ML FLEXPEN: PEN_INJECTOR | SUBCUTANEOUS | 3 refills | Status: DC

## 2017-10-15 MED ORDER — EMPAGLIFLOZIN-METFORMIN HCL 12.5-1000 MG PO TABS: 1.0000 | ORAL_TABLET | Freq: Two times a day (BID) | ORAL | 3 refills | Status: DC

## 2017-10-15 NOTE — Progress Notes (Signed)
 Subjective:    Patient ID: Marvin Frost, male    DOB: 04/14/1946, 72 y.o.   MRN: [id number]  HPI Pt here for follow up and management of chronic medical problems which includes diabetes and hyperlipidemia. He is taking medication regularly.  Patient is continued to be followed by orthopedics because of ongoing back pain and has an appointment coming up soon to see Dr. Brooks.  He is here for a level for physical exam today.  He is requesting refills on several medicines.  His vital signs are stable.  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  He has had arthroplasties done of both hips.  The patient does have spinal stenosis and is due to get some shots before any kind of surgery as planned.  He understands that he will have to get a cardiac approval before any surgery is done.  He just saw the cardiologist and was given an okay and not to be seen for 1 year.  He did not discussed with him the possible need for back surgery.  Since his surgery has not been scheduled he will wait until that happens where he calls a cardiologist back to get an okay for surgery.  He denies any chest pain or shortness of breath.  He denies any trouble with nausea vomiting blood in the stool or black tarry bowel movements.  He does have loose bowel movements and this may be due to his metformin in the form of Synjardy.  We will consider splitting this medicine and going to the individual components and using the extended release metformin to see if this makes any difference with the loose bowel movements.  He is passing his water without problems.  We did review his most recent endoscopy and colonoscopy and the colonoscopy was clear.  He did have multiple polyps in the stomach and the patient says that all of the biopsies were negative.  I also reviewed the pathology reports and they were all negative.  The patient brings in  records from the orthopedic surgeon.    Patient Active Problem List   Diagnosis Date Noted  . Dyslipidemia 09/21/2017  . Coronary artery disease involving native coronary artery of native heart without angina pectoris 09/21/2017  . Gastric polyps 09/18/2017  . Diabetic retinopathy (HCC) 08/01/2017  . 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 Primary osteoarthritis of both knees 01/26/2015  . Nonspecific (abnormal) findings on radiological and other examination of gastrointestinal tract 09/27/2010  . NAUSEA AND VOMITING 08/16/2010  . Type 2 diabetes mellitus not at goal (HCC) 03/18/2009  . HYPERLIPIDEMIA 03/18/2009  . OBESITY 03/18/2009  . Essential hypertension 03/18/2009  . Coronary atherosclerosis 03/18/2009  . PAC 03/18/2009  . GERD 03/18/2009   Outpatient Encounter Medications as of 10/15/2017  Medication Sig  . aspirin 81 MG tablet Take 81 mg by mouth daily.  . BENICAR HCT 40-25 MG tablet TAKE 1 TABLET DAILY  . Blood Glucose Monitoring Suppl (FREESTYLE LITE) DEVI Use to check BS bid. DX. E11.9  . cetirizine (ZYRTEC) 10 MG tablet Take 10 mg by mouth as needed.   . Cholecalciferol (VITAMIN D3) 5000 UNITS TABS Take 1 tablet by mouth daily.   . FeFum-FePoly-FA-B Cmp-C-Biot (INTEGRA PLUS) CAPS Take 1 capsule by mouth daily.  . ferrous sulfate 325 (65 FE) MG tablet Take 1 tablet (325 mg total) by mouth every other day.  . glucose blood (  FREESTYLE LITE) test strip USE TO CHECK BLOOD SUGAR TWICE A DAY AND AS NEEDED  . Icosapent Ethyl (VASCEPA) 1 g CAPS Take 2 capsules by mouth 2 (two) times daily.  . Insulin Degludec (TRESIBA FLEXTOUCH) 200 UNIT/ML SOPN Inject 60-70 Units into the skin daily.  . Insulin Pen Needle (NOVOTWIST) 32G X 5 MM MISC USE WITH INSULIN PENS UP TO FIVE TIMES PER DAY  . Lancets (FREESTYLE) lancets Check BS BID and PRN. DX.E11.9  . Multiple Vitamin (MULTIVITAMIN) capsule Take 1 capsule by mouth daily after supper.   . NOVOLOG FLEXPEN 100 UNIT/ML FlexPen INJECT 16 TO 20 UNITS  UNDER THE SKIN TWICE A DAY  . pantoprazole (PROTONIX) 40 MG tablet Take 1 tablet (40 mg total) by mouth 2 (two) times daily.  . REPATHA SURECLICK 140 MG/ML SOAJ INJECT 140 MG UNDER THE SKIN EVERY 14 DAYS  . rosuvastatin (CRESTOR) 10 MG tablet Take 1 tablet (10 mg total) by mouth every 3 (three) days.  . SYNJARDY 12.10-998 MG TABS TAKE 1 TABLET TWICE A DAY   No facility-administered encounter medications on file as of 10/15/2017.      Review of Systems  Constitutional: Negative.   HENT: Negative.   Eyes: Negative.   Respiratory: Negative.   Cardiovascular: Negative.   Gastrointestinal: Negative.   Endocrine: Negative.   Genitourinary: Negative.   Musculoskeletal: Positive for back pain (seen Dr Olin - low back - referred to DR Brooks).  Skin: Negative.   Allergic/Immunologic: Negative.   Neurological: Negative.   Hematological: Negative.   Psychiatric/Behavioral: Negative.        Objective:   Physical Exam  Constitutional: He is oriented to person, place, and time. He appears well-developed and well-nourished.  She is pleasant and alert.  He discussed issues with having trouble getting his Repatha filled and I told him in the future to get in touch with me if these issues ever arise again.  HENT:  Head: Normocephalic and atraumatic.  Right Ear: External ear normal.  Left Ear: External ear normal.  Nose: Nose normal.  Mouth/Throat: Oropharynx is clear and moist. No oropharyngeal exudate.  Eyes: Pupils are equal, round, and reactive to light. Conjunctivae and EOM are normal. Right eye exhibits no discharge. Left eye exhibits no discharge. No scleral icterus.  Neck: Normal range of motion. Neck supple. No thyromegaly present.  No bruits thyromegaly or anterior cervical adenopathy  Cardiovascular: Normal rate, regular rhythm, normal heart sounds and intact distal pulses.  No murmur heard. The heart is regular at 84/min  Pulmonary/Chest: Effort normal and breath sounds normal. No  respiratory distress. He has no wheezes. He has no rales. He exhibits no tenderness.  Clear anteriorly and posteriorly without any axillary adenopathy or chest wall masses.  Abdominal: Soft. Bowel sounds are normal. He exhibits no mass. There is no tenderness. There is no rebound and no guarding.  Abdominal obesity without masses tenderness or organ enlargement or bruits  Genitourinary: Rectum normal, prostate normal and penis normal.  Genitourinary Comments: The prostate was slightly enlarged but soft and smooth.  There were no rectal masses.  The external genitalia revealed no hernia or testicular masses.  Musculoskeletal: He exhibits no edema.  Somewhat limited range of motion due to ongoing back pain.  Lymphadenopathy:    He has no cervical adenopathy.  Neurological: He is alert and oriented to person, place, and time. He has normal reflexes. No cranial nerve deficit.  Skin: Skin is warm and dry. No rash noted.  Psychiatric: He   has a normal mood and affect. His behavior is normal. Judgment and thought content normal.  Nursing note and vitals reviewed.   BP 139/76 (BP Location: Left Arm)   Pulse 97   Temp 97.6 F (36.4 C) (Oral)   Ht 5' 8" (1.727 m)   Wt 223 lb (101.2 kg)   BMI 33.91 kg/m        Assessment & Plan:  1. Decreased hemoglobin -The finding was recently made by gastroenterologist that the patient had multiple gastric polyps and the biopsies on all these polyps were negative.  Hopefully this will result in a more stable hemoglobin and he is currently not taking any iron. - CBC with Differential/Platelet - CBC with Differential/Platelet  2. Type 2 diabetes mellitus with other circulatory complications (HCC) -Unfortunately he is not taking his blood sugars regularly as he should be and he understands to check these more often - CBC with Differential/Platelet - BMP8+EGFR - Bayer DCA Hb A1c Waived  3. Hyperlipidemia associated with type 2 diabetes mellitus  ([place]) -Continue with Repatha and Crestor and therapeutic lifestyle changes as currently doing - CBC with Differential/Platelet - Lipid panel  4. Low serum vitamin D -Continue with vitamin D pending results of lab work - CBC with Differential/Platelet - VITAMIN D 25 Hydroxy (Vit-D Deficiency, Fractures)  5. Gastroesophageal reflux disease, esophagitis presence not specified -Continue with anti-reflux medicine which is pantoprazole. - CBC with Differential/Platelet - Hepatic function panel  6. Benign prostatic hyperplasia, unspecified whether lower urinary tract symptoms present -The prostate remains slightly enlarged but no urinary tract symptoms - CBC with Differential/Platelet - PSA, total and free - Urinalysis, Complete  7. Gastroesophageal reflux disease without esophagitis -Continue with Protonix - CBC with Differential/Platelet  8. Type 2 diabetes mellitus not at goal [facility]) -Continue with aggressive therapeutic lifestyle changes pending results of lab work.  The patient admits to not being able to get a lot of physical activity because of his back pain radiating to both legs. - CBC with Differential/Platelet - BMP8+EGFR  9. Hyperlipidemia LDL goal <70 -Continue with current treatment and aggressive therapeutic lifestyle changes as possible. - CBC with Differential/Platelet - BMP8+EGFR  10. Combined hyperlipidemia associated with type 2 diabetes mellitus ([place]) -.  Lifestyle changes - CBC with Differential/Platelet  11. Multiple gastric polyps -Follow-up with gastroenterology as planned  12. BMI 34.0-34.9,adult -The patient's orthopedic problems seem to have played a role with ongoing need to exercise.  Hopefully this can be taken care of soon so that he can continue and resume exercise regimen to help lower his BMI.  Meds ordered this encounter  Medications  . Empagliflozin-metFORMIN HCl (SYNJARDY) 12.10-998 MG TABS    Sig: Take 1 tablet by mouth 2 (two) times  daily.    Dispense:  180 tablet    Refill:  3  . insulin aspart (NOVOLOG FLEXPEN) 100 UNIT/ML FlexPen    Sig: INJECT 16 TO 20 UNITS UNDER THE SKIN TWICE A DAY    Dispense:  45 mL    Refill:  3  . Insulin Degludec (TRESIBA FLEXTOUCH) 200 UNIT/ML SOPN    Sig: Inject 60-70 Units into the skin daily.    Dispense:  60 mL    Refill:  3   Patient Instructions                       Medicare Annual Wellness Visit  [facility] and the medical providers at [facility] strive to bring you the  best medical care.  In doing so we not only want to address your current medical conditions and concerns but also to detect new conditions early and prevent illness, disease and health-related problems.    Medicare offers a yearly Wellness Visit which allows our clinical staff to assess your need for preventative services including immunizations, lifestyle education, counseling to decrease risk of preventable diseases and screening for fall risk and other medical concerns.    This visit is provided free of charge (no copay) for all Medicare recipients. The clinical pharmacists at [facility] have begun to conduct these Wellness Visits which will also include a thorough review of all your medications.    As you primary medical provider recommend that you make an appointment for your Annual Wellness Visit if you have not done so already this year.  You may set up this appointment before you leave today or you may call back ([phone]) and schedule an appointment.  Please make sure when you call that you mention that you are scheduling your Annual Wellness Visit with the clinical pharmacist so that the appointment may be made for the proper length of time.     Continue current medications. Continue good therapeutic lifestyle changes which include good diet and exercise. Fall precautions discussed with patient. If an FOBT was given today- please return it to our front  desk. If you are over 50 years old - you may need Prevnar 13 or the adult Pneumonia vaccine.  **Flu shots are available--- please call and schedule a FLU-CLINIC appointment**  After your visit with us today you will receive a survey in the mail or online from [employer] regarding your care with us. Please take a moment to fill this out. Your feedback is very important to us as you can help us better understand your patient needs as well as improve your experience and satisfaction. WE CARE ABOUT YOU!!!     Don W. Moore MD   

## 2017-10-15 NOTE — Patient Instructions (Addendum)
Medicare Annual Wellness Visit  [facility] and the medical providers at [facility] strive to bring you the best medical care.  In doing so we not only want to address your current medical conditions and concerns but also to detect new conditions early and prevent illness, disease and health-related problems.    Medicare offers a yearly Wellness Visit which allows our clinical staff to assess your need for preventative services including immunizations, lifestyle education, counseling to decrease risk of preventable diseases and screening for fall risk and other medical concerns.    This visit is provided free of charge (no copay) for all Medicare recipients. The clinical pharmacists at [facility] have begun to conduct these Wellness Visits which will also include a thorough review of all your medications.    As you primary medical provider recommend that you make an appointment for your Annual Wellness Visit if you have not done so already this year.  You may set up this appointment before you leave today or you may call back ([phone]) and schedule an appointment.  Please make sure when you call that you mention that you are scheduling your Annual Wellness Visit with the clinical pharmacist so that the appointment may be made for the proper length of time.     Continue current medications. Continue good therapeutic lifestyle changes which include good diet and exercise. Fall precautions discussed with patient. If an FOBT was given today- please return it to our front desk. If you are over 73 years old - you may need Prevnar 30 or the adult Pneumonia vaccine.  **Flu shots are available--- please call and schedule a FLU-CLINIC appointment**  After your visit with [place] today you will receive a survey in the mail or online from [employer] regarding your care with [place]. Please take a moment to fill this out. Your feedback is very  important to [place] as you can help [place] better understand your patient needs as well as improve your experience and satisfaction. WE CARE ABOUT YOU!!!   Follow-up with orthopedist as planned When the results are called of your lab work we will consider at that time splitting the Synjardy into 2 different medicines and using the extended release metformin In the meantime reduce caffeine intake as much as possible

## 2017-10-16 LAB — LIPID PANEL
CHOL/HDL RATIO: 4 ratio (ref 0.0–5.0)
Cholesterol, Total: 206 mg/dL — ABNORMAL HIGH (ref 100–199)
HDL: 52 mg/dL (ref >39)
LDL CALC: 131 mg/dL — AB (ref 0–99)
TRIGLYCERIDES: 116 mg/dL (ref 0–149)
VLDL Cholesterol Cal: 23 mg/dL (ref 5–40)

## 2017-10-16 LAB — BMP8+EGFR
BUN / CREAT RATIO: 15 (ref 10–24)
BUN: 15 mg/dL (ref 8–27)
CO2: 22 mmol/L (ref 20–29)
Calcium: 9.4 mg/dL (ref 8.6–10.2)
Chloride: 98 mmol/L (ref 96–106)
Creatinine, Ser: 0.97 mg/dL (ref 0.76–1.27)
GFR, EST AFRICAN AMERICAN: 89 mL/min/{1.73_m2} (ref >59)
GFR, EST NON AFRICAN AMERICAN: 77 mL/min/{1.73_m2} (ref >59)
Glucose: 202 mg/dL — ABNORMAL HIGH (ref 65–99)
Potassium: 4.6 mmol/L (ref 3.5–5.2)
SODIUM: 138 mmol/L (ref 134–144)

## 2017-10-16 LAB — HEPATIC FUNCTION PANEL
ALT: 17 IU/L (ref 0–44)
AST: 15 IU/L (ref 0–40)
Albumin: 4.4 g/dL (ref 3.5–4.8)
Alkaline Phosphatase: 69 IU/L (ref 39–117)
BILIRUBIN, DIRECT: 0.09 mg/dL (ref 0.00–0.40)
Bilirubin Total: 0.2 mg/dL (ref 0.0–1.2)
Total Protein: 6.8 g/dL (ref 6.0–8.5)

## 2017-10-16 LAB — PSA, TOTAL AND FREE
PROSTATE SPECIFIC AG, SERUM: 1.7 ng/mL (ref 0.0–4.0)
PSA FREE PCT: 25.9 %
PSA, Free: 0.44 ng/mL

## 2017-10-16 LAB — VITAMIN D 25 HYDROXY (VIT D DEFICIENCY, FRACTURES): VIT D 25 HYDROXY: 48.8 ng/mL (ref 30.0–100.0)

## 2017-10-28 ENCOUNTER — Other Ambulatory Visit: Payer: Self-pay | Admitting: Family Medicine

## 2017-11-06 DIAGNOSIS — M5136 Other intervertebral disc degeneration, lumbar region: Secondary | ICD-10-CM | POA: Insufficient documentation

## 2017-11-09 ENCOUNTER — Encounter: Payer: Self-pay | Admitting: Pharmacist Clinician (PhC)/ Clinical Pharmacy Specialist

## 2017-11-09 ENCOUNTER — Ambulatory Visit (INDEPENDENT_AMBULATORY_CARE_PROVIDER_SITE_OTHER): Payer: Medicare Other | Admitting: Pharmacist Clinician (PhC)/ Clinical Pharmacy Specialist

## 2017-11-09 DIAGNOSIS — I2510 Atherosclerotic heart disease of native coronary artery without angina pectoris: Secondary | ICD-10-CM | POA: Diagnosis not present

## 2017-11-09 DIAGNOSIS — E1159 Type 2 diabetes mellitus with other circulatory complications: Secondary | ICD-10-CM

## 2017-11-09 NOTE — Progress Notes (Signed)
Long standing diabetes with worsening control secondary to multiple courses of prednisone and steroid shots.  Has not been able to exercise with back pain.  Eating out a lot has been difficult with high CHO content.   Daily Diet: Breakfast:  Bacon and eggs, white-wheat bread toasted. 1 cup coffee cafeinated  Lunch:  Sandwich or salad with grilled chicken.  Hamburger.  Eating out so chips or fries. Water  Dinner:  Eat out [employer] salad mostly wise choices and water  Snacks:  Cookies- butter cookies  Eat a lot of apples  Patient adjusts novolog per blood sugars by adding 15-20 units in the middle of the day/  Sticks with 68 units with [place]  A/P:    1. Increase Tresiba to 72 units and monitor Blood glucose to adjust novolog down as needed.  Will plan on dropping novolog to 22 units before lunch and dinner.  Continue Synjardy for another 3-4 weeks and then decrease to 1 a day to see what benefit he is getting from this medication.  He always gives novolg 25 units before breakfast and dinner and rarely 15-20u before lunch.    2.  Counseled patient on diet and exercise in detail.

## 2017-11-20 DIAGNOSIS — M5136 Other intervertebral disc degeneration, lumbar region: Secondary | ICD-10-CM | POA: Diagnosis not present

## 2017-11-20 DIAGNOSIS — M5030 Other cervical disc degeneration, unspecified cervical region: Secondary | ICD-10-CM | POA: Diagnosis not present

## 2017-11-27 DIAGNOSIS — M542 Cervicalgia: Secondary | ICD-10-CM | POA: Diagnosis not present

## 2017-11-27 DIAGNOSIS — M545 Low back pain: Secondary | ICD-10-CM | POA: Diagnosis not present

## 2017-12-12 DIAGNOSIS — M542 Cervicalgia: Secondary | ICD-10-CM | POA: Diagnosis not present

## 2017-12-12 DIAGNOSIS — M545 Low back pain: Secondary | ICD-10-CM | POA: Diagnosis not present

## 2018-01-16 ENCOUNTER — Other Ambulatory Visit: Payer: Self-pay | Admitting: Family Medicine

## 2018-02-11 ENCOUNTER — Other Ambulatory Visit: Payer: Self-pay | Admitting: Physical Medicine and Rehabilitation

## 2018-02-11 DIAGNOSIS — M5136 Other intervertebral disc degeneration, lumbar region: Secondary | ICD-10-CM

## 2018-02-14 DIAGNOSIS — Z961 Presence of intraocular lens: Secondary | ICD-10-CM | POA: Diagnosis not present

## 2018-02-14 DIAGNOSIS — E119 Type 2 diabetes mellitus without complications: Secondary | ICD-10-CM | POA: Diagnosis not present

## 2018-02-14 DIAGNOSIS — H26493 Other secondary cataract, bilateral: Secondary | ICD-10-CM | POA: Diagnosis not present

## 2018-02-14 LAB — HM DIABETES EYE EXAM

## 2018-02-15 ENCOUNTER — Ambulatory Visit: Payer: Medicare Other | Admitting: Family Medicine

## 2018-02-19 ENCOUNTER — Other Ambulatory Visit: Payer: Medicare Other

## 2018-02-19 DIAGNOSIS — E1159 Type 2 diabetes mellitus with other circulatory complications: Secondary | ICD-10-CM | POA: Diagnosis not present

## 2018-02-19 DIAGNOSIS — R7989 Other specified abnormal findings of blood chemistry: Secondary | ICD-10-CM | POA: Diagnosis not present

## 2018-02-19 DIAGNOSIS — E785 Hyperlipidemia, unspecified: Secondary | ICD-10-CM

## 2018-02-19 DIAGNOSIS — E1169 Type 2 diabetes mellitus with other specified complication: Secondary | ICD-10-CM

## 2018-02-19 DIAGNOSIS — K219 Gastro-esophageal reflux disease without esophagitis: Secondary | ICD-10-CM

## 2018-02-19 LAB — BAYER DCA HB A1C WAIVED: HB A1C (BAYER DCA - WAIVED): 7.8 % — ABNORMAL HIGH (ref <7.0)

## 2018-02-20 ENCOUNTER — Ambulatory Visit (INDEPENDENT_AMBULATORY_CARE_PROVIDER_SITE_OTHER): Payer: Medicare Other

## 2018-02-20 ENCOUNTER — Encounter: Payer: Self-pay | Admitting: Family Medicine

## 2018-02-20 ENCOUNTER — Ambulatory Visit (INDEPENDENT_AMBULATORY_CARE_PROVIDER_SITE_OTHER): Payer: Medicare Other | Admitting: Family Medicine

## 2018-02-20 VITALS — BP 115/66 | HR 86 | Temp 97.5°F | Ht 68.0 in | Wt 225.0 lb

## 2018-02-20 DIAGNOSIS — K317 Polyp of stomach and duodenum: Secondary | ICD-10-CM

## 2018-02-20 DIAGNOSIS — E1159 Type 2 diabetes mellitus with other circulatory complications: Secondary | ICD-10-CM | POA: Diagnosis not present

## 2018-02-20 DIAGNOSIS — E785 Hyperlipidemia, unspecified: Secondary | ICD-10-CM

## 2018-02-20 DIAGNOSIS — E1169 Type 2 diabetes mellitus with other specified complication: Secondary | ICD-10-CM | POA: Diagnosis not present

## 2018-02-20 DIAGNOSIS — R7989 Other specified abnormal findings of blood chemistry: Secondary | ICD-10-CM | POA: Diagnosis not present

## 2018-02-20 DIAGNOSIS — R710 Precipitous drop in hematocrit: Secondary | ICD-10-CM | POA: Diagnosis not present

## 2018-02-20 DIAGNOSIS — K219 Gastro-esophageal reflux disease without esophagitis: Secondary | ICD-10-CM | POA: Diagnosis not present

## 2018-02-20 DIAGNOSIS — I2510 Atherosclerotic heart disease of native coronary artery without angina pectoris: Secondary | ICD-10-CM | POA: Diagnosis not present

## 2018-02-20 DIAGNOSIS — N400 Enlarged prostate without lower urinary tract symptoms: Secondary | ICD-10-CM

## 2018-02-20 LAB — CBC WITH DIFFERENTIAL/PLATELET
Basophils Absolute: 0.2 10*3/uL (ref 0.0–0.2)
Basos: 2 %
EOS (ABSOLUTE): 0.7 10*3/uL — AB (ref 0.0–0.4)
Eos: 7 %
Hematocrit: 37.8 % (ref 37.5–51.0)
Hemoglobin: 10.7 g/dL — ABNORMAL LOW (ref 13.0–17.7)
IMMATURE GRANULOCYTES: 0 %
Immature Grans (Abs): 0 10*3/uL (ref 0.0–0.1)
LYMPHS ABS: 1.8 10*3/uL (ref 0.7–3.1)
Lymphs: 20 %
MCH: 19.1 pg — ABNORMAL LOW (ref 26.6–33.0)
MCHC: 28.3 g/dL — AB (ref 31.5–35.7)
MCV: 68 fL — AB (ref 79–97)
MONOS ABS: 0.7 10*3/uL (ref 0.1–0.9)
Monocytes: 7 %
Neutrophils Absolute: 5.7 10*3/uL (ref 1.4–7.0)
Neutrophils: 64 %
PLATELETS: 415 10*3/uL (ref 150–450)
RBC: 5.59 x10E6/uL (ref 4.14–5.80)
RDW: 19.3 % — AB (ref 12.3–15.4)
WBC: 9 10*3/uL (ref 3.4–10.8)

## 2018-02-20 LAB — LIPID PANEL
CHOL/HDL RATIO: 2.1 ratio (ref 0.0–5.0)
Cholesterol, Total: 78 mg/dL — ABNORMAL LOW (ref 100–199)
HDL: 38 mg/dL — AB (ref >39)
LDL Calculated: 9 mg/dL (ref 0–99)
Triglycerides: 155 mg/dL — ABNORMAL HIGH (ref 0–149)
VLDL Cholesterol Cal: 31 mg/dL (ref 5–40)

## 2018-02-20 LAB — HEPATIC FUNCTION PANEL
ALBUMIN: 4 g/dL (ref 3.5–4.8)
ALT: 17 IU/L (ref 0–44)
AST: 16 IU/L (ref 0–40)
Alkaline Phosphatase: 55 IU/L (ref 39–117)
BILIRUBIN TOTAL: 0.3 mg/dL (ref 0.0–1.2)
BILIRUBIN, DIRECT: 0.12 mg/dL (ref 0.00–0.40)
Total Protein: 6.4 g/dL (ref 6.0–8.5)

## 2018-02-20 LAB — BMP8+EGFR
BUN / CREAT RATIO: 15 (ref 10–24)
BUN: 15 mg/dL (ref 8–27)
CO2: 20 mmol/L (ref 20–29)
Calcium: 8.9 mg/dL (ref 8.6–10.2)
Chloride: 98 mmol/L (ref 96–106)
Creatinine, Ser: 0.98 mg/dL (ref 0.76–1.27)
GFR calc Af Amer: 87 mL/min/{1.73_m2} (ref >59)
GFR calc non Af Amer: 76 mL/min/{1.73_m2} (ref >59)
Glucose: 161 mg/dL — ABNORMAL HIGH (ref 65–99)
POTASSIUM: 3.8 mmol/L (ref 3.5–5.2)
SODIUM: 138 mmol/L (ref 134–144)

## 2018-02-20 LAB — VITAMIN D 25 HYDROXY (VIT D DEFICIENCY, FRACTURES): VIT D 25 HYDROXY: 41.4 ng/mL (ref 30.0–100.0)

## 2018-02-20 NOTE — Progress Notes (Signed)
Subjective:    Patient ID: Marvin Frost, male    DOB: 01/19/1946, 72 y.o.   MRN: [id number]  HPI  Pt here for follow up and management of chronic medical problems which includes hyperlipidemia and diabetes. He is taking medication regularly.  The patient continues to have problems with his back and his left knee and is being followed by the orthopedist at [facility] for these.  He will get a chest x-ray today and has had lab work which we will review with him during the visit today.  All liver function tests were normal.  Hemoglobin A1c was 7.8% and lower than it was 4 months ago when it was 8.1%.  The patient understands his goal is to get this down closer to 7.0%.  This is through diet and exercise.  The blood sugar was 161 fasting but much lower than it was 4 months ago.  The creatinine and all of the electrolytes were normal including potassium.  The CBC has a normal white blood cell count.  His hemoglobin is slightly lower than it was 4 months ago when it was 12 and now it is 10.7.  His platelet count was adequate.  Cholesterol numbers were excellent with Repatha with a total cholesterol being 78 and the LDL cholesterol being 9 and I do not think of ever seen one that low.  So he has had an excellent response to the Repatha.  Triglycerides are slightly elevated at 155 and the good cholesterol is low at 38.  The vitamin D level was good at 41.4.  In addition to being followed by the orthopedic surgeons he sees Dr. Tempie Hoist for his eye exams and Dr. Percival Spanish for his cardiology follow-up.  Patient is pleasant smiling and doing well.  He is preparing for injections in his back by the interventional radiologist tomorrow because of ongoing back pain.  He is contemplating in the future possibly doing knee replacements bilaterally.  Unfortunately this is keeping his activity level down.  The patient does follow-up with Dr. Percival Spanish on a yearly basis.  He denies any trouble with chest pain pressure  tightness or shortness of breath.  He denies any trouble with swallowing or heartburn and has been taking ranitidine instead of Protonix and is tolerating the change to ranitidine currently without any heartburn or indigestion.  He has not seen any blood in the stool.  He has nocturia x1 and this is normal for him.    Patient Active Problem List   Diagnosis Date Noted  . Dyslipidemia 09/21/2017  . Coronary artery disease involving native coronary artery of native heart without angina pectoris 09/21/2017  . Gastric polyps 09/18/2017  . Diabetic retinopathy ([place]) 08/01/2017  . 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 Primary osteoarthritis of both knees 01/26/2015  . Nonspecific (abnormal) findings on radiological and other examination of gastrointestinal tract 09/27/2010  . NAUSEA AND VOMITING 08/16/2010  . Type 2 diabetes mellitus not at goal [facility]) 03/18/2009  . HYPERLIPIDEMIA 03/18/2009  . OBESITY 03/18/2009  . Essential hypertension 03/18/2009  . Coronary atherosclerosis 03/18/2009  . [facility] 03/18/2009  . GERD 03/18/2009   Outpatient Encounter Medications as of 02/20/2018  Medication Sig  . aspirin 81 MG tablet Take 81 mg by mouth daily.  Marland Kitchen BENICAR HCT 40-25 MG tablet TAKE 1 TABLET DAILY  . Blood Glucose Monitoring Suppl (FREESTYLE LITE) DEVI Use to check BS bid. DX. E11.9  . cetirizine (ZYRTEC) 10 MG tablet Take 10 mg by mouth as needed.   Marland Kitchen  Cholecalciferol (VITAMIN D3) 5000 UNITS TABS Take 1 tablet by mouth daily.   . Empagliflozin-metFORMIN HCl (SYNJARDY) 12.10-998 MG TABS Take 1 tablet by mouth 2 (two) times daily.  Marland Kitchen FeFum-FePoly-FA-B Cmp-C-Biot (INTEGRA PLUS) CAPS Take 1 capsule by mouth daily.  . ferrous sulfate 325 (65 FE) MG tablet Take 1 tablet (325 mg total) by mouth every other day.  Marland Kitchen glucose blood (FREESTYLE LITE) test strip USE TO CHECK BLOOD SUGAR TWICE A DAY AND AS NEEDED  . Icosapent Ethyl (VASCEPA) 1 g CAPS Take 2 capsules by mouth 2 (two) times daily.  . insulin aspart  (NOVOLOG FLEXPEN) 100 UNIT/ML FlexPen INJECT 16 TO 20 UNITS UNDER THE SKIN TWICE A DAY  . Insulin Degludec (TRESIBA FLEXTOUCH) 200 UNIT/ML SOPN Inject 60-70 Units into the skin daily.  . Insulin Pen Needle (NOVOTWIST) 32G X 5 MM MISC USE WITH INSULIN PENS UP TO 5 TIMES DAILY  . Lancets (FREESTYLE) lancets Check BS BID and PRN. DX.E11.9  . Multiple Vitamin (MULTIVITAMIN) capsule Take 1 capsule by mouth daily after supper.   . pantoprazole (PROTONIX) 40 MG tablet TAKE 1 TABLET TWICE A DAY  . REPATHA SURECLICK 147 MG/ML SOAJ INJECT 140 MG UNDER THE SKIN EVERY 14 DAYS  . rosuvastatin (CRESTOR) 10 MG tablet TAKE 1 TABLET EVERY 3 DAYS   No facility-administered encounter medications on file as of 02/20/2018.      Review of Systems  Constitutional: Negative.   HENT: Negative.   Eyes: Negative.   Respiratory: Negative.   Cardiovascular: Negative.   Gastrointestinal: Negative.   Endocrine: Negative.   Genitourinary: Negative.   Musculoskeletal: Positive for arthralgias (left knee pain - Dr Alvan Dame) and back pain (following Dr Nelva Bush and Dr Nelia Shi).  Skin: Negative.   Allergic/Immunologic: Negative.   Neurological: Negative.   Hematological: Negative.   Psychiatric/Behavioral: Negative.        Objective:   Physical Exam  Constitutional: He is oriented to person, place, and time. He appears well-developed and well-nourished. No distress.  The patient is pleasant and doing well.  HENT:  Head: Normocephalic and atraumatic.  Right Ear: External ear normal.  Left Ear: External ear normal.  Nose: Nose normal.  Mouth/Throat: Oropharynx is clear and moist. No oropharyngeal exudate.  Eyes: Pupils are equal, round, and reactive to light. Conjunctivae and EOM are normal. Right eye exhibits no discharge. Left eye exhibits no discharge. No scleral icterus.  He sees Dr. Carolynn Sayers regularly and does have a follow-up visit with him soon and he will remind him to send [place] a note so we can scanned this into his  record here.  Neck: Normal range of motion. Neck supple. No tracheal deviation present. No thyromegaly present.  No bruits thyromegaly or anterior cervical adenopathy  Cardiovascular: Normal rate, regular rhythm, normal heart sounds and intact distal pulses.  No murmur heard. The heart is regular at 72/min without murmurs with good pedal pulses  Pulmonary/Chest: Effort normal and breath sounds normal. He has no wheezes. He has no rales. He exhibits no tenderness.  Clear anteriorly and posteriorly no axillary adenopathy or chest wall masses.  Abdominal: Soft. Bowel sounds are normal. He exhibits no mass. There is no tenderness.  Abdominal obesity without masses tenderness organ enlargement bruits or inguinal adenopathy.  Musculoskeletal: Normal range of motion. He exhibits tenderness. He exhibits no edema.  Bilateral knee stiffness and pain and limited movement with back due to ongoing osteoarthritic issues.  Lymphadenopathy:    He has no cervical adenopathy.  Neurological: He  is alert and oriented to person, place, and time. He has normal reflexes. No cranial nerve deficit.  Lower extremity reflexes were diminished bilaterally  Skin: Skin is warm and dry. No rash noted.  Psychiatric: He has a normal mood and affect. His behavior is normal. Judgment and thought content normal.  The patient's mood affect and behavior were normal for him.  Nursing note and vitals reviewed.  BP 115/66 (BP Location: Left Arm)   Pulse 86   Temp (!) 97.5 F (36.4 C) (Oral)   Ht 5\' 8"  (1.727 m)   Wt 225 lb (102.1 kg)   BMI 34.21 kg/m         Assessment & Plan:  1. Type 2 diabetes mellitus with other circulatory complications (HCC) -Continue to make all efforts to cope control blood sugar as much as possible through diet exercise and weight loss - DG Chest 2 View; Future  2. Hyperlipidemia associated with type 2 diabetes mellitus ([place]) -All cholesterol numbers were excellent and patient will continue  with Repatha - DG Chest 2 View; Future  3. Low serum vitamin D - continue with vitamin D replacement  4. Gastroesophageal reflux disease, esophagitis presence not specified -Continue with  ranitidine.  150 mg twice daily before eating - DG Chest 2 View; Future  5. Benign prostatic hyperplasia, unspecified whether lower urinary tract symptoms present -Only complains with nocturia and no burning.  This is normal for this patient.  6. Gastroesophageal reflux disease without esophagitis -Return in FOBT because of previous gastric polyps and GI bleeds.  7. Multiple gastric polyps -Return in FOBT -Repeat CBC in the next 3 to 4 weeks or when he comes to visit with the clinical pharmacist.  If the hemoglobin continues to drop he will need to be referred back to the gastroenterologist, Dr. Carlean Purl.  8. Decreased hemoglobin -FOBT -Repeat CBC in the next 3 to 4 weeks. -If hemoglobin drops more an FOBT is positive will need referral back to gastroenterologist, Dr. Carlean Purl.  Patient Instructions                       Medicare Annual Wellness Visit  [facility] and the medical providers at [facility] strive to bring you the best medical care.  In doing so we not only want to address your current medical conditions and concerns but also to detect new conditions early and prevent illness, disease and health-related problems.    Medicare offers a yearly Wellness Visit which allows our clinical staff to assess your need for preventative services including immunizations, lifestyle education, counseling to decrease risk of preventable diseases and screening for fall risk and other medical concerns.    This visit is provided free of charge (no copay) for all Medicare recipients. The clinical pharmacists at [facility] have begun to conduct these Wellness Visits which will also include a thorough review of all your medications.    As you primary medical  provider recommend that you make an appointment for your Annual Wellness Visit if you have not done so already this year.  You may set up this appointment before you leave today or you may call back ([phone]) and schedule an appointment.  Please make sure when you call that you mention that you are scheduling your Annual Wellness Visit with the clinical pharmacist so that the appointment may be made for the proper length of time.     Continue current medications. Continue good therapeutic lifestyle  changes which include good diet and exercise. Fall precautions discussed with patient. If an FOBT was given today- please return it to our front desk. If you are over 80 years old - you may need Prevnar 45 or the adult Pneumonia vaccine.  **Flu shots are available--- please call and schedule a FLU-CLINIC appointment**  After your visit with [place] today you will receive a survey in the mail or online from [employer] regarding your care with [place]. Please take a moment to fill this out. Your feedback is very important to [place] as you can help [place] better understand your patient needs as well as improve your experience and satisfaction. WE CARE ABOUT YOU!!!   Follow-up with orthopedics as planned Follow-up with Dr. Percival Spanish as planned Repeat the CBC in 4 weeks and return the FOBT given today. Make an appointment for follow-up with clinical pharmacy to discuss blood sugar control. Stay active physically and drink plenty of water  Arrie Senate MD

## 2018-02-20 NOTE — Patient Instructions (Addendum)
Medicare Annual Wellness Visit  Marvin Frost and the medical providers at [facility] strive to bring you the best medical care.  In doing so we not only want to address your current medical conditions and concerns but also to detect new conditions early and prevent illness, disease and health-related problems.    Medicare offers a yearly Wellness Visit which allows our clinical staff to assess your need for preventative services including immunizations, lifestyle education, counseling to decrease risk of preventable diseases and screening for fall risk and other medical concerns.    This visit is provided free of charge (no copay) for all Medicare recipients. The clinical pharmacists at [facility] have begun to conduct these Wellness Visits which will also include a thorough review of all your medications.    As you primary medical provider recommend that you make an appointment for your Annual Wellness Visit if you have not done so already this year.  You may set up this appointment before you leave today or you may call back ([phone]) and schedule an appointment.  Please make sure when you call that you mention that you are scheduling your Annual Wellness Visit with the clinical pharmacist so that the appointment may be made for the proper length of time.     Continue current medications. Continue good therapeutic lifestyle changes which include good diet and exercise. Fall precautions discussed with patient. If an FOBT was given today- please return it to our front desk. If you are over 74 years old - you may need Prevnar 25 or the adult Pneumonia vaccine.  **Flu shots are available--- please call and schedule a FLU-CLINIC appointment**  After your visit with [place] today you will receive a survey in the mail or online from [employer] regarding your care with [place]. Please take a moment to fill this out. Your feedback is very  important to [place] as you can help [place] better understand your patient needs as well as improve your experience and satisfaction. WE CARE ABOUT YOU!!!   Follow-up with orthopedics as planned Follow-up with Dr. Percival Spanish as planned Repeat the CBC in 4 weeks and return the FOBT given today. Make an appointment for follow-up with clinical pharmacy to discuss blood sugar control. Stay active physically and drink plenty of water

## 2018-02-21 ENCOUNTER — Ambulatory Visit
Admission: RE | Admit: 2018-02-21 | Discharge: 2018-02-21 | Disposition: A | Discharge status: Home / Self Care | Payer: Medicare Other | Source: Ambulatory Visit | Attending: Physical Medicine and Rehabilitation | Admitting: Physical Medicine and Rehabilitation

## 2018-02-21 ENCOUNTER — Other Ambulatory Visit: Payer: Medicare Other

## 2018-02-21 DIAGNOSIS — M545 Low back pain: Secondary | ICD-10-CM | POA: Diagnosis not present

## 2018-02-21 DIAGNOSIS — Z1212 Encounter for screening for malignant neoplasm of rectum: Secondary | ICD-10-CM | POA: Diagnosis not present

## 2018-02-21 DIAGNOSIS — M5136 Other intervertebral disc degeneration, lumbar region: Secondary | ICD-10-CM

## 2018-02-21 MED ORDER — IOPAMIDOL (ISOVUE-M 200) INJECTION 41%
1.0000 mL | Freq: Once | INTRAMUSCULAR | Status: AC
  Administered 2018-02-21: 1 mL via EPIDURAL

## 2018-02-21 MED ORDER — METHYLPREDNISOLONE ACETATE 40 MG/ML INJ SUSP (RADIOLOG
120.0000 mg | Freq: Once | INTRAMUSCULAR | Status: AC
  Administered 2018-02-21: 120 mg via EPIDURAL

## 2018-02-21 NOTE — Discharge Instructions (Signed)

## 2018-02-23 LAB — FECAL OCCULT BLOOD, IMMUNOCHEMICAL: Fecal Occult Bld: NEGATIVE

## 2018-03-04 DIAGNOSIS — Z961 Presence of intraocular lens: Secondary | ICD-10-CM | POA: Diagnosis not present

## 2018-03-04 DIAGNOSIS — H26491 Other secondary cataract, right eye: Secondary | ICD-10-CM | POA: Diagnosis not present

## 2018-03-13 ENCOUNTER — Ambulatory Visit (INDEPENDENT_AMBULATORY_CARE_PROVIDER_SITE_OTHER): Payer: Medicare Other | Admitting: Pharmacist Clinician (PhC)/ Clinical Pharmacy Specialist

## 2018-03-13 ENCOUNTER — Encounter: Payer: Self-pay | Admitting: Pharmacist Clinician (PhC)/ Clinical Pharmacy Specialist

## 2018-03-13 DIAGNOSIS — E785 Hyperlipidemia, unspecified: Secondary | ICD-10-CM

## 2018-03-13 DIAGNOSIS — I2510 Atherosclerotic heart disease of native coronary artery without angina pectoris: Secondary | ICD-10-CM

## 2018-03-13 DIAGNOSIS — E1169 Type 2 diabetes mellitus with other specified complication: Secondary | ICD-10-CM

## 2018-03-13 DIAGNOSIS — E1159 Type 2 diabetes mellitus with other circulatory complications: Secondary | ICD-10-CM | POA: Diagnosis not present

## 2018-03-13 MED ORDER — FREESTYLE LANCETS MISC: 3 refills | Status: DC

## 2018-03-13 NOTE — Progress Notes (Signed)
Patient comes in today for follow up diabetes visit.  He has major changes in diet since our last visit cutting back on fried foods, sweets, and carbohydrates.  He has seen a decrease in his A1C with these changes.  He did try to leave off Synjardy with a slight increase in glucose levels.  He has had several spinal epidural injections which increased his glucose considerably.  He did increase insulin when he had each injection.  Patient is not exercising due to time back pain.  A/P:  1.  Type 2 diabetes:  Continue 72 units of Tesiba and 22 units of Novolog.  Continue to work on dietary changes I reviewed with him today, ie increasing protein intake after breakfast.  Decrease snacking at night.  Try to start back walking with success of last epidural with Dr. Jarvis Newcomer.  2.  LDL-C was 9mg /dL patient is taking Repatha and Crestor 10mg .  Cut back to crestor 5mg  every three days.  Re-check lipids in 8 weeks.    3.  Would like to stop Synjardy and just do metformin 1000mg  bid.  Patient will stop Synjardy for 5 days and call with glucose readings.  Total time with patient is 45 minutes Memory Argue, PharmD, CPP CLS

## 2018-03-20 ENCOUNTER — Telehealth: Payer: Self-pay | Admitting: Pharmacist Clinician (PhC)/ Clinical Pharmacy Specialist

## 2018-03-21 ENCOUNTER — Other Ambulatory Visit: Payer: Self-pay | Admitting: Family Medicine

## 2018-03-27 ENCOUNTER — Ambulatory Visit (INDEPENDENT_AMBULATORY_CARE_PROVIDER_SITE_OTHER): Payer: Medicare Other | Admitting: Family Medicine

## 2018-03-27 ENCOUNTER — Encounter: Payer: Self-pay | Admitting: Family Medicine

## 2018-03-27 ENCOUNTER — Ambulatory Visit (INDEPENDENT_AMBULATORY_CARE_PROVIDER_SITE_OTHER): Payer: Medicare Other

## 2018-03-27 VITALS — BP 129/71 | HR 94 | Temp 97.5°F | Ht 68.0 in | Wt 228.0 lb

## 2018-03-27 DIAGNOSIS — S82892A Other fracture of left lower leg, initial encounter for closed fracture: Secondary | ICD-10-CM | POA: Diagnosis not present

## 2018-03-27 DIAGNOSIS — M25572 Pain in left ankle and joints of left foot: Secondary | ICD-10-CM | POA: Diagnosis not present

## 2018-03-27 DIAGNOSIS — I2510 Atherosclerotic heart disease of native coronary artery without angina pectoris: Secondary | ICD-10-CM | POA: Diagnosis not present

## 2018-03-27 DIAGNOSIS — S8262XA Displaced fracture of lateral malleolus of left fibula, initial encounter for closed fracture: Secondary | ICD-10-CM | POA: Diagnosis not present

## 2018-03-27 MED ORDER — DICLOFENAC SODIUM 75 MG PO TBEC: 75.0000 mg | DELAYED_RELEASE_TABLET | Freq: Two times a day (BID) | ORAL | 0 refills | Status: DC

## 2018-03-27 NOTE — Progress Notes (Signed)
Chief Complaint  Patient presents with  . Ankle Pain    Left. Turned over 1 month ago    HPI  Patient presents today for pain at lateral left ankle. Rolled it inward 1 month ago. No sx noted until 3-4 days ago. Now he is having moderate swelling. Minimal pain except it throbs with activity. Tender anteriorly.  PMH: Smoking status noted ROS: Per HPI  Objective: BP 129/71   Pulse 94   Temp (!) 97.5 F (36.4 C) (Oral)   Ht 5\' 8"  (1.727 m)   Wt 228 lb (103.4 kg)   BMI 34.67 kg/m  Gen: NAD, alert, cooperative with exam HEENT: NCAT, EOMI, PERRL CV: RRR, good S1/S2, no murmur Resp: CTABL, no wheezes, non-labored Ext: No edema, warm. Tender at anterolateral left ankle with 2+ edema. Joint is stable to drawer and inversion/eversion. Neuro: Alert and oriented, No gross deficits XR - tiny chip avulsion at tip of fibula. Assessment and plan:  1. Closed fracture of left ankle, initial encounter   2. Acute left ankle pain     Meds ordered this encounter  Medications  . diclofenac (VOLTAREN) 75 MG EC tablet    Sig: Take 1 tablet (75 mg total) by mouth 2 (two) times daily.    Dispense:  30 tablet    Refill:  0    Orders Placed This Encounter  Procedures  . DG Ankle Complete Left    Standing Status:   Future    Number of Occurrences:   1    Standing Expiration Date:   05/27/2019    Order Specific Question:   Reason for Exam (SYMPTOM  OR DIAGNOSIS REQUIRED)    Answer:   ankle pain    Order Specific Question:   Preferred imaging location?    Answer:   Internal   Patient to wear ASO brace for 4 weeks.  Prescription given Follow up as needed.  [name], MD

## 2018-04-09 ENCOUNTER — Ambulatory Visit: Payer: Medicare Other

## 2018-04-16 ENCOUNTER — Ambulatory Visit (INDEPENDENT_AMBULATORY_CARE_PROVIDER_SITE_OTHER): Payer: Medicare Other

## 2018-04-16 DIAGNOSIS — Z23 Encounter for immunization: Secondary | ICD-10-CM | POA: Diagnosis not present

## 2018-04-26 ENCOUNTER — Other Ambulatory Visit: Payer: Self-pay | Admitting: Family Medicine

## 2018-05-06 ENCOUNTER — Other Ambulatory Visit: Payer: Self-pay | Admitting: Dermatology

## 2018-05-06 DIAGNOSIS — L080 Pyoderma: Secondary | ICD-10-CM | POA: Diagnosis not present

## 2018-05-06 DIAGNOSIS — L570 Actinic keratosis: Secondary | ICD-10-CM | POA: Diagnosis not present

## 2018-05-06 DIAGNOSIS — D485 Neoplasm of uncertain behavior of skin: Secondary | ICD-10-CM | POA: Diagnosis not present

## 2018-05-06 DIAGNOSIS — D0439 Carcinoma in situ of skin of other parts of face: Secondary | ICD-10-CM | POA: Diagnosis not present

## 2018-05-06 DIAGNOSIS — D044 Carcinoma in situ of skin of scalp and neck: Secondary | ICD-10-CM | POA: Diagnosis not present

## 2018-05-09 ENCOUNTER — Encounter (INDEPENDENT_AMBULATORY_CARE_PROVIDER_SITE_OTHER): Payer: Medicare Other | Admitting: Ophthalmology

## 2018-05-09 DIAGNOSIS — H353112 Nonexudative age-related macular degeneration, right eye, intermediate dry stage: Secondary | ICD-10-CM

## 2018-05-09 DIAGNOSIS — E11319 Type 2 diabetes mellitus with unspecified diabetic retinopathy without macular edema: Secondary | ICD-10-CM | POA: Diagnosis not present

## 2018-05-09 DIAGNOSIS — E113293 Type 2 diabetes mellitus with mild nonproliferative diabetic retinopathy without macular edema, bilateral: Secondary | ICD-10-CM

## 2018-05-09 DIAGNOSIS — H35033 Hypertensive retinopathy, bilateral: Secondary | ICD-10-CM | POA: Diagnosis not present

## 2018-05-09 DIAGNOSIS — I10 Essential (primary) hypertension: Secondary | ICD-10-CM | POA: Diagnosis not present

## 2018-05-09 DIAGNOSIS — H43813 Vitreous degeneration, bilateral: Secondary | ICD-10-CM | POA: Diagnosis not present

## 2018-05-26 NOTE — Progress Notes (Signed)
HPI  The patient presents for follow up of nonobstructive CAD.  His last cath was 2004. His last stress test in July of 2018.  Since I last saw him he has chest pain.  He had a severe episode in October that lasted almost all day.  He did not tell anybody about this.  Has had a little bit since then off and on.  However, since Tuesday he has had progressive chest discomfort.  It is under his left side.  There is some radiation around to his left shoulder blade.  He has had arm discomfort and a little discomfort in his jaw.  It is been coming on with activity.  It is also been happening at rest.  He noticed it today when he was driving up here for this scheduled appointment.  He would not let his wife call an ambulance over the past few days.  Today while sitting in the lobby he had a severe episode with nausea.  This was the first time he felt like this.  He has been having some increased shortness of breath.  He says this is not like his previous GI pain.  He does not recall having these types of symptoms before.  He had nonobstructive disease in the past.  Of note he did have some black stools recently.  He has had gastric polyps treated by Dr. Carlean Purl.  Allergies  Allergen Reactions  . Liraglutide Other (See Comments)    Abdominal pain and enlarged pancreas on CT ? pancreatitis  . Baxinets [Cetylpyridinium-Benzocaine]     unknown  . Celebrex [Celecoxib] Nausea Only  . Toprol Xl [Metoprolol Succinate] Other (See Comments)    Unknown   . Triplex Ad Other (See Comments)    Abdominal pain and enlarged pancreas on CT? pancreatitis  . Welchol [Colesevelam Hcl] Other (See Comments)    constipation  . Norvasc [Amlodipine Besylate] Rash  . Statins Other (See Comments)    Unknown- muscle and joint pain if taken for long periods of time, takes Crestor every 2-3 days to avoid this reaction    Current Outpatient Medications  Medication Sig Dispense Refill  . aspirin 81 MG tablet Take 81 mg by  mouth daily.    Marland Kitchen BENICAR HCT 40-25 MG tablet TAKE 1 TABLET DAILY 90 tablet 1  . Blood Glucose Monitoring Suppl (FREESTYLE LITE) DEVI Use to check BS bid. DX. E11.9 1 each 0  . cetirizine (ZYRTEC) 10 MG tablet Take 10 mg by mouth as needed.     . diclofenac (VOLTAREN) 75 MG EC tablet Take 1 tablet (75 mg total) by mouth 2 (two) times daily. 30 tablet 0  . Empagliflozin-metFORMIN HCl (SYNJARDY) 12.10-998 MG TABS Take 1 tablet by mouth 2 (two) times daily. 180 tablet 3  . FeFum-FePoly-FA-B Cmp-C-Biot (INTEGRA PLUS) CAPS Take 1 capsule by mouth daily. 90 capsule 1  . ferrous sulfate 325 (65 FE) MG tablet Take 1 tablet (325 mg total) by mouth every other day.  3  . glucose blood (FREESTYLE LITE) test strip USE TO CHECK BLOOD SUGAR TWICE A DAY AND AS NEEDED 300 each 3  . Icosapent Ethyl (VASCEPA) 1 g CAPS Take 2 capsules by mouth 2 (two) times daily. 360 capsule 0  . insulin aspart (NOVOLOG FLEXPEN) 100 UNIT/ML FlexPen INJECT 16 TO 20 UNITS UNDER THE SKIN TWICE A DAY 45 mL 3  . Insulin Degludec (TRESIBA FLEXTOUCH) 200 UNIT/ML SOPN Inject 60-70 Units into the skin daily. 60 mL 3  .  Insulin Pen Needle (NOVOTWIST) 32G X 5 MM MISC USE WITH INSULIN PENS UP TO 5 TIMES DAILY 300 each 9  . Lancets (FREESTYLE) lancets Check BS BID and PRN. DX.E11.9 300 each 3  . pantoprazole (PROTONIX) 40 MG tablet TAKE 1 TABLET TWICE A DAY 180 tablet 0  . REPATHA SURECLICK 694 MG/ML SOAJ INJECT 140 MG UNDER THE SKIN EVERY 14 DAYS 6 mL 4  . rosuvastatin (CRESTOR) 10 MG tablet TAKE 1 TABLET EVERY 3 DAYS 90 tablet 1   No current facility-administered medications for this visit.     Past Medical History:  Diagnosis Date  . Allergy   . Arthritis   . CAD (coronary artery disease)    Nonobstructive Cath 2004  . 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 Cataract   . Diabetes mellitus   . Dysrhythmia    history of heart palpitations   . Gastric polyps 09/18/2017  . GERD (gastroesophageal reflux  disease)   . History of frequent urinary tract infections   . History of hiatal hernia   . Hyperlipemia   . Hypertension   . Obesity   . Pancreatitis     Past Surgical History:  Procedure Laterality Date  . CARDIAC CATHETERIZATION     2004 and 1999- done by Dr Percival Spanish   . COLONOSCOPY  11/2003   diverticulosis, no polyps  . EYE SURGERY Bilateral    cateracts - dr Katy Fitch  . HYDROCELE EXCISION / REPAIR    . left calf surgery      related to trauma of being torn   . left index finger arthrioscopic surgery     . lymph node removed from right neck     . right shoulder surgery    . TOTAL HIP ARTHROPLASTY Right 03/16/2015   Procedure: RIGHT TOTAL HIP ARTHROPLASTY ANTERIOR APPROACH;  Surgeon: Paralee Cancel, MD;  Location: [facility];  Service: Orthopedics;  Laterality: Right;  . TOTAL HIP ARTHROPLASTY Left 06/22/2015   Procedure: LEFT TOTAL HIP ARTHROPLASTY ANTERIOR APPROACH;  Surgeon: Paralee Cancel, MD;  Location: [facility];  Service: Orthopedics;  Laterality: Left;  . UPPER GASTROINTESTINAL ENDOSCOPY  08/19/2010   hiatal hernia 2cm, mild gastritis   Family History  Problem Relation Age of Onset  . Heart disease Mother   . Diabetes Brother   . Heart disease Brother   . Heart attack Brother 36  . Heart attack Father 68  . GI problems Sister        torn esophagus during endoscopy  . Healthy Daughter   . Colon polyps Neg Hx   . Esophageal cancer Neg Hx   . Rectal cancer Neg Hx   . Stomach cancer Neg Hx    Social History   Socioeconomic History  . Marital status: Married    Spouse name: Not on file  . Number of children: 1  . Years of education: Not on file  . Highest education level: Not on file  Occupational History  . Occupation: Best boy: [employer]    Comment: retired  Scientific laboratory technician  . Financial resource strain: Not hard at all  . Food insecurity:    Worry: Never true    Inability: Never true  . Transportation needs:    Medical: No    Non-medical: No    Tobacco Use  . Smoking status: Former Smoker    Packs/day: 1.00    Years: 12.00    Pack  years: 12.00    Types: Cigarettes    Last attempt to quit: 02/05/1984    Years since quitting: 34.3  . Smokeless tobacco: Former Systems developer    Types: [place] date: 06/26/1978  Substance and Sexual Activity  . Alcohol use: No  . Drug use: No  . Sexual activity: Yes  Lifestyle  . Physical activity:    Days per week: 5 days    Minutes per session: 30 min  . Stress: Not at all  Relationships  . Social connections:    Talks on phone: More than three times a week    Gets together: More than three times a week    Attends religious service: More than 4 times per year    Active member of club or organization: Yes    Attends meetings of clubs or organizations: More than 4 times per year    Relationship status: Married  . Intimate partner violence:    Fear of current or ex partner: No    Emotionally abused: No    Physically abused: No    Forced sexual activity: No  Other Topics Concern  . Not on file  Social History Narrative   Married   Retired from [employer]   1 child   Former but no current tobacco   No EtOH/drugs    ROS:     As stated in the HPI and negative for all other systems.  PHYSICAL EXAM BP 139/81   Pulse 96   Ht 5\' 8"  (1.727 m)   Wt 225 lb 6.4 oz (102.2 kg)   BMI 34.27 kg/m   GENERAL:  Well appearing HEENT:  Pupils equal round and reactive, fundi not visualized, oral mucosa unremarkable NECK:  No jugular venous distention, waveform within normal limits, carotid upstroke brisk and symmetric, no bruits, no thyromegaly LYMPHATICS:  No cervical, inguinal adenopathy LUNGS:  Clear to auscultation bilaterally BACK:  No CVA tenderness CHEST:  Unremarkable HEART:  PMI not displaced or sustained,S1 and S2 within normal limits, no S3, no S4, no clicks, no rubs, no murmurs ABD:  Flat, positive bowel sounds normal in frequency in pitch, no bruits, no rebound, no guarding, no  midline pulsatile mass, no hepatomegaly, no splenomegaly EXT:  2 plus pulses throughout, no edema, no cyanosis no clubbing SKIN:  No rashes no nodules NEURO:  Cranial nerves II through XII grossly intact, motor grossly intact throughout PSYCH:  Cognitively intact, oriented to person place and time   Lab Results  Component Value Date   CHOL 78 (L) 02/19/2018   TRIG 155 (H) 02/19/2018   HDL 38 (L) 02/19/2018   LDLCALC 9 02/19/2018   LDLDIRECT 39 11/02/2016   Lab Results  Component Value Date   HGBA1C 7.6 05/27/2015    EKG: Normal sinus rhythm, rate 96, left axis deviation, intervals within normal limits, no acute ST-T wave changes.05/27/2018   ASSESSMENT AND PLAN  CHEST PAIN-  Patient has symptoms consistent with unstable angina.  He is being transported emergently to [facility] where he will be admitted.  He will be treated with IV heparin with initial plans for cardiac catheterization pending labs as below.  This could be done electively if his pain is controlled.  There are no acute EKG changes. The patient understands that risks included but are not limited to stroke (1 in 1000), death (1 in 20), kidney failure [usually temporary] (1 in 500), bleeding (1 in 200), allergic reaction [possibly serious] (1 in 200).  The  patient understands and agrees to proceed.   GI BLEED - The patient does describe some black stools.  I would recommend stool guaiac as well as routine labs to include CBC.  Is to be done prior to cardiac catheterization to exclude a possible GI etiology given the recent gastric polyps.  HYPERLIPIDEMIA -  His lipids are excellent.  We will continue with current therapy.   HYPERTENSION -  The blood pressure is controlled.  He will continue meds as listed.   DM -  His A1C was as above.  7.5.  He will continue meds as listed.

## 2018-05-27 ENCOUNTER — Emergency Department (HOSPITAL_COMMUNITY): Payer: Medicare Other

## 2018-05-27 ENCOUNTER — Encounter: Payer: Self-pay | Admitting: Cardiology

## 2018-05-27 ENCOUNTER — Ambulatory Visit (INDEPENDENT_AMBULATORY_CARE_PROVIDER_SITE_OTHER): Payer: Medicare Other | Admitting: Cardiology

## 2018-05-27 ENCOUNTER — Inpatient Hospital Stay (HOSPITAL_COMMUNITY)
Admission: EM | Admit: 2018-05-27 | Discharge: 2018-05-29 | DRG: 247 | Disposition: A | Discharge status: Home / Self Care | Payer: Medicare Other | Attending: Cardiology | Admitting: Cardiology

## 2018-05-27 VITALS — BP 139/81 | HR 96 | Ht 68.0 in | Wt 225.4 lb

## 2018-05-27 DIAGNOSIS — I2510 Atherosclerotic heart disease of native coronary artery without angina pectoris: Secondary | ICD-10-CM | POA: Diagnosis present

## 2018-05-27 DIAGNOSIS — E785 Hyperlipidemia, unspecified: Secondary | ICD-10-CM | POA: Diagnosis present

## 2018-05-27 DIAGNOSIS — R0602 Shortness of breath: Secondary | ICD-10-CM | POA: Diagnosis not present

## 2018-05-27 DIAGNOSIS — Z87891 Personal history of nicotine dependence: Secondary | ICD-10-CM

## 2018-05-27 DIAGNOSIS — I25110 Atherosclerotic heart disease of native coronary artery with unstable angina pectoris: Secondary | ICD-10-CM | POA: Diagnosis present

## 2018-05-27 DIAGNOSIS — I10 Essential (primary) hypertension: Secondary | ICD-10-CM | POA: Diagnosis present

## 2018-05-27 DIAGNOSIS — R079 Chest pain, unspecified: Secondary | ICD-10-CM | POA: Diagnosis not present

## 2018-05-27 DIAGNOSIS — Z9841 Cataract extraction status, right eye: Secondary | ICD-10-CM | POA: Diagnosis not present

## 2018-05-27 DIAGNOSIS — Z955 Presence of coronary angioplasty implant and graft: Secondary | ICD-10-CM

## 2018-05-27 DIAGNOSIS — E119 Type 2 diabetes mellitus without complications: Secondary | ICD-10-CM | POA: Diagnosis not present

## 2018-05-27 DIAGNOSIS — Z9842 Cataract extraction status, left eye: Secondary | ICD-10-CM

## 2018-05-27 DIAGNOSIS — Z833 Family history of diabetes mellitus: Secondary | ICD-10-CM | POA: Diagnosis not present

## 2018-05-27 DIAGNOSIS — Z888 Allergy status to other drugs, medicaments and biological substances status: Secondary | ICD-10-CM | POA: Diagnosis not present

## 2018-05-27 DIAGNOSIS — R0789 Other chest pain: Secondary | ICD-10-CM | POA: Diagnosis not present

## 2018-05-27 DIAGNOSIS — E876 Hypokalemia: Secondary | ICD-10-CM | POA: Diagnosis not present

## 2018-05-27 DIAGNOSIS — K921 Melena: Secondary | ICD-10-CM | POA: Diagnosis present

## 2018-05-27 DIAGNOSIS — I200 Unstable angina: Secondary | ICD-10-CM | POA: Insufficient documentation

## 2018-05-27 DIAGNOSIS — K219 Gastro-esophageal reflux disease without esophagitis: Secondary | ICD-10-CM | POA: Diagnosis present

## 2018-05-27 DIAGNOSIS — Z794 Long term (current) use of insulin: Secondary | ICD-10-CM

## 2018-05-27 DIAGNOSIS — Z8249 Family history of ischemic heart disease and other diseases of the circulatory system: Secondary | ICD-10-CM

## 2018-05-27 DIAGNOSIS — E11319 Type 2 diabetes mellitus with unspecified diabetic retinopathy without macular edema: Secondary | ICD-10-CM | POA: Diagnosis present

## 2018-05-27 DIAGNOSIS — Z7982 Long term (current) use of aspirin: Secondary | ICD-10-CM

## 2018-05-27 DIAGNOSIS — Z96643 Presence of artificial hip joint, bilateral: Secondary | ICD-10-CM | POA: Diagnosis present

## 2018-05-27 DIAGNOSIS — Z79899 Other long term (current) drug therapy: Secondary | ICD-10-CM | POA: Diagnosis not present

## 2018-05-27 DIAGNOSIS — M1990 Unspecified osteoarthritis, unspecified site: Secondary | ICD-10-CM | POA: Diagnosis not present

## 2018-05-27 DIAGNOSIS — I201 Angina pectoris with documented spasm: Secondary | ICD-10-CM | POA: Diagnosis not present

## 2018-05-27 DIAGNOSIS — Z9861 Coronary angioplasty status: Secondary | ICD-10-CM

## 2018-05-27 LAB — I-STAT CHEM 8, ED
BUN: 21 mg/dL (ref 8–23)
CHLORIDE: 106 mmol/L (ref 98–111)
Calcium, Ion: 1.05 mmol/L — ABNORMAL LOW (ref 1.15–1.40)
Creatinine, Ser: 1.1 mg/dL (ref 0.61–1.24)
Glucose, Bld: 103 mg/dL — ABNORMAL HIGH (ref 70–99)
HCT: 33 % — ABNORMAL LOW (ref 39.0–52.0)
Hemoglobin: 11.2 g/dL — ABNORMAL LOW (ref 13.0–17.0)
POTASSIUM: 3.5 mmol/L (ref 3.5–5.1)
SODIUM: 138 mmol/L (ref 135–145)
TCO2: 25 mmol/L (ref 22–32)

## 2018-05-27 LAB — BASIC METABOLIC PANEL
Anion gap: 16 — ABNORMAL HIGH (ref 5–15)
BUN: 22 mg/dL (ref 8–23)
CHLORIDE: 101 mmol/L (ref 98–111)
CO2: 20 mmol/L — ABNORMAL LOW (ref 22–32)
Calcium: 8.7 mg/dL — ABNORMAL LOW (ref 8.9–10.3)
Creatinine, Ser: 1.09 mg/dL (ref 0.61–1.24)
GFR calc non Af Amer: >60 mL/min (ref >60)
Glucose, Bld: 86 mg/dL (ref 70–99)
POTASSIUM: 3.6 mmol/L (ref 3.5–5.1)
SODIUM: 137 mmol/L (ref 135–145)

## 2018-05-27 LAB — TROPONIN I: Troponin I: <0.03 ng/mL (ref <0.03)

## 2018-05-27 LAB — I-STAT TROPONIN, ED: TROPONIN I, POC: 0 ng/mL (ref 0.00–0.08)

## 2018-05-27 LAB — CBC
HCT: 35.4 % — ABNORMAL LOW (ref 39.0–52.0)
Hemoglobin: 9.2 g/dL — ABNORMAL LOW (ref 13.0–17.0)
MCH: 18.5 pg — ABNORMAL LOW (ref 26.0–34.0)
MCHC: 26 g/dL — ABNORMAL LOW (ref 30.0–36.0)
MCV: 71.1 fL — ABNORMAL LOW (ref 80.0–100.0)
NRBC: 0 % (ref 0.0–0.2)
PLATELETS: 358 10*3/uL (ref 150–400)
RBC: 4.98 MIL/uL (ref 4.22–5.81)
RDW: 24.4 % — ABNORMAL HIGH (ref 11.5–15.5)
WBC: 11.5 10*3/uL — ABNORMAL HIGH (ref 4.0–10.5)

## 2018-05-27 LAB — OCCULT BLOOD X 1 CARD TO LAB, STOOL: Fecal Occult Bld: NEGATIVE

## 2018-05-27 LAB — GLUCOSE, CAPILLARY: Glucose-Capillary: 171 mg/dL — ABNORMAL HIGH (ref 70–99)

## 2018-05-27 LAB — PROTIME-INR
INR: 1.01
Prothrombin Time: 13.2 seconds (ref 11.4–15.2)

## 2018-05-27 LAB — BRAIN NATRIURETIC PEPTIDE: B NATRIURETIC PEPTIDE 5: 9.6 pg/mL (ref 0.0–100.0)

## 2018-05-27 MED ORDER — FERROUS SULFATE 325 (65 FE) MG PO TABS: 325.0000 mg | ORAL_TABLET | ORAL | Status: DC

## 2018-05-27 MED ORDER — PANTOPRAZOLE SODIUM 40 MG PO TBEC
40.0000 mg | DELAYED_RELEASE_TABLET | Freq: Two times a day (BID) | ORAL | Status: DC
  Administered 2018-05-27 – 2018-05-29 (×4): 40 mg via ORAL
  Filled 2018-05-27 (×4): qty 1

## 2018-05-27 MED ORDER — ICOSAPENT ETHYL 1 G PO CAPS: 2.0000 | ORAL_CAPSULE | Freq: Two times a day (BID) | ORAL | Status: DC

## 2018-05-27 MED ORDER — HEPARIN (PORCINE) 25000 UT/250ML-% IV SOLN
1400.0000 [IU]/h | INTRAVENOUS | Status: DC
  Administered 2018-05-27: 1200 [IU]/h via INTRAVENOUS
  Filled 2018-05-27: qty 250

## 2018-05-27 MED ORDER — NITROGLYCERIN IN D5W 200-5 MCG/ML-% IV SOLN: 0.0000 ug/min | INTRAVENOUS | Status: DC

## 2018-05-27 MED ORDER — INSULIN ASPART 100 UNIT/ML ~~LOC~~ SOLN
0.0000 [IU] | Freq: Three times a day (TID) | SUBCUTANEOUS | Status: DC
  Administered 2018-05-28 (×2): 2 [IU] via SUBCUTANEOUS
  Administered 2018-05-29 (×2): 3 [IU] via SUBCUTANEOUS

## 2018-05-27 MED ORDER — ASPIRIN EC 81 MG PO TBEC
81.0000 mg | DELAYED_RELEASE_TABLET | Freq: Every day | ORAL | Status: DC
  Administered 2018-05-27 – 2018-05-29 (×3): 81 mg via ORAL
  Filled 2018-05-27 (×3): qty 1

## 2018-05-27 MED ORDER — ROSUVASTATIN CALCIUM 5 MG PO TABS
10.0000 mg | ORAL_TABLET | Freq: Every day | ORAL | Status: DC
  Administered 2018-05-28: 10 mg via ORAL
  Filled 2018-05-27: qty 2

## 2018-05-27 NOTE — ED Notes (Signed)
Pt denies cp at this time

## 2018-05-27 NOTE — ED Provider Notes (Signed)
[facility] EMERGENCY DEPARTMENT Provider Note   CSN: [id number] Arrival date & time: 05/27/18  1657     History   Chief Complaint Chief Complaint  Patient presents with  . Chest Pain    HPI Marvin Frost is a 72 y.o. male. with past medical history of nonobstructive CAD, HTN, DM type II, hyper lipidemia, sent to ED from Dr. Dillard Cannon (cardiologist) office due to unstable angina.   He reports 1 week history of pressure like chest pain with intensitiy of 8/10 on lower chest radiated to his left arm and neck. Also sometimes he felt the pain at lower side of left shoulder blade. The duration has been different from 15 min to 16 h.  Resting and Tylenol made it better. He says activity sometimes made the pain worse. Since past few days the pain got more frequent and occurred at least once a day.  It was associated with diaphoresis and nausea some times. He has had DOE since last month with some swelling on his feet that improved by resting. Patient also endorses having melena as well as dizziness for last 2-3 weeks. He did not have any abdominal pain. He underwent endoscopy on May for similar symptoms.   Having chest pain this last week, he called his cardiologist and got an appointment for today. He had an episode of pain in the office, with no acute ST-T changes on EKG, he was then sent to the ED for unstable angina work up, admission and IV heparin with initial plans for cardiac catheterization.   He received 325 mg ASA and NG x 2 in the ambulance with some pain relief. On arrival, he still has mild pain (1/10). HD stable. EKG no acute ST-T changes.          Past Medical History:  Diagnosis Date  . Allergy   . Arthritis   . CAD (coronary artery disease)    Nonobstructive Cath 2004  . 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 Cataract   . Diabetes mellitus   . Dysrhythmia    history of heart palpitations   . Gastric polyps 09/18/2017  .  GERD (gastroesophageal reflux disease)   . History of frequent urinary tract infections   . History of hiatal hernia   . Hyperlipemia   . Hypertension   . Obesity   . Pancreatitis     Patient Active Problem List   Diagnosis Date Noted  . Unstable angina ([place]) 05/27/2018  . Dyslipidemia 09/21/2017  . Coronary artery disease involving native coronary artery of native heart without angina pectoris 09/21/2017  . Gastric polyps 09/18/2017  . Diabetic retinopathy ([place]) 08/01/2017  . 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 Primary osteoarthritis of both knees 01/26/2015  . Nonspecific (abnormal) findings on radiological and other examination of gastrointestinal tract 09/27/2010  . NAUSEA AND VOMITING 08/16/2010  . Type 2 diabetes mellitus not at goal [facility]) 03/18/2009  . HYPERLIPIDEMIA 03/18/2009  . OBESITY 03/18/2009  . Essential hypertension 03/18/2009  . Coronary atherosclerosis 03/18/2009  . [facility] 03/18/2009  . GERD 03/18/2009    Past Surgical History:  Procedure Laterality Date  . CARDIAC CATHETERIZATION     2004 and 1999- done by Dr Percival Spanish   . COLONOSCOPY  11/2003   diverticulosis, no polyps  . EYE SURGERY Bilateral    cateracts - dr Katy Fitch  . HYDROCELE EXCISION / REPAIR    . left calf surgery  related to trauma of being torn   . left index finger arthrioscopic surgery     . lymph node removed from right neck     . right shoulder surgery    . TOTAL HIP ARTHROPLASTY Right 03/16/2015   Procedure: RIGHT TOTAL HIP ARTHROPLASTY ANTERIOR APPROACH;  Surgeon: Paralee Cancel, MD;  Location: [facility];  Service: Orthopedics;  Laterality: Right;  . TOTAL HIP ARTHROPLASTY Left 06/22/2015   Procedure: LEFT TOTAL HIP ARTHROPLASTY ANTERIOR APPROACH;  Surgeon: Paralee Cancel, MD;  Location: [facility];  Service: Orthopedics;  Laterality: Left;  . UPPER GASTROINTESTINAL ENDOSCOPY  08/19/2010   hiatal hernia 2cm, mild gastritis        Home Medications    Prior to Admission medications   Medication  Sig Start Date End Date Taking? Authorizing Provider  aspirin 81 MG tablet Take 81 mg by mouth daily.    [provider]  BENICAR HCT 40-25 MG tablet TAKE 1 TABLET DAILY 03/21/18   Chipper Herb, MD  Blood Glucose Monitoring Suppl (FREESTYLE LITE) DEVI Use to check BS bid. DX. E11.9 07/29/14   Chipper Herb, MD  cetirizine (ZYRTEC) 10 MG tablet Take 10 mg by mouth as needed.     [provider]  diclofenac (VOLTAREN) 75 MG EC tablet Take 1 tablet (75 mg total) by mouth 2 (two) times daily. 03/27/18   [name], MD  Empagliflozin-metFORMIN HCl (SYNJARDY) 12.10-998 MG TABS Take 1 tablet by mouth 2 (two) times daily. 10/15/17   Chipper Herb, MD  FeFum-FePoly-FA-B Cmp-C-Biot (INTEGRA PLUS) CAPS Take 1 capsule by mouth daily. 06/14/17   Chipper Herb, MD  ferrous sulfate 325 (65 FE) MG tablet Take 1 tablet (325 mg total) by mouth every other day. 11/03/16   Cherre Robins, PharmD  glucose blood (FREESTYLE LITE) test strip USE TO CHECK BLOOD SUGAR TWICE A DAY AND AS NEEDED 08/04/16   Chipper Herb, MD  Icosapent Ethyl (VASCEPA) 1 g CAPS Take 2 capsules by mouth 2 (two) times daily. 06/13/17   Chipper Herb, MD  insulin aspart (NOVOLOG FLEXPEN) 100 UNIT/ML FlexPen INJECT 16 TO 20 UNITS UNDER THE SKIN TWICE A DAY 10/15/17   Chipper Herb, MD  Insulin Degludec (TRESIBA FLEXTOUCH) 200 UNIT/ML SOPN Inject 60-70 Units into the skin daily. 10/15/17   Chipper Herb, MD  Insulin Pen Needle (NOVOTWIST) 32G X 5 MM MISC USE WITH INSULIN PENS UP TO 5 TIMES DAILY 01/16/18   Chipper Herb, MD  Lancets (FREESTYLE) lancets Check BS BID and PRN. DX.E11.9 03/13/18   Memory Argue, PharmD  pantoprazole (PROTONIX) 40 MG tablet TAKE 1 TABLET TWICE A DAY 04/26/18   Chipper Herb, MD  REPATHA SURECLICK 161 MG/ML SOAJ INJECT 140 MG UNDER THE SKIN EVERY 14 DAYS 06/27/17   Chipper Herb, MD  rosuvastatin (CRESTOR) 10 MG tablet TAKE 1 TABLET EVERY 3 DAYS 10/29/17   Chipper Herb, MD    Family  History Family History  Problem Relation Age of Onset  . Heart disease Mother   . Diabetes Brother   . Heart disease Brother   . Heart attack Brother 40  . Heart attack Father 44  . GI problems Sister        torn esophagus during endoscopy  . Healthy Daughter   . Colon polyps Neg Hx   . Esophageal cancer Neg Hx   . Rectal cancer Neg Hx   . Stomach cancer Neg Hx  Social History Social History   Tobacco Use  . Smoking status: Former Smoker    Packs/day: 1.00    Years: 12.00    Pack years: 12.00    Types: Cigarettes    Last attempt to quit: 02/05/1984    Years since quitting: 34.3  . Smokeless tobacco: Former Systems developer    Types: [place] date: 06/26/1978  Substance Use Topics  . Alcohol use: No  . Drug use: No     Allergies   Liraglutide; Baxinets [cetylpyridinium-benzocaine]; Celebrex [celecoxib]; Toprol xl [metoprolol succinate]; Triplex ad; Welchol [colesevelam hcl]; Norvasc [amlodipine besylate]; and Statins   Review of Systems Review of Systems   Physical Exam Updated Vital Signs BP 137/67 (BP Location: Right Arm)   Pulse 91   Temp 98.2 F (36.8 C) (Oral)   Resp 18   SpO2 95%   Physical Exam  Constitutional: He is oriented to person, place, and time. He appears well-developed and well-nourished. He does not appear ill. No distress.  HENT:  Head: Normocephalic and atraumatic.  Eyes: EOM are normal.  Cardiovascular: Normal rate, regular rhythm and normal pulses.  No murmur heard. Pulmonary/Chest: Effort normal and breath sounds normal.  Abdominal: Soft. Bowel sounds are normal. There is no tenderness.  Musculoskeletal:       Right lower leg: He exhibits no tenderness and no edema.       Left lower leg: He exhibits no tenderness and no edema.  Neurological: He is alert and oriented to person, place, and time.  Skin: Capillary refill takes less than 2 seconds.  Psychiatric: He has a normal mood and affect. His behavior is normal.     ED Treatments  / Results  Labs (all labs ordered are listed, but only abnormal results are displayed) Labs Reviewed  BASIC METABOLIC PANEL  CBC  BRAIN NATRIURETIC PEPTIDE  CBG MONITORING, ED  I-STAT TROPONIN, ED  I-STAT CHEM 8, ED    EKG EKG Interpretation  Date/Time:  Monday May 27 2018 17:08:25 EST Ventricular Rate:  87 PR Interval:    QRS Duration: 93 QT Interval:  395 QTC Calculation: 476 R Axis:   -59 Text Interpretation:  Sinus rhythm Inferior infarct, old No significant change since last tracing Confirmed by Deno Etienne [phone]) on 05/27/2018 5:10:35 PM   Radiology No results found.  Procedures Procedures (including critical care time)  Medications Ordered in ED Medications - No data to display   Initial Impression / Assessment and Plan / ED Course  I have reviewed the triage vital signs and the nursing notes.  Pertinent labs & imaging results that were available during my care of the patient were reviewed by me and considered in my medical decision making (see chart for details).     Sent to ED from cardiologist, Dr. Dillard Cannon, office due to unstable angina.   presented with 1 week Hx of on and off Pressure like chest pain with intensity of 8/10 on lower chest radiated to his left arm and neck and on lower side of left shoulder blade, associated with nausea and diaphoresis. Some times got worse with activity, also associated with palpitation.   Last episode occurred with he was in cardiology office today and he was sent to ED for unstable angina.  EKG with no acute ST-T changes. Low risk for PE with Wells criteria. S/p 325 mg ASA and 2 NG in the ambulance, still has mild chest pain.  -I stat Trop -Nitro drip until pain free -BNP -CXR -  Will start Hep drip after having Hb result  He has had some DOE in past month. No orthopnea. But reports bilateral feet swelling that improved with resting and beeping flat. No sign of volume overload on exam.   -CXR -BNP  Also  reports melena and dizziness in past 2-3 weeks, not pale on exam. No abdominal tenderness. If low Hb, chest pain due to demand ischemia can be considered. -istat-chem -CBC  Update: -I stat trop: 0 -istat-chem-8--> Hb 12 Consult cardiology and appreciate their recommendation for Heparin and further plan   Final Clinical Impressions(s) / ED Diagnoses   Final diagnoses:  None    ED Discharge Orders    None       Dewayne Hatch, MD 05/27/18 1931    Deno Etienne, DO 05/27/18 2031    Deno Etienne, DO 05/27/18 2031    Deno Etienne, DO 05/27/18 2353

## 2018-05-27 NOTE — ED Triage Notes (Signed)
Pt arrived from cardiology office after Md directed pt to ED, stating pt had unstable angina and needed to be seen. No tx started at office. EMS gave 2SL ntg and 324mg  ASA PTA. Pt states he has had angina x1 week. Pt is alert and oriented x4. Family at bedside.

## 2018-05-27 NOTE — H&P (Addendum)
Cardiology Admission History and Physical:   Patient ID: Marvin Frost MRN: [id number]; DOB: [birth year]   Admission date: 05/27/2018  Primary Care Provider: Chipper Herb, MD Primary Cardiologist: Minus Breeding, MD  Primary Electrophysiologist:  None   Chief Complaint:  Chest Pain   Patient Profile:   Marvin Frost is a 72 y.o. male with known CAD sent from clinic today by Marvin Frost to the ED for admission for CP concerning for unstable angina.   History of Present Illness:   Marvin Frost has h/o CAD. He had a cath in 2004 that showed nonobstructive disease. He had a stress test in July 2018 that showed no ischemia. Also has HTN, HLD and DM. He was seen in clinic today by Marvin Frost and endorsed severe SSCP, occuring off and on. Symptoms worsening over the last several days, initially exertional but now occurring at rest. Left sided CP w/ radiation to his left shoulder blade, left arm and jaw. Also associated dyspnea and nausea. He does have a h/o GERD but symptoms not like his prior GI pain. Of note, pt has had some recent black stools and has h/o gastric polyps treated by Dr. Carlean Purl.   Marvin Frost recommended pt come to the ED for admission, FOBT to r/o GIB, IV heparin and possible cath if FOBT negative.   In ED, VSS. Labs pending. EKG shows no acute changes. CBC shows Hgb at 11. Was 10 in August. Pt notes he had a BM earlier today and stool looked normal. He is currently CP free.    Past Medical History:  Diagnosis Date  . Allergy   . Arthritis   . CAD (coronary artery disease)    Nonobstructive Cath 2004  . 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 Cataract   . Diabetes mellitus   . Dysrhythmia    history of heart palpitations   . Gastric polyps 09/18/2017  . GERD (gastroesophageal reflux disease)   . History of frequent urinary tract infections   . History of hiatal hernia   . Hyperlipemia   . Hypertension   . Obesity   . Pancreatitis      Past Surgical History:  Procedure Laterality Date  . CARDIAC CATHETERIZATION     2004 and 1999- done by Dr Percival Frost   . COLONOSCOPY  11/2003   diverticulosis, no polyps  . EYE SURGERY Bilateral    cateracts - dr Katy Fitch  . HYDROCELE EXCISION / REPAIR    . left calf surgery      related to trauma of being torn   . left index finger arthrioscopic surgery     . lymph node removed from right neck     . right shoulder surgery    . TOTAL HIP ARTHROPLASTY Right 03/16/2015   Procedure: RIGHT TOTAL HIP ARTHROPLASTY ANTERIOR APPROACH;  Surgeon: Paralee Cancel, MD;  Location: [facility];  Service: Orthopedics;  Laterality: Right;  . TOTAL HIP ARTHROPLASTY Left 06/22/2015   Procedure: LEFT TOTAL HIP ARTHROPLASTY ANTERIOR APPROACH;  Surgeon: Paralee Cancel, MD;  Location: [facility];  Service: Orthopedics;  Laterality: Left;  . UPPER GASTROINTESTINAL ENDOSCOPY  08/19/2010   hiatal hernia 2cm, mild gastritis     Medications Prior to Admission: Prior to Admission medications   Medication Sig Start Date End Date Taking? Authorizing Provider  aspirin 81 MG tablet Take 81 mg by mouth daily.    [provider]  BENICAR HCT 40-25 MG tablet TAKE 1  TABLET DAILY 03/21/18   Marvin Herb, MD  Blood Glucose Monitoring Suppl (FREESTYLE LITE) DEVI Use to check BS bid. DX. E11.9 07/29/14   Marvin Herb, MD  cetirizine (ZYRTEC) 10 MG tablet Take 10 mg by mouth as needed.     [provider]  diclofenac (VOLTAREN) 75 MG EC tablet Take 1 tablet (75 mg total) by mouth 2 (two) times daily. 03/27/18   [name], MD  Empagliflozin-metFORMIN HCl (SYNJARDY) 12.10-998 MG TABS Take 1 tablet by mouth 2 (two) times daily. 10/15/17   Marvin Herb, MD  FeFum-FePoly-FA-B Cmp-C-Biot (INTEGRA PLUS) CAPS Take 1 capsule by mouth daily. 06/14/17   Marvin Herb, MD  ferrous sulfate 325 (65 FE) MG tablet Take 1 tablet (325 mg total) by mouth every other day. 11/03/16   Cherre Robins, PharmD  glucose blood (FREESTYLE  LITE) test strip USE TO CHECK BLOOD SUGAR TWICE A DAY AND AS NEEDED 08/04/16   Marvin Herb, MD  Icosapent Ethyl (VASCEPA) 1 g CAPS Take 2 capsules by mouth 2 (two) times daily. 06/13/17   Marvin Herb, MD  insulin aspart (NOVOLOG FLEXPEN) 100 UNIT/ML FlexPen INJECT 16 TO 20 UNITS UNDER THE SKIN TWICE A DAY 10/15/17   Marvin Herb, MD  Insulin Degludec (TRESIBA FLEXTOUCH) 200 UNIT/ML SOPN Inject 60-70 Units into the skin daily. 10/15/17   Marvin Herb, MD  Insulin Pen Needle (NOVOTWIST) 32G X 5 MM MISC USE WITH INSULIN PENS UP TO 5 TIMES DAILY 01/16/18   Marvin Herb, MD  Lancets (FREESTYLE) lancets Check BS BID and PRN. DX.E11.9 03/13/18   Memory Argue, PharmD  pantoprazole (PROTONIX) 40 MG tablet TAKE 1 TABLET TWICE A DAY 04/26/18   Marvin Herb, MD  REPATHA SURECLICK 166 MG/ML SOAJ INJECT 140 MG UNDER THE SKIN EVERY 14 DAYS 06/27/17   Marvin Herb, MD  rosuvastatin (CRESTOR) 10 MG tablet TAKE 1 TABLET EVERY 3 DAYS 10/29/17   Marvin Herb, MD     Allergies:    Allergies  Allergen Reactions  . Liraglutide Other (See Comments)    Abdominal pain and enlarged pancreas on CT ? pancreatitis  . Baxinets [Cetylpyridinium-Benzocaine]     unknown  . Celebrex [Celecoxib] Nausea Only  . Toprol Xl [Metoprolol Succinate] Other (See Comments)    Unknown   . Triplex Ad Other (See Comments)    Abdominal pain and enlarged pancreas on CT? pancreatitis  . Welchol [Colesevelam Hcl] Other (See Comments)    constipation  . Norvasc [Amlodipine Besylate] Rash  . Statins Other (See Comments)    Unknown- muscle and joint pain if taken for long periods of time, takes Crestor every 2-3 days to avoid this reaction    Social History:   Social History   Socioeconomic History  . Marital status: Married    Spouse name: Not on file  . Number of children: 1  . Years of education: Not on file  . Highest education level: Not on file  Occupational History  . Occupation: Best boy:  [employer]    Comment: retired  Scientific laboratory technician  . Financial resource strain: Not hard at all  . Food insecurity:    Worry: Never true    Inability: Never true  . Transportation needs:    Medical: No    Non-medical: No  Tobacco Use  . Smoking status: Former Smoker    Packs/day: 1.00    Years: 12.00    Pack years:  12.00    Types: Cigarettes    Last attempt to quit: 02/05/1984    Years since quitting: 34.3  . Smokeless tobacco: Former Systems developer    Types: [place] date: 06/26/1978  Substance and Sexual Activity  . Alcohol use: No  . Drug use: No  . Sexual activity: Yes  Lifestyle  . Physical activity:    Days per week: 5 days    Minutes per session: 30 min  . Stress: Not at all  Relationships  . Social connections:    Talks on phone: More than three times a week    Gets together: More than three times a week    Attends religious service: More than 4 times per year    Active member of club or organization: Yes    Attends meetings of clubs or organizations: More than 4 times per year    Relationship status: Married  . Intimate partner violence:    Fear of current or ex partner: No    Emotionally abused: No    Physically abused: No    Forced sexual activity: No  Other Topics Concern  . Not on file  Social History Narrative   Married   Retired from [employer]   1 child   Former but no current tobacco   No EtOH/drugs    Family History:   The patient's family history includes Diabetes in his brother; GI problems in his sister; Healthy in his daughter; Heart attack (age of onset: 6) in his father; Heart attack (age of onset: 49) in his brother; Heart disease in his brother and mother. There is no history of Colon polyps, Esophageal cancer, Rectal cancer, or Stomach cancer.    ROS:  Please see the history of present illness.  All other ROS reviewed and negative.     Physical Exam/Data:   Vitals:   05/27/18 1730 05/27/18 1745 05/27/18 1800 05/27/18 1815  BP:  (!) 127/58 110/63 (!) 104/55 (!) 109/57  Pulse: 84 85 83 84  Resp: 17 14 20 12   Temp:      TempSrc:      SpO2: 96% 95% 98% 95%   No intake or output data in the 24 hours ending 05/27/18 1848 There were no vitals filed for this visit. There is no height or weight on file to calculate BMI.  General:  Well nourished, well developed, in no acute distress HEENT: normal Lymph: no adenopathy Neck: no JVD Endocrine:  No thryomegaly Vascular: No carotid bruits; FA pulses 2+ bilaterally without bruits  Cardiac:  normal S1, S2; RRR; no murmur  Lungs:  clear to auscultation bilaterally, no wheezing, rhonchi or rales  Abd: soft, nontender, no hepatomegaly  Ext: no edema Musculoskeletal:  No deformities, BUE and BLE strength normal and equal Skin: warm and dry  Neuro:  CNs 2-12 intact, no focal abnormalities noted Psych:  Normal affect    EKG:  The ECG that was done 05/27/18 was personally reviewed and demonstrates SR, old inferior infarct  Relevant CV Studies: LHC pending   Laboratory Data:  ChemistryNo results for input(s): NA, K, CL, CO2, GLUCOSE, BUN, CREATININE, CALCIUM, GFRNONAA, GFRAA, ANIONGAP in the last 168 hours.  No results for input(s): PROT, ALBUMIN, AST, ALT, ALKPHOS, BILITOT in the last 168 hours. HematologyNo results for input(s): WBC, RBC, HGB, HCT, MCV, MCH, MCHC, RDW, PLT in the last 168 hours. Cardiac EnzymesNo results for input(s): TROPONINI in the last 168 hours. No results for input(s): TROPIPOC in the  last 168 hours.  BNPNo results for input(s): BNP, PROBNP in the last 168 hours.  DDimer No results for input(s): DDIMER in the last 168 hours.  Radiology/Studies:  No results found.  Assessment and Plan:   Marvin Frost is a 72 y.o. male with known CAD sent from clinic today by Marvin Frost to the ED for admission for CP concerning for unstable angina.   Plan taken from Dr. Rosezella [place] office note today.   CHEST PAIN-  Patient has symptoms consistent with  unstable angina.  He is being transported emergently to [facility] where he will be admitted.  He will be treated with IV heparin with initial plans for cardiac catheterization pending labs as below.  This could be done electively if his pain is controlled.  There are no acute EKG changes. The patient understands that risks included but are not limited to stroke (1 in 1000), death (1 in 54), kidney failure [usually temporary] (1 in 500), bleeding (1 in 200), allergic reaction [possibly serious] (1 in 200).  The patient understands and agrees to proceed.   GI BLEED - The patient does describe some black stools.  I would recommend stool guaiac as well as routine labs to include CBC.  Is to be done prior to cardiac catheterization to exclude a possible GI etiology given the recent gastric polyps.  HYPERLIPIDEMIA -  His lipids are excellent.  We will continue with current therapy.   HYPERTENSION -  The blood pressure is controlled.  He will continue meds as listed.   DM -  His A1C was as above.  7.5.  He will continue meds as listed.    For questions or updates, please contact [place] Please consult [email or web address] for contact info under        Signed, Marvin Jester, PA-C  05/27/2018 6:48 PM

## 2018-05-27 NOTE — ED Notes (Signed)
ED TO INPATIENT HANDOFF REPORT  Name/Age/Gender Marvin Frost 72 y.o. male  Code Status Code Status History    Date Active Date Inactive Code Status Order ID Comments User Context   06/22/2015 1223 06/23/2015 1309 Full Code [id number]  [facility]an Herrlich Inpatient   03/16/2015 1141 03/17/2015 1614 Full Code [id number]  [facility]an Herrlich Inpatient      Home/SNF/Other Home  Chief Complaint Unstable Angina   Level of Care/Admitting Diagnosis ED Disposition    ED Disposition Condition [place] [facility] Area: [place] [[id number]]  Level of Care: Telemetry [5]  Diagnosis: Unstable angina [facility] [[id number]]  Admitting Physician: Minus Breeding [phone]  Attending Physician: Minus Breeding [phone]  Estimated length of stay: past midnight tomorrow  Certification:: I certify this patient will need inpatient services for at least 2 midnights  PT Class (Do Not Modify): Inpatient [101]  PT Acc Code (Do Not Modify): Private [1]       Medical History Past Medical History:  Diagnosis Date  . Allergy   . Arthritis   . CAD (coronary artery disease)    Nonobstructive Cath 2004  . 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 Cataract   . Diabetes mellitus   . Dysrhythmia    history of heart palpitations   . Gastric polyps 09/18/2017  . GERD (gastroesophageal reflux disease)   . History of frequent urinary tract infections   . History of hiatal hernia   . Hyperlipemia   . Hypertension   . Obesity   . Pancreatitis     Allergies Allergies  Allergen Reactions  . Liraglutide Other (See Comments)    Abdominal pain and enlarged pancreas on CT ? pancreatitis  . Baxinets [Cetylpyridinium-Benzocaine] Other (See Comments)    Unknown reaction  . Celebrex [Celecoxib] Nausea Only  . Toprol Xl [Metoprolol Succinate] Other (See Comments)    Unknown reaction - pt states it did not work  . Triplex Ad Other (See Comments)    Abdominal pain and  enlarged pancreas on CT? pancreatitis  . Welchol [Colesevelam Hcl] Other (See Comments)    constipation  . Norvasc [Amlodipine Besylate] Rash  . Statins Other (See Comments)    muscle and joint pain if taken for long periods of time, takes Crestor every 2-3 days to avoid this reaction    IV Location/Drains/Wounds Patient Lines/Drains/Airways Status   Active Line/Drains/Airways    Name:   Placement date:   Placement time:   Site:   Days:   Peripheral IV 05/27/18 Right;Posterior Hand   05/27/18    1748    Hand   less than 1   Incision (Closed) 06/22/15 Hip Left   06/22/15    0951     1070          Labs/Imaging Results for orders placed or performed during the hospital encounter of 05/27/18 (from the past 48 hour(s))  Basic metabolic panel     Status: Abnormal   Collection Time: 05/27/18  5:12 PM  Result Value Ref Range   Sodium 137 135 - 145 mmol/L   Potassium 3.6 3.5 - 5.1 mmol/L   Chloride 101 98 - 111 mmol/L   CO2 20 (L) 22 - 32 mmol/L   Glucose, Bld 86 70 - 99 mg/dL   BUN 22 8 - 23 mg/dL   Creatinine, Ser 1.09 0.61 - 1.24 mg/dL   Calcium 8.7 (L) 8.9 - 10.3 mg/dL  GFR calc non Af Amer >60 >60 mL/min   GFR calc Af Amer >60 >60 mL/min   Anion gap 16 (H) 5 - 15    Comment: Performed at [facility] [address], [place], [place] [address]  CBC     Status: Abnormal   Collection Time: 05/27/18  5:12 PM  Result Value Ref Range   WBC 11.5 (H) 4.0 - 10.5 K/uL   RBC 4.98 4.22 - 5.81 MIL/uL   Hemoglobin 9.2 (L) 13.0 - 17.0 g/dL   HCT 35.4 (L) 39.0 - 52.0 %   MCV 71.1 (L) 80.0 - 100.0 fL   MCH 18.5 (L) 26.0 - 34.0 pg   MCHC 26.0 (L) 30.0 - 36.0 g/dL   RDW 24.4 (H) 11.5 - 15.5 %   Platelets 358 150 - 400 K/uL   nRBC 0.0 0.0 - 0.2 %    Comment: Performed at [facility] [address], [place], [place] [address]  Protime-INR     Status: None   Collection Time: 05/27/18  6:40 PM  Result Value Ref Range   Prothrombin Time 13.2 11.4 - 15.2 seconds   INR 1.01      Comment: Performed at [facility] [address], [place], [place] [address]  I-stat troponin, ED (not at [facility])     Status: None   Collection Time: 05/27/18  6:55 PM  Result Value Ref Range   Troponin i, poc 0.00 0.00 - 0.08 ng/mL   Comment 3            Comment: Due to the release kinetics of cTnI, a negative result within the first hours of the onset of symptoms does not rule out myocardial infarction with certainty. If myocardial infarction is still suspected, repeat the test at appropriate intervals.   I-Stat Chem 8, ED     Status: Abnormal   Collection Time: 05/27/18  6:55 PM  Result Value Ref Range   Sodium 138 135 - 145 mmol/L   Potassium 3.5 3.5 - 5.1 mmol/L   Chloride 106 98 - 111 mmol/L   BUN 21 8 - 23 mg/dL   Creatinine, Ser 1.10 0.61 - 1.24 mg/dL   Glucose, Bld 103 (H) 70 - 99 mg/dL   Calcium, Ion 1.05 (L) 1.15 - 1.40 mmol/L   TCO2 25 22 - 32 mmol/L   Hemoglobin 11.2 (L) 13.0 - 17.0 g/dL   HCT 33.0 (L) 39.0 - 52.0 %   Dg Chest 2 View  Result Date: 05/27/2018 CLINICAL DATA:  Chest pain. EXAM: CHEST - 2 VIEW COMPARISON:  None FINDINGS: Normal heart size. No pleural effusion or edema. No airspace opacities. Visualized osseous structures are unremarkable. IMPRESSION: 1. No active cardiopulmonary abnormalities. Electronically Signed   By: Kerby Moors M.D.   On: 05/27/2018 19:45    Pending Labs Unresulted Labs (From admission, onward)    Start     Ordered   05/28/18 6734  Basic metabolic panel  Tomorrow morning,   R     05/27/18 1953   05/28/18 0500  CBC  Tomorrow morning,   R     05/27/18 1953   05/28/18 0500  Heparin level (unfractionated)  Daily,   R     05/27/18 2032   05/28/18 0500  CBC  Daily,   R     05/27/18 2032   05/27/18 1742  Brain natriuretic peptide  Add-on,   R     05/27/18 1741   Signed and  Held  Occult blood card to lab, stool  ONCE - STAT,   R     Signed and Held   Signed and Held  Troponin I - Now Then Q6H  Now then every 6 hours,    R     Signed and Held          Vitals/Pain Today's Vitals   05/27/18 1745 05/27/18 1800 05/27/18 1815 05/27/18 1900  BP: 110/63 (!) 104/55 (!) 109/57 122/62  Pulse: 85 83 84 80  Resp: 14 20 12 19   Temp:      TempSrc:      SpO2: 95% 98% 95% 97%  PainSc:        Isolation Precautions No active isolations  Medications Medications  nitroGLYCERIN 50 mg in dextrose 5 % 250 mL (0.2 mg/mL) infusion (has no administration in time range)  heparin ADULT infusion 100 units/mL (25000 units/285mL sodium chloride 0.45%) (has no administration in time range)    Mobility walks

## 2018-05-27 NOTE — ED Notes (Signed)
Heart healthy carb modified dinner tray ordered 

## 2018-05-27 NOTE — Progress Notes (Signed)
ANTICOAGULATION CONSULT NOTE - Initial Consult  Pharmacy Consult for heparin Indication: chest pain/ACS  Allergies  Allergen Reactions  . Liraglutide Other (See Comments)    Abdominal pain and enlarged pancreas on CT ? pancreatitis  . Baxinets [Cetylpyridinium-Benzocaine]     unknown  . Celebrex [Celecoxib] Nausea Only  . Toprol Xl [Metoprolol Succinate] Other (See Comments)    Unknown   . Triplex Ad Other (See Comments)    Abdominal pain and enlarged pancreas on CT? pancreatitis  . Welchol [Colesevelam Hcl] Other (See Comments)    constipation  . Norvasc [Amlodipine Besylate] Rash  . Statins Other (See Comments)    Unknown- muscle and joint pain if taken for long periods of time, takes Crestor every 2-3 days to avoid this reaction    Patient Measurements: Ideal Body Weight: 68.4 kg Actual Body Weight:102.2 kg  Heparin Dosing Weight: 91 kg   Vital Signs: Temp: 98.2 F (36.8 C) (12/02 1657) Temp Source: Oral (12/02 1657) BP: 122/62 (12/02 1900) Pulse Rate: 80 (12/02 1900)  Labs: Recent Labs    05/27/18 1855  HGB 11.2*  HCT 33.0*  CREATININE 1.10    Estimated Creatinine Clearance: 68.3 mL/min (by C-G formula based on SCr of 1.1 mg/dL).   Medical History: Past Medical History:  Diagnosis Date  . Allergy   . Arthritis   . CAD (coronary artery disease)    Nonobstructive Cath 2004  . 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 Cataract   . Diabetes mellitus   . Dysrhythmia    history of heart palpitations   . Gastric polyps 09/18/2017  . GERD (gastroesophageal reflux disease)   . History of frequent urinary tract infections   . History of hiatal hernia   . Hyperlipemia   . Hypertension   . Obesity   . Pancreatitis     Medications:   (Not in a hospital admission)  Assessment: 27 YOM with known CAD who presents with CP concerning for unstable angina. Pharmacy consulted to start IV heparin for ACS. Of note, patient described some  black stools in office visit today. H/H reviewed.  Goal of Therapy:  Heparin level 0.3-0.7 units/ml Monitor platelets by anticoagulation protocol: Yes   Plan:  -Start IV heparin at 1200 units/hr. No bolus until FOBT results  -F/u 8 hr HL -F/u plans for [facility]   Albertina Parr, PharmD., BCPS Clinical Pharmacist Clinical phone for 05/27/18 until 11pm: [phone] If after 3:30pm, please refer to [facility] for unit-specific pharmacist

## 2018-05-28 ENCOUNTER — Encounter (HOSPITAL_COMMUNITY): Payer: Self-pay

## 2018-05-28 ENCOUNTER — Inpatient Hospital Stay (HOSPITAL_COMMUNITY)
Admission: EM | Disposition: A | Discharge status: Home / Self Care | Payer: Self-pay | Source: Home / Self Care | Attending: Cardiology

## 2018-05-28 ENCOUNTER — Other Ambulatory Visit: Payer: Self-pay

## 2018-05-28 DIAGNOSIS — E876 Hypokalemia: Secondary | ICD-10-CM

## 2018-05-28 DIAGNOSIS — I25110 Atherosclerotic heart disease of native coronary artery with unstable angina pectoris: Principal | ICD-10-CM

## 2018-05-28 DIAGNOSIS — E119 Type 2 diabetes mellitus without complications: Secondary | ICD-10-CM

## 2018-05-28 HISTORY — PX: CORONARY PRESSURE/FFR STUDY: CATH118243

## 2018-05-28 HISTORY — PX: CORONARY STENT INTERVENTION: CATH118234

## 2018-05-28 HISTORY — PX: LEFT HEART CATH AND CORONARY ANGIOGRAPHY: CATH118249

## 2018-05-28 LAB — BASIC METABOLIC PANEL
Anion gap: 12 (ref 5–15)
BUN: 21 mg/dL (ref 8–23)
CALCIUM: 8.9 mg/dL (ref 8.9–10.3)
CO2: 23 mmol/L (ref 22–32)
Chloride: 102 mmol/L (ref 98–111)
Creatinine, Ser: 1.12 mg/dL (ref 0.61–1.24)
GFR calc Af Amer: >60 mL/min (ref >60)
Glucose, Bld: 154 mg/dL — ABNORMAL HIGH (ref 70–99)
Potassium: 3.4 mmol/L — ABNORMAL LOW (ref 3.5–5.1)
SODIUM: 137 mmol/L (ref 135–145)

## 2018-05-28 LAB — CBC
HCT: 33.6 % — ABNORMAL LOW (ref 39.0–52.0)
Hemoglobin: 9.1 g/dL — ABNORMAL LOW (ref 13.0–17.0)
MCH: 18.7 pg — ABNORMAL LOW (ref 26.0–34.0)
MCHC: 27.1 g/dL — ABNORMAL LOW (ref 30.0–36.0)
MCV: 69 fL — ABNORMAL LOW (ref 80.0–100.0)
Platelets: 320 10*3/uL (ref 150–400)
RBC: 4.87 MIL/uL (ref 4.22–5.81)
RDW: 24.1 % — ABNORMAL HIGH (ref 11.5–15.5)
WBC: 10.8 10*3/uL — ABNORMAL HIGH (ref 4.0–10.5)
nRBC: 0 % (ref 0.0–0.2)

## 2018-05-28 LAB — HEPARIN LEVEL (UNFRACTIONATED)
HEPARIN UNFRACTIONATED: 0.15 [IU]/mL — AB (ref 0.30–0.70)
HEPARIN UNFRACTIONATED: 0.38 [IU]/mL (ref 0.30–0.70)

## 2018-05-28 LAB — GLUCOSE, CAPILLARY
Glucose-Capillary: 142 mg/dL — ABNORMAL HIGH (ref 70–99)
Glucose-Capillary: 139 mg/dL — ABNORMAL HIGH (ref 70–99)
Glucose-Capillary: 109 mg/dL — ABNORMAL HIGH (ref 70–99)
Glucose-Capillary: 184 mg/dL — ABNORMAL HIGH (ref 70–99)

## 2018-05-28 LAB — TROPONIN I
Troponin I: 0.1 ng/mL (ref <0.03)
Troponin I: <0.03 ng/mL (ref <0.03)

## 2018-05-28 LAB — POCT ACTIVATED CLOTTING TIME
Activated Clotting Time: 268 seconds
Activated Clotting Time: 285 seconds

## 2018-05-28 LAB — MRSA PCR SCREENING: MRSA by PCR: NEGATIVE

## 2018-05-28 SURGERY — LEFT HEART CATH AND CORONARY ANGIOGRAPHY
Anesthesia: LOCAL

## 2018-05-28 MED ORDER — TICAGRELOR 90 MG PO TABS
ORAL_TABLET | ORAL | Status: AC
  Filled 2018-05-28: qty 2

## 2018-05-28 MED ORDER — VERAPAMIL HCL 2.5 MG/ML IV SOLN
INTRAVENOUS | Status: AC
  Filled 2018-05-28: qty 2

## 2018-05-28 MED ORDER — LIDOCAINE HCL (PF) 1 % IJ SOLN
INTRAMUSCULAR | Status: DC | PRN
  Administered 2018-05-28: 2 mL

## 2018-05-28 MED ORDER — TICAGRELOR 90 MG PO TABS
ORAL_TABLET | ORAL | Status: DC | PRN
  Administered 2018-05-28: 180 mg via ORAL

## 2018-05-28 MED ORDER — MIDAZOLAM HCL 2 MG/2ML IJ SOLN
INTRAMUSCULAR | Status: AC
  Filled 2018-05-28: qty 2

## 2018-05-28 MED ORDER — SODIUM CHLORIDE 0.9 % IV SOLN: 250.0000 mL | INTRAVENOUS | Status: DC | PRN

## 2018-05-28 MED ORDER — SODIUM CHLORIDE 0.9 % WEIGHT BASED INFUSION: 1.0000 mL/kg/h | INTRAVENOUS | Status: DC

## 2018-05-28 MED ORDER — HEPARIN (PORCINE) IN NACL 1000-0.9 UT/500ML-% IV SOLN
INTRAVENOUS | Status: AC
  Filled 2018-05-28: qty 1000

## 2018-05-28 MED ORDER — HYDRALAZINE HCL 20 MG/ML IJ SOLN: 5.0000 mg | INTRAMUSCULAR | Status: AC | PRN

## 2018-05-28 MED ORDER — SODIUM CHLORIDE 0.9 % WEIGHT BASED INFUSION
1.0000 mL/kg/h | INTRAVENOUS | Status: AC
  Administered 2018-05-28 (×2): 1 mL/kg/h via INTRAVENOUS

## 2018-05-28 MED ORDER — HEPARIN SODIUM (PORCINE) 1000 UNIT/ML IJ SOLN
INTRAMUSCULAR | Status: AC
  Filled 2018-05-28: qty 1

## 2018-05-28 MED ORDER — SODIUM CHLORIDE 0.9 % WEIGHT BASED INFUSION
3.0000 mL/kg/h | INTRAVENOUS | Status: AC
  Administered 2018-05-28: 3 mL/kg/h via INTRAVENOUS

## 2018-05-28 MED ORDER — ACETAMINOPHEN 325 MG PO TABS
650.0000 mg | ORAL_TABLET | ORAL | Status: DC | PRN
  Administered 2018-05-28: 650 mg via ORAL
  Filled 2018-05-28: qty 2

## 2018-05-28 MED ORDER — TICAGRELOR 90 MG PO TABS
90.0000 mg | ORAL_TABLET | Freq: Two times a day (BID) | ORAL | Status: DC
  Administered 2018-05-28 – 2018-05-29 (×2): 90 mg via ORAL
  Filled 2018-05-28 (×2): qty 1

## 2018-05-28 MED ORDER — NITROGLYCERIN 1 MG/10 ML FOR IR/CATH LAB
INTRA_ARTERIAL | Status: DC | PRN
  Administered 2018-05-28: 200 ug via INTRACORONARY

## 2018-05-28 MED ORDER — NITROGLYCERIN IN D5W 200-5 MCG/ML-% IV SOLN
INTRAVENOUS | Status: AC
  Filled 2018-05-28: qty 250

## 2018-05-28 MED ORDER — SODIUM CHLORIDE 0.9% FLUSH: 3.0000 mL | INTRAVENOUS | Status: DC | PRN

## 2018-05-28 MED ORDER — ADENOSINE (DIAGNOSTIC) 140MCG/KG/MIN
INTRAVENOUS | Status: DC | PRN
  Administered 2018-05-28: 140 ug/kg/min via INTRAVENOUS

## 2018-05-28 MED ORDER — SODIUM CHLORIDE 0.9% FLUSH: 3.0000 mL | Freq: Two times a day (BID) | INTRAVENOUS | Status: DC

## 2018-05-28 MED ORDER — ONDANSETRON HCL 4 MG/2ML IJ SOLN: 4.0000 mg | Freq: Four times a day (QID) | INTRAMUSCULAR | Status: DC | PRN

## 2018-05-28 MED ORDER — LIDOCAINE HCL (PF) 1 % IJ SOLN
INTRAMUSCULAR | Status: AC
  Filled 2018-05-28: qty 30

## 2018-05-28 MED ORDER — SODIUM CHLORIDE 0.9% FLUSH
3.0000 mL | Freq: Two times a day (BID) | INTRAVENOUS | Status: DC
  Administered 2018-05-29: 3 mL via INTRAVENOUS

## 2018-05-28 MED ORDER — HEPARIN BOLUS VIA INFUSION
2000.0000 [IU] | Freq: Once | INTRAVENOUS | Status: AC
  Administered 2018-05-28: 2000 [IU] via INTRAVENOUS
  Filled 2018-05-28: qty 2000

## 2018-05-28 MED ORDER — SODIUM CHLORIDE 0.9 % IV SOLN
INTRAVENOUS | Status: AC
  Administered 2018-05-28: 12:00:00 via INTRAVENOUS

## 2018-05-28 MED ORDER — FENTANYL CITRATE (PF) 100 MCG/2ML IJ SOLN
INTRAMUSCULAR | Status: AC
  Filled 2018-05-28: qty 2

## 2018-05-28 MED ORDER — HEPARIN SODIUM (PORCINE) 1000 UNIT/ML IJ SOLN
INTRAMUSCULAR | Status: DC | PRN
  Administered 2018-05-28: 3000 [IU] via INTRAVENOUS
  Administered 2018-05-28 (×2): 5000 [IU] via INTRAVENOUS

## 2018-05-28 MED ORDER — MIDAZOLAM HCL 2 MG/2ML IJ SOLN
INTRAMUSCULAR | Status: DC | PRN
  Administered 2018-05-28: 2 mg via INTRAVENOUS

## 2018-05-28 MED ORDER — ASPIRIN 81 MG PO CHEW: 81.0000 mg | CHEWABLE_TABLET | ORAL | Status: DC

## 2018-05-28 MED ORDER — HEPARIN (PORCINE) IN NACL 1000-0.9 UT/500ML-% IV SOLN
INTRAVENOUS | Status: DC | PRN
  Administered 2018-05-28 (×2): 500 mL

## 2018-05-28 MED ORDER — VERAPAMIL HCL 2.5 MG/ML IV SOLN
INTRAVENOUS | Status: DC | PRN
  Administered 2018-05-28: 14:00:00 via INTRA_ARTERIAL

## 2018-05-28 MED ORDER — ADENOSINE 12 MG/4ML IV SOLN
INTRAVENOUS | Status: AC
  Filled 2018-05-28: qty 16

## 2018-05-28 MED ORDER — POTASSIUM CHLORIDE CRYS ER 20 MEQ PO TBCR
40.0000 meq | EXTENDED_RELEASE_TABLET | Freq: Once | ORAL | Status: AC
  Administered 2018-05-28: 40 meq via ORAL
  Filled 2018-05-28: qty 2

## 2018-05-28 MED ORDER — FENTANYL CITRATE (PF) 100 MCG/2ML IJ SOLN
INTRAMUSCULAR | Status: DC | PRN
  Administered 2018-05-28: 25 ug via INTRAVENOUS

## 2018-05-28 SURGICAL SUPPLY — 18 items
BALLN SAPPHIRE 2.5X15 (BALLOONS) ×2
BALLN SAPPHIRE ~~LOC~~ 2.75X12 (BALLOONS) ×1 IMPLANT
BALLOON SAPPHIRE 2.5X15 (BALLOONS) IMPLANT
CATH 5FR JL3.5 JR4 ANG PIG MP (CATHETERS) ×1 IMPLANT
CATH LAUNCHER 5F EBU3.5 (CATHETERS) ×1 IMPLANT
DEVICE RAD COMP TR BAND LRG (VASCULAR PRODUCTS) ×1 IMPLANT
GLIDESHEATH SLEND SS 6F .021 (SHEATH) ×1 IMPLANT
GUIDEWIRE INQWIRE 1.5J.035X260 (WIRE) IMPLANT
GUIDEWIRE PRESSURE COMET II (WIRE) ×1 IMPLANT
INQWIRE 1.5J .035X260CM (WIRE) ×2
KIT ENCORE 26 ADVANTAGE (KITS) ×1 IMPLANT
KIT HEART LEFT (KITS) ×2 IMPLANT
PACK CARDIAC CATHETERIZATION (CUSTOM PROCEDURE TRAY) ×2 IMPLANT
STENT RESOLUTE ONYX 2.5X22 (Permanent Stent) ×1 IMPLANT
SYR MEDRAD MARK 7 150ML (SYRINGE) ×2 IMPLANT
TRANSDUCER W/STOPCOCK (MISCELLANEOUS) ×2 IMPLANT
TUBING CIL FLEX 10 FLL-RA (TUBING) ×2 IMPLANT
WIRE COUGAR XT STRL 190CM (WIRE) ×1 IMPLANT

## 2018-05-28 NOTE — Progress Notes (Signed)
[facility] for heparin Indication: chest pain/ACS  Allergies  Allergen Reactions  . Liraglutide Other (See Comments)    Abdominal pain and enlarged pancreas on CT ? pancreatitis  . Baxinets [Cetylpyridinium-Benzocaine] Other (See Comments)    Unknown reaction  . Celebrex [Celecoxib] Nausea Only  . Toprol Xl [Metoprolol Succinate] Other (See Comments)    Unknown reaction - pt states it did not work  . Triplex Ad Other (See Comments)    Abdominal pain and enlarged pancreas on CT? pancreatitis  . Welchol [Colesevelam Hcl] Other (See Comments)    constipation  . Norvasc [Amlodipine Besylate] Rash  . Statins Other (See Comments)    muscle and joint pain if taken for long periods of time, takes Crestor every 2-3 days to avoid this reaction    Patient Measurements: Ideal Body Weight: 68.4 kg Actual Body Weight:102.2 kg  Heparin Dosing Weight: 91 kg   Vital Signs: Temp: 98.7 F (37.1 C) (12/02 2140) Temp Source: Oral (12/02 2140) BP: 123/99 (12/02 2140) Pulse Rate: 87 (12/02 2140)  Labs: Recent Labs    05/27/18 1712 05/27/18 1840 05/27/18 1855 05/27/18 2159 05/28/18 0436  HGB 9.2*  --  11.2*  --  9.1*  HCT 35.4*  --  33.0*  --  33.6*  PLT 358  --   --   --  320  LABPROT  --  13.2  --   --   --   INR  --  1.01  --   --   --   HEPARINUNFRC  --   --   --   --  0.15*  CREATININE 1.09  --  1.10  --   --   TROPONINI  --   --   --  <0.03  --     Estimated Creatinine Clearance: 67.8 mL/min (by C-G formula based on SCr of 1.1 mg/dL).  Assessment: 36 YOM with known CAD who presents with CP concerning for unstable angina. Pharmacy consulted to start IV heparin for ACS. Of note, patient described some black stools in office visit today. H/H reviewed. Initial heparin level 0.15 units/ml,  drawn ~ 4.4 hours after heparin started   Goal of Therapy:  Heparin level 0.3-0.7 units/ml Monitor platelets by anticoagulation protocol: Yes   Plan:   -Heparin bolus 2000 units and increase drip to 1400 units/hr -F/u 6 hr HL -F/u plans for [facility]   Thanks for allowing pharmacy to be a part of this patient's care.  Excell Seltzer, PharmD Clinical Pharmacist

## 2018-05-28 NOTE — Progress Notes (Signed)
Progress Note  Patient Name: Marvin Frost Date of Encounter: 05/28/2018  Primary Cardiologist: Minus Breeding, MD   Subjective   Fleeting episodes of chest discomfort, otherwise well. Anticipating coronary angiogram +/- PCI today. Wife Marvin Frost present for discussion.   Inpatient Medications    Scheduled Meds: . aspirin EC  81 mg Oral Daily  . insulin aspart  0-15 Units Subcutaneous TID WC  . pantoprazole  40 mg Oral BID  . rosuvastatin  10 mg Oral q1800   Continuous Infusions: . heparin 1,400 Units/hr (05/28/18 0552)  . nitroGLYCERIN     PRN Meds:    Vital Signs    Vitals:   05/27/18 2030 05/27/18 2140 05/28/18 0554 05/28/18 0821  BP: 126/66 (!) 123/99 (!) 129/58 (!) 117/57  Pulse: 89 87 76   Resp: 16 (!) 24 15   Temp:  98.7 F (37.1 C) 97.7 F (36.5 C) (!) 97.5 F (36.4 C)  TempSrc:  Oral Oral Oral  SpO2: 97% 96% 95%   Weight:  100.7 kg    Height:  5\' 8"  (1.727 m)      Intake/Output Summary (Last 24 hours) at 05/28/2018 0955 Last data filed at 05/28/2018 0554 Gross per 24 hour  Intake 325.95 ml  Output -  Net 325.95 ml   Filed Weights   05/27/18 2140  Weight: 100.7 kg    Telemetry    NSR - Personally Reviewed  ECG    SR, no acute ST T wave changes - Personally Reviewed  Physical Exam   GEN: No acute distress.   Neck: No JVD Cardiac: regular rhythm, normal rate, no murmurs, rubs, or gallops.  Respiratory: Clear to auscultation bilaterally.  GI: Soft, nontender, non-distended  MS: No edema; No deformity. Neuro:  Nonfocal  Psych: Normal affect   Labs    Chemistry Recent Labs  Lab 05/27/18 1712 05/27/18 1855 05/28/18 0436  NA 137 138 137  K 3.6 3.5 3.4*  CL 101 106 102  CO2 20*  --  23  GLUCOSE 86 103* 154*  BUN 22 21 21   CREATININE 1.09 1.10 1.12  CALCIUM 8.7*  --  8.9  GFRNONAA >60  --  >60  GFRAA >60  --  >60  ANIONGAP 16*  --  12     Hematology Recent Labs  Lab 05/27/18 1712 05/27/18 1855 05/28/18 0436  WBC 11.5*  --   10.8*  RBC 4.98  --  4.87  HGB 9.2* 11.2* 9.1*  HCT 35.4* 33.0* 33.6*  MCV 71.1*  --  69.0*  MCH 18.5*  --  18.7*  MCHC 26.0*  --  27.1*  RDW 24.4*  --  24.1*  PLT 358  --  320    Cardiac Enzymes Recent Labs  Lab 05/27/18 2159 05/28/18 0436  TROPONINI <0.03 0.10*    Recent Labs  Lab 05/27/18 1855  TROPIPOC 0.00     BNP Recent Labs  Lab 05/27/18 1800  BNP 9.6     DDimer No results for input(s): DDIMER in the last 168 hours.   Radiology    Dg Chest 2 View  Result Date: 05/27/2018 CLINICAL DATA:  Chest pain. EXAM: CHEST - 2 VIEW COMPARISON:  None FINDINGS: Normal heart size. No pleural effusion or edema. No airspace opacities. Visualized osseous structures are unremarkable. IMPRESSION: 1. No active cardiopulmonary abnormalities. Electronically Signed   By: Kerby Moors M.D.   On: 05/27/2018 19:45    Cardiac Studies   Pending   Patient Profile  Marvin Frost is a 72 y.o. male with known nonobstructive CAD sent from clinic today by Dr. Percival Spanish to the ED for admission for CP concerning for unstable angina.   Assessment & Plan   Active Problems:   Unstable angina ([place])   Will plan for coronary cath today. Had one mildly elevated troponin. On IV heparin. Will given gentle hydration prior to procedure 50 mL/hr for 6 hrs.  Stool guaiac was negative - has been on heparin with no issue.  Currently chest pain free.  Hypokalemia - will replace potassium today.  DM 2 - insulin ordered.  INFORMED CONSENT:  I have reviewed the risks, indications, and alternatives to cardiac catheterization, possible angioplasty, and stenting with the patient. Risks include but are not limited to bleeding, infection, vascular injury, stroke, myocardial infection, arrhythmia, kidney injury, radiation-related injury in the case of prolonged fluoroscopy use, emergency cardiac surgery, and death. The patient understands the risks of serious complication is 1-2 in 9518 with diagnostic  cardiac cath and 1-2% or less with angioplasty/stenting.    For questions or updates, please contact [place] Please consult [email or web address] for contact info under        Signed, Elouise Munroe, MD  05/28/2018, 9:55 AM

## 2018-05-28 NOTE — Consult Note (Signed)
[facility] CM Primary Care Navigator  05/28/2018  Marvin Frost 08/10/1945 [id number]   Attempt to see patient at the bedside to identify possible discharge needs butheis off the unit inthe cath lab for a procedureat this time. (left cardiac catheterization)  Per MD note, patient was admitted for chest pain concerning for unstable angina.   Will attempt to see patient at another time whenheis available in the room.     Addendum (05/29/18):   Went back to see patient at the bedside to identify possible discharge needs and met with wife Marvin Frost) as well.  Patient reportshaving "on and off chest pain" that had led to this admission. (coronary artery disease, unstable angina status post cardiac cath and coronary angioplasty)  Patient endorsesDr. Redge Gainer with [facility] as hisprimary care provider.   [facility] in [place], [place] to obtain medications without any problem.   Patientstates that he has been managing hismedications at [facility] wife's assistance straight out of the containers and using "pillbox" when travelling.  Patientreportsthathe has been driving prior to admission but wife Marvin Frost) can be able to providetransportation tohis doctors' appointments after discharge.  Patientstatesthathe lives with wife who will serve as his primary caregiver at home.  Anticipated discharge plan ishome when ready per patient.  Patient and wife voiced understanding to call primary care provider's office for a post discharge follow-up appointment within a1- 2 weeks orsooner if needs arise. Patient letter (with PCP's contact number) wasprovided as their reminder.  Discussed with patientand wife regarding [facility] CM services available for health management andresourcesat home, but indicatedhaving noneedfor servicesat thispoint.   Patient  verbalized that wife has been helping him manage his health needs and issues so far and are both knowledgeable of ways to maintain and manage it.  Patientand wife verbalizedunderstandingof needto seek referral from primary care provider to [facility] care management ifdeemed necessary and appropriatefor any servicesin thenearfuture.  [facility] care management information was provided for future needs thatpatientmay have.  Patienthowever,verbally agreedand optedforEMMIcalls to follow-up with his recovery at home.   Referral made for [facility] General calls after discharge.   For additional questions please contact:  Edwena Felty A. Fariha Goto, BSN, RN-BC [facility] PRIMARY CARE Navigator Cell: [phone]

## 2018-05-28 NOTE — Interval H&P Note (Signed)
Cath Lab Visit (complete for each Cath Lab visit)  Clinical Evaluation Leading to the Procedure:   ACS: Yes.    Non-ACS:    Anginal Classification: CCS IV  Anti-ischemic medical therapy: Minimal Therapy (1 class of medications)  Non-Invasive Test Results: No non-invasive testing performed  Prior CABG: No previous CABG      History and Physical Interval Note:  05/28/2018 1:28 PM  Marvin Frost  has presented today for surgery, with the diagnosis of cp  The various methods of treatment have been discussed with the patient and family. After consideration of risks, benefits and other options for treatment, the patient has consented to  Procedure(s): LEFT HEART CATH AND CORONARY ANGIOGRAPHY (N/A) as a surgical intervention .  The patient's history has been reviewed, patient examined, no change in status, stable for surgery.  I have reviewed the patient's chart and labs.  Questions were answered to the patient's satisfaction.     Sherren Mocha

## 2018-05-28 NOTE — Progress Notes (Signed)
Pt returned from procedural area.  Conscious,alert and oriented.made comfortable in bed, right radial site checked tr band insitu.

## 2018-05-28 NOTE — Progress Notes (Signed)
Pt left unit for cardiac cath lab on bed accompanied by nurse and relative .

## 2018-05-28 NOTE — H&P (View-Only) (Signed)
Progress Note  Patient Name: Marvin Frost Date of Encounter: 05/28/2018  Primary Cardiologist: Minus Breeding, MD   Subjective   Fleeting episodes of chest discomfort, otherwise well. Anticipating coronary angiogram +/- PCI today. Wife [name] present for discussion.   Inpatient Medications    Scheduled Meds: . aspirin EC  81 mg Oral Daily  . insulin aspart  0-15 Units Subcutaneous TID WC  . pantoprazole  40 mg Oral BID  . rosuvastatin  10 mg Oral q1800   Continuous Infusions: . heparin 1,400 Units/hr (05/28/18 0552)  . nitroGLYCERIN     PRN Meds:    Vital Signs    Vitals:   05/27/18 2030 05/27/18 2140 05/28/18 0554 05/28/18 0821  BP: 126/66 (!) 123/99 (!) 129/58 (!) 117/57  Pulse: 89 87 76   Resp: 16 (!) 24 15   Temp:  98.7 F (37.1 C) 97.7 F (36.5 C) (!) 97.5 F (36.4 C)  TempSrc:  Oral Oral Oral  SpO2: 97% 96% 95%   Weight:  100.7 kg    Height:  5\' 8"  (1.727 m)      Intake/Output Summary (Last 24 hours) at 05/28/2018 0955 Last data filed at 05/28/2018 0554 Gross per 24 hour  Intake 325.95 ml  Output -  Net 325.95 ml   Filed Weights   05/27/18 2140  Weight: 100.7 kg    Telemetry    NSR - Personally Reviewed  ECG    SR, no acute ST T wave changes - Personally Reviewed  Physical Exam   GEN: No acute distress.   Neck: No JVD Cardiac: regular rhythm, normal rate, no murmurs, rubs, or gallops.  Respiratory: Clear to auscultation bilaterally.  GI: Soft, nontender, non-distended  MS: No edema; No deformity. Neuro:  Nonfocal  Psych: Normal affect   Labs    Chemistry Recent Labs  Lab 05/27/18 1712 05/27/18 1855 05/28/18 0436  NA 137 138 137  K 3.6 3.5 3.4*  CL 101 106 102  CO2 20*  --  23  GLUCOSE 86 103* 154*  BUN 22 21 21   CREATININE 1.09 1.10 1.12  CALCIUM 8.7*  --  8.9  GFRNONAA >60  --  >60  GFRAA >60  --  >60  ANIONGAP 16*  --  12     Hematology Recent Labs  Lab 05/27/18 1712 05/27/18 1855 05/28/18 0436  WBC 11.5*  --   10.8*  RBC 4.98  --  4.87  HGB 9.2* 11.2* 9.1*  HCT 35.4* 33.0* 33.6*  MCV 71.1*  --  69.0*  MCH 18.5*  --  18.7*  MCHC 26.0*  --  27.1*  RDW 24.4*  --  24.1*  PLT 358  --  320    Cardiac Enzymes Recent Labs  Lab 05/27/18 2159 05/28/18 0436  TROPONINI <0.03 0.10*    Recent Labs  Lab 05/27/18 1855  TROPIPOC 0.00     BNP Recent Labs  Lab 05/27/18 1800  BNP 9.6     DDimer No results for input(s): DDIMER in the last 168 hours.   Radiology    Dg Chest 2 View  Result Date: 05/27/2018 CLINICAL DATA:  Chest pain. EXAM: CHEST - 2 VIEW COMPARISON:  None FINDINGS: Normal heart size. No pleural effusion or edema. No airspace opacities. Visualized osseous structures are unremarkable. IMPRESSION: 1. No active cardiopulmonary abnormalities. Electronically Signed   By: Kerby Moors M.D.   On: 05/27/2018 19:45    Cardiac Studies   Pending   Patient Profile  Anant Agard is a 72 y.o. male with known nonobstructive CAD sent from clinic today by Dr. Percival Spanish to the ED for admission for CP concerning for unstable angina.   Assessment & Plan   Active Problems:   Unstable angina ([place])   Will plan for coronary cath today. Had one mildly elevated troponin. On IV heparin. Will given gentle hydration prior to procedure 50 mL/hr for 6 hrs.  Stool guaiac was negative - has been on heparin with no issue.  Currently chest pain free.  Hypokalemia - will replace potassium today.  DM 2 - insulin ordered.  INFORMED CONSENT:  I have reviewed the risks, indications, and alternatives to cardiac catheterization, possible angioplasty, and stenting with the patient. Risks include but are not limited to bleeding, infection, vascular injury, stroke, myocardial infection, arrhythmia, kidney injury, radiation-related injury in the case of prolonged fluoroscopy use, emergency cardiac surgery, and death. The patient understands the risks of serious complication is 1-2 in 3893 with diagnostic  cardiac cath and 1-2% or less with angioplasty/stenting.    For questions or updates, please contact [place] Please consult [email or web address] for contact info under        Signed, Elouise Munroe, MD  05/28/2018, 9:55 AM

## 2018-05-29 ENCOUNTER — Telehealth: Payer: Self-pay | Admitting: Cardiology

## 2018-05-29 ENCOUNTER — Telehealth: Payer: Self-pay | Admitting: Family Medicine

## 2018-05-29 ENCOUNTER — Other Ambulatory Visit: Payer: Self-pay | Admitting: Cardiology

## 2018-05-29 DIAGNOSIS — Z9861 Coronary angioplasty status: Secondary | ICD-10-CM

## 2018-05-29 DIAGNOSIS — I2510 Atherosclerotic heart disease of native coronary artery without angina pectoris: Secondary | ICD-10-CM

## 2018-05-29 LAB — CBC
HCT: 34.6 % — ABNORMAL LOW (ref 39.0–52.0)
Hemoglobin: 9.1 g/dL — ABNORMAL LOW (ref 13.0–17.0)
MCH: 18.5 pg — ABNORMAL LOW (ref 26.0–34.0)
MCHC: 26.3 g/dL — ABNORMAL LOW (ref 30.0–36.0)
MCV: 70.3 fL — ABNORMAL LOW (ref 80.0–100.0)
PLATELETS: 327 10*3/uL (ref 150–400)
RBC: 4.92 MIL/uL (ref 4.22–5.81)
RDW: 24.6 % — ABNORMAL HIGH (ref 11.5–15.5)
WBC: 9.6 10*3/uL (ref 4.0–10.5)
nRBC: 0 % (ref 0.0–0.2)

## 2018-05-29 LAB — BASIC METABOLIC PANEL
Anion gap: 14 (ref 5–15)
BUN: 13 mg/dL (ref 8–23)
CALCIUM: 8.8 mg/dL — AB (ref 8.9–10.3)
CO2: 21 mmol/L — ABNORMAL LOW (ref 22–32)
CREATININE: 1.07 mg/dL (ref 0.61–1.24)
Chloride: 102 mmol/L (ref 98–111)
GFR calc Af Amer: >60 mL/min (ref >60)
GFR calc non Af Amer: >60 mL/min (ref >60)
Glucose, Bld: 154 mg/dL — ABNORMAL HIGH (ref 70–99)
Potassium: 3.7 mmol/L (ref 3.5–5.1)
Sodium: 137 mmol/L (ref 135–145)

## 2018-05-29 LAB — GLUCOSE, CAPILLARY
GLUCOSE-CAPILLARY: 166 mg/dL — AB (ref 70–99)
Glucose-Capillary: 175 mg/dL — ABNORMAL HIGH (ref 70–99)

## 2018-05-29 MED ORDER — ASPIRIN 81 MG PO TBEC: 81.0000 mg | DELAYED_RELEASE_TABLET | Freq: Every day | ORAL | Status: AC

## 2018-05-29 MED ORDER — TICAGRELOR 90 MG PO TABS: 90.0000 mg | ORAL_TABLET | Freq: Two times a day (BID) | ORAL | 9 refills | Status: DC

## 2018-05-29 MED ORDER — METOPROLOL TARTRATE 25 MG PO TABS: 25.0000 mg | ORAL_TABLET | Freq: Two times a day (BID) | ORAL | 11 refills | Status: DC

## 2018-05-29 MED ORDER — ROSUVASTATIN CALCIUM 20 MG PO TABS: 20.0000 mg | ORAL_TABLET | Freq: Every day | ORAL | 4 refills | Status: DC

## 2018-05-29 MED FILL — ROSUVASTATIN CALCIUM 20 MG: 20 | 90 days supply | Qty: 90 | Fill #0

## 2018-05-29 MED FILL — BRILINTA 90 MG TABLET: 90 | 30 days supply | Qty: 60 | Fill #0

## 2018-05-29 NOTE — Telephone Encounter (Signed)
TOC Patiebt-   Please call Patient-   Pt has an appointment with Rosaria Ferries on 06-10-18

## 2018-05-29 NOTE — Progress Notes (Signed)
CARDIAC REHAB PHASE I   PRE:  Rate/Rhythm: 88 SR  BP:  Supine:   Sitting: 157/75  Standing:    SaO2: 100%RA  MODE:  Ambulation: 650 ft   POST:  Rate/Rhythm: 98 SR PVCs  BP:  Supine:   Sitting: 161/64  Standing:    SaO2: 99%RA 1000-1100 Pt walked 650 ft on RA with steady gait. Tolerated well. No CP. Education completed with pt and wife who voiced understanding. Stressed importance of brilinta with stent. Notified case manager to see. Reviewed NTG use, ex ed, gave diabetic and heart healthy diets, CRP 2. Discussed carb counting. Referred to [place] CRP 2.    Graylon Good, RN BSN  05/29/2018 10:53 AM

## 2018-05-29 NOTE — Care Management Note (Addendum)
Case Management Note  Patient Details  Name: Marvin Frost MRN: [id number] Date of Birth: [birth year]  Subjective/Objective:   Pt admitted with CP                 Action/Plan:  PTA independent from home with wife.  Pt has PCP.  TOC will provide discharge meds prior to pt leaving facility.  CM Provided pt Brilinta packet with copay cards and informed pt of ongoing copay   Expected Discharge Date:  05/29/18               Expected Discharge Plan:  Home/Self Care  In-House Referral:     Discharge planning Services  CM Consult, Medication Assistance  Post Acute Care Choice:    Choice offered to:     DME Arranged:    DME Agency:     HH Arranged:    HH Agency:     Status of Service:  Completed, signed off  If discussed at [employer], dates discussed:    Additional Comments:  Maryclare Labrador, RN 05/29/2018, 1:46 PM

## 2018-05-29 NOTE — Progress Notes (Signed)
Pt given discharge orders with medication new orders. Pt discharging with spouse. IV removed.

## 2018-05-29 NOTE — Care Management (Signed)
#    6.  S/W  SHARON @ [employer] RX # [phone]   BRILINTA  90 MG  BID COVER- YES CO-PAY- $ 28.00 TIER- 2 DRUG PRIOR APPROVAL- NO  PREFERRED PHARMACY :  YES [facility] AND [facility] M/O $ 24.00 [facility] PREFERRED

## 2018-05-29 NOTE — Discharge Summary (Signed)
Discharge Summary    Patient ID: Marvin Frost MRN: [id number]; DOB: [birth year]  Admit date: 05/27/2018 Discharge date: 05/29/2018  Primary Care Provider: Chipper Herb, MD  Primary Cardiologist: Minus Breeding, MD   Discharge Diagnoses    Principal Problem:   CAD S/P percutaneous coronary angioplasty Active Problems:   Type 2 diabetes mellitus not at goal [facility])   Essential hypertension   Dyslipidemia   Coronary artery disease involving native coronary artery of native heart without angina pectoris   Unstable angina (HCC)  Allergies Allergies  Allergen Reactions  . Liraglutide Other (See Comments)    Abdominal pain and enlarged pancreas on CT ? pancreatitis  . Baxinets [Cetylpyridinium-Benzocaine] Other (See Comments)    Unknown reaction  . Celebrex [Celecoxib] Nausea Only  . Toprol Xl [Metoprolol Succinate] Other (See Comments)    Unknown reaction - pt states it did not work  . Triplex Ad Other (See Comments)    Abdominal pain and enlarged pancreas on CT? pancreatitis  . Welchol [Colesevelam Hcl] Other (See Comments)    constipation  . Norvasc [Amlodipine Besylate] Rash  . Statins Other (See Comments)    muscle and joint pain if taken for long periods of time, takes Crestor every 2-3 days to avoid this reaction   Diagnostic Studies/Procedures    Cardiac catheterization 05/28/18:   Mid LAD lesion is 75% stenosed.  Dist LAD lesion is 40% stenosed.  Ost LAD lesion is 40% stenosed.  A drug-eluting stent was successfully placed using a STENT RESOLUTE ONYX 2.5X22.  Post intervention, there is a 0% residual stenosis.  The left ventricular systolic function is normal.  LV end diastolic pressure is normal.  The left ventricular ejection fraction is 55-65% by visual estimate.   1. Severe single vessel CAD with severe mid-LAD stenosis, hemodynamic significance confirmed by FFR, treated successfully with DES placement (2.5x22 mm Resolute Onyx DES). 2. Widely patent,  dominant LCx 3. Patent nondominant RCA 4. Normal LV systolic function with normal LVEDP  OK for DC tomorrow am if no complications. If difficulty tolerating brilinta would be comfortable with clopidogrel.    History of Present Illness     Marvin Frost is a 72 y.o. male with known CAD sent from clinic 05/27/18 by Dr. Percival Spanish for admission for CP concerning for unstable angina.   [name] has h/o CAD with a cath in 2004 that showed nonobstructive disease. He had a stress test in July 2018 that showed no ischemia. Also has hx of HTN, HLD and DM. He was seen in clinic 05/27/18 by Dr. Percival Spanish and endorsed severe SSCP, occuring off and on. Symptoms worsened over the last several days prior to presentation, initially exertional but now occurring at rest. Left sided CP w/ radiation to his left shoulder blade, left arm and jaw. Also associated dyspnea and nausea. He does have a h/o GERD but symptoms not like his prior GI pain. Of note, pt has had some recent black stools and has h/o gastric polyps treated by Dr. Carlean Purl. Dr. Percival Spanish recommended pt come to the ED for admission, FOBT to r/o GIB, IV heparin and possible cath if FOBT negative.   In ED, VSS. Labs pending. EKG shows no acute changes. CBC shows Hgb at 11. Was 10 in August. Pt notes he had a BM earlier today and stool looked normal. Stool guaiac was negative - has been on heparin with no issue. Plans were made for cardiac catheterization for more definitive evaluation.   Hospital Course  Cath performed on 05/28/18 which showed severe single vessel CAD with severe mid-LAD stenosis, hemodynamic significance confirmed by FFR, treated successfully with DES placement (2.5x22 mm Resolute Onyx DES). There was widely patent, dominant LCx, patent nondominant RCA and normal LV systolic function with normal LVEDP. Recommendations are for uninterrupted dual antiplatelet therapy with Aspirin 81mg  daily and Ticagrelor 90mg  twice daily for a minimum of 12  months. Per cath report, if unable to tolerate, would also be comfortable with Plavix.   Other hospital problems include:  -Hypokalemia -K on discharge>>3.7   -DM 2 -was on SSI while inpatient status>>will resume home regimen at d/c   Medication changes: -Add ASA, Brilinta, and Metoprolol 25mg  BID -Increase Crestor to 20mg    -As outpatient can add losartan 25 mg daily if BP tolerates given history of CAD and diabetes.  Dr. Margaretann Loveless has instructed the patient that dual antiplatelet therapy should be taken for 1 year without interruption.  We have discussed the consequences of interrupted dual antiplatelet therapy and the risk for in-stent thrombosis.   Consultants: None   The patient was seen and examined by Dr. Margaretann Loveless who feels that he is stable and ready for discharge today, 05/29/18. Cath site unremarkable. The patient has ambulated with cardiac rehabilitation without complication. Follow up appointments have been made.  _____________  Discharge Vitals Blood pressure 136/75, pulse 78, temperature 97.8 F (36.6 C), temperature source Oral, resp. rate 16, height 5\' 8"  (1.727 m), weight 100.7 kg, SpO2 97 %.  Filed Weights   05/27/18 2140  Weight: 100.7 kg   Labs & Radiologic Studies    CBC Recent Labs    05/28/18 0436 05/29/18 0217  WBC 10.8* 9.6  HGB 9.1* 9.1*  HCT 33.6* 34.6*  MCV 69.0* 70.3*  PLT 320 672   Basic Metabolic Panel Recent Labs    05/28/18 0436 05/29/18 0217  NA 137 137  K 3.4* 3.7  CL 102 102  CO2 23 21*  GLUCOSE 154* 154*  BUN 21 13  CREATININE 1.12 1.07  CALCIUM 8.9 8.8*   Cardiac Enzymes Recent Labs    05/27/18 2159 05/28/18 0436 05/28/18 0838  TROPONINI <0.03 0.10* <0.03  _____________  Dg Chest 2 View  Result Date: 05/27/2018 CLINICAL DATA:  Chest pain. EXAM: CHEST - 2 VIEW COMPARISON:  None FINDINGS: Normal heart size. No pleural effusion or edema. No airspace opacities. Visualized osseous structures are unremarkable. IMPRESSION:  1. No active cardiopulmonary abnormalities. Electronically Signed   By: Kerby Moors M.D.   On: 05/27/2018 19:45   Disposition   Pt is being discharged home today in good condition.  Follow-up Plans & Appointments   Follow-up Information    Barrett, Evelene Croon, PA-C Follow up on 06/10/2018.   Specialties:  Cardiology, Radiology Why:  Your follow up appointment will be on 06/10/18 at 0900am.  Contact information: [address] [place] [address] [phone]        Minus Breeding, MD Follow up on 07/09/2018.   Specialty:  Cardiology Why:  Your follow up appointment will be on 07/09/2018 at 3pm  Contact information: [address] [place] [address] [phone]          Discharge Instructions    Amb Referral to Cardiac Rehabilitation   Complete by:  As directed    Diagnosis:  Coronary Stents   Call MD for:  difficulty breathing, headache or visual disturbances   Complete by:  As directed    Call MD for:  extreme  fatigue   Complete by:  As directed    Call MD for:  hives   Complete by:  As directed    Call MD for:  persistant dizziness or light-headedness   Complete by:  As directed    Call MD for:  persistant nausea and vomiting   Complete by:  As directed    Call MD for:  redness, tenderness, or signs of infection (pain, swelling, redness, odor or green/yellow discharge around incision site)   Complete by:  As directed    Call MD for:  severe uncontrolled pain   Complete by:  As directed    Call MD for:  temperature >100.4   Complete by:  As directed    Diet - low sodium heart healthy   Complete by:  As directed    Discharge instructions   Complete by:  As directed    No driving for 3 days. No lifting over 5 lbs for 1 week. No sexual activity for 1 week. Keep procedure site clean & dry. If you notice increased pain, swelling, bleeding or pus, call/return!  You may shower, but no soaking baths/hot tubs/pools for 1 week.   PLEASE  DO NOT MISS ANY DOSES OF YOUR BRILINTA!!!!! Also keep a log of you blood pressures and bring back to your follow up appt. Please call the office with any questions.   Patients taking blood thinners should generally stay away from medicines like ibuprofen, Advil, Motrin, naproxen, and Aleve due to risk of stomach bleeding. You may take Tylenol as directed or talk to your primary doctor about alternatives.  Some studies suggest Prilosec/Omeprazole interacts with Plavix. If you have reflux symptoms, please use Protonix for less chance of interaction.   If you notice any bleeding such as blood in stool, black tarry stools, blood in urine, nosebleeds or any other unusual bleeding, call your doctor immediately.   Increase activity slowly   Complete by:  As directed      Discharge Medications   Allergies as of 05/29/2018      Reactions   Liraglutide Other (See Comments)   Abdominal pain and enlarged pancreas on CT ? pancreatitis   Baxinets [cetylpyridinium-benzocaine] Other (See Comments)   Unknown reaction   Celebrex [celecoxib] Nausea Only   Toprol Xl [metoprolol Succinate] Other (See Comments)   Unknown reaction - pt states it did not work   Triplex Ad Other (See Comments)   Abdominal pain and enlarged pancreas on CT? pancreatitis   Welchol [colesevelam Hcl] Other (See Comments)   constipation   Norvasc [amlodipine Besylate] Rash   Statins Other (See Comments)   muscle and joint pain if taken for long periods of time, takes Crestor every 2-3 days to avoid this reaction      Medication List    STOP taking these medications   acetaminophen 500 MG tablet Commonly known as:  TYLENOL   aspirin 81 MG tablet Replaced by:  aspirin 81 MG EC tablet   aspirin EC 81 MG tablet     TAKE these medications   amoxicillin 500 MG capsule Commonly known as:  AMOXIL Take 2,000 mg by mouth See admin instructions. Take four capsules (2000 mg) by mouth one hour prior to dental appointments     aspirin 81 MG EC tablet Take 1 tablet (81 mg total) by mouth daily. Start taking on:  05/30/2018 Replaces:  aspirin 81 MG tablet   BENICAR HCT 40-25 MG tablet Generic drug:  olmesartan-hydrochlorothiazide TAKE 1 TABLET DAILY  cetirizine 10 MG tablet Commonly known as:  ZYRTEC Take 10 mg by mouth daily as needed for allergies or rhinitis.   diclofenac 75 MG EC tablet Commonly known as:  VOLTAREN Take 1 tablet (75 mg total) by mouth 2 (two) times daily.   Empagliflozin-metFORMIN HCl 12.10-998 MG Tabs Take 1 tablet by mouth 2 (two) times daily.   ferrous sulfate 325 (65 FE) MG tablet Take 1 tablet (325 mg total) by mouth every other day.   freestyle lancets Check BS BID and PRN. DX.E11.9   FREESTYLE LITE Devi Use to check BS bid. DX. E11.9   glucose blood test strip USE TO CHECK BLOOD SUGAR TWICE A DAY AND AS NEEDED   Icosapent Ethyl 1 g Caps Take 2 capsules by mouth 2 (two) times daily.   insulin aspart 100 UNIT/ML FlexPen Commonly known as:  NOVOLOG INJECT 16 TO 20 UNITS UNDER THE SKIN TWICE A DAY What changed:    how much to take  how to take this  when to take this  additional instructions   Insulin Degludec 200 UNIT/ML Sopn Inject 60-70 Units into the skin daily. What changed:    how much to take  when to take this   Insulin Pen Needle 32G X 5 MM Misc USE WITH INSULIN PENS UP TO 5 TIMES DAILY   INTEGRA PLUS Caps Take 1 capsule by mouth daily. What changed:    when to take this  reasons to take this   pantoprazole 40 MG tablet Commonly known as:  PROTONIX TAKE 1 TABLET TWICE A DAY What changed:  when to take this   REPATHA SURECLICK 920 MG/ML Soaj Generic drug:  Evolocumab INJECT 140 MG UNDER THE SKIN EVERY 14 DAYS What changed:  See the new instructions.   rosuvastatin 20 MG tablet Commonly known as:  CRESTOR Take 1 tablet (20 mg total) by mouth daily. What changed:    medication strength  See the new instructions.   ticagrelor  90 MG Tabs tablet Commonly known as:  BRILINTA Take 1 tablet (90 mg total) by mouth 2 (two) times daily.        Acute coronary syndrome (MI, NSTEMI, STEMI, etc) this admission?: Yes.     AHA/ACC Clinical Performance & Quality Measures: 1. Aspirin prescribed? - Yes 2. ADP Receptor Inhibitor (Plavix/Clopidogrel, Brilinta/Ticagrelor or Effient/Prasugrel) prescribed (includes medically managed patients)? - Yes 3. Beta Blocker prescribed? - Yes 4. High Intensity Statin (Lipitor 40-80mg  or Crestor 20-40mg ) prescribed? - No - intolerant to high dose statin  5. EF assessed during THIS hospitalization? - Yes 6. For EF <40%, was ACEI/ARB prescribed? - Not Applicable (EF >/= 10%) 7. For EF <40%, Aldosterone Antagonist (Spironolactone or Eplerenone) prescribed? - Not Applicable (EF >/= 07%) 8. Cardiac Rehab Phase II ordered (Included Medically managed Patients)? - Yes    Outstanding Labs/Studies   None   Duration of Discharge Encounter   Greater than 30 minutes including physician time.  Signed, Kathyrn Drown, NP 05/29/2018, 12:54 PM

## 2018-05-29 NOTE — Telephone Encounter (Signed)
Currently admitted.  TOC call.

## 2018-05-29 NOTE — Progress Notes (Signed)
Progress Note  Patient Name: Marvin Frost Date of Encounter: 05/29/2018  Primary Cardiologist: Minus Breeding, MD   Subjective   Feels well, no chest pain overnight.   Inpatient Medications    Scheduled Meds: . aspirin EC  81 mg Oral Daily  . insulin aspart  0-15 Units Subcutaneous TID WC  . pantoprazole  40 mg Oral BID  . rosuvastatin  10 mg Oral q1800  . sodium chloride flush  3 mL Intravenous Q12H  . ticagrelor  90 mg Oral BID   Continuous Infusions: . sodium chloride     PRN Meds: sodium chloride, acetaminophen, ondansetron (ZOFRAN) IV, sodium chloride flush   Vital Signs    Vitals:   05/28/18 1640 05/29/18 0048 05/29/18 0423 05/29/18 0738  BP: 125/62 126/76 (!) 144/75 136/75  Pulse: 72 77 80 78  Resp: 19 15 17 16   Temp: 97.8 F (36.6 C) 97.7 F (36.5 C) 97.9 F (36.6 C) 97.8 F (36.6 C)  TempSrc: Oral Oral Oral Oral  SpO2: 99% 100% 95% 97%  Weight:      Height:        Intake/Output Summary (Last 24 hours) at 05/29/2018 1231 Last data filed at 05/29/2018 0428 Gross per 24 hour  Intake -  Output 700 ml  Net -700 ml   Filed Weights   05/27/18 2140  Weight: 100.7 kg    Telemetry    NSR - Personally Reviewed  ECG    nsr - Personally Reviewed  Physical Exam   GEN: No acute distress.   Neck: No JVD Cardiac: regular rhythm, normal rate, no murmurs, rubs, or gallops.  Respiratory: Clear to auscultation bilaterally. GI: Soft, nontender, non-distended  MS: No edema; No deformity. Neuro:  Nonfocal  Psych: Normal affect   Labs    Chemistry Recent Labs  Lab 05/27/18 1712 05/27/18 1855 05/28/18 0436 05/29/18 0217  NA 137 138 137 137  K 3.6 3.5 3.4* 3.7  CL 101 106 102 102  CO2 20*  --  23 21*  GLUCOSE 86 103* 154* 154*  BUN 22 21 21 13   CREATININE 1.09 1.10 1.12 1.07  CALCIUM 8.7*  --  8.9 8.8*  GFRNONAA >60  --  >60 >60  GFRAA >60  --  >60 >60  ANIONGAP 16*  --  12 14     Hematology Recent Labs  Lab 05/27/18 1712  05/27/18 1855 05/28/18 0436 05/29/18 0217  WBC 11.5*  --  10.8* 9.6  RBC 4.98  --  4.87 4.92  HGB 9.2* 11.2* 9.1* 9.1*  HCT 35.4* 33.0* 33.6* 34.6*  MCV 71.1*  --  69.0* 70.3*  MCH 18.5*  --  18.7* 18.5*  MCHC 26.0*  --  27.1* 26.3*  RDW 24.4*  --  24.1* 24.6*  PLT 358  --  320 327    Cardiac Enzymes Recent Labs  Lab 05/27/18 2159 05/28/18 0436 05/28/18 0838  TROPONINI <0.03 0.10* <0.03    Recent Labs  Lab 05/27/18 1855  TROPIPOC 0.00     BNP Recent Labs  Lab 05/27/18 1800  BNP 9.6     DDimer No results for input(s): DDIMER in the last 168 hours.   Radiology    Dg Chest 2 View  Result Date: 05/27/2018 CLINICAL DATA:  Chest pain. EXAM: CHEST - 2 VIEW COMPARISON:  None FINDINGS: Normal heart size. No pleural effusion or edema. No airspace opacities. Visualized osseous structures are unremarkable. IMPRESSION: 1. No active cardiopulmonary abnormalities. Electronically Signed   By: Lovena Le  Clovis Riley M.D.   On: 05/27/2018 19:45    Cardiac Studies  Cath 05/28/18  Mid LAD lesion is 75% stenosed.  Dist LAD lesion is 40% stenosed.  Ost LAD lesion is 40% stenosed.  A drug-eluting stent was successfully placed using a STENT RESOLUTE ONYX 2.5X22.  Post intervention, there is a 0% residual stenosis.  The left ventricular systolic function is normal.  LV end diastolic pressure is normal.  The left ventricular ejection fraction is 55-65% by visual estimate.   1. Severe single vessel CAD with severe mid-LAD stenosis, hemodynamic significance confirmed by FFR, treated successfully with DES placement (2.5x22 mm Resolute Onyx DES). 2. Widely patent, dominant LCx 3. Patent nondominant RCA 4. Normal LV systolic function with normal LVEDP  OK for DC tomorrow am if no complications. If difficulty tolerating brilinta would be comfortable with clopidogrel.    Patient Profile     Marvin Frost a 72 y.o.malewith known nonobstructive CAD sent from clinic today by Dr.  Percival Spanish to the ED for admission for CP concerning for unstable angina.  Assessment & Plan   Active Problems:   Unstable angina (HCC)    CAD s/p PCI - continue ASA, ticagrelor. Increase rosuvastatin to 20 mg daily. Add metoprolol 25 mg BID.  As outpatient can add losartan 25 mg daily if bp tolerates given history of CAD and diabetes.  I have instructed the patient that dual antiplatelet therapy should be taken for 1 year without interruption.  We have discussed the consequences of interrupted dual antiplatelet therapy and the risk for in-stent thrombosis.   F/u: 2 weeks with APP,  6 weeks with Dr. Percival Spanish. Dismissal: can be safely dismissed from hospital.   For questions or updates, please contact [place] Please consult [email or web address] for contact info under       Signed, Elouise Munroe, MD  05/29/2018, 12:31 PM

## 2018-05-29 NOTE — Plan of Care (Signed)
  Problem: Education: Goal: Knowledge of General Education information will improve Description: Including pain rating scale, medication(s)/side effects and non-pharmacologic comfort measures Outcome: Adequate for Discharge   Problem: Health Behavior/Discharge Planning: Goal: Ability to manage health-related needs will improve Outcome: Adequate for Discharge   Problem: Clinical Measurements: Goal: Ability to maintain clinical measurements within normal limits will improve Outcome: Adequate for Discharge Goal: Will remain free from infection Outcome: Adequate for Discharge Goal: Diagnostic test results will improve Outcome: Adequate for Discharge Goal: Respiratory complications will improve Outcome: Adequate for Discharge Goal: Cardiovascular complication will be avoided Outcome: Adequate for Discharge   Problem: Activity: Goal: Risk for activity intolerance will decrease Outcome: Adequate for Discharge   Problem: Coping: Goal: Level of anxiety will decrease Outcome: Adequate for Discharge   Problem: Elimination: Goal: Will not experience complications related to bowel motility Outcome: Adequate for Discharge Goal: Will not experience complications related to urinary retention Outcome: Adequate for Discharge   Problem: Pain Managment: Goal: General experience of comfort will improve Outcome: Adequate for Discharge   Problem: Safety: Goal: Ability to remain free from injury will improve Outcome: Adequate for Discharge   Problem: Skin Integrity: Goal: Risk for impaired skin integrity will decrease Outcome: Adequate for Discharge   

## 2018-05-29 NOTE — Telephone Encounter (Signed)
Patient contacted regarding discharge from [facility] on 05/28/18.  Patient understands to follow up with provider Rosaria Ferries PA on 06/10/18 at 9:00 at [facility]. Patient understands discharge instructions? Yes Patient understands medications and regiment? Yes Patient understands to bring all medications to this visit? Yes

## 2018-05-29 NOTE — Plan of Care (Signed)
  Problem: Education: Goal: Knowledge of General Education information will improve Description Including pain rating scale, medication(s)/side effects and non-pharmacologic comfort measures Outcome: Progressing Note:  POC reviewed with pt.; pt. little anxious earlier, and RN talked with pt. some; felt better afterwards.

## 2018-05-30 ENCOUNTER — Other Ambulatory Visit: Payer: Self-pay | Admitting: Cardiology

## 2018-05-30 MED ORDER — NITROGLYCERIN 0.4 MG SL SUBL: 0.4000 mg | SUBLINGUAL_TABLET | SUBLINGUAL | 1 refills | Status: DC | PRN

## 2018-05-30 MED FILL — Nitroglycerin IV Soln 200 MCG/ML in D5W: INTRAVENOUS | Qty: 250 | Status: AC

## 2018-05-30 NOTE — Telephone Encounter (Signed)
° ° °  Patient states Marvin Drown NP was to call order for Marvin Frost upon discharge...    *STAT* If patient is at the pharmacy, call can be transferred to refill team.   1. Which medications need to be refilled? (please list name of each medication and dose if known) Nitro  2. Which pharmacy/location (including street and city if local pharmacy) is medication to be sent to?Marvin Frost, Marvin Frost  3. Do they need a 30 day or 90 day supply? Marvin Frost

## 2018-05-30 NOTE — Telephone Encounter (Signed)
Pt scheduled with Dr Livia Snellen for hospital f/u 12/16 at 3:10.

## 2018-06-06 ENCOUNTER — Other Ambulatory Visit: Payer: Self-pay | Admitting: *Deleted

## 2018-06-06 MED ORDER — TICAGRELOR 90 MG PO TABS: 90.0000 mg | ORAL_TABLET | Freq: Two times a day (BID) | ORAL | 3 refills | Status: DC

## 2018-06-06 MED ORDER — METOPROLOL TARTRATE 25 MG PO TABS: 25.0000 mg | ORAL_TABLET | Freq: Two times a day (BID) | ORAL | 3 refills | Status: DC

## 2018-06-06 NOTE — Telephone Encounter (Signed)
Rx has been sent to the pharmacy electronically. ° °

## 2018-06-07 ENCOUNTER — Telehealth (HOSPITAL_COMMUNITY): Payer: Self-pay

## 2018-06-07 NOTE — Telephone Encounter (Signed)
Attempted to call patient in regards to Cardiac Rehab - LM on VM 

## 2018-06-07 NOTE — Telephone Encounter (Signed)
Pt insurance is active and benefits verified through Medicare A/B. Co-pay $0.00, DED $185.00/$185.00 met, out of pocket $0.00/$0.00 met, co-insurance 20%. No pre-authorization required. Passport, 06/07/18 @ 3:13PM, REF# [id number]

## 2018-06-10 ENCOUNTER — Ambulatory Visit: Payer: Medicare Other | Admitting: Family Medicine

## 2018-06-10 ENCOUNTER — Ambulatory Visit (INDEPENDENT_AMBULATORY_CARE_PROVIDER_SITE_OTHER): Payer: Medicare Other | Admitting: Physician Assistant

## 2018-06-10 ENCOUNTER — Encounter: Payer: Self-pay | Admitting: Physician Assistant

## 2018-06-10 VITALS — BP 158/72 | HR 76 | Ht 68.0 in | Wt 223.4 lb

## 2018-06-10 DIAGNOSIS — I10 Essential (primary) hypertension: Secondary | ICD-10-CM

## 2018-06-10 DIAGNOSIS — I2510 Atherosclerotic heart disease of native coronary artery without angina pectoris: Secondary | ICD-10-CM

## 2018-06-10 DIAGNOSIS — E785 Hyperlipidemia, unspecified: Secondary | ICD-10-CM

## 2018-06-10 MED ORDER — METOPROLOL TARTRATE 50 MG PO TABS: 50.0000 mg | ORAL_TABLET | Freq: Two times a day (BID) | ORAL | 3 refills | Status: DC

## 2018-06-10 NOTE — Patient Instructions (Signed)
Medication Instructions:  Increase Metoprolol to 50 mg twice daily. If you need a refill on your cardiac medications before your next appointment, please call your pharmacy.   Lab work: None If you have labs (blood work) drawn today and your tests are completely normal, you will receive your results only by: Marland Kitchen MyChart Message (if you have MyChart) OR . A paper copy in the mail If you have any lab test that is abnormal or we need to change your treatment, we will call you to review the results.  Testing/Procedures: None  Follow-Up: At [facility], you and your health needs are our priority.  As part of our continuing mission to provide you with exceptional heart care, we have created designated Provider Care Teams.  These Care Teams include your primary Cardiologist (physician) and Advanced Practice Providers (APPs -  Physician Assistants and Nurse Practitioners) who all work together to provide you with the care you need, when you need it. Marland Kitchen Keep follow up with Dr.Hochrein as scheduled in January.  Any Other Special Instructions Will Be Listed Below (If Applicable). Continue working out, exertion level is 5/10. Work up to walking 1 hour a day. Target HR is less than 110. Target BP is 130/80

## 2018-06-10 NOTE — Progress Notes (Signed)
Cardiology Office Note   Date:  06/10/2018   ID:  Marvin Frost, DOB [birth year], MRN [id number]  PCP:  Chipper Herb, MD Cardiologist:  Minus Breeding, MD 05/27/2018 in hospital Rosaria Ferries, PA-C   Chief Complaint  Patient presents with  . Hospitalization Follow-up    SOB(anxiety), none with activites, denies chest pains, swelling, when he takes deep breaths he notices soreness in his back area left side.    History of Present Illness: Marvin Frost is a 72 y.o. male with a history of non-obs CAD 2004, DM, HTN, HLD, GERD, OA, skin CA, pancreatitis, obesity  Admitted 12/2-12/09/2017 for unstable anginal pain, cath with DES LAD, normal EF  Marvin Frost presents for cardiology follow up.   He has not had any exertional chest pain since d/c. He has had some chest soreness, when he takes a deep breath. The discomfort is between his shoulders.   He is walking on a treadmill or outside. Doing well w/ this. Trying to increase distance but has a lot of hills. No chest pain or SOB w/ exertion.   No problems tolerating medications.  HR is lower than before stent, even w/ exertion.   Does not wake w/ LE edema (was having it, this is improved).  His blood sugar is improved. Does not want diabetic classes, is working on this at home.  His BP is improved, but still generally > 130/80. HR > 70.  BP is lowest when outside doing work. Thinks he is hydrated but may need a little more water.   Has a skin cancer on his upper left forehead that needs to be removed, he understands that he cannot stop the aspirin and Brilinta right now.   Past Medical History:  Diagnosis Date  . Allergy   . Arthritis   . CAD (coronary artery disease)    Nonobstructive Cath 2004  . 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 Cataract   . Diabetes mellitus   . Dysrhythmia    history of heart palpitations   . Gastric polyps 09/18/2017  . GERD (gastroesophageal reflux disease)   .  History of frequent urinary tract infections   . History of hiatal hernia   . Hyperlipemia   . Hypertension   . Obesity   . Pancreatitis     Past Surgical History:  Procedure Laterality Date  . CARDIAC CATHETERIZATION     2004 and 1999- done by Dr Percival Spanish   . COLONOSCOPY  11/2003   diverticulosis, no polyps  . CORONARY STENT INTERVENTION N/A 05/28/2018   Procedure: CORONARY STENT INTERVENTION;  Surgeon: Sherren Mocha, MD;  Location: [facility];  Service: Cardiovascular;  Laterality: N/A;  . EYE SURGERY Bilateral    cateracts - dr Katy Fitch  . HYDROCELE EXCISION / REPAIR    . INTRAVASCULAR PRESSURE WIRE/FFR STUDY N/A 05/28/2018   Procedure: INTRAVASCULAR PRESSURE WIRE/FFR STUDY;  Surgeon: Sherren Mocha, MD;  Location: [facility];  Service: Cardiovascular;  Laterality: N/A;  . left calf surgery      related to trauma of being torn   . LEFT HEART CATH AND CORONARY ANGIOGRAPHY N/A 05/28/2018   Procedure: LEFT HEART CATH AND CORONARY ANGIOGRAPHY;  Surgeon: Sherren Mocha, MD;  Location: [facility];  Service: Cardiovascular;  Laterality: N/A;  . left index finger arthrioscopic surgery     . lymph node removed from right neck     . right shoulder  surgery    . TOTAL HIP ARTHROPLASTY Right 03/16/2015   Procedure: RIGHT TOTAL HIP ARTHROPLASTY ANTERIOR APPROACH;  Surgeon: Paralee Cancel, MD;  Location: [facility];  Service: Orthopedics;  Laterality: Right;  . TOTAL HIP ARTHROPLASTY Left 06/22/2015   Procedure: LEFT TOTAL HIP ARTHROPLASTY ANTERIOR APPROACH;  Surgeon: Paralee Cancel, MD;  Location: [facility];  Service: Orthopedics;  Laterality: Left;  . UPPER GASTROINTESTINAL ENDOSCOPY  08/19/2010   hiatal hernia 2cm, mild gastritis    Current Outpatient Medications  Medication Sig Dispense Refill  . amoxicillin (AMOXIL) 500 MG capsule Take 2,000 mg by mouth See admin instructions. Take four capsules (2000 mg) by mouth one hour prior to dental appointments    . aspirin EC 81 MG EC  tablet Take 1 tablet (81 mg total) by mouth daily.    Marland Kitchen BENICAR HCT 40-25 MG tablet TAKE 1 TABLET DAILY (Patient taking differently: Take 1 tablet by mouth daily. ) 90 tablet 1  . Blood Glucose Monitoring Suppl (FREESTYLE LITE) DEVI Use to check BS bid. DX. E11.9 1 each 0  . cetirizine (ZYRTEC) 10 MG tablet Take 10 mg by mouth daily as needed for allergies or rhinitis.     Marland Kitchen diclofenac (VOLTAREN) 75 MG EC tablet Take 1 tablet (75 mg total) by mouth 2 (two) times daily. (Patient not taking: Reported on 05/27/2018) 30 tablet 0  . Empagliflozin-metFORMIN HCl (SYNJARDY) 12.10-998 MG TABS Take 1 tablet by mouth 2 (two) times daily. 180 tablet 3  . FeFum-FePoly-FA-B Cmp-C-Biot (INTEGRA PLUS) CAPS Take 1 capsule by mouth daily. (Patient taking differently: Take 1 capsule by mouth daily as needed (energy). ) 90 capsule 1  . ferrous sulfate 325 (65 FE) MG tablet Take 1 tablet (325 mg total) by mouth every other day. (Patient not taking: Reported on 05/27/2018)  3  . glucose blood (FREESTYLE LITE) test strip USE TO CHECK BLOOD SUGAR TWICE A DAY AND AS NEEDED 300 each 3  . Icosapent Ethyl (VASCEPA) 1 g CAPS Take 2 capsules by mouth 2 (two) times daily. (Patient not taking: Reported on 05/27/2018) 360 capsule 0  . insulin aspart (NOVOLOG FLEXPEN) 100 UNIT/ML FlexPen INJECT 16 TO 20 UNITS UNDER THE SKIN TWICE A DAY (Patient taking differently: Inject 10-22 Units into the skin See admin instructions. Inject 22 units subcutaneously before breakfast and after supper, inject 10-15 units at bedtime per sliding scale: CBG <200 10 - 12 units, >200 15 units) 45 mL 3  . Insulin Degludec (TRESIBA FLEXTOUCH) 200 UNIT/ML SOPN Inject 60-70 Units into the skin daily. (Patient taking differently: Inject 72 Units into the skin at bedtime. ) 60 mL 3  . Insulin Pen Needle (NOVOTWIST) 32G X 5 MM MISC USE WITH INSULIN PENS UP TO 5 TIMES DAILY 300 each 9  . Lancets (FREESTYLE) lancets Check BS BID and PRN. DX.E11.9 300 each 3  .  metoprolol tartrate (LOPRESSOR) 25 MG tablet Take 1 tablet (25 mg total) by mouth 2 (two) times daily. 180 tablet 3  . nitroGLYCERIN (NITROSTAT) 0.4 MG SL tablet Place 1 tablet (0.4 mg total) under the tongue every 5 (five) minutes as needed for chest pain. 25 tablet 1  . pantoprazole (PROTONIX) 40 MG tablet TAKE 1 TABLET TWICE A DAY (Patient taking differently: Take 40 mg by mouth 2 (two) times daily after a meal. ) 180 tablet 0  . REPATHA SURECLICK 938 MG/ML SOAJ INJECT 140 MG UNDER THE SKIN EVERY 14 DAYS (Patient taking differently: Inject 140 mg into the skin every 30 (  thirty) days. ) 6 mL 4  . rosuvastatin (CRESTOR) 20 MG tablet Take 1 tablet (20 mg total) by mouth daily. 90 tablet 4  . ticagrelor (BRILINTA) 90 MG TABS tablet Take 1 tablet (90 mg total) by mouth 2 (two) times daily. 180 tablet 3   No current facility-administered medications for this visit.     Allergies:   Liraglutide; Baxinets [cetylpyridinium-benzocaine]; Celebrex [celecoxib]; Toprol xl [metoprolol succinate]; Triplex ad; Welchol [colesevelam hcl]; Norvasc [amlodipine besylate]; and Statins    Social History:  The patient  reports that he quit smoking about 34 years ago. His smoking use included cigarettes. He has a 12.00 pack-year smoking history. He quit smokeless tobacco use about 39 years ago.  His smokeless tobacco use included chew. He reports that he does not drink alcohol or use drugs.   Family History:  The patient's family history includes Diabetes in his brother; GI problems in his sister; Healthy in his daughter; Heart attack (age of onset: 36) in his father; Heart attack (age of onset: 52) in his brother; Heart disease in his brother and mother.  He indicated that his mother is deceased. He indicated that his father is deceased. He indicated that his sister is alive. He indicated that his brother is alive. He indicated that his daughter is alive. He indicated that the status of his neg hx is unknown.   ROS:   Please see the history of present illness. All other systems are reviewed and negative.    PHYSICAL EXAM: VS:  BP (!) 158/72 (BP Location: Left Arm, Patient Position: Sitting)   Pulse 76   Ht 5\' 8"  (1.727 m)   Wt 223 lb 6.4 oz (101.3 kg)   BMI 33.97 kg/m  , BMI Body mass index is 33.97 kg/m. GEN: Well nourished, well developed, male in no acute distress HEENT: normal for age  Neck: no JVD, ?faint Left carotid bruit, no masses Cardiac: RRR; no murmur, no rubs, or gallops Respiratory:  clear to auscultation bilaterally, normal work of breathing GI: soft, nontender, nondistended, + BS MS: no deformity or atrophy; no edema; distal pulses are 2+ in all 4 extremities; R radial cath site healed well.   Skin: warm and dry, no rash Neuro:  Strength and sensation are intact Psych: euthymic mood, full affect   EKG:  EKG is ordered today. The ekg ordered today demonstrates SR, heart rate 76, normal intervals, inferior Q waves are noted in leads III and aVF, no change from 05/28/2018   Cardiac catheterization 05/28/18:   Mid LAD lesion is 75% stenosed.  Dist LAD lesion is 40% stenosed.  Ost LAD lesion is 40% stenosed.  A drug-eluting stent was successfully placed using a STENT RESOLUTE ONYX 2.5X22.  Post intervention, there is a 0% residual stenosis.  The left ventricular systolic function is normal.  LV end diastolic pressure is normal.  The left ventricular ejection fraction is 55-65% by visual estimate.  1. Severe single vessel CAD with severe mid-LAD stenosis, hemodynamic significance confirmed by FFR, treated successfully with DES placement (2.5x22 mm Resolute Onyx DES). 2. Widely patent, dominant LCx 3. Patent nondominant RCA 4. Normal LV systolic function with normal LVEDP  OK for DC tomorrow am if no complications. If difficulty tolerating brilinta would be comfortable with clopidogrel.  Intervention      Recent Labs: 02/19/2018: ALT 17 05/27/2018: B Natriuretic  Peptide 9.6 05/29/2018: BUN 13; Creatinine, Ser 1.07; Hemoglobin 9.1; Platelets 327; Potassium 3.7; Sodium 137  CBC    Component  Value Date/Time   WBC 9.6 05/29/2018 0217   RBC 4.92 05/29/2018 0217   HGB 9.1 (L) 05/29/2018 0217   HGB 10.7 (L) 02/19/2018 1310   HCT 34.6 (L) 05/29/2018 0217   HCT 37.8 02/19/2018 1310   PLT 327 05/29/2018 0217   PLT 415 02/19/2018 1310   MCV 70.3 (L) 05/29/2018 0217   MCV 68 (L) 02/19/2018 1310   MCH 18.5 (L) 05/29/2018 0217   MCHC 26.3 (L) 05/29/2018 0217   RDW 24.6 (H) 05/29/2018 0217   RDW 19.3 (H) 02/19/2018 1310   LYMPHSABS 1.8 02/19/2018 1310   MONOABS 1.0 08/16/2010 1608   EOSABS 0.7 (H) 02/19/2018 1310   BASOSABS 0.2 02/19/2018 1310   CMP Latest Ref Rng & Units 05/29/2018 05/28/2018 05/27/2018  Glucose 70 - 99 mg/dL 154(H) 154(H) 103(H)  BUN 8 - 23 mg/dL 13 21 21   Creatinine 0.61 - 1.24 mg/dL 1.07 1.12 1.10  Sodium 135 - 145 mmol/L 137 137 138  Potassium 3.5 - 5.1 mmol/L 3.7 3.4(L) 3.5  Chloride 98 - 111 mmol/L 102 102 106  CO2 22 - 32 mmol/L 21(L) 23 -  Calcium 8.9 - 10.3 mg/dL 8.8(L) 8.9 -  Total Protein 6.0 - 8.5 g/dL - - -  Total Bilirubin 0.0 - 1.2 mg/dL - - -  Alkaline Phos 39 - 117 IU/L - - -  AST 0 - 40 IU/L - - -  ALT 0 - 44 IU/L - - -     Lipid Panel Lab Results  Component Value Date   CHOL 78 (L) 02/19/2018   HDL 38 (L) 02/19/2018   LDLCALC 9 02/19/2018   LDLDIRECT 39 11/02/2016   TRIG 155 (H) 02/19/2018   CHOLHDL 2.1 02/19/2018      Wt Readings from Last 3 Encounters:  06/10/18 223 lb 6.4 oz (101.3 kg)  05/27/18 222 lb 0.1 oz (100.7 kg)  05/27/18 225 lb 6.4 oz (102.2 kg)     Other studies Reviewed: Additional studies/ records that were reviewed today include: Office notes, hospital records and testing.  ASSESSMENT AND PLAN:  1.  CAD: Recent DES LAD.  No other significant disease. -No ongoing ischemic symptoms.  Okay to increase activity as tolerated.  Limit exertion level to 5/10. -Emphasized that cardiac  risk factor control from here on out is paramount.  He is working on decreasing his hemoglobin A1c.  His LDL is at target thanks to [place].  He is willing to increase blood pressure medications.  2.  Hypertension: According to his records, his home blood pressures have been consistently greater than 130/80 and heart rate is always greater than 70. -Increase the metoprolol from 25 mg twice daily up to 50 mg twice daily. - Continue to track his blood pressure daily at various times and bring the results to his follow-up appointment with Dr. Percival Spanish  3.  Hyperlipidemia: His LDL dropped so low that they can add to decrease the Repatha from every 3 weeks to every 4 weeks.  On that dose, his cholesterol has come up.  LDL was 9 in August.  Recheck as scheduled  Current medicines are reviewed at length with the patient today.  The patient does not have concerns regarding medicines.  The following changes have been made: Increase metoprolol  Labs/ tests ordered today include:  No orders of the defined types were placed in this encounter.    Disposition:   FU with Minus Breeding, MD  Signed, Rosaria Ferries, PA-C  06/10/2018 9:13 AM  [place] Phone: [phone]; Fax: [phone]

## 2018-06-25 ENCOUNTER — Telehealth (HOSPITAL_COMMUNITY): Payer: Self-pay

## 2018-06-25 ENCOUNTER — Encounter (HOSPITAL_COMMUNITY): Payer: Self-pay

## 2018-06-25 NOTE — Telephone Encounter (Signed)
Attempted to call pt a 2nd time- LM ON VM ° °Mailed letter out °

## 2018-06-27 DIAGNOSIS — D0439 Carcinoma in situ of skin of other parts of face: Secondary | ICD-10-CM | POA: Diagnosis not present

## 2018-07-01 ENCOUNTER — Ambulatory Visit (INDEPENDENT_AMBULATORY_CARE_PROVIDER_SITE_OTHER): Payer: Medicare Other | Admitting: Family Medicine

## 2018-07-01 ENCOUNTER — Encounter: Payer: Self-pay | Admitting: Family Medicine

## 2018-07-01 VITALS — BP 139/75 | HR 82 | Temp 97.1°F | Ht 68.0 in | Wt 226.0 lb

## 2018-07-01 DIAGNOSIS — L282 Other prurigo: Secondary | ICD-10-CM

## 2018-07-01 MED ORDER — HYDROXYZINE HCL 50 MG PO TABS: 50.0000 mg | ORAL_TABLET | Freq: Three times a day (TID) | ORAL | 1 refills | Status: DC | PRN

## 2018-07-01 NOTE — Patient Instructions (Signed)
Rash, Adult    A rash is a change in the color of your skin. A rash can also change the way your skin feels. There are many different conditions and factors that can cause a rash.  Follow these instructions at home:  The goal of treatment is to stop the itching and keep the rash from spreading. Watch for any changes in your symptoms. Let your doctor know about them. Follow these instructions to help with your condition:  Medicine  Take or apply over-the-counter and prescription medicines only as told by your doctor. These may include medicines:   To treat red or swollen skin (corticosteroid creams).   To treat itching.   To treat an allergy (oral antihistamines).   To treat very bad symptoms (oral corticosteroids).    Skin care   Put cool cloths (compresses) on the affected areas.   Do not scratch or rub your skin.   Avoid covering the rash. Make sure that the rash is exposed to air as much as possible.  Managing itching and discomfort   Avoid hot showers or baths. These can make itching worse. A cold shower may help.   Try taking a bath with:  ? Epsom salts. You can get these at your local pharmacy or grocery store. Follow the instructions on the package.  ? Baking soda. Pour a small amount into the bath as told by your doctor.  ? Colloidal oatmeal. You can get this at your local pharmacy or grocery store. Follow the instructions on the package.   Try putting baking soda paste onto your skin. Stir water into baking soda until it gets like a paste.   Try putting on a lotion that relieves itchiness (calamine lotion).   Keep cool and out of the sun. Sweating and being hot can make itching worse.  General instructions     Rest as needed.   Drink enough fluid to keep your pee (urine) pale yellow.   Wear loose-fitting clothing.   Avoid scented soaps, detergents, and perfumes. Use gentle soaps, detergents, perfumes, and other cosmetic products.   Avoid anything that causes your rash. Keep a journal to  help track what causes your rash. Write down:  ? What you eat.  ? What cosmetic products you use.  ? What you drink.  ? What you wear. This includes jewelry.   Keep all follow-up visits as told by your doctor. This is important.  Contact a doctor if:   You sweat at night.   You lose weight.   You pee (urinate) more than normal.   You pee less than normal, or you notice that your pee is a darker color than normal.   You feel weak.   You throw up (vomit).   Your skin or the whites of your eyes look yellow (jaundice).   Your skin:  ? Tingles.  ? Is numb.   Your rash:  ? Does not go away after a few days.  ? Gets worse.   You are:  ? More thirsty than normal.  ? More tired than normal.   You have:  ? New symptoms.  ? Pain in your belly (abdomen).  ? A fever.  ? Watery poop (diarrhea).  Get help right away if:   You have a fever and your symptoms suddenly get worse.   You start to feel mixed up (confused).   You have a very bad headache or a stiff neck.   You have very bad joint pains   or stiffness.   You have jerky movements that you cannot control (seizure).   Your rash covers all or most of your body. The rash may or may not be painful.   You have blisters that:  ? Are on top of the rash.  ? Grow larger.  ? Grow together.  ? Are painful.  ? Are inside your nose or mouth.   You have a rash that:  ? Looks like purple pinprick-sized spots all over your body.  ? Has a "bull's eye" or looks like a target.  ? Is red and painful, causes your skin to peel, and is not from being in the sun too long.  Summary   A rash is a change in the color of your skin. A rash can also change the way your skin feels.   The goal of treatment is to stop the itching and keep the rash from spreading.   Take or apply over-the-counter and prescription medicines only as told by your doctor.   Contact a doctor if you have new symptoms or symptoms that get worse.   Keep all follow-up visits as told by your doctor. This is  important.  This information is not intended to replace advice given to you by your health care provider. Make sure you discuss any questions you have with your health care provider.  Document Released: 11/29/2007 Document Revised: 01/14/2018 Document Reviewed: 01/14/2018  [employer]vier Interactive Patient Education  2019 [employer]

## 2018-07-01 NOTE — Progress Notes (Signed)
Subjective:    Patient ID: Marvin Frost, male    DOB: [birth year], 73 y.o.   MRN: [id number]  Chief Complaint:  Rash (itchy rash on arms and legs, started after cardiologist started him on Brilinta and Lopressor; patient remembers years ago having similar rash after starting beta blocker)   HPI: Marvin Frost is a 73 y.o. male presenting on 07/01/2018 for Rash (itchy rash on arms and legs, started after cardiologist started him on Brilinta and Lopressor; patient remembers years ago having similar rash after starting beta blocker)   1. Pruritic rash   Pt presents today with a 3 week history of a pruritic rash. Pt states this started after the initiation of Lopressor and Brilinta. Pt states he had this issue over 20 years ago after starting a BB, but is unsure of the BB he was on. States the rash is mostly on his thighs, back of arms, and lower legs. Pt states the pruritis is worse at night. States it woke him from his sleep last night. He denies shortness of breath, throat swelling, trouble swallowing, chest tightness, or dyspnea. States he has not tried anything for the rash.   Relevant past medical, surgical, family, and social history reviewed and updated as indicated.  Allergies and medications reviewed and updated.   Past Medical History:  Diagnosis Date  . Allergy   . Arthritis   . CAD (coronary artery disease)    Nonobstructive Cath 2004  . 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 Cataract   . Diabetes mellitus   . Dysrhythmia    history of heart palpitations   . Gastric polyps 09/18/2017  . GERD (gastroesophageal reflux disease)   . History of frequent urinary tract infections   . History of hiatal hernia   . Hyperlipemia   . Hypertension   . Obesity   . Pancreatitis     Past Surgical History:  Procedure Laterality Date  . CARDIAC CATHETERIZATION     2004 and 1999- done by Dr Percival Spanish   . COLONOSCOPY  11/2003   diverticulosis, no polyps  .  CORONARY STENT INTERVENTION N/A 05/28/2018   Procedure: CORONARY STENT INTERVENTION;  Surgeon: Sherren Mocha, MD;  Location: [facility];  Service: Cardiovascular;  Laterality: N/A;  . EYE SURGERY Bilateral    cateracts - dr Katy Fitch  . HYDROCELE EXCISION / REPAIR    . INTRAVASCULAR PRESSURE WIRE/FFR STUDY N/A 05/28/2018   Procedure: INTRAVASCULAR PRESSURE WIRE/FFR STUDY;  Surgeon: Sherren Mocha, MD;  Location: [facility];  Service: Cardiovascular;  Laterality: N/A;  . left calf surgery      related to trauma of being torn   . LEFT HEART CATH AND CORONARY ANGIOGRAPHY N/A 05/28/2018   Procedure: LEFT HEART CATH AND CORONARY ANGIOGRAPHY;  Surgeon: Sherren Mocha, MD;  Location: [facility];  Service: Cardiovascular;  Laterality: N/A;  . left index finger arthrioscopic surgery     . lymph node removed from right neck     . right shoulder surgery    . TOTAL HIP ARTHROPLASTY Right 03/16/2015   Procedure: RIGHT TOTAL HIP ARTHROPLASTY ANTERIOR APPROACH;  Surgeon: Paralee Cancel, MD;  Location: [facility];  Service: Orthopedics;  Laterality: Right;  . TOTAL HIP ARTHROPLASTY Left 06/22/2015   Procedure: LEFT TOTAL HIP ARTHROPLASTY ANTERIOR APPROACH;  Surgeon: Paralee Cancel, MD;  Location: [facility];  Service: Orthopedics;  Laterality: Left;  . UPPER GASTROINTESTINAL ENDOSCOPY  08/19/2010   hiatal hernia 2cm, mild gastritis    Social History   Socioeconomic History  . Marital status: Married    Spouse name: Not on file  . Number of children: 1  . Years of education: Not on file  . Highest education level: Not on file  Occupational History  . Occupation: Best boy: [employer]    Comment: retired  Scientific laboratory technician  . Financial resource strain: Not hard at all  . Food insecurity:    Worry: Never true    Inability: Never true  . Transportation needs:    Medical: No    Non-medical: No  Tobacco Use  . Smoking status: Former Smoker    Packs/day: 1.00    Years: 12.00      Pack years: 12.00    Types: Cigarettes    Last attempt to quit: 02/05/1984    Years since quitting: 34.4  . Smokeless tobacco: Former Systems developer    Types: [place] date: 06/26/1978  Substance and Sexual Activity  . Alcohol use: No  . Drug use: No  . Sexual activity: Yes  Lifestyle  . Physical activity:    Days per week: 5 days    Minutes per session: 30 min  . Stress: Not at all  Relationships  . Social connections:    Talks on phone: More than three times a week    Gets together: More than three times a week    Attends religious service: More than 4 times per year    Active member of club or organization: Yes    Attends meetings of clubs or organizations: More than 4 times per year    Relationship status: Married  . Intimate partner violence:    Fear of current or ex partner: No    Emotionally abused: No    Physically abused: No    Forced sexual activity: No  Other Topics Concern  . Not on file  Social History Narrative   Married   Retired from [employer]   1 child   Former but no current tobacco   No EtOH/drugs    Outpatient Encounter Medications as of 07/01/2018  Medication Sig  . amoxicillin (AMOXIL) 500 MG capsule Take 2,000 mg by mouth See admin instructions. Take four capsules (2000 mg) by mouth one hour prior to dental appointments  . aspirin EC 81 MG EC tablet Take 1 tablet (81 mg total) by mouth daily.  Marland Kitchen BENICAR HCT 40-25 MG tablet TAKE 1 TABLET DAILY (Patient taking differently: Take 1 tablet by mouth daily. )  . Blood Glucose Monitoring Suppl (FREESTYLE LITE) DEVI Use to check BS bid. DX. E11.9  . cetirizine (ZYRTEC) 10 MG tablet Take 10 mg by mouth daily as needed for allergies or rhinitis.   Marland Kitchen diclofenac (VOLTAREN) 75 MG EC tablet Take 1 tablet (75 mg total) by mouth 2 (two) times daily.  . Empagliflozin-metFORMIN HCl (SYNJARDY) 12.10-998 MG TABS Take 1 tablet by mouth 2 (two) times daily.  Marland Kitchen FeFum-FePoly-FA-B Cmp-C-Biot (INTEGRA PLUS) CAPS Take 1  capsule by mouth daily. (Patient taking differently: Take 1 capsule by mouth daily as needed (energy). )  . ferrous sulfate 325 (65 FE) MG tablet Take 1 tablet (325 mg total) by mouth every other day.  Marland Kitchen glucose blood (FREESTYLE LITE) test strip USE TO CHECK BLOOD SUGAR TWICE A DAY AND AS NEEDED  . Icosapent Ethyl (VASCEPA) 1 g CAPS Take 2 capsules by mouth 2 (two) times  daily.  . insulin aspart (NOVOLOG FLEXPEN) 100 UNIT/ML FlexPen INJECT 16 TO 20 UNITS UNDER THE SKIN TWICE A DAY (Patient taking differently: Inject 10-22 Units into the skin See admin instructions. Inject 22 units subcutaneously before breakfast and after supper, inject 10-15 units at bedtime per sliding scale: CBG <200 10 - 12 units, >200 15 units)  . Insulin Degludec (TRESIBA FLEXTOUCH) 200 UNIT/ML SOPN Inject 60-70 Units into the skin daily. (Patient taking differently: Inject 72 Units into the skin at bedtime. )  . Insulin Pen Needle (NOVOTWIST) 32G X 5 MM MISC USE WITH INSULIN PENS UP TO 5 TIMES DAILY  . Lancets (FREESTYLE) lancets Check BS BID and PRN. DX.E11.9  . metoprolol tartrate (LOPRESSOR) 50 MG tablet Take 1 tablet (50 mg total) by mouth 2 (two) times daily.  . nitroGLYCERIN (NITROSTAT) 0.4 MG SL tablet Place 1 tablet (0.4 mg total) under the tongue every 5 (five) minutes as needed for chest pain.  . pantoprazole (PROTONIX) 40 MG tablet TAKE 1 TABLET TWICE A DAY (Patient taking differently: Take 40 mg by mouth 2 (two) times daily after a meal. )  . REPATHA SURECLICK 098 MG/ML SOAJ INJECT 140 MG UNDER THE SKIN EVERY 14 DAYS (Patient taking differently: Inject 140 mg into the skin every 30 (thirty) days. )  . rosuvastatin (CRESTOR) 20 MG tablet Take 1 tablet (20 mg total) by mouth daily.  . ticagrelor (BRILINTA) 90 MG TABS tablet Take 1 tablet (90 mg total) by mouth 2 (two) times daily.  . hydrOXYzine (ATARAX/VISTARIL) 50 MG tablet Take 1 tablet (50 mg total) by mouth 3 (three) times daily as needed.   No  facility-administered encounter medications on file as of 07/01/2018.     Allergies  Allergen Reactions  . Liraglutide Other (See Comments)    Abdominal pain and enlarged pancreas on CT ? pancreatitis  . Baxinets [Cetylpyridinium-Benzocaine] Other (See Comments)    Unknown reaction  . Celebrex [Celecoxib] Nausea Only  . Toprol Xl [Metoprolol Succinate] Other (See Comments)    Unknown reaction - pt states it did not work  . Triplex Ad Other (See Comments)    Abdominal pain and enlarged pancreas on CT? pancreatitis  . Welchol [Colesevelam Hcl] Other (See Comments)    constipation  . Norvasc [Amlodipine Besylate] Rash  . Statins Other (See Comments)    muscle and joint pain if taken for long periods of time, takes Crestor every 2-3 days to avoid this reaction    Review of Systems  Constitutional: Negative for chills, fatigue and fever.  HENT: Negative for trouble swallowing and voice change.   Respiratory: Negative for cough, choking, chest tightness, shortness of breath, wheezing and stridor.   Cardiovascular: Negative for chest pain and palpitations.  Skin: Positive for rash.  Neurological: Negative for dizziness and light-headedness.  Psychiatric/Behavioral: Negative for confusion.  All other systems reviewed and are negative.       Objective:    BP 139/75   Pulse 82   Temp (!) 97.1 F (36.2 C) (Oral)   Ht 5\' 8"  (1.727 m)   Wt 226 lb (102.5 kg)   BMI 34.36 kg/m    Wt Readings from Last 3 Encounters:  07/01/18 226 lb (102.5 kg)  06/10/18 223 lb 6.4 oz (101.3 kg)  05/27/18 222 lb 0.1 oz (100.7 kg)    Physical Exam Vitals signs and nursing note reviewed.  Constitutional:      General: He is not in acute distress.    Appearance: Normal appearance.  He is obese. He is not ill-appearing.  HENT:     Head: Normocephalic and atraumatic.     Mouth/Throat:     Mouth: Mucous membranes are moist.     Pharynx: Oropharynx is clear.  Eyes:     Conjunctiva/sclera:  Conjunctivae normal.     Pupils: Pupils are equal, round, and reactive to light.  Cardiovascular:     Rate and Rhythm: Normal rate and regular rhythm.     Heart sounds: Normal heart sounds. No murmur. No friction rub. No gallop.   Pulmonary:     Effort: Pulmonary effort is normal.     Breath sounds: Normal breath sounds. No stridor. No wheezing.  Skin:    General: Skin is warm and dry.     Capillary Refill: Capillary refill takes less than 2 seconds.     Coloration: Skin is not cyanotic.     Findings: Rash present. Rash is papular.       Neurological:     Mental Status: He is alert and oriented to person, place, and time.  Psychiatric:        Mood and Affect: Mood normal.        Behavior: Behavior normal.        Thought Content: Thought content normal.        Judgment: Judgment normal.     Results for orders placed or performed during the hospital encounter of 05/27/18  MRSA PCR Screening  Result Value Ref Range   MRSA by PCR NEGATIVE NEGATIVE  Basic metabolic panel  Result Value Ref Range   Sodium 137 135 - 145 mmol/L   Potassium 3.6 3.5 - 5.1 mmol/L   Chloride 101 98 - 111 mmol/L   CO2 20 (L) 22 - 32 mmol/L   Glucose, Bld 86 70 - 99 mg/dL   BUN 22 8 - 23 mg/dL   Creatinine, Ser 1.09 0.61 - 1.24 mg/dL   Calcium 8.7 (L) 8.9 - 10.3 mg/dL   GFR calc non Af Amer >60 >60 mL/min   GFR calc Af Amer >60 >60 mL/min   Anion gap 16 (H) 5 - 15  CBC  Result Value Ref Range   WBC 11.5 (H) 4.0 - 10.5 K/uL   RBC 4.98 4.22 - 5.81 MIL/uL   Hemoglobin 9.2 (L) 13.0 - 17.0 g/dL   HCT 35.4 (L) 39.0 - 52.0 %   MCV 71.1 (L) 80.0 - 100.0 fL   MCH 18.5 (L) 26.0 - 34.0 pg   MCHC 26.0 (L) 30.0 - 36.0 g/dL   RDW 24.4 (H) 11.5 - 15.5 %   Platelets 358 150 - 400 K/uL   nRBC 0.0 0.0 - 0.2 %  Brain natriuretic peptide  Result Value Ref Range   B Natriuretic Peptide 9.6 0.0 - 100.0 pg/mL  Protime-INR  Result Value Ref Range   Prothrombin Time 13.2 11.4 - 15.2 seconds   INR 8.36   Basic  metabolic panel  Result Value Ref Range   Sodium 137 135 - 145 mmol/L   Potassium 3.4 (L) 3.5 - 5.1 mmol/L   Chloride 102 98 - 111 mmol/L   CO2 23 22 - 32 mmol/L   Glucose, Bld 154 (H) 70 - 99 mg/dL   BUN 21 8 - 23 mg/dL   Creatinine, Ser 1.12 0.61 - 1.24 mg/dL   Calcium 8.9 8.9 - 10.3 mg/dL   GFR calc non Af Amer >60 >60 mL/min   GFR calc Af Amer >60 >60 mL/min   Anion  gap 12 5 - 15  CBC  Result Value Ref Range   WBC 10.8 (H) 4.0 - 10.5 K/uL   RBC 4.87 4.22 - 5.81 MIL/uL   Hemoglobin 9.1 (L) 13.0 - 17.0 g/dL   HCT 33.6 (L) 39.0 - 52.0 %   MCV 69.0 (L) 80.0 - 100.0 fL   MCH 18.7 (L) 26.0 - 34.0 pg   MCHC 27.1 (L) 30.0 - 36.0 g/dL   RDW 24.1 (H) 11.5 - 15.5 %   Platelets 320 150 - 400 K/uL   nRBC 0.0 0.0 - 0.2 %  Heparin level (unfractionated)  Result Value Ref Range   Heparin Unfractionated 0.15 (L) 0.30 - 0.70 IU/mL  Occult blood card to lab, stool  Result Value Ref Range   Fecal Occult Bld NEGATIVE NEGATIVE  Troponin I - Now Then Q6H  Result Value Ref Range   Troponin I <0.03 <0.03 ng/mL  Troponin I - Now Then Q6H  Result Value Ref Range   Troponin I 0.10 (HH) <0.03 ng/mL  Troponin I - Now Then Q6H  Result Value Ref Range   Troponin I <0.03 <0.03 ng/mL  Glucose, capillary  Result Value Ref Range   Glucose-Capillary 171 (H) 70 - 99 mg/dL  Heparin level (unfractionated)  Result Value Ref Range   Heparin Unfractionated 0.38 0.30 - 0.70 IU/mL  Glucose, capillary  Result Value Ref Range   Glucose-Capillary 142 (H) 70 - 99 mg/dL  Glucose, capillary  Result Value Ref Range   Glucose-Capillary 139 (H) 70 - 99 mg/dL  Basic metabolic panel  Result Value Ref Range   Sodium 137 135 - 145 mmol/L   Potassium 3.7 3.5 - 5.1 mmol/L   Chloride 102 98 - 111 mmol/L   CO2 21 (L) 22 - 32 mmol/L   Glucose, Bld 154 (H) 70 - 99 mg/dL   BUN 13 8 - 23 mg/dL   Creatinine, Ser 1.07 0.61 - 1.24 mg/dL   Calcium 8.8 (L) 8.9 - 10.3 mg/dL   GFR calc non Af Amer >60 >60 mL/min   GFR  calc Af Amer >60 >60 mL/min   Anion gap 14 5 - 15  CBC  Result Value Ref Range   WBC 9.6 4.0 - 10.5 K/uL   RBC 4.92 4.22 - 5.81 MIL/uL   Hemoglobin 9.1 (L) 13.0 - 17.0 g/dL   HCT 34.6 (L) 39.0 - 52.0 %   MCV 70.3 (L) 80.0 - 100.0 fL   MCH 18.5 (L) 26.0 - 34.0 pg   MCHC 26.3 (L) 30.0 - 36.0 g/dL   RDW 24.6 (H) 11.5 - 15.5 %   Platelets 327 150 - 400 K/uL   nRBC 0.0 0.0 - 0.2 %  Glucose, capillary  Result Value Ref Range   Glucose-Capillary 109 (H) 70 - 99 mg/dL  Glucose, capillary  Result Value Ref Range   Glucose-Capillary 184 (H) 70 - 99 mg/dL   Comment 1 Notify RN   Glucose, capillary  Result Value Ref Range   Glucose-Capillary 166 (H) 70 - 99 mg/dL   Comment 1 Notify RN    Comment 2 Document in Chart   Glucose, capillary  Result Value Ref Range   Glucose-Capillary 175 (H) 70 - 99 mg/dL   Comment 1 Notify RN    Comment 2 Document in Chart   I-stat troponin, ED (not at [facility])  Result Value Ref Range   Troponin i, poc 0.00 0.00 - 0.08 ng/mL   Comment 3  I-Stat Chem 8, ED  Result Value Ref Range   Sodium 138 135 - 145 mmol/L   Potassium 3.5 3.5 - 5.1 mmol/L   Chloride 106 98 - 111 mmol/L   BUN 21 8 - 23 mg/dL   Creatinine, Ser 1.10 0.61 - 1.24 mg/dL   Glucose, Bld 103 (H) 70 - 99 mg/dL   Calcium, Ion 1.05 (L) 1.15 - 1.40 mmol/L   TCO2 25 22 - 32 mmol/L   Hemoglobin 11.2 (L) 13.0 - 17.0 g/dL   HCT 33.0 (L) 39.0 - 52.0 %  POCT Activated clotting time  Result Value Ref Range   Activated Clotting Time 285 seconds  POCT Activated clotting time  Result Value Ref Range   Activated Clotting Time 268 seconds       Pertinent labs & imaging results that were available during my care of the patient were reviewed by me and considered in my medical decision making.  Assessment & Plan:  Dequavion was seen today for rash.  Diagnoses and all orders for this visit:  Pruritic rash Pruritic rash 3 weeks after initiation of Lopressor and Brilinta. No anaphylactic symptoms.  Pt has an appointment with cardiology this week. Will not change therapy today, will [facility] cardiology. Will trial atarax for pruritis. Pt to report any new or worsening symptoms.  -     hydrOXYzine (ATARAX/VISTARIL) 50 MG tablet; Take 1 tablet (50 mg total) by mouth 3 (three) times daily as needed.    Continue all other maintenance medications.  Follow up plan: Return in about 1 week (around 07/08/2018), or if symptoms worsen or fail to improve.  Educational handout given for rash  The above assessment and management plan was discussed with the patient. The patient verbalized understanding of and has agreed to the management plan. Patient is aware to call the clinic if symptoms persist or worsen. Patient is aware when to return to the clinic for a follow-up visit. Patient educated on when it is appropriate to go to the emergency department.   Monia Pouch, FNP-C [facility] [phone]

## 2018-07-07 ENCOUNTER — Encounter: Payer: Self-pay | Admitting: Cardiology

## 2018-07-07 NOTE — Progress Notes (Signed)
HPI  The patient presents for follow up of nonobstructive CAD.  His last cath was 2004. His last stress test in July of 2018.  In was in the hospital in Dec with unstable angina.  He had DES stenting to the LAD.  He had disease as listed below.      Since I last saw him he has had some rash that was on his back and his arms.  He says this is slowly improved.  He also has had some shortness of breath.  He said this started shortly after he began the [place].  He does not notice any PND or orthopnea although he thinks he feels like he cannot take a deep breath sometimes at night when he is lying flat.  He is having none of the chest discomfort that was his previous angina.  He has started some walking.   He does report is had some black stools.  He had gastric polyps at one point.  He had blood drawn today but I do not have these results yet.  He is going to follow-up with Dr. Laurance Flatten apparently tomorrow.   Allergies  Allergen Reactions  . Liraglutide Other (See Comments)    Abdominal pain and enlarged pancreas on CT ? pancreatitis  . Baxinets [Cetylpyridinium-Benzocaine] Other (See Comments)    Unknown reaction  . Celebrex [Celecoxib] Nausea Only  . Toprol Xl [Metoprolol Succinate] Other (See Comments)    Unknown reaction - pt states it did not work  . Triplex Ad Other (See Comments)    Abdominal pain and enlarged pancreas on CT? pancreatitis  . Welchol [Colesevelam Hcl] Other (See Comments)    constipation  . Norvasc [Amlodipine Besylate] Rash  . Statins Other (See Comments)    muscle and joint pain if taken for long periods of time, takes Crestor every 2-3 days to avoid this reaction    Current Outpatient Medications  Medication Sig Dispense Refill  . amoxicillin (AMOXIL) 500 MG capsule Take 2,000 mg by mouth See admin instructions. Take four capsules (2000 mg) by mouth one hour prior to dental appointments    . aspirin EC 81 MG EC tablet Take 1 tablet (81 mg total) by mouth  daily.    Marland Kitchen BENICAR HCT 40-25 MG tablet TAKE 1 TABLET DAILY (Patient taking differently: Take 1 tablet by mouth daily. ) 90 tablet 1  . Blood Glucose Monitoring Suppl (FREESTYLE LITE) DEVI Use to check BS bid. DX. E11.9 1 each 0  . cetirizine (ZYRTEC) 10 MG tablet Take 10 mg by mouth daily as needed for allergies or rhinitis.     Marland Kitchen diclofenac (VOLTAREN) 75 MG EC tablet Take 1 tablet (75 mg total) by mouth 2 (two) times daily. 30 tablet 0  . Empagliflozin-metFORMIN HCl (SYNJARDY) 12.10-998 MG TABS Take 1 tablet by mouth 2 (two) times daily. 180 tablet 3  . FeFum-FePoly-FA-B Cmp-C-Biot (INTEGRA PLUS) CAPS Take 1 capsule by mouth daily. (Patient taking differently: Take 1 capsule by mouth daily as needed (energy). ) 90 capsule 1  . ferrous sulfate 325 (65 FE) MG tablet Take 1 tablet (325 mg total) by mouth every other day.  3  . glucose blood (FREESTYLE LITE) test strip USE TO CHECK BLOOD SUGAR TWICE A DAY AND AS NEEDED 300 each 3  . hydrOXYzine (ATARAX/VISTARIL) 50 MG tablet Take 1 tablet (50 mg total) by mouth 3 (three) times daily as needed. 30 tablet 1  . insulin aspart (NOVOLOG FLEXPEN) 100 UNIT/ML FlexPen INJECT  16 TO 20 UNITS UNDER THE SKIN TWICE A DAY (Patient taking differently: Inject 10-22 Units into the skin See admin instructions. Inject 22 units subcutaneously before breakfast and after supper, inject 10-15 units at bedtime per sliding scale: CBG <200 10 - 12 units, >200 15 units) 45 mL 3  . Insulin Degludec (TRESIBA FLEXTOUCH) 200 UNIT/ML SOPN Inject 60-70 Units into the skin daily. (Patient taking differently: Inject 72 Units into the skin at bedtime. ) 60 mL 3  . Insulin Pen Needle (NOVOTWIST) 32G X 5 MM MISC USE WITH INSULIN PENS UP TO 5 TIMES DAILY 300 each 9  . Lancets (FREESTYLE) lancets Check BS BID and PRN. DX.E11.9 300 each 3  . metoprolol tartrate (LOPRESSOR) 50 MG tablet Take 1 tablet (50 mg total) by mouth 2 (two) times daily. 180 tablet 3  . nitroGLYCERIN (NITROSTAT) 0.4 MG SL  tablet Place 1 tablet (0.4 mg total) under the tongue every 5 (five) minutes as needed for chest pain. 25 tablet 1  . pantoprazole (PROTONIX) 40 MG tablet TAKE 1 TABLET TWICE A DAY (Patient taking differently: Take 40 mg by mouth 2 (two) times daily after a meal. ) 180 tablet 0  . REPATHA SURECLICK 425 MG/ML SOAJ INJECT 140 MG UNDER THE SKIN EVERY 14 DAYS (Patient taking differently: Inject 140 mg into the skin every 30 (thirty) days. ) 6 mL 4  . rosuvastatin (CRESTOR) 20 MG tablet Take 1 tablet (20 mg total) by mouth daily. (Patient taking differently: Take 20 mg by mouth every other day. Taking every 3 days) 90 tablet 4  . clopidogrel (PLAVIX) 75 MG tablet Take 1 tablet (75 mg total) by mouth daily. 90 tablet 3   No current facility-administered medications for this visit.     Past Medical History:  Diagnosis Date  . Allergy   . Arthritis   . CAD (coronary artery disease)   . 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 Cataract   . Diabetes mellitus   . Dysrhythmia    history of heart palpitations   . Gastric polyps 09/18/2017  . GERD (gastroesophageal reflux disease)   . History of frequent urinary tract infections   . History of hiatal hernia   . Hyperlipemia   . Hypertension   . Obesity   . Pancreatitis     Past Surgical History:  Procedure Laterality Date  . CARDIAC CATHETERIZATION     2004 and 1999- done by Dr Percival Spanish   . COLONOSCOPY  11/2003   diverticulosis, no polyps  . CORONARY STENT INTERVENTION N/A 05/28/2018   Procedure: CORONARY STENT INTERVENTION;  Surgeon: Sherren Mocha, MD;  Location: [facility];  Service: Cardiovascular;  Laterality: N/A;  . EYE SURGERY Bilateral    cateracts - dr Katy Fitch  . HYDROCELE EXCISION / REPAIR    . INTRAVASCULAR PRESSURE WIRE/FFR STUDY N/A 05/28/2018   Procedure: INTRAVASCULAR PRESSURE WIRE/FFR STUDY;  Surgeon: Sherren Mocha, MD;  Location: [facility];  Service: Cardiovascular;  Laterality:  N/A;  . left calf surgery      related to trauma of being torn   . LEFT HEART CATH AND CORONARY ANGIOGRAPHY N/A 05/28/2018   Procedure: LEFT HEART CATH AND CORONARY ANGIOGRAPHY;  Surgeon: Sherren Mocha, MD;  Location: [facility];  Service: Cardiovascular;  Laterality: N/A;  . left index finger arthrioscopic surgery     . lymph node removed from right neck     .  right shoulder surgery    . TOTAL HIP ARTHROPLASTY Right 03/16/2015   Procedure: RIGHT TOTAL HIP ARTHROPLASTY ANTERIOR APPROACH;  Surgeon: Paralee Cancel, MD;  Location: [facility];  Service: Orthopedics;  Laterality: Right;  . TOTAL HIP ARTHROPLASTY Left 06/22/2015   Procedure: LEFT TOTAL HIP ARTHROPLASTY ANTERIOR APPROACH;  Surgeon: Paralee Cancel, MD;  Location: [facility];  Service: Orthopedics;  Laterality: Left;  . UPPER GASTROINTESTINAL ENDOSCOPY  08/19/2010   hiatal hernia 2cm, mild gastritis    ROS:    As stated in the HPI and negative for all other systems.  PHYSICAL EXAM BP 130/76   Pulse 84   Ht 5\' 8"  (1.727 m)   Wt 225 lb (102.1 kg)   BMI 34.21 kg/m   GENERAL:  Well appearing NECK:  No jugular venous distention, waveform within normal limits, carotid upstroke brisk and symmetric, no bruits, no thyromegaly LUNGS:  Clear to auscultation bilaterally CHEST:  Unremarkable HEART:  PMI not displaced or sustained,S1 and S2 within normal limits, no S3, no S4, no clicks, no rubs, no murmurs ABD:  Flat, positive bowel sounds normal in frequency in pitch, no bruits, no rebound, no guarding, no midline pulsatile mass, no hepatomegaly, no splenomegaly EXT:  2 plus pulses throughout, no edema, no cyanosis no clubbing SKIN: Slight punctate lesions down around the knees.   Lab Results  Component Value Date   CHOL 78 (L) 02/19/2018   TRIG 155 (H) 02/19/2018   HDL 38 (L) 02/19/2018   LDLCALC 9 02/19/2018   LDLDIRECT 39 11/02/2016   Lab Results  Component Value Date   HGBA1C 7.1 (H) 07/09/2018    EKG: NA  Diagnostic    Dominance: Left    Intervention       ASSESSMENT AND PLAN  CAD-  The patient is done well with resolution of his symptoms with PCI.  At this point I do think he is probably having some dyspnea related to Brilinta and probably had a rash related to this.  I am to switch him to Plavix.  He will continue with other meds as listed.   HYPERLIPIDEMIA -  His LDL is 9 although there is another level drawn today that is pending.  I will defer management to Chipper Herb, MD  HYPERTENSION -  The blood pressure is at target. No change in medications is indicated. We will continue with therapeutic lifestyle changes (TLC).I reviewed her blood pressure diary.  No change in therapy.  DM -  A1c was 7.1 which is improved from his previous of 7.8.  He will continue the meds as listed.

## 2018-07-09 ENCOUNTER — Encounter: Payer: Self-pay | Admitting: Cardiology

## 2018-07-09 ENCOUNTER — Other Ambulatory Visit: Payer: Medicare Other

## 2018-07-09 ENCOUNTER — Ambulatory Visit (INDEPENDENT_AMBULATORY_CARE_PROVIDER_SITE_OTHER): Payer: Medicare Other | Admitting: Cardiology

## 2018-07-09 VITALS — BP 130/76 | HR 84 | Ht 68.0 in | Wt 225.0 lb

## 2018-07-09 DIAGNOSIS — E1159 Type 2 diabetes mellitus with other circulatory complications: Secondary | ICD-10-CM | POA: Diagnosis not present

## 2018-07-09 DIAGNOSIS — I2510 Atherosclerotic heart disease of native coronary artery without angina pectoris: Secondary | ICD-10-CM

## 2018-07-09 DIAGNOSIS — I10 Essential (primary) hypertension: Secondary | ICD-10-CM

## 2018-07-09 DIAGNOSIS — E785 Hyperlipidemia, unspecified: Secondary | ICD-10-CM

## 2018-07-09 DIAGNOSIS — E1169 Type 2 diabetes mellitus with other specified complication: Secondary | ICD-10-CM

## 2018-07-09 DIAGNOSIS — R7989 Other specified abnormal findings of blood chemistry: Secondary | ICD-10-CM

## 2018-07-09 DIAGNOSIS — K219 Gastro-esophageal reflux disease without esophagitis: Secondary | ICD-10-CM | POA: Diagnosis not present

## 2018-07-09 DIAGNOSIS — N400 Enlarged prostate without lower urinary tract symptoms: Secondary | ICD-10-CM

## 2018-07-09 LAB — BAYER DCA HB A1C WAIVED: HB A1C (BAYER DCA - WAIVED): 7.1 % — ABNORMAL HIGH (ref <7.0)

## 2018-07-09 MED ORDER — CLOPIDOGREL BISULFATE 75 MG PO TABS: 75.0000 mg | ORAL_TABLET | Freq: Every day | ORAL | 3 refills | Status: DC

## 2018-07-09 NOTE — Patient Instructions (Signed)
Medication Instructions:  STOP- Brilinta START- Plavix 75 mg daily  If you need a refill on your cardiac medications before your next appointment, please call your pharmacy.  Labwork: None Ordered   Take the provided lab slips with you to the lab for your blood draw.  When you have your labs (blood work) drawn today and your tests are completely normal, you will receive your results only by MyChart Message (if you have MyChart) -OR-  A paper copy in the mail.  If you have any lab test that is abnormal or we need to change your treatment, we will call you to review these results.  Testing/Procedures: None Ordered  Follow-Up: You will need a follow up appointment in 4 months.  Please call our office 2 months in advance to schedule this appointment.  You may see Minus Breeding, MD or one of the following Advanced Practice Providers on your designated Care Team:   Rosaria Ferries, PA-C . Jory Sims, DNP, ANP    At [facility], you and your health needs are our priority.  As part of our continuing mission to provide you with exceptional heart care, we have created designated Provider Care Teams.  These Care Teams include your primary Cardiologist (physician) and Advanced Practice Providers (APPs -  Physician Assistants and Nurse Practitioners) who all work together to provide you with the care you need, when you need it.  Thank you for choosing [facility] at [facility]!!

## 2018-07-10 ENCOUNTER — Ambulatory Visit (INDEPENDENT_AMBULATORY_CARE_PROVIDER_SITE_OTHER): Payer: Medicare Other | Admitting: Family Medicine

## 2018-07-10 ENCOUNTER — Encounter: Payer: Self-pay | Admitting: Family Medicine

## 2018-07-10 VITALS — BP 119/64 | HR 77 | Ht 68.0 in | Wt 225.0 lb

## 2018-07-10 DIAGNOSIS — K219 Gastro-esophageal reflux disease without esophagitis: Secondary | ICD-10-CM

## 2018-07-10 DIAGNOSIS — E1159 Type 2 diabetes mellitus with other circulatory complications: Secondary | ICD-10-CM | POA: Diagnosis not present

## 2018-07-10 DIAGNOSIS — E1169 Type 2 diabetes mellitus with other specified complication: Secondary | ICD-10-CM | POA: Diagnosis not present

## 2018-07-10 DIAGNOSIS — E782 Mixed hyperlipidemia: Secondary | ICD-10-CM | POA: Diagnosis not present

## 2018-07-10 DIAGNOSIS — E785 Hyperlipidemia, unspecified: Secondary | ICD-10-CM

## 2018-07-10 DIAGNOSIS — D649 Anemia, unspecified: Secondary | ICD-10-CM

## 2018-07-10 DIAGNOSIS — R710 Precipitous drop in hematocrit: Secondary | ICD-10-CM | POA: Diagnosis not present

## 2018-07-10 DIAGNOSIS — K317 Polyp of stomach and duodenum: Secondary | ICD-10-CM

## 2018-07-10 DIAGNOSIS — E119 Type 2 diabetes mellitus without complications: Secondary | ICD-10-CM

## 2018-07-10 DIAGNOSIS — I2510 Atherosclerotic heart disease of native coronary artery without angina pectoris: Secondary | ICD-10-CM | POA: Diagnosis not present

## 2018-07-10 DIAGNOSIS — R7989 Other specified abnormal findings of blood chemistry: Secondary | ICD-10-CM

## 2018-07-10 LAB — CBC WITH DIFFERENTIAL/PLATELET
Basophils Absolute: 0.2 10*3/uL (ref 0.0–0.2)
Basos: 2 %
EOS (ABSOLUTE): 0.7 10*3/uL — ABNORMAL HIGH (ref 0.0–0.4)
EOS: 7 %
Hematocrit: 32.7 % — ABNORMAL LOW (ref 37.5–51.0)
Hemoglobin: 9.1 g/dL — ABNORMAL LOW (ref 13.0–17.7)
Immature Grans (Abs): 0.1 10*3/uL (ref 0.0–0.1)
Immature Granulocytes: 1 %
Lymphocytes Absolute: 1.3 10*3/uL (ref 0.7–3.1)
Lymphs: 13 %
MCH: 19 pg — ABNORMAL LOW (ref 26.6–33.0)
MCHC: 27.8 g/dL — ABNORMAL LOW (ref 31.5–35.7)
MCV: 68 fL — ABNORMAL LOW (ref 79–97)
MONOCYTES: 7 %
Monocytes Absolute: 0.7 10*3/uL (ref 0.1–0.9)
Neutrophils Absolute: 6.7 10*3/uL (ref 1.4–7.0)
Neutrophils: 70 %
Platelets: 517 10*3/uL — ABNORMAL HIGH (ref 150–450)
RBC: 4.8 x10E6/uL (ref 4.14–5.80)
RDW: 22.3 % — ABNORMAL HIGH (ref 11.6–15.4)
WBC: 9.6 10*3/uL (ref 3.4–10.8)

## 2018-07-10 LAB — HEPATIC FUNCTION PANEL
ALT: 21 IU/L (ref 0–44)
AST: 20 IU/L (ref 0–40)
Albumin: 4.2 g/dL (ref 3.5–4.8)
Alkaline Phosphatase: 55 IU/L (ref 39–117)
BILIRUBIN, DIRECT: 0.1 mg/dL (ref 0.00–0.40)
Bilirubin Total: 0.3 mg/dL (ref 0.0–1.2)
Total Protein: 6.6 g/dL (ref 6.0–8.5)

## 2018-07-10 LAB — LIPID PANEL
Chol/HDL Ratio: 4.7 ratio (ref 0.0–5.0)
Cholesterol, Total: 184 mg/dL (ref 100–199)
HDL: 39 mg/dL — ABNORMAL LOW
LDL Calculated: 98 mg/dL (ref 0–99)
Triglycerides: 237 mg/dL — ABNORMAL HIGH (ref 0–149)
VLDL Cholesterol Cal: 47 mg/dL — ABNORMAL HIGH (ref 5–40)

## 2018-07-10 LAB — BMP8+EGFR
BUN/Creatinine Ratio: 9 — ABNORMAL LOW (ref 10–24)
BUN: 10 mg/dL (ref 8–27)
CO2: 22 mmol/L (ref 20–29)
Calcium: 9.2 mg/dL (ref 8.6–10.2)
Chloride: 97 mmol/L (ref 96–106)
Creatinine, Ser: 1.07 mg/dL (ref 0.76–1.27)
GFR calc Af Amer: 79 mL/min/1.73
GFR calc non Af Amer: 68 mL/min/1.73
Glucose: 180 mg/dL — ABNORMAL HIGH (ref 65–99)
Potassium: 4.2 mmol/L (ref 3.5–5.2)
Sodium: 136 mmol/L (ref 134–144)

## 2018-07-10 LAB — VITAMIN D 25 HYDROXY (VIT D DEFICIENCY, FRACTURES): Vit D, 25-Hydroxy: 29.4 ng/mL — ABNORMAL LOW (ref 30.0–100.0)

## 2018-07-10 MED ORDER — AMOXICILLIN 500 MG PO CAPS: 2000.0000 mg | ORAL_CAPSULE | ORAL | 5 refills | Status: DC

## 2018-07-10 MED ORDER — CLOPIDOGREL BISULFATE 75 MG PO TABS: 75.0000 mg | ORAL_TABLET | Freq: Every day | ORAL | 3 refills | Status: DC

## 2018-07-10 NOTE — Progress Notes (Signed)
Subjective:    Patient ID: Marvin Frost, male    DOB: 1946-02-25, 73 y.o.   MRN: [id number]  HPI Pt here for follow up and management of chronic medical problems which includes diabetes and hyperlipidemia. He is taking medication regularly.  The patient has recently had a heart attack and had one stent placed.  He saw the cardiologist yesterday.  He has had lab work done and we will go over that with him during the visit today.  He is requesting a refill on his amoxicillin because of a visit to the dentist.  He did see the eye doctor in November of this past year and he saw Dr. Carolynn Sayers in August of this past year.  His vital signs are stable his weight is 225 with a BMI of 34.  Had a recent heart attack with stent placement.  He is not certain if he missed a dose of the Repatha or not in light of everything that has happened to him with his heart recently.  Today he denies any chest pain pressure tightness or shortness of breath.  He did have shortness of breath with Brilinta but that has been stopped.  He denies any trouble with swallowing but about 10 days ago did have a black tarry bowel movement and started himself back on iron after the black tarry bowel movement.  He has had some occasional loose bowel movements and then formed bowel movements.  He is not had any abdominal pain.  He is passing his water without problems.   Patient Active Problem List   Diagnosis Date Noted  . CAD S/P percutaneous coronary angioplasty 05/29/2018  . Unstable angina ([place]) 05/27/2018  . Dyslipidemia 09/21/2017  . Coronary artery disease involving native coronary artery of native heart without angina pectoris 09/21/2017  . Gastric polyps 09/18/2017  . Diabetic retinopathy ([place]) 08/01/2017  . 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 Primary osteoarthritis of both knees 01/26/2015  . Nonspecific (abnormal) findings on radiological and other examination of gastrointestinal tract 09/27/2010  . NAUSEA AND VOMITING 08/16/2010  .  Type 2 diabetes mellitus not at goal [facility]) 03/18/2009  . HYPERLIPIDEMIA 03/18/2009  . OBESITY 03/18/2009  . Essential hypertension 03/18/2009  . Coronary atherosclerosis 03/18/2009  . [facility] 03/18/2009  . GERD 03/18/2009   Outpatient Encounter Medications as of 07/10/2018  Medication Sig  . aspirin EC 81 MG EC tablet Take 1 tablet (81 mg total) by mouth daily.  Marland Kitchen BENICAR HCT 40-25 MG tablet TAKE 1 TABLET DAILY (Patient taking differently: Take 1 tablet by mouth daily. )  . Blood Glucose Monitoring Suppl (FREESTYLE LITE) DEVI Use to check BS bid. DX. E11.9  . cetirizine (ZYRTEC) 10 MG tablet Take 10 mg by mouth daily as needed for allergies or rhinitis.   Marland Kitchen clopidogrel (PLAVIX) 75 MG tablet Take 1 tablet (75 mg total) by mouth daily.  . diclofenac (VOLTAREN) 75 MG EC tablet Take 1 tablet (75 mg total) by mouth 2 (two) times daily.  . Empagliflozin-metFORMIN HCl (SYNJARDY) 12.10-998 MG TABS Take 1 tablet by mouth 2 (two) times daily.  Marland Kitchen FeFum-FePoly-FA-B Cmp-C-Biot (INTEGRA PLUS) CAPS Take 1 capsule by mouth daily. (Patient taking differently: Take 1 capsule by mouth daily as needed (energy). )  . ferrous sulfate 325 (65 FE) MG tablet Take 1 tablet (325 mg total) by mouth every other day.  Marland Kitchen glucose blood (FREESTYLE LITE) test strip USE TO CHECK BLOOD SUGAR TWICE A DAY AND AS NEEDED  . insulin aspart (NOVOLOG  FLEXPEN) 100 UNIT/ML FlexPen INJECT 16 TO 20 UNITS UNDER THE SKIN TWICE A DAY (Patient taking differently: Inject 10-22 Units into the skin See admin instructions. Inject 22 units subcutaneously before breakfast and after supper, inject 10-15 units at bedtime per sliding scale: CBG <200 10 - 12 units, >200 15 units)  . Insulin Degludec (TRESIBA FLEXTOUCH) 200 UNIT/ML SOPN Inject 60-70 Units into the skin daily. (Patient taking differently: Inject 72 Units into the skin at bedtime. )  . Insulin Pen Needle (NOVOTWIST) 32G X 5 MM MISC USE WITH INSULIN PENS UP TO 5 TIMES DAILY  . Lancets  (FREESTYLE) lancets Check BS BID and PRN. DX.E11.9  . metoprolol tartrate (LOPRESSOR) 50 MG tablet Take 1 tablet (50 mg total) by mouth 2 (two) times daily.  . pantoprazole (PROTONIX) 40 MG tablet TAKE 1 TABLET TWICE A DAY (Patient taking differently: Take 40 mg by mouth 2 (two) times daily after a meal. )  . REPATHA SURECLICK 970 MG/ML SOAJ INJECT 140 MG UNDER THE SKIN EVERY 14 DAYS (Patient taking differently: Inject 140 mg into the skin every 30 (thirty) days. )  . rosuvastatin (CRESTOR) 20 MG tablet Take 1 tablet (20 mg total) by mouth daily. (Patient taking differently: Take 20 mg by mouth every other day. Taking every 3 days)  . amoxicillin (AMOXIL) 500 MG capsule Take 2,000 mg by mouth See admin instructions. Take four capsules (2000 mg) by mouth one hour prior to dental appointments  . nitroGLYCERIN (NITROSTAT) 0.4 MG SL tablet Place 1 tablet (0.4 mg total) under the tongue every 5 (five) minutes as needed for chest pain. (Patient not taking: Reported on 07/10/2018)  . [DISCONTINUED] hydrOXYzine (ATARAX/VISTARIL) 50 MG tablet Take 1 tablet (50 mg total) by mouth 3 (three) times daily as needed.   No facility-administered encounter medications on file as of 07/10/2018.      Review of Systems  Constitutional: Negative.   HENT: Negative.   Eyes: Negative.   Respiratory: Negative.   Cardiovascular: Negative.   Gastrointestinal: Negative.        Dark colored stool  Endocrine: Negative.   Genitourinary: Negative.   Musculoskeletal: Negative.   Skin: Negative.   Allergic/Immunologic: Negative.   Neurological: Negative.   Hematological: Negative.   Psychiatric/Behavioral: Negative.        Objective:   Physical Exam Vitals signs and nursing note reviewed.  Constitutional:      Appearance: Normal appearance. He is well-developed. He is obese. He is not ill-appearing.     Comments: Patient is pleasant and alert and we spent a lot of time reviewing his recent lab work and the  abnormalities that were there and how he was taken his medicines.  HENT:     Head: Normocephalic and atraumatic.     Right Ear: Tympanic membrane, ear canal and external ear normal. There is no impacted cerumen.     Left Ear: Tympanic membrane, ear canal and external ear normal. There is no impacted cerumen.     Nose: Nose normal. No congestion.     Comments: Nasal passages and throat were clear    Mouth/Throat:     Mouth: Mucous membranes are moist.     Pharynx: Oropharynx is clear. No oropharyngeal exudate.  Eyes:     General: No scleral icterus.       Right eye: No discharge.        Left eye: No discharge.     Extraocular Movements: Extraocular movements intact.     Conjunctiva/sclera: Conjunctivae  normal.     Pupils: Pupils are equal, round, and reactive to light.  Neck:     Musculoskeletal: Normal range of motion and neck supple.     Thyroid: No thyromegaly.     Vascular: No carotid bruit.     Trachea: No tracheal deviation.     Comments: No bruits thyromegaly or anterior cervical adenopathy Cardiovascular:     Rate and Rhythm: Normal rate and regular rhythm.     Heart sounds: Normal heart sounds. No murmur. No friction rub. No gallop.      Comments: The heart is regular at 72/min with good pedal pulses and no edema Pulmonary:     Effort: Pulmonary effort is normal.     Breath sounds: Normal breath sounds. No wheezing or rales.     Comments: Chest is clear anteriorly and posteriorly no axillary adenopathy chest wall masses or tenderness.  There is a contusion on the right anterior rib area and patient says he thinks he hit this while working in the yard a couple weeks ago. Chest:     Chest wall: No tenderness.  Abdominal:     General: Bowel sounds are normal.     Palpations: Abdomen is soft. There is no mass.     Tenderness: There is no abdominal tenderness. There is no guarding.     Comments: Obese without masses tenderness organ enlargement bruits or inguinal adenopathy    Musculoskeletal: Normal range of motion.        General: No tenderness.     Right lower leg: No edema.     Left lower leg: No edema.  Lymphadenopathy:     Cervical: No cervical adenopathy.  Skin:    General: Skin is warm and dry.     Coloration: Skin is not pale.     Findings: Bruising present. No rash.  Neurological:     General: No focal deficit present.     Mental Status: He is alert and oriented to person, place, and time. Mental status is at baseline.     Cranial Nerves: No cranial nerve deficit.     Deep Tendon Reflexes: Reflexes are normal and symmetric. Reflexes normal.  Psychiatric:        Mood and Affect: Mood normal.        Behavior: Behavior normal.        Thought Content: Thought content normal.        Judgment: Judgment normal.     BP 119/64 (BP Location: Left Arm)   Pulse 77   Temp (!) 97.4 F (36.3 C) (Oral)   Ht 5\' 8"  (1.727 m)   Wt 225 lb (102.1 kg)   BMI 34.21 kg/m        Assessment & Plan:  1. Low serum vitamin D - continue vitamin D replacement  2. Type 2 diabetes mellitus with other circulatory complications (HCC) -Hemoglobin A1c was 7.1% this time and improved from previous and he will continue with his current diabetic regimen  3. Hyperlipidemia associated with type 2 diabetes mellitus ([place]) -Patient is not certain if he left off some of the Repatha doses or not.  We will recheck a lipid liver panel in 6 weeks because he had such great improvement following the initiation of Repatha but the LDL C is back up and not at goal again.  He will continue with aggressive therapeutic lifestyle changes. - Lipid panel; Future - Hepatic function panel; Future  4. Gastroesophageal reflux disease, esophagitis presence not specified -Patient has  a history of gastric polyps.  He is not complaining with any pain today.  His hemoglobin is still running low.  5. Decreased hemoglobin -Also has had some black tarry bowel movements and this happened before he  started back on his iron replacement in the past 10 days.  We will repeat the CBC in 3 to 4 weeks or let the gastroenterologist do this if he thinks it is necessary. - Ambulatory referral to Gastroenterology  6.  Gastric polyps -Neurology as planned  Meds ordered this encounter  Medications  . amoxicillin (AMOXIL) 500 MG capsule    Sig: Take 4 capsules (2,000 mg total) by mouth See admin instructions. Take four capsules (2000 mg) by mouth one hour prior to dental appointments    Dispense:  4 capsule    Refill:  5   Patient Instructions                       Medicare Annual Wellness Visit  [place] and the medical providers at [facility] strive to bring you the best medical care.  In doing so we not only want to address your current medical conditions and concerns but also to detect new conditions early and prevent illness, disease and health-related problems.    Medicare offers a yearly Wellness Visit which allows our clinical staff to assess your need for preventative services including immunizations, lifestyle education, counseling to decrease risk of preventable diseases and screening for fall risk and other medical concerns.    This visit is provided free of charge (no copay) for all Medicare recipients. The clinical pharmacists at [facility] have begun to conduct these Wellness Visits which will also include a thorough review of all your medications.    As you primary medical provider recommend that you make an appointment for your Annual Wellness Visit if you have not done so already this year.  You may set up this appointment before you leave today or you may call back ([phone]) and schedule an appointment.  Please make sure when you call that you mention that you are scheduling your Annual Wellness Visit with the clinical pharmacist so that the appointment may be made for the proper length of time.     Continue current  medications. Continue good therapeutic lifestyle changes which include good diet and exercise. Fall precautions discussed with patient. If an FOBT was given today- please return it to our front desk. If you are over 46 years old - you may need Prevnar 22 or the adult Pneumonia vaccine.  **Flu shots are available--- please call and schedule a FLU-CLINIC appointment**  After your visit with [place] today you will receive a survey in the mail or online from [employer] regarding your care with [place]. Please take a moment to fill this out. Your feedback is very important to [place] as you can help [place] better understand your patient needs as well as improve your experience and satisfaction. WE CARE ABOUT YOU!!!   We will ask you to go back and see Dr. Carlean Purl again in light of the black tarry bowel movements. Make sure you take the Repatha regularly and we will repeat the lipid liver panel in about 6 weeks after knowing you been using it regularly. Return the FOBT Follow-up with cardiology in 4 months as planned CBC should be repeated in 4 weeks or so.  Arrie Senate MD

## 2018-07-10 NOTE — Patient Instructions (Addendum)
Medicare Annual Wellness Visit  [facility] and the medical providers at [facility] strive to bring you the best medical care.  In doing so we not only want to address your current medical conditions and concerns but also to detect new conditions early and prevent illness, disease and health-related problems.    Medicare offers a yearly Wellness Visit which allows our clinical staff to assess your need for preventative services including immunizations, lifestyle education, counseling to decrease risk of preventable diseases and screening for fall risk and other medical concerns.    This visit is provided free of charge (no copay) for all Medicare recipients. The clinical pharmacists at [facility] have begun to conduct these Wellness Visits which will also include a thorough review of all your medications.    As you primary medical provider recommend that you make an appointment for your Annual Wellness Visit if you have not done so already this year.  You may set up this appointment before you leave today or you may call back ([phone]) and schedule an appointment.  Please make sure when you call that you mention that you are scheduling your Annual Wellness Visit with the clinical pharmacist so that the appointment may be made for the proper length of time.     Continue current medications. Continue good therapeutic lifestyle changes which include good diet and exercise. Fall precautions discussed with patient. If an FOBT was given today- please return it to our front desk. If you are over 69 years old - you may need Prevnar 79 or the adult Pneumonia vaccine.  **Flu shots are available--- please call and schedule a FLU-CLINIC appointment**  After your visit with [place] today you will receive a survey in the mail or online from [employer] regarding your care with [place]. Please take a moment to fill this out. Your feedback is very  important to [place] as you can help [place] better understand your patient needs as well as improve your experience and satisfaction. WE CARE ABOUT YOU!!!   We will ask you to go back and see Dr. Carlean Purl again in light of the black tarry bowel movements. Make sure you take the Repatha regularly and we will repeat the lipid liver panel in about 6 weeks after knowing you been using it regularly. Return the FOBT Follow-up with cardiology in 4 months as planned CBC should be repeated in 4 weeks or so.

## 2018-07-10 NOTE — Addendum Note (Signed)
Addended by: Vennie Homans on: 07/10/2018 04:48 PM   Modules accepted: Orders

## 2018-07-11 ENCOUNTER — Other Ambulatory Visit: Payer: Medicare Other

## 2018-07-11 DIAGNOSIS — Z125 Encounter for screening for malignant neoplasm of prostate: Secondary | ICD-10-CM

## 2018-07-12 LAB — FECAL OCCULT BLOOD, IMMUNOCHEMICAL: Fecal Occult Bld: NEGATIVE

## 2018-07-15 ENCOUNTER — Telehealth (HOSPITAL_COMMUNITY): Payer: Self-pay

## 2018-07-15 NOTE — Telephone Encounter (Signed)
Called pt to see if he would like to proceed with scheduling for CR, he stated not at this time.  ° °Closed referral °

## 2018-07-15 NOTE — Progress Notes (Signed)
His iron deficiency anemia persists had it last year when I did EGD and colonoscopy We will have him come see me He will be due for EGD in about March anyway but is on Plavix so will see him in office

## 2018-07-15 NOTE — Telephone Encounter (Signed)
Pt called back and stated he would like to go forward with scheduling. Patient will come in for orientation on 08/13/2018 @ 815AM and will attend the 245PM exercise class. Went over insurance, patient verbalized understanding.  Mailed homework package.

## 2018-07-17 ENCOUNTER — Ambulatory Visit: Payer: Medicare Other | Admitting: Family Medicine

## 2018-07-19 ENCOUNTER — Telehealth (HOSPITAL_COMMUNITY): Payer: Self-pay

## 2018-07-22 ENCOUNTER — Encounter: Payer: Self-pay | Admitting: *Deleted

## 2018-07-22 ENCOUNTER — Ambulatory Visit (INDEPENDENT_AMBULATORY_CARE_PROVIDER_SITE_OTHER): Payer: Medicare Other | Admitting: *Deleted

## 2018-07-22 VITALS — BP 147/75 | HR 77 | Ht 68.0 in | Wt 226.0 lb

## 2018-07-22 DIAGNOSIS — Z23 Encounter for immunization: Secondary | ICD-10-CM

## 2018-07-22 DIAGNOSIS — Z0000 Encounter for general adult medical examination without abnormal findings: Secondary | ICD-10-CM | POA: Diagnosis not present

## 2018-07-22 MED ORDER — FERROUS SULFATE 325 (65 FE) MG PO TABS: 325.0000 mg | ORAL_TABLET | ORAL | 1 refills | Status: AC

## 2018-07-22 NOTE — Patient Instructions (Signed)
Please work on your goal of increasing your exercise to 1 hour 3 times per week with the cardiac rehab program.  Continue your exercise regimen once you have finished cardiac rehab.   At your convenience, please bring a copy of your Advance Directives (Healthcare Power of Attorney and Living Will) to our office to be filed in your medical record.  You received your first Shingrix vaccine (Shingles).  You will need the 2nd dose after 09/20/2018.  Thank you for coming in for your Annual Wellness Visit today!   Diabetes Mellitus and Nutrition, Adult When you have diabetes (diabetes mellitus), it is very important to have healthy eating habits because your blood sugar (glucose) levels are greatly affected by what you eat and drink. Eating healthy foods in the appropriate amounts, at about the same times every day, can help you:  Control your blood glucose.  Lower your risk of heart disease.  Improve your blood pressure.  Reach or maintain a healthy weight. Every person with diabetes is different, and each person has different needs for a meal plan. Your health care provider may recommend that you work with a diet and nutrition specialist (dietitian) to make a meal plan that is best for you. Your meal plan may vary depending on factors such as:  The calories you need.  The medicines you take.  Your weight.  Your blood glucose, blood pressure, and cholesterol levels.  Your activity level.  Other health conditions you have, such as heart or kidney disease. How do carbohydrates affect me? Carbohydrates, also called carbs, affect your blood glucose level more than any other type of food. Eating carbs naturally raises the amount of glucose in your blood. Carb counting is a method for keeping track of how many carbs you eat. Counting carbs is important to keep your blood glucose at a healthy level, especially if you use insulin or take certain oral diabetes medicines. It is important to know  how many carbs you can safely have in each meal. This is different for every person. Your dietitian can help you calculate how many carbs you should have at each meal and for each snack. Foods that contain carbs include:  Bread, cereal, rice, pasta, and crackers.  Potatoes and corn.  Peas, beans, and lentils.  Milk and yogurt.  Fruit and juice.  Desserts, such as cakes, cookies, ice cream, and candy. How does alcohol affect me? Alcohol can cause a sudden decrease in blood glucose (hypoglycemia), especially if you use insulin or take certain oral diabetes medicines. Hypoglycemia can be a life-threatening condition. Symptoms of hypoglycemia (sleepiness, dizziness, and confusion) are similar to symptoms of having too much alcohol. If your health care provider says that alcohol is safe for you, follow these guidelines:  Limit alcohol intake to no more than 1 drink per day for nonpregnant women and 2 drinks per day for men. One drink equals 12 oz of beer, 5 oz of wine, or 1 oz of hard liquor.  Do not drink on an empty stomach.  Keep yourself hydrated with water, diet soda, or unsweetened iced tea.  Keep in mind that regular soda, juice, and other mixers may contain a lot of sugar and must be counted as carbs. What are tips for following this plan?  Reading food labels  Start by checking the serving size on the "Nutrition Facts" label of packaged foods and drinks. The amount of calories, carbs, fats, and other nutrients listed on the label is based on one serving  of the item. Many items contain more than one serving per package.  Check the total grams (g) of carbs in one serving. You can calculate the number of servings of carbs in one serving by dividing the total carbs by 15. For example, if a food has 30 g of total carbs, it would be equal to 2 servings of carbs.  Check the number of grams (g) of saturated and trans fats in one serving. Choose foods that have low or no amount of these  fats.  Check the number of milligrams (mg) of salt (sodium) in one serving. Most people should limit total sodium intake to less than 2,300 mg per day.  Always check the nutrition information of foods labeled as "low-fat" or "nonfat". These foods may be higher in added sugar or refined carbs and should be avoided.  Talk to your dietitian to identify your daily goals for nutrients listed on the label. Shopping  Avoid buying canned, premade, or processed foods. These foods tend to be high in fat, sodium, and added sugar.  Shop around the outside edge of the grocery store. This includes fresh fruits and vegetables, bulk grains, fresh meats, and fresh dairy. Cooking  Use low-heat cooking methods, such as baking, instead of high-heat cooking methods like deep frying.  Cook using healthy oils, such as olive, canola, or sunflower oil.  Avoid cooking with butter, cream, or high-fat meats. Meal planning  Eat meals and snacks regularly, preferably at the same times every day. Avoid going long periods of time without eating.  Eat foods high in fiber, such as fresh fruits, vegetables, beans, and whole grains. Talk to your dietitian about how many servings of carbs you can eat at each meal.  Eat 4-6 ounces (oz) of lean protein each day, such as lean meat, chicken, fish, eggs, or tofu. One oz of lean protein is equal to: ? 1 oz of meat, chicken, or fish. ? 1 egg. ?  cup of tofu.  Eat some foods each day that contain healthy fats, such as avocado, nuts, seeds, and fish. Lifestyle  Check your blood glucose regularly.  Exercise regularly as told by your health care provider. This may include: ? 150 minutes of moderate-intensity or vigorous-intensity exercise each week. This could be brisk walking, biking, or water aerobics. ? Stretching and doing strength exercises, such as yoga or weightlifting, at least 2 times a week.  Take medicines as told by your health care provider.  Do not use any  products that contain nicotine or tobacco, such as cigarettes and e-cigarettes. If you need help quitting, ask your health care provider.  Work with a Social worker or diabetes educator to identify strategies to manage stress and any emotional and social challenges. Questions to ask a health care provider  Do I need to meet with a diabetes educator?  Do I need to meet with a dietitian?  What number can I call if I have questions?  When are the best times to check my blood glucose? Where to find more information:  American Diabetes Association: diabetes.org  Academy of Nutrition and Dietetics: www.eatright.[employer] of Diabetes and Digestive and Kidney Diseases (NIH): [employer] Summary  A healthy meal plan will help you control your blood glucose and maintain a healthy lifestyle.  Working with a diet and nutrition specialist (dietitian) can help you make a meal plan that is best for you.  Keep in mind that carbohydrates (carbs) and alcohol have immediate effects on your blood glucose  levels. It is important to count carbs and to use alcohol carefully. This information is not intended to replace advice given to you by your health care provider. Make sure you discuss any questions you have with your health care provider. Document Released: 03/09/2005 Document Revised: 01/10/2017 Document Reviewed: 07/17/2016 Elsevier Interactive Patient Education  2019 [employer] 65 Years and Older, Male Preventive care refers to lifestyle choices and visits with your health care provider that can promote health and wellness. What does preventive care include?   A yearly physical exam. This is also called an annual well check.  Dental exams once or twice a year.  Routine eye exams. Ask your health care provider how often you should have your eyes checked.  Personal lifestyle choices, including: ? Daily care of your teeth and gums. ? Regular physical  activity. ? Eating a healthy diet. ? Avoiding tobacco and drug use. ? Limiting alcohol use. ? Practicing safe sex. ? Taking low doses of aspirin every day. ? Taking vitamin and mineral supplements as recommended by your health care provider. What happens during an annual well check? The services and screenings done by your health care provider during your annual well check will depend on your age, overall health, lifestyle risk factors, and family history of disease. Counseling Your health care provider may ask you questions about your:  Alcohol use.  Tobacco use.  Drug use.  Emotional well-being.  Home and relationship well-being.  Sexual activity.  Eating habits.  History of falls.  Memory and ability to understand (cognition).  Work and work Statistician. Screening You may have the following tests or measurements:  Height, weight, and BMI.  Blood pressure.  Lipid and cholesterol levels. These may be checked every 5 years, or more frequently if you are over 61 years old.  Skin check.  Lung cancer screening. You may have this screening every year starting at age 62 if you have a 30-pack-year history of smoking and currently smoke or have quit within the past 15 years.  Colorectal cancer screening. All adults should have this screening starting at age 18 and continuing until age 38. You will have tests every 1-10 years, depending on your results and the type of screening test. People at increased risk should start screening at an earlier age. Screening tests may include: ? Guaiac-based fecal occult blood testing. ? Fecal immunochemical test (FIT). ? Stool DNA test. ? Virtual colonoscopy. ? Sigmoidoscopy. During this test, a flexible tube with a tiny camera (sigmoidoscope) is used to examine your rectum and lower colon. The sigmoidoscope is inserted through your anus into your rectum and lower colon. ? Colonoscopy. During this test, a long, thin, flexible tube with a  tiny camera (colonoscope) is used to examine your entire colon and rectum.  Prostate cancer screening. Recommendations will vary depending on your family history and other risks.  Hepatitis C blood test.  Hepatitis B blood test.  Sexually transmitted disease (STD) testing.  Diabetes screening. This is done by checking your blood sugar (glucose) after you have not eaten for a while (fasting). You may have this done every 1-3 years.  Abdominal aortic aneurysm (AAA) screening. You may need this if you are a current or former smoker.  Osteoporosis. You may be screened starting at age 48 if you are at high risk. Talk with your health care provider about your test results, treatment options, and if necessary, the need for more tests. Vaccines Your health care provider may recommend certain  vaccines, such as:  Influenza vaccine. This is recommended every year.  Tetanus, diphtheria, and acellular pertussis (Tdap, Td) vaccine. You may need a Td booster every 10 years.  Varicella vaccine. You may need this if you have not been vaccinated.  Zoster vaccine. You may need this after age 27.  Measles, mumps, and rubella (MMR) vaccine. You may need at least one dose of MMR if you were born in 1957 or later. You may also need a second dose.  Pneumococcal 13-valent conjugate (PCV13) vaccine. One dose is recommended after age 2.  Pneumococcal polysaccharide (PPSV23) vaccine. One dose is recommended after age 47.  Meningococcal vaccine. You may need this if you have certain conditions.  Hepatitis A vaccine. You may need this if you have certain conditions or if you travel or work in places where you may be exposed to hepatitis A.  Hepatitis B vaccine. You may need this if you have certain conditions or if you travel or work in places where you may be exposed to hepatitis B.  Haemophilus influenzae type b (Hib) vaccine. You may need this if you have certain risk factors. Talk to your health care  provider about which screenings and vaccines you need and how often you need them. This information is not intended to replace advice given to you by your health care provider. Make sure you discuss any questions you have with your health care provider. Document Released: 07/09/2015 Document Revised: 08/02/2017 Document Reviewed: 04/13/2015 Elsevier Interactive Patient Education  2019 [employer].

## 2018-07-23 ENCOUNTER — Encounter: Payer: Self-pay | Admitting: *Deleted

## 2018-07-23 NOTE — Progress Notes (Addendum)
Subjective:   Marvin Frost is a 73 y.o. male who presents for Medicare Annual/Subsequent preventive examination.  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  He enjoys being active in his church, blacksmithing, and working on and looking at [employer] cars.  He lives at home with his wife.  Marvin Frost has 1 daughter, 1 step daughter, and 2 grand children.  He feels his health has improved from the previous year because he had a stent placed in December 2019, and is feeling much better with increased energy since then.  His only only ER visit, hospital admission and surgical procedure over the past year was related to CAD with stent placement in December 2019.  He plans to start cardiac rehab classes in February.    Review of Systems:   All systems negative today  Cardiac Risk Factors include: advanced age (>34men, >68 women);diabetes mellitus;dyslipidemia;family history of premature cardiovascular disease;hypertension;male gender;obesity (BMI >30kg/m2)     Objective:    Vitals: BP (!) 147/75   Pulse 77   Ht 5\' 8"  (1.727 m)   Wt 226 lb (102.5 kg)   BMI 34.36 kg/m   Body mass index is 34.36 kg/m.  Advanced Directives 07/22/2018 05/27/2018 07/26/2017 07/10/2017 11/12/2015 06/22/2015 06/22/2015  Does Patient Have a Medical Advance Directive? Yes No Yes Yes Yes Yes Yes  Type of Paramedic of [place];Living will - - - [place];Living will [place];Living will [place];Living will  Does patient want to make changes to medical advance directive? No - Patient declined - - - - No - Patient declined No - Patient declined  Copy of [place] in Chart? No - copy requested - - - Yes Yes Yes  Would patient like information on creating a medical advance directive? - No - Patient declined - - - - -  Pre-existing out of facility DNR order (yellow form or pink MOST  form) - - - - - - -    Tobacco Social History   Tobacco Use  Smoking Status Former Smoker  . Packs/day: 1.00  . Years: 10.00  . Pack years: 10.00  . Types: Cigarettes  . Last attempt to quit: 04/06/1984  . Years since quitting: 34.3  Smokeless Tobacco Former Systems developer  . Types: Chew  . Quit date: 06/26/1978     Counseling given: No   Clinical Intake:     Pain Score: 0-No pain                 Past Medical History:  Diagnosis Date  . Allergy   . Arthritis   . CAD (coronary artery disease)   . 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 Cataract   . Diabetes mellitus   . Dysrhythmia    history of heart palpitations   . Gastric polyps 09/18/2017  . GERD (gastroesophageal reflux disease)   . History of frequent urinary tract infections   . History of hiatal hernia   . Hyperlipemia   . Hypertension   . Obesity   . Pancreatitis    Past Surgical History:  Procedure Laterality Date  . CARDIAC CATHETERIZATION     2004 and 1999- done by Dr Percival Spanish   . COLONOSCOPY  11/2003   diverticulosis, no polyps  . CORONARY STENT INTERVENTION N/A 05/28/2018   Procedure: CORONARY STENT INTERVENTION;  Surgeon: Burt Knack,  Legrand Como, MD;  Location: [facility];  Service: Cardiovascular;  Laterality: N/A;  . EYE SURGERY Bilateral    cateracts - dr Katy Fitch  . HYDROCELE EXCISION / REPAIR    . INTRAVASCULAR PRESSURE WIRE/FFR STUDY N/A 05/28/2018   Procedure: INTRAVASCULAR PRESSURE WIRE/FFR STUDY;  Surgeon: Sherren Mocha, MD;  Location: [facility];  Service: Cardiovascular;  Laterality: N/A;  . left calf surgery      related to trauma of being torn   . LEFT HEART CATH AND CORONARY ANGIOGRAPHY N/A 05/28/2018   Procedure: LEFT HEART CATH AND CORONARY ANGIOGRAPHY;  Surgeon: Sherren Mocha, MD;  Location: [facility];  Service: Cardiovascular;  Laterality: N/A;  . left index finger arthrioscopic surgery     . lymph node removed from right neck       . right shoulder surgery    . TOTAL HIP ARTHROPLASTY Right 03/16/2015   Procedure: RIGHT TOTAL HIP ARTHROPLASTY ANTERIOR APPROACH;  Surgeon: Paralee Cancel, MD;  Location: [facility];  Service: Orthopedics;  Laterality: Right;  . TOTAL HIP ARTHROPLASTY Left 06/22/2015   Procedure: LEFT TOTAL HIP ARTHROPLASTY ANTERIOR APPROACH;  Surgeon: Paralee Cancel, MD;  Location: [facility];  Service: Orthopedics;  Laterality: Left;  . UPPER GASTROINTESTINAL ENDOSCOPY  08/19/2010   hiatal hernia 2cm, mild gastritis   Family History  Problem Relation Age of Onset  . Heart disease Mother   . Heart attack Mother   . Diabetes Brother   . Heart disease Brother   . Heart attack Brother 54  . Heart attack Father 48  . GI problems Sister        torn esophagus during endoscopy  . Healthy Daughter   . Colon polyps Neg Hx   . Esophageal cancer Neg Hx   . Rectal cancer Neg Hx   . Stomach cancer Neg Hx    Social History   Socioeconomic History  . Marital status: Married    Spouse name: Not on file  . Number of children: 1  . Years of education: Not on file  . Highest education level: Associate degree: occupational, Hotel manager, or vocational program  Occupational History  . Occupation: Best boy: [employer]    Comment: retired  Scientific laboratory technician  . Financial resource strain: Not hard at all  . Food insecurity:    Worry: Never true    Inability: Never true  . Transportation needs:    Medical: No    Non-medical: No  Tobacco Use  . Smoking status: Former Smoker    Packs/day: 1.00    Years: 10.00    Pack years: 10.00    Types: Cigarettes    Last attempt to quit: 04/06/1984    Years since quitting: 34.3  . Smokeless tobacco: Former Systems developer    Types: [place] date: 06/26/1978  Substance and Sexual Activity  . Alcohol use: No  . Drug use: No  . Sexual activity: Yes  Lifestyle  . Physical activity:    Days per week: 5 days    Minutes per session: 20 min  . Stress: Not at all  Relationships   . Social connections:    Talks on phone: More than three times a week    Gets together: More than three times a week    Attends religious service: More than 4 times per year    Active member of club or organization: Yes    Attends meetings of clubs or organizations: More than 4  times per year    Relationship status: Married  Other Topics Concern  . Not on file  Social History Narrative   Married   Retired from [employer]   1 child   Former but no current tobacco   No EtOH/drugs    Outpatient Encounter Medications as of 07/22/2018  Medication Sig  . amoxicillin (AMOXIL) 500 MG capsule Take 4 capsules (2,000 mg total) by mouth See admin instructions. Take four capsules (2000 mg) by mouth one hour prior to dental appointments  . aspirin EC 81 MG EC tablet Take 1 tablet (81 mg total) by mouth daily.  Marland Kitchen BENICAR HCT 40-25 MG tablet TAKE 1 TABLET DAILY (Patient taking differently: Take 1 tablet by mouth daily. )  . Blood Glucose Monitoring Suppl (FREESTYLE LITE) DEVI Use to check BS bid. DX. E11.9  . cetirizine (ZYRTEC) 10 MG tablet Take 10 mg by mouth daily as needed for allergies or rhinitis.   Marland Kitchen clopidogrel (PLAVIX) 75 MG tablet Take 1 tablet (75 mg total) by mouth daily.  . diclofenac (VOLTAREN) 75 MG EC tablet Take 1 tablet (75 mg total) by mouth 2 (two) times daily.  . Empagliflozin-metFORMIN HCl (SYNJARDY) 12.10-998 MG TABS Take 1 tablet by mouth 2 (two) times daily.  Marland Kitchen FeFum-FePoly-FA-B Cmp-C-Biot (INTEGRA PLUS) CAPS Take 1 capsule by mouth daily. (Patient taking differently: Take 1 capsule by mouth daily as needed (energy). )  . ferrous sulfate 325 (65 FE) MG tablet Take 1 tablet (325 mg total) by mouth every other day.  Marland Kitchen glucose blood (FREESTYLE LITE) test strip USE TO CHECK BLOOD SUGAR TWICE A DAY AND AS NEEDED  . insulin aspart (NOVOLOG FLEXPEN) 100 UNIT/ML FlexPen INJECT 16 TO 20 UNITS UNDER THE SKIN TWICE A DAY (Patient taking differently: Inject 10-22 Units into the skin  See admin instructions. Inject 22 units subcutaneously before breakfast and after supper, inject 10-15 units at bedtime per sliding scale: CBG <200 10 - 12 units, >200 15 units)  . Insulin Degludec (TRESIBA FLEXTOUCH) 200 UNIT/ML SOPN Inject 60-70 Units into the skin daily. (Patient taking differently: Inject 72 Units into the skin at bedtime. )  . Insulin Pen Needle (NOVOTWIST) 32G X 5 MM MISC USE WITH INSULIN PENS UP TO 5 TIMES DAILY  . Lancets (FREESTYLE) lancets Check BS BID and PRN. DX.E11.9  . metoprolol tartrate (LOPRESSOR) 50 MG tablet Take 1 tablet (50 mg total) by mouth 2 (two) times daily.  . pantoprazole (PROTONIX) 40 MG tablet TAKE 1 TABLET TWICE A DAY (Patient taking differently: Take 40 mg by mouth 2 (two) times daily after a meal. )  . REPATHA SURECLICK 209 MG/ML SOAJ INJECT 140 MG UNDER THE SKIN EVERY 14 DAYS (Patient taking differently: Inject 140 mg into the skin every 30 (thirty) days. )  . rosuvastatin (CRESTOR) 20 MG tablet Take 1 tablet (20 mg total) by mouth daily. (Patient taking differently: Take 20 mg by mouth every other day. Taking every 3 days)  . [DISCONTINUED] ferrous sulfate 325 (65 FE) MG tablet Take 1 tablet (325 mg total) by mouth every other day.  . nitroGLYCERIN (NITROSTAT) 0.4 MG SL tablet Place 1 tablet (0.4 mg total) under the tongue every 5 (five) minutes as needed for chest pain. (Patient not taking: Reported on 07/22/2018)   No facility-administered encounter medications on file as of 07/22/2018.     Activities of Daily Living In your present state of health, do you have any difficulty performing the following activities: 07/22/2018 05/27/2018  Hearing?  N N  Vision? N N  Difficulty concentrating or making decisions? N N  Walking or climbing stairs? Y N  Comment Due to left knee arthritis  -  Dressing or bathing? N N  Doing errands, shopping? N N  Preparing Food and eating ? N -  Using the Toilet? N -  In the past six months, have you accidently leaked  urine? N -  Do you have problems with loss of bowel control? N -  Managing your Medications? N -  Managing your Finances? N -  Housekeeping or managing your Housekeeping? Y -  Comment Gets fatigued, able to complete chores -  Some recent data might be hidden    Patient Care Team: Chipper Herb, MD as PCP - General (Family Medicine) Minus Breeding, MD as PCP - Cardiology (Cardiology) Hayden Pedro, MD as Consulting Physician (Ophthalmology) Paralee Cancel, MD as Consulting Physician (Orthopedic Surgery) Minus Breeding, MD as Consulting Physician (Cardiology)   Assessment:   This is a routine wellness examination for Marvin Frost.  Exercise Activities and Dietary recommendations  Mr. Check states he usually eats 2 meals per day and snacks as needed.  He has been trying to eat a more heart healthy diet since have a stent placed in 05/2018.  He usually has eggs, grits, and apples for breakfast, he states he has bacon twice per week, for his 2nd meal of the day he has chicken and vegetables.  Recommended decreasing intake of processed meats such as bacon.  Also recommended diet of mostly non-starchy vegetables, and lean proteins, and fruits and whole grains in moderation.  Patient states he has access to all of the food he needs.   Current Exercise Habits: Home exercise routine, Type of exercise: walking, Time (Minutes): 15, Frequency (Times/Week): 5, Weekly Exercise (Minutes/Week): 75, Intensity: Mild, Exercise limited by: cardiac condition(s);orthopedic condition(s)  Goals    . Exercise 3x per week (60 min per time)     Exercise with cardiac rehab and continue once program is finished.        Fall Risk Fall Risk  07/01/2018 03/27/2018 02/20/2018 10/15/2017 08/31/2017  Falls in the past year? 0 No No No No  No falls in the past year reported 07/22/2018.    Is the patient's home free of loose throw rugs in walkways, pet beds, electrical cords, etc?   yes      Grab bars in the bathroom? no       Handrails on the stairs?   yes      Adequate lighting?   yes    Depression Screen PHQ 2/9 Scores 07/01/2018 03/27/2018 02/20/2018 10/15/2017  PHQ - 2 Score 0 0 0 0  PHQ- 9 Score - - - -  PHQ 9 Score - 0 07/22/2018    Cognitive Function MMSE - Mini Mental State Exam 07/23/2018 05/08/2017 11/12/2015 11/05/2014  Orientation to time 5 5 5 5   Orientation to Place 5 5 5 5   Registration 3 3 3 3   Attention/ Calculation 5 5 5 5   Recall 3 3 3 3   Language- name 2 objects 2 2 2 2   Language- repeat 1 1 1 1   Language- follow 3 step command 3 3 3 3   Language- read & follow direction 1 1 1 1   Write a sentence 1 1 1 1   Copy design 1 1 1 1   Total score 30 30 30 30         Immunization History  Administered Date(s) Administered  . Influenza  Whole 02/24/2010  . Influenza, High Dose Seasonal PF 04/15/2015, 04/12/2016, 04/05/2017, 04/16/2018  . Influenza,inj,Quad PF,6+ Mos 03/26/2013, 03/30/2014  . Pneumococcal Conjugate-13 06/17/2013  . Pneumococcal Polysaccharide-23 02/24/2009  . Td 02/24/2006  . Tdap 04/27/2011  . Zoster 07/27/2006  . Zoster Recombinat (Shingrix) 07/22/2018    Qualifies for Shingles Vaccine?  Yes, 1st dose given today  Screening Tests Health Maintenance  Topic Date Due  . HEMOGLOBIN A1C  01/07/2019  . OPHTHALMOLOGY EXAM  02/15/2019  . FOOT EXAM  07/11/2019  . TETANUS/TDAP  04/26/2021  . INFLUENZA VACCINE  Completed  . Hepatitis C Screening  Completed  . PNA vac Low Risk Adult  Completed   Cancer Screenings: Lung: Low Dose CT Chest recommended if Age 66-80 years, 30 pack-year currently smoking OR have quit w/in 15years. Patient does not qualify. Colorectal: up to date  Additional Screenings:  Hepatitis C Screening:  Completed 11/12/15      Plan:     Work on your goal of increasing your exercise to 1 hour 3 times per week with the cardiac rehab program.  Continue your exercise regimen once you have finished cardiac rehab. Bring a copy of your Advance Directives  (Healthcare Power of Attorney and Living Will) to our office to be filed in your medical record. Follow up with Dr. Laurance Flatten as scheduled.    I have personally reviewed and noted the following in the patient's chart:   . Medical and social history . Use of alcohol, tobacco or illicit drugs  . Current medications and supplements . Functional ability and status . Nutritional status . Physical activity . Advanced directives . List of other physicians . Hospitalizations, surgeries, and ER visits in previous 12 months . Vitals . Screenings to include cognitive, depression, and falls . Referrals and appointments  In addition, I have reviewed and discussed with patient certain preventive protocols, quality metrics, and best practice recommendations. A written personalized care plan for preventive services as well as general preventive health recommendations were provided to patient.     Shoua Ressler M, RN  07/23/2018  I have reviewed and agree with the above AWV documentation.   Mary-Margaret Hassell Done, FNP

## 2018-07-25 ENCOUNTER — Other Ambulatory Visit: Payer: Self-pay | Admitting: Family Medicine

## 2018-07-26 ENCOUNTER — Other Ambulatory Visit: Payer: Self-pay | Admitting: Family Medicine

## 2018-08-07 ENCOUNTER — Telehealth (HOSPITAL_COMMUNITY): Payer: Self-pay

## 2018-08-08 ENCOUNTER — Telehealth: Payer: Self-pay | Admitting: Cardiology

## 2018-08-08 MED ORDER — CARVEDILOL 3.125 MG PO TABS: 3.1250 mg | ORAL_TABLET | Freq: Two times a day (BID) | ORAL | 6 refills | Status: DC

## 2018-08-08 NOTE — Telephone Encounter (Signed)
Spoke with pt, New script sent to the pharmacy  

## 2018-08-08 NOTE — Telephone Encounter (Signed)
Pt c/o medication issue:  1. Name of Medication: metoprolol tartrate (LOPRESSOR) 50 MG tablet  2. How are you currently taking this medication (dosage and times per day)? Take 1 tablet (50 mg total) by mouth 2 (two) times daily  3. Are you having a reaction (difficulty breathing--STAT)? Itching and a little bit of a rash.   4. What is your medication issue? Itching and a little bit of a rash.

## 2018-08-08 NOTE — Telephone Encounter (Signed)
Cardiac Rehab Medication Review by a Pharmacist  Does the patient  feel that his/her medications are working for him/her?  yes  Has the patient been experiencing any side effects to the medications prescribed?  Yes - itching rash. States he tried BB in past and had a rash and thinks this new BB prescribed is causing similar SE. He has called MD and notified of SE.   Does the patient measure his/her own blood pressure or blood glucose at home?  yes - states near goal. States HbA1c 7.1 in 05/2018 - but not in Epic. Highest SBP noted at home is upper 140s and lowest noted is 80s. Reports average BP to be around 135/75  Does the patient have any problems obtaining medications due to transportation or finances?   no  Understanding of regimen: excellent Understanding of indications: good Potential of compliance: excellent    Pharmacist comments: Pt states he is compliant with medications.   Juanell Fairly, PharmD PGY1 Pharmacy Resident Phone [phone] 08/08/2018 3:02 PM

## 2018-08-08 NOTE — Telephone Encounter (Signed)
Have patient switch to carvedilol 12.5 mg bid.  His chart does indicate previous allergy to metoprolol succinate.

## 2018-08-09 NOTE — Progress Notes (Signed)
Marvin Frost 73 y.o. male DOB [birth year] MRN [id number]       Nutrition Screen Note  No diagnosis found. Past Medical History:  Diagnosis Date  . Allergy   . Arthritis   . CAD (coronary artery disease)   . 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 Cataract   . Diabetes mellitus   . Dysrhythmia    history of heart palpitations   . Gastric polyps 09/18/2017  . GERD (gastroesophageal reflux disease)   . History of frequent urinary tract infections   . History of hiatal hernia   . Hyperlipemia   . Hypertension   . Obesity   . Pancreatitis    Meds reviewed.    Current Outpatient Medications (Endocrine & Metabolic):  Marland Kitchen  Empagliflozin-metFORMIN HCl (SYNJARDY) 12.10-998 MG TABS, Take 1 tablet by mouth 2 (two) times daily. .  insulin aspart (NOVOLOG FLEXPEN) 100 UNIT/ML FlexPen, INJECT 16 TO 20 UNITS UNDER THE SKIN TWICE A DAY (Patient taking differently: Inject 10-22 Units into the skin See admin instructions. Inject 22 units subcutaneously before breakfast and after supper, inject 10-15 units at bedtime per sliding scale: CBG <200 10 - 12 units, >200 15 units) .  Insulin Degludec (TRESIBA FLEXTOUCH) 200 UNIT/ML SOPN, Inject 60-70 Units into the skin daily. (Patient taking differently: Inject 72 Units into the skin at bedtime. )  Current Outpatient Medications (Cardiovascular):  Marland Kitchen  BENICAR HCT 40-25 MG tablet, TAKE 1 TABLET DAILY (Patient taking differently: Take 1 tablet by mouth daily. ) .  carvedilol (COREG) 3.125 MG tablet, Take 1 tablet (3.125 mg total) by mouth 2 (two) times daily. .  nitroGLYCERIN (NITROSTAT) 0.4 MG SL tablet, Place 1 tablet (0.4 mg total) under the tongue every 5 (five) minutes as needed for chest pain. Marland Kitchen  REPATHA SURECLICK 474 MG/ML SOAJ, INJECT 140 MG UNDER THE SKIN EVERY 14 DAYS (Patient taking differently: Inject 140 mg into the skin every 30 (thirty) days. ) .  rosuvastatin (CRESTOR) 20 MG tablet, Take 1 tablet (20 mg total) by mouth  daily. (Patient taking differently: Take 20 mg by mouth every other day. Taking every 3 days)  Current Outpatient Medications (Respiratory):  .  cetirizine (ZYRTEC) 10 MG tablet, Take 10 mg by mouth daily as needed for allergies or rhinitis.   Current Outpatient Medications (Analgesics):  .  aspirin EC 81 MG EC tablet, Take 1 tablet (81 mg total) by mouth daily. .  diclofenac (VOLTAREN) 75 MG EC tablet, Take 1 tablet (75 mg total) by mouth 2 (two) times daily.  Current Outpatient Medications (Hematological):  .  clopidogrel (PLAVIX) 75 MG tablet, Take 1 tablet (75 mg total) by mouth daily. Marland Kitchen  FeFum-FePoly-FA-B Cmp-C-Biot (INTEGRA PLUS) CAPS, TAKE 1 CAPSULE DAILY (Patient not taking: Reported on 08/08/2018) .  ferrous sulfate 325 (65 FE) MG tablet, Take 1 tablet (325 mg total) by mouth every other day.  Current Outpatient Medications (Other):  .  amoxicillin (AMOXIL) 500 MG capsule, Take 4 capsules (2,000 mg total) by mouth See admin instructions. Take four capsules (2000 mg) by mouth one hour prior to dental appointments (Patient not taking: Reported on 08/08/2018) .  Blood Glucose Monitoring Suppl (FREESTYLE LITE) DEVI, Use to check BS bid. DX. E11.9 .  glucose blood (FREESTYLE LITE) test strip, USE TO CHECK BLOOD SUGAR TWICE A DAY AND AS NEEDED .  Insulin Pen Needle (NOVOTWIST) 32G X 5 MM MISC, USE WITH INSULIN PENS UP TO 5 TIMES  DAILY .  Lancets (FREESTYLE) lancets, Check BS BID and PRN. DX.E11.9 .  pantoprazole (PROTONIX) 40 MG tablet, Take 1 tablet (40 mg total) by mouth 2 (two) times daily after a meal.   HT: Ht Readings from Last 1 Encounters:  07/22/18 5\' 8"  (1.727 m)    WT: Wt Readings from Last 5 Encounters:  07/22/18 226 lb (102.5 kg)  07/10/18 225 lb (102.1 kg)  07/09/18 225 lb (102.1 kg)  07/01/18 226 lb (102.5 kg)  06/10/18 223 lb 6.4 oz (101.3 kg)     BMI = 34.22  07/10/18   Current tobacco use? No       Labs:  Lipid Panel     Component Value Date/Time   CHOL  184 07/09/2018 1114   CHOL 157 12/31/2012 0857   TRIG 237 (H) 07/09/2018 1114   TRIG 271 (H) 02/17/2016 0824   TRIG 184 (H) 12/31/2012 0857   HDL 39 (L) 07/09/2018 1114   HDL 44 02/17/2016 0824   HDL 45 12/31/2012 0857   CHOLHDL 4.7 07/09/2018 1114   LDLCALC 98 07/09/2018 1114   LDLCALC 52 01/19/2014 1007   LDLCALC 75 12/31/2012 0857   LDLDIRECT 39 11/02/2016 0806    Lab Results  Component Value Date   HGBA1C 7.1 (H) 07/09/2018   CBG (last 3)  No results for input(s): GLUCAP in the last 72 hours.  Nutrition Diagnosis ? Food-and nutrition-related knowledge deficit related to lack of exposure to information as related to diagnosis of: ? CVD ? Type 2 Diabetes ? Obese  I = 30-34.9 related to excessive energy intake as evidenced by a BMI = 34.22  07/10/18   Nutrition Goal(s):  ? To be determined  Plan:  Pt to attend nutrition classes ? Nutrition I ? Nutrition II ? Portion Distortion  ? Diabetes Blitz ? Diabetes Q & A Will provide client-centered nutrition education as part of interdisciplinary care.   Monitor and evaluate progress toward nutrition goal with team.  Laurina Bustle, MS, RD, LDN 08/09/2018 1:40 PM

## 2018-08-10 ENCOUNTER — Other Ambulatory Visit: Payer: Self-pay | Admitting: Family Medicine

## 2018-08-10 DIAGNOSIS — E1169 Type 2 diabetes mellitus with other specified complication: Secondary | ICD-10-CM

## 2018-08-10 DIAGNOSIS — E782 Mixed hyperlipidemia: Principal | ICD-10-CM

## 2018-08-10 DIAGNOSIS — Z8679 Personal history of other diseases of the circulatory system: Secondary | ICD-10-CM

## 2018-08-13 ENCOUNTER — Encounter (HOSPITAL_COMMUNITY)
Admission: RE | Admit: 2018-08-13 | Discharge: 2018-08-13 | Disposition: A | Discharge status: Home / Self Care | Payer: Medicare Other | Source: Ambulatory Visit | Attending: Cardiology | Admitting: Cardiology

## 2018-08-13 ENCOUNTER — Encounter (HOSPITAL_COMMUNITY): Payer: Self-pay

## 2018-08-13 VITALS — BP 132/72 | HR 78 | Ht 67.5 in | Wt 230.6 lb

## 2018-08-13 DIAGNOSIS — Z955 Presence of coronary angioplasty implant and graft: Secondary | ICD-10-CM | POA: Diagnosis not present

## 2018-08-13 NOTE — Progress Notes (Signed)
Marvin Frost 73 y.o. male DOB: Jun 28, 1945 MRN: [id number]      Nutrition Note  1. 05/28/18 DES LAD    Past Medical History:  Diagnosis Date  . Allergy   . Arthritis   . CAD (coronary artery disease)   . 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 Cataract   . Diabetes mellitus   . Dysrhythmia    history of heart palpitations   . Gastric polyps 09/18/2017  . GERD (gastroesophageal reflux disease)   . History of frequent urinary tract infections   . History of hiatal hernia   . Hyperlipemia   . Hypertension   . Obesity   . Pancreatitis    Meds reviewed.   Current Outpatient Medications (Endocrine & Metabolic):  Marland Kitchen  Empagliflozin-metFORMIN HCl (SYNJARDY) 12.10-998 MG TABS, Take 1 tablet by mouth 2 (two) times daily. .  insulin aspart (NOVOLOG FLEXPEN) 100 UNIT/ML FlexPen, INJECT 16 TO 20 UNITS UNDER THE SKIN TWICE A DAY (Patient taking differently: Inject 10-22 Units into the skin See admin instructions. Inject 22 units subcutaneously before breakfast and after supper, inject 10-15 units at bedtime per sliding scale: CBG <200 10 - 12 units, >200 15 units) .  Insulin Degludec (TRESIBA FLEXTOUCH) 200 UNIT/ML SOPN, Inject 60-70 Units into the skin daily. (Patient taking differently: Inject 72 Units into the skin at bedtime. )  Current Outpatient Medications (Cardiovascular):  Marland Kitchen  BENICAR HCT 40-25 MG tablet, TAKE 1 TABLET DAILY (Patient taking differently: Take 1 tablet by mouth daily. ) .  carvedilol (COREG) 3.125 MG tablet, Take 1 tablet (3.125 mg total) by mouth 2 (two) times daily. .  nitroGLYCERIN (NITROSTAT) 0.4 MG SL tablet, Place 1 tablet (0.4 mg total) under the tongue every 5 (five) minutes as needed for chest pain. Marland Kitchen  REPATHA SURECLICK 756 MG/ML SOAJ, INJECT 140 MG UNDER THE SKIN EVERY 14 DAYS .  rosuvastatin (CRESTOR) 20 MG tablet, Take 1 tablet (20 mg total) by mouth daily. (Patient taking differently: Take 20 mg by mouth every other day. Taking every 3  days)  Current Outpatient Medications (Respiratory):  .  cetirizine (ZYRTEC) 10 MG tablet, Take 10 mg by mouth daily as needed for allergies or rhinitis.   Current Outpatient Medications (Analgesics):  .  aspirin EC 81 MG EC tablet, Take 1 tablet (81 mg total) by mouth daily. .  diclofenac (VOLTAREN) 75 MG EC tablet, Take 1 tablet (75 mg total) by mouth 2 (two) times daily.  Current Outpatient Medications (Hematological):  .  clopidogrel (PLAVIX) 75 MG tablet, Take 1 tablet (75 mg total) by mouth daily. Marland Kitchen  FeFum-FePoly-FA-B Cmp-C-Biot (INTEGRA PLUS) CAPS, TAKE 1 CAPSULE DAILY (Patient not taking: Reported on 08/08/2018) .  ferrous sulfate 325 (65 FE) MG tablet, Take 1 tablet (325 mg total) by mouth every other day.  Current Outpatient Medications (Other):  .  amoxicillin (AMOXIL) 500 MG capsule, Take 4 capsules (2,000 mg total) by mouth See admin instructions. Take four capsules (2000 mg) by mouth one hour prior to dental appointments (Patient not taking: Reported on 08/08/2018) .  Blood Glucose Monitoring Suppl (FREESTYLE LITE) DEVI, Use to check BS bid. DX. E11.9 .  glucose blood (FREESTYLE LITE) test strip, USE TO CHECK BLOOD SUGAR TWICE A DAY AND AS NEEDED .  Insulin Pen Needle (NOVOTWIST) 32G X 5 MM MISC, USE WITH INSULIN PENS UP TO 5 TIMES DAILY .  Lancets (FREESTYLE) lancets, Check BS BID and PRN. DX.E11.9 .  pantoprazole (PROTONIX) 40 MG tablet, Take 1 tablet (40 mg total) by mouth 2 (two) times daily after a meal.   HT: Ht Readings from Last 1 Encounters:  08/13/18 5' 7.5" (1.715 m)    WT: Wt Readings from Last 5 Encounters:  08/13/18 230 lb 9.6 oz (104.6 kg)  07/22/18 226 lb (102.5 kg)  07/10/18 225 lb (102.1 kg)  07/09/18 225 lb (102.1 kg)  07/01/18 226 lb (102.5 kg)     Body mass index is 35.58 kg/m.   Current tobacco use? No  Labs:  Lipid Panel     Component Value Date/Time   CHOL 184 07/09/2018 1114   CHOL 157 12/31/2012 0857   TRIG 237 (H) 07/09/2018 1114    TRIG 271 (H) 02/17/2016 0824   TRIG 184 (H) 12/31/2012 0857   HDL 39 (L) 07/09/2018 1114   HDL 44 02/17/2016 0824   HDL 45 12/31/2012 0857   CHOLHDL 4.7 07/09/2018 1114   LDLCALC 98 07/09/2018 1114   LDLCALC 52 01/19/2014 1007   LDLCALC 75 12/31/2012 0857   LDLDIRECT 39 11/02/2016 0806    Lab Results  Component Value Date   HGBA1C 7.1 (H) 07/09/2018   CBG (last 3)  No results for input(s): GLUCAP in the last 72 hours.  Nutrition Note Spoke with pt. Nutrition plan and goals reviewed with pt. Pt is following Step 2 of the Therapeutic Lifestyle Changes diet. Pt wants to lose wt. Pt has not been trying to lose wt. Heart healthy diabetic weight loss tips reviewed (label reading, how to build a healthy plate, portion sizes, eating frequently across the day, carb counting). Pt last A1C was elevated. Discussed the differences between complex and refined carbs, recommended pt replace refined carbs with complex. Reviewed the benefits swapping in complex carbs and moderating portion sizes can have on managing blood glucose with patient. Pt has Type 2 Diabetes. Last A1c indicates blood glucose well-controlled. This Probation officer went over Diabetes Education test results. Pt checks CBG's 2 times a day. Fasting CBG's reportedly 90-120 mg/dL. Per discussion, pt does not use canned/convenience foods often. Pt does not add salt to food. Pt does not eat out frequently. Pt expressed understanding of the information reviewed. Pt aware of nutrition education classes offered and would like to attend nutrition classes.  Nutrition Diagnosis ? Food-and nutrition-related knowledge deficit related to lack of exposure to information as related to diagnosis of: ? CVD ? Type 2 Diabetes ? Obese  II = 35-39.9 related to excessive energy intake as evidenced by a Body mass index is 35.58 kg/m.  Nutrition Intervention ? Pt's individual nutrition plan and goals reviewed with pt. ? Pt given handouts for: ? Nutrition I class ?  Nutrition II class  ? Diabetes Blitz Class   Nutrition Goal(s):  ? Pt to identify and limit food sources of saturated fat, trans fat, refined carbohydrates and sodium ? Pt to identify food quantities necessary to achieve weight loss of 6-24 lb at graduation from cardiac rehab.  ? Pt to build a healthy plate including vegetables, fruits, whole grains, and low-fat dairy products in a heart healthy meal plan. ? Pt to weigh and measure serving sizes  Plan:  ? Pt to attend nutrition classes:  ? Nutrition I ? Nutrition II ? Portion Distortion  ? Diabetes Blitz ? Diabetes Q & Ae determined ? Will provide client-centered nutrition education as part of interdisciplinary care ? Monitor and evaluate progress toward nutrition goal with team.   Laurina Bustle, MS, RD,  LDN 08/13/2018 3:36 PM

## 2018-08-13 NOTE — Progress Notes (Signed)
Cardiac Individual Treatment Plan  Patient Details  Name: Marvin Frost MRN: [id number] Date of Birth: 1945/12/05 Referring Provider:     [facility] from 08/13/2018 in [place]  Referring Provider  Minus Breeding, MD      Initial Encounter Date:    CARDIAC REHAB PHASE II ORIENTATION from 08/13/2018 in [facility]  Date  08/13/18      Visit Diagnosis: 05/28/18 DES LAD  Patient's Home Medications on Admission:  Current Outpatient Medications:  .  amoxicillin (AMOXIL) 500 MG capsule, Take 4 capsules (2,000 mg total) by mouth See admin instructions. Take four capsules (2000 mg) by mouth one hour prior to dental appointments (Patient not taking: Reported on 08/08/2018), Disp: 4 capsule, Rfl: 5 .  aspirin EC 81 MG EC tablet, Take 1 tablet (81 mg total) by mouth daily., Disp: , Rfl:  .  BENICAR HCT 40-25 MG tablet, TAKE 1 TABLET DAILY (Patient taking differently: Take 1 tablet by mouth daily. ), Disp: 90 tablet, Rfl: 1 .  Blood Glucose Monitoring Suppl (FREESTYLE LITE) DEVI, Use to check BS bid. DX. E11.9, Disp: 1 each, Rfl: 0 .  carvedilol (COREG) 3.125 MG tablet, Take 1 tablet (3.125 mg total) by mouth 2 (two) times daily., Disp: 60 tablet, Rfl: 6 .  cetirizine (ZYRTEC) 10 MG tablet, Take 10 mg by mouth daily as needed for allergies or rhinitis. , Disp: , Rfl:  .  clopidogrel (PLAVIX) 75 MG tablet, Take 1 tablet (75 mg total) by mouth daily., Disp: 90 tablet, Rfl: 3 .  diclofenac (VOLTAREN) 75 MG EC tablet, Take 1 tablet (75 mg total) by mouth 2 (two) times daily., Disp: 30 tablet, Rfl: 0 .  Empagliflozin-metFORMIN HCl (SYNJARDY) 12.10-998 MG TABS, Take 1 tablet by mouth 2 (two) times daily., Disp: 180 tablet, Rfl: 3 .  FeFum-FePoly-FA-B Cmp-C-Biot (INTEGRA PLUS) CAPS, TAKE 1 CAPSULE DAILY (Patient not taking: Reported on 08/08/2018), Disp: 90 capsule, Rfl: 4 .  ferrous sulfate 325 (65 FE) MG tablet, Take 1  tablet (325 mg total) by mouth every other day., Disp: 90 tablet, Rfl: 1 .  glucose blood (FREESTYLE LITE) test strip, USE TO CHECK BLOOD SUGAR TWICE A DAY AND AS NEEDED, Disp: 300 each, Rfl: 3 .  insulin aspart (NOVOLOG FLEXPEN) 100 UNIT/ML FlexPen, INJECT 16 TO 20 UNITS UNDER THE SKIN TWICE A DAY (Patient taking differently: Inject 10-22 Units into the skin See admin instructions. Inject 22 units subcutaneously before breakfast and after supper, inject 10-15 units at bedtime per sliding scale: CBG <200 10 - 12 units, >200 15 units), Disp: 45 mL, Rfl: 3 .  Insulin Degludec (TRESIBA FLEXTOUCH) 200 UNIT/ML SOPN, Inject 60-70 Units into the skin daily. (Patient taking differently: Inject 72 Units into the skin at bedtime. ), Disp: 60 mL, Rfl: 3 .  Insulin Pen Needle (NOVOTWIST) 32G X 5 MM MISC, USE WITH INSULIN PENS UP TO 5 TIMES DAILY, Disp: 300 each, Rfl: 9 .  Lancets (FREESTYLE) lancets, Check BS BID and PRN. DX.E11.9, Disp: 300 each, Rfl: 3 .  nitroGLYCERIN (NITROSTAT) 0.4 MG SL tablet, Place 1 tablet (0.4 mg total) under the tongue every 5 (five) minutes as needed for chest pain., Disp: 25 tablet, Rfl: 1 .  pantoprazole (PROTONIX) 40 MG tablet, Take 1 tablet (40 mg total) by mouth 2 (two) times daily after a meal., Disp: 180 tablet, Rfl: 2 .  REPATHA SURECLICK 725 MG/ML SOAJ, INJECT 140 MG UNDER THE SKIN  EVERY 14 DAYS, Disp: 6 mL, Rfl: 4 .  rosuvastatin (CRESTOR) 20 MG tablet, Take 1 tablet (20 mg total) by mouth daily. (Patient taking differently: Take 20 mg by mouth every other day. Taking every 3 days), Disp: 90 tablet, Rfl: 4  Past Medical History: Past Medical History:  Diagnosis Date  . Allergy   . Arthritis   . CAD (coronary artery disease)   . 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 Cataract   . Diabetes mellitus   . Dysrhythmia    history of heart palpitations   . Gastric polyps 09/18/2017  . GERD (gastroesophageal reflux disease)   . History of frequent  urinary tract infections   . History of hiatal hernia   . Hyperlipemia   . Hypertension   . Obesity   . Pancreatitis     Tobacco Use: Social History   Tobacco Use  Smoking Status Former Smoker  . Packs/day: 1.00  . Years: 10.00  . Pack years: 10.00  . Types: Cigarettes  . Last attempt to quit: 04/06/1984  . Years since quitting: 34.3  Smokeless Tobacco Former Systems developer  . Types: Chew  . Quit date: 06/26/1978    Labs: Recent Review Flowsheet Data    Labs for ITP Cardiac and Pulmonary Rehab Latest Ref Rng & Units 06/12/2017 10/15/2017 02/19/2018 05/27/2018 07/09/2018   Cholestrol 100 - 199 mg/dL 155 206(H) 78(L) - 184   LDLCALC 0 - 99 mg/dL 66 131(H) 9 - 98   LDLDIRECT 0 - 99 mg/dL - - - - -   HDL >39 mg/dL 46 52 38(L) - 39(L)   Trlycerides 0 - 149 mg/dL 215(H) 116 155(H) - 237(H)   Hemoglobin A1c <7.0 % 7.5(H) 8.1(H) 7.8(H) - 7.1(H)   TCO2 22 - 32 mmol/L - - - 25 -      Capillary Blood Glucose: Lab Results  Component Value Date   GLUCAP 175 (H) 05/29/2018   GLUCAP 166 (H) 05/29/2018   GLUCAP 184 (H) 05/28/2018   GLUCAP 109 (H) 05/28/2018   GLUCAP 139 (H) 05/28/2018     Exercise Target Goals: Exercise Program Goal: Individual exercise prescription set using results from initial 6 min walk test and THRR while considering  patient's activity barriers and safety.   Exercise Prescription Goal: Initial exercise prescription builds to 30-45 minutes a day of aerobic activity, 2-3 days per week.  Home exercise guidelines will be given to patient during program as part of exercise prescription that the participant will acknowledge.  Activity Barriers & Risk Stratification: Activity Barriers & Cardiac Risk Stratification - 08/13/18 1022      Activity Barriers & Cardiac Risk Stratification   Activity Barriers  Left Hip Replacement;Right Hip Replacement;Arthritis;Other (comment)    Comments  Right shoulder repair, left knee arthritis.    Cardiac Risk Stratification  Moderate        6 Minute Walk: 6 Minute Walk    Row Name 08/13/18 0827         6 Minute Walk   Phase  Initial     Distance  1252 feet     Walk Time  6 minutes     # of Rest Breaks  0     MPH  2.37     METS  2.47     RPE  11     Perceived Dyspnea   1     VO2 Peak  8.63     Symptoms  Yes (comment)     Comments  Patient c/o mild dyspnea.     Resting HR  78 bpm     Resting BP  132/72     Resting Oxygen Saturation   97 %     Exercise Oxygen Saturation  during 6 min walk  97 %     Max Ex. HR  110 bpm     Max Ex. BP  160/70     2 Minute Post BP  122/72        Oxygen Initial Assessment:   Oxygen Re-Evaluation:   Oxygen Discharge (Final Oxygen Re-Evaluation):   Initial Exercise Prescription: Initial Exercise Prescription - 08/13/18 1000      Date of Initial Exercise RX and Referring Provider   Date  08/13/18    Referring Provider  Minus Breeding, MD    Expected Discharge Date  11/15/18      Recumbant Bike   Level  2    Watts  15    Minutes  10    METs  2.45      NuStep   Level  2    SPM  85    Minutes  10    METs  2.4      Track   Laps  9    Minutes  10    METs  2.57      Prescription Details   Frequency (times per week)  3    Duration  Progress to 30 minutes of continuous aerobic without signs/symptoms of physical distress      Intensity   THRR 40-80% of Max Heartrate  58-117    Ratings of Perceived Exertion  11-13    Perceived Dyspnea  0-4      Progression   Progression  Continue to progress workloads to maintain intensity without signs/symptoms of physical distress.      Resistance Training   Training Prescription  Yes    Weight  3lbs    Reps  10-15       Perform Capillary Blood Glucose checks as needed.  Exercise Prescription Changes:   Exercise Comments:   Exercise Goals and Review: Exercise Goals    Row Name 08/13/18 1023             Exercise Goals   Increase Physical Activity  Yes       Intervention  Provide advice, education,  support and counseling about physical activity/exercise needs.;Develop an individualized exercise prescription for aerobic and resistive training based on initial evaluation findings, risk stratification, comorbidities and participant's personal goals.       Expected Outcomes  Short Term: Attend rehab on a regular basis to increase amount of physical activity.;Long Term: Add in home exercise to make exercise part of routine and to increase amount of physical activity.;Long Term: Exercising regularly at least 3-5 days a week.       Increase Strength and Stamina  Yes       Intervention  Provide advice, education, support and counseling about physical activity/exercise needs.;Develop an individualized exercise prescription for aerobic and resistive training based on initial evaluation findings, risk stratification, comorbidities and participant's personal goals.       Expected Outcomes  Short Term: Increase workloads from initial exercise prescription for resistance, speed, and METs.;Short Term: Perform resistance training exercises routinely during rehab and add in resistance training at home;Long Term: Improve cardiorespiratory fitness, muscular endurance and strength as measured by increased METs and functional capacity (6MWT)  Able to understand and use rate of perceived exertion (RPE) scale  Yes       Intervention  Provide education and explanation on how to use RPE scale       Expected Outcomes  Short Term: Able to use RPE daily in rehab to express subjective intensity level;Long Term:  Able to use RPE to guide intensity level when exercising independently       Knowledge and understanding of Target Heart Rate Range (THRR)  Yes       Intervention  Provide education and explanation of THRR including how the numbers were predicted and where they are located for reference       Expected Outcomes  Short Term: Able to state/look up THRR;Long Term: Able to use THRR to govern intensity when exercising  independently;Short Term: Able to use daily as guideline for intensity in rehab       Able to check pulse independently  Yes       Intervention  Provide education and demonstration on how to check pulse in carotid and radial arteries.;Review the importance of being able to check your own pulse for safety during independent exercise       Expected Outcomes  Short Term: Able to explain why pulse checking is important during independent exercise;Long Term: Able to check pulse independently and accurately       Understanding of Exercise Prescription  Yes       Intervention  Provide education, explanation, and written materials on patient's individual exercise prescription       Expected Outcomes  Short Term: Able to explain program exercise prescription;Long Term: Able to explain home exercise prescription to exercise independently          Exercise Goals Re-Evaluation :   Discharge Exercise Prescription (Final Exercise Prescription Changes):   Nutrition:  Target Goals: Understanding of nutrition guidelines, daily intake of sodium 1500mg , cholesterol 200mg , calories 30% from fat and 7% or less from saturated fats, daily to have 5 or more servings of fruits and vegetables.  Biometrics: Pre Biometrics - 08/13/18 0803      Pre Biometrics   Height  5' 7.5" (1.715 m)    Weight  104.6 kg    Waist Circumference  48 inches    Hip Circumference  45.5 inches    Waist to Hip Ratio  1.05 %    BMI (Calculated)  35.56    Triceps Skinfold  30 mm    % Body Fat  37.5 %    Grip Strength  26 kg    Flexibility  0 in    Single Leg Stand  13.06 seconds        Nutrition Therapy Plan and Nutrition Goals:   Nutrition Assessments:   Nutrition Goals Re-Evaluation:   Nutrition Goals Re-Evaluation:   Nutrition Goals Discharge (Final Nutrition Goals Re-Evaluation):   Psychosocial: Target Goals: Acknowledge presence or absence of significant depression and/or stress, maximize coping skills,  provide positive support system. Participant is able to verbalize types and ability to use techniques and skills needed for reducing stress and depression.  Initial Review & Psychosocial Screening: Initial Psych Review & Screening - 08/13/18 1114      Initial Review   Current issues with  None Identified      Family Dynamics   Good Support System?  Yes   Marvin Frost has his wife, pastor and friends for support     Barriers   Psychosocial barriers to participate in program  There are no  identifiable barriers or psychosocial needs.      Screening Interventions   Interventions  Encouraged to exercise       Quality of Life Scores: Quality of Life - 08/13/18 1028      Quality of Life   Select  Quality of Life      Quality of Life Scores   Health/Function Pre  25.53 %    Socioeconomic Pre  27.5 %    Psych/Spiritual Pre  29.14 %    Family Pre  30 %    GLOBAL Pre  27.34 %      Scores of 19 and below usually indicate a poorer quality of life in these areas.  A difference of  2-3 points is a clinically meaningful difference.  A difference of 2-3 points in the total score of the Quality of Life Index has been associated with significant improvement in overall quality of life, self-image, physical symptoms, and general health in studies assessing change in quality of life.  PHQ-9: Recent Review Flowsheet Data    Depression screen [facility] 2/9 07/23/2018 07/01/2018 03/27/2018 02/20/2018 10/15/2017   Decreased Interest 0 0 0 0 0   Down, Depressed, Hopeless 0 0 0 0 0   PHQ - 2 Score 0 0 0 0 0     Interpretation of Total Score  Total Score Depression Severity:  1-4 = Minimal depression, 5-9 = Mild depression, 10-14 = Moderate depression, 15-19 = Moderately severe depression, 20-27 = Severe depression   Psychosocial Evaluation and Intervention:   Psychosocial Re-Evaluation:   Psychosocial Discharge (Final Psychosocial Re-Evaluation):   Vocational Rehabilitation: Provide vocational rehab  assistance to qualifying candidates.   Vocational Rehab Evaluation & Intervention: Vocational Rehab - 08/13/18 1115      Initial Vocational Rehab Evaluation & Intervention   Assessment shows need for Vocational Rehabilitation  No   Edon is retired and does not need vocational rehab at this time      Education: Education Goals: Education classes will be provided on a weekly basis, covering required topics. Participant will state understanding/return demonstration of topics presented.  Learning Barriers/Preferences: Learning Barriers/Preferences - 08/13/18 1047      Learning Barriers/Preferences   Learning Barriers  None    Learning Preferences  Written Material;Video;Skilled Demonstration;Pictoral       Education Topics: Count Your Pulse:  -Group instruction provided by verbal instruction, demonstration, patient participation and written materials to support subject.  Instructors address importance of being able to find your pulse and how to count your pulse when at home without a heart monitor.  Patients get hands on experience counting their pulse with staff help and individually.   Heart Attack, Angina, and Risk Factor Modification:  -Group instruction provided by verbal instruction, video, and written materials to support subject.  Instructors address signs and symptoms of angina and heart attacks.    Also discuss risk factors for heart disease and how to make changes to improve heart health risk factors.   Functional Fitness:  -Group instruction provided by verbal instruction, demonstration, patient participation, and written materials to support subject.  Instructors address safety measures for doing things around the house.  Discuss how to get up and down off the floor, how to pick things up properly, how to safely get out of a chair without assistance, and balance training.   Meditation and Mindfulness:  -Group instruction provided by verbal instruction, patient  participation, and written materials to support subject.  Instructor addresses importance of mindfulness and meditation practice to  help reduce stress and improve awareness.  Instructor also leads participants through a meditation exercise.    Stretching for Flexibility and Mobility:  -Group instruction provided by verbal instruction, patient participation, and written materials to support subject.  Instructors lead participants through series of stretches that are designed to increase flexibility thus improving mobility.  These stretches are additional exercise for major muscle groups that are typically performed during regular warm up and cool down.   Hands Only CPR:  -Group verbal, video, and participation provides a basic overview of AHA guidelines for community CPR. Role-play of emergencies allow participants the opportunity to practice calling for help and chest compression technique with discussion of AED use.   Hypertension: -Group verbal and written instruction that provides a basic overview of hypertension including the most recent diagnostic guidelines, risk factor reduction with self-care instructions and medication management.    Nutrition I class: Heart Healthy Eating:  -Group instruction provided by PowerPoint slides, verbal discussion, and written materials to support subject matter. The instructor gives an explanation and review of the Therapeutic Lifestyle Changes diet recommendations, which includes a discussion on lipid goals, dietary fat, sodium, fiber, plant stanol/sterol esters, sugar, and the components of a well-balanced, healthy diet.   Nutrition II class: Lifestyle Skills:  -Group instruction provided by PowerPoint slides, verbal discussion, and written materials to support subject matter. The instructor gives an explanation and review of label reading, grocery shopping for heart health, heart healthy recipe modifications, and ways to make healthier choices when eating  out.   Diabetes Question & Answer:  -Group instruction provided by PowerPoint slides, verbal discussion, and written materials to support subject matter. The instructor gives an explanation and review of diabetes co-morbidities, pre- and post-prandial blood glucose goals, pre-exercise blood glucose goals, signs, symptoms, and treatment of hypoglycemia and hyperglycemia, and foot care basics.   Diabetes Blitz:  -Group instruction provided by PowerPoint slides, verbal discussion, and written materials to support subject matter. The instructor gives an explanation and review of the physiology behind type 1 and type 2 diabetes, diabetes medications and rational behind using different medications, pre- and post-prandial blood glucose recommendations and Hemoglobin A1c goals, diabetes diet, and exercise including blood glucose guidelines for exercising safely.    Portion Distortion:  -Group instruction provided by PowerPoint slides, verbal discussion, written materials, and food models to support subject matter. The instructor gives an explanation of serving size versus portion size, changes in portions sizes over the last 20 years, and what consists of a serving from each food group.   Stress Management:  -Group instruction provided by verbal instruction, video, and written materials to support subject matter.  Instructors review role of stress in heart disease and how to cope with stress positively.     Exercising on Your Own:  -Group instruction provided by verbal instruction, power point, and written materials to support subject.  Instructors discuss benefits of exercise, components of exercise, frequency and intensity of exercise, and end points for exercise.  Also discuss use of nitroglycerin and activating EMS.  Review options of places to exercise outside of rehab.  Review guidelines for sex with heart disease.   Cardiac Drugs I:  -Group instruction provided by verbal instruction and  written materials to support subject.  Instructor reviews cardiac drug classes: antiplatelets, anticoagulants, beta blockers, and statins.  Instructor discusses reasons, side effects, and lifestyle considerations for each drug class.   Cardiac Drugs II:  -Group instruction provided by verbal instruction and written materials to support  subject.  Instructor reviews cardiac drug classes: angiotensin converting enzyme inhibitors (ACE-I), angiotensin II receptor blockers (ARBs), nitrates, and calcium channel blockers.  Instructor discusses reasons, side effects, and lifestyle considerations for each drug class.   Anatomy and Physiology of the Circulatory System:  Group verbal and written instruction and models provide basic cardiac anatomy and physiology, with the coronary electrical and arterial systems. Review of: AMI, Angina, Valve disease, Heart Failure, Peripheral Artery Disease, Cardiac Arrhythmia, Pacemakers, and the ICD.   Other Education:  -Group or individual verbal, written, or video instructions that support the educational goals of the cardiac rehab program.   Holiday Eating Survival Tips:  -Group instruction provided by PowerPoint slides, verbal discussion, and written materials to support subject matter. The instructor gives patients tips, tricks, and techniques to help them not only survive but enjoy the holidays despite the onslaught of food that accompanies the holidays.   Knowledge Questionnaire Score: Knowledge Questionnaire Score - 08/13/18 1026      Knowledge Questionnaire Score   Pre Score  20/24       Core Components/Risk Factors/Patient Goals at Admission: Personal Goals and Risk Factors at Admission - 08/13/18 1035      Core Components/Risk Factors/Patient Goals on Admission    Weight Management  Yes;Obesity    Intervention  Weight Management: Develop a combined nutrition and exercise program designed to reach desired caloric intake, while maintaining appropriate  intake of nutrient and fiber, sodium and fats, and appropriate energy expenditure required for the weight goal.;Weight Management: Provide education and appropriate resources to help participant work on and attain dietary goals.;Weight Management/Obesity: Establish reasonable short term and long term weight goals.;Obesity: Provide education and appropriate resources to help participant work on and attain dietary goals.    Admit Weight  230 lb 9.6 oz (104.6 kg)    Expected Outcomes  Weight Loss: Understanding of general recommendations for a balanced deficit meal plan, which promotes 1-2 lb weight loss per week and includes a negative energy balance of [phone] kcal/d;Short Term: Continue to assess and modify interventions until short term weight is achieved;Long Term: Adherence to nutrition and physical activity/exercise program aimed toward attainment of established weight goal;Understanding recommendations for meals to include 15-35% energy as protein, 25-35% energy from fat, 35-60% energy from carbohydrates, less than 200mg  of dietary cholesterol, 20-35 gm of total fiber daily;Understanding of distribution of calorie intake throughout the day with the consumption of 4-5 meals/snacks    Diabetes  Yes    Intervention  Provide education about signs/symptoms and action to take for hypo/hyperglycemia.;Provide education about proper nutrition, including hydration, and aerobic/resistive exercise prescription along with prescribed medications to achieve blood glucose in normal ranges: Fasting glucose 65-99 mg/dL    Expected Outcomes  Short Term: Participant verbalizes understanding of the signs/symptoms and immediate care of hyper/hypoglycemia, proper foot care and importance of medication, aerobic/resistive exercise and nutrition plan for blood glucose control.;Long Term: Attainment of HbA1C < 7%.    Hypertension  Yes    Intervention  Provide education on lifestyle modifcations including regular physical  activity/exercise, weight management, moderate sodium restriction and increased consumption of fresh fruit, vegetables, and low fat dairy, alcohol moderation, and smoking cessation.;Monitor prescription use compliance.    Expected Outcomes  Short Term: Continued assessment and intervention until BP is < 140/3mm HG in hypertensive participants. < 130/54mm HG in hypertensive participants with diabetes, heart failure or chronic kidney disease.;Long Term: Maintenance of blood pressure at goal levels.    Lipids  Yes  Intervention  Provide education and support for participant on nutrition & aerobic/resistive exercise along with prescribed medications to achieve LDL 70mg , HDL >40mg .    Expected Outcomes  Short Term: Participant states understanding of desired cholesterol values and is compliant with medications prescribed. Participant is following exercise prescription and nutrition guidelines.;Long Term: Cholesterol controlled with medications as prescribed, with individualized exercise RX and with personalized nutrition plan. Value goals: LDL < 70mg , HDL > 40 mg.       Core Components/Risk Factors/Patient Goals Review:    Core Components/Risk Factors/Patient Goals at Discharge (Final Review):    ITP Comments: ITP Comments    Row Name 08/13/18 0807           ITP Comments  Medical Director- Dr. Fransico Him, MD          Comments: Marvin Frost attended orientation from [phone] to [phone] to review rules and guidelines for program. Completed 6 minute walk test, Intitial ITP, and exercise prescription.  VSS. Telemetry-Sinus Rhythm. Marvin Frost did experience mild dyspnea during his walk test this resolved with rest.Marvin Roehrig Venetia Maxon, RN,BSN 08/13/2018 11:29 AM

## 2018-08-19 ENCOUNTER — Other Ambulatory Visit: Payer: Medicare Other

## 2018-08-19 DIAGNOSIS — E1169 Type 2 diabetes mellitus with other specified complication: Secondary | ICD-10-CM

## 2018-08-19 DIAGNOSIS — E785 Hyperlipidemia, unspecified: Secondary | ICD-10-CM | POA: Diagnosis not present

## 2018-08-20 ENCOUNTER — Ambulatory Visit (INDEPENDENT_AMBULATORY_CARE_PROVIDER_SITE_OTHER): Payer: Medicare Other | Admitting: Internal Medicine

## 2018-08-20 ENCOUNTER — Encounter: Payer: Self-pay | Admitting: Internal Medicine

## 2018-08-20 ENCOUNTER — Other Ambulatory Visit (INDEPENDENT_AMBULATORY_CARE_PROVIDER_SITE_OTHER): Payer: Medicare Other

## 2018-08-20 VITALS — BP 130/66 | HR 84 | Ht 67.0 in | Wt 228.5 lb

## 2018-08-20 DIAGNOSIS — D500 Iron deficiency anemia secondary to blood loss (chronic): Secondary | ICD-10-CM

## 2018-08-20 DIAGNOSIS — K317 Polyp of stomach and duodenum: Secondary | ICD-10-CM

## 2018-08-20 DIAGNOSIS — K219 Gastro-esophageal reflux disease without esophagitis: Secondary | ICD-10-CM | POA: Diagnosis not present

## 2018-08-20 DIAGNOSIS — I2510 Atherosclerotic heart disease of native coronary artery without angina pectoris: Secondary | ICD-10-CM

## 2018-08-20 LAB — LIPID PANEL
CHOLESTEROL TOTAL: 112 mg/dL (ref 100–199)
Chol/HDL Ratio: 2.4 ratio (ref 0.0–5.0)
HDL: 46 mg/dL (ref >39)
LDL Calculated: 26 mg/dL (ref 0–99)
Triglycerides: 201 mg/dL — ABNORMAL HIGH (ref 0–149)
VLDL CHOLESTEROL CAL: 40 mg/dL (ref 5–40)

## 2018-08-20 LAB — CBC WITH DIFFERENTIAL/PLATELET
Basophils Absolute: 0.3 10*3/uL — ABNORMAL HIGH (ref 0.0–0.1)
Basophils Relative: 2.8 % (ref 0.0–3.0)
Eosinophils Absolute: 0.6 10*3/uL (ref 0.0–0.7)
Eosinophils Relative: 5.8 % — ABNORMAL HIGH (ref 0.0–5.0)
HCT: 38 % — ABNORMAL LOW (ref 39.0–52.0)
HEMOGLOBIN: 11.4 g/dL — AB (ref 13.0–17.0)
LYMPHS PCT: 16.5 % (ref 12.0–46.0)
Lymphs Abs: 1.6 10*3/uL (ref 0.7–4.0)
MCHC: 30 g/dL (ref 30.0–36.0)
MCV: 69.5 fl — ABNORMAL LOW (ref 78.0–100.0)
MONOS PCT: 8.4 % (ref 3.0–12.0)
Monocytes Absolute: 0.8 10*3/uL (ref 0.1–1.0)
Neutro Abs: 6.5 10*3/uL (ref 1.4–7.7)
Neutrophils Relative %: 66.5 % (ref 43.0–77.0)
Platelets: 351 10*3/uL (ref 150.0–400.0)
RBC: 5.46 Mil/uL (ref 4.22–5.81)
RDW: 23.5 % — ABNORMAL HIGH (ref 11.5–15.5)
WBC: 9.7 10*3/uL (ref 4.0–10.5)

## 2018-08-20 LAB — HEPATIC FUNCTION PANEL
ALT: 26 IU/L (ref 0–44)
AST: 23 IU/L (ref 0–40)
Albumin: 4.2 g/dL (ref 3.7–4.7)
Alkaline Phosphatase: 56 IU/L (ref 39–117)
Bilirubin Total: 0.3 mg/dL (ref 0.0–1.2)
Bilirubin, Direct: 0.13 mg/dL (ref 0.00–0.40)
Total Protein: 6.7 g/dL (ref 6.0–8.5)

## 2018-08-20 LAB — FERRITIN: Ferritin: 8.4 ng/mL — ABNORMAL LOW (ref 22.0–322.0)

## 2018-08-20 NOTE — Progress Notes (Signed)
See note from Dr. Carlean Purl regarding guaiac test for bleeds above the ileocecal valve and FOBT's for bleeds below the ileocecal valve.  This is new information for me I just want all of you to be aware of this.

## 2018-08-20 NOTE — Patient Instructions (Signed)
  Your provider has requested that you go to the basement level for lab work before leaving today. Press "B" on the elevator. The lab is located at the first door on the left as you exit the elevator.    I appreciate the opportunity to care for you. Carl Gessner, MD, FACG 

## 2018-08-20 NOTE — Progress Notes (Signed)
Marvin Frost 73 y.o. Sep 06, 1945 [id number]  Assessment & Plan:   Encounter Diagnoses  Name Primary?  . Iron deficiency anemia due to chronic blood loss   . Gastric polyps   . Gastroesophageal reflux disease, esophagitis presence not specified Yes    I think he is still anemic because he never completely repleted his iron stores.  He was responding and then stop the iron he never normalized his hemoglobin.  This probably had something to do with his angina as well though he felt better after the stenting.  He needs a surveillance EGD but that needs to wait until he can electively come off his Plavix.  He is covered with a PPI to reduce the risk of ulcers and bleeding in the setting of Plavix and aspirin.  I am going to check a CBC and a ferritin today.  If he is responding to the oral iron we will continue that, consider adjusting the amount and dosage, another option would be parenteral iron supplementation.  I do think that I removed the likely culprits causing the bleeding.  He has been Hemoccult negative with a guaiac based test and an immune fecal occult blood test negative in January.  Note that the guaiac test is more sensitive for total gut bleeding, the I FOBT only looks for human hemoglobin and is specific for the colon and cannot tell us anything else about bleeding above the ileocecal valve.  I appreciate the opportunity to care for this patient. CC: Chipper Herb, MD    Subjective:   Chief Complaint: Persistent iron deficiency anemia  HPI [name] is here with his wife, he was found to have anemia and microcytic indices consistent with persistent iron deficiency anemia.  He was last seen by me in March 2019 at which point I removed several hyperplastic gastric polyps that I thought were the likely source of his iron deficiency anemia, and a negative colonoscopy except for diverticulosis.  He had angina in the cardiologist office in December and was placed in the  hospital.  His hemoglobin was 9.1 then.  He had a cardiac catheterization by Dr. Burt Knack and a 75% mid LAD lesion was stented.  Ejection fraction was normal.  He felt significantly better right away.  Subsequently he saw Dr. Laurance Flatten he had an I FOBT test that was negative, and his hemoglobin was still 9.1 MCV 68 RDW 22.  He has had those numbers for several months, his hemoglobin got the 12 in the middle of last year but he never normalized his hemoglobin or his MCV and he did not continue to take iron supplementation.  He is back on iron supplementation now.  He had a few dark stools question melena back in December he says.  He is now on Plavix and aspirin.  He takes pantoprazole 40 mg twice daily for GERD.  He says he notices his energy level is better since starting the iron.  He also had a negative guaiac card when he was hospitalized. Allergies  Allergen Reactions  . Brilinta [Ticagrelor]     Rash and SOB   . Liraglutide Other (See Comments)    Abdominal pain and enlarged pancreas on CT ? pancreatitis  . Baxinets [Cetylpyridinium-Benzocaine] Other (See Comments)    Unknown reaction  . Celebrex [Celecoxib] Nausea Only  . Toprol Xl [Metoprolol Succinate] Other (See Comments)    Unknown reaction - pt states it did not work  . Triplex Ad Other (See Comments)    Abdominal  pain and enlarged pancreas on CT? pancreatitis  . Welchol [Colesevelam Hcl] Other (See Comments)    constipation  . Norvasc [Amlodipine Besylate] Rash  . Statins Other (See Comments)    muscle and joint pain if taken for long periods of time, takes Crestor every 2-3 days to avoid this reaction   Current Meds  Medication Sig  . aspirin EC 81 MG EC tablet Take 1 tablet (81 mg total) by mouth daily.  Marland Kitchen BENICAR HCT 40-25 MG tablet TAKE 1 TABLET DAILY (Patient taking differently: Take 1 tablet by mouth daily. )  . Blood Glucose Monitoring Suppl (FREESTYLE LITE) DEVI Use to check BS bid. DX. E11.9  . carvedilol (COREG) 3.125 MG  tablet Take 1 tablet (3.125 mg total) by mouth 2 (two) times daily.  . cetirizine (ZYRTEC) 10 MG tablet Take 10 mg by mouth as needed for allergies or rhinitis.   Marland Kitchen clopidogrel (PLAVIX) 75 MG tablet Take 75 mg by mouth daily.  . Empagliflozin-metFORMIN HCl (SYNJARDY) 12.10-998 MG TABS Take 1 tablet by mouth 2 (two) times daily.  . ferrous sulfate 325 (65 FE) MG tablet Take 1 tablet (325 mg total) by mouth every other day.  Marland Kitchen glucose blood (FREESTYLE LITE) test strip USE TO CHECK BLOOD SUGAR TWICE A DAY AND AS NEEDED  . insulin aspart (NOVOLOG FLEXPEN) 100 UNIT/ML FlexPen INJECT 16 TO 20 UNITS UNDER THE SKIN TWICE A DAY (Patient taking differently: Inject 10-22 Units into the skin See admin instructions. Inject 22 units subcutaneously before breakfast and after supper, inject 10-15 units at bedtime per sliding scale: CBG <200 10 - 12 units, >200 15 units)  . Insulin Degludec (TRESIBA FLEXTOUCH) 200 UNIT/ML SOPN Inject 60-70 Units into the skin daily. (Patient taking differently: Inject 72 Units into the skin at bedtime. )  . Insulin Pen Needle (NOVOTWIST) 32G X 5 MM MISC USE WITH INSULIN PENS UP TO 5 TIMES DAILY  . Lancets (FREESTYLE) lancets Check BS BID and PRN. DX.E11.9  . nitroGLYCERIN (NITROSTAT) 0.4 MG SL tablet Place 1 tablet (0.4 mg total) under the tongue every 5 (five) minutes as needed for chest pain.  . pantoprazole (PROTONIX) 40 MG tablet Take 1 tablet (40 mg total) by mouth 2 (two) times daily after a meal.  . REPATHA SURECLICK 595 MG/ML SOAJ INJECT 140 MG UNDER THE SKIN EVERY 14 DAYS  . rosuvastatin (CRESTOR) 20 MG tablet Take 1 tablet (20 mg total) by mouth daily. (Patient taking differently: Take 20 mg by mouth every other day. Taking every 3 days)   Past Medical History:  Diagnosis Date  . Allergy   . Arthritis   . CAD (coronary artery disease)   . 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 Cataract   . Diabetes mellitus   . Dysrhythmia    history of  heart palpitations   . Gastric polyps 09/18/2017  . GERD (gastroesophageal reflux disease)   . Heart attack ([place])   . History of frequent urinary tract infections   . History of hiatal hernia   . Hyperlipemia   . Hypertension   . Obesity   . Pancreatitis    Past Surgical History:  Procedure Laterality Date  . CARDIAC CATHETERIZATION     2004 and 1999- done by Dr Percival Spanish   . COLONOSCOPY  11/2003   diverticulosis, no polyps  . CORONARY STENT INTERVENTION N/A 05/28/2018   Procedure: CORONARY STENT INTERVENTION;  Surgeon: Sherren Mocha, MD;  Location: [facility];  Service: Cardiovascular;  Laterality: N/A;  . EYE SURGERY Bilateral    cateracts - dr Katy Fitch  . HYDROCELE EXCISION / REPAIR    . INTRAVASCULAR PRESSURE WIRE/FFR STUDY N/A 05/28/2018   Procedure: INTRAVASCULAR PRESSURE WIRE/FFR STUDY;  Surgeon: Sherren Mocha, MD;  Location: [facility];  Service: Cardiovascular;  Laterality: N/A;  . left calf surgery      related to trauma of being torn   . LEFT HEART CATH AND CORONARY ANGIOGRAPHY N/A 05/28/2018   Procedure: LEFT HEART CATH AND CORONARY ANGIOGRAPHY;  Surgeon: Sherren Mocha, MD;  Location: [facility];  Service: Cardiovascular;  Laterality: N/A;  . left index finger arthrioscopic surgery     . lymph node removed from right neck     . right shoulder surgery    . TOTAL HIP ARTHROPLASTY Right 03/16/2015   Procedure: RIGHT TOTAL HIP ARTHROPLASTY ANTERIOR APPROACH;  Surgeon: Paralee Cancel, MD;  Location: [facility];  Service: Orthopedics;  Laterality: Right;  . TOTAL HIP ARTHROPLASTY Left 06/22/2015   Procedure: LEFT TOTAL HIP ARTHROPLASTY ANTERIOR APPROACH;  Surgeon: Paralee Cancel, MD;  Location: [facility];  Service: Orthopedics;  Laterality: Left;  . UPPER GASTROINTESTINAL ENDOSCOPY     hiatal hernia 2cm, mild gastritis - 2012, multiple hyperplastic and fundic gland gastric polyps 2019   Social History   Social History Narrative   Married   Retired from [employer]   1 child   Former but no current tobacco   No EtOH/drugs   family history includes Diabetes in his brother; GI problems in his sister; Healthy in his daughter; Heart attack in his mother; Heart attack (age of onset: 34) in his father; Heart attack (age of onset: 46) in his brother; Heart disease in his brother and mother.   Review of Systems See HPI  Objective:   Physical Exam BP 130/66 (BP Location: Left Arm, Patient Position: Sitting, Cuff Size: Normal)   Pulse 84   Ht 5\' 7"  (1.702 m) Comment: height measured without shoes  Wt 228 lb 8 oz (103.6 kg)   BMI 35.79 kg/m  NAD   Hospital and outpatient medical records from December 2019 through January 2020 reviewed.  Labs in the EMR as well for all of 2019 and 2020.

## 2018-08-20 NOTE — Assessment & Plan Note (Signed)
Wait on surveillance due to clopidogrel and DES in 05/2018

## 2018-08-21 ENCOUNTER — Other Ambulatory Visit: Payer: Self-pay | Admitting: Internal Medicine

## 2018-08-21 ENCOUNTER — Encounter (HOSPITAL_COMMUNITY)
Admission: RE | Admit: 2018-08-21 | Discharge: 2018-08-21 | Disposition: A | Discharge status: Home / Self Care | Payer: Medicare Other | Source: Ambulatory Visit | Attending: Cardiology | Admitting: Cardiology

## 2018-08-21 DIAGNOSIS — D500 Iron deficiency anemia secondary to blood loss (chronic): Secondary | ICD-10-CM

## 2018-08-21 DIAGNOSIS — Z955 Presence of coronary angioplasty implant and graft: Secondary | ICD-10-CM

## 2018-08-21 LAB — GLUCOSE, CAPILLARY: GLUCOSE-CAPILLARY: 103 mg/dL — AB (ref 70–99)

## 2018-08-21 NOTE — Progress Notes (Signed)
Marvin Frost 73 y.o. male Nutrition Note Spoke with pt today about carbohydrate counting, and eating a consistent amount of carbohydrates across the day. Reviewed the benefits of carbohydrate counting and eating a consistent amount of carbohydrates across the day. Showed pt how to calculate carbohydrate servings, and distributed handouts for patient to practice. Recommended pt eat 4-5 servings of carbohydrates at meals. Discussed the importance of creating a balanced meal with the addition of protein and non-starchy vegetables. Distributed recipes and snack ideas to patient to try. Additionally discussed with patient that exercise may cause his blood sugar to decrease and that we may need to add in a snack before or after his workout, to manage his blood sugar. Distributed handout of snack ideas that would provide a combination of carbohydrates and protein. Pt verbalized understanding of material discussed today. Distributed RD contact information.     Laurina Bustle, MS, RD, LDN 08/21/2018 3:38 PM

## 2018-08-21 NOTE — Progress Notes (Signed)
Incomplete Session Note  Patient Details  Name: Marvin Frost MRN: [id number] Date of Birth: [birth year] Referring Provider:     [facility] from 08/13/2018 in [place]  Referring Provider  Minus Breeding, MD      [name] did not complete his rehab session.  CBG 103 this afternoon. Taron ate breakfast and had crackers and a drink at 12:30. Luz did not exercise today and was counseled by the dietitian. Jakaleb plan to return to exercise on Friday. Medications reviewed with the patient.Barnet Pall, RN,BSN 08/21/2018 4:24 PM

## 2018-08-21 NOTE — Progress Notes (Signed)
He is responding to oral iron. CBC and ferritin in 3 onths ordred and My Chart message sent

## 2018-08-23 ENCOUNTER — Ambulatory Visit (HOSPITAL_COMMUNITY): Payer: Medicare Other

## 2018-08-23 ENCOUNTER — Encounter (HOSPITAL_COMMUNITY)
Admission: RE | Admit: 2018-08-23 | Discharge: 2018-08-23 | Disposition: A | Discharge status: Home / Self Care | Payer: Medicare Other | Source: Ambulatory Visit | Attending: Cardiology | Admitting: Cardiology

## 2018-08-23 DIAGNOSIS — Z955 Presence of coronary angioplasty implant and graft: Secondary | ICD-10-CM | POA: Diagnosis not present

## 2018-08-23 LAB — GLUCOSE, CAPILLARY
Glucose-Capillary: 168 mg/dL — ABNORMAL HIGH (ref 70–99)
Glucose-Capillary: 165 mg/dL — ABNORMAL HIGH (ref 70–99)

## 2018-08-23 NOTE — Progress Notes (Signed)
Cardiac Individual Treatment Plan  Patient Details  Name: Kashmir Lysaght MRN: [id number] Date of Birth: [birth year] Referring Provider:     [facility] from 08/13/2018 in [place]  Referring Provider  Minus Breeding, MD      Initial Encounter Date:    CARDIAC REHAB PHASE II ORIENTATION from 08/13/2018 in [place]  Date  08/13/18      Visit Diagnosis: 05/28/18 DES LAD  Patient's Home Medications on Admission:  Current Outpatient Medications:  .  amoxicillin (AMOXIL) 500 MG capsule, Take 4 capsules (2,000 mg total) by mouth See admin instructions. Take four capsules (2000 mg) by mouth one hour prior to dental appointments (Patient not taking: Reported on 08/20/2018), Disp: 4 capsule, Rfl: 5 .  aspirin EC 81 MG EC tablet, Take 1 tablet (81 mg total) by mouth daily., Disp: , Rfl:  .  BENICAR HCT 40-25 MG tablet, TAKE 1 TABLET DAILY (Patient taking differently: Take 1 tablet by mouth daily. ), Disp: 90 tablet, Rfl: 1 .  Blood Glucose Monitoring Suppl (FREESTYLE LITE) DEVI, Use to check BS bid. DX. E11.9, Disp: 1 each, Rfl: 0 .  carvedilol (COREG) 3.125 MG tablet, Take 1 tablet (3.125 mg total) by mouth 2 (two) times daily., Disp: 60 tablet, Rfl: 6 .  cetirizine (ZYRTEC) 10 MG tablet, Take 10 mg by mouth as needed for allergies or rhinitis. , Disp: , Rfl:  .  clopidogrel (PLAVIX) 75 MG tablet, Take 75 mg by mouth daily., Disp: , Rfl:  .  Empagliflozin-metFORMIN HCl (SYNJARDY) 12.10-998 MG TABS, Take 1 tablet by mouth 2 (two) times daily., Disp: 180 tablet, Rfl: 3 .  ferrous sulfate 325 (65 FE) MG tablet, Take 1 tablet (325 mg total) by mouth every other day. (Patient taking differently: Take 325 mg by mouth every morning. ), Disp: 90 tablet, Rfl: 1 .  glucose blood (FREESTYLE LITE) test strip, USE TO CHECK BLOOD SUGAR TWICE A DAY AND AS NEEDED, Disp: 300 each, Rfl: 3 .  insulin aspart (NOVOLOG FLEXPEN) 100  UNIT/ML FlexPen, INJECT 16 TO 20 UNITS UNDER THE SKIN TWICE A DAY (Patient taking differently: Inject 10-22 Units into the skin See admin instructions. Inject 22 units subcutaneously before breakfast and after supper, inject 10-15 units at bedtime per sliding scale: CBG <200 10 - 12 units, >200 15 units), Disp: 45 mL, Rfl: 3 .  Insulin Degludec (TRESIBA FLEXTOUCH) 200 UNIT/ML SOPN, Inject 60-70 Units into the skin daily. (Patient taking differently: Inject 72 Units into the skin at bedtime. ), Disp: 60 mL, Rfl: 3 .  Insulin Pen Needle (NOVOTWIST) 32G X 5 MM MISC, USE WITH INSULIN PENS UP TO 5 TIMES DAILY, Disp: 300 each, Rfl: 9 .  Lancets (FREESTYLE) lancets, Check BS BID and PRN. DX.E11.9, Disp: 300 each, Rfl: 3 .  nitroGLYCERIN (NITROSTAT) 0.4 MG SL tablet, Place 1 tablet (0.4 mg total) under the tongue every 5 (five) minutes as needed for chest pain., Disp: 25 tablet, Rfl: 1 .  pantoprazole (PROTONIX) 40 MG tablet, Take 1 tablet (40 mg total) by mouth 2 (two) times daily after a meal., Disp: 180 tablet, Rfl: 2 .  REPATHA SURECLICK 220 MG/ML SOAJ, INJECT 140 MG UNDER THE SKIN EVERY 14 DAYS, Disp: 6 mL, Rfl: 4 .  rosuvastatin (CRESTOR) 20 MG tablet, Take 1 tablet (20 mg total) by mouth daily. (Patient taking differently: Take 20 mg by mouth every other day. Taking every 3 days), Disp: 90  tablet, Rfl: 4  Past Medical History: Past Medical History:  Diagnosis Date  . Allergy   . Arthritis   . CAD (coronary artery disease)   . 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 Cataract   . Diabetes mellitus   . Dysrhythmia    history of heart palpitations   . Gastric polyps 09/18/2017  . GERD (gastroesophageal reflux disease)   . Heart attack ([place])   . History of frequent urinary tract infections   . History of hiatal hernia   . Hyperlipemia   . Hypertension   . Obesity   . Pancreatitis     Tobacco Use: Social History   Tobacco Use  Smoking Status Former Smoker  .  Packs/day: 1.00  . Years: 10.00  . Pack years: 10.00  . Types: Cigarettes  . Last attempt to quit: 04/06/1984  . Years since quitting: 34.4  Smokeless Tobacco Former Systems developer  . Types: Chew  . Quit date: 06/26/1978    Labs: Recent Review Flowsheet Data    Labs for ITP Cardiac and Pulmonary Rehab Latest Ref Rng & Units 10/15/2017 02/19/2018 05/27/2018 07/09/2018 08/19/2018   Cholestrol 100 - 199 mg/dL 206(H) 78(L) - 184 112   LDLCALC 0 - 99 mg/dL 131(H) 9 - 98 26   LDLDIRECT 0 - 99 mg/dL - - - - -   HDL >39 mg/dL 52 38(L) - 39(L) 46   Trlycerides 0 - 149 mg/dL 116 155(H) - 237(H) 201(H)   Hemoglobin A1c <7.0 % 8.1(H) 7.8(H) - 7.1(H) -   TCO2 22 - 32 mmol/L - - 25 - -      Capillary Blood Glucose: Lab Results  Component Value Date   GLUCAP 157 (H) 08/28/2018   GLUCAP 183 (H) 08/26/2018   GLUCAP 165 (H) 08/23/2018   GLUCAP 168 (H) 08/23/2018   GLUCAP 103 (H) 08/21/2018     Exercise Target Goals: Exercise Program Goal: Individual exercise prescription set using results from initial 6 min walk test and THRR while considering  patient's activity barriers and safety.   Exercise Prescription Goal: Initial exercise prescription builds to 30-45 minutes a day of aerobic activity, 2-3 days per week.  Home exercise guidelines will be given to patient during program as part of exercise prescription that the participant will acknowledge.  Activity Barriers & Risk Stratification: Activity Barriers & Cardiac Risk Stratification - 08/13/18 1022      Activity Barriers & Cardiac Risk Stratification   Activity Barriers  Left Hip Replacement;Right Hip Replacement;Arthritis;Other (comment)    Comments  Right shoulder repair, left knee arthritis.    Cardiac Risk Stratification  Moderate       6 Minute Walk: 6 Minute Walk    Row Name 08/13/18 0827         6 Minute Walk   Phase  Initial     Distance  1252 feet     Walk Time  6 minutes     # of Rest Breaks  0     MPH  2.37     METS  2.47      RPE  11     Perceived Dyspnea   1     VO2 Peak  8.63     Symptoms  Yes (comment)     Comments  Patient c/o mild dyspnea.     Resting HR  78 bpm     Resting BP  132/72     Resting  Oxygen Saturation   97 %     Exercise Oxygen Saturation  during 6 min walk  97 %     Max Ex. HR  110 bpm     Max Ex. BP  160/70     2 Minute Post BP  122/72        Oxygen Initial Assessment:   Oxygen Re-Evaluation:   Oxygen Discharge (Final Oxygen Re-Evaluation):   Initial Exercise Prescription: Initial Exercise Prescription - 08/13/18 1000      Date of Initial Exercise RX and Referring Provider   Date  08/13/18    Referring Provider  Minus Breeding, MD    Expected Discharge Date  11/15/18      Recumbant Bike   Level  2    Watts  15    Minutes  10    METs  2.45      NuStep   Level  2    SPM  85    Minutes  10    METs  2.4      Track   Laps  9    Minutes  10    METs  2.57      Prescription Details   Frequency (times per week)  3    Duration  Progress to 30 minutes of continuous aerobic without signs/symptoms of physical distress      Intensity   THRR 40-80% of Max Heartrate  58-117    Ratings of Perceived Exertion  11-13    Perceived Dyspnea  0-4      Progression   Progression  Continue to progress workloads to maintain intensity without signs/symptoms of physical distress.      Resistance Training   Training Prescription  Yes    Weight  3lbs    Reps  10-15       Perform Capillary Blood Glucose checks as needed.  Exercise Prescription Changes:  Exercise Prescription Changes    Row Name 08/23/18 1452 08/28/18 1510           Response to Exercise   Blood Pressure (Admit)  148/82  132/74      Blood Pressure (Exercise)  148/70  154/72      Blood Pressure (Exit)  110/70  140/80      Heart Rate (Admit)  92 bpm  89 bpm      Heart Rate (Exercise)  108 bpm  109 bpm      Heart Rate (Exit)  98 bpm  85 bpm      Rating of Perceived Exertion (Exercise)  13  11       Symptoms  none  none      Comments  Switched from RB to AE b/c of lt knee discomfort.  -      Duration  Progress to 30 minutes of  aerobic without signs/symptoms of physical distress  Progress to 30 minutes of  aerobic without signs/symptoms of physical distress      Intensity  THRR unchanged  THRR unchanged        Progression   Progression  Continue to progress workloads to maintain intensity without signs/symptoms of physical distress.  Continue to progress workloads to maintain intensity without signs/symptoms of physical distress.      Average METs  2  2.3        Resistance Training   Training Prescription  Yes  No Relaxation day, no weights.      Weight  3lbs  -      Reps  10-15  -      Time  10 Minutes  -        Interval Training   Interval Training  No  No        Recumbant Bike   Level  -  -      Watts  -  -      Minutes  -  -      METs  -  -        NuStep   Level  2  3      SPM  85  85      Minutes  10  10      METs  1.7  2.1        Arm Ergometer   Level  1  2      Minutes  10  10      METs  1.7  1.9        Track   Laps  9  11      Minutes  10  10      METs  2.57  2.91        Home Exercise Plan   Plans to continue exercise at  -  Home (comment) Walking      Frequency  -  Add 4 additional days to program exercise sessions.      Initial Home Exercises Provided  -  08/28/18         Exercise Comments:  Exercise Comments    Row Name 08/23/18 1548 08/28/18 1540         Exercise Comments  Patient tolerated low intensity exercise well. Some left knee discomfort on recumbent bike, so pt moved to arm ergometer instead.  Reviewed home exercise guidelines, METs, and goals with patient.         Exercise Goals and Review:  Exercise Goals    Row Name 08/13/18 1023             Exercise Goals   Increase Physical Activity  Yes       Intervention  Provide advice, education, support and counseling about physical activity/exercise needs.;Develop an  individualized exercise prescription for aerobic and resistive training based on initial evaluation findings, risk stratification, comorbidities and participant's personal goals.       Expected Outcomes  Short Term: Attend rehab on a regular basis to increase amount of physical activity.;Long Term: Add in home exercise to make exercise part of routine and to increase amount of physical activity.;Long Term: Exercising regularly at least 3-5 days a week.       Increase Strength and Stamina  Yes       Intervention  Provide advice, education, support and counseling about physical activity/exercise needs.;Develop an individualized exercise prescription for aerobic and resistive training based on initial evaluation findings, risk stratification, comorbidities and participant's personal goals.       Expected Outcomes  Short Term: Increase workloads from initial exercise prescription for resistance, speed, and METs.;Short Term: Perform resistance training exercises routinely during rehab and add in resistance training at home;Long Term: Improve cardiorespiratory fitness, muscular endurance and strength as measured by increased METs and functional capacity (6MWT)       Able to understand and use rate of perceived exertion (RPE) scale  Yes       Intervention  Provide education and explanation on how to use RPE scale       Expected Outcomes  Short Term: Able to use RPE daily  in rehab to express subjective intensity level;Long Term:  Able to use RPE to guide intensity level when exercising independently       Knowledge and understanding of Target Heart Rate Range (THRR)  Yes       Intervention  Provide education and explanation of THRR including how the numbers were predicted and where they are located for reference       Expected Outcomes  Short Term: Able to state/look up THRR;Long Term: Able to use THRR to govern intensity when exercising independently;Short Term: Able to use daily as guideline for intensity in  rehab       Able to check pulse independently  Yes       Intervention  Provide education and demonstration on how to check pulse in carotid and radial arteries.;Review the importance of being able to check your own pulse for safety during independent exercise       Expected Outcomes  Short Term: Able to explain why pulse checking is important during independent exercise;Long Term: Able to check pulse independently and accurately       Understanding of Exercise Prescription  Yes       Intervention  Provide education, explanation, and written materials on patient's individual exercise prescription       Expected Outcomes  Short Term: Able to explain program exercise prescription;Long Term: Able to explain home exercise prescription to exercise independently          Exercise Goals Re-Evaluation : Exercise Goals Re-Evaluation    Row Name 08/23/18 1548 08/28/18 1540           Exercise Goal Re-Evaluation   Exercise Goals Review  Increase Physical Activity;Able to understand and use rate of perceived exertion (RPE) scale  Increase Physical Activity;Able to understand and use rate of perceived exertion (RPE) scale;Knowledge and understanding of Target Heart Rate Range (THRR);Able to check pulse independently;Understanding of Exercise Prescription      Comments  Patient able to understand and use RPE scale appropriately. Patient switched from recumbent bike to arm ergometer because of left knee discomfort.  Reviewed home exercise guidelines with patient including THRR, RPE scale, and endpoints for exercise. Pt is walking 20 minutes daily, weather permitting, as his mode of home exercise. Pt is agreeable to increasing exercise duration from 20 to 30 minutes by adding a minute each session until reaching goal. pt attended pulse coutning class and is able to check his pulse independently.      Expected Outcomes  Progress workloads as tolerated to help improve cardiorespiratory fitness.  Patient will  increase walking duration at home from 20 minutes daily to 30 minutes daily to meet recommended guidelines for exercise.         Discharge Exercise Prescription (Final Exercise Prescription Changes): Exercise Prescription Changes - 08/28/18 1510      Response to Exercise   Blood Pressure (Admit)  132/74    Blood Pressure (Exercise)  154/72    Blood Pressure (Exit)  140/80    Heart Rate (Admit)  89 bpm    Heart Rate (Exercise)  109 bpm    Heart Rate (Exit)  85 bpm    Rating of Perceived Exertion (Exercise)  11    Symptoms  none    Comments  --    Duration  Progress to 30 minutes of  aerobic without signs/symptoms of physical distress    Intensity  THRR unchanged      Progression   Progression  Continue to progress workloads to maintain  intensity without signs/symptoms of physical distress.    Average METs  2.3      Resistance Training   Training Prescription  No   Relaxation day, no weights.   Weight  --    Reps  --    Time  --      Interval Training   Interval Training  No      NuStep   Level  3    SPM  85    Minutes  10    METs  2.1      Arm Ergometer   Level  2    Minutes  10    METs  1.9      Track   Laps  11    Minutes  10    METs  2.91      Home Exercise Plan   Plans to continue exercise at  Home (comment)   Walking   Frequency  Add 4 additional days to program exercise sessions.    Initial Home Exercises Provided  08/28/18       Nutrition:  Target Goals: Understanding of nutrition guidelines, daily intake of sodium 1500mg , cholesterol 200mg , calories 30% from fat and 7% or less from saturated fats, daily to have 5 or more servings of fruits and vegetables.  Biometrics: Pre Biometrics - 08/13/18 0803      Pre Biometrics   Height  5' 7.5" (1.715 m)    Weight  230 lb 9.6 oz (104.6 kg)    Waist Circumference  48 inches    Hip Circumference  45.5 inches    Waist to Hip Ratio  1.05 %    BMI (Calculated)  35.56    Triceps Skinfold  30 mm    %  Body Fat  37.5 %    Grip Strength  26 kg    Flexibility  0 in    Single Leg Stand  13.06 seconds        Nutrition Therapy Plan and Nutrition Goals: Nutrition Therapy & Goals - 08/13/18 1538      Nutrition Therapy   Diet  heart healthy, carb modified      Personal Nutrition Goals   Nutrition Goal  Pt to identify and limit food sources of saturated fat, trans fat, refined carbohydrates and sodium    Personal Goal #2  Pt to identify food quantities necessary to achieve weight loss of 6-24 lb at graduation from cardiac rehab.     Personal Goal #3  Pt to build a healthy plate including vegetables, fruits, whole grains, and low-fat dairy products in a heart healthy meal plan.    Personal Goal #4  Pt to weigh and measure serving sizes      Intervention Plan   Intervention  Prescribe, educate and counsel regarding individualized specific dietary modifications aiming towards targeted core components such as weight, hypertension, lipid management, diabetes, heart failure and other comorbidities.    Expected Outcomes  Short Term Goal: Understand basic principles of dietary content, such as calories, fat, sodium, cholesterol and nutrients.;Long Term Goal: Adherence to prescribed nutrition plan.       Nutrition Assessments:   Nutrition Goals Re-Evaluation: Nutrition Goals Re-Evaluation    [place] Name 08/13/18 1538             Goals   Current Weight  230 lb 9.6 oz (104.6 kg)          Nutrition Goals Re-Evaluation: Nutrition Goals Re-Evaluation    Row Name 08/13/18 6433  Goals   Current Weight  230 lb 9.6 oz (104.6 kg)          Nutrition Goals Discharge (Final Nutrition Goals Re-Evaluation): Nutrition Goals Re-Evaluation - 08/13/18 1538      Goals   Current Weight  230 lb 9.6 oz (104.6 kg)       Psychosocial: Target Goals: Acknowledge presence or absence of significant depression and/or stress, maximize coping skills, provide positive support system.  Participant is able to verbalize types and ability to use techniques and skills needed for reducing stress and depression.  Initial Review & Psychosocial Screening: Initial Psych Review & Screening - 08/13/18 1114      Initial Review   Current issues with  None Identified      Family Dynamics   Good Support System?  Yes   Hiroki has his wife, pastor and friends for support     Barriers   Psychosocial barriers to participate in program  There are no identifiable barriers or psychosocial needs.      Screening Interventions   Interventions  Encouraged to exercise       Quality of Life Scores: Quality of Life - 08/13/18 1028      Quality of Life   Select  Quality of Life      Quality of Life Scores   Health/Function Pre  25.53 %    Socioeconomic Pre  27.5 %    Psych/Spiritual Pre  29.14 %    Family Pre  30 %    GLOBAL Pre  27.34 %      Scores of 19 and below usually indicate a poorer quality of life in these areas.  A difference of  2-3 points is a clinically meaningful difference.  A difference of 2-3 points in the total score of the Quality of Life Index has been associated with significant improvement in overall quality of life, self-image, physical symptoms, and general health in studies assessing change in quality of life.  PHQ-9: Recent Review Flowsheet Data    Depression screen [facility] 2/9 08/23/2018 07/23/2018 07/01/2018 03/27/2018 02/20/2018   Decreased Interest 0 0 0 0 0   Down, Depressed, Hopeless 0 0 0 0 0   PHQ - 2 Score 0 0 0 0 0     Interpretation of Total Score  Total Score Depression Severity:  1-4 = Minimal depression, 5-9 = Mild depression, 10-14 = Moderate depression, 15-19 = Moderately severe depression, 20-27 = Severe depression   Psychosocial Evaluation and Intervention:   Psychosocial Re-Evaluation:   Psychosocial Discharge (Final Psychosocial Re-Evaluation):   Vocational Rehabilitation: Provide vocational rehab assistance to qualifying  candidates.   Vocational Rehab Evaluation & Intervention: Vocational Rehab - 08/13/18 1115      Initial Vocational Rehab Evaluation & Intervention   Assessment shows need for Vocational Rehabilitation  No   Melecio is retired and does not need vocational rehab at this time      Education: Education Goals: Education classes will be provided on a weekly basis, covering required topics. Participant will state understanding/return demonstration of topics presented.  Learning Barriers/Preferences: Learning Barriers/Preferences - 08/13/18 1047      Learning Barriers/Preferences   Learning Barriers  None    Learning Preferences  Written Material;Video;Skilled Demonstration;Pictoral       Education Topics: Count Your Pulse:  -Group instruction provided by verbal instruction, demonstration, patient participation and written materials to support subject.  Instructors address importance of being able to find your pulse and how to count your pulse when  at home without a heart monitor.  Patients get hands on experience counting their pulse with staff help and individually.   Heart Attack, Angina, and Risk Factor Modification:  -Group instruction provided by verbal instruction, video, and written materials to support subject.  Instructors address signs and symptoms of angina and heart attacks.    Also discuss risk factors for heart disease and how to make changes to improve heart health risk factors.   Functional Fitness:  -Group instruction provided by verbal instruction, demonstration, patient participation, and written materials to support subject.  Instructors address safety measures for doing things around the house.  Discuss how to get up and down off the floor, how to pick things up properly, how to safely get out of a chair without assistance, and balance training.   Meditation and Mindfulness:  -Group instruction provided by verbal instruction, patient participation, and written  materials to support subject.  Instructor addresses importance of mindfulness and meditation practice to help reduce stress and improve awareness.  Instructor also leads participants through a meditation exercise.    Stretching for Flexibility and Mobility:  -Group instruction provided by verbal instruction, patient participation, and written materials to support subject.  Instructors lead participants through series of stretches that are designed to increase flexibility thus improving mobility.  These stretches are additional exercise for major muscle groups that are typically performed during regular warm up and cool down.   Hands Only CPR:  -Group verbal, video, and participation provides a basic overview of AHA guidelines for community CPR. Role-play of emergencies allow participants the opportunity to practice calling for help and chest compression technique with discussion of AED use.   Hypertension: -Group verbal and written instruction that provides a basic overview of hypertension including the most recent diagnostic guidelines, risk factor reduction with self-care instructions and medication management.    Nutrition I class: Heart Healthy Eating:  -Group instruction provided by PowerPoint slides, verbal discussion, and written materials to support subject matter. The instructor gives an explanation and review of the Therapeutic Lifestyle Changes diet recommendations, which includes a discussion on lipid goals, dietary fat, sodium, fiber, plant stanol/sterol esters, sugar, and the components of a well-balanced, healthy diet.   Nutrition II class: Lifestyle Skills:  -Group instruction provided by PowerPoint slides, verbal discussion, and written materials to support subject matter. The instructor gives an explanation and review of label reading, grocery shopping for heart health, heart healthy recipe modifications, and ways to make healthier choices when eating out.   Diabetes  Question & Answer:  -Group instruction provided by PowerPoint slides, verbal discussion, and written materials to support subject matter. The instructor gives an explanation and review of diabetes co-morbidities, pre- and post-prandial blood glucose goals, pre-exercise blood glucose goals, signs, symptoms, and treatment of hypoglycemia and hyperglycemia, and foot care basics.   Diabetes Blitz:  -Group instruction provided by PowerPoint slides, verbal discussion, and written materials to support subject matter. The instructor gives an explanation and review of the physiology behind type 1 and type 2 diabetes, diabetes medications and rational behind using different medications, pre- and post-prandial blood glucose recommendations and Hemoglobin A1c goals, diabetes diet, and exercise including blood glucose guidelines for exercising safely.    Portion Distortion:  -Group instruction provided by PowerPoint slides, verbal discussion, written materials, and food models to support subject matter. The instructor gives an explanation of serving size versus portion size, changes in portions sizes over the last 20 years, and what consists of a serving from each  food group.   Stress Management:  -Group instruction provided by verbal instruction, video, and written materials to support subject matter.  Instructors review role of stress in heart disease and how to cope with stress positively.     Exercising on Your Own:  -Group instruction provided by verbal instruction, power point, and written materials to support subject.  Instructors discuss benefits of exercise, components of exercise, frequency and intensity of exercise, and end points for exercise.  Also discuss use of nitroglycerin and activating EMS.  Review options of places to exercise outside of rehab.  Review guidelines for sex with heart disease.   CARDIAC REHAB PHASE II EXERCISE from 08/21/2018 in [place]  Date   08/21/18  Instruction Review Code  2- Demonstrated Understanding      Cardiac Drugs I:  -Group instruction provided by verbal instruction and written materials to support subject.  Instructor reviews cardiac drug classes: antiplatelets, anticoagulants, beta blockers, and statins.  Instructor discusses reasons, side effects, and lifestyle considerations for each drug class.   Cardiac Drugs II:  -Group instruction provided by verbal instruction and written materials to support subject.  Instructor reviews cardiac drug classes: angiotensin converting enzyme inhibitors (ACE-I), angiotensin II receptor blockers (ARBs), nitrates, and calcium channel blockers.  Instructor discusses reasons, side effects, and lifestyle considerations for each drug class.   Anatomy and Physiology of the Circulatory System:  Group verbal and written instruction and models provide basic cardiac anatomy and physiology, with the coronary electrical and arterial systems. Review of: AMI, Angina, Valve disease, Heart Failure, Peripheral Artery Disease, Cardiac Arrhythmia, Pacemakers, and the ICD.   Other Education:  -Group or individual verbal, written, or video instructions that support the educational goals of the cardiac rehab program.   Holiday Eating Survival Tips:  -Group instruction provided by PowerPoint slides, verbal discussion, and written materials to support subject matter. The instructor gives patients tips, tricks, and techniques to help them not only survive but enjoy the holidays despite the onslaught of food that accompanies the holidays.   Knowledge Questionnaire Score: Knowledge Questionnaire Score - 08/13/18 1026      Knowledge Questionnaire Score   Pre Score  20/24       Core Components/Risk Factors/Patient Goals at Admission: Personal Goals and Risk Factors at Admission - 08/13/18 1035      Core Components/Risk Factors/Patient Goals on Admission    Weight Management  Yes;Obesity     Intervention  Weight Management: Develop a combined nutrition and exercise program designed to reach desired caloric intake, while maintaining appropriate intake of nutrient and fiber, sodium and fats, and appropriate energy expenditure required for the weight goal.;Weight Management: Provide education and appropriate resources to help participant work on and attain dietary goals.;Weight Management/Obesity: Establish reasonable short term and long term weight goals.;Obesity: Provide education and appropriate resources to help participant work on and attain dietary goals.    Admit Weight  230 lb 9.6 oz (104.6 kg)    Expected Outcomes  Weight Loss: Understanding of general recommendations for a balanced deficit meal plan, which promotes 1-2 lb weight loss per week and includes a negative energy balance of [phone] kcal/d;Short Term: Continue to assess and modify interventions until short term weight is achieved;Long Term: Adherence to nutrition and physical activity/exercise program aimed toward attainment of established weight goal;Understanding recommendations for meals to include 15-35% energy as protein, 25-35% energy from fat, 35-60% energy from carbohydrates, less than 200mg  of dietary cholesterol, 20-35 gm of total fiber  daily;Understanding of distribution of calorie intake throughout the day with the consumption of 4-5 meals/snacks    Diabetes  Yes    Intervention  Provide education about signs/symptoms and action to take for hypo/hyperglycemia.;Provide education about proper nutrition, including hydration, and aerobic/resistive exercise prescription along with prescribed medications to achieve blood glucose in normal ranges: Fasting glucose 65-99 mg/dL    Expected Outcomes  Short Term: Participant verbalizes understanding of the signs/symptoms and immediate care of hyper/hypoglycemia, proper foot care and importance of medication, aerobic/resistive exercise and nutrition plan for blood glucose  control.;Long Term: Attainment of HbA1C < 7%.    Hypertension  Yes    Intervention  Provide education on lifestyle modifcations including regular physical activity/exercise, weight management, moderate sodium restriction and increased consumption of fresh fruit, vegetables, and low fat dairy, alcohol moderation, and smoking cessation.;Monitor prescription use compliance.    Expected Outcomes  Short Term: Continued assessment and intervention until BP is < 140/38mm HG in hypertensive participants. < 130/59mm HG in hypertensive participants with diabetes, heart failure or chronic kidney disease.;Long Term: Maintenance of blood pressure at goal levels.    Lipids  Yes    Intervention  Provide education and support for participant on nutrition & aerobic/resistive exercise along with prescribed medications to achieve LDL 70mg , HDL >40mg .    Expected Outcomes  Short Term: Participant states understanding of desired cholesterol values and is compliant with medications prescribed. Participant is following exercise prescription and nutrition guidelines.;Long Term: Cholesterol controlled with medications as prescribed, with individualized exercise RX and with personalized nutrition plan. Value goals: LDL < 70mg , HDL > 40 mg.       Core Components/Risk Factors/Patient Goals Review:    Core Components/Risk Factors/Patient Goals at Discharge (Final Review):    ITP Comments: ITP Comments    Row Name 08/13/18 0807 08/23/18 1459         ITP Comments  Medical Director- Dr. Fransico Him, MD  30 Day ITP Review. Today was Hazim first day of exercise         Comments: Vanessa started cardiac rehab today.  Pt tolerated light exercise without difficulty. VSS, telemetry-Sinus R, asymptomatic.  Medication list reconciled. Pt denies barriers to medicaiton compliance.  PSYCHOSOCIAL ASSESSMENT:  PHQ-0. Pt exhibits positive coping skills, hopeful outlook with supportive family. No psychosocial needs identified at this  time, no psychosocial interventions necessary.    Pt enjoys working with his wife outside .   Pt oriented to exercise equipment and routine.    Understanding verbalized.Barnet Pall, RN,BSN 08/29/2018 10:47 AM

## 2018-08-26 ENCOUNTER — Ambulatory Visit (HOSPITAL_COMMUNITY): Payer: Medicare Other

## 2018-08-26 ENCOUNTER — Encounter (HOSPITAL_COMMUNITY)
Admission: RE | Admit: 2018-08-26 | Discharge: 2018-08-26 | Disposition: A | Discharge status: Home / Self Care | Payer: Medicare Other | Source: Ambulatory Visit | Attending: Cardiology | Admitting: Cardiology

## 2018-08-26 DIAGNOSIS — Z955 Presence of coronary angioplasty implant and graft: Secondary | ICD-10-CM | POA: Insufficient documentation

## 2018-08-26 LAB — GLUCOSE, CAPILLARY: Glucose-Capillary: 183 mg/dL — ABNORMAL HIGH (ref 70–99)

## 2018-08-28 ENCOUNTER — Encounter (HOSPITAL_COMMUNITY)
Admission: RE | Admit: 2018-08-28 | Discharge: 2018-08-28 | Disposition: A | Discharge status: Home / Self Care | Payer: Medicare Other | Source: Ambulatory Visit | Attending: Cardiology | Admitting: Cardiology

## 2018-08-28 ENCOUNTER — Ambulatory Visit (HOSPITAL_COMMUNITY): Payer: Medicare Other

## 2018-08-28 DIAGNOSIS — Z955 Presence of coronary angioplasty implant and graft: Secondary | ICD-10-CM

## 2018-08-28 LAB — GLUCOSE, CAPILLARY: GLUCOSE-CAPILLARY: 157 mg/dL — AB (ref 70–99)

## 2018-08-28 NOTE — Progress Notes (Signed)
Reviewed home exercise guidelines with patient including endpoints, temperature precautions, target heart rate and rate of perceived exertion. Pt is walking 20 minutes daily as his mode of home exercise. Encouraged patient to increase walking duration from 20 minutes to 30 minutes daily, and pt is agreeable to this plan. Pt voices understanding of instructions given.

## 2018-08-30 ENCOUNTER — Ambulatory Visit (HOSPITAL_COMMUNITY): Payer: Medicare Other

## 2018-08-30 ENCOUNTER — Other Ambulatory Visit: Payer: Self-pay

## 2018-08-30 ENCOUNTER — Encounter (HOSPITAL_COMMUNITY)
Admission: RE | Admit: 2018-08-30 | Discharge: 2018-08-30 | Disposition: A | Discharge status: Home / Self Care | Payer: Medicare Other | Source: Ambulatory Visit | Attending: Cardiology | Admitting: Cardiology

## 2018-08-30 DIAGNOSIS — Z955 Presence of coronary angioplasty implant and graft: Secondary | ICD-10-CM

## 2018-08-30 MED ORDER — NITROGLYCERIN 0.4 MG SL SUBL: 0.4000 mg | SUBLINGUAL_TABLET | SUBLINGUAL | 3 refills | Status: DC | PRN

## 2018-08-30 MED ORDER — CARVEDILOL 3.125 MG PO TABS: 3.1250 mg | ORAL_TABLET | Freq: Two times a day (BID) | ORAL | 3 refills | Status: DC

## 2018-08-30 NOTE — Telephone Encounter (Signed)
Rx(s) sent to pharmacy electronically.  

## 2018-09-02 ENCOUNTER — Ambulatory Visit (HOSPITAL_COMMUNITY): Payer: Medicare Other

## 2018-09-02 ENCOUNTER — Encounter (HOSPITAL_COMMUNITY)
Admission: RE | Admit: 2018-09-02 | Discharge: 2018-09-02 | Disposition: A | Discharge status: Home / Self Care | Payer: Medicare Other | Source: Ambulatory Visit | Attending: Cardiology | Admitting: Cardiology

## 2018-09-02 DIAGNOSIS — Z955 Presence of coronary angioplasty implant and graft: Secondary | ICD-10-CM

## 2018-09-02 LAB — GLUCOSE, CAPILLARY: Glucose-Capillary: 202 mg/dL — ABNORMAL HIGH (ref 70–99)

## 2018-09-04 ENCOUNTER — Encounter (HOSPITAL_COMMUNITY)
Admission: RE | Admit: 2018-09-04 | Discharge: 2018-09-04 | Disposition: A | Discharge status: Home / Self Care | Payer: Medicare Other | Source: Ambulatory Visit | Attending: Cardiology | Admitting: Cardiology

## 2018-09-04 ENCOUNTER — Other Ambulatory Visit: Payer: Self-pay

## 2018-09-04 ENCOUNTER — Ambulatory Visit (HOSPITAL_COMMUNITY): Payer: Medicare Other

## 2018-09-04 DIAGNOSIS — Z955 Presence of coronary angioplasty implant and graft: Secondary | ICD-10-CM

## 2018-09-06 ENCOUNTER — Ambulatory Visit (HOSPITAL_COMMUNITY): Payer: Medicare Other

## 2018-09-06 ENCOUNTER — Encounter (HOSPITAL_COMMUNITY)
Admission: RE | Admit: 2018-09-06 | Discharge: 2018-09-06 | Disposition: A | Discharge status: Home / Self Care | Payer: Medicare Other | Source: Ambulatory Visit | Attending: Cardiology | Admitting: Cardiology

## 2018-09-06 ENCOUNTER — Other Ambulatory Visit: Payer: Self-pay

## 2018-09-06 DIAGNOSIS — Z955 Presence of coronary angioplasty implant and graft: Secondary | ICD-10-CM | POA: Diagnosis not present

## 2018-09-09 ENCOUNTER — Ambulatory Visit (HOSPITAL_COMMUNITY): Payer: Medicare Other

## 2018-09-09 ENCOUNTER — Encounter (HOSPITAL_COMMUNITY): Payer: Medicare Other

## 2018-09-09 ENCOUNTER — Telehealth (HOSPITAL_COMMUNITY): Payer: Self-pay | Admitting: *Deleted

## 2018-09-11 ENCOUNTER — Encounter (HOSPITAL_COMMUNITY): Payer: Medicare Other

## 2018-09-11 ENCOUNTER — Ambulatory Visit (HOSPITAL_COMMUNITY): Payer: Medicare Other

## 2018-09-13 ENCOUNTER — Ambulatory Visit (HOSPITAL_COMMUNITY): Payer: Medicare Other

## 2018-09-13 ENCOUNTER — Encounter (HOSPITAL_COMMUNITY): Payer: Medicare Other

## 2018-09-13 ENCOUNTER — Encounter (HOSPITAL_COMMUNITY): Payer: Self-pay | Admitting: *Deleted

## 2018-09-13 DIAGNOSIS — Z955 Presence of coronary angioplasty implant and graft: Secondary | ICD-10-CM

## 2018-09-16 ENCOUNTER — Ambulatory Visit (HOSPITAL_COMMUNITY): Payer: Medicare Other

## 2018-09-16 ENCOUNTER — Encounter (HOSPITAL_COMMUNITY): Payer: Medicare Other

## 2018-09-17 ENCOUNTER — Other Ambulatory Visit: Payer: Self-pay | Admitting: Family Medicine

## 2018-09-18 ENCOUNTER — Ambulatory Visit (HOSPITAL_COMMUNITY): Payer: Medicare Other

## 2018-09-18 ENCOUNTER — Encounter (HOSPITAL_COMMUNITY): Payer: Self-pay | Admitting: *Deleted

## 2018-09-18 ENCOUNTER — Encounter (HOSPITAL_COMMUNITY): Payer: Medicare Other

## 2018-09-18 NOTE — Progress Notes (Signed)
Cardiac Individual Treatment Plan  Patient Details  Name: Marvin Frost MRN: [id number] Date of Birth: 1945-08-16 Referring Provider:     [place] from 08/13/2018 in [place]  Referring Provider  Minus Breeding, MD      Initial Encounter Date:    CARDIAC REHAB PHASE II ORIENTATION from 08/13/2018 in [place]  Date  08/13/18      Visit Diagnosis: No diagnosis found.  Patient's Home Medications on Admission:  Current Outpatient Medications:  .  amoxicillin (AMOXIL) 500 MG capsule, Take 4 capsules (2,000 mg total) by mouth See admin instructions. Take four capsules (2000 mg) by mouth one hour prior to dental appointments (Patient not taking: Reported on 08/20/2018), Disp: 4 capsule, Rfl: 5 .  aspirin EC 81 MG EC tablet, Take 1 tablet (81 mg total) by mouth daily., Disp: , Rfl:  .  BENICAR HCT 40-25 MG tablet, TAKE 1 TABLET DAILY, Disp: 90 tablet, Rfl: 1 .  Blood Glucose Monitoring Suppl (FREESTYLE LITE) DEVI, Use to check BS bid. DX. E11.9, Disp: 1 each, Rfl: 0 .  carvedilol (COREG) 3.125 MG tablet, Take 1 tablet (3.125 mg total) by mouth 2 (two) times daily., Disp: 180 tablet, Rfl: 3 .  cetirizine (ZYRTEC) 10 MG tablet, Take 10 mg by mouth as needed for allergies or rhinitis. , Disp: , Rfl:  .  clopidogrel (PLAVIX) 75 MG tablet, Take 75 mg by mouth daily., Disp: , Rfl:  .  Empagliflozin-metFORMIN HCl (SYNJARDY) 12.10-998 MG TABS, Take 1 tablet by mouth 2 (two) times daily., Disp: 180 tablet, Rfl: 3 .  ferrous sulfate 325 (65 FE) MG tablet, Take 1 tablet (325 mg total) by mouth every other day. (Patient taking differently: Take 325 mg by mouth every morning. ), Disp: 90 tablet, Rfl: 1 .  glucose blood (FREESTYLE LITE) test strip, USE TO CHECK BLOOD SUGAR TWICE A DAY AND AS NEEDED, Disp: 300 each, Rfl: 3 .  insulin aspart (NOVOLOG FLEXPEN) 100 UNIT/ML FlexPen, INJECT 16 TO 20 UNITS UNDER THE SKIN TWICE  A DAY (Patient taking differently: Inject 10-22 Units into the skin See admin instructions. Inject 22 units subcutaneously before breakfast and after supper, inject 10-15 units at bedtime per sliding scale: CBG <200 10 - 12 units, >200 15 units), Disp: 45 mL, Rfl: 3 .  Insulin Degludec (TRESIBA FLEXTOUCH) 200 UNIT/ML SOPN, Inject 60-70 Units into the skin daily. (Patient taking differently: Inject 72 Units into the skin at bedtime. ), Disp: 60 mL, Rfl: 3 .  Insulin Pen Needle (NOVOTWIST) 32G X 5 MM MISC, USE WITH INSULIN PENS UP TO 5 TIMES DAILY, Disp: 300 each, Rfl: 9 .  Lancets (FREESTYLE) lancets, Check BS BID and PRN. DX.E11.9, Disp: 300 each, Rfl: 3 .  nitroGLYCERIN (NITROSTAT) 0.4 MG SL tablet, Place 1 tablet (0.4 mg total) under the tongue every 5 (five) minutes as needed for chest pain., Disp: 25 tablet, Rfl: 3 .  pantoprazole (PROTONIX) 40 MG tablet, Take 1 tablet (40 mg total) by mouth 2 (two) times daily after a meal., Disp: 180 tablet, Rfl: 2 .  REPATHA SURECLICK 408 MG/ML SOAJ, INJECT 140 MG UNDER THE SKIN EVERY 14 DAYS, Disp: 6 mL, Rfl: 4 .  rosuvastatin (CRESTOR) 20 MG tablet, Take 1 tablet (20 mg total) by mouth daily. (Patient taking differently: Take 20 mg by mouth every other day. Taking every 3 days), Disp: 90 tablet, Rfl: 4  Past Medical History: Past Medical History:  Diagnosis Date  . Allergy   . Arthritis   . CAD (coronary artery disease)   . 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 Cataract   . Diabetes mellitus   . Dysrhythmia    history of heart palpitations   . Gastric polyps 09/18/2017  . GERD (gastroesophageal reflux disease)   . Heart attack ([place])   . History of frequent urinary tract infections   . History of hiatal hernia   . Hyperlipemia   . Hypertension   . Obesity   . Pancreatitis     Tobacco Use: Social History   Tobacco Use  Smoking Status Former Smoker  . Packs/day: 1.00  . Years: 10.00  . Pack years: 10.00  .  Types: Cigarettes  . Last attempt to quit: 04/06/1984  . Years since quitting: 34.4  Smokeless Tobacco Former Systems developer  . Types: Chew  . Quit date: 06/26/1978    Labs: Recent Review Flowsheet Data    Labs for ITP Cardiac and Pulmonary Rehab Latest Ref Rng & Units 10/15/2017 02/19/2018 05/27/2018 07/09/2018 08/19/2018   Cholestrol 100 - 199 mg/dL 206(H) 78(L) - 184 112   LDLCALC 0 - 99 mg/dL 131(H) 9 - 98 26   LDLDIRECT 0 - 99 mg/dL - - - - -   HDL >39 mg/dL 52 38(L) - 39(L) 46   Trlycerides 0 - 149 mg/dL 116 155(H) - 237(H) 201(H)   Hemoglobin A1c <7.0 % 8.1(H) 7.8(H) - 7.1(H) -   TCO2 22 - 32 mmol/L - - 25 - -      Capillary Blood Glucose: Lab Results  Component Value Date   GLUCAP 157 (H) 08/28/2018   GLUCAP 183 (H) 08/26/2018   GLUCAP 202 (H) 08/26/2018   GLUCAP 165 (H) 08/23/2018   GLUCAP 168 (H) 08/23/2018     Exercise Target Goals: Exercise Program Goal: Individual exercise prescription set using results from initial 6 min walk test and THRR while considering  patient's activity barriers and safety.   Exercise Prescription Goal: Initial exercise prescription builds to 30-45 minutes a day of aerobic activity, 2-3 days per week.  Home exercise guidelines will be given to patient during program as part of exercise prescription that the participant will acknowledge.  Activity Barriers & Risk Stratification: Activity Barriers & Cardiac Risk Stratification - 08/13/18 1022      Activity Barriers & Cardiac Risk Stratification   Activity Barriers  Left Hip Replacement;Right Hip Replacement;Arthritis;Other (comment)    Comments  Right shoulder repair, left knee arthritis.    Cardiac Risk Stratification  Moderate       6 Minute Walk: 6 Minute Walk    Row Name 08/13/18 0827         6 Minute Walk   Phase  Initial     Distance  1252 feet     Walk Time  6 minutes     # of Rest Breaks  0     MPH  2.37     METS  2.47     RPE  11     Perceived Dyspnea   1     VO2 Peak  8.63      Symptoms  Yes (comment)     Comments  Patient c/o mild dyspnea.     Resting HR  78 bpm     Resting BP  132/72     Resting Oxygen Saturation   97 %     Exercise  Oxygen Saturation  during 6 min walk  97 %     Max Ex. HR  110 bpm     Max Ex. BP  160/70     2 Minute Post BP  122/72        Oxygen Initial Assessment:   Oxygen Re-Evaluation:   Oxygen Discharge (Final Oxygen Re-Evaluation):   Initial Exercise Prescription: Initial Exercise Prescription - 08/13/18 1000      Date of Initial Exercise RX and Referring Provider   Date  08/13/18    Referring Provider  Minus Breeding, MD    Expected Discharge Date  11/15/18      Recumbant Bike   Level  2    Watts  15    Minutes  10    METs  2.45      NuStep   Level  2    SPM  85    Minutes  10    METs  2.4      Track   Laps  9    Minutes  10    METs  2.57      Prescription Details   Frequency (times per week)  3    Duration  Progress to 30 minutes of continuous aerobic without signs/symptoms of physical distress      Intensity   THRR 40-80% of Max Heartrate  58-117    Ratings of Perceived Exertion  11-13    Perceived Dyspnea  0-4      Progression   Progression  Continue to progress workloads to maintain intensity without signs/symptoms of physical distress.      Resistance Training   Training Prescription  Yes    Weight  3lbs    Reps  10-15       Perform Capillary Blood Glucose checks as needed.  Exercise Prescription Changes: Exercise Prescription Changes    Row Name 08/23/18 1452 08/28/18 1510 09/02/18 1455 09/06/18 1458       Response to Exercise   Blood Pressure (Admit)  148/82  132/74  122/60  138/68    Blood Pressure (Exercise)  148/70  154/72  138/78  148/78    Blood Pressure (Exit)  110/70  140/80  122/60  120/60    Heart Rate (Admit)  92 bpm  89 bpm  82 bpm  85 bpm    Heart Rate (Exercise)  108 bpm  109 bpm  102 bpm  103 bpm    Heart Rate (Exit)  98 bpm  85 bpm  81 bpm  85 bpm    Rating of  Perceived Exertion (Exercise)  13  11  11  11     Symptoms  none  none  none  none    Comments  Switched from RB to AE b/c of lt knee discomfort.  -  -  -    Duration  Progress to 30 minutes of  aerobic without signs/symptoms of physical distress  Progress to 30 minutes of  aerobic without signs/symptoms of physical distress  Progress to 30 minutes of  aerobic without signs/symptoms of physical distress  Progress to 30 minutes of  aerobic without signs/symptoms of physical distress    Intensity  THRR unchanged  THRR unchanged  THRR unchanged  THRR unchanged      Progression   Progression  Continue to progress workloads to maintain intensity without signs/symptoms of physical distress.  Continue to progress workloads to maintain intensity without signs/symptoms of physical distress.  Continue to progress workloads to maintain intensity  without signs/symptoms of physical distress.  Continue to progress workloads to maintain intensity without signs/symptoms of physical distress.    Average METs  2  2.3  2.4  2.3      Resistance Training   Training Prescription  Yes  No Relaxation day, no weights.  Yes  Yes    Weight  3lbs  -  4lbs  4lbs    Reps  10-15  -  10-15  10-15    Time  10 Minutes  -  10 Minutes  10 Minutes      Interval Training   Interval Training  No  No  No  No      Recumbant Bike   Level  -  -  -  -    Watts  -  -  -  -    Minutes  -  -  -  -    METs  -  -  -  -      NuStep   Level  2  3  3  3     SPM  85  85  85  85    Minutes  10  10  10  10     METs  1.7  2.1  2.1  2.5      Arm Ergometer   Level  1  2  2  2     Minutes  10  10  10  10     METs  1.7  1.9  1.8  1.8      Track   Laps  9  11  14  9     Minutes  10  10  10  10     METs  2.57  2.91  3.43  2.57      Home Exercise Plan   Plans to continue exercise at  -  Home (comment) Walking  Home (comment) Walking  Home (comment) Walking    Frequency  -  Add 4 additional days to program exercise sessions.  Add 4 additional  days to program exercise sessions.  Add 4 additional days to program exercise sessions.    Initial Home Exercises Provided  -  08/28/18  08/28/18  08/28/18       Exercise Comments: Exercise Comments    Row Name 08/23/18 1548 08/28/18 1540 09/02/18 1516       Exercise Comments  Patient tolerated low intensity exercise well. Some left knee discomfort on recumbent bike, so pt moved to arm ergometer instead.  Reviewed home exercise guidelines, METs, and goals with patient.  Reviewed METs and goals with patient.         Exercise Goals and Review: Exercise Goals    Row Name 08/13/18 1023             Exercise Goals   Increase Physical Activity  Yes       Intervention  Provide advice, education, support and counseling about physical activity/exercise needs.;Develop an individualized exercise prescription for aerobic and resistive training based on initial evaluation findings, risk stratification, comorbidities and participant's personal goals.       Expected Outcomes  Short Term: Attend rehab on a regular basis to increase amount of physical activity.;Long Term: Add in home exercise to make exercise part of routine and to increase amount of physical activity.;Long Term: Exercising regularly at least 3-5 days a week.       Increase Strength and Stamina  Yes       Intervention  Provide advice,  education, support and counseling about physical activity/exercise needs.;Develop an individualized exercise prescription for aerobic and resistive training based on initial evaluation findings, risk stratification, comorbidities and participant's personal goals.       Expected Outcomes  Short Term: Increase workloads from initial exercise prescription for resistance, speed, and METs.;Short Term: Perform resistance training exercises routinely during rehab and add in resistance training at home;Long Term: Improve cardiorespiratory fitness, muscular endurance and strength as measured by increased METs and  functional capacity (6MWT)       Able to understand and use rate of perceived exertion (RPE) scale  Yes       Intervention  Provide education and explanation on how to use RPE scale       Expected Outcomes  Short Term: Able to use RPE daily in rehab to express subjective intensity level;Long Term:  Able to use RPE to guide intensity level when exercising independently       Knowledge and understanding of Target Heart Rate Range (THRR)  Yes       Intervention  Provide education and explanation of THRR including how the numbers were predicted and where they are located for reference       Expected Outcomes  Short Term: Able to state/look up THRR;Long Term: Able to use THRR to govern intensity when exercising independently;Short Term: Able to use daily as guideline for intensity in rehab       Able to check pulse independently  Yes       Intervention  Provide education and demonstration on how to check pulse in carotid and radial arteries.;Review the importance of being able to check your own pulse for safety during independent exercise       Expected Outcomes  Short Term: Able to explain why pulse checking is important during independent exercise;Long Term: Able to check pulse independently and accurately       Understanding of Exercise Prescription  Yes       Intervention  Provide education, explanation, and written materials on patient's individual exercise prescription       Expected Outcomes  Short Term: Able to explain program exercise prescription;Long Term: Able to explain home exercise prescription to exercise independently          Exercise Goals Re-Evaluation : Exercise Goals Re-Evaluation    Row Name 08/23/18 1548 08/28/18 1540 09/02/18 1516 09/11/18 1527       Exercise Goal Re-Evaluation   Exercise Goals Review  Increase Physical Activity;Able to understand and use rate of perceived exertion (RPE) scale  Increase Physical Activity;Able to understand and use rate of perceived exertion  (RPE) scale;Knowledge and understanding of Target Heart Rate Range (THRR);Able to check pulse independently;Understanding of Exercise Prescription  Increase Physical Activity;Able to understand and use rate of perceived exertion (RPE) scale;Knowledge and understanding of Target Heart Rate Range (THRR);Able to check pulse independently;Understanding of Exercise Prescription;Increase Strength and Stamina  -    Comments  Patient able to understand and use RPE scale appropriately. Patient switched from recumbent bike to arm ergometer because of left knee discomfort.  Reviewed home exercise guidelines with patient including THRR, RPE scale, and endpoints for exercise. Pt is walking 20 minutes daily, weather permitting, as his mode of home exercise. Pt is agreeable to increasing exercise duration from 20 to 30 minutes by adding a minute each session until reaching goal. pt attended pulse coutning class and is able to check his pulse independently.  Patient has been checking pulse with walking at home and knows  his THRR. Pt feels that his knee is getting better and that his ability to move his knee has improved since he started exercise. Pt states that he's had some weight loss. Pt is stretching daily and walking ~20 minutes, 3 days/week.  Temporary department closure due to COVID-19.    Expected Outcomes  Progress workloads as tolerated to help improve cardiorespiratory fitness.  Patient will increase walking duration at home from 20 minutes daily to 30 minutes daily to meet recommended guidelines for exercise.  Continue to progreess workloads to help improve strength and stamina and get back to doing ADLs.  -       Discharge Exercise Prescription (Final Exercise Prescription Changes): Exercise Prescription Changes - 09/06/18 1458      Response to Exercise   Blood Pressure (Admit)  138/68    Blood Pressure (Exercise)  148/78    Blood Pressure (Exit)  120/60    Heart Rate (Admit)  85 bpm    Heart Rate  (Exercise)  103 bpm    Heart Rate (Exit)  85 bpm    Rating of Perceived Exertion (Exercise)  11    Symptoms  none    Duration  Progress to 30 minutes of  aerobic without signs/symptoms of physical distress    Intensity  THRR unchanged      Progression   Progression  Continue to progress workloads to maintain intensity without signs/symptoms of physical distress.    Average METs  2.3      Resistance Training   Training Prescription  Yes    Weight  4lbs    Reps  10-15    Time  10 Minutes      Interval Training   Interval Training  No      NuStep   Level  3    SPM  85    Minutes  10    METs  2.5      Arm Ergometer   Level  2    Minutes  10    METs  1.8      Track   Laps  9    Minutes  10    METs  2.57      Home Exercise Plan   Plans to continue exercise at  Home (comment)   Walking   Frequency  Add 4 additional days to program exercise sessions.    Initial Home Exercises Provided  08/28/18       Nutrition:  Target Goals: Understanding of nutrition guidelines, daily intake of sodium 1500mg , cholesterol 200mg , calories 30% from fat and 7% or less from saturated fats, daily to have 5 or more servings of fruits and vegetables.  Biometrics: Pre Biometrics - 08/13/18 0803      Pre Biometrics   Height  5' 7.5" (1.715 m)    Weight  230 lb 9.6 oz (104.6 kg)    Waist Circumference  48 inches    Hip Circumference  45.5 inches    Waist to Hip Ratio  1.05 %    BMI (Calculated)  35.56    Triceps Skinfold  30 mm    % Body Fat  37.5 %    Grip Strength  26 kg    Flexibility  0 in    Single Leg Stand  13.06 seconds        Nutrition Therapy Plan and Nutrition Goals: Nutrition Therapy & Goals - 08/13/18 1538      Nutrition Therapy   Diet  heart healthy, carb modified  Personal Nutrition Goals   Nutrition Goal  Pt to identify and limit food sources of saturated fat, trans fat, refined carbohydrates and sodium    Personal Goal #2  Pt to identify food quantities  necessary to achieve weight loss of 6-24 lb at graduation from cardiac rehab.     Personal Goal #3  Pt to build a healthy plate including vegetables, fruits, whole grains, and low-fat dairy products in a heart healthy meal plan.    Personal Goal #4  Pt to weigh and measure serving sizes      Intervention Plan   Intervention  Prescribe, educate and counsel regarding individualized specific dietary modifications aiming towards targeted core components such as weight, hypertension, lipid management, diabetes, heart failure and other comorbidities.    Expected Outcomes  Short Term Goal: Understand basic principles of dietary content, such as calories, fat, sodium, cholesterol and nutrients.;Long Term Goal: Adherence to prescribed nutrition plan.       Nutrition Assessments:   Nutrition Goals Re-Evaluation: Nutrition Goals Re-Evaluation    Row Name 08/13/18 1538             Goals   Current Weight  230 lb 9.6 oz (104.6 kg)          Nutrition Goals Re-Evaluation: Nutrition Goals Re-Evaluation    Marvin Frost Name 08/13/18 1538             Goals   Current Weight  230 lb 9.6 oz (104.6 kg)          Nutrition Goals Discharge (Final Nutrition Goals Re-Evaluation): Nutrition Goals Re-Evaluation - 08/13/18 1538      Goals   Current Weight  230 lb 9.6 oz (104.6 kg)       Psychosocial: Target Goals: Acknowledge presence or absence of significant depression and/or stress, maximize coping skills, provide positive support system. Participant is able to verbalize types and ability to use techniques and skills needed for reducing stress and depression.  Initial Review & Psychosocial Screening: Initial Psych Review & Screening - 08/13/18 1114      Initial Review   Current issues with  None Identified      Family Dynamics   Good Support System?  Yes   Marvin Frost has his wife, pastor and friends for support     Barriers   Psychosocial barriers to participate in program  There are no  identifiable barriers or psychosocial needs.      Screening Interventions   Interventions  Encouraged to exercise       Quality of Life Scores: Quality of Life - 08/13/18 1028      Quality of Life   Select  Quality of Life      Quality of Life Scores   Health/Function Pre  25.53 %    Socioeconomic Pre  27.5 %    Psych/Spiritual Pre  29.14 %    Family Pre  30 %    GLOBAL Pre  27.34 %      Scores of 19 and below usually indicate a poorer quality of life in these areas.  A difference of  2-3 points is a clinically meaningful difference.  A difference of 2-3 points in the total score of the Quality of Life Index has been associated with significant improvement in overall quality of life, self-image, physical symptoms, and general health in studies assessing change in quality of life.  PHQ-9: Recent Review Flowsheet Data    Depression screen [facility] 2/9 08/23/2018 07/23/2018 07/01/2018 03/27/2018 02/20/2018   Decreased  Interest 0 0 0 0 0   Down, Depressed, Hopeless 0 0 0 0 0   PHQ - 2 Score 0 0 0 0 0     Interpretation of Total Score  Total Score Depression Severity:  1-4 = Minimal depression, 5-9 = Mild depression, 10-14 = Moderate depression, 15-19 = Moderately severe depression, 20-27 = Severe depression   Psychosocial Evaluation and Intervention:   Psychosocial Re-Evaluation: Psychosocial Re-Evaluation    Marvin Frost Name 09/13/18 1453             Psychosocial Re-Evaluation   Current issues with  None Identified       Comments  Unable to reassess as cardiac rehab is currently on hold          Psychosocial Discharge (Final Psychosocial Re-Evaluation): Psychosocial Re-Evaluation - 09/13/18 1453      Psychosocial Re-Evaluation   Current issues with  None Identified    Comments  Unable to reassess as cardiac rehab is currently on hold       Vocational Rehabilitation: Provide vocational rehab assistance to qualifying candidates.   Vocational Rehab Evaluation &  Intervention: Vocational Rehab - 08/13/18 1115      Initial Vocational Rehab Evaluation & Intervention   Assessment shows need for Vocational Rehabilitation  No   Marvin Frost is retired and does not need vocational rehab at this time      Education: Education Goals: Education classes will be provided on a weekly basis, covering required topics. Participant will state understanding/return demonstration of topics presented.  Learning Barriers/Preferences: Learning Barriers/Preferences - 08/13/18 1047      Learning Barriers/Preferences   Learning Barriers  None    Learning Preferences  Written Material;Video;Skilled Demonstration;Pictoral       Education Topics: Count Your Pulse:  -Group instruction provided by verbal instruction, demonstration, patient participation and written materials to support subject.  Instructors address importance of being able to find your pulse and how to count your pulse when at home without a heart monitor.  Patients get hands on experience counting their pulse with staff help and individually.   Heart Attack, Angina, and Risk Factor Modification:  -Group instruction provided by verbal instruction, video, and written materials to support subject.  Instructors address signs and symptoms of angina and heart attacks.    Also discuss risk factors for heart disease and how to make changes to improve heart health risk factors.   Functional Fitness:  -Group instruction provided by verbal instruction, demonstration, patient participation, and written materials to support subject.  Instructors address safety measures for doing things around the house.  Discuss how to get up and down off the floor, how to pick things up properly, how to safely get out of a chair without assistance, and balance training.   Meditation and Mindfulness:  -Group instruction provided by verbal instruction, patient participation, and written materials to support subject.  Instructor addresses  importance of mindfulness and meditation practice to help reduce stress and improve awareness.  Instructor also leads participants through a meditation exercise.    CARDIAC REHAB PHASE II EXERCISE from 09/06/2018 in [place]  Date  08/28/18  Educator  Mable Fill  Instruction Review Code  2- Demonstrated Understanding      Stretching for Flexibility and Mobility:  -Group instruction provided by verbal instruction, patient participation, and written materials to support subject.  Instructors lead participants through series of stretches that are designed to increase flexibility thus improving mobility.  These stretches are additional exercise for  major muscle groups that are typically performed during regular warm up and cool down.   Hands Only CPR:  -Group verbal, video, and participation provides a basic overview of AHA guidelines for community CPR. Role-play of emergencies allow participants the opportunity to practice calling for help and chest compression technique with discussion of AED use.   Hypertension: -Group verbal and written instruction that provides a basic overview of hypertension including the most recent diagnostic guidelines, risk factor reduction with self-care instructions and medication management.   CARDIAC REHAB PHASE II EXERCISE from 09/06/2018 in [place]  Date  09/06/18  Instruction Review Code  2- Demonstrated Understanding       Nutrition I class: Heart Healthy Eating:  -Group instruction provided by PowerPoint slides, verbal discussion, and written materials to support subject matter. The instructor gives an explanation and review of the Therapeutic Lifestyle Changes diet recommendations, which includes a discussion on lipid goals, dietary fat, sodium, fiber, plant stanol/sterol esters, sugar, and the components of a well-balanced, healthy diet.   Nutrition II class: Lifestyle Skills:  -Group  instruction provided by PowerPoint slides, verbal discussion, and written materials to support subject matter. The instructor gives an explanation and review of label reading, grocery shopping for heart health, heart healthy recipe modifications, and ways to make healthier choices when eating out.   Diabetes Question & Answer:  -Group instruction provided by PowerPoint slides, verbal discussion, and written materials to support subject matter. The instructor gives an explanation and review of diabetes co-morbidities, pre- and post-prandial blood glucose goals, pre-exercise blood glucose goals, signs, symptoms, and treatment of hypoglycemia and hyperglycemia, and foot care basics.   Diabetes Blitz:  -Group instruction provided by PowerPoint slides, verbal discussion, and written materials to support subject matter. The instructor gives an explanation and review of the physiology behind type 1 and type 2 diabetes, diabetes medications and rational behind using different medications, pre- and post-prandial blood glucose recommendations and Hemoglobin A1c goals, diabetes diet, and exercise including blood glucose guidelines for exercising safely.    Portion Distortion:  -Group instruction provided by PowerPoint slides, verbal discussion, written materials, and food models to support subject matter. The instructor gives an explanation of serving size versus portion size, changes in portions sizes over the last 20 years, and what consists of a serving from each food group.   Stress Management:  -Group instruction provided by verbal instruction, video, and written materials to support subject matter.  Instructors review role of stress in heart disease and how to cope with stress positively.     Exercising on Your Own:  -Group instruction provided by verbal instruction, power point, and written materials to support subject.  Instructors discuss benefits of exercise, components of exercise, frequency and  intensity of exercise, and end points for exercise.  Also discuss use of nitroglycerin and activating EMS.  Review options of places to exercise outside of rehab.  Review guidelines for sex with heart disease.   CARDIAC REHAB PHASE II EXERCISE from 09/06/2018 in [place]  Date  08/21/18  Instruction Review Code  2- Demonstrated Understanding      Cardiac Drugs I:  -Group instruction provided by verbal instruction and written materials to support subject.  Instructor reviews cardiac drug classes: antiplatelets, anticoagulants, beta blockers, and statins.  Instructor discusses reasons, side effects, and lifestyle considerations for each drug class.   Cardiac Drugs II:  -Group instruction provided by verbal instruction and written materials to support  subject.  Instructor reviews cardiac drug classes: angiotensin converting enzyme inhibitors (ACE-I), angiotensin II receptor blockers (ARBs), nitrates, and calcium channel blockers.  Instructor discusses reasons, side effects, and lifestyle considerations for each drug class.   Anatomy and Physiology of the Circulatory System:  Group verbal and written instruction and models provide basic cardiac anatomy and physiology, with the coronary electrical and arterial systems. Review of: AMI, Angina, Valve disease, Heart Failure, Peripheral Artery Disease, Cardiac Arrhythmia, Pacemakers, and the ICD.   Other Education:  -Group or individual verbal, written, or video instructions that support the educational goals of the cardiac rehab program.   Holiday Eating Survival Tips:  -Group instruction provided by PowerPoint slides, verbal discussion, and written materials to support subject matter. The instructor gives patients tips, tricks, and techniques to help them not only survive but enjoy the holidays despite the onslaught of food that accompanies the holidays.   Knowledge Questionnaire Score: Knowledge Questionnaire  Score - 08/13/18 1026      Knowledge Questionnaire Score   Pre Score  20/24       Core Components/Risk Factors/Patient Goals at Admission: Personal Goals and Risk Factors at Admission - 08/13/18 1035      Core Components/Risk Factors/Patient Goals on Admission    Weight Management  Yes;Obesity    Intervention  Weight Management: Develop a combined nutrition and exercise program designed to reach desired caloric intake, while maintaining appropriate intake of nutrient and fiber, sodium and fats, and appropriate energy expenditure required for the weight goal.;Weight Management: Provide education and appropriate resources to help participant work on and attain dietary goals.;Weight Management/Obesity: Establish reasonable short term and long term weight goals.;Obesity: Provide education and appropriate resources to help participant work on and attain dietary goals.    Admit Weight  230 lb 9.6 oz (104.6 kg)    Expected Outcomes  Weight Loss: Understanding of general recommendations for a balanced deficit meal plan, which promotes 1-2 lb weight loss per week and includes a negative energy balance of [phone] kcal/d;Short Term: Continue to assess and modify interventions until short term weight is achieved;Long Term: Adherence to nutrition and physical activity/exercise program aimed toward attainment of established weight goal;Understanding recommendations for meals to include 15-35% energy as protein, 25-35% energy from fat, 35-60% energy from carbohydrates, less than 200mg  of dietary cholesterol, 20-35 gm of total fiber daily;Understanding of distribution of calorie intake throughout the day with the consumption of 4-5 meals/snacks    Diabetes  Yes    Intervention  Provide education about signs/symptoms and action to take for hypo/hyperglycemia.;Provide education about proper nutrition, including hydration, and aerobic/resistive exercise prescription along with prescribed medications to achieve blood  glucose in normal ranges: Fasting glucose 65-99 mg/dL    Expected Outcomes  Short Term: Participant verbalizes understanding of the signs/symptoms and immediate care of hyper/hypoglycemia, proper foot care and importance of medication, aerobic/resistive exercise and nutrition plan for blood glucose control.;Long Term: Attainment of HbA1C < 7%.    Hypertension  Yes    Intervention  Provide education on lifestyle modifcations including regular physical activity/exercise, weight management, moderate sodium restriction and increased consumption of fresh fruit, vegetables, and low fat dairy, alcohol moderation, and smoking cessation.;Monitor prescription use compliance.    Expected Outcomes  Short Term: Continued assessment and intervention until BP is < 140/53mm HG in hypertensive participants. < 130/66mm HG in hypertensive participants with diabetes, heart failure or chronic kidney disease.;Long Term: Maintenance of blood pressure at goal levels.    Lipids  Yes  Intervention  Provide education and support for participant on nutrition & aerobic/resistive exercise along with prescribed medications to achieve LDL 70mg , HDL >40mg .    Expected Outcomes  Short Term: Participant states understanding of desired cholesterol values and is compliant with medications prescribed. Participant is following exercise prescription and nutrition guidelines.;Long Term: Cholesterol controlled with medications as prescribed, with individualized exercise RX and with personalized nutrition plan. Value goals: LDL < 70mg , HDL > 40 mg.       Core Components/Risk Factors/Patient Goals Review:  Goals and Risk Factor Review    Row Name 09/13/18 1454             Core Components/Risk Factors/Patient Goals Review   Personal Goals Review  Weight Management/Obesity;Hypertension;Lipids;Diabetes       Review  Exercise is currently on hold as the department is closed per reccomended guidelines from the [employer] to prevent  the spread of COVID-19       Expected Outcomes  Marvin Frost will continue to participate in phase 2 cardiac rehab for life style modification and risk factor reduction when exercise resumes          Core Components/Risk Factors/Patient Goals at Discharge (Final Review):  Goals and Risk Factor Review - 09/13/18 1454      Core Components/Risk Factors/Patient Goals Review   Personal Goals Review  Weight Management/Obesity;Hypertension;Lipids;Diabetes    Review  Exercise is currently on hold as the department is closed per reccomended guidelines from the [employer] to prevent the spread of COVID-19    Expected Outcomes  Marvin Frost will continue to participate in phase 2 cardiac rehab for life style modification and risk factor reduction when exercise resumes       ITP Comments: ITP Comments    Row Name 08/13/18 0807 08/23/18 1459 09/13/18 1453       ITP Comments  Medical Director- Dr. Fransico Him, MD  30 Day ITP Review. Today was [place] first day of exercise  30 Day ITP Review.Exercise is currently on hold as the department is closed per reccomended guidelines from the [employer] to prevent the spread of COVID-19        Comments: See ITP comments.Barnet Pall, RN,BSN 09/18/2018 10:15 AM

## 2018-09-20 ENCOUNTER — Ambulatory Visit (HOSPITAL_COMMUNITY): Payer: Medicare Other

## 2018-09-20 ENCOUNTER — Encounter (HOSPITAL_COMMUNITY): Payer: Medicare Other

## 2018-09-23 ENCOUNTER — Encounter (HOSPITAL_COMMUNITY): Payer: Medicare Other

## 2018-09-23 ENCOUNTER — Ambulatory Visit (HOSPITAL_COMMUNITY): Payer: Medicare Other

## 2018-09-24 ENCOUNTER — Ambulatory Visit: Payer: Medicare Other | Admitting: Cardiology

## 2018-09-25 ENCOUNTER — Telehealth: Payer: Self-pay

## 2018-09-25 ENCOUNTER — Encounter (HOSPITAL_COMMUNITY): Payer: Medicare Other

## 2018-09-25 ENCOUNTER — Ambulatory Visit (HOSPITAL_COMMUNITY): Payer: Medicare Other

## 2018-09-25 NOTE — Telephone Encounter (Signed)
Virtual Visit Pre-Appointment Phone Call  Steps For Call:  1. Confirm consent - "In the setting of the current Covid19 crisis, you are scheduled for a (phone or video) visit with your provider on (date) at (time).  Just as we do with many in-office visits, in order for you to participate in this visit, we must obtain consent.  If you'd like, I can send this to your mychart (if signed up) or email for you to review.  Otherwise, I can obtain your verbal consent now.  All virtual visits are billed to your insurance company just like a normal visit would be.  By agreeing to a virtual visit, we'd like you to understand that the technology does not allow for your provider to perform an examination, and thus may limit your provider's ability to fully assess your condition.  Finally, though the technology is pretty good, we cannot assure that it will always work on either your or our end, and in the setting of a video visit, we may have to convert it to a phone-only visit.  In either situation, we cannot ensure that we have a secure connection.  Are you willing to proceed?"  2. Give patient instructions for WebEx download to smartphone as below if video visit  3. Advise patient to be prepared with any vital sign or heart rhythm information, their current medicines, and a piece of paper and pen handy for any instructions they may receive the day of their visit  4. Inform patient they will receive a phone call 15 minutes prior to their appointment time (may be from unknown caller ID) so they should be prepared to answer  5. Confirm that appointment type is correct in Epic appointment notes (video vs telephone)    TELEPHONE CALL NOTE  Marvin Frost has been deemed a candidate for a follow-up tele-health visit to limit community exposure during the Covid-19 pandemic. I spoke with the patient via phone to ensure availability of phone/video source, confirm preferred email & phone number, and discuss  instructions and expectations.  I reminded Marvin Frost to be prepared with any vital sign and/or heart rhythm information that could potentially be obtained via home monitoring, at the time of his visit. I reminded Marvin Frost to expect a phone call at the time of his visit if his visit.  Did the patient verbally acknowledge consent to treatment? Patient provided verbal consent.  Marvin Frost, [place] 09/25/2018 5:32 PM   DOWNLOADING THE [place], go to [employer] and type in WebEx in the search bar. [place] [employer], the blue/green circle. The app is free but as with any other app downloads, their phone may require them to verify saved payment information or Apple password. The patient does NOT have to create an account.  - If Android, ask patient to go to [employer] and type in WebEx in the search bar. [place] [employer], the blue/green circle. The app is free but as with any other app downloads, their phone may require them to verify saved payment information or Android password. The patient does NOT have to create an account.   CONSENT FOR TELE-HEALTH VISIT - PLEASE REVIEW  I hereby voluntarily request, consent and authorize [facility] and its employed or contracted physicians, physician assistants, nurse practitioners or other licensed health care professionals (the Practitioner), to provide me with telemedicine health care services (the "Services") as deemed necessary by the treating Practitioner.  I acknowledge and consent to receive the Services by the Practitioner via telemedicine. I understand that the telemedicine visit will involve communicating with the Practitioner through live audiovisual communication technology and the disclosure of certain medical information by electronic transmission. I acknowledge that I have been given the opportunity to request an in-person assessment or other available alternative prior to the  telemedicine visit and am voluntarily participating in the telemedicine visit.  I understand that I have the right to withhold or withdraw my consent to the use of telemedicine in the course of my care at any time, without affecting my right to future care or treatment, and that the Practitioner or I may terminate the telemedicine visit at any time. I understand that I have the right to inspect all information obtained and/or recorded in the course of the telemedicine visit and may receive copies of available information for a reasonable fee.  I understand that some of the potential risks of receiving the Services via telemedicine include:  Marland Kitchen Delay or interruption in medical evaluation due to technological equipment failure or disruption; . Information transmitted may not be sufficient (e.g. poor resolution of images) to allow for appropriate medical decision making by the Practitioner; and/or  . In rare instances, security protocols could fail, causing a breach of personal health information.  Furthermore, I acknowledge that it is my responsibility to provide information about my medical history, conditions and care that is complete and accurate to the best of my ability. I acknowledge that Practitioner's advice, recommendations, and/or decision may be based on factors not within their control, such as incomplete or inaccurate data provided by me or distortions of diagnostic images or specimens that may result from electronic transmissions. I understand that the practice of medicine is not an exact science and that Practitioner makes no warranties or guarantees regarding treatment outcomes. I acknowledge that I will receive a copy of this consent concurrently upon execution via email to the email address I last provided but may also request a printed copy by calling the office of [place].    I understand that my insurance will be billed for this visit.   I have read or had this consent read to me.  . I understand the contents of this consent, which adequately explains the benefits and risks of the Services being provided via telemedicine.  . I have been provided ample opportunity to ask questions regarding this consent and the Services and have had my questions answered to my satisfaction. . I give my informed consent for the services to be provided through the use of telemedicine in my medical care  By participating in this telemedicine visit I agree to the above.

## 2018-09-26 ENCOUNTER — Encounter: Payer: Self-pay | Admitting: Cardiology

## 2018-09-26 ENCOUNTER — Other Ambulatory Visit: Payer: Self-pay

## 2018-09-27 ENCOUNTER — Ambulatory Visit (HOSPITAL_COMMUNITY): Payer: Medicare Other

## 2018-09-27 ENCOUNTER — Other Ambulatory Visit: Payer: Self-pay | Admitting: *Deleted

## 2018-09-27 ENCOUNTER — Other Ambulatory Visit: Payer: Self-pay | Admitting: Pharmacist

## 2018-09-27 ENCOUNTER — Encounter: Payer: Self-pay | Admitting: Cardiology

## 2018-09-27 ENCOUNTER — Encounter (HOSPITAL_COMMUNITY): Payer: Medicare Other

## 2018-09-27 ENCOUNTER — Telehealth (INDEPENDENT_AMBULATORY_CARE_PROVIDER_SITE_OTHER): Payer: Medicare Other | Admitting: Cardiology

## 2018-09-27 VITALS — BP 124/76 | HR 79 | Wt 223.0 lb

## 2018-09-27 DIAGNOSIS — E1169 Type 2 diabetes mellitus with other specified complication: Secondary | ICD-10-CM

## 2018-09-27 DIAGNOSIS — Z7189 Other specified counseling: Secondary | ICD-10-CM | POA: Diagnosis not present

## 2018-09-27 DIAGNOSIS — Z8679 Personal history of other diseases of the circulatory system: Secondary | ICD-10-CM | POA: Insufficient documentation

## 2018-09-27 DIAGNOSIS — I10 Essential (primary) hypertension: Secondary | ICD-10-CM

## 2018-09-27 DIAGNOSIS — E782 Mixed hyperlipidemia: Secondary | ICD-10-CM

## 2018-09-27 MED ORDER — OLMESARTAN MEDOXOMIL-HCTZ 40-25 MG PO TABS: 1.0000 | ORAL_TABLET | Freq: Every day | ORAL | 3 refills | Status: DC

## 2018-09-27 MED ORDER — PANTOPRAZOLE SODIUM 40 MG PO TBEC: 40.0000 mg | DELAYED_RELEASE_TABLET | Freq: Two times a day (BID) | ORAL | 2 refills | Status: DC

## 2018-09-27 MED ORDER — ROSUVASTATIN CALCIUM 20 MG PO TABS: 20.0000 mg | ORAL_TABLET | Freq: Every day | ORAL | 3 refills | Status: DC

## 2018-09-27 MED ORDER — EMPAGLIFLOZIN-METFORMIN HCL 12.5-1000 MG PO TABS: 1.0000 | ORAL_TABLET | Freq: Two times a day (BID) | ORAL | 3 refills | Status: DC

## 2018-09-27 MED ORDER — CLOPIDOGREL BISULFATE 75 MG PO TABS: 75.0000 mg | ORAL_TABLET | Freq: Every day | ORAL | 3 refills | Status: DC

## 2018-09-27 MED ORDER — CARVEDILOL 3.125 MG PO TABS: 3.1250 mg | ORAL_TABLET | Freq: Two times a day (BID) | ORAL | 3 refills | Status: DC

## 2018-09-27 MED ORDER — INSULIN ASPART 100 UNIT/ML FLEXPEN: PEN_INJECTOR | SUBCUTANEOUS | 3 refills | Status: DC

## 2018-09-27 MED ORDER — INSULIN DEGLUDEC 200 UNIT/ML ~~LOC~~ SOPN: 60.0000 [IU] | PEN_INJECTOR | Freq: Every day | SUBCUTANEOUS | 3 refills | Status: DC

## 2018-09-27 NOTE — Progress Notes (Signed)
Virtual Visit via Video Note    Evaluation Performed:  Follow-up visit  This visit type was conducted due to national recommendations for restrictions regarding the COVID-19 Pandemic (e.g. social distancing).  This format is felt to be most appropriate for this patient at this time.  All issues noted in this document were discussed and addressed.  No physical exam was performed (except for noted visual exam findings with Video Visits).  Please refer to the patient's chart (MyChart message for video visits and phone note for telephone visits) for the patient's consent to telehealth for [facility].  Date:  09/27/2018   ID:  Marvin Frost, DOB 03-12-1946, MRN [id number]  Patient Location:  Home address  Provider location:   [facility] office  PCP:  Chipper Herb, MD  Cardiologist:  Minus Breeding, MD  Electrophysiologist:  None   Chief Complaint:  CAD  History of Present Illness:    Marvin Frost is a 73 y.o. male who presents via audio/video conferencing for a telehealth visit today.    The patient has been doing well.  He is a little disappointed because he was doing cardiac rehab and doing so well with this.  He still trying to walk to the long driveway that has about a 35% grade.  He is not getting any chest discomfort. The patient denies any new symptoms such as chest discomfort, neck or arm discomfort. There has been no new shortness of breath, PND or orthopnea. There have been no reported palpitations, presyncope or syncope.    The patient does not have symptoms concerning for COVID-19 infection (fever, chills, cough, or new shortness of breath).    Prior CV studies:   The following studies were reviewed today:  None  Past Medical History:  Diagnosis Date  . Allergy   . Arthritis   . CAD (coronary artery disease)   . 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 Cataract   . Diabetes mellitus   . Dysrhythmia    history of heart palpitations   .  Gastric polyps 09/18/2017  . GERD (gastroesophageal reflux disease)   . Heart attack ([place])   . History of frequent urinary tract infections   . History of hiatal hernia   . Hyperlipemia   . Hypertension   . Obesity   . Pancreatitis    Past Surgical History:  Procedure Laterality Date  . CARDIAC CATHETERIZATION     2004 and 1999- done by Dr Percival Spanish   . COLONOSCOPY  11/2003   diverticulosis, no polyps  . CORONARY STENT INTERVENTION N/A 05/28/2018   Procedure: CORONARY STENT INTERVENTION;  Surgeon: Sherren Mocha, MD;  Location: [place] [facility];  Service: Cardiovascular;  Laterality: N/A;  . EYE SURGERY Bilateral    cateracts - dr Katy Fitch  . HYDROCELE EXCISION / REPAIR    . INTRAVASCULAR PRESSURE WIRE/FFR STUDY N/A 05/28/2018   Procedure: INTRAVASCULAR PRESSURE WIRE/FFR STUDY;  Surgeon: Sherren Mocha, MD;  Location: [place] [facility];  Service: Cardiovascular;  Laterality: N/A;  . left calf surgery      related to trauma of being torn   . LEFT HEART CATH AND CORONARY ANGIOGRAPHY N/A 05/28/2018   Procedure: LEFT HEART CATH AND CORONARY ANGIOGRAPHY;  Surgeon: Sherren Mocha, MD;  Location: [place] [facility];  Service: Cardiovascular;  Laterality: N/A;  . left index finger arthrioscopic surgery     . lymph node removed from right neck     .  right shoulder surgery    . TOTAL HIP ARTHROPLASTY Right 03/16/2015   Procedure: RIGHT TOTAL HIP ARTHROPLASTY ANTERIOR APPROACH;  Surgeon: Paralee Cancel, MD;  Location: [facility];  Service: Orthopedics;  Laterality: Right;  . TOTAL HIP ARTHROPLASTY Left 06/22/2015   Procedure: LEFT TOTAL HIP ARTHROPLASTY ANTERIOR APPROACH;  Surgeon: Paralee Cancel, MD;  Location: [facility];  Service: Orthopedics;  Laterality: Left;  . UPPER GASTROINTESTINAL ENDOSCOPY     hiatal hernia 2cm, mild gastritis - 2012, multiple hyperplastic and fundic gland gastric polyps 2019     No outpatient medications have been marked as taking for the 09/27/18 encounter (Telemedicine) with  Minus Breeding, MD.     Allergies:   Brilinta [ticagrelor]; Liraglutide; Baxinets [cetylpyridinium-benzocaine]; Celebrex [celecoxib]; Toprol xl [metoprolol succinate]; Triplex ad; Welchol [colesevelam hcl]; Norvasc [amlodipine besylate]; and Statins   Prior to Admission medications   Medication Sig Start Date End Date Taking? Authorizing Provider  amoxicillin (AMOXIL) 500 MG capsule Take 4 capsules (2,000 mg total) by mouth See admin instructions. Take four capsules (2000 mg) by mouth one hour prior to dental appointments Patient not taking: Reported on 08/20/2018 07/10/18   Chipper Herb, MD  aspirin EC 81 MG EC tablet Take 1 tablet (81 mg total) by mouth daily. 05/30/18   Tommie Raymond, NP  BENICAR HCT 40-25 MG tablet TAKE 1 TABLET DAILY 09/17/18   Chipper Herb, MD  Blood Glucose Monitoring Suppl (FREESTYLE LITE) DEVI Use to check BS bid. DX. E11.9 07/29/14   Chipper Herb, MD  carvedilol (COREG) 3.125 MG tablet Take 1 tablet (3.125 mg total) by mouth 2 (two) times daily. 08/30/18 08/25/19  Minus Breeding, MD  cetirizine (ZYRTEC) 10 MG tablet Take 10 mg by mouth as needed for allergies or rhinitis.     [provider]  clopidogrel (PLAVIX) 75 MG tablet Take 75 mg by mouth daily.    [provider]  Empagliflozin-metFORMIN HCl (SYNJARDY) 12.10-998 MG TABS Take 1 tablet by mouth 2 (two) times daily. 10/15/17   Chipper Herb, MD  ferrous sulfate 325 (65 FE) MG tablet Take 1 tablet (325 mg total) by mouth every other day. Patient taking differently: Take 325 mg by mouth every morning.  07/22/18   Chipper Herb, MD  glucose blood (FREESTYLE LITE) test strip USE TO CHECK BLOOD SUGAR TWICE A DAY AND AS NEEDED 08/04/16   Chipper Herb, MD  insulin aspart (NOVOLOG FLEXPEN) 100 UNIT/ML FlexPen INJECT 16 TO 20 UNITS UNDER THE SKIN TWICE A DAY Patient taking differently: Inject 10-22 Units into the skin See admin instructions. Inject 22 units subcutaneously before breakfast and after  supper, inject 10-15 units at bedtime per sliding scale: CBG <200 10 - 12 units, >200 15 units 10/15/17   Chipper Herb, MD  Insulin Degludec (TRESIBA FLEXTOUCH) 200 UNIT/ML SOPN Inject 60-70 Units into the skin daily. Patient taking differently: Inject 72 Units into the skin at bedtime.  10/15/17   Chipper Herb, MD  Insulin Pen Needle (NOVOTWIST) 32G X 5 MM MISC USE WITH INSULIN PENS UP TO 5 TIMES DAILY 01/16/18   Chipper Herb, MD  Lancets (FREESTYLE) lancets Check BS BID and PRN. DX.E11.9 03/13/18   Memory Argue, PharmD  nitroGLYCERIN (NITROSTAT) 0.4 MG SL tablet Place 1 tablet (0.4 mg total) under the tongue every 5 (five) minutes as needed for chest pain. 08/30/18 11/28/18  Minus Breeding, MD  pantoprazole (PROTONIX) 40 MG tablet Take 1 tablet (40 mg total) by mouth  2 (two) times daily after a meal. 07/26/18   Chipper Herb, MD  REPATHA SURECLICK 235 MG/ML SOAJ INJECT 140 MG UNDER THE SKIN EVERY 14 DAYS 08/12/18   Chipper Herb, MD  rosuvastatin (CRESTOR) 20 MG tablet Take 1 tablet (20 mg total) by mouth daily. Patient taking differently: Take 20 mg by mouth every other day. Taking every 3 days 05/29/18 05/29/19  Tommie Raymond, NP     Social History   Tobacco Use  . Smoking status: Former Smoker    Packs/day: 1.00    Years: 10.00    Pack years: 10.00    Types: Cigarettes    Last attempt to quit: 04/06/1984    Years since quitting: 34.4  . Smokeless tobacco: Former Systems developer    Types: [place] date: 06/26/1978  Substance Use Topics  . Alcohol use: No  . Drug use: No     Family Hx: The patient's family history includes Diabetes in his brother; GI problems in his sister; Healthy in his daughter; Heart attack in his mother; Heart attack (age of onset: 47) in his father; Heart attack (age of onset: 28) in his brother; Heart disease in his brother and mother. There is no history of Colon polyps, Esophageal cancer, Rectal cancer, or Stomach cancer.  ROS:   Please see the  history of present illness.    As stated in the HPI and negative for all other systems.   Labs/Other Tests and Data Reviewed:    Recent Labs: 05/27/2018: B Natriuretic Peptide 9.6 07/09/2018: BUN 10; Creatinine, Ser 1.07; Potassium 4.2; Sodium 136 08/19/2018: ALT 26 08/20/2018: Hemoglobin 11.4; Platelets 351.0   Recent Lipid Panel Lab Results  Component Value Date/Time   CHOL 112 08/19/2018 09:55 AM   CHOL 157 12/31/2012 08:57 AM   TRIG 201 (H) 08/19/2018 09:55 AM   TRIG 271 (H) 02/17/2016 08:24 AM   TRIG 184 (H) 12/31/2012 08:57 AM   HDL 46 08/19/2018 09:55 AM   HDL 44 02/17/2016 08:24 AM   HDL 45 12/31/2012 08:57 AM   CHOLHDL 2.4 08/19/2018 09:55 AM   LDLCALC 26 08/19/2018 09:55 AM   LDLCALC 52 01/19/2014 10:07 AM   LDLCALC 75 12/31/2012 08:57 AM   LDLDIRECT 39 11/02/2016 08:06 AM    Wt Readings from Last 3 Encounters:  09/27/18 223 lb (101.2 kg)  08/20/18 228 lb 8 oz (103.6 kg)  08/13/18 230 lb 9.6 oz (104.6 kg)     Objective:    Vital Signs:  Wt 223 lb (101.2 kg)   BMI 34.93 kg/m   GENERAL:  Well appearing  No distress.       ASSESSMENT & PLAN:     CAD-  The patient has no new sypmtoms.  No further cardiovascular testing is indicated.  We will continue with aggressive risk reduction and meds as listed.  HYPERLIPIDEMIA -  LDL was excellent at 26.  HDL 46.  No change in therapy.  HYPERTENSION -  The blood pressure is at target. No change in medications is indicated. We will continue with therapeutic lifestyle changes (TLC).  DM -  A1C was 7.1.  No change in therapy.   COVID-19 Education: The signs and symptoms of COVID-19 were discussed with the patient and how to seek care for testing (follow up with PCP or arrange E-visit).  The importance of social distancing was discussed today.  Patient Risk:   After full review of this patient's clinical status, I feel that they are  at least moderate risk at this time.  Time:   Today, I have spent 16 minutes  with the patient with telehealth technology discussing The above.     Medication Adjustments/Labs and Tests Ordered: Current medicines are reviewed at length with the patient today.  Concerns regarding medicines are outlined above.  Tests Ordered: No orders of the defined types were placed in this encounter.  Medication Changes: No orders of the defined types were placed in this encounter.   Disposition:  Follow up in 6 month(s)  Signed, Minus Breeding, MD  09/27/2018 2:25 PM    [facility]

## 2018-09-27 NOTE — Patient Instructions (Signed)
Medication Instructions:  Continue current medications  If you need a refill on your cardiac medications before your next appointment, please call your pharmacy.  Labwork: None Ordered HERE IN OUR OFFICE AT [facility]  You will need to fast. DO NOT EAT OR DRINK PAST MIDNIGHT.     You will NOT need to fast   Take the provided lab slips with you to the lab for your blood draw.   When you have your labs (blood work) drawn today and your tests are completely normal, you will receive your results only by MyChart Message (if you have MyChart) -OR-  A paper copy in the mail.  If you have any lab test that is abnormal or we need to change your treatment, we will call you to review these results.  Testing/Procedures: None Ordered  Follow-Up: You will need a follow up appointment in 6 months.  Please call our office 2 months in advance to schedule this appointment.  You may see Minus Breeding, MD or one of the following Advanced Practice Providers on your designated Care Team:   Rosaria Ferries, PA-C . Jory Sims, DNP, ANP    At [facility], you and your health needs are our priority.  As part of our continuing mission to provide you with exceptional heart care, we have created designated Provider Care Teams.  These Care Teams include your primary Cardiologist (physician) and Advanced Practice Providers (APPs -  Physician Assistants and Nurse Practitioners) who all work together to provide you with the care you need, when you need it.  Thank you for choosing [facility] at [facility]!!

## 2018-09-30 ENCOUNTER — Encounter (HOSPITAL_COMMUNITY): Payer: Medicare Other

## 2018-09-30 ENCOUNTER — Ambulatory Visit (HOSPITAL_COMMUNITY): Payer: Medicare Other

## 2018-10-02 ENCOUNTER — Ambulatory Visit (HOSPITAL_COMMUNITY): Payer: Medicare Other

## 2018-10-02 ENCOUNTER — Encounter (HOSPITAL_COMMUNITY): Payer: Medicare Other

## 2018-10-04 ENCOUNTER — Ambulatory Visit (HOSPITAL_COMMUNITY): Payer: Medicare Other

## 2018-10-04 ENCOUNTER — Telehealth (HOSPITAL_COMMUNITY): Payer: Self-pay | Admitting: *Deleted

## 2018-10-04 ENCOUNTER — Encounter (HOSPITAL_COMMUNITY): Payer: Medicare Other

## 2018-10-04 NOTE — Telephone Encounter (Signed)
Called to notify patient that the cardiac and pulmonary rehabilitation department remains closed at this time due to COVID-19 restrictions. Pt verbalized understanding. Pt is walking and gardening at home. Pt states that he's more active than he's been in years and looks forward to coming back once the program reopens.  Sol Passer, MS, ACSM CEP 10/04/2018 1406

## 2018-10-07 ENCOUNTER — Ambulatory Visit (HOSPITAL_COMMUNITY): Payer: Medicare Other

## 2018-10-07 ENCOUNTER — Encounter (HOSPITAL_COMMUNITY): Payer: Medicare Other

## 2018-10-09 ENCOUNTER — Ambulatory Visit (HOSPITAL_COMMUNITY): Payer: Medicare Other

## 2018-10-09 ENCOUNTER — Encounter (HOSPITAL_COMMUNITY): Payer: Medicare Other

## 2018-10-11 ENCOUNTER — Encounter (HOSPITAL_COMMUNITY): Payer: Medicare Other

## 2018-10-11 ENCOUNTER — Ambulatory Visit (HOSPITAL_COMMUNITY): Payer: Medicare Other

## 2018-10-14 ENCOUNTER — Ambulatory Visit (HOSPITAL_COMMUNITY): Payer: Medicare Other

## 2018-10-14 ENCOUNTER — Encounter (HOSPITAL_COMMUNITY): Payer: Medicare Other

## 2018-10-15 ENCOUNTER — Ambulatory Visit: Payer: Medicare Other | Admitting: Cardiology

## 2018-10-16 ENCOUNTER — Encounter (HOSPITAL_COMMUNITY): Payer: Medicare Other

## 2018-10-16 ENCOUNTER — Ambulatory Visit (HOSPITAL_COMMUNITY): Payer: Medicare Other

## 2018-10-18 ENCOUNTER — Ambulatory Visit (HOSPITAL_COMMUNITY): Payer: Medicare Other

## 2018-10-18 ENCOUNTER — Encounter (HOSPITAL_COMMUNITY): Payer: Medicare Other

## 2018-10-21 ENCOUNTER — Ambulatory Visit (HOSPITAL_COMMUNITY): Payer: Medicare Other

## 2018-10-21 ENCOUNTER — Encounter (HOSPITAL_COMMUNITY): Payer: Medicare Other

## 2018-10-21 ENCOUNTER — Encounter: Payer: Self-pay | Admitting: Internal Medicine

## 2018-10-23 ENCOUNTER — Ambulatory Visit (HOSPITAL_COMMUNITY): Payer: Medicare Other

## 2018-10-23 ENCOUNTER — Encounter (HOSPITAL_COMMUNITY): Payer: Medicare Other

## 2018-10-25 ENCOUNTER — Ambulatory Visit (HOSPITAL_COMMUNITY): Payer: Medicare Other

## 2018-10-25 ENCOUNTER — Encounter (HOSPITAL_COMMUNITY): Payer: Medicare Other

## 2018-10-28 ENCOUNTER — Encounter (HOSPITAL_COMMUNITY): Payer: Medicare Other

## 2018-10-28 ENCOUNTER — Ambulatory Visit (HOSPITAL_COMMUNITY): Payer: Medicare Other

## 2018-10-30 ENCOUNTER — Ambulatory Visit (HOSPITAL_COMMUNITY): Payer: Medicare Other

## 2018-10-30 ENCOUNTER — Encounter (HOSPITAL_COMMUNITY): Payer: Medicare Other

## 2018-11-01 ENCOUNTER — Ambulatory Visit (HOSPITAL_COMMUNITY): Payer: Medicare Other

## 2018-11-01 ENCOUNTER — Encounter (HOSPITAL_COMMUNITY): Payer: Medicare Other

## 2018-11-04 ENCOUNTER — Ambulatory Visit (HOSPITAL_COMMUNITY): Payer: Medicare Other

## 2018-11-04 ENCOUNTER — Encounter (HOSPITAL_COMMUNITY): Payer: Medicare Other

## 2018-11-06 ENCOUNTER — Ambulatory Visit (HOSPITAL_COMMUNITY): Payer: Medicare Other

## 2018-11-06 ENCOUNTER — Encounter (HOSPITAL_COMMUNITY): Payer: Medicare Other

## 2018-11-08 ENCOUNTER — Ambulatory Visit (HOSPITAL_COMMUNITY): Payer: Medicare Other

## 2018-11-08 ENCOUNTER — Encounter (HOSPITAL_COMMUNITY): Payer: Medicare Other

## 2018-11-11 ENCOUNTER — Ambulatory Visit (HOSPITAL_COMMUNITY): Payer: Medicare Other

## 2018-11-11 ENCOUNTER — Encounter (HOSPITAL_COMMUNITY): Payer: Medicare Other

## 2018-11-13 ENCOUNTER — Ambulatory Visit (HOSPITAL_COMMUNITY): Payer: Medicare Other

## 2018-11-13 ENCOUNTER — Encounter (HOSPITAL_COMMUNITY): Payer: Medicare Other

## 2018-11-15 ENCOUNTER — Ambulatory Visit (HOSPITAL_COMMUNITY): Payer: Medicare Other

## 2018-11-15 ENCOUNTER — Encounter (HOSPITAL_COMMUNITY): Payer: Medicare Other

## 2018-11-20 ENCOUNTER — Ambulatory Visit (HOSPITAL_COMMUNITY): Payer: Medicare Other

## 2018-11-20 ENCOUNTER — Encounter (HOSPITAL_COMMUNITY): Payer: Medicare Other

## 2018-11-21 ENCOUNTER — Telehealth (HOSPITAL_COMMUNITY): Payer: Self-pay | Admitting: *Deleted

## 2018-11-21 ENCOUNTER — Telehealth (HOSPITAL_COMMUNITY): Payer: Self-pay

## 2018-11-21 NOTE — Telephone Encounter (Signed)
Phone call made to Pt to provide information about virtual cardiac rehab. Pt did not answer. Message was left for Pt to return call.  

## 2018-11-21 NOTE — Telephone Encounter (Signed)
Please contact letter in regards to virtual cardiac rehab sent to address on file in epic. Await response, if no response by June 12th will discharge pt from the program.  Maurice Small RN, BSN Cardiac and Pulmonary Rehab Nurse Navigator

## 2018-11-22 ENCOUNTER — Encounter (HOSPITAL_COMMUNITY): Payer: Medicare Other

## 2018-11-22 ENCOUNTER — Ambulatory Visit (HOSPITAL_COMMUNITY): Payer: Medicare Other

## 2018-11-25 ENCOUNTER — Encounter (HOSPITAL_COMMUNITY): Payer: Medicare Other

## 2018-11-25 ENCOUNTER — Encounter: Payer: Self-pay | Admitting: Family Medicine

## 2018-11-25 ENCOUNTER — Ambulatory Visit (INDEPENDENT_AMBULATORY_CARE_PROVIDER_SITE_OTHER): Payer: Medicare Other | Admitting: Family Medicine

## 2018-11-25 ENCOUNTER — Ambulatory Visit (HOSPITAL_COMMUNITY): Payer: Medicare Other

## 2018-11-25 ENCOUNTER — Other Ambulatory Visit: Payer: Self-pay

## 2018-11-25 DIAGNOSIS — E119 Type 2 diabetes mellitus without complications: Secondary | ICD-10-CM

## 2018-11-25 DIAGNOSIS — E1159 Type 2 diabetes mellitus with other circulatory complications: Secondary | ICD-10-CM

## 2018-11-25 DIAGNOSIS — I2510 Atherosclerotic heart disease of native coronary artery without angina pectoris: Secondary | ICD-10-CM

## 2018-11-25 DIAGNOSIS — K317 Polyp of stomach and duodenum: Secondary | ICD-10-CM

## 2018-11-25 DIAGNOSIS — E785 Hyperlipidemia, unspecified: Secondary | ICD-10-CM

## 2018-11-25 DIAGNOSIS — Z8679 Personal history of other diseases of the circulatory system: Secondary | ICD-10-CM

## 2018-11-25 DIAGNOSIS — D500 Iron deficiency anemia secondary to blood loss (chronic): Secondary | ICD-10-CM | POA: Diagnosis not present

## 2018-11-25 DIAGNOSIS — E1169 Type 2 diabetes mellitus with other specified complication: Secondary | ICD-10-CM

## 2018-11-25 DIAGNOSIS — E782 Mixed hyperlipidemia: Secondary | ICD-10-CM

## 2018-11-25 DIAGNOSIS — R710 Precipitous drop in hematocrit: Secondary | ICD-10-CM

## 2018-11-25 DIAGNOSIS — K219 Gastro-esophageal reflux disease without esophagitis: Secondary | ICD-10-CM

## 2018-11-25 DIAGNOSIS — N400 Enlarged prostate without lower urinary tract symptoms: Secondary | ICD-10-CM

## 2018-11-25 DIAGNOSIS — R7989 Other specified abnormal findings of blood chemistry: Secondary | ICD-10-CM

## 2018-11-25 NOTE — Addendum Note (Signed)
Addended by: Zannie Cove on: 11/25/2018 01:52 PM   Modules accepted: Orders

## 2018-11-25 NOTE — Progress Notes (Signed)
Virtual Visit Via telephone Note I connected with@ on 11/25/18 by telephone and verified that I am speaking with the correct person or authorized healthcare agent using two identifiers. Marvin Frost is currently located at home and there are no unauthorized people in close proximity. I completed this visit while in a private location in my home .  This visit type was conducted due to national recommendations for restrictions regarding the COVID-19 Pandemic (e.g. social distancing).  This format is felt to be most appropriate for this patient at this time.  All issues noted in this document were discussed and addressed.  No physical exam was performed.    I discussed the limitations, risks, security and privacy concerns of performing an evaluation and management service by telephone and the availability of in person appointments. I also discussed with the patient that there may be a patient responsible charge related to this service. The patient expressed understanding and agreed to proceed.   Date:  11/25/2018    ID:  Marvin Frost      [birth year]        [id number]   Patient Care Team Patient Care Team: Chipper Herb, MD as PCP - General (Family Medicine) Minus Breeding, MD as PCP - Cardiology (Cardiology) Hayden Pedro, MD as Consulting Physician (Ophthalmology) Paralee Cancel, MD as Consulting Physician (Orthopedic Surgery) Minus Breeding, MD as Consulting Physician (Cardiology)  Reason for Visit: Primary Care Follow-up     History of Present Illness & Review of Systems:     Marvin Frost is a 73 y.o. year old male primary care patient that presents today for a telehealth visit.  The patient is pleasant and doing well.  He is followed regularly by the gastroenterologist and cardiologist.  He has had 1 virtual visit by the cardiologist.  He has a follow-up visit planned.  He saw the gastroenterologist a couple weeks ago and will see him again face-to-face hopefully in January 2021.   The patient says he is doing very well and feels good currently.  He is doing cardiac rehab at home.  He denies any chest pain or shortness of breath and is found that with his walking and exercise he is actually tolerating the walking better without any worsening symptoms.  He denies any trouble with heartburn indigestion nausea vomiting diarrhea or blood in the stool and is passing his water well.  His next eye exam is not due until November.  He does need refills on his Benicar HCT T.  Review of systems as stated, otherwise negative.  The patient does not have symptoms concerning for COVID-19 infection (fever, chills, cough, or new shortness of breath).      Current Medications (Verified) Allergies as of 11/25/2018      Reactions   Brilinta [ticagrelor]    Rash and SOB    Liraglutide Other (See Comments)   Abdominal pain and enlarged pancreas on CT ? pancreatitis   Baxinets [cetylpyridinium-benzocaine] Other (See Comments)   Unknown reaction   Celebrex [celecoxib] Nausea Only   Toprol Xl [metoprolol Succinate] Other (See Comments)   Unknown reaction - pt states it did not work   Triplex Ad Other (See Comments)   Abdominal pain and enlarged pancreas on CT? pancreatitis   Welchol [colesevelam Hcl] Other (See Comments)   constipation   Norvasc [amlodipine Besylate] Rash   Statins Other (See Comments)   muscle and joint pain if taken for long periods of time, takes Crestor every 2-3 days  to avoid this reaction      Medication List       Accurate as of November 25, 2018  9:30 AM. If you have any questions, ask your nurse or doctor.        amoxicillin 500 MG capsule Commonly known as:  AMOXIL Take 4 capsules (2,000 mg total) by mouth See admin instructions. Take four capsules (2000 mg) by mouth one hour prior to dental appointments   aspirin 81 MG EC tablet Take 1 tablet (81 mg total) by mouth daily.   carvedilol 3.125 MG tablet Commonly known as:  COREG Take 1 tablet (3.125 mg  total) by mouth 2 (two) times daily.   cetirizine 10 MG tablet Commonly known as:  ZYRTEC Take 10 mg by mouth as needed for allergies or rhinitis.   clopidogrel 75 MG tablet Commonly known as:  PLAVIX Take 1 tablet (75 mg total) by mouth daily.   Empagliflozin-metFORMIN HCl 12.10-998 MG Tabs Commonly known as:  Synjardy Take 1 tablet by mouth 2 (two) times daily.   ferrous sulfate 325 (65 FE) MG tablet Take 1 tablet (325 mg total) by mouth every other day. What changed:  when to take this   freestyle lancets Check BS BID and PRN. DX.E11.9   FreeStyle Lite Devi Use to check BS bid. DX. E11.9   glucose blood test strip Commonly known as:  FREESTYLE LITE USE TO CHECK BLOOD SUGAR TWICE A DAY AND AS NEEDED   insulin aspart 100 UNIT/ML FlexPen Commonly known as:  NovoLOG FlexPen INJECT 16 TO 20 UNITS UNDER THE SKIN TWICE A DAY   Insulin Degludec 200 UNIT/ML Sopn Commonly known as:  [place] FlexTouch Inject 60-70 Units into the skin daily.   Insulin Pen Needle 32G X 5 MM Misc Commonly known as:  NovoTwist USE WITH INSULIN PENS UP TO 5 TIMES DAILY   nitroGLYCERIN 0.4 MG SL tablet Commonly known as:  NITROSTAT Place 1 tablet (0.4 mg total) under the tongue every 5 (five) minutes as needed for chest pain.   olmesartan-hydrochlorothiazide 40-25 MG tablet Commonly known as:  Benicar HCT Take 1 tablet by mouth daily.   pantoprazole 40 MG tablet Commonly known as:  PROTONIX Take 1 tablet (40 mg total) by mouth 2 (two) times daily after a meal.   Repatha SureClick 315 MG/ML Soaj Generic drug:  Evolocumab INJECT 140 MG UNDER THE SKIN EVERY 14 DAYS   rosuvastatin 20 MG tablet Commonly known as:  Crestor Take 1 tablet (20 mg total) by mouth daily.           Allergies (Verified)    Brilinta [ticagrelor]; Liraglutide; Baxinets [cetylpyridinium-benzocaine]; Celebrex [celecoxib]; Toprol xl [metoprolol succinate]; Triplex ad; Welchol [colesevelam hcl]; Norvasc [amlodipine  besylate]; and Statins  Past Medical History Past Medical History:  Diagnosis Date  . Allergy   . Arthritis   . CAD (coronary artery disease)   . 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 Cataract   . Diabetes mellitus   . Dysrhythmia    history of heart palpitations   . Gastric polyps 09/18/2017  . GERD (gastroesophageal reflux disease)   . Heart attack ([place])   . History of frequent urinary tract infections   . History of hiatal hernia   . Hyperlipemia   . Hypertension   . Obesity   . Pancreatitis      Past Surgical History:  Procedure Laterality Date  . CARDIAC CATHETERIZATION  2004 and 1999- done by Dr Percival Spanish   . COLONOSCOPY  11/2003   diverticulosis, no polyps  . CORONARY STENT INTERVENTION N/A 05/28/2018   Procedure: CORONARY STENT INTERVENTION;  Surgeon: Sherren Mocha, MD;  Location: [facility];  Service: Cardiovascular;  Laterality: N/A;  . EYE SURGERY Bilateral    cateracts - dr Katy Fitch  . HYDROCELE EXCISION / REPAIR    . INTRAVASCULAR PRESSURE WIRE/FFR STUDY N/A 05/28/2018   Procedure: INTRAVASCULAR PRESSURE WIRE/FFR STUDY;  Surgeon: Sherren Mocha, MD;  Location: [facility];  Service: Cardiovascular;  Laterality: N/A;  . left calf surgery      related to trauma of being torn   . LEFT HEART CATH AND CORONARY ANGIOGRAPHY N/A 05/28/2018   Procedure: LEFT HEART CATH AND CORONARY ANGIOGRAPHY;  Surgeon: Sherren Mocha, MD;  Location: [facility];  Service: Cardiovascular;  Laterality: N/A;  . left index finger arthrioscopic surgery     . lymph node removed from right neck     . right shoulder surgery    . TOTAL HIP ARTHROPLASTY Right 03/16/2015   Procedure: RIGHT TOTAL HIP ARTHROPLASTY ANTERIOR APPROACH;  Surgeon: Paralee Cancel, MD;  Location: [facility];  Service: Orthopedics;  Laterality: Right;  . TOTAL HIP ARTHROPLASTY Left 06/22/2015   Procedure: LEFT TOTAL HIP ARTHROPLASTY ANTERIOR APPROACH;  Surgeon: Paralee Cancel,  MD;  Location: [facility];  Service: Orthopedics;  Laterality: Left;  . UPPER GASTROINTESTINAL ENDOSCOPY     hiatal hernia 2cm, mild gastritis - 2012, multiple hyperplastic and fundic gland gastric polyps 2019    Social History   Socioeconomic History  . Marital status: Married    Spouse name: Not on file  . Number of children: 1  . Years of education: Not on file  . Highest education level: Associate degree: occupational, Hotel manager, or vocational program  Occupational History  . Occupation: Best boy: [employer]    Comment: retired  Scientific laboratory technician  . Financial resource strain: Not hard at all  . Food insecurity:    Worry: Never true    Inability: Never true  . Transportation needs:    Medical: No    Non-medical: No  Tobacco Use  . Smoking status: Former Smoker    Packs/day: 1.00    Years: 10.00    Pack years: 10.00    Types: Cigarettes    Last attempt to quit: 04/06/1984    Years since quitting: 34.6  . Smokeless tobacco: Former Systems developer    Types: Between date: 06/26/1978  Substance and Sexual Activity  . Alcohol use: No  . Drug use: No  . Sexual activity: Yes  Lifestyle  . Physical activity:    Days per week: 5 days    Minutes per session: 20 min  . Stress: Not at all  Relationships  . Social connections:    Talks on phone: More than three times a week    Gets together: More than three times a week    Attends religious service: More than 4 times per year    Active member of club or organization: Yes    Attends meetings of clubs or organizations: More than 4 times per year    Relationship status: Married  Other Topics Concern  . Not on file  Social History Narrative   Married   Retired from [employer]   1 child   Former but no current tobacco   No EtOH/drugs     Family  History  Problem Relation Age of Onset  . Heart disease Mother   . Heart attack Mother   . Diabetes Brother   . Heart disease Brother   . Heart attack Brother 51  .  Heart attack Father 18  . GI problems Sister        torn esophagus during endoscopy  . Healthy Daughter   . Colon polyps Neg Hx   . Esophageal cancer Neg Hx   . Rectal cancer Neg Hx   . Stomach cancer Neg Hx       Labs/Other Tests and Data Reviewed:    Wt Readings from Last 3 Encounters:  09/27/18 223 lb (101.2 kg)  08/20/18 228 lb 8 oz (103.6 kg)  08/13/18 230 lb 9.6 oz (104.6 kg)   Temp Readings from Last 3 Encounters:  07/01/18 (!) 97.1 F (36.2 C) (Oral)  05/29/18 97.8 F (36.6 C) (Oral)   BP Readings from Last 3 Encounters:  09/27/18 124/76  08/20/18 130/66  08/13/18 132/72   Pulse Readings from Last 3 Encounters:  09/27/18 79  08/20/18 84  08/13/18 78     Lab Results  Component Value Date   HGBA1C 7.1 (H) 07/09/2018   HGBA1C 7.8 (H) 02/19/2018   HGBA1C 8.1 (H) 10/15/2017   Lab Results  Component Value Date   MICROALBUR 20 01/26/2015   LDLCALC 26 08/19/2018   CREATININE 1.07 07/09/2018       Chemistry      Component Value Date/Time   NA 136 07/09/2018 1114   K 4.2 07/09/2018 1114   CL 97 07/09/2018 1114   CO2 22 07/09/2018 1114   BUN 10 07/09/2018 1114   CREATININE 1.07 07/09/2018 1114   CREATININE 0.95 12/31/2012 0857      Component Value Date/Time   CALCIUM 9.2 07/09/2018 1114   ALKPHOS 56 08/19/2018 0955   AST 23 08/19/2018 0955   ALT 26 08/19/2018 0955   BILITOT 0.3 08/19/2018 0955         OBSERVATIONS/ OBJECTIVE:     Patient is alert and related his history well to me.  His feet have been without redness or signs of infection.  His blood sugars have been running in the 90-130 range fasting thanks to help from the clinical pharmacist.  Daytime blood sugars may run higher but he is also checking these 1 hour after eating.  His weight is actually down and is 215 pounds.  His blood pressures are running in the 1 26-1 35 range over the 70 range.  He is trying to walk regularly at least three fourths of a mile daily.  He is doing better  with the medicine that he was placed on by Dr. Percival Spanish.  Physical exam deferred due to nature of telephonic visit.  ASSESSMENT & PLAN    Time:   I spent 27 minutes with the patient via telephone discussing the above including Covid precautions.     Visit Diagnoses: 1. Low serum vitamin D - continue with current vitamin D dosing pending results of lab work  2. Hyperlipidemia associated with type 2 diabetes mellitus ([place]) -Continue to walk and exercise regularly with all efforts to keep weight down to a minimum and follow aggressive therapeutic lifestyle changes  3. Decreased hemoglobin -Continue with iron replacement and check serum iron with next lab work  4. Type 2 diabetes mellitus not at goal [facility]) -Check blood sugars regularly exercise regularly and make all efforts to keep weight down follow-up with clinical pharmacist if  needs are present for adjustment  5. Combined hyperlipidemia associated with type 2 diabetes mellitus ([place]) -Continue aggressive therapeutic lifestyle changes geared to weight loss through diet and exercise  6. Type 2 diabetes mellitus with other circulatory complications ([facility]) -Blood sugars at home seem to be doing better.  The fasting blood sugars are running between the 90s in the 130 range.  He has had some high readings during the day but he is checking these sometimes 1 hour after eating I told him it would be better to check it before the next meal or at least 2 hours after eating.  7. Gastroesophageal reflux disease, esophagitis presence not specified -Continue with Nexium as currently doing  8. Multiple gastric polyps -Gastroenterology as planned  9. Hyperlipidemia LDL goal <70 -Continue with rosuvastatin 3 days weekly along with Repatha and aggressive therapeutic lifestyle changes  10. Iron deficiency anemia due to chronic blood loss -Continue with iron replacement pending results of lab work and serum iron levels  11. History of ASCVD  -Follow-up with cardiology as planned     The above assessment and management plan was discussed with the patient. The patient verbalized understanding of and has agreed to the management plan. Patient is aware to call the clinic if symptoms persist or worsen. Patient is aware when to return to the clinic for a follow-up visit. Patient educated on when it is appropriate to go to the emergency department.    Chipper Herb, MD [address], [place], [place] [address] Ph [phone]   Arrie Senate MD

## 2018-11-25 NOTE — Patient Instructions (Addendum)
Continue to follow-up with cardiology and gastroenterology Monitor CBCs regularly Avoid NSAIDs Continue to practice good hand and respiratory hygiene Continue to walk regularly and stay as active as physically possible to keep weight down. Monitor blood sugars regularly and feet. Drink plenty of water and fluids always Continue with iron replacement pending results of CBC and iron levels Follow-up with diabetic educator as needed

## 2018-12-02 ENCOUNTER — Telehealth (HOSPITAL_COMMUNITY): Payer: Self-pay | Admitting: *Deleted

## 2018-12-02 ENCOUNTER — Encounter (HOSPITAL_COMMUNITY): Payer: Self-pay | Admitting: *Deleted

## 2018-12-02 ENCOUNTER — Encounter (HOSPITAL_COMMUNITY)
Admission: RE | Admit: 2018-12-02 | Discharge: 2018-12-02 | Disposition: A | Discharge status: Home / Self Care | Payer: Medicare Other | Source: Ambulatory Visit | Attending: Cardiology | Admitting: Cardiology

## 2018-12-02 DIAGNOSIS — Z955 Presence of coronary angioplasty implant and graft: Secondary | ICD-10-CM | POA: Insufficient documentation

## 2018-12-02 NOTE — Progress Notes (Signed)
Pt is interested in Virtual Cardiac Rehab.   Checklist: 1. Pt has smart device  ie smartphone and/or ipad for downloading an app  Yes 2. Reliable internet/wifi service    Yes 3. Understands how to use his smartphone and navigate within an app.  Yes Reviewed with pt the scheduling process for virtual cardiac rehab.  Pt verbalized understanding.  Called and spoke to pt regarding Virtual Cardiac Rehab.  Pt  was able to download the [facility] app on their smart device with no issues. Pt set up their account and received the following welcome message -"Welcome to the [facility] and Pul[facility] Rehabilitation program. We hope that you will find the exercise program beneficial in your recovery process. Our staff is available to assist with any questions/concerns about your exercise routine. Best wishes". Brief orientation provided to with the advisement to watch the "Intro to Rehab" series located under the Resource tab. Pt verbalized understanding. Will continue to follow and monitor pt progress with feedback as needed.Barnet Pall, RN,BSN 12/02/2018 9:59 AM

## 2018-12-02 NOTE — Telephone Encounter (Signed)
         Confirm Consent - In the setting of the current Covid19 crisis, you are scheduled for a phone visit with your Cardiac or Pulmonary team member.  Just as we do with many in-gym visits, in order for you to participate in this visit, we must obtain consent.  If you'd like, I can send this to your mychart (if signed up) or email for you to review.  Otherwise, I can obtain your verbal consent now.  By agreeing to a telephone visit, we'd like you to understand that the technology does not allow for your Cardiac or Pulmonary Rehab team member to perform a physical assessment, and thus may limit their ability to fully assess your ability to perform exercise programs. If your provider identifies any concerns that need to be evaluated in person, we will make arrangements to do so.  Finally, though the technology is pretty good, we cannot assure that it will always work on either your or our end and we cannot ensure that we have a secure connection.  Cardiac and Pulmonary Rehab Telehealth visits and "At Home" cardiac and pulmonary rehab are provided at no cost to you.               Are you willing to proceed?" STAFF: Did the patient verbally acknowledge consent to telehealth visit? Document YES/NO here: yes      Barnet Pall RN BSN   Cardiac and Pulmonary Rehab Staff   June 8, 9:36 AM

## 2018-12-03 ENCOUNTER — Other Ambulatory Visit: Payer: Self-pay | Admitting: Dermatology

## 2018-12-03 DIAGNOSIS — C44311 Basal cell carcinoma of skin of nose: Secondary | ICD-10-CM | POA: Diagnosis not present

## 2018-12-03 DIAGNOSIS — L570 Actinic keratosis: Secondary | ICD-10-CM | POA: Diagnosis not present

## 2018-12-05 IMAGING — NM NM BONE 3 PHASE
10 series · 20 of 20 positions shown · non-contrast
Comparison: None

Radiographic correlation: None

CLINICAL DATA: LEFT hip joint pain, LEFT anterior thigh pain for 12
months worse in past 2 months, RIGHT sciatica-like buttock pain,
prior BILATERAL total hip arthroplasty RIGHT [DATE], LEFT [DATE]

EXAM:
NUCLEAR MEDICINE 3-PHASE BONE SCAN
TECHNIQUE: Radionuclide angiographic images, immediate static blood pool
images, and 3-hour delayed static images were obtained of the hips
after intravenous injection of radiopharmaceutical.
RADIOPHARMACEUTICALS:  21.6 mCi 5c-CCm MDP IV

[Series 1: flow · 2.07mm/px · 6 of 48 frames shown (1 of 2)]
[frame 5/48  full-range]
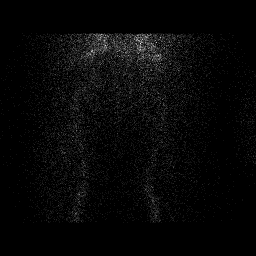
[frame 13/48  full-range]
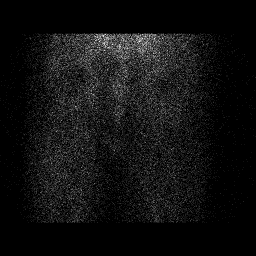
[frame 21/48  full-range]
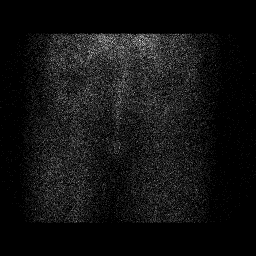
[frame 29/48  full-range]
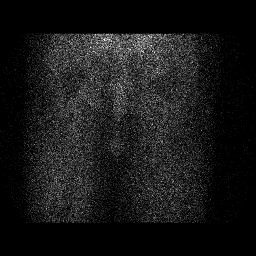
[frame 37/48  full-range]
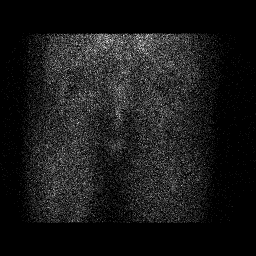
[frame 45/48  full-range]
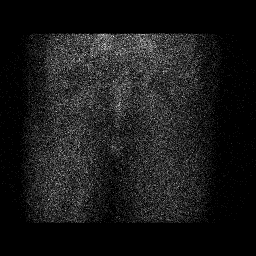

[Series 1: flow · 2.07mm/px · 6 of 48 frames shown (2 of 2)]
[frame 5/48  full-range]
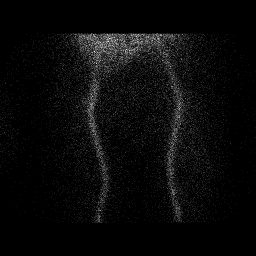
[frame 13/48  full-range]
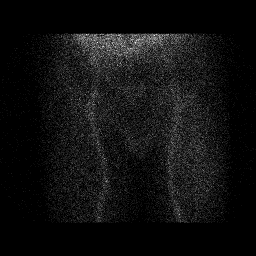
[frame 21/48  full-range]
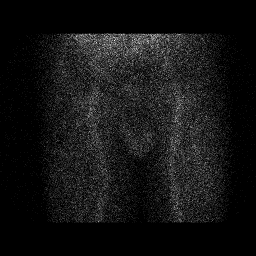
[frame 29/48  full-range]
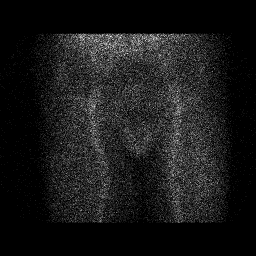
[frame 37/48  full-range]
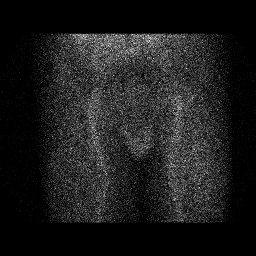
[frame 45/48  full-range]
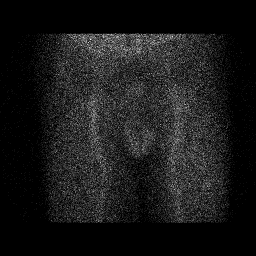

[Series 2: blood pool · 2.07mm/px · 1 of 1 slices shown (1 of 6)]
[im 1/1]
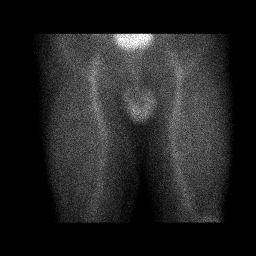

[Series 2: blood pool · 2.07mm/px · 1 of 1 slices shown (2 of 6)]
[im 1/1]
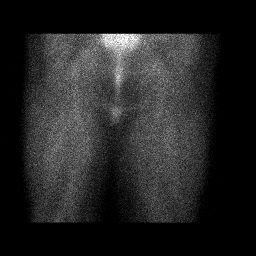

[Series 3: lat bp · 2.07mm/px · 1 of 1 slices shown (1 of 2)]
[im 1/1]
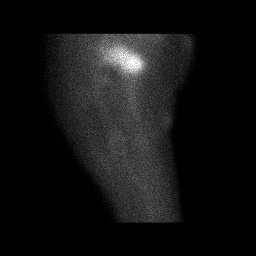

[Series 3: lat bp · 2.07mm/px · 1 of 1 slices shown (2 of 2)]
[im 1/1]
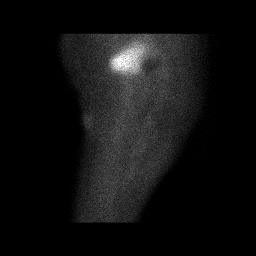

[Series 4: blood pool · 2.07mm/px · 1 of 1 slices shown (3 of 6)]
[im 1/1]
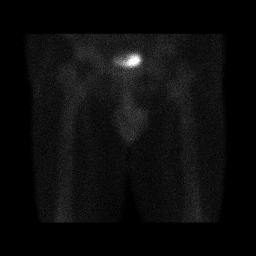

[Series 4: blood pool · 2.07mm/px · 1 of 1 slices shown (4 of 6)]
[im 1/1]
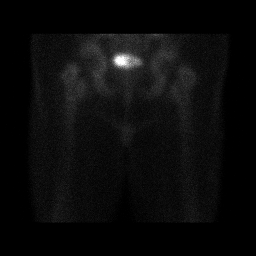

[Series 5: blood pool · 2.07mm/px · 1 of 1 slices shown (5 of 6)]
[im 1/1]
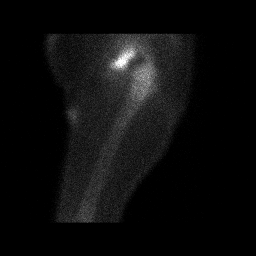

[Series 5: blood pool · 2.07mm/px · 1 of 1 slices shown (6 of 6)]
[im 1/1]
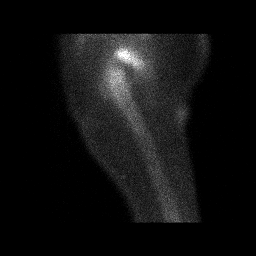

[20 of 20 positions shown; findings below may reference images not displayed]

FINDINGS: Vascular phase: Symmetric blood flow to both hip regions

Blood pool phase: Symmetric normal blood pool at both hip regions

Delayed phase: Photopenic defects at both hips from hip prostheses.
Normal osseous tracer distribution at remaining osseous structures.
No abnormal osseous tracer distribution is identified adjacent to
either hip prosthesis to suggest aseptic loosening or infection.
IMPRESSION: Normal three-phase bone scan of the hips post BILATERAL total hip
arthroplasty.

## 2018-12-12 ENCOUNTER — Other Ambulatory Visit: Payer: Self-pay

## 2018-12-12 ENCOUNTER — Other Ambulatory Visit: Payer: Medicare Other

## 2018-12-12 DIAGNOSIS — Z8679 Personal history of other diseases of the circulatory system: Secondary | ICD-10-CM

## 2018-12-12 DIAGNOSIS — E782 Mixed hyperlipidemia: Secondary | ICD-10-CM

## 2018-12-12 DIAGNOSIS — R7989 Other specified abnormal findings of blood chemistry: Secondary | ICD-10-CM

## 2018-12-12 DIAGNOSIS — E119 Type 2 diabetes mellitus without complications: Secondary | ICD-10-CM | POA: Diagnosis not present

## 2018-12-12 DIAGNOSIS — N400 Enlarged prostate without lower urinary tract symptoms: Secondary | ICD-10-CM

## 2018-12-12 DIAGNOSIS — D500 Iron deficiency anemia secondary to blood loss (chronic): Secondary | ICD-10-CM

## 2018-12-12 DIAGNOSIS — K317 Polyp of stomach and duodenum: Secondary | ICD-10-CM

## 2018-12-12 DIAGNOSIS — K219 Gastro-esophageal reflux disease without esophagitis: Secondary | ICD-10-CM

## 2018-12-12 DIAGNOSIS — E1169 Type 2 diabetes mellitus with other specified complication: Secondary | ICD-10-CM

## 2018-12-12 DIAGNOSIS — E559 Vitamin D deficiency, unspecified: Secondary | ICD-10-CM | POA: Diagnosis not present

## 2018-12-12 DIAGNOSIS — E785 Hyperlipidemia, unspecified: Secondary | ICD-10-CM | POA: Diagnosis not present

## 2018-12-12 DIAGNOSIS — R710 Precipitous drop in hematocrit: Secondary | ICD-10-CM

## 2018-12-12 LAB — BAYER DCA HB A1C WAIVED: HB A1C (BAYER DCA - WAIVED): 8.2 % — ABNORMAL HIGH (ref <7.0)

## 2018-12-12 LAB — LIPID PANEL

## 2018-12-13 ENCOUNTER — Other Ambulatory Visit: Payer: Self-pay | Admitting: *Deleted

## 2018-12-13 DIAGNOSIS — E119 Type 2 diabetes mellitus without complications: Secondary | ICD-10-CM

## 2018-12-13 LAB — CBC WITH DIFFERENTIAL/PLATELET
Basophils Absolute: 0.1 10*3/uL (ref 0.0–0.2)
Basos: 2 %
EOS (ABSOLUTE): 0.5 10*3/uL — ABNORMAL HIGH (ref 0.0–0.4)
Eos: 5 %
Hematocrit: 46.2 % (ref 37.5–51.0)
Hemoglobin: 14.7 g/dL (ref 13.0–17.7)
Immature Grans (Abs): 0 10*3/uL (ref 0.0–0.1)
Immature Granulocytes: 0 %
Lymphocytes Absolute: 1.3 10*3/uL (ref 0.7–3.1)
Lymphs: 15 %
MCH: 25.1 pg — ABNORMAL LOW (ref 26.6–33.0)
MCHC: 31.8 g/dL (ref 31.5–35.7)
MCV: 79 fL (ref 79–97)
Monocytes Absolute: 0.6 10*3/uL (ref 0.1–0.9)
Monocytes: 7 %
Neutrophils Absolute: 6.1 10*3/uL (ref 1.4–7.0)
Neutrophils: 71 %
Platelets: 247 10*3/uL (ref 150–450)
RBC: 5.85 x10E6/uL — ABNORMAL HIGH (ref 4.14–5.80)
RDW: 18.8 % — ABNORMAL HIGH (ref 11.6–15.4)
WBC: 8.7 10*3/uL (ref 3.4–10.8)

## 2018-12-13 LAB — HEPATIC FUNCTION PANEL
ALT: 13 IU/L (ref 0–44)
AST: 12 IU/L (ref 0–40)
Albumin: 4 g/dL (ref 3.7–4.7)
Alkaline Phosphatase: 58 IU/L (ref 39–117)
Bilirubin Total: 0.2 mg/dL (ref 0.0–1.2)
Bilirubin, Direct: 0.1 mg/dL (ref 0.00–0.40)
Total Protein: 6.3 g/dL (ref 6.0–8.5)

## 2018-12-13 LAB — LIPID PANEL
Chol/HDL Ratio: 4.3 ratio (ref 0.0–5.0)
Cholesterol, Total: 166 mg/dL (ref 100–199)
HDL: 39 mg/dL — ABNORMAL LOW (ref >39)
LDL Calculated: 69 mg/dL (ref 0–99)
Triglycerides: 289 mg/dL — ABNORMAL HIGH (ref 0–149)
VLDL Cholesterol Cal: 58 mg/dL — ABNORMAL HIGH (ref 5–40)

## 2018-12-13 LAB — PSA, TOTAL AND FREE
PSA, Free Pct: 26.3 %
PSA, Free: 0.5 ng/mL
Prostate Specific Ag, Serum: 1.9 ng/mL (ref 0.0–4.0)

## 2018-12-13 LAB — VITAMIN D 25 HYDROXY (VIT D DEFICIENCY, FRACTURES): Vit D, 25-Hydroxy: 50.4 ng/mL (ref 30.0–100.0)

## 2018-12-13 LAB — BMP8+EGFR
BUN/Creatinine Ratio: 13 (ref 10–24)
BUN: 11 mg/dL (ref 8–27)
CO2: 21 mmol/L (ref 20–29)
Calcium: 8.9 mg/dL (ref 8.6–10.2)
Chloride: 102 mmol/L (ref 96–106)
Creatinine, Ser: 0.84 mg/dL (ref 0.76–1.27)
GFR calc Af Amer: 99 mL/min/{1.73_m2} (ref >59)
GFR calc non Af Amer: 86 mL/min/{1.73_m2} (ref >59)
Glucose: 152 mg/dL — ABNORMAL HIGH (ref 65–99)
Potassium: 4.2 mmol/L (ref 3.5–5.2)
Sodium: 137 mmol/L (ref 134–144)

## 2018-12-13 LAB — MICROALBUMIN / CREATININE URINE RATIO
Creatinine, Urine: 96.4 mg/dL
Microalb/Creat Ratio: 3 mg/g creat (ref 0–29)
Microalbumin, Urine: 3.3 ug/mL

## 2018-12-13 LAB — IRON: Iron: 35 ug/dL — ABNORMAL LOW (ref 38–169)

## 2018-12-20 ENCOUNTER — Telehealth (HOSPITAL_COMMUNITY): Payer: Self-pay | Admitting: *Deleted

## 2018-12-23 ENCOUNTER — Telehealth (HOSPITAL_COMMUNITY): Payer: Self-pay | Admitting: *Deleted

## 2018-12-23 NOTE — Telephone Encounter (Signed)
-----   Message from Minus Breeding, MD sent at 12/22/2018 11:13 AM EDT ----- Regarding: RE: phase 2 cardiac rehab OK ----- Message ----- From: Magda Kiel, RN Sent: 12/20/2018   1:55 PM EDT To: Minus Breeding, MD Subject: phase 2 cardiac rehab                          Good afternoon Dr Percival Spanish,  Cardiac rehab is resuming next week. Mr Gautreau would like to return to exercise if you think its okay to do so. Mr Ploch had completed 8 exercise sessions prior to the closure of our department due to the [place] 19 pandemic.  Safety measures, social distancing and reduced classes will be in place.  Thanks for your input!   Sincerely,  Barnet Pall RN Cardiac rehab

## 2018-12-24 ENCOUNTER — Telehealth (HOSPITAL_COMMUNITY): Payer: Self-pay

## 2018-12-24 NOTE — Telephone Encounter (Signed)
Attempted to contact pt in regards to rescheduling for CR, LMTCB.

## 2018-12-26 ENCOUNTER — Telehealth (HOSPITAL_COMMUNITY): Payer: Self-pay | Admitting: *Deleted

## 2018-12-26 NOTE — Telephone Encounter (Signed)
Pt returned call for message left on voicemail.  Pt is interested in resuming cardiac rehab.  Called and unable to leave a message for him - no voicemail. Will try again later.Cherre Huger, BSN Cardiac and Training and development officer

## 2019-01-02 ENCOUNTER — Telehealth (HOSPITAL_COMMUNITY): Payer: Self-pay

## 2019-01-06 ENCOUNTER — Other Ambulatory Visit: Payer: Self-pay

## 2019-01-06 ENCOUNTER — Encounter (HOSPITAL_COMMUNITY)
Admission: RE | Admit: 2019-01-06 | Discharge: 2019-01-06 | Disposition: A | Discharge status: Home / Self Care | Payer: Medicare Other | Source: Ambulatory Visit | Attending: Cardiology | Admitting: Cardiology

## 2019-01-06 DIAGNOSIS — Z955 Presence of coronary angioplasty implant and graft: Secondary | ICD-10-CM | POA: Diagnosis not present

## 2019-01-06 NOTE — Progress Notes (Signed)
Daily Session Note  Patient Details  Name: Marvin Frost MRN: [id number] Date of Birth: January 08, 1946 Referring Provider:     [facility] from 08/13/2018 in [facility] [place]  Referring Provider  Minus Breeding, MD      Encounter Date: 01/06/2019  Check In: Session Check In - 01/06/19 1355      Check-In   Supervising physician immediately available to respond to emergencies  [facility] immediately available    Physician(s)  Dr. Louanne Belton    Location  [facility]    Staff Present  Jiles Garter, RN, Mosie Epstein, MS,ACSM CEP, Exercise Physiologist;Olinty Celesta Aver, MS, ACSM CEP, Exercise Physiologist;Jr Milliron, RN, BSN    Virtual Visit  No    Medication changes reported      No    Fall or balance concerns reported     No    Tobacco Cessation  No Change    Warm-up and Cool-down  Performed on first and last piece of equipment    Resistance Training Performed  Yes    VAD Patient?  No    PAD/SET Patient?  No      Pain Assessment   Currently in Pain?  No/denies    Multiple Pain Sites  No       Capillary Blood Glucose: No results found for this or any previous visit (from the past 24 hour(s)).    Social History   Tobacco Use  Smoking Status Former Smoker  . Packs/day: 1.00  . Years: 10.00  . Pack years: 10.00  . Types: Cigarettes  . Quit date: 04/06/1984  . Years since quitting: 34.7  Smokeless Tobacco Former Systems developer  . Types: Chew  . Quit date: 06/26/1978    Goals Met:  No report of cardiac concerns or symptoms  Goals Unmet:  Not Applicable  Comments: Marvin Frost resumed exercise without difficulty or complaints. Social distancing and safety measures in place per CDC guidelines.Will continue to monitor the patient throughout  the program.Marvin Frost Venetia Maxon, RN,BSN 01/06/2019 2:27 PM    Dr. Fransico Him is Medical Director for Cardiac Rehab at [facility].

## 2019-01-08 ENCOUNTER — Encounter (HOSPITAL_COMMUNITY)
Admission: RE | Admit: 2019-01-08 | Discharge: 2019-01-08 | Disposition: A | Discharge status: Home / Self Care | Payer: Medicare Other | Source: Ambulatory Visit | Attending: Cardiology | Admitting: Cardiology

## 2019-01-08 ENCOUNTER — Other Ambulatory Visit: Payer: Self-pay

## 2019-01-08 DIAGNOSIS — Z955 Presence of coronary angioplasty implant and graft: Secondary | ICD-10-CM

## 2019-01-10 ENCOUNTER — Encounter (HOSPITAL_COMMUNITY)
Admission: RE | Admit: 2019-01-10 | Discharge: 2019-01-10 | Disposition: A | Discharge status: Home / Self Care | Payer: Medicare Other | Source: Ambulatory Visit | Attending: Cardiology | Admitting: Cardiology

## 2019-01-10 ENCOUNTER — Other Ambulatory Visit: Payer: Self-pay

## 2019-01-10 DIAGNOSIS — Z955 Presence of coronary angioplasty implant and graft: Secondary | ICD-10-CM | POA: Diagnosis not present

## 2019-01-13 ENCOUNTER — Encounter (HOSPITAL_COMMUNITY)
Admission: RE | Admit: 2019-01-13 | Discharge: 2019-01-13 | Disposition: A | Discharge status: Home / Self Care | Payer: Medicare Other | Source: Ambulatory Visit | Attending: Cardiology | Admitting: Cardiology

## 2019-01-13 ENCOUNTER — Other Ambulatory Visit: Payer: Self-pay

## 2019-01-13 DIAGNOSIS — Z955 Presence of coronary angioplasty implant and graft: Secondary | ICD-10-CM

## 2019-01-15 ENCOUNTER — Encounter (HOSPITAL_COMMUNITY)
Admission: RE | Admit: 2019-01-15 | Discharge: 2019-01-15 | Disposition: A | Discharge status: Home / Self Care | Payer: Medicare Other | Source: Ambulatory Visit | Attending: Cardiology | Admitting: Cardiology

## 2019-01-15 ENCOUNTER — Other Ambulatory Visit: Payer: Self-pay

## 2019-01-15 DIAGNOSIS — Z955 Presence of coronary angioplasty implant and graft: Secondary | ICD-10-CM | POA: Diagnosis not present

## 2019-01-16 ENCOUNTER — Other Ambulatory Visit: Payer: Self-pay

## 2019-01-16 ENCOUNTER — Encounter: Payer: Self-pay | Admitting: "Endocrinology

## 2019-01-16 ENCOUNTER — Ambulatory Visit (INDEPENDENT_AMBULATORY_CARE_PROVIDER_SITE_OTHER): Payer: Medicare Other | Admitting: "Endocrinology

## 2019-01-16 VITALS — BP 132/71 | HR 76 | Ht 67.0 in | Wt 214.0 lb

## 2019-01-16 DIAGNOSIS — E1159 Type 2 diabetes mellitus with other circulatory complications: Secondary | ICD-10-CM | POA: Insufficient documentation

## 2019-01-16 DIAGNOSIS — I10 Essential (primary) hypertension: Secondary | ICD-10-CM

## 2019-01-16 DIAGNOSIS — I2510 Atherosclerotic heart disease of native coronary artery without angina pectoris: Secondary | ICD-10-CM | POA: Diagnosis not present

## 2019-01-16 DIAGNOSIS — E782 Mixed hyperlipidemia: Secondary | ICD-10-CM | POA: Diagnosis not present

## 2019-01-16 MED ORDER — FREESTYLE LIBRE 14 DAY READER DEVI: 1.0000 | Freq: Once | 0 refills | Status: AC

## 2019-01-16 MED ORDER — NOVOLOG FLEXPEN 100 UNIT/ML ~~LOC~~ SOPN: PEN_INJECTOR | SUBCUTANEOUS | 3 refills | Status: DC

## 2019-01-16 MED ORDER — FREESTYLE LIBRE 14 DAY SENSOR MISC: 1.0000 | 2 refills | Status: DC

## 2019-01-16 MED ORDER — TRESIBA FLEXTOUCH 200 UNIT/ML ~~LOC~~ SOPN: 70.0000 [IU] | PEN_INJECTOR | Freq: Every day | SUBCUTANEOUS | 3 refills | Status: DC

## 2019-01-16 NOTE — Progress Notes (Signed)
Cardiac Individual Treatment Plan  Patient Details  Name: [place] Retana MRN: [id number] Date of Birth: 1945/09/22 Referring Provider:     [place] from 08/13/2018 in [place]  Referring Provider  Minus Breeding, MD      Initial Encounter Date:    CARDIAC REHAB PHASE II ORIENTATION from 08/13/2018 in [place]  Date  08/13/18      Visit Diagnosis: S/P DES LAD 05/28/18  Patient's Home Medications on Admission:  Current Outpatient Medications:  .  amoxicillin (AMOXIL) 500 MG capsule, Take 4 capsules (2,000 mg total) by mouth See admin instructions. Take four capsules (2000 mg) by mouth one hour prior to dental appointments, Disp: 4 capsule, Rfl: 5 .  aspirin EC 81 MG EC tablet, Take 1 tablet (81 mg total) by mouth daily., Disp: , Rfl:  .  Blood Glucose Monitoring Suppl (FREESTYLE LITE) DEVI, Use to check BS bid. DX. E11.9, Disp: 1 each, Rfl: 0 .  carvedilol (COREG) 3.125 MG tablet, Take 1 tablet (3.125 mg total) by mouth 2 (two) times daily., Disp: 180 tablet, Rfl: 3 .  cetirizine (ZYRTEC) 10 MG tablet, Take 10 mg by mouth as needed for allergies or rhinitis. , Disp: , Rfl:  .  clopidogrel (PLAVIX) 75 MG tablet, Take 1 tablet (75 mg total) by mouth daily., Disp: 90 tablet, Rfl: 3 .  Continuous Blood Gluc Receiver (FREESTYLE LIBRE 14 DAY READER) DEVI, 1 each by Does not apply route once for 1 dose., Disp: 1 Device, Rfl: 0 .  Continuous Blood Gluc Sensor (FREESTYLE LIBRE 14 DAY SENSOR) MISC, Inject 1 each into the skin every 14 (fourteen) days. Use as directed., Disp: 2 each, Rfl: 2 .  Empagliflozin-metFORMIN HCl (SYNJARDY) 12.10-998 MG TABS, Take 1 tablet by mouth 2 (two) times daily., Disp: 180 tablet, Rfl: 3 .  ferrous sulfate 325 (65 FE) MG tablet, Take 1 tablet (325 mg total) by mouth every other day. (Patient taking differently: Take 325 mg by mouth every morning. ), Disp: 90 tablet, Rfl: 1 .   glucose blood (FREESTYLE LITE) test strip, USE TO CHECK BLOOD SUGAR TWICE A DAY AND AS NEEDED, Disp: 300 each, Rfl: 3 .  insulin aspart (NOVOLOG FLEXPEN) 100 UNIT/ML FlexPen, INJECT 8-14 UNITS  3 times a day with meals, Disp: 45 mL, Rfl: 3 .  Insulin Degludec (TRESIBA FLEXTOUCH) 200 UNIT/ML SOPN, Inject 70 Units into the skin daily., Disp: 9 pen, Rfl: 3 .  Insulin Pen Needle (NOVOTWIST) 32G X 5 MM MISC, USE WITH INSULIN PENS UP TO 5 TIMES DAILY, Disp: 300 each, Rfl: 9 .  Lancets (FREESTYLE) lancets, Check BS BID and PRN. DX.E11.9, Disp: 300 each, Rfl: 3 .  nitroGLYCERIN (NITROSTAT) 0.4 MG SL tablet, Place 1 tablet (0.4 mg total) under the tongue every 5 (five) minutes as needed for chest pain., Disp: 25 tablet, Rfl: 3 .  olmesartan-hydrochlorothiazide (BENICAR HCT) 40-25 MG tablet, Take 1 tablet by mouth daily., Disp: 90 tablet, Rfl: 3 .  pantoprazole (PROTONIX) 40 MG tablet, Take 1 tablet (40 mg total) by mouth 2 (two) times daily after a meal., Disp: 180 tablet, Rfl: 2 .  REPATHA SURECLICK 469 MG/ML SOAJ, INJECT 140 MG UNDER THE SKIN EVERY 14 DAYS, Disp: 6 mL, Rfl: 4 .  rosuvastatin (CRESTOR) 20 MG tablet, Take 1 tablet (20 mg total) by mouth daily., Disp: 90 tablet, Rfl: 3  Past Medical History: Past Medical History:  Diagnosis Date  . Allergy   .  Arthritis   . CAD (coronary artery disease)   . 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 Cataract   . Diabetes mellitus   . Dysrhythmia    history of heart palpitations   . Gastric polyps 09/18/2017  . GERD (gastroesophageal reflux disease)   . Heart attack ([place])   . History of frequent urinary tract infections   . History of hiatal hernia   . Hyperlipemia   . Hypertension   . Obesity   . Pancreatitis     Tobacco Use: Social History   Tobacco Use  Smoking Status Former Smoker  . Packs/day: 1.00  . Years: 10.00  . Pack years: 10.00  . Types: Cigarettes  . Quit date: 04/06/1984  . Years since quitting:  34.8  Smokeless Tobacco Former Systems developer  . Types: Chew  . Quit date: 06/26/1978    Labs: Recent Review Flowsheet Data    Labs for ITP Cardiac and Pulmonary Rehab Latest Ref Rng & Units 02/19/2018 05/27/2018 07/09/2018 08/19/2018 12/12/2018   Cholestrol 100 - 199 mg/dL 78(L) - 184 112 166   LDLCALC 0 - 99 mg/dL 9 - 98 26 69   LDLDIRECT 0 - 99 mg/dL - - - - -   HDL >39 mg/dL 38(L) - 39(L) 46 39(L)   Trlycerides 0 - 149 mg/dL 155(H) - 237(H) 201(H) 289(H)   Hemoglobin A1c <7.0 % 7.8(H) - 7.1(H) - 8.2(H)   TCO2 22 - 32 mmol/L - 25 - - -      Capillary Blood Glucose: Lab Results  Component Value Date   GLUCAP 157 (H) 08/28/2018   GLUCAP 183 (H) 08/26/2018   GLUCAP 202 (H) 08/26/2018   GLUCAP 165 (H) 08/23/2018   GLUCAP 168 (H) 08/23/2018     Exercise Target Goals: Exercise Program Goal: Individual exercise prescription set using results from initial 6 min walk test and THRR while considering  patient's activity barriers and safety.   Exercise Prescription Goal: Starting with aerobic activity 30 plus minutes a day, 3 days per week for initial exercise prescription. Provide home exercise prescription and guidelines that participant acknowledges understanding prior to discharge.  Activity Barriers & Risk Stratification: Activity Barriers & Cardiac Risk Stratification - 08/13/18 1022      Activity Barriers & Cardiac Risk Stratification   Activity Barriers  Left Hip Replacement;Right Hip Replacement;Arthritis;Other (comment)    Comments  Right shoulder repair, left knee arthritis.    Cardiac Risk Stratification  Moderate       6 Minute Walk: 6 Minute Walk    Row Name 08/13/18 0827         6 Minute Walk   Phase  Initial     Distance  1252 feet     Walk Time  6 minutes     # of Rest Breaks  0     MPH  2.37     METS  2.47     RPE  11     Perceived Dyspnea   1     VO2 Peak  8.63     Symptoms  Yes (comment)     Comments  Patient c/o mild dyspnea.     Resting HR  78 bpm      Resting BP  132/72     Resting Oxygen Saturation   97 %     Exercise Oxygen Saturation  during 6 min walk  97 %     Max Ex. HR  110 bpm     Max Ex. BP  160/70     2 Minute Post BP  122/72        Oxygen Initial Assessment:   Oxygen Re-Evaluation:   Oxygen Discharge (Final Oxygen Re-Evaluation):   Initial Exercise Prescription: Initial Exercise Prescription - 08/13/18 1000      Date of Initial Exercise RX and Referring Provider   Date  08/13/18    Referring Provider  Minus Breeding, MD    Expected Discharge Date  11/15/18      Recumbant Bike   Level  2    Watts  15    Minutes  10    METs  2.45      NuStep   Level  2    SPM  85    Minutes  10    METs  2.4      Track   Laps  9    Minutes  10    METs  2.57      Prescription Details   Frequency (times per week)  3    Duration  Progress to 30 minutes of continuous aerobic without signs/symptoms of physical distress      Intensity   THRR 40-80% of Max Heartrate  58-117    Ratings of Perceived Exertion  11-13    Perceived Dyspnea  0-4      Progression   Progression  Continue to progress workloads to maintain intensity without signs/symptoms of physical distress.      Resistance Training   Training Prescription  Yes    Weight  3lbs    Reps  10-15       Perform Capillary Blood Glucose checks as needed.  Exercise Prescription Changes:  Exercise Prescription Changes    Row Name 08/23/18 1452 08/28/18 1510 09/02/18 1455 09/06/18 1458 01/06/19 1500     Response to Exercise   Blood Pressure (Admit)  148/82  132/74  122/60  138/68  114/66   Blood Pressure (Exercise)  148/70  154/72  138/78  148/78  122/72   Blood Pressure (Exit)  110/70  140/80  122/60  120/60  118/68   Heart Rate (Admit)  92 bpm  89 bpm  82 bpm  85 bpm  86 bpm   Heart Rate (Exercise)  108 bpm  109 bpm  102 bpm  103 bpm  92 bpm   Heart Rate (Exit)  98 bpm  85 bpm  81 bpm  85 bpm  86 bpm   Rating of Perceived Exertion (Exercise)  13  11  11   11  12    Symptoms  none  none  none  none  none   Comments  Switched from RB to AE b/c of lt knee discomfort.  -  -  -  -   Duration  Progress to 30 minutes of  aerobic without signs/symptoms of physical distress  Progress to 30 minutes of  aerobic without signs/symptoms of physical distress  Progress to 30 minutes of  aerobic without signs/symptoms of physical distress  Progress to 30 minutes of  aerobic without signs/symptoms of physical distress  Progress to 30 minutes of  aerobic without signs/symptoms of physical distress   Intensity  THRR unchanged  THRR unchanged  THRR unchanged  THRR unchanged  THRR unchanged     Progression   Progression  Continue to progress workloads to maintain intensity without signs/symptoms of physical distress.  Continue to progress workloads to maintain intensity without signs/symptoms of physical  distress.  Continue to progress workloads to maintain intensity without signs/symptoms of physical distress.  Continue to progress workloads to maintain intensity without signs/symptoms of physical distress.  Continue to progress workloads to maintain intensity without signs/symptoms of physical distress.   Average METs  2  2.3  2.4  2.3  2     Resistance Training   Training Prescription  Yes  No Relaxation day, no weights.  Yes  Yes  Yes   Weight  3lbs  -  4lbs  4lbs  7lbs   Reps  10-15  -  10-15  10-15  10-15   Time  10 Minutes  -  10 Minutes  10 Minutes  10 Minutes     Interval Training   Interval Training  No  No  No  No  No     Recumbant Bike   Level  -  -  -  -  -   Watts  -  -  -  -  -   Minutes  -  -  -  -  -   METs  -  -  -  -  -     NuStep   Level  2  3  3  3  3    SPM  85  85  85  85  85   Minutes  10  10  10  10  15    METs  1.7  2.1  2.1  2.5  2     Arm Ergometer   Level  1  2  2  2  2    Minutes  10  10  10  10  15    METs  1.7  1.9  1.8  1.8  -     Track   Laps  9  11  14  9   -   Minutes  10  10  10  10   -   METs  2.57  2.91  3.43  2.57  -      Home Exercise Plan   Plans to continue exercise at  -  Home (comment) Walking  Home (comment) Walking  Home (comment) Walking  Home (comment) Walking   Frequency  -  Add 4 additional days to program exercise sessions.  Add 4 additional days to program exercise sessions.  Add 4 additional days to program exercise sessions.  Add 4 additional days to program exercise sessions.   Initial Home Exercises Provided  -  08/28/18  08/28/18  08/28/18  08/28/18   Row Name 01/15/19 1354             Response to Exercise   Blood Pressure (Admit)  132/64       Blood Pressure (Exercise)  148/80       Blood Pressure (Exit)  118/60       Heart Rate (Admit)  90 bpm       Heart Rate (Exercise)  94 bpm       Heart Rate (Exit)  83 bpm       Rating of Perceived Exertion (Exercise)  12       Symptoms  none       Duration  Progress to 30 minutes of  aerobic without signs/symptoms of physical distress       Intensity  THRR unchanged         Progression   Progression  Continue to progress workloads to maintain intensity without signs/symptoms of physical distress.  Average METs  2.2         Resistance Training   Training Prescription  No Relaxation day, no weights.       Weight  -       Reps  -       Time  -         Interval Training   Interval Training  No         NuStep   Level  4       SPM  85       Minutes  15       METs  2.2         Arm Ergometer   Level  3       Minutes  15         Home Exercise Plan   Plans to continue exercise at  Home (comment) Walking       Frequency  Add 4 additional days to program exercise sessions.       Initial Home Exercises Provided  08/28/18          Exercise Comments:  Exercise Comments    Row Name 08/23/18 1548 08/28/18 1540 09/02/18 1516 01/06/19 1529 01/15/19 1415   Exercise Comments  Patient tolerated low intensity exercise well. Some left knee discomfort on recumbent bike, so pt moved to arm ergometer instead.  Reviewed home exercise  guidelines, METs, and goals with patient.  Reviewed METs and goals with patient.   Patient tolerated first session back at cardiac rehab well without symptoms.  Reviewed goals with patient. Pt is making good progress with exercise and weight loss.      Exercise Goals and Review:  Exercise Goals    Row Name 08/13/18 1023             Exercise Goals   Increase Physical Activity  Yes       Intervention  Provide advice, education, support and counseling about physical activity/exercise needs.;Develop an individualized exercise prescription for aerobic and resistive training based on initial evaluation findings, risk stratification, comorbidities and participant's personal goals.       Expected Outcomes  Short Term: Attend rehab on a regular basis to increase amount of physical activity.;Long Term: Add in home exercise to make exercise part of routine and to increase amount of physical activity.;Long Term: Exercising regularly at least 3-5 days a week.       Increase Strength and Stamina  Yes       Intervention  Provide advice, education, support and counseling about physical activity/exercise needs.;Develop an individualized exercise prescription for aerobic and resistive training based on initial evaluation findings, risk stratification, comorbidities and participant's personal goals.       Expected Outcomes  Short Term: Increase workloads from initial exercise prescription for resistance, speed, and METs.;Short Term: Perform resistance training exercises routinely during rehab and add in resistance training at home;Long Term: Improve cardiorespiratory fitness, muscular endurance and strength as measured by increased METs and functional capacity (6MWT)       Able to understand and use rate of perceived exertion (RPE) scale  Yes       Intervention  Provide education and explanation on how to use RPE scale       Expected Outcomes  Short Term: Able to use RPE daily in rehab to express subjective  intensity level;Long Term:  Able to use RPE to guide intensity level when exercising independently       Knowledge and understanding of  Target Heart Rate Range (THRR)  Yes       Intervention  Provide education and explanation of THRR including how the numbers were predicted and where they are located for reference       Expected Outcomes  Short Term: Able to state/look up THRR;Long Term: Able to use THRR to govern intensity when exercising independently;Short Term: Able to use daily as guideline for intensity in rehab       Able to check pulse independently  Yes       Intervention  Provide education and demonstration on how to check pulse in carotid and radial arteries.;Review the importance of being able to check your own pulse for safety during independent exercise       Expected Outcomes  Short Term: Able to explain why pulse checking is important during independent exercise;Long Term: Able to check pulse independently and accurately       Understanding of Exercise Prescription  Yes       Intervention  Provide education, explanation, and written materials on patient's individual exercise prescription       Expected Outcomes  Short Term: Able to explain program exercise prescription;Long Term: Able to explain home exercise prescription to exercise independently          Exercise Goals Re-Evaluation : Exercise Goals Re-Evaluation    Row Name 08/23/18 1548 08/28/18 1540 09/02/18 1516 09/11/18 1527 01/06/19 1525     Exercise Goal Re-Evaluation   Exercise Goals Review  Increase Physical Activity;Able to understand and use rate of perceived exertion (RPE) scale  Increase Physical Activity;Able to understand and use rate of perceived exertion (RPE) scale;Knowledge and understanding of Target Heart Rate Range (THRR);Able to check pulse independently;Understanding of Exercise Prescription  Increase Physical Activity;Able to understand and use rate of perceived exertion (RPE) scale;Knowledge and  understanding of Target Heart Rate Range (THRR);Able to check pulse independently;Understanding of Exercise Prescription;Increase Strength and Stamina  -  Increase Physical Activity;Able to understand and use rate of perceived exertion (RPE) scale;Knowledge and understanding of Target Heart Rate Range (THRR);Able to check pulse independently;Understanding of Exercise Prescription;Increase Strength and Stamina   Comments  Patient able to understand and use RPE scale appropriately. Patient switched from recumbent bike to arm ergometer because of left knee discomfort.  Reviewed home exercise guidelines with patient including THRR, RPE scale, and endpoints for exercise. Pt is walking 20 minutes daily, weather permitting, as his mode of home exercise. Pt is agreeable to increasing exercise duration from 20 to 30 minutes by adding a minute each session until reaching goal. pt attended pulse coutning class and is able to check his pulse independently.  Patient has been checking pulse with walking at home and knows his THRR. Pt feels that his knee is getting better and that his ability to move his knee has improved since he started exercise. Pt states that he's had some weight loss. Pt is stretching daily and walking ~20 minutes, 3 days/week.  Temporary department closure due to COVID-19.  Patient retruned to exercise on-site at cardiac rehab and tolerated exercise well. Patient states he's been exercising consistently since his last session at cardiac rehab. Pt is walking at home, and states he's doing the stretches and handweights, up to 6lbs, that he learned at cardiac rehab. Pt increased weights today at cardiac rehab. Pt has lost 6.4 kg since last time at cardiac rehab.   Expected Outcomes  Progress workloads as tolerated to help improve cardiorespiratory fitness.  Patient will increase walking duration at  home from 20 minutes daily to 30 minutes daily to meet recommended guidelines for exercise.  Continue to  progreess workloads to help improve strength and stamina and get back to doing ADLs.  -  Continue to progress workloads as tolerted to maintain fitness gains.   [place] Name 01/15/19 1415             Exercise Goal Re-Evaluation   Exercise Goals Review  Increase Physical Activity;Able to understand and use rate of perceived exertion (RPE) scale;Knowledge and understanding of Target Heart Rate Range (THRR);Able to check pulse independently;Understanding of Exercise Prescription;Increase Strength and Stamina       Comments  Patient is walking at least 3 days/week , weather permitting in addition to exercise at cardiac rehab. Pt states he achieves at least a mile walking per his pedomter. Pt states that he previously had knee pain that was limiting his exercise, but he feels this is better since he's been exercising in the program, and he is able to increase his workloads as a result. Pt is also pleased with his weight loss thus far.       Expected Outcomes  Progress workloads to help increase strength and endurance and be better able to perform ADLs.           Discharge Exercise Prescription (Final Exercise Prescription Changes): Exercise Prescription Changes - 01/15/19 1354      Response to Exercise   Blood Pressure (Admit)  132/64    Blood Pressure (Exercise)  148/80    Blood Pressure (Exit)  118/60    Heart Rate (Admit)  90 bpm    Heart Rate (Exercise)  94 bpm    Heart Rate (Exit)  83 bpm    Rating of Perceived Exertion (Exercise)  12    Symptoms  none    Duration  Progress to 30 minutes of  aerobic without signs/symptoms of physical distress    Intensity  THRR unchanged      Progression   Progression  Continue to progress workloads to maintain intensity without signs/symptoms of physical distress.    Average METs  2.2      Resistance Training   Training Prescription  No   Relaxation day, no weights.   Weight  --    Reps  --    Time  --      Interval Training   Interval Training   No      NuStep   Level  4    SPM  85    Minutes  15    METs  2.2      Arm Ergometer   Level  3    Minutes  15      Home Exercise Plan   Plans to continue exercise at  Home (comment)   Walking   Frequency  Add 4 additional days to program exercise sessions.    Initial Home Exercises Provided  08/28/18       Nutrition:  Target Goals: Understanding of nutrition guidelines, daily intake of sodium 1500mg , cholesterol 200mg , calories 30% from fat and 7% or less from saturated fats, daily to have 5 or more servings of fruits and vegetables.  Biometrics: Pre Biometrics - 08/13/18 0803      Pre Biometrics   Height  5' 7.5" (1.715 m)    Weight  104.6 kg    Waist Circumference  48 inches    Hip Circumference  45.5 inches    Waist to Hip Ratio  1.05 %  BMI (Calculated)  35.56    Triceps Skinfold  30 mm    % Body Fat  37.5 %    Grip Strength  26 kg    Flexibility  0 in    Single Leg Stand  13.06 seconds        Nutrition Therapy Plan and Nutrition Goals: Nutrition Therapy & Goals - 08/13/18 1538      Nutrition Therapy   Diet  heart healthy, carb modified      Personal Nutrition Goals   Nutrition Goal  Pt to identify and limit food sources of saturated fat, trans fat, refined carbohydrates and sodium    Personal Goal #2  Pt to identify food quantities necessary to achieve weight loss of 6-24 lb at graduation from cardiac rehab.     Personal Goal #3  Pt to build a healthy plate including vegetables, fruits, whole grains, and low-fat dairy products in a heart healthy meal plan.    Personal Goal #4  Pt to weigh and measure serving sizes      Intervention Plan   Intervention  Prescribe, educate and counsel regarding individualized specific dietary modifications aiming towards targeted core components such as weight, hypertension, lipid management, diabetes, heart failure and other comorbidities.    Expected Outcomes  Short Term Goal: Understand basic principles of dietary  content, such as calories, fat, sodium, cholesterol and nutrients.;Long Term Goal: Adherence to prescribed nutrition plan.       Nutrition Assessments:   Nutrition Goals Re-Evaluation: Nutrition Goals Re-Evaluation    Row Name 08/13/18 1538             Goals   Current Weight  230 lb 9.6 oz (104.6 kg)          Nutrition Goals Discharge (Final Nutrition Goals Re-Evaluation): Nutrition Goals Re-Evaluation - 08/13/18 1538      Goals   Current Weight  230 lb 9.6 oz (104.6 kg)       Psychosocial: Target Goals: Acknowledge presence or absence of significant depression and/or stress, maximize coping skills, provide positive support system. Participant is able to verbalize types and ability to use techniques and skills needed for reducing stress and depression.  Initial Review & Psychosocial Screening: Initial Psych Review & Screening - 08/13/18 1114      Initial Review   Current issues with  None Identified      Family Dynamics   Good Support System?  Yes   Jakyle has his wife, pastor and friends for support     Barriers   Psychosocial barriers to participate in program  There are no identifiable barriers or psychosocial needs.      Screening Interventions   Interventions  Encouraged to exercise       Quality of Life Scores: Quality of Life - 08/13/18 1028      Quality of Life   Select  Quality of Life      Quality of Life Scores   Health/Function Pre  25.53 %    Socioeconomic Pre  27.5 %    Psych/Spiritual Pre  29.14 %    Family Pre  30 %    GLOBAL Pre  27.34 %      Scores of 19 and below usually indicate a poorer quality of life in these areas.  A difference of  2-3 points is a clinically meaningful difference.  A difference of 2-3 points in the total score of the Quality of Life Index has been associated with significant improvement in overall  quality of life, self-image, physical symptoms, and general health in studies assessing change in quality of  life.  PHQ-9: Recent Review Flowsheet Data    Depression screen [facility] 2/9 01/06/2019 08/23/2018 07/23/2018 07/01/2018 03/27/2018   Decreased Interest 0 0 0 0 0   Down, Depressed, Hopeless 0 0 0 0 0   PHQ - 2 Score 0 0 0 0 0     Interpretation of Total Score  Total Score Depression Severity:  1-4 = Minimal depression, 5-9 = Mild depression, 10-14 = Moderate depression, 15-19 = Moderately severe depression, 20-27 = Severe depression   Psychosocial Evaluation and Intervention:   Psychosocial Re-Evaluation: Psychosocial Re-Evaluation    [place] Name 01/16/19 1623             Psychosocial Re-Evaluation   Current issues with  None Identified       Interventions  Encouraged to attend Cardiac Rehabilitation for the exercise       Continue Psychosocial Services   No Follow up required          Psychosocial Discharge (Final Psychosocial Re-Evaluation): Psychosocial Re-Evaluation - 01/16/19 1623      Psychosocial Re-Evaluation   Current issues with  None Identified    Interventions  Encouraged to attend Cardiac Rehabilitation for the exercise    Continue Psychosocial Services   No Follow up required       Vocational Rehabilitation: Provide vocational rehab assistance to qualifying candidates.   Vocational Rehab Evaluation & Intervention: Vocational Rehab - 08/13/18 1115      Initial Vocational Rehab Evaluation & Intervention   Assessment shows need for Vocational Rehabilitation  No   Johnny is retired and does not need vocational rehab at this time      Education: Education Goals: Education classes will be provided on a weekly basis, covering required topics. Participant will state understanding/return demonstration of topics presented.  Learning Barriers/Preferences: Learning Barriers/Preferences - 08/13/18 1047      Learning Barriers/Preferences   Learning Barriers  None    Learning Preferences  Written Material;Video;Skilled Demonstration;Pictoral       Education  Topics: Hypertension, Hypertension Reduction -Define heart disease and high blood pressure. Discus how high blood pressure affects the body and ways to reduce high blood pressure.   Exercise and Your Heart -Discuss why it is important to exercise, the FITT principles of exercise, normal and abnormal responses to exercise, and how to exercise safely.   Angina -Discuss definition of angina, causes of angina, treatment of angina, and how to decrease risk of having angina.   Cardiac Medications -Review what the following cardiac medications are used for, how they affect the body, and side effects that may occur when taking the medications.  Medications include Aspirin, Beta blockers, calcium channel blockers, ACE Inhibitors, angiotensin receptor blockers, diuretics, digoxin, and antihyperlipidemics.   Congestive Heart Failure -Discuss the definition of CHF, how to live with CHF, the signs and symptoms of CHF, and how keep track of weight and sodium intake.   Heart Disease and Intimacy -Discus the effect sexual activity has on the heart, how changes occur during intimacy as we age, and safety during sexual activity.   Smoking Cessation / COPD -Discuss different methods to quit smoking, the health benefits of quitting smoking, and the definition of COPD.   Nutrition I: Fats -Discuss the types of cholesterol, what cholesterol does to the heart, and how cholesterol levels can be controlled.   Nutrition II: Labels -Discuss the different components of food labels and how  to read food label   Heart Parts/Heart Disease and PAD -Discuss the anatomy of the heart, the pathway of blood circulation through the heart, and these are affected by heart disease.   Stress I: Signs and Symptoms -Discuss the causes of stress, how stress may lead to anxiety and depression, and ways to limit stress.   Stress II: Relaxation -Discuss different types of relaxation techniques to limit  stress.   Warning Signs of Stroke / TIA -Discuss definition of a stroke, what the signs and symptoms are of a stroke, and how to identify when someone is having stroke.   Knowledge Questionnaire Score: Knowledge Questionnaire Score - 08/13/18 1026      Knowledge Questionnaire Score   Pre Score  20/24       Core Components/Risk Factors/Patient Goals at Admission: Personal Goals and Risk Factors at Admission - 08/13/18 1035      Core Components/Risk Factors/Patient Goals on Admission    Weight Management  Yes;Obesity    Intervention  Weight Management: Develop a combined nutrition and exercise program designed to reach desired caloric intake, while maintaining appropriate intake of nutrient and fiber, sodium and fats, and appropriate energy expenditure required for the weight goal.;Weight Management: Provide education and appropriate resources to help participant work on and attain dietary goals.;Weight Management/Obesity: Establish reasonable short term and long term weight goals.;Obesity: Provide education and appropriate resources to help participant work on and attain dietary goals.    Admit Weight  230 lb 9.6 oz (104.6 kg)    Expected Outcomes  Weight Loss: Understanding of general recommendations for a balanced deficit meal plan, which promotes 1-2 lb weight loss per week and includes a negative energy balance of [phone] kcal/d;Short Term: Continue to assess and modify interventions until short term weight is achieved;Long Term: Adherence to nutrition and physical activity/exercise program aimed toward attainment of established weight goal;Understanding recommendations for meals to include 15-35% energy as protein, 25-35% energy from fat, 35-60% energy from carbohydrates, less than 200mg  of dietary cholesterol, 20-35 gm of total fiber daily;Understanding of distribution of calorie intake throughout the day with the consumption of 4-5 meals/snacks    Diabetes  Yes    Intervention  Provide  education about signs/symptoms and action to take for hypo/hyperglycemia.;Provide education about proper nutrition, including hydration, and aerobic/resistive exercise prescription along with prescribed medications to achieve blood glucose in normal ranges: Fasting glucose 65-99 mg/dL    Expected Outcomes  Short Term: Participant verbalizes understanding of the signs/symptoms and immediate care of hyper/hypoglycemia, proper foot care and importance of medication, aerobic/resistive exercise and nutrition plan for blood glucose control.;Long Term: Attainment of HbA1C < 7%.    Hypertension  Yes    Intervention  Provide education on lifestyle modifcations including regular physical activity/exercise, weight management, moderate sodium restriction and increased consumption of fresh fruit, vegetables, and low fat dairy, alcohol moderation, and smoking cessation.;Monitor prescription use compliance.    Expected Outcomes  Short Term: Continued assessment and intervention until BP is < 140/80mm HG in hypertensive participants. < 130/62mm HG in hypertensive participants with diabetes, heart failure or chronic kidney disease.;Long Term: Maintenance of blood pressure at goal levels.    Lipids  Yes    Intervention  Provide education and support for participant on nutrition & aerobic/resistive exercise along with prescribed medications to achieve LDL 70mg , HDL >40mg .    Expected Outcomes  Short Term: Participant states understanding of desired cholesterol values and is compliant with medications prescribed. Participant is following exercise prescription and nutrition  guidelines.;Long Term: Cholesterol controlled with medications as prescribed, with individualized exercise RX and with personalized nutrition plan. Value goals: LDL < 70mg , HDL > 40 mg.       Core Components/Risk Factors/Patient Goals Review:  Goals and Risk Factor Review    Row Name 01/16/19 1619             Core Components/Risk Factors/Patient  Goals Review   Personal Goals Review  Weight Management/Obesity;Hypertension;Lipids;Diabetes       Review  Keagon has done well with exercise since cardiac rehab has resumed. Dom has lost weight his vital signs have been stable.       Expected Outcomes  Raynard will continue to participate in phase 2 cardiac rehab for life style modification and risk factor reduction.          Core Components/Risk Factors/Patient Goals at Discharge (Final Review):  Goals and Risk Factor Review - 01/16/19 1619      Core Components/Risk Factors/Patient Goals Review   Personal Goals Review  Weight Management/Obesity;Hypertension;Lipids;Diabetes    Review  Kenon has done well with exercise since cardiac rehab has resumed. Pink has lost weight his vital signs have been stable.    Expected Outcomes  Adonias will continue to participate in phase 2 cardiac rehab for life style modification and risk factor reduction.       ITP Comments: ITP Comments    Row Name 08/13/18 0807 08/23/18 1459 01/16/19 1616       ITP Comments  Medical Director- Dr. Fransico Him, MD  30 Day ITP Review. Today was Isiaha first day of exercise  30 Day ITP Review. Patient is with good participation and attendance at phase 2 cardiac rehab.        Comments: See ITP comments.Barnet Pall, RN,BSN 01/16/2019 4:29 PM

## 2019-01-16 NOTE — Progress Notes (Signed)
Endocrinology Consult Note       01/16/2019, 5:33 PM   Subjective:    Patient ID: Marvin Frost, male    DOB: 1945-07-15.  Reshad Saab is being seen in consultation for management of currently uncontrolled symptomatic diabetes requested by  [name], MD.   Past Medical History:  Diagnosis Date  . Allergy   . Arthritis   . CAD (coronary artery disease)   . 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 Cataract   . Diabetes mellitus   . Dysrhythmia    history of heart palpitations   . Gastric polyps 09/18/2017  . GERD (gastroesophageal reflux disease)   . Heart attack ([place])   . History of frequent urinary tract infections   . History of hiatal hernia   . Hyperlipemia   . Hypertension   . Obesity   . Pancreatitis     Past Surgical History:  Procedure Laterality Date  . CARDIAC CATHETERIZATION     2004 and 1999- done by Dr Percival Spanish   . COLONOSCOPY  11/2003   diverticulosis, no polyps  . CORONARY STENT INTERVENTION N/A 05/28/2018   Procedure: CORONARY STENT INTERVENTION;  Surgeon: Sherren Mocha, MD;  Location: [facility];  Service: Cardiovascular;  Laterality: N/A;  . EYE SURGERY Bilateral    cateracts - dr Katy Fitch  . HYDROCELE EXCISION / REPAIR    . INTRAVASCULAR PRESSURE WIRE/FFR STUDY N/A 05/28/2018   Procedure: INTRAVASCULAR PRESSURE WIRE/FFR STUDY;  Surgeon: Sherren Mocha, MD;  Location: [facility];  Service: Cardiovascular;  Laterality: N/A;  . left calf surgery      related to trauma of being torn   . LEFT HEART CATH AND CORONARY ANGIOGRAPHY N/A 05/28/2018   Procedure: LEFT HEART CATH AND CORONARY ANGIOGRAPHY;  Surgeon: Sherren Mocha, MD;  Location: [facility];  Service: Cardiovascular;  Laterality: N/A;  . left index finger arthrioscopic surgery     . lymph node removed from right neck     . right shoulder surgery    . TOTAL HIP  ARTHROPLASTY Right 03/16/2015   Procedure: RIGHT TOTAL HIP ARTHROPLASTY ANTERIOR APPROACH;  Surgeon: Paralee Cancel, MD;  Location: [facility];  Service: Orthopedics;  Laterality: Right;  . TOTAL HIP ARTHROPLASTY Left 06/22/2015   Procedure: LEFT TOTAL HIP ARTHROPLASTY ANTERIOR APPROACH;  Surgeon: Paralee Cancel, MD;  Location: [facility];  Service: Orthopedics;  Laterality: Left;  . UPPER GASTROINTESTINAL ENDOSCOPY     hiatal hernia 2cm, mild gastritis - 2012, multiple hyperplastic and fundic gland gastric polyps 2019    Social History   Socioeconomic History  . Marital status: Married    Spouse name: Not on file  . Number of children: 1  . Years of education: Not on file  . Highest education level: Associate degree: occupational, Hotel manager, or vocational program  Occupational History  . Occupation: Best boy: [employer]    Comment: retired  Scientific laboratory technician  . Financial resource strain: Not hard at all  . Food insecurity    Worry: Never true    Inability: Never  true  . Transportation needs    Medical: No    Non-medical: No  Tobacco Use  . Smoking status: Former Smoker    Packs/day: 1.00    Years: 10.00    Pack years: 10.00    Types: Cigarettes    Quit date: 04/06/1984    Years since quitting: 34.8  . Smokeless tobacco: Former Systems developer    Types: [place] date: 06/26/1978  Substance and Sexual Activity  . Alcohol use: No  . Drug use: No  . Sexual activity: Yes  Lifestyle  . Physical activity    Days per week: 5 days    Minutes per session: 20 min  . Stress: Not at all  Relationships  . Social connections    Talks on phone: More than three times a week    Gets together: More than three times a week    Attends religious service: More than 4 times per year    Active member of club or organization: Yes    Attends meetings of clubs or organizations: More than 4 times per year    Relationship status: Married  Other Topics Concern  . Not on file  Social History  Narrative   Married   Retired from [employer]   1 child   Former but no current tobacco   No EtOH/drugs    Family History  Problem Relation Age of Onset  . Heart disease Mother   . Heart attack Mother   . Diabetes Brother   . Heart disease Brother   . Heart attack Brother 60  . Heart attack Father 24  . GI problems Sister        torn esophagus during endoscopy  . Healthy Daughter   . Colon polyps Neg Hx   . Esophageal cancer Neg Hx   . Rectal cancer Neg Hx   . Stomach cancer Neg Hx     Outpatient Encounter Medications as of 01/16/2019  Medication Sig  . aspirin EC 81 MG EC tablet Take 1 tablet (81 mg total) by mouth daily.  . carvedilol (COREG) 3.125 MG tablet Take 1 tablet (3.125 mg total) by mouth 2 (two) times daily.  . clopidogrel (PLAVIX) 75 MG tablet Take 1 tablet (75 mg total) by mouth daily.  . Empagliflozin-metFORMIN HCl (SYNJARDY) 12.10-998 MG TABS Take 1 tablet by mouth 2 (two) times daily.  . ferrous sulfate 325 (65 FE) MG tablet Take 1 tablet (325 mg total) by mouth every other day. (Patient taking differently: Take 325 mg by mouth every morning. )  . insulin aspart (NOVOLOG FLEXPEN) 100 UNIT/ML FlexPen INJECT 8-14 UNITS  3 times a day with meals  . Insulin Degludec (TRESIBA FLEXTOUCH) 200 UNIT/ML SOPN Inject 70 Units into the skin daily.  . nitroGLYCERIN (NITROSTAT) 0.4 MG SL tablet Place 1 tablet (0.4 mg total) under the tongue every 5 (five) minutes as needed for chest pain.  Marland Kitchen olmesartan-hydrochlorothiazide (BENICAR HCT) 40-25 MG tablet Take 1 tablet by mouth daily.  . pantoprazole (PROTONIX) 40 MG tablet Take 1 tablet (40 mg total) by mouth 2 (two) times daily after a meal.  . REPATHA SURECLICK 161 MG/ML SOAJ INJECT 140 MG UNDER THE SKIN EVERY 14 DAYS  . rosuvastatin (CRESTOR) 20 MG tablet Take 1 tablet (20 mg total) by mouth daily.  . [DISCONTINUED] insulin aspart (NOVOLOG FLEXPEN) 100 UNIT/ML FlexPen INJECT 16 TO 20 UNITS UNDER THE SKIN TWICE A DAY  .  [DISCONTINUED] Insulin Degludec (TRESIBA FLEXTOUCH) 200 UNIT/ML  SOPN Inject 60-70 Units into the skin daily.  Marland Kitchen amoxicillin (AMOXIL) 500 MG capsule Take 4 capsules (2,000 mg total) by mouth See admin instructions. Take four capsules (2000 mg) by mouth one hour prior to dental appointments  . Blood Glucose Monitoring Suppl (FREESTYLE LITE) DEVI Use to check BS bid. DX. E11.9  . cetirizine (ZYRTEC) 10 MG tablet Take 10 mg by mouth as needed for allergies or rhinitis.   . Continuous Blood Gluc Receiver (FREESTYLE LIBRE 14 DAY READER) DEVI 1 each by Does not apply route once for 1 dose.  . Continuous Blood Gluc Sensor (FREESTYLE LIBRE 14 DAY SENSOR) MISC Inject 1 each into the skin every 14 (fourteen) days. Use as directed.  Marland Kitchen glucose blood (FREESTYLE LITE) test strip USE TO CHECK BLOOD SUGAR TWICE A DAY AND AS NEEDED  . Insulin Pen Needle (NOVOTWIST) 32G X 5 MM MISC USE WITH INSULIN PENS UP TO 5 TIMES DAILY  . Lancets (FREESTYLE) lancets Check BS BID and PRN. DX.E11.9   No facility-administered encounter medications on file as of 01/16/2019.     ALLERGIES: Allergies  Allergen Reactions  . Brilinta [Ticagrelor]     Rash and SOB   . Liraglutide Other (See Comments)    Abdominal pain and enlarged pancreas on CT ? pancreatitis  . Baxinets [Cetylpyridinium-Benzocaine] Other (See Comments)    Unknown reaction  . Celebrex [Celecoxib] Nausea Only  . Toprol Xl [Metoprolol Succinate] Other (See Comments)    Unknown reaction - pt states it did not work  . Triplex Ad Other (See Comments)    Abdominal pain and enlarged pancreas on CT? pancreatitis  . Welchol [Colesevelam Hcl] Other (See Comments)    constipation  . Norvasc [Amlodipine Besylate] Rash  . Statins Other (See Comments)    muscle and joint pain if taken for long periods of time, takes Crestor every 2-3 days to avoid this reaction    VACCINATION STATUS: Immunization History  Administered Date(s) Administered  . Influenza Whole  02/24/2010  . Influenza, High Dose Seasonal PF 04/15/2015, 04/12/2016, 04/05/2017, 04/16/2018  . Influenza,inj,Quad PF,6+ Mos 03/26/2013, 03/30/2014  . Pneumococcal Conjugate-13 06/17/2013  . Pneumococcal Polysaccharide-23 02/24/2009  . Td 02/24/2006  . Tdap 04/27/2011  . Zoster 07/27/2006  . Zoster Recombinat (Shingrix) 07/22/2018    Diabetes He presents for his initial diabetic visit. He has type 2 diabetes mellitus. Onset time: He was diagnosed at approximate age of 33 years. His disease course has been fluctuating. There are no hypoglycemic associated symptoms. Pertinent negatives for hypoglycemia include no confusion, headaches, pallor or seizures. Associated symptoms include polydipsia and polyuria. Pertinent negatives for diabetes include no chest pain, no fatigue, no polyphagia and no weakness. There are no hypoglycemic complications. Symptoms are stable. Diabetic complications include heart disease. Risk factors for coronary artery disease include diabetes mellitus, dyslipidemia, family history, male sex, hypertension, sedentary lifestyle, tobacco exposure and obesity. Current diabetic treatment includes insulin injections and oral agent (monotherapy) (Is currently on Tresiba 70 units nightly, NovoLog 16-20 units 3 times a day, Synjardy 12.10/998 mg p.o. twice daily.). He is compliant with treatment most of the time. His weight is decreasing steadily. He is following a generally unhealthy diet. When asked about meal planning, he reported none. He participates in exercise intermittently. His breakfast blood glucose range is generally 140-180 mg/dl. His lunch blood glucose range is generally 140-180 mg/dl. His dinner blood glucose range is generally 140-180 mg/dl. His bedtime blood glucose range is generally 140-180 mg/dl. His overall blood glucose range  is 140-180 mg/dl. (He brings in log showing blood glucose average of 183, recent A1c of 8.2% on December 12, 2018.) An ACE inhibitor/angiotensin II  receptor blocker is being taken. Eye exam is current.  Hyperlipidemia This is a chronic problem. The current episode started more than 1 year ago. The problem is controlled. Exacerbating diseases include diabetes and obesity. Pertinent negatives include no chest pain, myalgias or shortness of breath. Current antihyperlipidemic treatment includes statins (He is on Crestor 20 mg p.o. 2 to 3 days a week, Repatha 140 mg every 4 weeks.). The current treatment provides moderate improvement of lipids. Risk factors for coronary artery disease include male sex, obesity, family history, dyslipidemia and diabetes mellitus.  Hypertension This is a chronic problem. The current episode started more than 1 year ago. The problem is controlled. Pertinent negatives include no chest pain, headaches, neck pain, palpitations or shortness of breath. Risk factors for coronary artery disease include dyslipidemia, diabetes mellitus, family history, male gender, obesity, sedentary lifestyle and smoking/tobacco exposure. Past treatments include angiotensin blockers. Hypertensive end-organ damage includes CAD/MI.     Review of Systems  Constitutional: Negative for chills, fatigue, fever and unexpected weight change.  HENT: Negative for dental problem, mouth sores and trouble swallowing.   Eyes: Negative for visual disturbance.  Respiratory: Negative for cough, choking, chest tightness, shortness of breath and wheezing.   Cardiovascular: Negative for chest pain, palpitations and leg swelling.  Gastrointestinal: Negative for abdominal distention, abdominal pain, constipation, diarrhea, nausea and vomiting.  Endocrine: Positive for polydipsia and polyuria. Negative for polyphagia.  Genitourinary: Negative for dysuria, flank pain, hematuria and urgency.  Musculoskeletal: Negative for back pain, gait problem, myalgias and neck pain.  Skin: Negative for pallor, rash and wound.  Neurological: Negative for seizures, syncope,  weakness, numbness and headaches.  Psychiatric/Behavioral: Negative for confusion and dysphoric mood.    Objective:    BP 132/71   Pulse 76   Ht 5\' 7"  (1.702 m)   Wt 214 lb (97.1 kg)   BMI 33.52 kg/m   Wt Readings from Last 3 Encounters:  01/16/19 214 lb (97.1 kg)  09/27/18 223 lb (101.2 kg)  08/20/18 228 lb 8 oz (103.6 kg)     Physical Exam Constitutional:      General: He is not in acute distress.    Appearance: He is well-developed.  HENT:     Head: Normocephalic and atraumatic.  Neck:     Musculoskeletal: Normal range of motion and neck supple.     Thyroid: No thyromegaly.     Trachea: No tracheal deviation.  Cardiovascular:     Rate and Rhythm: Normal rate.     Pulses:          Dorsalis pedis pulses are 1+ on the right side and 1+ on the left side.       Posterior tibial pulses are 1+ on the right side and 1+ on the left side.     Heart sounds: S1 normal and S2 normal. No murmur. No gallop.   Pulmonary:     Effort: Pulmonary effort is normal. No respiratory distress.     Breath sounds: No wheezing.  Abdominal:     General: There is no distension.     Tenderness: There is no abdominal tenderness. There is no guarding.  Musculoskeletal: Normal range of motion.     Right shoulder: He exhibits no swelling and no deformity.  Skin:    General: Skin is warm and dry.     Findings:  No rash.     Nails: There is no clubbing.   Neurological:     Mental Status: He is alert and oriented to person, place, and time.     Cranial Nerves: No cranial nerve deficit.     Sensory: No sensory deficit.     Gait: Gait normal.     Deep Tendon Reflexes: Reflexes are normal and symmetric.  Psychiatric:        Speech: Speech normal.        Behavior: Behavior normal. Behavior is cooperative.        Thought Content: Thought content normal.        Judgment: Judgment normal.      CMP     Component Value Date/Time   NA 137 12/12/2018 0804   K 4.2 12/12/2018 0804   CL 102  12/12/2018 0804   CO2 21 12/12/2018 0804   GLUCOSE 152 (H) 12/12/2018 0804   GLUCOSE 154 (H) 05/29/2018 0217   BUN 11 12/12/2018 0804   CREATININE 0.84 12/12/2018 0804   CREATININE 0.95 12/31/2012 0857   CALCIUM 8.9 12/12/2018 0804   PROT 6.3 12/12/2018 0804   ALBUMIN 4.0 12/12/2018 0804   AST 12 12/12/2018 0804   ALT 13 12/12/2018 0804   ALKPHOS 58 12/12/2018 0804   BILITOT 0.2 12/12/2018 0804   GFRNONAA 86 12/12/2018 0804   GFRNONAA 81 12/31/2012 0857   GFRAA 99 12/12/2018 0804   GFRAA >89 12/31/2012 0857    Diabetic Labs (most recent): Lab Results  Component Value Date   HGBA1C 8.2 (H) 12/12/2018   HGBA1C 7.1 (H) 07/09/2018   HGBA1C 7.8 (H) 02/19/2018     Lipid Panel ( most recent) Lipid Panel     Component Value Date/Time   CHOL 166 12/12/2018 0804   CHOL 157 12/31/2012 0857   TRIG 289 (H) 12/12/2018 0804   TRIG 271 (H) 02/17/2016 0824   TRIG 184 (H) 12/31/2012 0857   HDL 39 (L) 12/12/2018 0804   HDL 44 02/17/2016 0824   HDL 45 12/31/2012 0857   CHOLHDL 4.3 12/12/2018 0804   LDLCALC 69 12/12/2018 0804   LDLCALC 52 01/19/2014 1007   LDLCALC 75 12/31/2012 0857   LDLDIRECT 39 11/02/2016 0806      Lab Results  Component Value Date   TSH 2.290 09/03/2014     Assessment & Plan:   1. DM type 2 causing vascular disease ([place])  - Kadin Canipe has currently uncontrolled symptomatic type 2 DM since  73 years of age,  with most recent A1c of 8.2 %. Recent labs reviewed. - I had a long discussion with him about the progressive nature of diabetes and the pathology behind its complications. -his diabetes is complicated by coronary artery disease, obesity and he remains at a high risk for more acute and chronic complications which include CAD, CVA, CKD, retinopathy, and neuropathy. These are all discussed in detail with him.  - I have counseled him on diet management and weight loss, by adopting a carbohydrate restricted/protein rich diet. - he admits that there is a  room for improvement in his food and drink choices. - Suggestion is made for him to avoid simple carbohydrates  from his diet including Cakes, Sweet Desserts, Ice Cream, Soda (diet and regular), Sweet Tea, Candies, Chips, Cookies, Store Bought Juices, Alcohol in Excess of  1-2 drinks a day, Artificial Sweeteners,  Coffee Creamer, and "Sugar-free" Products. This will help patient to have more stable blood glucose profile and potentially avoid unintended  weight gain.  - I encouraged him to switch to  unprocessed or minimally processed complex starch and increased protein intake (animal or plant source), fruits, and vegetables.  - he is advised to stick to a routine mealtimes to eat 3 meals  a day and avoid unnecessary snacks ( to snack only to correct hypoglycemia).   - he will be scheduled with Jearld Fenton, RDN, CDE for individualized diabetes education.  - I have approached him with the following individualized plan to manage diabetes and patient agrees:   -Given his high risk profile, he will continue to benefit from intensive treatment with basal/bolus insulin.  -He is advised to continue Tresiba at 70 units daily at bedtime , and lower his prandial insulin NovoLog to 8 units 3 times a day with meals  for pre-meal BG readings of 90-150mg /dl, plus patient specific correction dose for unexpected hyperglycemia above 150mg /dl, associated with strict monitoring of glucose 4 times a day-before meals and at bedtime. - he is warned not to take insulin without proper monitoring per orders. - Adjustment parameters are given to him for hypo and hyperglycemia in writing. - he is encouraged to call clinic for blood glucose levels less than 70 or above 300 mg /dl. - he is advised to continue Synjardy 12.10/998 mg p.o. twice daily, therapeutically suitable for patient . -After he finishes his current supply of Synjardy, this medication will be discontinued.  He will be resumed on metformin.  - he will be  considered for incretin therapy as appropriate next visit.  - Patient specific target  A1c;  LDL, HDL, Triglycerides, and  Waist Circumference were discussed in detail.  2) Blood Pressure /Hypertension:  his blood pressure is  controlled to target.   he is advised to continue his current medications including Benicar/HCTZ 40/25 mg p.o. daily with breakfast . 3) Lipids/Hyperlipidemia:   Review of his recent lipid panel showed  controlled  LDL at 69 .  he  is advised to continue    Crestor 20 mg every other day, Repatha 140 mg subcutaneously every 4 weeks.  Side effects and precautions discussed with him.  4)  Weight/Diet:  Body mass index is 33.52 kg/m.  -   clearly complicating his diabetes care.  I discussed with him the fact that loss of 5 - 10% of his  current body weight will have the most impact on his diabetes management.  CDE Consult will be initiated . Exercise, and detailed carbohydrates information provided  -  detailed on discharge instructions.  5) Chronic Care/Health Maintenance:  -he  is on ACEI/ARB and Statin medications and  is encouraged to initiate and continue to follow up with Ophthalmology, Dentist,  Podiatrist at least yearly or according to recommendations, and advised to  stay away from smoking. I have recommended yearly flu vaccine and pneumonia vaccine at least every 5 years; moderate intensity exercise for up to 150 minutes weekly; and  sleep for at least 7 hours a day.  - he is  advised to maintain close follow up with [name], MD for primary care needs, as well as his other providers for optimal and coordinated care.  - Time spent with the patient: 45 minutes, of which >50% was spent in obtaining information about his symptoms, reviewing his previous labs/studies, evaluations, and treatments, counseling him about his currently uncontrolled, complicated type 2 diabetes; hyperlipidemia; hypertension;, and developing plans for long term treatment based on the latest  standards of care/guidelines.  Please refer to "  Patient Self Inventory" in the Media  tab for reviewed elements of pertinent patient history.  [name] participated in the discussions, expressed understanding, and voiced agreement with the above plans.  All questions were answered to his satisfaction. he is encouraged to contact clinic should he have any questions or concerns prior to his return visit.  Follow up plan: - Return in about 9 weeks (around 03/20/2019) for Follow up with Pre-visit Labs, Meter, and Logs.  Glade Lloyd, MD [facility] [address] [place], [place] [address] Phone: [phone]  Fax: [phone]    01/16/2019, 5:33 PM  This note was partially dictated with voice recognition software. Similar sounding words can be transcribed inadequately or may not  be corrected upon review.

## 2019-01-16 NOTE — Patient Instructions (Signed)

## 2019-01-17 ENCOUNTER — Encounter (HOSPITAL_COMMUNITY)
Admission: RE | Admit: 2019-01-17 | Discharge: 2019-01-17 | Disposition: A | Discharge status: Home / Self Care | Payer: Medicare Other | Source: Ambulatory Visit | Attending: Cardiology | Admitting: Cardiology

## 2019-01-17 DIAGNOSIS — Z955 Presence of coronary angioplasty implant and graft: Secondary | ICD-10-CM

## 2019-01-20 ENCOUNTER — Encounter (HOSPITAL_COMMUNITY)
Admission: RE | Admit: 2019-01-20 | Discharge: 2019-01-20 | Disposition: A | Discharge status: Home / Self Care | Payer: Medicare Other | Source: Ambulatory Visit | Attending: Cardiology | Admitting: Cardiology

## 2019-01-20 ENCOUNTER — Other Ambulatory Visit: Payer: Self-pay

## 2019-01-20 DIAGNOSIS — Z955 Presence of coronary angioplasty implant and graft: Secondary | ICD-10-CM | POA: Diagnosis not present

## 2019-01-22 ENCOUNTER — Encounter (HOSPITAL_COMMUNITY)
Admission: RE | Admit: 2019-01-22 | Discharge: 2019-01-22 | Disposition: A | Discharge status: Home / Self Care | Payer: Medicare Other | Source: Ambulatory Visit | Attending: Cardiology | Admitting: Cardiology

## 2019-01-22 ENCOUNTER — Other Ambulatory Visit: Payer: Self-pay

## 2019-01-22 DIAGNOSIS — Z955 Presence of coronary angioplasty implant and graft: Secondary | ICD-10-CM | POA: Diagnosis not present

## 2019-01-24 ENCOUNTER — Encounter (HOSPITAL_COMMUNITY)
Admission: RE | Admit: 2019-01-24 | Discharge: 2019-01-24 | Disposition: A | Discharge status: Home / Self Care | Payer: Medicare Other | Source: Ambulatory Visit | Attending: Cardiology | Admitting: Cardiology

## 2019-01-24 ENCOUNTER — Other Ambulatory Visit: Payer: Self-pay

## 2019-01-24 DIAGNOSIS — Z955 Presence of coronary angioplasty implant and graft: Secondary | ICD-10-CM | POA: Diagnosis not present

## 2019-01-27 ENCOUNTER — Encounter (HOSPITAL_COMMUNITY)
Admission: RE | Admit: 2019-01-27 | Discharge: 2019-01-27 | Disposition: A | Discharge status: Home / Self Care | Payer: Medicare Other | Source: Ambulatory Visit | Attending: Cardiology | Admitting: Cardiology

## 2019-01-27 ENCOUNTER — Other Ambulatory Visit: Payer: Self-pay

## 2019-01-27 ENCOUNTER — Telehealth: Payer: Self-pay | Admitting: "Endocrinology

## 2019-01-27 DIAGNOSIS — Z955 Presence of coronary angioplasty implant and graft: Secondary | ICD-10-CM | POA: Insufficient documentation

## 2019-01-27 NOTE — Telephone Encounter (Signed)
Patient left VM that his blood sugar readings are running around 50-60 in the mornings. I tried to reach patient to get some readings or if he could fax or upload to mychart & there was no answer.please advise.

## 2019-01-29 ENCOUNTER — Encounter (HOSPITAL_COMMUNITY)
Admission: RE | Admit: 2019-01-29 | Discharge: 2019-01-29 | Disposition: A | Discharge status: Home / Self Care | Payer: Medicare Other | Source: Ambulatory Visit | Attending: Cardiology | Admitting: Cardiology

## 2019-01-29 ENCOUNTER — Other Ambulatory Visit: Payer: Self-pay

## 2019-01-29 DIAGNOSIS — Z955 Presence of coronary angioplasty implant and graft: Secondary | ICD-10-CM | POA: Diagnosis not present

## 2019-01-31 ENCOUNTER — Other Ambulatory Visit: Payer: Self-pay

## 2019-01-31 ENCOUNTER — Encounter (HOSPITAL_COMMUNITY)
Admission: RE | Admit: 2019-01-31 | Discharge: 2019-01-31 | Disposition: A | Discharge status: Home / Self Care | Payer: Medicare Other | Source: Ambulatory Visit | Attending: Cardiology | Admitting: Cardiology

## 2019-01-31 DIAGNOSIS — Z955 Presence of coronary angioplasty implant and graft: Secondary | ICD-10-CM

## 2019-02-03 ENCOUNTER — Encounter (HOSPITAL_COMMUNITY)
Admission: RE | Admit: 2019-02-03 | Discharge: 2019-02-03 | Disposition: A | Discharge status: Home / Self Care | Payer: Medicare Other | Source: Ambulatory Visit | Attending: Cardiology | Admitting: Cardiology

## 2019-02-03 ENCOUNTER — Other Ambulatory Visit: Payer: Self-pay

## 2019-02-03 DIAGNOSIS — Z955 Presence of coronary angioplasty implant and graft: Secondary | ICD-10-CM | POA: Diagnosis not present

## 2019-02-05 ENCOUNTER — Encounter (HOSPITAL_COMMUNITY)
Admission: RE | Admit: 2019-02-05 | Discharge: 2019-02-05 | Disposition: A | Discharge status: Home / Self Care | Payer: Medicare Other | Source: Ambulatory Visit | Attending: Cardiology | Admitting: Cardiology

## 2019-02-05 ENCOUNTER — Other Ambulatory Visit: Payer: Self-pay

## 2019-02-05 DIAGNOSIS — Z955 Presence of coronary angioplasty implant and graft: Secondary | ICD-10-CM

## 2019-02-07 ENCOUNTER — Encounter (HOSPITAL_COMMUNITY)
Admission: RE | Admit: 2019-02-07 | Discharge: 2019-02-07 | Disposition: A | Discharge status: Home / Self Care | Payer: Medicare Other | Source: Ambulatory Visit | Attending: Cardiology | Admitting: Cardiology

## 2019-02-07 ENCOUNTER — Other Ambulatory Visit: Payer: Self-pay

## 2019-02-07 DIAGNOSIS — Z955 Presence of coronary angioplasty implant and graft: Secondary | ICD-10-CM

## 2019-02-10 ENCOUNTER — Other Ambulatory Visit: Payer: Self-pay

## 2019-02-10 ENCOUNTER — Encounter (HOSPITAL_COMMUNITY)
Admission: RE | Admit: 2019-02-10 | Discharge: 2019-02-10 | Disposition: A | Discharge status: Home / Self Care | Payer: Medicare Other | Source: Ambulatory Visit | Attending: Cardiology | Admitting: Cardiology

## 2019-02-10 DIAGNOSIS — Z955 Presence of coronary angioplasty implant and graft: Secondary | ICD-10-CM | POA: Diagnosis not present

## 2019-02-12 ENCOUNTER — Encounter (HOSPITAL_COMMUNITY)
Admission: RE | Admit: 2019-02-12 | Discharge: 2019-02-12 | Disposition: A | Discharge status: Home / Self Care | Payer: Medicare Other | Source: Ambulatory Visit | Attending: Cardiology | Admitting: Cardiology

## 2019-02-12 ENCOUNTER — Other Ambulatory Visit: Payer: Self-pay

## 2019-02-12 VITALS — Ht 67.5 in | Wt 213.4 lb

## 2019-02-12 DIAGNOSIS — Z955 Presence of coronary angioplasty implant and graft: Secondary | ICD-10-CM

## 2019-02-13 ENCOUNTER — Telehealth: Payer: Self-pay | Admitting: "Endocrinology

## 2019-02-13 NOTE — Telephone Encounter (Signed)
Marvin Frost with [facility] called and wanted to let you know that they shipped out the freestyle Roland and he should be getting it on 8/25.

## 2019-02-13 NOTE — Progress Notes (Signed)
Cardiac Individual Treatment Plan  Patient Details  Name: Marvin Frost MRN: [id number] Date of Birth: 06-08-1946 Referring Provider:     [place] from 08/13/2018 in [facility]  Referring Provider  Minus Breeding, MD      Initial Encounter Date:    CARDIAC REHAB PHASE II ORIENTATION from 08/13/2018 in [facility]  Date  08/13/18      Visit Diagnosis: 05/28/18 DES LAD  Patient's Home Medications on Admission:  Current Outpatient Medications:  .  amoxicillin (AMOXIL) 500 MG capsule, Take 4 capsules (2,000 mg total) by mouth See admin instructions. Take four capsules (2000 mg) by mouth one hour prior to dental appointments, Disp: 4 capsule, Rfl: 5 .  aspirin EC 81 MG EC tablet, Take 1 tablet (81 mg total) by mouth daily., Disp: , Rfl:  .  Blood Glucose Monitoring Suppl (FREESTYLE LITE) DEVI, Use to check BS bid. DX. E11.9, Disp: 1 each, Rfl: 0 .  carvedilol (COREG) 3.125 MG tablet, Take 1 tablet (3.125 mg total) by mouth 2 (two) times daily., Disp: 180 tablet, Rfl: 3 .  cetirizine (ZYRTEC) 10 MG tablet, Take 10 mg by mouth as needed for allergies or rhinitis. , Disp: , Rfl:  .  clopidogrel (PLAVIX) 75 MG tablet, Take 1 tablet (75 mg total) by mouth daily., Disp: 90 tablet, Rfl: 3 .  Continuous Blood Gluc Sensor (FREESTYLE LIBRE 14 DAY SENSOR) MISC, Inject 1 each into the skin every 14 (fourteen) days. Use as directed., Disp: 2 each, Rfl: 2 .  Empagliflozin-metFORMIN HCl (SYNJARDY) 12.10-998 MG TABS, Take 1 tablet by mouth 2 (two) times daily., Disp: 180 tablet, Rfl: 3 .  ferrous sulfate 325 (65 FE) MG tablet, Take 1 tablet (325 mg total) by mouth every other day. (Patient taking differently: Take 325 mg by mouth every morning. ), Disp: 90 tablet, Rfl: 1 .  glucose blood (FREESTYLE LITE) test strip, USE TO CHECK BLOOD SUGAR TWICE A DAY AND AS NEEDED, Disp: 300 each, Rfl: 3 .  insulin aspart (NOVOLOG  FLEXPEN) 100 UNIT/ML FlexPen, INJECT 8-14 UNITS  3 times a day with meals, Disp: 45 mL, Rfl: 3 .  Insulin Degludec (TRESIBA FLEXTOUCH) 200 UNIT/ML SOPN, Inject 70 Units into the skin daily., Disp: 9 pen, Rfl: 3 .  Insulin Pen Needle (NOVOTWIST) 32G X 5 MM MISC, USE WITH INSULIN PENS UP TO 5 TIMES DAILY, Disp: 300 each, Rfl: 9 .  Lancets (FREESTYLE) lancets, Check BS BID and PRN. DX.E11.9, Disp: 300 each, Rfl: 3 .  nitroGLYCERIN (NITROSTAT) 0.4 MG SL tablet, Place 1 tablet (0.4 mg total) under the tongue every 5 (five) minutes as needed for chest pain., Disp: 25 tablet, Rfl: 3 .  olmesartan-hydrochlorothiazide (BENICAR HCT) 40-25 MG tablet, Take 1 tablet by mouth daily., Disp: 90 tablet, Rfl: 3 .  pantoprazole (PROTONIX) 40 MG tablet, Take 1 tablet (40 mg total) by mouth 2 (two) times daily after a meal., Disp: 180 tablet, Rfl: 2 .  REPATHA SURECLICK 542 MG/ML SOAJ, INJECT 140 MG UNDER THE SKIN EVERY 14 DAYS, Disp: 6 mL, Rfl: 4 .  rosuvastatin (CRESTOR) 20 MG tablet, Take 1 tablet (20 mg total) by mouth daily., Disp: 90 tablet, Rfl: 3  Past Medical History: Past Medical History:  Diagnosis Date  . Allergy   . Arthritis   . CAD (coronary artery disease)   . 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  Cataract   . Diabetes mellitus   . Dysrhythmia    history of heart palpitations   . Gastric polyps 09/18/2017  . GERD (gastroesophageal reflux disease)   . Heart attack ([place])   . History of frequent urinary tract infections   . History of hiatal hernia   . Hyperlipemia   . Hypertension   . Obesity   . Pancreatitis     Tobacco Use: Social History   Tobacco Use  Smoking Status Former Smoker  . Packs/day: 1.00  . Years: 10.00  . Pack years: 10.00  . Types: Cigarettes  . Quit date: 04/06/1984  . Years since quitting: 34.8  Smokeless Tobacco Former Systems developer  . Types: Chew  . Quit date: 06/26/1978    Labs: Recent Review Flowsheet Data    Labs for ITP Cardiac and  Pulmonary Rehab Latest Ref Rng & Units 02/19/2018 05/27/2018 07/09/2018 08/19/2018 12/12/2018   Cholestrol 100 - 199 mg/dL 78(L) - 184 112 166   LDLCALC 0 - 99 mg/dL 9 - 98 26 69   LDLDIRECT 0 - 99 mg/dL - - - - -   HDL >39 mg/dL 38(L) - 39(L) 46 39(L)   Trlycerides 0 - 149 mg/dL 155(H) - 237(H) 201(H) 289(H)   Hemoglobin A1c <7.0 % 7.8(H) - 7.1(H) - 8.2(H)   TCO2 22 - 32 mmol/L - 25 - - -      Capillary Blood Glucose: Lab Results  Component Value Date   GLUCAP 157 (H) 08/28/2018   GLUCAP 183 (H) 08/26/2018   GLUCAP 202 (H) 08/26/2018   GLUCAP 165 (H) 08/23/2018   GLUCAP 168 (H) 08/23/2018     Exercise Target Goals: Exercise Program Goal: Individual exercise prescription set using results from initial 6 min walk test and THRR while considering  patient's activity barriers and safety.   Exercise Prescription Goal: Starting with aerobic activity 30 plus minutes a day, 3 days per week for initial exercise prescription. Provide home exercise prescription and guidelines that participant acknowledges understanding prior to discharge.  Activity Barriers & Risk Stratification:   6 Minute Walk: 6 Minute Walk    Row Name 02/12/19 1538         6 Minute Walk   Phase  Discharge     Distance  1420 feet     Distance % Change  13.42 %     Distance Feet Change  168 ft     Walk Time  6 minutes     # of Rest Breaks  0     MPH  2.6     METS  2.6     RPE  11     Perceived Dyspnea   0     VO2 Peak  9.4     Symptoms  No     Resting HR  81 bpm     Resting BP  138/70     Max Ex. HR  104 bpm     Max Ex. BP  140/66     2 Minute Post BP  132/76        Oxygen Initial Assessment:   Oxygen Re-Evaluation:   Oxygen Discharge (Final Oxygen Re-Evaluation):   Initial Exercise Prescription:   Perform Capillary Blood Glucose checks as needed.  Exercise Prescription Changes: Exercise Prescription Changes    Row Name 08/23/18 1452 08/28/18 1510 09/02/18 1455 09/06/18 1458 01/06/19 1500      Response to Exercise   Blood Pressure (Admit)  148/82  132/74  122/60  138/68  114/66   Blood Pressure (Exercise)  148/70  154/72  138/78  148/78  122/72   Blood Pressure (Exit)  110/70  140/80  122/60  120/60  118/68   Heart Rate (Admit)  92 bpm  89 bpm  82 bpm  85 bpm  86 bpm   Heart Rate (Exercise)  108 bpm  109 bpm  102 bpm  103 bpm  92 bpm   Heart Rate (Exit)  98 bpm  85 bpm  81 bpm  85 bpm  86 bpm   Rating of Perceived Exertion (Exercise)  _0 Symptoms  none  none  none  none  none   Comments  Switched from RB to AE b/c of lt knee discomfort.  -  -  -  -   Duration  Progress to 30 minutes of  aerobic without signs/symptoms of physical distress  Progress to 30 minutes of  aerobic without signs/symptoms of physical distress  Progress to 30 minutes of  aerobic without signs/symptoms of physical distress  Progress to 30 minutes of  aerobic without signs/symptoms of physical distress  Progress to 30 minutes of  aerobic without signs/symptoms of physical distress   Intensity  THRR unchanged  THRR unchanged  THRR unchanged  THRR unchanged  THRR unchanged     Progression   Progression  Continue to progress workloads to maintain intensity without signs/symptoms of physical distress.  Continue to progress workloads to maintain intensity without signs/symptoms of physical distress.  Continue to progress workloads to maintain intensity without signs/symptoms of physical distress.  Continue to progress workloads to maintain intensity without signs/symptoms of physical distress.  Continue to progress workloads to maintain intensity without signs/symptoms of physical distress.   Average METs  2  2.3  2.4  2.3  2     Resistance Training   Training Prescription  Yes  No Relaxation day, no weights.  Yes  Yes  Yes   Weight  3lbs  -  4lbs  4lbs  7lbs   Reps  10-15  -  10-15  10-15  10-15   Time  10 Minutes  -  10 Minutes  10 Minutes  10 Minutes     Interval Training   Interval  Training  No  No  No  No  No     Recumbant Bike   Level  -  -  -  -  -   Watts  -  -  -  -  -   Minutes  -  -  -  -  -   METs  -  -  -  -  -     NuStep   Level  _1 SPM  85  85  85  85  85   Minutes  _2 METs  1.7  2.1  2.1  2.5  2     Arm Ergometer   Level  _3 Minutes  _4 METs  1.7  1.9  1.8  1.8  -     Track   Laps  _5 -   Minutes  _6 -   METs  2.57  2.91  3.43  2.57  -     Home Exercise Plan   Plans to continue exercise at  -  Home (comment) Walking  Home (comment) Walking  Home (comment) Walking  Home (comment) Walking   Frequency  -  Add 4 additional days to program exercise sessions.  Add 4 additional days to program exercise sessions.  Add 4 additional days to program exercise sessions.  Add 4 additional days to program exercise sessions.   Initial Home Exercises Provided  -  08/28/18  08/28/18  08/28/18  08/28/18   Row Name 01/15/19 1354 02/12/19 1500           Response to Exercise   Blood Pressure (Admit)  132/64  138/70      Blood Pressure (Exercise)  148/80  140/66      Blood Pressure (Exit)  118/60  132/76      Heart Rate (Admit)  90 bpm  81 bpm      Heart Rate (Exercise)  94 bpm  104 bpm      Heart Rate (Exit)  83 bpm  89 bpm      Rating of Perceived Exertion (Exercise)  12  12      Symptoms  none  none      Duration  Progress to 30 minutes of  aerobic without signs/symptoms of physical distress  Progress to 30 minutes of  aerobic without signs/symptoms of physical distress      Intensity  THRR unchanged  THRR unchanged        Progression   Progression  Continue to progress workloads to maintain intensity without signs/symptoms of physical distress.  Continue to progress workloads to maintain intensity without signs/symptoms of physical distress.      Average METs  2.2  3        Resistance Training   Training Prescription  No Relaxation day, no weights.  No Relaxation  day, no weights.      Weight  -  -      Reps  -  -      Time  -  -        Interval Training   Interval Training  No  No        NuStep   Level  4  4      SPM  85  85      Minutes  15  15      METs  2.2  2.2        Arm Ergometer   Level  3  6      Minutes  15  15        Home Exercise Plan   Plans to continue exercise at  Home (comment) Walking  Home (comment) Walking      Frequency  Add 4 additional days to program exercise sessions.  Add 4 additional days to program exercise sessions.      Initial Home Exercises Provided  08/28/18  08/28/18         Exercise Comments: Exercise Comments    Row Name 08/23/18 1548 08/28/18 1540 09/02/18 1516 01/06/19 1529 01/15/19 1415   Exercise Comments  Patient tolerated low intensity exercise well. Some left knee discomfort on recumbent bike, so pt moved to arm ergometer instead.  Reviewed home exercise guidelines, METs, and goals with patient.  Reviewed METs and goals with patient.   Patient tolerated first session back at cardiac rehab well without symptoms.  Reviewed goals with  patient. Pt is making good progress with exercise and weight loss.   [place] Name 02/12/19 1549           Exercise Comments  Pt grauates CR program on his next visit. Pt will continue exercising at home for exercise after graduating.          Exercise Goals and Review:   Exercise Goals Re-Evaluation : Exercise Goals Re-Evaluation    Row Name 08/23/18 1548 08/28/18 1540 09/02/18 1516 09/11/18 1527 01/06/19 1525     Exercise Goal Re-Evaluation   Exercise Goals Review  Increase Physical Activity;Able to understand and use rate of perceived exertion (RPE) scale  Increase Physical Activity;Able to understand and use rate of perceived exertion (RPE) scale;Knowledge and understanding of Target Heart Rate Range (THRR);Able to check pulse independently;Understanding of Exercise Prescription  Increase Physical Activity;Able to understand and use rate of perceived exertion  (RPE) scale;Knowledge and understanding of Target Heart Rate Range (THRR);Able to check pulse independently;Understanding of Exercise Prescription;Increase Strength and Stamina  -  Increase Physical Activity;Able to understand and use rate of perceived exertion (RPE) scale;Knowledge and understanding of Target Heart Rate Range (THRR);Able to check pulse independently;Understanding of Exercise Prescription;Increase Strength and Stamina   Comments  Patient able to understand and use RPE scale appropriately. Patient switched from recumbent bike to arm ergometer because of left knee discomfort.  Reviewed home exercise guidelines with patient including THRR, RPE scale, and endpoints for exercise. Pt is walking 20 minutes daily, weather permitting, as his mode of home exercise. Pt is agreeable to increasing exercise duration from 20 to 30 minutes by adding a minute each session until reaching goal. pt attended pulse coutning class and is able to check his pulse independently.  Patient has been checking pulse with walking at home and knows his THRR. Pt feels that his knee is getting better and that his ability to move his knee has improved since he started exercise. Pt states that he's had some weight loss. Pt is stretching daily and walking ~20 minutes, 3 days/week.  Temporary department closure due to COVID-19.  Patient retruned to exercise on-site at cardiac rehab and tolerated exercise well. Patient states he's been exercising consistently since his last session at cardiac rehab. Pt is walking at home, and states he's doing the stretches and handweights, up to 6lbs, that he learned at cardiac rehab. Pt increased weights today at cardiac rehab. Pt has lost 6.4 kg since last time at cardiac rehab.   Expected Outcomes  Progress workloads as tolerated to help improve cardiorespiratory fitness.  Patient will increase walking duration at home from 20 minutes daily to 30 minutes daily to meet recommended guidelines for  exercise.  Continue to progreess workloads to help improve strength and stamina and get back to doing ADLs.  -  Continue to progress workloads as tolerted to maintain fitness gains.   [place] Name 01/15/19 1415 01/17/19 1439 02/12/19 1545         Exercise Goal Re-Evaluation   Exercise Goals Review  Increase Physical Activity;Able to understand and use rate of perceived exertion (RPE) scale;Knowledge and understanding of Target Heart Rate Range (THRR);Able to check pulse independently;Understanding of Exercise Prescription;Increase Strength and Stamina  Increase Physical Activity;Able to understand and use rate of perceived exertion (RPE) scale;Knowledge and understanding of Target Heart Rate Range (THRR);Able to check pulse independently;Understanding of Exercise Prescription;Increase Strength and Stamina  Increase Physical Activity;Understanding of Exercise Prescription;Increase Strength and Stamina;Knowledge and understanding of Target Heart Rate Range (THRR);Able to understand  and use rate of perceived exertion (RPE) scale;Able to check pulse independently     Comments  Patient is walking at least 3 days/week , weather permitting in addition to exercise at cardiac rehab. Pt states he achieves at least a mile walking per his pedomter. Pt states that he previously had knee pain that was limiting his exercise, but he feels this is better since he's been exercising in the program, and he is able to increase his workloads as a result. Pt is also pleased with his weight loss thus far.  Patient wanted to add to his personal goal for the program. In addition to weight loss, patient wants to strengthen his legs for better balance and improve cardio fitness. Pt feels that his breathing is better now than prior to his hospitalization.  Pt graduates from the [facility] program on his next visit. Pt tolerated exercise prescription well during the program and gradually increased levels of resistance. Pt ended the program with 3.0  MET level, lower %BF, increased grip strength, increased balance, and lower weight. Pt will continue exercising at home by using an elliptical and bike 20-30 minutes per day as well as walking 30 minutes. Pt also stated he has weights he will use for resistance training.     Expected Outcomes  Progress workloads to help increase strength and endurance and be better able to perform ADLs.  Progress workloads to help increase strength and endurance and be better able to perform ADLs.  Pt will continue exercising at home after graduation.         Discharge Exercise Prescription (Final Exercise Prescription Changes): Exercise Prescription Changes - 02/12/19 1500      Response to Exercise   Blood Pressure (Admit)  138/70    Blood Pressure (Exercise)  140/66    Blood Pressure (Exit)  132/76    Heart Rate (Admit)  81 bpm    Heart Rate (Exercise)  104 bpm    Heart Rate (Exit)  89 bpm    Rating of Perceived Exertion (Exercise)  12    Symptoms  none    Duration  Progress to 30 minutes of  aerobic without signs/symptoms of physical distress    Intensity  THRR unchanged      Progression   Progression  Continue to progress workloads to maintain intensity without signs/symptoms of physical distress.    Average METs  3      Resistance Training   Training Prescription  No   Relaxation day, no weights.     Interval Training   Interval Training  No      NuStep   Level  4    SPM  85    Minutes  15    METs  2.2      Arm Ergometer   Level  6    Minutes  15      Home Exercise Plan   Plans to continue exercise at  Home (comment)   Walking   Frequency  Add 4 additional days to program exercise sessions.    Initial Home Exercises Provided  08/28/18       Nutrition:  Target Goals: Understanding of nutrition guidelines, daily intake of sodium <1556m, cholesterol <2041m calories 30% from fat and 7% or less from saturated fats, daily to have 5 or more servings of fruits and  vegetables.  Biometrics:  PoBarbie Haggis 02/12/19 1540       Post  Biometrics   Height  5' 7.5" (1.715 m)  Weight  96.8 kg    Waist Circumference  44.5 inches    Hip Circumference  42 inches    Waist to Hip Ratio  1.06 %    BMI (Calculated)  32.91    Triceps Skinfold  25 mm    % Body Fat  34 %    Grip Strength  33 kg    Flexibility  0 in    Single Leg Stand  20.18 seconds       Nutrition Therapy Plan and Nutrition Goals:   Nutrition Assessments:   Nutrition Goals Re-Evaluation:   Nutrition Goals Discharge (Final Nutrition Goals Re-Evaluation):   Psychosocial: Target Goals: Acknowledge presence or absence of significant depression and/or stress, maximize coping skills, provide positive support system. Participant is able to verbalize types and ability to use techniques and skills needed for reducing stress and depression.  Initial Review & Psychosocial Screening:   Quality of Life Scores:  Scores of 19 and below usually indicate a poorer quality of life in these areas.  A difference of  2-3 points is a clinically meaningful difference.  A difference of 2-3 points in the total score of the Quality of Life Index has been associated with significant improvement in overall quality of life, self-image, physical symptoms, and general health in studies assessing change in quality of life.  PHQ-9: Recent Review Flowsheet Data    Depression screen [facility] 2/9 01/06/2019 08/23/2018 07/23/2018 07/01/2018 03/27/2018   Decreased Interest 0 0 0 0 0   Down, Depressed, Hopeless 0 0 0 0 0   PHQ - 2 Score 0 0 0 0 0     Interpretation of Total Score  Total Score Depression Severity:  1-4 = Minimal depression, 5-9 = Mild depression, 10-14 = Moderate depression, 15-19 = Moderately severe depression, 20-27 = Severe depression   Psychosocial Evaluation and Intervention:   Psychosocial Re-Evaluation: Psychosocial Re-Evaluation    Row Name 01/16/19 1623 02/13/19 1620            Psychosocial Re-Evaluation   Current issues with  None Identified  None Identified      Interventions  Encouraged to attend Cardiac Rehabilitation for the exercise  Encouraged to attend Cardiac Rehabilitation for the exercise      Continue Psychosocial Services   No Follow up required  -         Psychosocial Discharge (Final Psychosocial Re-Evaluation): Psychosocial Re-Evaluation - 02/13/19 1620      Psychosocial Re-Evaluation   Current issues with  None Identified    Interventions  Encouraged to attend Cardiac Rehabilitation for the exercise       Vocational Rehabilitation: Provide vocational rehab assistance to qualifying candidates.   Vocational Rehab Evaluation & Intervention:   Education: Education Goals: Education classes will be provided on a weekly basis, covering required topics. Participant will state understanding/return demonstration of topics presented.  Learning Barriers/Preferences:   Education Topics: Hypertension, Hypertension Reduction -Define heart disease and high blood pressure. Discus how high blood pressure affects the body and ways to reduce high blood pressure.   Exercise and Your Heart -Discuss why it is important to exercise, the FITT principles of exercise, normal and abnormal responses to exercise, and how to exercise safely.   Angina -Discuss definition of angina, causes of angina, treatment of angina, and how to decrease risk of having angina.   Cardiac Medications -Review what the following cardiac medications are used for, how they affect the body, and side effects that may occur when taking the medications.  Medications include Aspirin, Beta blockers, calcium channel blockers, ACE Inhibitors, angiotensin receptor blockers, diuretics, digoxin, and antihyperlipidemics.   Congestive Heart Failure -Discuss the definition of CHF, how to live with CHF, the signs and symptoms of CHF, and how keep track of weight and sodium intake.   Heart  Disease and Intimacy -Discus the effect sexual activity has on the heart, how changes occur during intimacy as we age, and safety during sexual activity.   Smoking Cessation / COPD -Discuss different methods to quit smoking, the health benefits of quitting smoking, and the definition of COPD.   Nutrition I: Fats -Discuss the types of cholesterol, what cholesterol does to the heart, and how cholesterol levels can be controlled.   Nutrition II: Labels -Discuss the different components of food labels and how to read food label   Heart Parts/Heart Disease and PAD -Discuss the anatomy of the heart, the pathway of blood circulation through the heart, and these are affected by heart disease.   Stress I: Signs and Symptoms -Discuss the causes of stress, how stress may lead to anxiety and depression, and ways to limit stress.   Stress II: Relaxation -Discuss different types of relaxation techniques to limit stress.   Warning Signs of Stroke / TIA -Discuss definition of a stroke, what the signs and symptoms are of a stroke, and how to identify when someone is having stroke.   Knowledge Questionnaire Score:   Core Components/Risk Factors/Patient Goals at Admission:   Core Components/Risk Factors/Patient Goals Review:  Goals and Risk Factor Review    Row Name 01/16/19 1619 02/13/19 1620           Core Components/Risk Factors/Patient Goals Review   Personal Goals Review  Weight Management/Obesity;Hypertension;Lipids;Diabetes  Weight Management/Obesity;Hypertension;Lipids;Diabetes      Review  Akshay has done well with exercise since cardiac rehab has resumed. [place] has lost weight his vital signs have been stable.  Reshard has done well with exercise since cardiac rehab has resumed. Ayvion has lost weight his vital signs have been stable. Maurico has lost 7 kg since febuary      Expected Outcomes  Kashtyn will continue to participate in phase 2 cardiac rehab for life style modification and  risk factor reduction.  Nainoa will continue to exercise upon graduation from phase 2 cardiac rehab and continue exercise on his own by walking and working outside.         Core Components/Risk Factors/Patient Goals at Discharge (Final Review):  Goals and Risk Factor Review - 02/13/19 1620      Core Components/Risk Factors/Patient Goals Review   Personal Goals Review  Weight Management/Obesity;Hypertension;Lipids;Diabetes    Review  Ankith has done well with exercise since cardiac rehab has resumed. Ra has lost weight his vital signs have been stable. Rc has lost 7 kg since febuary    Expected Outcomes  Inocente will continue to exercise upon graduation from phase 2 cardiac rehab and continue exercise on his own by walking and working outside.       ITP Comments: ITP Comments    Row Name 08/23/18 1459 01/16/19 1616 02/13/19 1618       ITP Comments  30 Day ITP Review. Today was [place] first day of exercise  30 Day ITP Review. Patient is with good participation and attendance at phase 2 cardiac rehab.  30 Day ITP Review. Patient is with good participation and attendance at phase 2 cardiac rehab. [employer].        Comments: 30 Day  ITP Review. See ITP comments.[place] Pall, RN,BSN 02/13/2019 4:25 PM

## 2019-02-14 ENCOUNTER — Other Ambulatory Visit: Payer: Self-pay

## 2019-02-14 ENCOUNTER — Encounter (HOSPITAL_COMMUNITY)
Admission: RE | Admit: 2019-02-14 | Discharge: 2019-02-14 | Disposition: A | Discharge status: Home / Self Care | Payer: Medicare Other | Source: Ambulatory Visit | Attending: Cardiology | Admitting: Cardiology

## 2019-02-14 DIAGNOSIS — Z955 Presence of coronary angioplasty implant and graft: Secondary | ICD-10-CM

## 2019-02-14 NOTE — Progress Notes (Signed)
Discharge Progress Report  Patient Details  Name: Marvin Frost MRN: [id number] Date of Birth: [birth year] Referring Provider:     [place] from 08/13/2018 in [place]  Referring Provider  Minus Breeding, MD       Number of Visits: 26  Reason for Discharge:  Patient reached a stable level of exercise. Patient independent in their exercise. Patient has met program and personal goals.  Smoking History:  Social History   Tobacco Use  Smoking Status Former Smoker  . Packs/day: 1.00  . Years: 10.00  . Pack years: 10.00  . Types: Cigarettes  . Quit date: 04/06/1984  . Years since quitting: 34.8  Smokeless Tobacco Former Systems developer  . Types: Chew  . Quit date: 06/26/1978    Diagnosis:  05/28/18 DES LAD  ADL [facility]:   Initial Exercise Prescription:   Discharge Exercise Prescription (Final Exercise Prescription Changes): Exercise Prescription Changes - 02/14/19 1500      Response to Exercise   Blood Pressure (Admit)  148/78    Blood Pressure (Exercise)  128/68    Blood Pressure (Exit)  122/68    Heart Rate (Admit)  81 bpm    Heart Rate (Exercise)  104 bpm    Heart Rate (Exit)  91 bpm    Rating of Perceived Exertion (Exercise)  12    Symptoms  none    Comments  Pt graduated CR program today.     Duration  Progress to 30 minutes of  aerobic without signs/symptoms of physical distress    Intensity  THRR unchanged      Progression   Progression  Continue to progress workloads to maintain intensity without signs/symptoms of physical distress.    Average METs  3      Resistance Training   Training Prescription  Yes   Relaxation day, no weights.   Weight  5 lbs.    Reps  10-15    Time  10 Minutes      Interval Training   Interval Training  No      NuStep   Level  6    SPM  85    Minutes  15    METs  2.6      Arm Ergometer   Level  6    Minutes  15      Home Exercise Plan   Plans to continue exercise at   Home (comment)   Walking   Frequency  Add 4 additional days to program exercise sessions.    Initial Home Exercises Provided  08/28/18       Functional Capacity: 6 Minute Walk    Row Name 02/12/19 1538         6 Minute Walk   Phase  Discharge     Distance  1420 feet     Distance % Change  13.42 %     Distance Feet Change  168 ft     Walk Time  6 minutes     # of Rest Breaks  0     MPH  2.6     METS  2.6     RPE  11     Perceived Dyspnea   0     VO2 Peak  9.4     Symptoms  No     Resting HR  81 bpm     Resting BP  138/70     Max Ex. HR  104 bpm  Max Ex. BP  140/66     2 Minute Post BP  132/76        Psychological, QOL, Others - Outcomes: PHQ 2/9: Depression screen [facility] 2/9 02/20/2019 01/06/2019 08/23/2018 07/23/2018 07/01/2018  Decreased Interest 0 0 0 0 0  Down, Depressed, Hopeless 0 0 0 0 0  PHQ - 2 Score 0 0 0 0 0  Altered sleeping - - - - -  Tired, decreased energy - - - - -  Change in appetite - - - - -  Feeling bad or failure about yourself  - - - - -  Trouble concentrating - - - - -  Moving slowly or fidgety/restless - - - - -  Suicidal thoughts - - - - -  PHQ-9 Score - - - - -  Difficult doing work/chores - - - - -  Some recent data might be hidden    Quality of Life: Quality of Life - 02/14/19 1432      Quality of Life Scores   Health/Function Pre  25.53 %    Health/Function Post  28.8 %    Health/Function % Change  12.81 %    Socioeconomic Pre  27.5 %    Socioeconomic Post  28.93 %    Socioeconomic % Change   5.2 %    Psych/Spiritual Pre  29.14 %    Psych/Spiritual Post  28.29 %    Psych/Spiritual % Change  -2.92 %    Family Pre  30 %    Family Post  30 %    Family % Change  0 %    GLOBAL Pre  27.34 %    GLOBAL Post  28.9 %    GLOBAL % Change  5.71 %       Personal Goals: Goals established at orientation with interventions provided to work toward goal.    Personal Goals Discharge: Goals and Risk Factor Review    Row Name 01/16/19  1619 02/13/19 1620           Core Components/Risk Factors/Patient Goals Review   Personal Goals Review  Weight Management/Obesity;Hypertension;Lipids;Diabetes  Weight Management/Obesity;Hypertension;Lipids;Diabetes      Review  Marvin Frost has done well with exercise since cardiac rehab has resumed. Marvin Frost has lost weight his vital signs have been stable.  Marvin Frost has done well with exercise since cardiac rehab has resumed. Marvin Frost has lost weight his vital signs have been stable. Marvin Frost has lost 7 kg since febuary      Expected Outcomes  Marvin Frost will continue to participate in phase 2 cardiac rehab for life style modification and risk factor reduction.  Marvin Frost will continue to exercise upon graduation from phase 2 cardiac rehab and continue exercise on his own by walking and working outside.         Exercise Goals and Review:   Exercise Goals Re-Evaluation: Exercise Goals Re-Evaluation    Row Name 08/28/18 1540 09/02/18 1516 09/11/18 1527 01/06/19 1525 01/15/19 1415     Exercise Goal Re-Evaluation   Exercise Goals Review  Increase Physical Activity;Able to understand and use rate of perceived exertion (RPE) scale;Knowledge and understanding of Target Heart Rate Range (THRR);Able to check pulse independently;Understanding of Exercise Prescription  Increase Physical Activity;Able to understand and use rate of perceived exertion (RPE) scale;Knowledge and understanding of Target Heart Rate Range (THRR);Able to check pulse independently;Understanding of Exercise Prescription;Increase Strength and Stamina  -  Increase Physical Activity;Able to understand and use rate of perceived exertion (RPE) scale;Knowledge and understanding of  Target Heart Rate Range (THRR);Able to check pulse independently;Understanding of Exercise Prescription;Increase Strength and Stamina  Increase Physical Activity;Able to understand and use rate of perceived exertion (RPE) scale;Knowledge and understanding of Target Heart Rate Range  (THRR);Able to check pulse independently;Understanding of Exercise Prescription;Increase Strength and Stamina   Comments  Reviewed home exercise guidelines with patient including THRR, RPE scale, and endpoints for exercise. Pt is walking 20 minutes daily, weather permitting, as his mode of home exercise. Pt is agreeable to increasing exercise duration from 20 to 30 minutes by adding a minute each session until reaching goal. pt attended pulse coutning class and is able to check his pulse independently.  Patient has been checking pulse with walking at home and knows his THRR. Pt feels that his knee is getting better and that his ability to move his knee has improved since he started exercise. Pt states that he's had some weight loss. Pt is stretching daily and walking ~20 minutes, 3 days/week.  Temporary department closure due to COVID-19.  Patient retruned to exercise on-site at cardiac rehab and tolerated exercise well. Patient states he's been exercising consistently since his last session at cardiac rehab. Pt is walking at home, and states he's doing the stretches and handweights, up to 6lbs, that he learned at cardiac rehab. Pt increased weights today at cardiac rehab. Pt has lost 6.4 kg since last time at cardiac rehab.  Patient is walking at least 3 days/week , weather permitting in addition to exercise at cardiac rehab. Pt states he achieves at least a mile walking per his pedomter. Pt states that he previously had knee pain that was limiting his exercise, but he feels this is better since he's been exercising in the program, and he is able to increase his workloads as a result. Pt is also pleased with his weight loss thus far.   Expected Outcomes  Patient will increase walking duration at home from 20 minutes daily to 30 minutes daily to meet recommended guidelines for exercise.  Continue to progreess workloads to help improve strength and stamina and get back to doing ADLs.  -  Continue to progress  workloads as tolerted to maintain fitness gains.  Progress workloads to help increase strength and endurance and be better able to perform ADLs.   Marvin Frost Name 01/17/19 1439 02/12/19 1545 02/14/19 1507         Exercise Goal Re-Evaluation   Exercise Goals Review  Increase Physical Activity;Able to understand and use rate of perceived exertion (RPE) scale;Knowledge and understanding of Target Heart Rate Range (THRR);Able to check pulse independently;Understanding of Exercise Prescription;Increase Strength and Stamina  Increase Physical Activity;Understanding of Exercise Prescription;Increase Strength and Stamina;Knowledge and understanding of Target Heart Rate Range (THRR);Able to understand and use rate of perceived exertion (RPE) scale;Able to check pulse independently  Increase Physical Activity;Understanding of Exercise Prescription;Increase Strength and Stamina;Knowledge and understanding of Target Heart Rate Range (THRR);Able to understand and use rate of perceived exertion (RPE) scale;Able to check pulse independently     Comments  Patient wanted to add to his personal goal for the program. In addition to weight loss, patient wants to strengthen his legs for better balance and improve cardio fitness. Pt feels that his breathing is better now than prior to his hospitalization.  Pt graduates from the [place] program on his next visit. Pt tolerated exercise prescription well during the program and gradually increased levels of resistance. Pt ended the program with 3.0 MET level, lower %BF, increased grip strength, increased  balance, and lower weight. Pt will continue exercising at home by using an elliptical and bike 20-30 minutes per day as well as walking 30 minutes. Pt also stated he has weights he will use for resistance training.  Pt completed CR program today. Pt tolerated exericse Rx well and ended with a MET level of 3.0. Pt plans to walk, use his elliptical, and do resistance training on his own at home. Pt  understands THRR, RPE scale, weather precuations, end points of exericse, NTG use, and warm up and cool down stretches.     Expected Outcomes  Progress workloads to help increase strength and endurance and be better able to perform ADLs.  Pt will continue exercising at home after graduation.  Will continue exercising at home.        Nutrition & Weight - Outcomes:  Post Biometrics - 02/12/19 1540       Post  Biometrics   Height  5' 7.5" (1.715 m)    Weight  213 lb 6.5 oz (96.8 kg)    Waist Circumference  44.5 inches    Hip Circumference  42 inches    Waist to Hip Ratio  1.06 %    BMI (Calculated)  32.91    Triceps Skinfold  25 mm    % Body Fat  34 %    Grip Strength  33 kg    Flexibility  0 in    Single Leg Stand  20.18 seconds       Nutrition:   Nutrition Discharge:   Education Questionnaire Score: Knowledge Questionnaire Score - 02/14/19 1430      Knowledge Questionnaire Score   Post Score  21/24       Goals reviewed with patient; copy given to patient.Pt graduated from cardiac rehab program on 02/14/19 with completion of 26 exercise sessions in Phase II. Pt maintained good attendance and progressed nicely during his participation in rehab as evidenced by increased MET level.   Medication list reconciled. Repeat  PHQ score-  0.  Pt has made significant lifestyle changes and should be commended for his success. Pt feels he has achieved his goals during cardiac rehab.   Pt plans to continue exercise by walking and working in his garden. Marvin Frost has lost 7 kg since he started the program. We are proud of Marvin Frost accomplishments, progress and weight loss in the program.Anneka Studer, RN,BSN 02/20/2019 1:40 PM

## 2019-02-26 DIAGNOSIS — C44311 Basal cell carcinoma of skin of nose: Secondary | ICD-10-CM | POA: Diagnosis not present

## 2019-02-27 ENCOUNTER — Telehealth: Payer: Self-pay | Admitting: "Endocrinology

## 2019-02-27 MED ORDER — TRESIBA FLEXTOUCH 200 UNIT/ML ~~LOC~~ SOPN: 40.0000 [IU] | PEN_INJECTOR | Freq: Every day | SUBCUTANEOUS | 3 refills | Status: DC

## 2019-02-27 NOTE — Telephone Encounter (Signed)
Pt.notified

## 2019-02-27 NOTE — Telephone Encounter (Signed)
Patient has been having low readings. These readings are all at breakfast.  These were on the sensor/meter. 8/30 - 70 in the morning 8/31 - 63 in the morning 9/1 -  60 in the morning 9/2 - 93 in the morning 9/3 -  76 this morning, he checked it with the strip and it was 107.

## 2019-02-27 NOTE — Telephone Encounter (Signed)
Advise to lower Tresiba to 40 units qhs.

## 2019-02-28 ENCOUNTER — Encounter (HOSPITAL_COMMUNITY): Payer: Self-pay

## 2019-02-28 ENCOUNTER — Encounter (HOSPITAL_COMMUNITY)
Admission: RE | Admit: 2019-02-28 | Discharge: 2019-02-28 | Disposition: A | Discharge status: Home / Self Care | Payer: Medicare Other | Source: Ambulatory Visit | Attending: Cardiology | Admitting: Cardiology

## 2019-02-28 DIAGNOSIS — Z955 Presence of coronary angioplasty implant and graft: Secondary | ICD-10-CM | POA: Insufficient documentation

## 2019-03-13 ENCOUNTER — Other Ambulatory Visit: Payer: Self-pay | Admitting: Family Medicine

## 2019-03-14 ENCOUNTER — Other Ambulatory Visit: Payer: Medicare Other

## 2019-03-14 ENCOUNTER — Other Ambulatory Visit: Payer: Self-pay

## 2019-03-14 ENCOUNTER — Telehealth: Payer: Self-pay

## 2019-03-14 DIAGNOSIS — E1159 Type 2 diabetes mellitus with other circulatory complications: Secondary | ICD-10-CM | POA: Diagnosis not present

## 2019-03-14 DIAGNOSIS — E1169 Type 2 diabetes mellitus with other specified complication: Secondary | ICD-10-CM | POA: Diagnosis not present

## 2019-03-14 DIAGNOSIS — R7989 Other specified abnormal findings of blood chemistry: Secondary | ICD-10-CM

## 2019-03-14 DIAGNOSIS — E782 Mixed hyperlipidemia: Secondary | ICD-10-CM | POA: Diagnosis not present

## 2019-03-14 DIAGNOSIS — K219 Gastro-esophageal reflux disease without esophagitis: Secondary | ICD-10-CM

## 2019-03-14 DIAGNOSIS — E785 Hyperlipidemia, unspecified: Secondary | ICD-10-CM | POA: Diagnosis not present

## 2019-03-14 DIAGNOSIS — E119 Type 2 diabetes mellitus without complications: Secondary | ICD-10-CM

## 2019-03-14 NOTE — Telephone Encounter (Signed)
Marvin Frost, CMA  

## 2019-03-14 NOTE — Telephone Encounter (Addendum)
Prior Auth for [employer]-APPROVED 02/18/19-03/19/20  PA case# [id number]  Form filled out that [employer] faxed over and is laying on [place] desk to be signed when he returns to the office on Tuesday 03/18/19

## 2019-03-15 LAB — COMPREHENSIVE METABOLIC PANEL
ALT: 12 IU/L (ref 0–44)
AST: 13 IU/L (ref 0–40)
Albumin/Globulin Ratio: 1.9 (ref 1.2–2.2)
Albumin: 4.3 g/dL (ref 3.7–4.7)
Alkaline Phosphatase: 57 IU/L (ref 39–117)
BUN/Creatinine Ratio: 13 (ref 10–24)
BUN: 13 mg/dL (ref 8–27)
Bilirubin Total: 0.2 mg/dL (ref 0.0–1.2)
CO2: 23 mmol/L (ref 20–29)
Calcium: 9.8 mg/dL (ref 8.6–10.2)
Chloride: 97 mmol/L (ref 96–106)
Creatinine, Ser: 1.01 mg/dL (ref 0.76–1.27)
GFR calc Af Amer: 84 mL/min/{1.73_m2} (ref >59)
GFR calc non Af Amer: 72 mL/min/{1.73_m2} (ref >59)
Globulin, Total: 2.3 g/dL (ref 1.5–4.5)
Glucose: 169 mg/dL — ABNORMAL HIGH (ref 65–99)
Potassium: 4.3 mmol/L (ref 3.5–5.2)
Sodium: 136 mmol/L (ref 134–144)
Total Protein: 6.6 g/dL (ref 6.0–8.5)

## 2019-03-15 LAB — HEMOGLOBIN A1C
Est. average glucose Bld gHb Est-mCnc: 160 mg/dL
Hgb A1c MFr Bld: 7.2 % — ABNORMAL HIGH (ref 4.8–5.6)

## 2019-03-16 ENCOUNTER — Other Ambulatory Visit: Payer: Self-pay | Admitting: Family Medicine

## 2019-03-18 ENCOUNTER — Telehealth: Payer: Self-pay | Admitting: Family Medicine

## 2019-03-18 NOTE — Telephone Encounter (Signed)
Waiting on lab response

## 2019-03-18 NOTE — Telephone Encounter (Signed)
Please contact the patient Let him know that they look good. A1c is 7.2. Chol. Should be back soon.

## 2019-03-18 NOTE — Telephone Encounter (Signed)
Aware of results. 

## 2019-03-18 NOTE — Telephone Encounter (Signed)
I ordered them. He had them drawn. Ask lab why not done.

## 2019-03-18 NOTE — Telephone Encounter (Signed)
Ask lab staff to check with their home lab to find outWhy are they not done and when we can expect them.

## 2019-03-19 ENCOUNTER — Telehealth: Payer: Self-pay | Admitting: Family Medicine

## 2019-03-19 LAB — LIPID PANEL
Chol/HDL Ratio: 4.1 ratio (ref 0.0–5.0)
Cholesterol, Total: 162 mg/dL (ref 100–199)
HDL: 40 mg/dL
LDL Chol Calc (NIH): 72 mg/dL (ref 0–99)
Triglycerides: 311 mg/dL — ABNORMAL HIGH (ref 0–149)
VLDL Cholesterol Cal: 50 mg/dL — ABNORMAL HIGH (ref 5–40)

## 2019-03-19 NOTE — Telephone Encounter (Signed)
Left message with lab results

## 2019-03-20 ENCOUNTER — Ambulatory Visit (INDEPENDENT_AMBULATORY_CARE_PROVIDER_SITE_OTHER): Payer: Medicare Other | Admitting: "Endocrinology

## 2019-03-20 ENCOUNTER — Ambulatory Visit: Payer: Medicare Other | Admitting: "Endocrinology

## 2019-03-20 ENCOUNTER — Encounter: Payer: Self-pay | Admitting: "Endocrinology

## 2019-03-20 ENCOUNTER — Other Ambulatory Visit: Payer: Self-pay

## 2019-03-20 DIAGNOSIS — E782 Mixed hyperlipidemia: Secondary | ICD-10-CM | POA: Diagnosis not present

## 2019-03-20 DIAGNOSIS — I10 Essential (primary) hypertension: Secondary | ICD-10-CM | POA: Diagnosis not present

## 2019-03-20 DIAGNOSIS — E1159 Type 2 diabetes mellitus with other circulatory complications: Secondary | ICD-10-CM

## 2019-03-20 DIAGNOSIS — I2510 Atherosclerotic heart disease of native coronary artery without angina pectoris: Secondary | ICD-10-CM | POA: Diagnosis not present

## 2019-03-20 MED ORDER — METFORMIN HCL 1000 MG PO TABS: 1000.0000 mg | ORAL_TABLET | Freq: Two times a day (BID) | ORAL | 1 refills | Status: DC

## 2019-03-20 NOTE — Progress Notes (Signed)
03/20/2019, 1:30 PM                                                    Endocrinology Telehealth Visit Follow up Note -During COVID -19 Pandemic  This visit type was conducted due to national recommendations for restrictions regarding the COVID-19 Pandemic  in an effort to limit this patient's exposure and mitigate transmission of the corona virus.  Due to his co-morbid illnesses, Marvin Frost is at  moderate to high risk for complications without adequate follow up.  This format is felt to be most appropriate for him at this time.  I connected with this patient on 03/20/2019   by telephone and verified that I am speaking with the correct person using two identifiers. Marvin Frost, Mar 28, 1946. he has verbally consented to this visit. All issues noted in this document were discussed and addressed. The format was not optimal for physical exam.    Subjective:    Patient ID: Marvin Frost, male    DOB: September 22, 1945.  Marvin Frost is being engaged in telehealth via telephone follow-up after he was seen in consultation for management of currently uncontrolled symptomatic diabetes requested by  [name], MD.   Past Medical History:  Diagnosis Date  . Allergy   . Arthritis   . CAD (coronary artery disease)   . 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 Cataract   . Diabetes mellitus   . Dysrhythmia    history of heart palpitations   . Gastric polyps 09/18/2017  . GERD (gastroesophageal reflux disease)   . Heart attack ([place])   . History of frequent urinary tract infections   . History of hiatal hernia   . Hyperlipemia   . Hypertension   . Obesity   . Pancreatitis     Past Surgical History:  Procedure Laterality Date  . CARDIAC CATHETERIZATION     2004 and 1999- done by Dr Percival Spanish   . COLONOSCOPY  11/2003   diverticulosis, no polyps  . CORONARY STENT INTERVENTION N/A 05/28/2018    Procedure: CORONARY STENT INTERVENTION;  Surgeon: Sherren Mocha, MD;  Location: [facility];  Service: Cardiovascular;  Laterality: N/A;  . EYE SURGERY Bilateral    cateracts - dr Katy Fitch  . HYDROCELE EXCISION / REPAIR    . INTRAVASCULAR PRESSURE WIRE/FFR STUDY N/A 05/28/2018   Procedure: INTRAVASCULAR PRESSURE WIRE/FFR STUDY;  Surgeon: Sherren Mocha, MD;  Location: [facility];  Service: Cardiovascular;  Laterality: N/A;  . left calf surgery      related to trauma of being torn   . LEFT HEART CATH AND CORONARY ANGIOGRAPHY N/A 05/28/2018   Procedure: LEFT HEART CATH AND CORONARY ANGIOGRAPHY;  Surgeon: Sherren Mocha, MD;  Location: [facility];  Service: Cardiovascular;  Laterality: N/A;  . left index finger arthrioscopic surgery     . lymph node removed from right neck     .  right shoulder surgery    . TOTAL HIP ARTHROPLASTY Right 03/16/2015   Procedure: RIGHT TOTAL HIP ARTHROPLASTY ANTERIOR APPROACH;  Surgeon: Paralee Cancel, MD;  Location: [facility];  Service: Orthopedics;  Laterality: Right;  . TOTAL HIP ARTHROPLASTY Left 06/22/2015   Procedure: LEFT TOTAL HIP ARTHROPLASTY ANTERIOR APPROACH;  Surgeon: Paralee Cancel, MD;  Location: [facility];  Service: Orthopedics;  Laterality: Left;  . UPPER GASTROINTESTINAL ENDOSCOPY     hiatal hernia 2cm, mild gastritis - 2012, multiple hyperplastic and fundic gland gastric polyps 2019    Social History   Socioeconomic History  . Marital status: Married    Spouse name: Not on file  . Number of children: 1  . Years of education: Not on file  . Highest education level: Associate degree: occupational, Hotel manager, or vocational program  Occupational History  . Occupation: Best boy: [employer]    Comment: retired  Scientific laboratory technician  . Financial resource strain: Not hard at all  . Food insecurity    Worry: Never true    Inability: Never true  . Transportation needs    Medical: No    Non-medical: No  Tobacco Use  .  Smoking status: Former Smoker    Packs/day: 1.00    Years: 10.00    Pack years: 10.00    Types: Cigarettes    Quit date: 04/06/1984    Years since quitting: 34.9  . Smokeless tobacco: Former Systems developer    Types: [place] date: 06/26/1978  Substance and Sexual Activity  . Alcohol use: No  . Drug use: No  . Sexual activity: Yes  Lifestyle  . Physical activity    Days per week: 5 days    Minutes per session: 20 min  . Stress: Not at all  Relationships  . Social connections    Talks on phone: More than three times a week    Gets together: More than three times a week    Attends religious service: More than 4 times per year    Active member of club or organization: Yes    Attends meetings of clubs or organizations: More than 4 times per year    Relationship status: Married  Other Topics Concern  . Not on file  Social History Narrative   Married   Retired from [employer]   1 child   Former but no current tobacco   No EtOH/drugs    Family History  Problem Relation Age of Onset  . Heart disease Mother   . Heart attack Mother   . Diabetes Brother   . Heart disease Brother   . Heart attack Brother 32  . Heart attack Father 74  . GI problems Sister        torn esophagus during endoscopy  . Healthy Daughter   . Colon polyps Neg Hx   . Esophageal cancer Neg Hx   . Rectal cancer Neg Hx   . Stomach cancer Neg Hx     Outpatient Encounter Medications as of 03/20/2019  Medication Sig  . amoxicillin (AMOXIL) 500 MG capsule Take 4 capsules (2,000 mg total) by mouth See admin instructions. Take four capsules (2000 mg) by mouth one hour prior to dental appointments  . aspirin EC 81 MG EC tablet Take 1 tablet (81 mg total) by mouth daily.  . Blood Glucose Monitoring Suppl (FREESTYLE LITE) DEVI Use to check BS bid. DX. E11.9  . carvedilol (COREG) 3.125 MG tablet Take 1 tablet (3.125 mg  total) by mouth 2 (two) times daily.  . cetirizine (ZYRTEC) 10 MG tablet Take 10 mg by mouth as  needed for allergies or rhinitis.   Marland Kitchen clopidogrel (PLAVIX) 75 MG tablet Take 1 tablet (75 mg total) by mouth daily.  . Continuous Blood Gluc Sensor (FREESTYLE LIBRE 14 DAY SENSOR) MISC Inject 1 each into the skin every 14 (fourteen) days. Use as directed.  . ferrous sulfate 325 (65 FE) MG tablet Take 1 tablet (325 mg total) by mouth every other day. (Patient taking differently: Take 325 mg by mouth every morning. )  . glucose blood (FREESTYLE LITE) test strip USE TO CHECK BLOOD SUGAR TWICE A DAY AND AS NEEDED  . insulin aspart (NOVOLOG FLEXPEN) 100 UNIT/ML FlexPen INJECT 8-14 UNITS  3 times a day with meals  . Insulin Degludec (TRESIBA FLEXTOUCH) 200 UNIT/ML SOPN Inject 40 Units into the skin daily.  . Insulin Pen Needle (NOVOTWIST) 32G X 5 MM MISC USE WITH INSULIN PENS UP TO 5 TIMES DAILY Dx E11.59  . Lancets (FREESTYLE) lancets Check BS BID and PRN. DX.E11.9  . metFORMIN (GLUCOPHAGE) 1000 MG tablet Take 1 tablet (1,000 mg total) by mouth 2 (two) times daily with a meal.  . nitroGLYCERIN (NITROSTAT) 0.4 MG SL tablet Place 1 tablet (0.4 mg total) under the tongue every 5 (five) minutes as needed for chest pain.  Marland Kitchen olmesartan-hydrochlorothiazide (BENICAR HCT) 40-25 MG tablet Take 1 tablet by mouth daily.  . pantoprazole (PROTONIX) 40 MG tablet Take 1 tablet (40 mg total) by mouth 2 (two) times daily after a meal.  . REPATHA SURECLICK [id number] MG/ML SOAJ INJECT 140 MG UNDER THE SKIN EVERY 14 DAYS  . rosuvastatin (CRESTOR) 20 MG tablet Take 1 tablet (20 mg total) by mouth daily.  . [DISCONTINUED] Empagliflozin-metFORMIN HCl (SYNJARDY) 12.10-998 MG TABS Take 1 tablet by mouth 2 (two) times daily.   No facility-administered encounter medications on file as of 03/20/2019.     ALLERGIES: Allergies  Allergen Reactions  . Brilinta [Ticagrelor]     Rash and SOB   . Liraglutide Other (See Comments)    Abdominal pain and enlarged pancreas on CT ? pancreatitis  . Baxinets [Cetylpyridinium-Benzocaine] Other  (See Comments)    Unknown reaction  . Celebrex [Celecoxib] Nausea Only  . Toprol Xl [Metoprolol Succinate] Other (See Comments)    Unknown reaction - pt states it did not work  . Triplex Ad Other (See Comments)    Abdominal pain and enlarged pancreas on CT? pancreatitis  . Welchol [Colesevelam Hcl] Other (See Comments)    constipation  . Norvasc [Amlodipine Besylate] Rash  . Statins Other (See Comments)    muscle and joint pain if taken for long periods of time, takes Crestor every 2-3 days to avoid this reaction    VACCINATION STATUS: Immunization History  Administered Date(s) Administered  . Influenza Whole 02/24/2010  . Influenza, High Dose Seasonal PF 04/15/2015, 04/12/2016, 04/05/2017, 04/16/2018  . Influenza,inj,Quad PF,6+ Mos 03/26/2013, 03/30/2014  . Pneumococcal Conjugate-13 06/17/2013  . Pneumococcal Polysaccharide-23 02/24/2009  . Td 02/24/2006  . Tdap 04/27/2011  . Zoster 07/27/2006  . Zoster Recombinat (Shingrix) 07/22/2018    Diabetes He presents for his follow-up diabetic visit. He has type 2 diabetes mellitus. Onset time: He was diagnosed at approximate age of 58 years. His disease course has been improving. There are no hypoglycemic associated symptoms. Pertinent negatives for hypoglycemia include no confusion, headaches, pallor or seizures. Pertinent negatives for diabetes include no chest pain, no fatigue, no polydipsia,  no polyphagia, no polyuria and no weakness. There are no hypoglycemic complications. Symptoms are improving. Diabetic complications include heart disease. Risk factors for coronary artery disease include diabetes mellitus, dyslipidemia, family history, male sex, hypertension, sedentary lifestyle, tobacco exposure and obesity. Current diabetic treatment includes insulin injections and oral agent (monotherapy) (Is currently on Tresiba 70 units nightly, NovoLog 16-20 units 3 times a day, Synjardy 12.10/998 mg p.o. twice daily.). He is compliant with  treatment most of the time. His weight is fluctuating minimally. He is following a generally unhealthy diet. When asked about meal planning, he reported none. He participates in exercise intermittently. His breakfast blood glucose range is generally 130-140 mg/dl. His lunch blood glucose range is generally 140-180 mg/dl. His dinner blood glucose range is generally 140-180 mg/dl. His bedtime blood glucose range is generally 140-180 mg/dl. His overall blood glucose range is 140-180 mg/dl. An ACE inhibitor/angiotensin II receptor blocker is being taken. Eye exam is current.  Hyperlipidemia This is a chronic problem. The current episode started more than 1 year ago. The problem is controlled. Exacerbating diseases include diabetes and obesity. Pertinent negatives include no chest pain, myalgias or shortness of breath. Current antihyperlipidemic treatment includes statins (He is on Crestor 20 mg p.o. 2 to 3 days a week, Repatha 140 mg every 4 weeks.). The current treatment provides moderate improvement of lipids. Risk factors for coronary artery disease include male sex, obesity, family history, dyslipidemia and diabetes mellitus.  Hypertension This is a chronic problem. The current episode started more than 1 year ago. The problem is controlled. Pertinent negatives include no chest pain, headaches, neck pain, palpitations or shortness of breath. Risk factors for coronary artery disease include dyslipidemia, diabetes mellitus, family history, male gender, obesity, sedentary lifestyle and smoking/tobacco exposure. Past treatments include angiotensin blockers. Hypertensive end-organ damage includes CAD/MI.     Objective:    There were no vitals taken for this visit.  Wt Readings from Last 3 Encounters:  02/12/19 213 lb 6.5 oz (96.8 kg)  01/16/19 214 lb (97.1 kg)  09/27/18 223 lb (101.2 kg)     CMP     Component Value Date/Time   NA 136 03/14/2019 0815   K 4.3 03/14/2019 0815   CL 97 03/14/2019 0815    CO2 23 03/14/2019 0815   GLUCOSE 169 (H) 03/14/2019 0815   GLUCOSE 154 (H) 05/29/2018 0217   BUN 13 03/14/2019 0815   CREATININE 1.01 03/14/2019 0815   CREATININE 0.95 12/31/2012 0857   CALCIUM 9.8 03/14/2019 0815   PROT 6.6 03/14/2019 0815   ALBUMIN 4.3 03/14/2019 0815   AST 13 03/14/2019 0815   ALT 12 03/14/2019 0815   ALKPHOS 57 03/14/2019 0815   BILITOT 0.2 03/14/2019 0815   GFRNONAA 72 03/14/2019 0815   GFRNONAA 81 12/31/2012 0857   GFRAA 84 03/14/2019 0815   GFRAA >89 12/31/2012 0857    Diabetic Labs (most recent): Lab Results  Component Value Date   HGBA1C 7.2 (H) 03/14/2019   HGBA1C 8.2 (H) 12/12/2018   HGBA1C 7.1 (H) 07/09/2018     Lipid Panel ( most recent) Lipid Panel     Component Value Date/Time   CHOL 162 03/14/2019 1159   CHOL 157 12/31/2012 0857   TRIG 311 (H) 03/14/2019 1159   TRIG 271 (H) 02/17/2016 0824   TRIG 184 (H) 12/31/2012 0857   HDL 40 03/14/2019 1159   HDL 44 02/17/2016 0824   HDL 45 12/31/2012 0857   CHOLHDL 4.1 03/14/2019 1159   LDLCALC 69  12/12/2018 0804   LDLCALC 52 01/19/2014 1007   LDLCALC 75 12/31/2012 0857   LDLDIRECT 39 11/02/2016 0806      Lab Results  Component Value Date   TSH 2.290 09/03/2014     Assessment & Plan:   1. DM type 2 causing vascular disease ([place])  - Mostyn Bueter has currently uncontrolled symptomatic type 2 DM since  74 years of age. -He reports consistently on target glycemic profile using his CGM, and A1c of 7.2% improving from 8.2%.  -I have reviewed his recent labs with him. - I had a long discussion with him about the progressive nature of diabetes and the pathology behind its complications. -his diabetes is complicated by coronary artery disease, obesity and he remains at a high risk for more acute and chronic complications which include CAD, CVA, CKD, retinopathy, and neuropathy. These are all discussed in detail with him.  - I have counseled him on diet management and weight loss, by adopting a  carbohydrate restricted/protein rich diet.  - he  admits there is a room for improvement in his diet and drink choices. -  Suggestion is made for him to avoid simple carbohydrates  from his diet including Cakes, Sweet Desserts / Pastries, Ice Cream, Soda (diet and regular), Sweet Tea, Candies, Chips, Cookies, Sweet Pastries,  Store Bought Juices, Alcohol in Excess of  1-2 drinks a day, Artificial Sweeteners, Coffee Creamer, and "Sugar-free" Products. This will help patient to have stable blood glucose profile and potentially avoid unintended weight gain.   - I encouraged him to switch to  unprocessed or minimally processed complex starch and increased protein intake (animal or plant source), fruits, and vegetables.  - he is advised to stick to a routine mealtimes to eat 3 meals  a day and avoid unnecessary snacks ( to snack only to correct hypoglycemia).   - he is  scheduled with Jearld Fenton, RDN, CDE for individualized diabetes education.  - I have approached him with the following individualized plan to manage diabetes and patient agrees:   -Given his high risk profile, he will continue to benefit from intensive treatment with basal/bolus insulin.  -Due to tight fasting glycemic profile, he was advised to lower his [place] over the phone.  He is advised to continue Tresiba 40 units daily at bedtime, continue  NovoLog  8 units 3 times a day with meals  for pre-meal BG readings of 90-150mg /dl, plus patient specific correction dose for unexpected hyperglycemia above 150mg /dl, associated with strict monitoring of glucose 4 times a day-before meals and at bedtime. - he is warned not to take insulin without proper monitoring per orders. - Adjustment parameters are given to him for hypo and hyperglycemia in writing. - he is encouraged to call clinic for blood glucose levels less than 70 or above 200 mg /dl. -His Synjardy will be discontinued.  I discussed and initiated metformin 1000 mg p.o. twice  daily-after breakfast and supper.   - he will be considered for incretin therapy as appropriate next visit.  - Patient specific target  A1c;  LDL, HDL, Triglycerides, and  Waist Circumference were discussed in detail.  2) Blood Pressure /Hypertension:     he is advised to continue his current medications including Benicar/HCTZ 40/25 mg p.o. daily with breakfast .  3) Lipids/Hyperlipidemia:   Review of his recent lipid panel showed  controlled  LDL at 69 .  he  is advised to continue    Crestor 20 mg every other day, Repatha  140 mg subcutaneously every 4 weeks.  Side effects and precautions discussed with him.  4)  Weight/Diet: His BMI is 13-   he is a candidate for weight loss.  I discussed with him the fact that loss of 5 - 10% of his  current body weight will have the most impact on his diabetes management.  CDE Consult will be initiated . Exercise, and detailed carbohydrates information provided  -  detailed on discharge instructions.  5) Chronic Care/Health Maintenance:  -he  is on ACEI/ARB and Statin medications and  is encouraged to initiate and continue to follow up with Ophthalmology, Dentist,  Podiatrist at least yearly or according to recommendations, and advised to  stay away from smoking. I have recommended yearly flu vaccine and pneumonia vaccine at least every 5 years; moderate intensity exercise for up to 150 minutes weekly; and  sleep for at least 7 hours a day.  - he is  advised to maintain close follow up with [name], MD for primary care needs, as well as his other providers for optimal and coordinated care.  - Patient Care Time Today:  25 min, of which >50% was spent in  counseling and the rest reviewing his  current and  previous labs/studies, previous treatments, his blood glucose readings, and medications' doses and developing a plan for long-term care based on the latest recommendations for standards of care.   Marvin Frost participated in the discussions, expressed  understanding, and voiced agreement with the above plans.  All questions were answered to his satisfaction. he is encouraged to contact clinic should he have any questions or concerns prior to his return visit.   Follow up plan: - Return in about 4 months (around 07/20/2019) for Bring Meter and Logs- A1c in Office, Include 8 log sheets.  Glade Lloyd, MD [facility] [address] [place], [place] [address] Phone: [phone]  Fax: [phone]    03/20/2019, 1:30 PM  This note was partially dictated with voice recognition software. Similar sounding words can be transcribed inadequately or may not  be corrected upon review.

## 2019-03-25 ENCOUNTER — Other Ambulatory Visit: Payer: Self-pay

## 2019-03-25 DIAGNOSIS — E119 Type 2 diabetes mellitus without complications: Secondary | ICD-10-CM | POA: Diagnosis not present

## 2019-03-25 DIAGNOSIS — Z961 Presence of intraocular lens: Secondary | ICD-10-CM | POA: Diagnosis not present

## 2019-03-25 LAB — HM DIABETES EYE EXAM

## 2019-03-26 ENCOUNTER — Ambulatory Visit (INDEPENDENT_AMBULATORY_CARE_PROVIDER_SITE_OTHER): Payer: Medicare Other

## 2019-03-26 ENCOUNTER — Ambulatory Visit (INDEPENDENT_AMBULATORY_CARE_PROVIDER_SITE_OTHER): Payer: Medicare Other | Admitting: Family Medicine

## 2019-03-26 ENCOUNTER — Encounter: Payer: Self-pay | Admitting: Family Medicine

## 2019-03-26 DIAGNOSIS — E1159 Type 2 diabetes mellitus with other circulatory complications: Secondary | ICD-10-CM

## 2019-03-26 DIAGNOSIS — E782 Mixed hyperlipidemia: Secondary | ICD-10-CM | POA: Diagnosis not present

## 2019-03-26 DIAGNOSIS — I2510 Atherosclerotic heart disease of native coronary artery without angina pectoris: Secondary | ICD-10-CM | POA: Diagnosis not present

## 2019-03-26 DIAGNOSIS — Z9861 Coronary angioplasty status: Secondary | ICD-10-CM

## 2019-03-26 DIAGNOSIS — I10 Essential (primary) hypertension: Secondary | ICD-10-CM | POA: Diagnosis not present

## 2019-03-26 DIAGNOSIS — Z23 Encounter for immunization: Secondary | ICD-10-CM | POA: Diagnosis not present

## 2019-03-26 MED ORDER — PANTOPRAZOLE SODIUM 40 MG PO TBEC: 40.0000 mg | DELAYED_RELEASE_TABLET | Freq: Two times a day (BID) | ORAL | 3 refills | Status: DC

## 2019-03-26 MED ORDER — ROSUVASTATIN CALCIUM 40 MG PO TABS: 40.0000 mg | ORAL_TABLET | ORAL | 3 refills | Status: DC

## 2019-03-26 MED ORDER — OLMESARTAN MEDOXOMIL-HCTZ 40-25 MG PO TABS: 1.0000 | ORAL_TABLET | Freq: Every day | ORAL | 3 refills | Status: DC

## 2019-03-26 NOTE — Progress Notes (Signed)
Subjective:    Patient ID: Marvin Frost, male    DOB: 03/26/1946, 73 y.o.   MRN: [id number]   HPI: Marvin Frost is a 73 y.o. male presenting for presents forFollow-up of diabetes. Patient checks blood sugar at home.   Now followed by Dr. Dorris Fetch. A1c improving with regimen and diet counseling provided there.  Patient denies symptoms such as polyuria, polydipsia, excessive hunger, nausea No significant hypoglycemic spells noted. Medications reviewed. Pt reports taking them regularly without complication/adverse reaction being reported today.    BP 139/81 HR 82 at home this morning.   Doing virtual cardiac rehab from home regularly. Says he can follow lower fat diet to reduce triglyceride. Taking repatha due to poor tolerance of crestor. He does take the crestor q 3 days.    in for follow-up of elevated cholesterol. Doing well without complaints on current medication. Denies side effects of statin including myalgia and arthralgia and nausea. Currently no chest pain, shortness of breath or other cardiovascular related symptoms noted.   presents for  follow-up of hypertension. Patient has no history of headache chest pain or shortness of breath or recent cough. Patient also denies symptoms of TIA such as focal numbness or weakness. Patient denies side effects from medication. States taking it regularly.  Depression screen [facility] 2/9 02/20/2019 01/06/2019 08/23/2018 07/23/2018 07/01/2018  Decreased Interest 0 0 0 0 0  Down, Depressed, Hopeless 0 0 0 0 0  PHQ - 2 Score 0 0 0 0 0  Altered sleeping - - - - -  Tired, decreased energy - - - - -  Change in appetite - - - - -  Feeling bad or failure about yourself  - - - - -  Trouble concentrating - - - - -  Moving slowly or fidgety/restless - - - - -  Suicidal thoughts - - - - -  PHQ-9 Score - - - - -  Difficult doing work/chores - - - - -  Some recent data might be hidden     Relevant past medical, surgical, family and social history reviewed and  updated as indicated.  Interim medical history since our last visit reviewed. Allergies and medications reviewed and updated.  ROS:  Review of Systems  Constitutional: Negative.   HENT: Negative.   Eyes: Negative for visual disturbance.  Respiratory: Negative for cough and shortness of breath.   Cardiovascular: Negative for chest pain and leg swelling.  Gastrointestinal: Negative for abdominal pain, diarrhea, nausea and vomiting.  Genitourinary: Negative for difficulty urinating.  Musculoskeletal: Negative for arthralgias and myalgias.  Skin: Negative for rash.  Neurological: Negative for headaches.  Psychiatric/Behavioral: Negative for sleep disturbance.     Social History   Tobacco Use  Smoking Status Former Smoker  . Packs/day: 1.00  . Years: 10.00  . Pack years: 10.00  . Types: Cigarettes  . Quit date: 04/06/1984  . Years since quitting: 34.9  Smokeless Tobacco Former Systems developer  . Types: Chew  . Quit date: 06/26/1978       Objective:     Wt Readings from Last 3 Encounters:  02/12/19 213 lb 6.5 oz (96.8 kg)  01/16/19 214 lb (97.1 kg)  09/27/18 223 lb (101.2 kg)     Exam deferred. Pt. Harboring due to COVID 19. Phone visit performed.   Assessment & Plan:   1. DM type 2 causing vascular disease ([place])   2. CAD S/P percutaneous coronary angioplasty   3. Mixed hyperlipidemia   4. Essential hypertension, benign  Meds ordered this encounter  Medications  . olmesartan-hydrochlorothiazide (BENICAR HCT) 40-25 MG tablet    Sig: Take 1 tablet by mouth daily.    Dispense:  90 tablet    Refill:  3  . pantoprazole (PROTONIX) 40 MG tablet    Sig: Take 1 tablet (40 mg total) by mouth 2 (two) times daily after a meal.    Dispense:  180 tablet    Refill:  3  . rosuvastatin (CRESTOR) 40 MG tablet    Sig: Take 1 tablet (40 mg total) by mouth every 3 (three) days.    Dispense:  90 tablet    Refill:  3    Orders Placed This Encounter  Procedures  . CBC with  Differential/Platelet    Standing Status:   Future    Standing Expiration Date:   03/25/2020  . CMP14+EGFR    Standing Status:   Future    Standing Expiration Date:   03/25/2020    Order Specific Question:   Has the patient fasted?    Answer:   Yes  . Bayer DCA Hb A1c Waived    Standing Status:   Future    Standing Expiration Date:   03/25/2020  . Lipid panel    Standing Status:   Future    Standing Expiration Date:   03/25/2020    Order Specific Question:   Has the patient fasted?    Answer:   Yes      Diagnoses and all orders for this visit:  DM type 2 causing vascular disease ([place]) -     CBC with Differential/Platelet; Future -     CMP14+EGFR; Future -     Bayer DCA Hb A1c Waived; Future -     Lipid panel; Future  CAD S/P percutaneous coronary angioplasty -     CBC with Differential/Platelet; Future -     CMP14+EGFR; Future -     Bayer DCA Hb A1c Waived; Future -     Lipid panel; Future  Mixed hyperlipidemia -     CBC with Differential/Platelet; Future -     CMP14+EGFR; Future -     Bayer DCA Hb A1c Waived; Future -     Lipid panel; Future  Essential hypertension, benign  Other orders -     olmesartan-hydrochlorothiazide (BENICAR HCT) 40-25 MG tablet; Take 1 tablet by mouth daily. -     pantoprazole (PROTONIX) 40 MG tablet; Take 1 tablet (40 mg total) by mouth 2 (two) times daily after a meal. -     rosuvastatin (CRESTOR) 40 MG tablet; Take 1 tablet (40 mg total) by mouth every 3 (three) days.    Virtual Visit via telephone Note  I discussed the limitations, risks, security and privacy concerns of performing an evaluation and management service by telephone and the availability of in person appointments. The patient was identified with two identifiers. Pt.expressed understanding and agreed to proceed. Pt. Is at home. Dr. Livia Snellen is in his office.  Follow Up Instructions:   I discussed the assessment and treatment plan with the patient. The patient was provided an  opportunity to ask questions and all were answered. The patient agreed with the plan and demonstrated an understanding of the instructions.   The patient was advised to call back or seek an in-person evaluation if the symptoms worsen or if the condition fails to improve as anticipated.   Total minutes including chart review and phone contact time: 24   Follow up plan: Return in about 3  months (around 06/25/2019) for hypertension, cholesterol, diabetes.  [name], MD [place]

## 2019-03-27 ENCOUNTER — Encounter: Payer: Self-pay | Admitting: Cardiology

## 2019-03-27 ENCOUNTER — Ambulatory Visit: Payer: Medicare Other | Admitting: Family Medicine

## 2019-03-27 NOTE — Progress Notes (Signed)
Cardiology Office Note   Date:  03/28/2019   ID:  Marvin Frost, DOB [birth year], MRN [id number]  PCP:  [name], MD  Cardiologist:   Minus Breeding, MD   Chief Complaint  Patient presents with  . Coronary Artery Disease      History of Present Illness: Marvin Frost is a 73 y.o. male who presents for follow up of CAD.  he had stenting of his mid LAD in December of 2019.  Since I last saw him he has done well.  He completed rehab at the hospital and is still doing virtual rehab.  He is able to walk up to 35% incline in his driveway.  He says he feels great. The patient denies any new symptoms such as chest discomfort, neck or arm discomfort. There has been no new shortness of breath, PND or orthopnea. There have been no reported palpitations, presyncope or syncope.   Past Medical History:  Diagnosis Date  . Allergy   . Arthritis   . CAD (coronary artery disease)   . 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 Cataract   . Diabetes mellitus   . Dysrhythmia    history of heart palpitations   . Gastric polyps 09/18/2017  . GERD (gastroesophageal reflux disease)   . History of frequent urinary tract infections   . History of hiatal hernia   . Hyperlipemia   . Hypertension   . Obesity   . Pancreatitis     Past Surgical History:  Procedure Laterality Date  . CARDIAC CATHETERIZATION     2004 and 1999- done by Dr Percival Spanish   . COLONOSCOPY  11/2003   diverticulosis, no polyps  . CORONARY STENT INTERVENTION N/A 05/28/2018   Procedure: CORONARY STENT INTERVENTION;  Surgeon: Sherren Mocha, MD;  Location: [facility];  Service: Cardiovascular;  Laterality: N/A;  . EYE SURGERY Bilateral    cateracts - dr Katy Fitch  . HYDROCELE EXCISION / REPAIR    . INTRAVASCULAR PRESSURE WIRE/FFR STUDY N/A 05/28/2018   Procedure: INTRAVASCULAR PRESSURE WIRE/FFR STUDY;  Surgeon: Sherren Mocha, MD;  Location: [facility];  Service: Cardiovascular;  Laterality:  N/A;  . left calf surgery      related to trauma of being torn   . LEFT HEART CATH AND CORONARY ANGIOGRAPHY N/A 05/28/2018   Procedure: LEFT HEART CATH AND CORONARY ANGIOGRAPHY;  Surgeon: Sherren Mocha, MD;  Location: [facility];  Service: Cardiovascular;  Laterality: N/A;  . left index finger arthrioscopic surgery     . lymph node removed from right neck     . right shoulder surgery    . TOTAL HIP ARTHROPLASTY Right 03/16/2015   Procedure: RIGHT TOTAL HIP ARTHROPLASTY ANTERIOR APPROACH;  Surgeon: Paralee Cancel, MD;  Location: [facility];  Service: Orthopedics;  Laterality: Right;  . TOTAL HIP ARTHROPLASTY Left 06/22/2015   Procedure: LEFT TOTAL HIP ARTHROPLASTY ANTERIOR APPROACH;  Surgeon: Paralee Cancel, MD;  Location: [facility];  Service: Orthopedics;  Laterality: Left;  . UPPER GASTROINTESTINAL ENDOSCOPY     hiatal hernia 2cm, mild gastritis - 2012, multiple hyperplastic and fundic gland gastric polyps 2019     Current Outpatient Medications  Medication Sig Dispense Refill  . amoxicillin (AMOXIL) 500 MG capsule Take 4 capsules (2,000 mg total) by mouth See admin instructions. Take four capsules (2000 mg) by mouth one hour prior to dental appointments 4 capsule 5  . aspirin EC 81 MG EC  tablet Take 1 tablet (81 mg total) by mouth daily.    . Blood Glucose Monitoring Suppl (FREESTYLE LITE) DEVI Use to check BS bid. DX. E11.9 1 each 0  . carvedilol (COREG) 3.125 MG tablet Take 1 tablet (3.125 mg total) by mouth 2 (two) times daily. 180 tablet 3  . cetirizine (ZYRTEC) 10 MG tablet Take 10 mg by mouth as needed for allergies or rhinitis.     Marland Kitchen clopidogrel (PLAVIX) 75 MG tablet Take 1 tablet (75 mg total) by mouth daily. (Patient taking differently: Take 75 mg by mouth daily. Stop taking December 2020.) 90 tablet 3  . Continuous Blood Gluc Sensor (FREESTYLE LIBRE 14 DAY SENSOR) MISC Inject 1 each into the skin every 14 (fourteen) days. Use as directed. 2 each 2  . ferrous sulfate 325 (65 FE) MG  tablet Take 1 tablet (325 mg total) by mouth every other day. (Patient taking differently: Take 325 mg by mouth every morning. ) 90 tablet 1  . glucose blood (FREESTYLE LITE) test strip USE TO CHECK BLOOD SUGAR TWICE A DAY AND AS NEEDED 300 each 3  . insulin aspart (NOVOLOG FLEXPEN) 100 UNIT/ML FlexPen INJECT 8-14 UNITS  3 times a day with meals 45 mL 3  . Insulin Degludec (TRESIBA FLEXTOUCH) 200 UNIT/ML SOPN Inject 40 Units into the skin daily. 9 pen 3  . Insulin Pen Needle (NOVOTWIST) 32G X 5 MM MISC USE WITH INSULIN PENS UP TO 5 TIMES DAILY Dx E11.59 500 each 4  . Lancets (FREESTYLE) lancets Check BS BID and PRN. DX.E11.9 300 each 3  . metFORMIN (GLUCOPHAGE) 1000 MG tablet Take 1 tablet (1,000 mg total) by mouth 2 (two) times daily with a meal. 180 tablet 1  . nitroGLYCERIN (NITROSTAT) 0.4 MG SL tablet Place 1 tablet (0.4 mg total) under the tongue every 5 (five) minutes as needed for chest pain. 25 tablet 3  . olmesartan-hydrochlorothiazide (BENICAR HCT) 40-25 MG tablet Take 1 tablet by mouth daily. 90 tablet 3  . pantoprazole (PROTONIX) 40 MG tablet Take 1 tablet (40 mg total) by mouth 2 (two) times daily after a meal. 180 tablet 3  . REPATHA SURECLICK [id number] MG/ML SOAJ INJECT 140 MG UNDER THE SKIN EVERY 14 DAYS 6 mL 4  . rosuvastatin (CRESTOR) 40 MG tablet Take 1 tablet (40 mg total) by mouth every 3 (three) days. 90 tablet 3   No current facility-administered medications for this visit.     Allergies:   Brilinta [ticagrelor], Liraglutide, Baxinets [cetylpyridinium-benzocaine], Celebrex [celecoxib], Toprol xl [metoprolol succinate], Triplex ad, Welchol [colesevelam hcl], Norvasc [amlodipine besylate], and Statins    ROS:  Please see the history of present illness.   Otherwise, review of systems are positive for none.   All other systems are reviewed and negative.    PHYSICAL EXAM: VS:  BP 132/77   Pulse 78   Temp (!) 97 F (36.1 C)   Ht 5\' 8"  (1.727 m)   Wt 208 lb 3.2 oz (94.4 kg)    SpO2 96%   BMI 31.66 kg/m  , BMI Body mass index is 31.66 kg/m. GENERAL:  Well appearing NECK:  No jugular venous distention, waveform within normal limits, carotid upstroke brisk and symmetric, no bruits, no thyromegaly LUNGS:  Clear to auscultation bilaterally CHEST:  Unremarkable HEART:  PMI not displaced or sustained,S1 and S2 within normal limits, no S3, no S4, no clicks, no rubs, no murmurs ABD:  Flat, positive bowel sounds normal in frequency in pitch, no bruits, no  rebound, no guarding, no midline pulsatile mass, no hepatomegaly, no splenomegaly EXT:  2 plus pulses throughout, no edema, no cyanosis no clubbing   EKG:  EKG is ordered today. The ekg ordered today demonstrates sinus rhythm, rate 78, axis within normal limits, intervals within normal limits, no acute ST-T wave changes.   Recent Labs: 05/27/2018: B Natriuretic Peptide 9.6 12/12/2018: Hemoglobin 14.7; Platelets 247 03/14/2019: ALT 12; BUN 13; Creatinine, Ser 1.01; Potassium 4.3; Sodium 136    Lipid Panel    Component Value Date/Time   CHOL 162 03/14/2019 1159   CHOL 157 12/31/2012 0857   TRIG 311 (H) 03/14/2019 1159   TRIG 271 (H) 02/17/2016 0824   TRIG 184 (H) 12/31/2012 0857   HDL 40 03/14/2019 1159   HDL 44 02/17/2016 0824   HDL 45 12/31/2012 0857   CHOLHDL 4.1 03/14/2019 1159   LDLCALC 72 03/14/2019 1159   LDLCALC 52 01/19/2014 1007   LDLCALC 75 12/31/2012 0857   LDLDIRECT 39 11/02/2016 0806      Wt Readings from Last 3 Encounters:  03/28/19 208 lb 3.2 oz (94.4 kg)  02/12/19 213 lb 6.5 oz (96.8 kg)  01/16/19 214 lb (97.1 kg)      Other studies Reviewed: Additional studies/ records that were reviewed today include: None. Review of the above records demonstrates:  Please see elsewhere in the note.     ASSESSMENT AND PLAN:  CAD- The patient has no new sypmtoms.  No further cardiovascular testing is indicated.  He can stop Plavix in December.  HYPERLIPIDEMIA - LDL was 69.  He will continue  meds as listed.    HYPERTENSION - The blood pressure is at target.  No change in therapy.   DM - A1C was 7.2.  This is down from 8.  I have suggested possible SGLT 2 inhibitors or GLP-1 receptor antagonist but will defer to his endocrinologist.  He mentioned this to Mr. Kosior to discuss with him.    Current medicines are reviewed at length with the patient today.  The patient does not have concerns regarding medicines.  The following changes have been made:  no change  Labs/ tests ordered today include: None  Orders Placed This Encounter  Procedures  . EKG 12-Lead     Disposition:   FU with me in 12 months.     Signed, Minus Breeding, MD  03/28/2019 2:06 PM    [place]

## 2019-03-28 ENCOUNTER — Other Ambulatory Visit: Payer: Self-pay

## 2019-03-28 ENCOUNTER — Encounter: Payer: Self-pay | Admitting: Cardiology

## 2019-03-28 ENCOUNTER — Ambulatory Visit (INDEPENDENT_AMBULATORY_CARE_PROVIDER_SITE_OTHER): Payer: Medicare Other | Admitting: Cardiology

## 2019-03-28 VITALS — BP 132/77 | HR 78 | Temp 97.0°F | Ht 68.0 in | Wt 208.2 lb

## 2019-03-28 DIAGNOSIS — Z9861 Coronary angioplasty status: Secondary | ICD-10-CM

## 2019-03-28 DIAGNOSIS — E1159 Type 2 diabetes mellitus with other circulatory complications: Secondary | ICD-10-CM | POA: Diagnosis not present

## 2019-03-28 DIAGNOSIS — I10 Essential (primary) hypertension: Secondary | ICD-10-CM

## 2019-03-28 DIAGNOSIS — I2510 Atherosclerotic heart disease of native coronary artery without angina pectoris: Secondary | ICD-10-CM

## 2019-03-28 DIAGNOSIS — E785 Hyperlipidemia, unspecified: Secondary | ICD-10-CM | POA: Diagnosis not present

## 2019-03-28 NOTE — Patient Instructions (Signed)
Medication Instructions:  Stop taking Plavix in December 2020. If you need a refill on your cardiac medications before your next appointment, please call your pharmacy.   Lab work: NONE  Testing/Procedures: NONE  Follow-Up: At [facility], you and your health needs are our priority.  As part of our continuing mission to provide you with exceptional heart care, we have created designated Provider Care Teams.  These Care Teams include your primary Cardiologist (physician) and Advanced Practice Providers (APPs -  Physician Assistants and Nurse Practitioners) who all work together to provide you with the care you need, when you need it. You will need a follow up appointment in 12 months.  Please call our office 2 months in advance to schedule this appointment.  You may see Minus Breeding, MD or one of the following Advanced Practice Providers on your designated Care Team:   Rosaria Ferries, PA-C Jory Sims, DNP, ANP

## 2019-04-24 IMAGING — XA Imaging study
1 series · 1 of 1 positions shown · non-contrast
Comparison: none

CLINICAL DATA: Low back and bilateral lower extremity pain, left
greater than right. MR demonstrates multilevel spondylitic change.

[Series 1: ortho adipose · 1 of 1 slices shown]
[im 1/1]
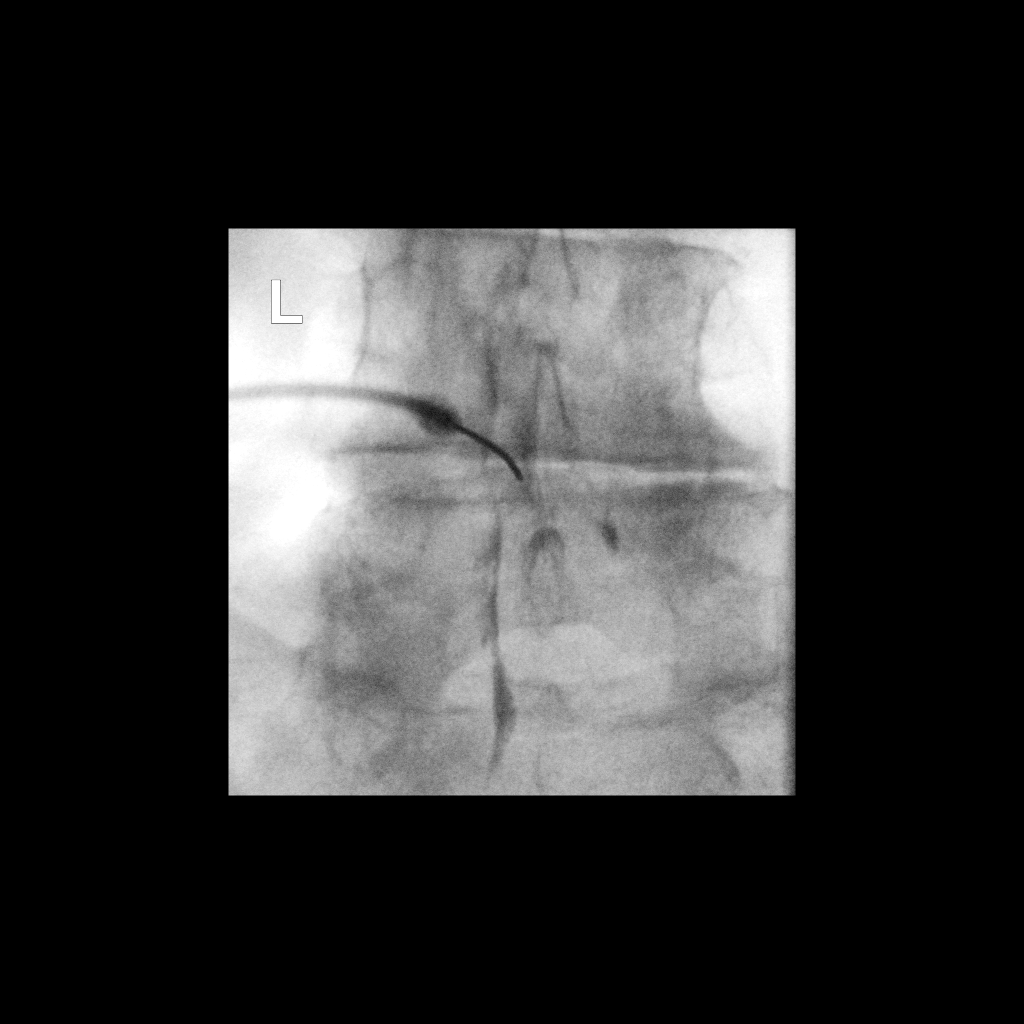

[1 of 1 positions shown; findings below may reference images not displayed]

PROCEDURE:
The procedure, risks, benefits, and alternatives were explained to
the patient. Questions regarding the procedure were encouraged and
answered. The patient understands and consents to the procedure.

LUMBAR EPIDURAL INJECTION:

An interlaminar approach was performed on left at L4-5. The
overlying skin was cleansed and anesthetized. A 20 gauge epidural
needle was advanced using loss-of-resistance technique.

DIAGNOSTIC EPIDURAL INJECTION:

Injection of Isovue-M 200 shows a good epidural pattern with spread
above and below the level of needle placement, primarily on the left
no vascular opacification is seen.

THERAPEUTIC EPIDURAL INJECTION:

120 mg of Depo-Medrol mixed with 5 mL lidocaine 1% were instilled.
The procedure was well-tolerated, and the patient was discharged
thirty minutes following the injection in good condition.

FLUOROSCOPY TIME:  15 seconds; 29 u8ymI DAP

COMPLICATIONS:
None immediate
IMPRESSION: Technically successful epidural injection on the left at L4-5.

## 2019-05-14 ENCOUNTER — Encounter (INDEPENDENT_AMBULATORY_CARE_PROVIDER_SITE_OTHER): Payer: Medicare Other | Admitting: Ophthalmology

## 2019-06-30 ENCOUNTER — Other Ambulatory Visit: Payer: Self-pay

## 2019-07-01 ENCOUNTER — Encounter: Payer: Self-pay | Admitting: Family Medicine

## 2019-07-01 ENCOUNTER — Other Ambulatory Visit: Payer: Self-pay

## 2019-07-01 ENCOUNTER — Ambulatory Visit (INDEPENDENT_AMBULATORY_CARE_PROVIDER_SITE_OTHER): Payer: Medicare Other | Admitting: Family Medicine

## 2019-07-01 VITALS — BP 155/82 | HR 86 | Temp 98.0°F | Ht 68.0 in | Wt 220.2 lb

## 2019-07-01 DIAGNOSIS — I2510 Atherosclerotic heart disease of native coronary artery without angina pectoris: Secondary | ICD-10-CM

## 2019-07-01 DIAGNOSIS — E1159 Type 2 diabetes mellitus with other circulatory complications: Secondary | ICD-10-CM

## 2019-07-01 DIAGNOSIS — Z9861 Coronary angioplasty status: Secondary | ICD-10-CM | POA: Diagnosis not present

## 2019-07-01 DIAGNOSIS — I10 Essential (primary) hypertension: Secondary | ICD-10-CM

## 2019-07-01 DIAGNOSIS — E782 Mixed hyperlipidemia: Secondary | ICD-10-CM

## 2019-07-01 LAB — BAYER DCA HB A1C WAIVED: HB A1C (BAYER DCA - WAIVED): 8 % — ABNORMAL HIGH (ref <7.0)

## 2019-07-01 MED ORDER — AMOXICILLIN 500 MG PO CAPS: 2000.0000 mg | ORAL_CAPSULE | ORAL | 10 refills | Status: DC

## 2019-07-01 MED ORDER — TRESIBA FLEXTOUCH 200 UNIT/ML ~~LOC~~ SOPN: 40.0000 [IU] | PEN_INJECTOR | Freq: Every day | SUBCUTANEOUS | 3 refills | Status: DC

## 2019-07-01 MED ORDER — ROSUVASTATIN CALCIUM 40 MG PO TABS: 40.0000 mg | ORAL_TABLET | ORAL | 3 refills | Status: DC

## 2019-07-01 MED ORDER — METOPROLOL SUCCINATE ER 50 MG PO TB24: 50.0000 mg | ORAL_TABLET | Freq: Every day | ORAL | 2 refills | Status: DC

## 2019-07-01 NOTE — Progress Notes (Signed)
Subjective:  Patient ID: Marvin Frost,  male    DOB: May 24, 1946  Age: 74 y.o.    CC: Medical Management of Chronic Issues   HPI Marvin Frost presents for  follow-up of hypertension. Patient has no history of headache chest pain or shortness of breath or recent cough. Patient also denies symptoms of TIA such as numbness weakness lateralizing. Patient denies side effects from medication. States taking it regularly.  Patient also  in for follow-up of elevated cholesterol. Doing well without complaints on current medication. Denies side effects  including myalgia and arthralgia and nausea. Also in today for liver function testing. Currently no chest pain, shortness of breath or other cardiovascular related symptoms noted.  Follow-up of diabetes. Patient does check blood sugar at home. Readings run somewhat high. He needs refill of Tresiba to help bring it down. Patient denies symptoms such as excessive hunger or urinary frequency, excessive hunger, nausea No significant hypoglycemic spells noted. Medications reviewed. Pt reports taking them regularly. Pt. denies complication/adverse reaction today.    History Marvin Frost has a past medical history of Allergy, Arthritis, CAD (coronary artery disease), Cancer ([place]), Cataract, Diabetes mellitus, Dysrhythmia, Gastric polyps (09/18/2017), GERD (gastroesophageal reflux disease), History of frequent urinary tract infections, History of hiatal hernia, Hyperlipemia, Hypertension, Obesity, and Pancreatitis.   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   His family history includes Diabetes in his brother; GI problems in his sister; Healthy in his daughter; Heart attack in his mother; Heart attack (age of onset: 68) in his father; Heart attack (age of onset: 29) in his brother; Heart disease in his brother and mother.He reports that he quit smoking about 35 years ago. His smoking use included cigarettes. He has a 10.00 pack-year smoking history. He quit smokeless tobacco use about 41 years ago.  His smokeless tobacco use included chew. He reports that he does not drink alcohol or use drugs.  Current Outpatient Medications on File Prior to Visit  Medication Sig Dispense Refill  . aspirin EC 81 MG EC tablet Take 1 tablet (81 mg total) by mouth daily.    . Blood Glucose Monitoring Suppl (FREESTYLE LITE) DEVI Use to check BS bid. DX. E11.9 1 each 0  . carvedilol (COREG) 3.125 MG tablet Take 1 tablet (3.125 mg total) by mouth 2 (two) times daily. 180 tablet 3  . cetirizine (ZYRTEC) 10 MG tablet Take 10 mg by mouth as needed for allergies or rhinitis.     . Continuous Blood Gluc Sensor (FREESTYLE LIBRE 14 DAY SENSOR) MISC Inject 1 each into the skin every 14 (fourteen) days. Use as directed. 2 each 2  . ferrous sulfate 325 (65 FE) MG tablet Take 1 tablet (325 mg total) by mouth every other day. (Patient taking differently: Take 325 mg by mouth every morning. ) 90 tablet 1  . glucose blood (FREESTYLE LITE) test strip USE TO CHECK BLOOD SUGAR TWICE A DAY AND AS NEEDED 300 each 3  . insulin aspart (NOVOLOG FLEXPEN) 100 UNIT/ML FlexPen INJECT 8-14 UNITS  3 times a day with meals (Patient taking differently: Inject 18-24 Units into the skin  3 (three) times daily with meals. INJECT 8-14 UNITS  3 times a day with meals) 45 mL 3  . Insulin Pen Needle (NOVOTWIST) 32G X 5 MM MISC USE WITH INSULIN PENS UP TO 5 TIMES DAILY Dx E11.59 500 each 4  . Lancets (FREESTYLE) lancets  Check BS BID and PRN. DX.E11.9 300 each 3  . metFORMIN (GLUCOPHAGE) 1000 MG tablet Take 1 tablet (1,000 mg total) by mouth 2 (two) times daily with a meal. 180 tablet 1  . olmesartan-hydrochlorothiazide (BENICAR HCT) 40-25 MG tablet Take 1 tablet by mouth daily. 90 tablet 3  . pantoprazole (PROTONIX) 40 MG tablet Take 1 tablet (40 mg total) by mouth 2 (two) times daily after a meal. 180 tablet 3  . REPATHA SURECLICK 771 MG/ML SOAJ INJECT 140 MG UNDER THE SKIN EVERY 14 DAYS 6 mL 4  . nitroGLYCERIN (NITROSTAT) 0.4 MG SL tablet Place 1 tablet (0.4 mg total) under the tongue every 5 (five) minutes as needed for chest pain. 25 tablet 3   No current facility-administered medications on file prior to visit.    ROS Review of Systems  Constitutional: Negative.   HENT: Negative.   Eyes: Negative for visual disturbance.  Respiratory: Negative for cough and shortness of breath.   Cardiovascular: Negative for chest pain and leg swelling.  Gastrointestinal: Negative for abdominal pain, diarrhea, nausea and vomiting.  Genitourinary: Negative for difficulty urinating.  Musculoskeletal: Negative for arthralgias and myalgias.  Skin: Negative for rash.  Neurological: Negative for headaches.  Psychiatric/Behavioral: Negative for sleep disturbance.    Objective:  BP (!) 155/82   Pulse 86   Temp 98 F (36.7 C) (Temporal)   Ht 5' 8"  (1.727 m)   Wt 220 lb 3.2 oz (99.9 kg)   SpO2 97%   BMI 33.48 kg/m   BP Readings from Last 3 Encounters:  07/01/19 (!) 155/82  03/28/19 132/77  01/16/19 132/71    Wt Readings from Last 3 Encounters:  07/01/19 220 lb 3.2 oz (99.9 kg)  03/28/19 208 lb 3.2 oz (94.4 kg)  02/12/19 213 lb 6.5 oz (96.8 kg)     Physical Exam Constitutional:      General: He is not in acute distress.    Appearance: He is well-developed.  HENT:     Head: Normocephalic and atraumatic.     Right Ear: External ear normal.     Left Ear: External ear normal.     Nose: Nose normal.    Eyes:     Conjunctiva/sclera: Conjunctivae normal.     Pupils: Pupils are equal, round, and reactive to light.  Cardiovascular:     Rate and Rhythm: Normal rate and regular rhythm.     Heart sounds: Normal heart sounds. No murmur.  Pulmonary:     Effort: Pulmonary effort is normal. No respiratory distress.     Breath sounds: Normal breath sounds. No wheezing or rales.  Abdominal:     Palpations: Abdomen is soft.     Tenderness: There is no abdominal tenderness.  Musculoskeletal:        General: Normal range of motion.     Cervical back: Normal range of motion and neck supple.  Skin:    General: Skin is warm and dry.  Neurological:     Mental Status: He is alert and oriented to person, place, and time.     Deep Tendon Reflexes: Reflexes are normal and symmetric.  Psychiatric:        Behavior: Behavior normal.  Thought Content: Thought content normal.        Judgment: Judgment normal.     Results for orders placed or performed in visit on 07/01/19  Lipid panel  Result Value Ref Range   Cholesterol, Total 127 100 - 199 mg/dL   Triglycerides 132 0 - 149 mg/dL   HDL 47 >39 mg/dL   VLDL Cholesterol Cal 23 5 - 40 mg/dL   LDL Chol Calc (NIH) 57 0 - 99 mg/dL   Chol/HDL Ratio 2.7 0.0 - 5.0 ratio  Bayer DCA Hb A1c Waived  Result Value Ref Range   HB A1C (BAYER DCA - WAIVED) 8.0 (H) <7.0 %  CMP14+EGFR  Result Value Ref Range   Glucose 134 (H) 65 - 99 mg/dL   BUN 11 8 - 27 mg/dL   Creatinine, Ser 0.78 0.76 - 1.27 mg/dL   GFR calc non Af Amer 88 >59 mL/min/1.73   GFR calc Af Amer 102 >59 mL/min/1.73   BUN/Creatinine Ratio 14 10 - 24   Sodium 139 134 - 144 mmol/L   Potassium 4.3 3.5 - 5.2 mmol/L   Chloride 101 96 - 106 mmol/L   CO2 25 20 - 29 mmol/L   Calcium 8.9 8.6 - 10.2 mg/dL   Total Protein 6.1 6.0 - 8.5 g/dL   Albumin 4.0 3.7 - 4.7 g/dL   Globulin, Total 2.1 1.5 - 4.5 g/dL   Albumin/Globulin Ratio 1.9 1.2 - 2.2   Bilirubin Total 0.2 0.0 - 1.2 mg/dL   Alkaline  Phosphatase 54 39 - 117 IU/L   AST 11 0 - 40 IU/L   ALT 13 0 - 44 IU/L  CBC with Differential/Platelet  Result Value Ref Range   WBC 8.6 3.4 - 10.8 x10E3/uL   RBC 5.24 4.14 - 5.80 x10E6/uL   Hemoglobin 15.0 13.0 - 17.7 g/dL   Hematocrit 45.4 37.5 - 51.0 %   MCV 87 79 - 97 fL   MCH 28.6 26.6 - 33.0 pg   MCHC 33.0 31.5 - 35.7 g/dL   RDW 15.0 11.6 - 15.4 %   Platelets 241 150 - 450 x10E3/uL   Neutrophils 68 Not Estab. %   Lymphs 19 Not Estab. %   Monocytes 6 Not Estab. %   Eos 4 Not Estab. %   Basos 2 Not Estab. %   Neutrophils Absolute 5.8 1.4 - 7.0 x10E3/uL   Lymphocytes Absolute 1.6 0.7 - 3.1 x10E3/uL   Monocytes Absolute 0.6 0.1 - 0.9 x10E3/uL   EOS (ABSOLUTE) 0.4 0.0 - 0.4 x10E3/uL   Basophils Absolute 0.2 0.0 - 0.2 x10E3/uL   Immature Granulocytes 1 Not Estab. %   Immature Grans (Abs) 0.1 0.0 - 0.1 x10E3/uL       Assessment & Plan:   Marvin Frost was seen today for medical management of chronic issues.  Diagnoses and all orders for this visit:  Essential hypertension, benign -     CMP14+EGFR -     metoprolol succinate (TOPROL-XL) 50 MG 24 hr tablet; Take 1 tablet (50 mg total) by mouth daily. For blood pressure control  DM type 2 causing vascular disease (HCC) -     Lipid panel -     Bayer DCA Hb A1c Waived -     CMP14+EGFR -     CBC with Differential/Platelet -     Insulin Degludec (TRESIBA FLEXTOUCH) 200 UNIT/ML SOPN; Inject 40 Units into the skin daily. (Patient taking differently: Inject 60 Units into the skin at bedtime. )  CAD S/P percutaneous  coronary angioplasty -     Lipid panel -     CMP14+EGFR -     CBC with Differential/Platelet  Mixed hyperlipidemia -     Lipid panel -     CMP14+EGFR -     CBC with Differential/Platelet -     rosuvastatin (CRESTOR) 40 MG tablet; Take 1 tablet (40 mg total) by mouth every 3 (three) days.  Other orders -     amoxicillin (AMOXIL) 500 MG capsule; Take 4 capsules (2,000 mg total) by mouth See admin instructions. Take  four capsules (2000 mg) by mouth one hour prior to dental appointments   I have discontinued Izayiah Caven's clopidogrel. I am also having him start on metoprolol succinate. Additionally, I am having him maintain his FreeStyle Lite, cetirizine, glucose blood, freestyle, aspirin, ferrous sulfate, Repatha SureClick, nitroGLYCERIN, carvedilol, NovoLOG FlexPen, FreeStyle Libre 14 Day Sensor, NovoTwist, metFORMIN, olmesartan-hydrochlorothiazide, pantoprazole, rosuvastatin, Tresiba FlexTouch, and amoxicillin.  Meds ordered this encounter  Medications  . rosuvastatin (CRESTOR) 40 MG tablet    Sig: Take 1 tablet (40 mg total) by mouth every 3 (three) days.    Dispense:  90 tablet    Refill:  3  . Insulin Degludec (TRESIBA FLEXTOUCH) 200 UNIT/ML SOPN    Sig: Inject 40 Units into the skin daily.    Dispense:  9 pen    Refill:  3  . metoprolol succinate (TOPROL-XL) 50 MG 24 hr tablet    Sig: Take 1 tablet (50 mg total) by mouth daily. For blood pressure control    Dispense:  30 tablet    Refill:  2  . amoxicillin (AMOXIL) 500 MG capsule    Sig: Take 4 capsules (2,000 mg total) by mouth See admin instructions. Take four capsules (2000 mg) by mouth one hour prior to dental appointments    Dispense:  4 capsule    Refill:  10   Uncontrolled BP and diabetes adressed today. Refills given. Pt. To monitor closely. Med renewal should help bring both under control. Diet, exercise salt avoidance recommended.   Follow-up: Return in about 3 months (around 09/29/2019).  [name], M.D.

## 2019-07-02 LAB — CBC WITH DIFFERENTIAL/PLATELET
Basophils Absolute: 0.2 10*3/uL (ref 0.0–0.2)
Basos: 2 %
EOS (ABSOLUTE): 0.4 10*3/uL (ref 0.0–0.4)
Eos: 4 %
Hematocrit: 45.4 % (ref 37.5–51.0)
Hemoglobin: 15 g/dL (ref 13.0–17.7)
Immature Grans (Abs): 0.1 10*3/uL (ref 0.0–0.1)
Immature Granulocytes: 1 %
Lymphocytes Absolute: 1.6 10*3/uL (ref 0.7–3.1)
Lymphs: 19 %
MCH: 28.6 pg (ref 26.6–33.0)
MCHC: 33 g/dL (ref 31.5–35.7)
MCV: 87 fL (ref 79–97)
Monocytes Absolute: 0.6 10*3/uL (ref 0.1–0.9)
Monocytes: 6 %
Neutrophils Absolute: 5.8 10*3/uL (ref 1.4–7.0)
Neutrophils: 68 %
Platelets: 241 10*3/uL (ref 150–450)
RBC: 5.24 x10E6/uL (ref 4.14–5.80)
RDW: 15 % (ref 11.6–15.4)
WBC: 8.6 10*3/uL (ref 3.4–10.8)

## 2019-07-02 LAB — CMP14+EGFR
ALT: 13 IU/L (ref 0–44)
AST: 11 IU/L (ref 0–40)
Albumin/Globulin Ratio: 1.9 (ref 1.2–2.2)
Albumin: 4 g/dL (ref 3.7–4.7)
Alkaline Phosphatase: 54 IU/L (ref 39–117)
BUN/Creatinine Ratio: 14 (ref 10–24)
BUN: 11 mg/dL (ref 8–27)
Bilirubin Total: 0.2 mg/dL (ref 0.0–1.2)
CO2: 25 mmol/L (ref 20–29)
Calcium: 8.9 mg/dL (ref 8.6–10.2)
Chloride: 101 mmol/L (ref 96–106)
Creatinine, Ser: 0.78 mg/dL (ref 0.76–1.27)
GFR calc Af Amer: 102 mL/min/{1.73_m2} (ref >59)
GFR calc non Af Amer: 88 mL/min/{1.73_m2} (ref >59)
Globulin, Total: 2.1 g/dL (ref 1.5–4.5)
Glucose: 134 mg/dL — ABNORMAL HIGH (ref 65–99)
Potassium: 4.3 mmol/L (ref 3.5–5.2)
Sodium: 139 mmol/L (ref 134–144)
Total Protein: 6.1 g/dL (ref 6.0–8.5)

## 2019-07-02 LAB — LIPID PANEL
Chol/HDL Ratio: 2.7 ratio (ref 0.0–5.0)
Cholesterol, Total: 127 mg/dL (ref 100–199)
HDL: 47 mg/dL (ref >39)
LDL Chol Calc (NIH): 57 mg/dL (ref 0–99)
Triglycerides: 132 mg/dL (ref 0–149)
VLDL Cholesterol Cal: 23 mg/dL (ref 5–40)

## 2019-07-02 NOTE — Progress Notes (Signed)
Hello Rishi,  Your lab result is normal and/or stable.Some minor variations that are not significant are commonly marked abnormal, but do not represent any medical problem for you.  Best regards, Anel Creighton, M.D.

## 2019-07-03 ENCOUNTER — Encounter: Payer: Self-pay | Admitting: "Endocrinology

## 2019-07-09 ENCOUNTER — Encounter: Payer: Self-pay | Admitting: Family Medicine

## 2019-07-14 ENCOUNTER — Telehealth: Payer: Self-pay | Admitting: Family Medicine

## 2019-07-14 MED ORDER — CARVEDILOL 3.125 MG PO TABS: 3.1250 mg | ORAL_TABLET | Freq: Two times a day (BID) | ORAL | 0 refills | Status: DC

## 2019-07-14 NOTE — Telephone Encounter (Signed)
Pt aware 30 d supply sent to [facility]

## 2019-07-14 NOTE — Telephone Encounter (Signed)
What is the name of the medication? carvedilol (COREG) 3.125 MG tablet    Have you contacted your pharmacy to request a refill? YES, He usually gets it from a mail order company, it will take a little longer to get to him needs 30 day supply sent to local pharmacy   Which pharmacy would you like this sent to? [facility]    Patient notified that their request is being sent to the clinical staff for review and that they should receive a call once it is complete. If they do not receive a call within 24 hours they can check with their pharmacy or our office.

## 2019-07-17 ENCOUNTER — Telehealth: Payer: Self-pay | Admitting: Family Medicine

## 2019-07-17 ENCOUNTER — Other Ambulatory Visit: Payer: Self-pay | Admitting: *Deleted

## 2019-07-17 DIAGNOSIS — I10 Essential (primary) hypertension: Secondary | ICD-10-CM

## 2019-07-17 MED ORDER — METOPROLOL SUCCINATE ER 50 MG PO TB24: 50.0000 mg | ORAL_TABLET | Freq: Every day | ORAL | 2 refills | Status: DC

## 2019-07-17 NOTE — Telephone Encounter (Signed)
What is the name of the medication? Metoprolol, 50mg   Have you contacted your pharmacy to request a refill? Yes  Which [facility] you [facility] [employer] called stating that he spoke with someone [facility] who said that his Rx must have got lost. Was advised to call Dr Livia Snellen and let him know about this and to send an override request to [facility] so that patient can pick up his Rx refill at local pharmacy instead.   Wants to be called when this is completed.    Patient notified that their request is being sent to the clinical staff for review and that they should receive a call once it is complete. If they do not receive a call within 24 hours they can check with their pharmacy or our office.

## 2019-07-17 NOTE — Telephone Encounter (Signed)
[facility]

## 2019-07-21 ENCOUNTER — Encounter: Payer: Self-pay | Admitting: "Endocrinology

## 2019-07-21 ENCOUNTER — Other Ambulatory Visit: Payer: Self-pay

## 2019-07-21 ENCOUNTER — Ambulatory Visit (INDEPENDENT_AMBULATORY_CARE_PROVIDER_SITE_OTHER): Payer: Medicare Other | Admitting: "Endocrinology

## 2019-07-21 DIAGNOSIS — I2510 Atherosclerotic heart disease of native coronary artery without angina pectoris: Secondary | ICD-10-CM | POA: Diagnosis not present

## 2019-07-21 DIAGNOSIS — E1159 Type 2 diabetes mellitus with other circulatory complications: Secondary | ICD-10-CM | POA: Diagnosis not present

## 2019-07-21 DIAGNOSIS — Z9861 Coronary angioplasty status: Secondary | ICD-10-CM | POA: Diagnosis not present

## 2019-07-21 MED ORDER — TRESIBA FLEXTOUCH 200 UNIT/ML ~~LOC~~ SOPN: 70.0000 [IU] | PEN_INJECTOR | Freq: Every day | SUBCUTANEOUS | 0 refills | Status: DC

## 2019-07-21 MED ORDER — FREESTYLE LITE TEST VI STRP: ORAL_STRIP | 1 refills | Status: DC

## 2019-07-21 MED ORDER — METOPROLOL SUCCINATE ER 50 MG PO TB24: 50.0000 mg | ORAL_TABLET | Freq: Every day | ORAL | 0 refills | Status: DC

## 2019-07-21 MED ORDER — TRULICITY 0.75 MG/0.5ML ~~LOC~~ SOAJ: 0.7500 mg | SUBCUTANEOUS | 1 refills | Status: DC

## 2019-07-21 NOTE — Patient Instructions (Signed)

## 2019-07-21 NOTE — Progress Notes (Signed)
07/21/2019, 5:24 PM                                                    Endocrinology Telehealth Visit Follow up Note -During COVID -19 Pandemic  This visit type was conducted due to national recommendations for restrictions regarding the COVID-19 Pandemic  in an effort to limit this patient's exposure and mitigate transmission of the corona virus.  Due to his co-morbid illnesses, Marvin Frost is at  moderate to high risk for complications without adequate follow up.  This format is felt to be most appropriate for him at this time.  I connected with this patient on 07/21/2019   by telephone and verified that I am speaking with the correct person using two identifiers. Marvin Frost, 1945-09-29. he has verbally consented to this visit. All issues noted in this document were discussed and addressed. The format was not optimal for physical exam.    Subjective:    Patient ID: Marvin Frost, male    DOB: 01-04-1946.  Marvin Frost is being engaged in telehealth via telephone follow-up after he was seen in consultation for management of currently uncontrolled symptomatic diabetes requested by  [name], MD.   Past Medical History:  Diagnosis Date  . Allergy   . Arthritis   . CAD (coronary artery disease)   . 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 Cataract   . Diabetes mellitus   . Dysrhythmia    history of heart palpitations   . Gastric polyps 09/18/2017  . GERD (gastroesophageal reflux disease)   . History of frequent urinary tract infections   . History of hiatal hernia   . Hyperlipemia   . Hypertension   . Obesity   . Pancreatitis     Past Surgical History:  Procedure Laterality Date  . CARDIAC CATHETERIZATION     2004 and 1999- done by Dr Percival Spanish   . COLONOSCOPY  11/2003   diverticulosis, no polyps  . CORONARY STENT INTERVENTION N/A 05/28/2018   Procedure: CORONARY STENT  INTERVENTION;  Surgeon: Sherren Mocha, MD;  Location: [facility];  Service: Cardiovascular;  Laterality: N/A;  . EYE SURGERY Bilateral    cateracts - dr Katy Fitch  . HYDROCELE EXCISION / REPAIR    . INTRAVASCULAR PRESSURE WIRE/FFR STUDY N/A 05/28/2018   Procedure: INTRAVASCULAR PRESSURE WIRE/FFR STUDY;  Surgeon: Sherren Mocha, MD;  Location: [facility];  Service: Cardiovascular;  Laterality: N/A;  . left calf surgery      related to trauma of being torn   . LEFT HEART CATH AND CORONARY ANGIOGRAPHY N/A 05/28/2018   Procedure: LEFT HEART CATH AND CORONARY ANGIOGRAPHY;  Surgeon: Sherren Mocha, MD;  Location: [facility];  Service: Cardiovascular;  Laterality: N/A;  . left index finger arthrioscopic surgery     . lymph node removed from right neck     . right shoulder surgery    .  TOTAL HIP ARTHROPLASTY Right 03/16/2015   Procedure: RIGHT TOTAL HIP ARTHROPLASTY ANTERIOR APPROACH;  Surgeon: Paralee Cancel, MD;  Location: [facility];  Service: Orthopedics;  Laterality: Right;  . TOTAL HIP ARTHROPLASTY Left 06/22/2015   Procedure: LEFT TOTAL HIP ARTHROPLASTY ANTERIOR APPROACH;  Surgeon: Paralee Cancel, MD;  Location: [facility];  Service: Orthopedics;  Laterality: Left;  . UPPER GASTROINTESTINAL ENDOSCOPY     hiatal hernia 2cm, mild gastritis - 2012, multiple hyperplastic and fundic gland gastric polyps 2019    Social History   Socioeconomic History  . Marital status: Married    Spouse name: Not on file  . Number of children: 1  . Years of education: Not on file  . Highest education level: Associate degree: occupational, Hotel manager, or vocational program  Occupational History  . Occupation: Best boy: [employer]    Comment: retired  Tobacco Use  . Smoking status: Former Smoker    Packs/day: 1.00    Years: 10.00    Pack years: 10.00    Types: Cigarettes    Quit date: 04/06/1984    Years since quitting: 35.3  . Smokeless tobacco: Former Systems developer    Types: [place] date: 06/26/1978  Substance and Sexual Activity  . Alcohol use: No  . Drug use: No  . Sexual activity: Yes  Other Topics Concern  . Not on file  Social History Narrative   Married   Retired from [employer]   1 child   Former but no current tobacco   No EtOH/drugs   Social Determinants of Radio broadcast assistant Strain:   . Difficulty of Paying Living Expenses: Not on file  Food Insecurity:   . Worried About Charity fundraiser in the Last Year: Not on file  . Ran Out of Food in the Last Year: Not on file  Transportation Needs:   . Lack of Transportation (Medical): Not on file  . Lack of Transportation (Non-Medical): Not on file  Physical Activity: Unknown  . Days of Exercise per Week: Not on file  . Minutes of Exercise per Session: 20 min  Stress:   . Feeling of Stress : Not on file  Social Connections:   . Frequency of Communication with Friends and Family: Not on file  . Frequency of Social Gatherings with Friends and Family: Not on file  . Attends Religious Services: Not on file  . Active Member of Clubs or Organizations: Not on file  . Attends Archivist Meetings: Not on file  . Marital Status: Not on file    Family History  Problem Relation Age of Onset  . Heart disease Mother   . Heart attack Mother   . Diabetes Brother   . Heart disease Brother   . Heart attack Brother 39  . Heart attack Father 58  . GI problems Sister        torn esophagus during endoscopy  . Healthy Daughter   . Colon polyps Neg Hx   . Esophageal cancer Neg Hx   . Rectal cancer Neg Hx   . Stomach cancer Neg Hx     Outpatient Encounter Medications as of 07/21/2019  Medication Sig  . amoxicillin (AMOXIL) 500 MG capsule Take 4 capsules (2,000 mg total) by mouth See admin instructions. Take four capsules (2000 mg) by mouth one hour prior to dental appointments  . aspirin EC 81 MG EC tablet Take 1 tablet (81 mg total) by mouth daily.  Marland Kitchen  Blood Glucose Monitoring  Suppl (FREESTYLE LITE) DEVI Use to check BS bid. DX. E11.9  . carvedilol (COREG) 3.125 MG tablet Take 1 tablet (3.125 mg total) by mouth 2 (two) times daily.  . cetirizine (ZYRTEC) 10 MG tablet Take 10 mg by mouth as needed for allergies or rhinitis.   . Continuous Blood Gluc Sensor (FREESTYLE LIBRE 14 DAY SENSOR) MISC Inject 1 each into the skin every 14 (fourteen) days. Use as directed.  . Dulaglutide (TRULICITY) A999333 0000000 SOPN Inject 0.75 mg into the skin once a week.  . ferrous sulfate 325 (65 FE) MG tablet Take 1 tablet (325 mg total) by mouth every other day. (Patient taking differently: Take 325 mg by mouth every morning. )  . glucose blood (FREESTYLE LITE) test strip USE TO CHECK BLOOD SUGAR TWICE A DAY AND AS NEEDED  . insulin aspart (NOVOLOG FLEXPEN) 100 UNIT/ML FlexPen INJECT 8-14 UNITS  3 times a day with meals (Patient taking differently: Inject 18-24 Units into the skin 3 (three) times daily with meals. INJECT 8-14 UNITS  3 times a day with meals)  . Insulin Degludec (TRESIBA FLEXTOUCH) 200 UNIT/ML SOPN Inject 70 Units into the skin daily.  . Insulin Pen Needle (NOVOTWIST) 32G X 5 MM MISC USE WITH INSULIN PENS UP TO 5 TIMES DAILY Dx E11.59  . Lancets (FREESTYLE) lancets Check BS BID and PRN. DX.E11.9  . metFORMIN (GLUCOPHAGE) 1000 MG tablet Take 1 tablet (1,000 mg total) by mouth 2 (two) times daily with a meal.  . nitroGLYCERIN (NITROSTAT) 0.4 MG SL tablet Place 1 tablet (0.4 mg total) under the tongue every 5 (five) minutes as needed for chest pain.  Marland Kitchen olmesartan-hydrochlorothiazide (BENICAR HCT) 40-25 MG tablet Take 1 tablet by mouth daily.  . pantoprazole (PROTONIX) 40 MG tablet Take 1 tablet (40 mg total) by mouth 2 (two) times daily after a meal.  . REPATHA SURECLICK [id number] MG/ML SOAJ INJECT 140 MG UNDER THE SKIN EVERY 14 DAYS  . rosuvastatin (CRESTOR) 40 MG tablet Take 1 tablet (40 mg total) by mouth every 3 (three) days.  . [DISCONTINUED] glucose blood (FREESTYLE LITE) test  strip USE TO CHECK BLOOD SUGAR TWICE A DAY AND AS NEEDED  . [DISCONTINUED] Insulin Degludec (TRESIBA FLEXTOUCH) 200 UNIT/ML SOPN Inject 40 Units into the skin daily. (Patient taking differently: Inject 60 Units into the skin at bedtime. )  . [DISCONTINUED] metoprolol succinate (TOPROL-XL) 50 MG 24 hr tablet Take 1 tablet (50 mg total) by mouth daily. For blood pressure control   No facility-administered encounter medications on file as of 07/21/2019.    ALLERGIES: Allergies  Allergen Reactions  . Brilinta [Ticagrelor]     Rash and SOB   . Liraglutide Other (See Comments)    Abdominal pain and enlarged pancreas on CT ? pancreatitis  . Baxinets [Cetylpyridinium-Benzocaine] Other (See Comments)    Unknown reaction  . Celebrex [Celecoxib] Nausea Only  . Toprol Xl [Metoprolol Succinate] Other (See Comments)    Unknown reaction - pt states it did not work  . Triplex Ad Other (See Comments)    Abdominal pain and enlarged pancreas on CT? pancreatitis  . Welchol [Colesevelam Hcl] Other (See Comments)    constipation  . Norvasc [Amlodipine Besylate] Rash  . Statins Other (See Comments)    muscle and joint pain if taken for long periods of time, takes Crestor every 2-3 days to avoid this reaction    VACCINATION STATUS: Immunization History  Administered Date(s) Administered  . Fluad Quad(high Dose 65+)  03/26/2019  . Influenza Whole 02/24/2010  . Influenza, High Dose Seasonal PF 04/15/2015, 04/12/2016, 04/05/2017, 04/16/2018  . Influenza,inj,Quad PF,6+ Mos 03/26/2013, 03/30/2014  . Pneumococcal Conjugate-13 06/17/2013  . Pneumococcal Polysaccharide-23 02/24/2009  . Td 02/24/2006  . Tdap 04/27/2011  . Zoster 07/27/2006  . Zoster Recombinat (Shingrix) 07/22/2018    Diabetes He presents for his follow-up diabetic visit. He has type 2 diabetes mellitus. Onset time: He was diagnosed at approximate age of 42 years. His disease course has been fluctuating. There are no hypoglycemic associated  symptoms. Pertinent negatives for hypoglycemia include no confusion, headaches, pallor or seizures. Pertinent negatives for diabetes include no chest pain, no fatigue, no polydipsia, no polyphagia, no polyuria and no weakness. There are no hypoglycemic complications. Symptoms are improving. Diabetic complications include heart disease. Risk factors for coronary artery disease include diabetes mellitus, dyslipidemia, family history, male sex, hypertension, sedentary lifestyle, tobacco exposure and obesity. Current diabetic treatment includes insulin injections and oral agent (monotherapy). He is compliant with treatment most of the time. His weight is increasing steadily. He is following a generally unhealthy diet. When asked about meal planning, he reported none. He participates in exercise intermittently. His breakfast blood glucose range is generally 140-180 mg/dl. His lunch blood glucose range is generally 140-180 mg/dl. His dinner blood glucose range is generally 140-180 mg/dl. His bedtime blood glucose range is generally 140-180 mg/dl. His overall blood glucose range is 140-180 mg/dl. (His CGM analysis shows 67% time in range, 33% above range, and 0% hypoglycemia.  His average blood glucose is 168 for the last 2 weeks with estimated A1c of 7.3%.  His point-of-care A1c is 8%.) An ACE inhibitor/angiotensin II receptor blocker is being taken. Eye exam is current.  Hyperlipidemia This is a chronic problem. The current episode started more than 1 year ago. The problem is controlled. Exacerbating diseases include diabetes and obesity. Pertinent negatives include no chest pain, myalgias or shortness of breath. Current antihyperlipidemic treatment includes statins (He is on Crestor 20 mg p.o. 2 to 3 days a week, Repatha 140 mg every 4 weeks.). The current treatment provides moderate improvement of lipids. Risk factors for coronary artery disease include male sex, obesity, family history, dyslipidemia and diabetes  mellitus.  Hypertension This is a chronic problem. The current episode started more than 1 year ago. The problem is controlled. Pertinent negatives include no chest pain, headaches, neck pain, palpitations or shortness of breath. Risk factors for coronary artery disease include dyslipidemia, diabetes mellitus, family history, male gender, obesity, sedentary lifestyle and smoking/tobacco exposure. Past treatments include angiotensin blockers. Hypertensive end-organ damage includes CAD/MI.    Review of systems:  Constitutional: +weight gain, no fatigue, no subjective hyperthermia, no subjective hypothermia Eyes: no blurry vision, no xerophthalmia ENT: no sore throat, no nodules palpated in throat, no dysphagia/odynophagia, no hoarseness Cardiovascular: no Chest Pain, no Shortness of Breath, no palpitations, no leg swelling Respiratory: no cough, no SOB Gastrointestinal: no Nausea/Vomiting/Diarhhea Musculoskeletal: no muscle/joint aches Skin: no rashes Neurological: no tremors, no numbness, no tingling, no dizziness Psychiatric: no depression, no anxiety   Objective:    BP (!) 161/89   Pulse 85   Ht 5\' 8"  (1.727 m)   Wt 221 lb 9.6 oz (100.5 kg)   BMI 33.69 kg/m   Wt Readings from Last 3 Encounters:  07/21/19 221 lb 9.6 oz (100.5 kg)  07/01/19 220 lb 3.2 oz (99.9 kg)  03/28/19 208 lb 3.2 oz (94.4 kg)     Physical Exam- Limited  Constitutional:  Body mass index is 33.69 kg/m. , not in acute distress, normal state of mind Eyes:  EOMI, no exophthalmos Neck: Supple Respiratory: Adequate breathing efforts Musculoskeletal: no gross deformities, strength intact in all four extremities, no gross restriction of joint movements Skin:  no rashes, no hyperemia Neurological: no tremor with outstretched hands.  CMP     Component Value Date/Time   NA 139 07/01/2019 1500   K 4.3 07/01/2019 1500   CL 101 07/01/2019 1500   CO2 25 07/01/2019 1500   GLUCOSE 134 (H) 07/01/2019 1500    GLUCOSE 154 (H) 05/29/2018 0217   BUN 11 07/01/2019 1500   CREATININE 0.78 07/01/2019 1500   CREATININE 0.95 12/31/2012 0857   CALCIUM 8.9 07/01/2019 1500   PROT 6.1 07/01/2019 1500   ALBUMIN 4.0 07/01/2019 1500   AST 11 07/01/2019 1500   ALT 13 07/01/2019 1500   ALKPHOS 54 07/01/2019 1500   BILITOT 0.2 07/01/2019 1500   GFRNONAA 88 07/01/2019 1500   GFRNONAA 81 12/31/2012 0857   GFRAA 102 07/01/2019 1500   GFRAA >89 12/31/2012 0857    Diabetic Labs (most recent): Lab Results  Component Value Date   HGBA1C 8.0 (H) 07/01/2019   HGBA1C 7.2 (H) 03/14/2019   HGBA1C 8.2 (H) 12/12/2018     Lipid Panel ( most recent) Lipid Panel     Component Value Date/Time   CHOL 127 07/01/2019 1500   CHOL 157 12/31/2012 0857   TRIG 132 07/01/2019 1500   TRIG 271 (H) 02/17/2016 0824   TRIG 184 (H) 12/31/2012 0857   HDL 47 07/01/2019 1500   HDL 44 02/17/2016 0824   HDL 45 12/31/2012 0857   CHOLHDL 2.7 07/01/2019 1500   LDLCALC 57 07/01/2019 1500   LDLCALC 52 01/19/2014 1007   LDLCALC 75 12/31/2012 0857   LDLDIRECT 39 11/02/2016 0806      Lab Results  Component Value Date   TSH 2.290 09/03/2014     Assessment & Plan:   1. DM type 2 causing vascular disease ([place])  - Keyandre Moyo has currently uncontrolled symptomatic type 2 DM since  74 years of age.  His CGM analysis shows 67% time in range, 33% above range, and 0% hypoglycemia.  His average blood glucose is 168 for the last 2 weeks with estimated A1c of 7.3%.  His point-of-care A1c is 8%. -He reports consistently on target glycemic profile using his CGM, and A1c of 7.2% improving from 8.2%.  -I have reviewed his recent labs with him. - I had a long discussion with him about the progressive nature of diabetes and the pathology behind its complications. -his diabetes is complicated by coronary artery disease, obesity and he remains at a high risk for more acute and chronic complications which include CAD, CVA, CKD, retinopathy, and  neuropathy. These are all discussed in detail with him.  - I have counseled him on diet management and weight loss, by adopting a carbohydrate restricted/protein rich diet.  - he  admits there is a room for improvement in his diet and drink choices. -  Suggestion is made for him to avoid simple carbohydrates  from his diet including Cakes, Sweet Desserts / Pastries, Ice Cream, Soda (diet and regular), Sweet Tea, Candies, Chips, Cookies, Sweet Pastries,  Store Bought Juices, Alcohol in Excess of  1-2 drinks a day, Artificial Sweeteners, Coffee Creamer, and "Sugar-free" Products. This will help patient to have stable blood glucose profile and potentially avoid unintended weight gain.   - I encouraged him  to switch to  unprocessed or minimally processed complex starch and increased protein intake (animal or plant source), fruits, and vegetables.  - he is advised to stick to a routine mealtimes to eat 3 meals  a day and avoid unnecessary snacks ( to snack only to correct hypoglycemia).   - he is  scheduled with Marvin Frost, RDN, CDE for individualized diabetes education.  - I have approached him with the following individualized plan to manage diabetes and patient agrees:   -Given his high risk profile, he will continue to benefit from intensive treatment with basal/bolus insulin.  -He is advised to increase his [place] to 70 units nightly, continue NovoLog at 18 units units 3 times a day with meals  for pre-meal BG readings of 90-150mg /dl, plus patient specific correction dose for unexpected hyperglycemia above 150mg /dl, associated with strict monitoring of glucose 4 times a day-before meals and at bedtime. - he is warned not to take insulin without proper monitoring per orders. - Adjustment parameters are given to him for hypo and hyperglycemia in writing. - he is encouraged to call clinic for blood glucose levels less than 70 or above 200 mg /dl. -He is advised to continue Metformin 1000 mg  p.o. twice daily-after breakfast and after supper. -He will benefit from incretin therapy.  I discussed and initiated low-dose Trulicity A999333 mg subcutaneously weekly, to advance as tolerated.    - Patient specific target  A1c;  LDL, HDL, Triglycerides, and  Waist Circumference were discussed in detail.  2) Blood Pressure /Hypertension:   His blood pressure is  above target today.   he is advised to continue his current medications including Benicar/HCTZ 40/25 mg p.o. daily with breakfast .  3) Lipids/Hyperlipidemia:   Review of his recent lipid panel showed  controlled  LDL at 57.  He is benefiting from statin as well as his Repatha.  He is advised to continue  Crestor 20 mg every other day, Repatha 140 mg subcutaneously every 4 weeks.  Side effects and precautions discussed with him.  4)  Weight/Diet: His BMI is 32-   he is a candidate for weight loss.  I discussed with him the fact that loss of 5 - 10% of his  current body weight will have the most impact on his diabetes management.  CDE Consult will be initiated . Exercise, and detailed carbohydrates information provided  -  detailed on discharge instructions.  5) Chronic Care/Health Maintenance:  -he  is on ACEI/ARB and Statin medications and  is encouraged to initiate and continue to follow up with Ophthalmology, Dentist,  Podiatrist at least yearly or according to recommendations, and advised to  stay away from smoking. I have recommended yearly flu vaccine and pneumonia vaccine at least every 5 years; moderate intensity exercise for up to 150 minutes weekly; and  sleep for at least 7 hours a day.  - he is  advised to maintain close follow up with [name], MD for primary care needs, as well as his other providers for optimal and coordinated care.  - Time spent on this patient care encounter:  35 min, of which > 50% was spent in  counseling and the rest reviewing his blood glucose logs , discussing his hypoglycemia and hyperglycemia  episodes, reviewing his current and  previous labs / studies  ( including abstraction from other facilities) and medications  doses and developing a  long term treatment plan and documenting his care.   Please refer to Patient Instructions for  Blood Glucose Monitoring and Insulin/Medications Dosing Guide"  in media tab for additional information. Please  also refer to " Patient Self Inventory" in the Media  tab for reviewed elements of pertinent patient history.  Marvin Frost participated in the discussions, expressed understanding, and voiced agreement with the above plans.  All questions were answered to his satisfaction. he is encouraged to contact clinic should he have any questions or concerns prior to his return visit.   Follow up plan: - Return in about 4 months (around 11/18/2019) for Bring Meter and Logs- A1c in Office.  Glade Lloyd, MD [facility] [address] [place], [place] [address] Phone: [phone]  Fax: [phone]    07/21/2019, 5:24 PM  This note was partially dictated with voice recognition software. Similar sounding words can be transcribed inadequately or may not  be corrected upon review.

## 2019-07-21 NOTE — Telephone Encounter (Signed)
Medication sent.  Patient aware

## 2019-07-21 NOTE — Telephone Encounter (Signed)
Pt needs rx metoprolol succinate (TOPROL-XL) 50 MG 24 hr tablet sent to [facility].

## 2019-07-24 ENCOUNTER — Ambulatory Visit (INDEPENDENT_AMBULATORY_CARE_PROVIDER_SITE_OTHER): Payer: Medicare Other | Admitting: *Deleted

## 2019-07-24 DIAGNOSIS — Z0000 Encounter for general adult medical examination without abnormal findings: Secondary | ICD-10-CM | POA: Diagnosis not present

## 2019-07-24 NOTE — Progress Notes (Signed)
MEDICARE ANNUAL WELLNESS VISIT  07/24/2019  Telephone Visit Disclaimer This Medicare AWV was conducted by telephone due to national recommendations for restrictions regarding the COVID-19 Pandemic (e.g. social distancing).  I verified, using two identifiers, that I am speaking with Marvin Frost or their authorized healthcare agent. I discussed the limitations, risks, security, and privacy concerns of performing an evaluation and management service by telephone and the potential availability of an in-person appointment in the future. The patient expressed understanding and agreed to proceed.   Subjective:  Marvin Frost is a 74 y.o. male patient of Stacks, Cletus Gash, MD who had a Medicare Annual Wellness Visit today via telephone. Zalyn is Retired and lives with their spouse. he has 1 child. he reports that he is socially active and does interact with friends/family regularly. 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  Patient Care Team: [name], MD as PCP - General (Family Medicine) Minus Breeding, MD as PCP - Cardiology (Cardiology) Hayden Pedro, MD as Consulting Physician (Ophthalmology) Paralee Cancel, MD as Consulting Physician (Orthopedic Surgery) Minus Breeding, MD as Consulting Physician (Cardiology)  Advanced Directives 07/24/2019 07/22/2018 05/27/2018 07/26/2017 07/10/2017 11/12/2015 06/22/2015  Does Patient Have a Medical Advance Directive? Yes Yes No Yes Yes Yes Yes  Type of Advance Directive Living will;Healthcare Power of [place];Living will - - - [place];Living will [place];Living will  Does patient want to make changes to medical advance directive? No - Patient declined No - Patient declined - - - - No - Patient declined  Copy of [place] in Chart? No - copy requested No - copy requested - - - Yes Yes  Would patient like information on  creating a medical advance directive? - - No - Patient declined - - - -  Pre-existing out of facility DNR order (yellow form or pink MOST form) - - - - - - -    Hospital Utilization Over the Past 12 Months: # of hospitalizations or ER visits: 0 # of surgeries: 0  Review of Systems    Patient reports that his overall health is unchanged compared to last year.  History obtained from chart review and the patient General ROS: negative  Patient Reported Readings (BP, Pulse, CBG, Weight, etc) CBG 149  Pain Assessment Pain : No/denies pain     Current Medications & Allergies (verified) Allergies as of 07/24/2019      Reactions   Brilinta [ticagrelor]    Rash and SOB    Liraglutide Other (See Comments)   Abdominal pain and enlarged pancreas on CT ? pancreatitis   Baxinets [cetylpyridinium-benzocaine] Other (See Comments)   Unknown reaction   Celebrex [celecoxib] Nausea Only   Toprol Xl [metoprolol Succinate] Other (See Comments)   Unknown reaction - pt states it did not work   Triplex Ad Other (See Comments)   Abdominal pain and enlarged pancreas on CT? pancreatitis   Welchol [colesevelam Hcl] Other (See Comments)   constipation   Norvasc [amlodipine Besylate] Rash   Statins Other (See Comments)   muscle and joint pain if taken for long periods of time, takes Crestor every 2-3 days to avoid this reaction      Medication List       Accurate as of July 24, 2019  9:59 AM. If you have any questions, ask your nurse or doctor.        amoxicillin 500 MG capsule Commonly  known as: AMOXIL Take 4 capsules (2,000 mg total) by mouth See admin instructions. Take four capsules (2000 mg) by mouth one hour prior to dental appointments   aspirin 81 MG EC tablet Take 1 tablet (81 mg total) by mouth daily.   carvedilol 3.125 MG tablet Commonly known as: COREG Take 1 tablet (3.125 mg total) by mouth 2 (two) times daily.   cetirizine 10 MG tablet Commonly known as: ZYRTEC Take 10  mg by mouth as needed for allergies or rhinitis.   ferrous sulfate 325 (65 FE) MG tablet Take 1 tablet (325 mg total) by mouth every other day. What changed: when to take this   freestyle lancets Check BS BID and PRN. DX.E11.9   FreeStyle Libre 14 Day Sensor Misc Inject 1 each into the skin every 14 (fourteen) days. Use as directed.   FreeStyle Lite Devi Use to check BS bid. DX. E11.9   FREESTYLE LITE test strip Generic drug: glucose blood USE TO CHECK BLOOD SUGAR TWICE A DAY AND AS NEEDED   metFORMIN 1000 MG tablet Commonly known as: GLUCOPHAGE Take 1 tablet (1,000 mg total) by mouth 2 (two) times daily with a meal.   metoprolol succinate 50 MG 24 hr tablet Commonly known as: TOPROL-XL Take 1 tablet (50 mg total) by mouth daily. For blood pressure control   nitroGLYCERIN 0.4 MG SL tablet Commonly known as: NITROSTAT Place 1 tablet (0.4 mg total) under the tongue every 5 (five) minutes as needed for chest pain.   NovoLOG FlexPen 100 UNIT/ML FlexPen Generic drug: insulin aspart INJECT 8-14 UNITS  3 times a day with meals What changed:   how much to take  how to take this  when to take this   NovoTwist 32G X 5 MM Misc Generic drug: Insulin Pen Needle USE WITH INSULIN PENS UP TO 5 TIMES DAILY Dx E11.59   olmesartan-hydrochlorothiazide 40-25 MG tablet Commonly known as: Benicar HCT Take 1 tablet by mouth daily.   pantoprazole 40 MG tablet Commonly known as: PROTONIX Take 1 tablet (40 mg total) by mouth 2 (two) times daily after a meal.   Repatha SureClick XX123456 MG/ML Soaj Generic drug: Evolocumab INJECT 140 MG UNDER THE SKIN EVERY 14 DAYS   rosuvastatin 40 MG tablet Commonly known as: CRESTOR Take 1 tablet (40 mg total) by mouth every 3 (three) days.   Tyler Aas FlexTouch 200 UNIT/ML Sopn Generic drug: Insulin Degludec Inject 70 Units into the skin daily.   Trulicity [id number] 0000000 Sopn Generic drug: Dulaglutide Inject 0.75 mg into the skin once a week.        History (reviewed): Past Medical History:  Diagnosis Date  . Allergy   . Arthritis   . CAD (coronary artery disease)   . 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 Cataract   . Diabetes mellitus   . Dysrhythmia    history of heart palpitations   . Gastric polyps 09/18/2017  . GERD (gastroesophageal reflux disease)   . History of frequent urinary tract infections   . History of hiatal hernia   . Hyperlipemia   . Hypertension   . Obesity   . Pancreatitis    Past Surgical History:  Procedure Laterality Date  . CARDIAC CATHETERIZATION     2004 and 1999- done by Dr Percival Spanish   . COLONOSCOPY  11/2003   diverticulosis, no polyps  . CORONARY STENT INTERVENTION N/A 05/28/2018   Procedure: CORONARY STENT INTERVENTION;  Surgeon:  Sherren Mocha, MD;  Location: [facility];  Service: Cardiovascular;  Laterality: N/A;  . EYE SURGERY Bilateral    cateracts - dr Katy Fitch  . HYDROCELE EXCISION / REPAIR    . INTRAVASCULAR PRESSURE WIRE/FFR STUDY N/A 05/28/2018   Procedure: INTRAVASCULAR PRESSURE WIRE/FFR STUDY;  Surgeon: Sherren Mocha, MD;  Location: [facility];  Service: Cardiovascular;  Laterality: N/A;  . left calf surgery      related to trauma of being torn   . LEFT HEART CATH AND CORONARY ANGIOGRAPHY N/A 05/28/2018   Procedure: LEFT HEART CATH AND CORONARY ANGIOGRAPHY;  Surgeon: Sherren Mocha, MD;  Location: [facility];  Service: Cardiovascular;  Laterality: N/A;  . left index finger arthrioscopic surgery     . lymph node removed from right neck     . right shoulder surgery    . TOTAL HIP ARTHROPLASTY Right 03/16/2015   Procedure: RIGHT TOTAL HIP ARTHROPLASTY ANTERIOR APPROACH;  Surgeon: Paralee Cancel, MD;  Location: [facility];  Service: Orthopedics;  Laterality: Right;  . TOTAL HIP ARTHROPLASTY Left 06/22/2015   Procedure: LEFT TOTAL HIP ARTHROPLASTY ANTERIOR APPROACH;  Surgeon: Paralee Cancel, MD;  Location: [facility];  Service: Orthopedics;   Laterality: Left;  . UPPER GASTROINTESTINAL ENDOSCOPY     hiatal hernia 2cm, mild gastritis - 2012, multiple hyperplastic and fundic gland gastric polyps 2019   Family History  Problem Relation Age of Onset  . Heart disease Mother   . Heart attack Mother   . Diabetes Brother   . Heart disease Brother   . Heart attack Brother 55  . Heart attack Father 67  . GI problems Sister        torn esophagus during endoscopy  . Healthy Daughter   . Colon polyps Neg Hx   . Esophageal cancer Neg Hx   . Rectal cancer Neg Hx   . Stomach cancer Neg Hx    Social History   Socioeconomic History  . Marital status: Married    Spouse name: [name]  . Number of children: 1  . Years of education: Some College  . Highest education level: Associate degree: occupational, Hotel manager, or vocational program  Occupational History  . Occupation: Best boy: [employer]    Comment: retired  Tobacco Use  . Smoking status: Former Smoker    Packs/day: 1.00    Years: 10.00    Pack years: 10.00    Types: Cigarettes    Quit date: 04/06/1984    Years since quitting: 35.3  . Smokeless tobacco: Former Systems developer    Types: [place] date: 06/26/1978  Substance and Sexual Activity  . Alcohol use: No  . Drug use: No  . Sexual activity: Yes  Other Topics Concern  . Not on file  Social History Narrative   Married   Retired from [employer]   1 child   Former but no current tobacco   No EtOH/drugs   Social Determinants of Radio broadcast assistant Strain: Low Risk   . Difficulty of Paying Living Expenses: Not hard at all  Food Insecurity: No Food Insecurity  . Worried About Charity fundraiser in the Last Year: Never true  . Ran Out of Food in the Last Year: Never true  Transportation Needs: No Transportation Needs  . Lack of Transportation (Medical): No  . Lack of Transportation (Non-Medical): No  Physical Activity: Sufficiently Active  . Days of Exercise per Week: 5 days  .  Minutes  of Exercise per Session: 60 min  Stress: No Stress Concern Present  . Feeling of Stress : Not at all  Social Connections: Slightly Isolated  . Frequency of Communication with Friends and Family: Once a week  . Frequency of Social Gatherings with Friends and Family: Never  . Attends Religious Services: More than 4 times per year  . Active Member of Clubs or Organizations: Yes  . Attends Archivist Meetings: More than 4 times per year  . Marital Status: Married    Activities of Daily Living In your present state of health, do you have any difficulty performing the following activities: 07/24/2019  Hearing? N  Vision? N  Difficulty concentrating or making decisions? N  Walking or climbing stairs? N  Dressing or bathing? N  Doing errands, shopping? N  Preparing Food and eating ? N  Using the Toilet? N  In the past six months, have you accidently leaked urine? N  Do you have problems with loss of bowel control? N  Managing your Medications? N  Managing your Finances? N  Housekeeping or managing your Housekeeping? N  Some recent data might be hidden    Patient Education/ Literacy How often do you need to have someone help you when you read instructions, pamphlets, or other written materials from your doctor or pharmacy?: 1 - Never What is the last grade level you completed in school?: 2 years of college, Trade School  Exercise Current Exercise Habits: Home exercise routine, Type of exercise: stretching;walking, Time (Minutes): 30, Frequency (Times/Week): 7, Weekly Exercise (Minutes/Week): 210, Intensity: Mild, Exercise limited by: None identified  Diet Patient reports consuming 2 meals a day and 1 snack(s) a day Patient reports that his primary diet is: Diabetic Patient reports that she does have regular access to food.   Depression Screen PHQ 2/9 Scores 07/24/2019 07/24/2019 07/24/2019 07/01/2019 02/20/2019 01/06/2019 08/23/2018  PHQ - 2 Score 0 0 0 0 0 0 0  PHQ- 9 Score - -  - - - - -     Fall Risk Fall Risk  07/24/2019 07/24/2019 07/01/2019 01/16/2019 01/16/2019  Falls in the past year? 0 0 0 0 0  Number falls in past yr: 0 0 - - -  Injury with Fall? 0 0 - - -  Risk for fall due to : History of fall(s) No Fall Risks - - -  Follow up Falls evaluation completed Falls evaluation completed - Falls evaluation completed Falls evaluation completed     Objective:  Jacody Capito seemed alert and oriented and he participated appropriately during our telephone visit.  Blood Pressure Weight BMI  BP Readings from Last 3 Encounters:  07/21/19 (!) 161/89  07/01/19 (!) 155/82  03/28/19 132/77   Wt Readings from Last 3 Encounters:  07/21/19 221 lb 9.6 oz (100.5 kg)  07/01/19 220 lb 3.2 oz (99.9 kg)  03/28/19 208 lb 3.2 oz (94.4 kg)   BMI Readings from Last 1 Encounters:  07/21/19 33.69 kg/m    *Unable to obtain current vital signs, weight, and BMI due to telephone visit type  Hearing/Vision  . Rylend did not seem to have difficulty with hearing/understanding during the telephone conversation . Reports that he has had a formal eye exam by an eye care professional within the past year . Reports that he has not had a formal hearing evaluation within the past year *Unable to fully assess hearing and vision during telephone visit type  Cognitive Function: 6CIT Screen 07/24/2019  What Year?  0 points  What month? 0 points  What time? 0 points  Count back from 20 0 points  Months in reverse 0 points  Repeat phrase 0 points  Total Score 0   (Normal:0-7, Significant for Dysfunction: >8)  Normal Cognitive Function Screening: Yes   Immunization & Health Maintenance Record Immunization History  Administered Date(s) Administered  . Fluad Quad(high Dose 65+) 03/26/2019  . Influenza Whole 02/24/2010  . Influenza, High Dose Seasonal PF 04/15/2015, 04/12/2016, 04/05/2017, 04/16/2018  . Influenza,inj,Quad PF,6+ Mos 03/26/2013, 03/30/2014  . Pneumococcal Conjugate-13  06/17/2013  . Pneumococcal Polysaccharide-23 02/24/2009  . Td 02/24/2006  . Tdap 04/27/2011  . Zoster 07/27/2006  . Zoster Recombinat (Shingrix) 07/22/2018    Health Maintenance  Topic Date Due  . FOOT EXAM  07/11/2019  . HEMOGLOBIN A1C  12/29/2019  . OPHTHALMOLOGY EXAM  03/24/2020  . TETANUS/TDAP  04/26/2021  . INFLUENZA VACCINE  Completed  . Hepatitis C Screening  Completed  . PNA vac Low Risk Adult  Completed       Assessment  This is a routine wellness examination for Marvin Frost.  Health Maintenance: Due or Overdue Health Maintenance Due  Topic Date Due  . FOOT EXAM  07/11/2019    Marvin Frost does not need a referral for Community Assistance: Care Management:   no Social Work:    no Prescription Assistance:  no Nutrition/Diabetes Education:  no   Plan:  Personalized Goals Goals Addressed   None    Personalized Health Maintenance & Screening Recommendations  States he has had 2nd Shingrix.    Lung Cancer Screening Recommended: no (Low Dose CT Chest recommended if Age 75-80 years, 30 pack-year currently smoking OR have quit w/in past 15 years) Hepatitis C Screening recommended: no HIV Screening recommended: no  Advanced Directives: Written information was not prepared per patient's request.  Referrals & Orders No orders of the defined types were placed in this encounter.   Follow-up Plan . Follow-up with [name], MD as planned . Needs Diabetic Foot Exam at Next visit.    I have personally reviewed and noted the following in the patient's chart:   . Medical and social history . Use of alcohol, tobacco or illicit drugs  . Current medications and supplements . Functional ability and status . Nutritional status . Physical activity . Advanced directives . List of other physicians . Hospitalizations, surgeries, and ER visits in previous 12 months . Vitals . Screenings to include cognitive, depression, and falls . Referrals and  appointments  In addition, I have reviewed and discussed with Marvin Frost certain preventive protocols, quality metrics, and best practice recommendations. A written personalized care plan for preventive services as well as general preventive health recommendations is available and can be mailed to the patient at his request.      Wardell Heath, LPN  [phone]

## 2019-07-24 NOTE — Patient Instructions (Signed)
  Marvin Frost , Thank you for taking time to come for your Medicare Wellness Visit. I appreciate your ongoing commitment to your health goals. Please review the following plan we discussed and let me know if I can assist you in the future.   These are the goals we discussed: Goals    . Exercise 3x per week (60 min per time)     Exercise with cardiac rehab and continue once program is finished.        This is a list of the screening recommended for you and due dates:  Health Maintenance  Topic Date Due  . Complete foot exam   07/11/2019  . Hemoglobin A1C  12/29/2019  . Eye exam for diabetics  03/24/2020  . Tetanus Vaccine  04/26/2021  . Flu Shot  Completed  .  Hepatitis C: One time screening is recommended by Center for Disease Control  (CDC) for  adults born from [birth year] through 1965.   Completed  . Pneumonia vaccines  Completed

## 2019-08-22 ENCOUNTER — Encounter: Payer: Self-pay | Admitting: Internal Medicine

## 2019-08-29 ENCOUNTER — Other Ambulatory Visit: Payer: Self-pay | Admitting: "Endocrinology

## 2019-09-07 ENCOUNTER — Other Ambulatory Visit: Payer: Self-pay | Admitting: Cardiology

## 2019-09-11 ENCOUNTER — Other Ambulatory Visit: Payer: Self-pay | Admitting: Family Medicine

## 2019-09-11 DIAGNOSIS — I10 Essential (primary) hypertension: Secondary | ICD-10-CM

## 2019-09-16 ENCOUNTER — Ambulatory Visit (AMBULATORY_SURGERY_CENTER): Payer: Self-pay

## 2019-09-16 ENCOUNTER — Other Ambulatory Visit: Payer: Self-pay

## 2019-09-16 VITALS — Temp 97.1°F | Ht 68.0 in | Wt 218.0 lb

## 2019-09-16 DIAGNOSIS — K317 Polyp of stomach and duodenum: Secondary | ICD-10-CM

## 2019-09-16 NOTE — Progress Notes (Signed)

## 2019-09-25 ENCOUNTER — Other Ambulatory Visit: Payer: Self-pay | Admitting: Cardiology

## 2019-09-25 ENCOUNTER — Other Ambulatory Visit: Payer: Self-pay | Admitting: *Deleted

## 2019-09-25 DIAGNOSIS — E1169 Type 2 diabetes mellitus with other specified complication: Secondary | ICD-10-CM

## 2019-09-25 DIAGNOSIS — Z8679 Personal history of other diseases of the circulatory system: Secondary | ICD-10-CM

## 2019-09-25 MED ORDER — REPATHA SURECLICK 140 MG/ML ~~LOC~~ SOAJ: 140.0000 mg | SUBCUTANEOUS | 4 refills | Status: DC

## 2019-09-30 ENCOUNTER — Encounter: Payer: Self-pay | Admitting: Internal Medicine

## 2019-09-30 ENCOUNTER — Ambulatory Visit (AMBULATORY_SURGERY_CENTER): Payer: Medicare Other | Admitting: Internal Medicine

## 2019-09-30 ENCOUNTER — Other Ambulatory Visit: Payer: Self-pay

## 2019-09-30 VITALS — BP 96/56 | HR 75 | Temp 96.9°F | Resp 19 | Ht 68.0 in | Wt 218.0 lb

## 2019-09-30 DIAGNOSIS — K449 Diaphragmatic hernia without obstruction or gangrene: Secondary | ICD-10-CM

## 2019-09-30 DIAGNOSIS — K3189 Other diseases of stomach and duodenum: Secondary | ICD-10-CM

## 2019-09-30 DIAGNOSIS — K317 Polyp of stomach and duodenum: Secondary | ICD-10-CM | POA: Diagnosis present

## 2019-09-30 MED ORDER — SODIUM CHLORIDE 0.9 % IV SOLN: 500.0000 mL | Freq: Once | INTRAVENOUS | Status: DC

## 2019-09-30 NOTE — Progress Notes (Signed)
Pt's states no medical or surgical changes since previsit or office visit.  TempJB, VS DT

## 2019-09-30 NOTE — Progress Notes (Signed)
Called to room to assist during endoscopic procedure.  Patient ID and intended procedure confirmed with present staff. Received instructions for my participation in the procedure from the performing physician.  

## 2019-09-30 NOTE — Op Note (Addendum)
[facility] Patient Name: Marvin Frost Procedure Date: 09/30/2019 2:06 PM MRN: [id number] Endoscopist: Gatha Mayer , MD Age: 74 Referring MD:  Date of Birth: 05/02/1946 Gender: Male Account #: [id number] Procedure:                Upper GI endoscopy Indications:              Gastric polyps, Follow-up of gastric polyps Medicines:                Propofol per Anesthesia, Monitored Anesthesia Care Procedure:                Pre-Anesthesia Assessment:                           - Prior to the procedure, a History and Physical                            was performed, and patient medications and                            allergies were reviewed. The patient's tolerance of                            previous anesthesia was also reviewed. The risks                            and benefits of the procedure and the sedation                            options and risks were discussed with the patient.                            All questions were answered, and informed consent                            was obtained. Prior Anticoagulants: The patient has                            taken no previous anticoagulant or antiplatelet                            agents. ASA Grade Assessment: III - A patient with                            severe systemic disease. After reviewing the risks                            and benefits, the patient was deemed in                            satisfactory condition to undergo the procedure.                           After obtaining informed consent, the endoscope was  passed under direct vision. Throughout the                            procedure, the patient's blood pressure, pulse, and                            oxygen saturations were monitored continuously. The                            Endoscope was introduced through the mouth, and                            advanced to the second part of duodenum. The upper            GI endoscopy was accomplished without difficulty.                            The patient tolerated the procedure well. Scope In: Scope Out: Findings:                 Multiple diminutive semi-sessile polyps with no                            stigmata of recent bleeding were found in the                            gastric fundus and in the gastric body. Biopsies                            were taken with a cold forceps for histology.                            Verification of patient identification for the                            specimen was done. Estimated blood loss was minimal.                           Two 2 to 4 mm sessile polyps were found in the                            cardia and the area around and inferior to these                            was inflamed (see photo) - friable, contact heme,                            white plaques. Biopsies were taken with a cold                            forceps for histology - polyps and inflammation.                            Verification of patient  identification for the                            specimen was done. Estimated blood loss was minimal.                           A small hiatal hernia was present.                           The exam was otherwise without abnormality.                           The cardia and gastric fundus were normal on                            retroflexion. Complications:            No immediate complications. Estimated Blood Loss:     Estimated blood loss was minimal. Impression:               - Multiple gastric polyps. Biopsied. These look                            like fundic gland polyps and are all diminutruve.                            Representative biopsies.                           - Two gastric polyps and inflammatory changes in                            cardia. Biopsied. Also SMALL AVM                           - Small hiatal hernia.                           - The examination was  otherwise normal. Hx fundic                            gland and hyperplastic gastric polyps last exam 2019 Recommendation:           - Patient has a contact number available for                            emergencies. The signs and symptoms of potential                            delayed complications were discussed with the                            patient. Return to normal activities tomorrow.                            Written discharge instructions were provided to the  patient.                           - Resume previous diet.                           - Continue present medications.                           - Await pathology results. Gatha Mayer, MD 09/30/2019 2:40:46 PM This report has been signed electronically.

## 2019-09-30 NOTE — Progress Notes (Signed)
To PACU, VSS. Report to [email or web address].tb 

## 2019-09-30 NOTE — Patient Instructions (Addendum)
You still have stomach polyps - not a surprise. The majority look totally innocent and not precancerous. I took biopsies.  I also took biopsies at the top of the stomach where there were some polyps and abnormal lining that looks inflamed.  Once I see the results will let you know.  I appreciate the opportunity to care for you. Gatha Mayer, MD, [facility]  Please read handouts provided. Continue present medications. Await pathology results.    YOU HAD AN ENDOSCOPIC PROCEDURE TODAY AT [facility]:   Refer to the procedure report that was given to you for any specific questions about what was found during the examination.  If the procedure report does not answer your questions, please call your gastroenterologist to clarify.  If you requested that your care partner not be given the details of your procedure findings, then the procedure report has been included in a sealed envelope for you to review at your convenience later.  YOU SHOULD EXPECT: Some feelings of bloating in the abdomen. Passage of more gas than usual.  Walking can help get rid of the air that was put into your GI tract during the procedure and reduce the bloating. If you had a lower endoscopy (such as a colonoscopy or flexible sigmoidoscopy) you may notice spotting of blood in your stool or on the toilet paper. If you underwent a bowel prep for your procedure, you may not have a normal bowel movement for a few days.  Please Note:  You might notice some irritation and congestion in your nose or some drainage.  This is from the oxygen used during your procedure.  There is no need for concern and it should clear up in a day or so.  SYMPTOMS TO REPORT IMMEDIATELY:     Following upper endoscopy (EGD)  Vomiting of blood or coffee ground material  New chest pain or pain under the shoulder blades  Painful or persistently difficult swallowing  New shortness of breath  Fever of 100F or higher  Black, tarry-looking  stools  For urgent or emergent issues, a gastroenterologist can be reached at any hour by calling [phone]. Do not use MyChart messaging for urgent concerns.    DIET:  We do recommend a small meal at first, but then you may proceed to your regular diet.  Drink plenty of fluids but you should avoid alcoholic beverages for 24 hours.  ACTIVITY:  You should plan to take it easy for the rest of today and you should NOT DRIVE or use heavy machinery until tomorrow (because of the sedation medicines used during the test).    FOLLOW UP: Our staff will call the number listed on your records 48-72 hours following your procedure to check on you and address any questions or concerns that you may have regarding the information given to you following your procedure. If we do not reach you, we will leave a message.  We will attempt to reach you two times.  During this call, we will ask if you have developed any symptoms of COVID 19. If you develop any symptoms (ie: fever, flu-like symptoms, shortness of breath, cough etc.) before then, please call [phone].  If you test positive for Covid 19 in the 2 weeks post procedure, please call and report this information to [place].    If any biopsies were taken you will be contacted by phone or by letter within the next 1-3 weeks.  Please call us at [phone] if you have  not heard about the biopsies in 3 weeks.    SIGNATURES/CONFIDENTIALITY: You and/or your care partner have signed paperwork which will be entered into your electronic medical record.  These signatures attest to the fact that that the information above on your After Visit Summary has been reviewed and is understood.  Full responsibility of the confidentiality of this discharge information lies with you and/or your care-partner.

## 2019-10-02 ENCOUNTER — Telehealth: Payer: Self-pay

## 2019-10-02 NOTE — Telephone Encounter (Signed)
  Follow up Call-  Call back number 09/30/2019 09/13/2017  Post procedure Call Back phone  # [phone]  Permission to leave phone message Yes Yes  Some recent data might be hidden     Patient questions:  Do you have a fever, pain , or abdominal swelling? No. Pain Score  0 *  Have you tolerated food without any problems? Yes.    Have you been able to return to your normal activities? Yes.    Do you have any questions about your discharge instructions: Diet   No. Medications  No. Follow up visit  No.  Do you have questions or concerns about your Care? No.  Actions: * If pain score is 4 or above: No action needed, pain <4.   1. Have you developed a fever since your procedure? No  2.   Have you had an respiratory symptoms (SOB or cough) since your procedure? No  3.   Have you tested positive for COVID 19 since your procedure No  4.   Have you had any family members/close contacts diagnosed with the COVID 19 since your procedure?  No   If yes to any of these questions please route to Joylene John, RN and Erenest Rasher, RN

## 2019-10-07 ENCOUNTER — Encounter: Payer: Self-pay | Admitting: Internal Medicine

## 2019-11-19 ENCOUNTER — Other Ambulatory Visit: Payer: Self-pay

## 2019-11-19 ENCOUNTER — Ambulatory Visit (INDEPENDENT_AMBULATORY_CARE_PROVIDER_SITE_OTHER): Payer: Medicare Other | Admitting: "Endocrinology

## 2019-11-19 ENCOUNTER — Telehealth: Payer: Self-pay | Admitting: "Endocrinology

## 2019-11-19 ENCOUNTER — Encounter: Payer: Self-pay | Admitting: "Endocrinology

## 2019-11-19 VITALS — BP 136/79 | HR 78 | Ht 68.0 in | Wt 217.6 lb

## 2019-11-19 DIAGNOSIS — E782 Mixed hyperlipidemia: Secondary | ICD-10-CM

## 2019-11-19 DIAGNOSIS — I10 Essential (primary) hypertension: Secondary | ICD-10-CM

## 2019-11-19 DIAGNOSIS — E1159 Type 2 diabetes mellitus with other circulatory complications: Secondary | ICD-10-CM

## 2019-11-19 DIAGNOSIS — Z9861 Coronary angioplasty status: Secondary | ICD-10-CM

## 2019-11-19 DIAGNOSIS — I2510 Atherosclerotic heart disease of native coronary artery without angina pectoris: Secondary | ICD-10-CM

## 2019-11-19 LAB — POCT GLYCOSYLATED HEMOGLOBIN (HGB A1C): Hemoglobin A1C: 7.3 % — AB (ref 4.0–5.6)

## 2019-11-19 MED ORDER — ROSUVASTATIN CALCIUM 40 MG PO TABS: 40.0000 mg | ORAL_TABLET | ORAL | 1 refills | Status: DC

## 2019-11-19 MED ORDER — NOVOLOG FLEXPEN 100 UNIT/ML ~~LOC~~ SOPN: 8.0000 [IU] | PEN_INJECTOR | Freq: Three times a day (TID) | SUBCUTANEOUS | 1 refills | Status: DC

## 2019-11-19 MED ORDER — TRULICITY 1.5 MG/0.5ML ~~LOC~~ SOAJ
1.5000 mg | SUBCUTANEOUS | 1 refills | Status: DC
Start: 2019-11-19 — End: 2020-03-24

## 2019-11-19 MED ORDER — METOPROLOL SUCCINATE ER 50 MG PO TB24: ORAL_TABLET | ORAL | 1 refills | Status: DC

## 2019-11-19 MED ORDER — FREESTYLE LIBRE 14 DAY SENSOR MISC: 1.0000 | 2 refills | Status: DC

## 2019-11-19 MED ORDER — TRESIBA FLEXTOUCH 200 UNIT/ML ~~LOC~~ SOPN: 60.0000 [IU] | PEN_INJECTOR | Freq: Every day | SUBCUTANEOUS | 3 refills | Status: DC

## 2019-11-19 MED ORDER — METFORMIN HCL 1000 MG PO TABS: 1000.0000 mg | ORAL_TABLET | Freq: Two times a day (BID) | ORAL | 1 refills | Status: DC

## 2019-11-19 NOTE — Patient Instructions (Signed)

## 2019-11-19 NOTE — Progress Notes (Signed)
11/19/2019, 6:38 PM     Endocrinology follow-up note   Subjective:    Patient ID: Marvin Frost, male    DOB: September 09, 1945.  Marvin Frost is being seen in follow-up after he was seen in consultation for management of currently uncontrolled symptomatic diabetes. -He also has dyslipidemia, hypertension. PMD:   [name], MD.   Past Medical History:  Diagnosis Date  . Allergy    seasonal  . Anemia    IDA  . Arthritis   . CAD (coronary artery disease)   . 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 Cataract    both removed  . Diabetes mellitus 1995  . Dysrhythmia    history of heart palpitations   . Gastric polyps 09/18/2017  . GERD (gastroesophageal reflux disease)   . History of frequent urinary tract infections   . History of hiatal hernia   . Hyperlipemia   . Hypertension   . Obesity   . Pancreatitis     Past Surgical History:  Procedure Laterality Date  . CARDIAC CATHETERIZATION     2004 and 1999- done by Dr Percival Spanish   . COLONOSCOPY  11/2003   diverticulosis, no polyps  . CORONARY STENT INTERVENTION N/A 05/28/2018   Procedure: CORONARY STENT INTERVENTION;  Surgeon: Sherren Mocha, MD;  Location: [facility];  Service: Cardiovascular;  Laterality: N/A;  . EYE SURGERY Bilateral    cateracts - dr Katy Fitch  . HYDROCELE EXCISION / REPAIR    . INTRAVASCULAR PRESSURE WIRE/FFR STUDY N/A 05/28/2018   Procedure: INTRAVASCULAR PRESSURE WIRE/FFR STUDY;  Surgeon: Sherren Mocha, MD;  Location: [facility];  Service: Cardiovascular;  Laterality: N/A;  . left calf surgery      related to trauma of being torn   . LEFT HEART CATH AND CORONARY ANGIOGRAPHY N/A 05/28/2018   Procedure: LEFT HEART CATH AND CORONARY ANGIOGRAPHY;  Surgeon: Sherren Mocha, MD;  Location: [facility];  Service: Cardiovascular;  Laterality: N/A;  . left index finger arthrioscopic  surgery     . lymph node removed from right neck     . right shoulder surgery    . TOTAL HIP ARTHROPLASTY Right 03/16/2015   Procedure: RIGHT TOTAL HIP ARTHROPLASTY ANTERIOR APPROACH;  Surgeon: Paralee Cancel, MD;  Location: [facility];  Service: Orthopedics;  Laterality: Right;  . TOTAL HIP ARTHROPLASTY Left 06/22/2015   Procedure: LEFT TOTAL HIP ARTHROPLASTY ANTERIOR APPROACH;  Surgeon: Paralee Cancel, MD;  Location: [facility];  Service: Orthopedics;  Laterality: Left;  . UPPER GASTROINTESTINAL ENDOSCOPY     hiatal hernia 2cm, mild gastritis - 2012, multiple hyperplastic and fundic gland gastric polyps 2019    Social History   Socioeconomic History  . Marital status: Married    Spouse name: [name]  . Number of children: 1  . Years of education: Some College  . Highest education level: Associate degree: occupational, Hotel manager, or vocational program  Occupational History  . Occupation: Best boy: [employer]    Comment: retired  Tobacco Use  . Smoking status: Former Smoker  Packs/day: 1.00    Years: 10.00    Pack years: 10.00    Types: Cigarettes    Quit date: 04/06/1984    Years since quitting: 35.6  . Smokeless tobacco: Former Systems developer    Types: [place] date: 06/26/1978  Substance and Sexual Activity  . Alcohol use: No  . Drug use: No  . Sexual activity: Yes  Other Topics Concern  . Not on file  Social History Narrative   Married   Retired from [employer]   1 child   Former but no current tobacco   No EtOH/drugs   Social Determinants of Radio broadcast assistant Strain: Low Risk   . Difficulty of Paying Living Expenses: Not hard at all  Food Insecurity: No Food Insecurity  . Worried About Charity fundraiser in the Last Year: Never true  . Ran Out of Food in the Last Year: Never true  Transportation Needs: No Transportation Needs  . Lack of Transportation (Medical): No  . Lack of Transportation (Non-Medical): No  Physical Activity: Sufficiently  Active  . Days of Exercise per Week: 5 days  . Minutes of Exercise per Session: 60 min  Stress: No Stress Concern Present  . Feeling of Stress : Not at all  Social Connections: Slightly Isolated  . Frequency of Communication with Friends and Family: Once a week  . Frequency of Social Gatherings with Friends and Family: Never  . Attends Religious Services: More than 4 times per year  . Active Member of Clubs or Organizations: Yes  . Attends Archivist Meetings: More than 4 times per year  . Marital Status: Married    Family History  Problem Relation Age of Onset  . Heart disease Mother   . Heart attack Mother   . Diabetes Brother   . Heart disease Brother   . Heart attack Brother 59  . Heart attack Father 33  . GI problems Sister        torn esophagus during endoscopy  . Healthy Daughter   . Colon polyps Neg Hx   . Esophageal cancer Neg Hx   . Rectal cancer Neg Hx   . Stomach cancer Neg Hx   . Colon cancer Neg Hx     Outpatient Encounter Medications as of 11/19/2019  Medication Sig  . amoxicillin (AMOXIL) 500 MG capsule Take 4 capsules (2,000 mg total) by mouth See admin instructions. Take four capsules (2000 mg) by mouth one hour prior to dental appointments  . aspirin EC 81 MG EC tablet Take 1 tablet (81 mg total) by mouth daily.  . Blood Glucose Monitoring Suppl (FREESTYLE LITE) DEVI Use to check BS bid. DX. E11.9  . carvedilol (COREG) 3.125 MG tablet TAKE 1 TABLET TWICE A DAY  . cetirizine (ZYRTEC) 10 MG tablet Take 10 mg by mouth as needed for allergies or rhinitis.   . Continuous Blood Gluc Sensor (FREESTYLE LIBRE 14 DAY SENSOR) MISC Inject 1 each into the skin every 14 (fourteen) days. Use as directed.  . Dulaglutide (TRULICITY) 1.5 0000000 SOPN Inject 1.5 mg into the skin once a week.  . Evolocumab (REPATHA SURECLICK) XX123456 MG/ML SOAJ Inject 140 mg into the skin every 14 (fourteen) days.  . ferrous sulfate 325 (65 FE) MG tablet Take 1 tablet (325 mg total) by  mouth every other day. (Patient taking differently: Take 325 mg by mouth every morning. )  . glucose blood (FREESTYLE LITE) test strip USE TO CHECK BLOOD  SUGAR TWICE A DAY AND AS NEEDED  . insulin aspart (NOVOLOG FLEXPEN) 100 UNIT/ML FlexPen Inject 8-14 Units into the skin 3 (three) times daily before meals.  . insulin degludec (TRESIBA FLEXTOUCH) 200 UNIT/ML FlexTouch Pen Inject 60 Units into the skin daily.  . Insulin Pen Needle (NOVOTWIST) 32G X 5 MM MISC USE WITH INSULIN PENS UP TO 5 TIMES DAILY Dx E11.59  . Lancets (FREESTYLE) lancets Check BS BID and PRN. DX.E11.9  . metFORMIN (GLUCOPHAGE) 1000 MG tablet Take 1 tablet (1,000 mg total) by mouth 2 (two) times daily with a meal.  . metoprolol succinate (TOPROL-XL) 50 MG 24 hr tablet TAKE 1 TABLET DAILY FOR BLOOD PRESSURE CONTROL  . nitroGLYCERIN (NITROSTAT) 0.4 MG SL tablet DISSOLVE 1 TABLET UNDER THE TONGUE EVERY 5 MINUTES AS NEEDED FOR CHEST PAIN (Patient not taking: Reported on 09/30/2019)  . olmesartan-hydrochlorothiazide (BENICAR HCT) 40-25 MG tablet Take 1 tablet by mouth daily.  . pantoprazole (PROTONIX) 40 MG tablet Take 1 tablet (40 mg total) by mouth 2 (two) times daily after a meal.  . rosuvastatin (CRESTOR) 40 MG tablet Take 1 tablet (40 mg total) by mouth every 3 (three) days.  . [DISCONTINUED] Continuous Blood Gluc Sensor (FREESTYLE LIBRE 14 DAY SENSOR) MISC Inject 1 each into the skin every 14 (fourteen) days. Use as directed.  . [DISCONTINUED] Dulaglutide (TRULICITY) A999333 0000000 SOPN Inject 0.75 mg into the skin once a week.  . [DISCONTINUED] insulin aspart (NOVOLOG FLEXPEN) 100 UNIT/ML FlexPen INJECT 8-14 UNITS  3 times a day with meals (Patient taking differently: Inject 18-24 Units into the skin 3 (three) times daily with meals. INJECT 8-14 UNITS  3 times a day with meals)  . [DISCONTINUED] Insulin Degludec (TRESIBA FLEXTOUCH) 200 UNIT/ML SOPN Inject 70 Units into the skin daily.  . [DISCONTINUED] metFORMIN (GLUCOPHAGE) 1000 MG  tablet TAKE 1 TABLET TWICE A DAY WITH MEALS  . [DISCONTINUED] metoprolol succinate (TOPROL-XL) 50 MG 24 hr tablet TAKE 1 TABLET DAILY FOR BLOOD PRESSURE CONTROL  . [DISCONTINUED] rosuvastatin (CRESTOR) 40 MG tablet Take 1 tablet (40 mg total) by mouth every 3 (three) days.   No facility-administered encounter medications on file as of 11/19/2019.    ALLERGIES: Allergies  Allergen Reactions  . Brilinta [Ticagrelor]     Rash and SOB   . Liraglutide Other (See Comments)    Abdominal pain and enlarged pancreas on CT ? pancreatitis  . Baxinets [Cetylpyridinium-Benzocaine] Other (See Comments)    Unknown reaction  . Celebrex [Celecoxib] Nausea Only  . Toprol Xl [Metoprolol Succinate] Other (See Comments)    Unknown reaction - pt states it did not work  . Triplex Ad Other (See Comments)    Abdominal pain and enlarged pancreas on CT? pancreatitis  . Welchol [Colesevelam Hcl] Other (See Comments)    constipation  . Norvasc [Amlodipine Besylate] Rash  . Statins Other (See Comments)    muscle and joint pain if taken for long periods of time, takes Crestor every 2-3 days to avoid this reaction    VACCINATION STATUS: Immunization History  Administered Date(s) Administered  . Fluad Quad(high Dose 65+) 03/26/2019  . Influenza Whole 02/24/2010  . Influenza, High Dose Seasonal PF 04/15/2015, 04/12/2016, 04/05/2017, 04/16/2018  . Influenza,inj,Quad PF,6+ Mos 03/26/2013, 03/30/2014  . Pneumococcal Conjugate-13 06/17/2013  . Pneumococcal Polysaccharide-23 02/24/2009  . Td 02/24/2006  . Tdap 04/27/2011  . Zoster 07/27/2006  . Zoster Recombinat (Shingrix) 07/22/2018    Diabetes He presents for his follow-up diabetic visit. He has type 2 diabetes  mellitus. Onset time: He was diagnosed at approximate age of 78 years. His disease course has been improving. There are no hypoglycemic associated symptoms. Pertinent negatives for hypoglycemia include no confusion, headaches, pallor or seizures.  Pertinent negatives for diabetes include no chest pain, no fatigue, no polydipsia, no polyphagia, no polyuria and no weakness. There are no hypoglycemic complications. Symptoms are improving. Diabetic complications include heart disease. Risk factors for coronary artery disease include diabetes mellitus, dyslipidemia, family history, male sex, hypertension, sedentary lifestyle, tobacco exposure and obesity. Current diabetic treatment includes insulin injections and oral agent (monotherapy). He is compliant with treatment most of the time. His weight is increasing steadily. He is following a generally unhealthy diet. When asked about meal planning, he reported none. He participates in exercise intermittently. His breakfast blood glucose range is generally 130-140 mg/dl. His lunch blood glucose range is generally 140-180 mg/dl. His dinner blood glucose range is generally 140-180 mg/dl. His bedtime blood glucose range is generally 140-180 mg/dl. His overall blood glucose range is 140-180 mg/dl. (His CGM analysis shows 84% time in range, 14% above range, and 0% hypoglycemia.  His average blood glucose is 146for the last 2 weeks with POC  A1c of 7.3%. ) An ACE inhibitor/angiotensin II receptor blocker is being taken. Eye exam is current.  Hyperlipidemia This is a chronic problem. The current episode started more than 1 year ago. The problem is controlled. Exacerbating diseases include diabetes and obesity. Pertinent negatives include no chest pain, myalgias or shortness of breath. Current antihyperlipidemic treatment includes statins (He is on Crestor 20 mg p.o. 2 to 3 days a week, Repatha 140 mg every 4 weeks.). The current treatment provides moderate improvement of lipids. Risk factors for coronary artery disease include male sex, obesity, family history, dyslipidemia and diabetes mellitus.  Hypertension This is a chronic problem. The current episode started more than 1 year ago. The problem is controlled.  Pertinent negatives include no chest pain, headaches, neck pain, palpitations or shortness of breath. Risk factors for coronary artery disease include dyslipidemia, diabetes mellitus, family history, male gender, obesity, sedentary lifestyle and smoking/tobacco exposure. Past treatments include angiotensin blockers. Hypertensive end-organ damage includes CAD/MI.    Review of systems  Constitutional: + Minimally fluctuating body weight,  current  Body mass index is 33.09 kg/m. , no fatigue, no subjective hyperthermia, no subjective hypothermia Eyes: no blurry vision, no xerophthalmia ENT: no sore throat, no nodules palpated in throat, no dysphagia/odynophagia, no hoarseness Cardiovascular: no Chest Pain, no Shortness of Breath, no palpitations, no leg swelling Respiratory: no cough, no shortness of breath Gastrointestinal: no Nausea/Vomiting/Diarhhea Musculoskeletal: no muscle/joint aches Skin: no rashes, no hyperemia Neurological: no tremors, no numbness, no tingling, no dizziness Psychiatric: no depression, no anxiety   Objective:    BP 136/79   Pulse 78   Ht 5\' 8"  (1.727 m)   Wt 217 lb 9.6 oz (98.7 kg)   BMI 33.09 kg/m   Wt Readings from Last 3 Encounters:  11/19/19 217 lb 9.6 oz (98.7 kg)  09/30/19 218 lb (98.9 kg)  09/16/19 218 lb (98.9 kg)      Physical Exam- Limited  Constitutional:  Body mass index is 33.09 kg/m. , not in acute distress, normal state of mind Eyes:  EOMI, no exophthalmos Neck: Supple Thyroid: No gross goiter Respiratory: Adequate breathing efforts Musculoskeletal: no gross deformities, strength intact in all four extremities, no gross restriction of joint movements Skin:  no rashes, no hyperemia Neurological: no tremor with outstretched hands,  CMP     Component Value Date/Time   NA 139 07/01/2019 1500   K 4.3 07/01/2019 1500   CL 101 07/01/2019 1500   CO2 25 07/01/2019 1500   GLUCOSE 134 (H) 07/01/2019 1500   GLUCOSE 154 (H) 05/29/2018  0217   BUN 11 07/01/2019 1500   CREATININE 0.78 07/01/2019 1500   CREATININE 0.95 12/31/2012 0857   CALCIUM 8.9 07/01/2019 1500   PROT 6.1 07/01/2019 1500   ALBUMIN 4.0 07/01/2019 1500   AST 11 07/01/2019 1500   ALT 13 07/01/2019 1500   ALKPHOS 54 07/01/2019 1500   BILITOT 0.2 07/01/2019 1500   GFRNONAA 88 07/01/2019 1500   GFRNONAA 81 12/31/2012 0857   GFRAA 102 07/01/2019 1500   GFRAA >89 12/31/2012 0857    Diabetic Labs (most recent): Lab Results  Component Value Date   HGBA1C 7.3 (A) 11/19/2019   HGBA1C 8.0 (H) 07/01/2019   HGBA1C 7.2 (H) 03/14/2019     Lipid Panel ( most recent) Lipid Panel     Component Value Date/Time   CHOL 127 07/01/2019 1500   CHOL 157 12/31/2012 0857   TRIG 132 07/01/2019 1500   TRIG 271 (H) 02/17/2016 0824   TRIG 184 (H) 12/31/2012 0857   HDL 47 07/01/2019 1500   HDL 44 02/17/2016 0824   HDL 45 12/31/2012 0857   CHOLHDL 2.7 07/01/2019 1500   LDLCALC 57 07/01/2019 1500   LDLCALC 52 01/19/2014 1007   LDLCALC 75 12/31/2012 0857   LDLDIRECT 39 11/02/2016 0806      Lab Results  Component Value Date   TSH 2.290 09/03/2014     Assessment & Plan:   1. DM type 2 causing vascular disease ([place])  - Cesare Newingham has currently uncontrolled symptomatic type 2 DM since  74 years of age.  His CGM analysis shows 84% time in range, 14% above range, and 0% hypoglycemia.  His average blood glucose is 135 for the last 2 weeks with POC A1c of 7.3%.   -I have reviewed his recent labs with him. - I had a long discussion with him about the progressive nature of diabetes and the pathology behind its complications. -his diabetes is complicated by coronary artery disease, obesity and he remains at a high risk for more acute and chronic complications which include CAD, CVA, CKD, retinopathy, and neuropathy. These are all discussed in detail with him.  - I have counseled him on diet management and weight loss, by adopting a carbohydrate restricted/protein  rich diet.  - he  admits there is a room for improvement in his diet and drink choices. -  Suggestion is made for him to avoid simple carbohydrates  from his diet including Cakes, Sweet Desserts / Pastries, Ice Cream, Soda (diet and regular), Sweet Tea, Candies, Chips, Cookies, Sweet Pastries,  Store Bought Juices, Alcohol in Excess of  1-2 drinks a day, Artificial Sweeteners, Coffee Creamer, and "Sugar-free" Products. This will help patient to have stable blood glucose profile and potentially avoid unintended weight gain.  - I encouraged him to switch to  unprocessed or minimally processed complex starch and increased protein intake (animal or plant source), fruits, and vegetables.  - he is advised to stick to a routine mealtimes to eat 3 meals  a day and avoid unnecessary snacks ( to snack only to correct hypoglycemia).   - he is  scheduled with Jearld Fenton, RDN, CDE for individualized diabetes education.  - I have approached him with the following individualized plan to  manage diabetes and patient agrees:   -Given his high risk profile, he will continue to benefit from intensive treatment with basal/bolus insulin.  -He is advised to continue Tresiba 60 units nightly, continue NovoLog 8 units units units 3 times a day with meals  for pre-meal BG readings of 90-150mg /dl, plus patient specific correction dose for unexpected hyperglycemia above 150mg /dl, associated with strict monitoring of glucose 4 times a day-before meals and at bedtime. - he is warned not to take insulin without proper monitoring per orders. - Adjustment parameters are given to him for hypo and hyperglycemia in writing. - he is encouraged to call clinic for blood glucose levels less than 70 or above 200 mg /dl. -He is advised to continue Metformin 1000 mg p.o. twice daily-after breakfast and after supper. -He is benefiting from incretin therapy.  I discussed and increase his Trulicity to 1.5 mg subcutaneously weekly.      - Patient specific target  A1c;  LDL, HDL, Triglycerides,  were discussed in detail.  2) Blood Pressure /Hypertension:   His blood pressure is controlled to target.   he is advised to continue his current medications including Benicar/HCTZ 40/25 mg p.o. daily with breakfast , and metoprolol 50 mg daily.  3) Lipids/Hyperlipidemia:   Review of his recent lipid panel showed  controlled  LDL at 57.  He is benefiting from statin as well as his Repatha.  He is advised to continue Crestor 20 mg p.o. every other day, continue Repatha 140 mg subcutaneously every 4 weeks.  Side effects and precautions discussed with him.  4)  Weight/Diet: His BMI is 71-   he is a candidate for weight loss.  I discussed with him the fact that loss of 5 - 10% of his  current body weight will have the most impact on his diabetes management.  CDE Consult will be initiated . Exercise, and detailed carbohydrates information provided  -  detailed on discharge instructions.  5) Chronic Care/Health Maintenance:  -he  is on ACEI/ARB and Statin medications and  is encouraged to initiate and continue to follow up with Ophthalmology, Dentist,  Podiatrist at least yearly or according to recommendations, and advised to  stay away from smoking. I have recommended yearly flu vaccine and pneumonia vaccine at least every 5 years; moderate intensity exercise for up to 150 minutes weekly; and  sleep for at least 7 hours a day.  - he is  advised to maintain close follow up with [name], MD for primary care needs, as well as his other providers for optimal and coordinated care.  - Time spent on this patient care encounter:  40 min, of which > 50% was spent in  counseling and the rest reviewing his blood glucose logs , discussing his hypoglycemia and hyperglycemia episodes, reviewing his current and  previous labs / studies  ( including abstraction from other facilities) and medications  doses and developing a  long term treatment plan  and documenting his care.   Please refer to Patient Instructions for Blood Glucose Monitoring and Insulin/Medications Dosing Guide"  in media tab for additional information. Please  also refer to " Patient Self Inventory" in the Media  tab for reviewed elements of pertinent patient history.  Marvin Frost participated in the discussions, expressed understanding, and voiced agreement with the above plans.  All questions were answered to his satisfaction. he is encouraged to contact clinic should he have any questions or concerns prior to his return visit.  Follow up plan: - Return in about 4 months (around 03/21/2020) for F/U with Pre-visit Labs, Meter, Logs, A1c here., NV Office Urine MA, NV A1c in Office.  Glade Lloyd, MD [facility] [address] [place], [place] [address] Phone: [phone]  Fax: [phone]    11/19/2019, 6:38 PM  This note was partially dictated with voice recognition software. Similar sounding words can be transcribed inadequately or may not  be corrected upon review.

## 2019-11-19 NOTE — Telephone Encounter (Signed)
Can you change order to [facility]

## 2019-11-19 NOTE — Telephone Encounter (Signed)
Lab orders changed to [facility] 

## 2019-11-20 ENCOUNTER — Other Ambulatory Visit: Payer: Self-pay | Admitting: "Endocrinology

## 2019-11-20 DIAGNOSIS — E1159 Type 2 diabetes mellitus with other circulatory complications: Secondary | ICD-10-CM

## 2019-12-03 ENCOUNTER — Telehealth: Payer: Self-pay | Admitting: Cardiology

## 2019-12-03 ENCOUNTER — Telehealth: Payer: Self-pay | Admitting: "Endocrinology

## 2019-12-03 NOTE — Telephone Encounter (Signed)
Patient called requesting a refill for his rosuvastatin. I informed him the medication was prescribed by a different office and to contact them for refills. Patient agreed.

## 2019-12-03 NOTE — Telephone Encounter (Signed)
Patient is calling about the following prescription and states that [facility] and is being told that the medication has been canceled by Dr. Dorris Fetch. Looks like on my end it was called in 5/26. Please advise.   rosuvastatin (CRESTOR) 40 MG tablet

## 2019-12-04 ENCOUNTER — Other Ambulatory Visit: Payer: Self-pay | Admitting: "Endocrinology

## 2019-12-04 DIAGNOSIS — E782 Mixed hyperlipidemia: Secondary | ICD-10-CM

## 2019-12-04 MED ORDER — ROSUVASTATIN CALCIUM 40 MG PO TABS: 40.0000 mg | ORAL_TABLET | ORAL | 1 refills | Status: DC

## 2019-12-04 NOTE — Telephone Encounter (Signed)
I did not cancel Crestor. I will refresh his Rx.

## 2019-12-04 NOTE — Telephone Encounter (Signed)
Spoke with pt and advised that it was refreshed in the system.

## 2019-12-18 ENCOUNTER — Telehealth: Payer: Self-pay | Admitting: Cardiology

## 2019-12-18 DIAGNOSIS — E782 Mixed hyperlipidemia: Secondary | ICD-10-CM

## 2019-12-18 MED ORDER — ROSUVASTATIN CALCIUM 40 MG PO TABS: 40.0000 mg | ORAL_TABLET | ORAL | 0 refills | Status: DC

## 2019-12-18 NOTE — Telephone Encounter (Signed)
New message   Patient needs a new prescription for rosuvastatin (CRESTOR) 40 MG tablet sent to [place], [place]

## 2019-12-30 ENCOUNTER — Ambulatory Visit (INDEPENDENT_AMBULATORY_CARE_PROVIDER_SITE_OTHER): Payer: Medicare Other | Admitting: Family Medicine

## 2019-12-30 ENCOUNTER — Encounter: Payer: Self-pay | Admitting: Family Medicine

## 2019-12-30 ENCOUNTER — Other Ambulatory Visit: Payer: Self-pay

## 2019-12-30 VITALS — BP 155/78 | HR 72 | Temp 98.0°F | Resp 20 | Ht 68.0 in | Wt 215.1 lb

## 2019-12-30 DIAGNOSIS — I2510 Atherosclerotic heart disease of native coronary artery without angina pectoris: Secondary | ICD-10-CM

## 2019-12-30 DIAGNOSIS — I10 Essential (primary) hypertension: Secondary | ICD-10-CM | POA: Diagnosis not present

## 2019-12-30 DIAGNOSIS — Z9861 Coronary angioplasty status: Secondary | ICD-10-CM

## 2019-12-30 DIAGNOSIS — R002 Palpitations: Secondary | ICD-10-CM | POA: Diagnosis not present

## 2019-12-30 DIAGNOSIS — E782 Mixed hyperlipidemia: Secondary | ICD-10-CM | POA: Diagnosis not present

## 2019-12-30 DIAGNOSIS — K219 Gastro-esophageal reflux disease without esophagitis: Secondary | ICD-10-CM

## 2019-12-30 DIAGNOSIS — E1159 Type 2 diabetes mellitus with other circulatory complications: Secondary | ICD-10-CM | POA: Diagnosis not present

## 2019-12-30 MED ORDER — TERBINAFINE HCL 1 % EX CREA: 1.0000 "application " | TOPICAL_CREAM | Freq: Two times a day (BID) | CUTANEOUS | 0 refills | Status: DC

## 2019-12-30 MED ORDER — METFORMIN HCL 1000 MG PO TABS: 1000.0000 mg | ORAL_TABLET | Freq: Two times a day (BID) | ORAL | 1 refills | Status: DC

## 2019-12-30 MED ORDER — ROSUVASTATIN CALCIUM 40 MG PO TABS: 40.0000 mg | ORAL_TABLET | ORAL | 0 refills | Status: DC

## 2019-12-30 MED ORDER — OLMESARTAN MEDOXOMIL-HCTZ 40-25 MG PO TABS: 1.0000 | ORAL_TABLET | Freq: Every day | ORAL | 3 refills | Status: DC

## 2019-12-30 MED ORDER — METOPROLOL SUCCINATE ER 100 MG PO TB24: ORAL_TABLET | ORAL | 1 refills | Status: DC

## 2019-12-30 NOTE — Progress Notes (Signed)
Subjective:  Patient ID: Marvin Frost,  male    DOB: 10/09/1945  Age: 74 y.o.    CC: Medical Management of Chronic Issues   HPI Marvin Frost presents for  follow-up of hypertension. Patient has no history of headache chest pain or shortness of breath or recent cough. Patient also denies symptoms of TIA such as numbness weakness lateralizing. Patient denies side effects from medication. States taking it regularly.  Patient tells me that his palpitations are much better.  He still has occasional palpitation but no chest pain.  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  Patient tells me that his blood pressure at home runs around 144/72.  Lamisil cream  Patient also  in for follow-up of elevated cholesterol. Doing well without complaints on current medication. Denies side effects  including myalgia and arthralgia and nausea. Also in today for liver function testing. Currently no chest pain, shortness of breath or other cardiovascular related symptoms noted Follow-up of diabetes. Patient does check blood sugar at home. Readings were placed on a graft by the patient's [facility] receiver.  This showed that his fasting numbers were good.  His peaks 2 hours after breakfast just a little bit above the recommended range on average.  After lunch there was actually dipped down into normal and then a peak again mildly over the recommended range after supper.  Patient says that Dr. Dorris Fetch has doubled his dose of Trulicity.  He has not started it yet. Patient denies symptoms such as excessive hunger or urinary frequency, excessive hunger, nausea No significant hypoglycemic spells noted. Medications reviewed. Pt reports taking them regularly. Pt. denies complication/adverse reaction today.    History Marvin Frost has a past medical history of Allergy, Anemia, Arthritis, CAD (coronary artery disease), Cancer ([place]), Cataract, Diabetes mellitus (1995), Dysrhythmia, Gastric polyps (09/18/2017), GERD (gastroesophageal  reflux disease), History of frequent urinary tract infections, History of hiatal hernia, Hyperlipemia, Hypertension, Obesity, and Pancreatitis.   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   His family history includes Diabetes in his brother; GI problems in his sister; Healthy in his daughter; Heart attack in his mother; Heart attack (age of onset: 15) in his father; Heart attack (age of onset: 40) in his brother; Heart disease in his brother and mother.He reports that he quit smoking about 35 years ago. His smoking use included cigarettes. He has a 10.00 pack-year smoking history. He quit smokeless tobacco use about 41 years ago.  His smokeless tobacco use included chew. He reports that he does not drink alcohol and does not use drugs.  Current Outpatient Medications on File Prior to Visit  Medication Sig Dispense Refill  . amoxicillin (AMOXIL) 500 MG capsule Take 4 capsules (2,000 mg total) by mouth See admin instructions. Take four capsules (2000 mg) by mouth one hour prior to dental appointments 4 capsule 10  . aspirin EC 81 MG EC tablet Take 1 tablet (81 mg total) by mouth daily.    . Blood Glucose Monitoring Suppl (FREESTYLE LITE) DEVI Use to check BS bid. DX. E11.9 1 each 0  . cetirizine (ZYRTEC)  10 MG tablet Take 10 mg by mouth as needed for allergies or rhinitis.     . Continuous Blood Gluc Sensor (FREESTYLE LIBRE 14 DAY SENSOR) MISC Inject 1 each into the skin every 14 (fourteen) days. Use as  directed. 6 each 2  . Dulaglutide (TRULICITY) 1.5 VH/8.4ON SOPN Inject 1.5 mg into the skin once a week. 12 pen 1  . Evolocumab (REPATHA SURECLICK) 629 MG/ML SOAJ Inject 140 mg into the skin every 14 (fourteen) days. 6 mL 4  . ferrous sulfate 325 (65 FE) MG tablet Take 1 tablet (325 mg total) by mouth every other day. (Patient taking differently: Take 325 mg by mouth every morning. ) 90 tablet 1  . insulin aspart (NOVOLOG FLEXPEN) 100 UNIT/ML FlexPen Inject 8-14 Units into the skin 3 (three) times daily before meals. 10 pen 1  . Insulin Pen Needle (NOVOTWIST) 32G X 5 MM MISC USE WITH INSULIN PENS UP TO 5 TIMES DAILY Dx E11.59 500 each 4  . Lancets (FREESTYLE) lancets Check BS BID and PRN. DX.E11.9 300 each 3  . nitroGLYCERIN (NITROSTAT) 0.4 MG SL tablet DISSOLVE 1 TABLET UNDER THE TONGUE EVERY 5 MINUTES AS NEEDED FOR CHEST PAIN 25 tablet 11  . pantoprazole (PROTONIX) 40 MG tablet Take 1 tablet (40 mg total) by mouth 2 (two) times daily after a meal. 180 tablet 3  . TRESIBA FLEXTOUCH 200 UNIT/ML FlexTouch Pen INJECT 70 UNITS UNDER THE SKIN DAILY 27 mL 3   No current facility-administered medications on file prior to visit.    ROS Review of Systems  Constitutional: Negative.   HENT: Negative.   Eyes: Negative for visual disturbance.  Respiratory: Negative for cough and shortness of breath.   Cardiovascular: Negative for chest pain and leg swelling.  Gastrointestinal: Negative for abdominal pain, diarrhea, nausea and vomiting.  Genitourinary: Positive for genital sores (This is actually more of a rash.  He is treated for tinea crura's of the glans chronically.  He is not circumcised.  The previous medicine has quit working.  He would like to have something new or different.). Negative for difficulty urinating, penile pain, penile swelling and scrotal swelling.  Musculoskeletal: Negative for arthralgias and myalgias.  Skin: Negative for rash.  Neurological: Negative for headaches.    Psychiatric/Behavioral: Negative for sleep disturbance.    Objective:  BP (!) 155/78   Pulse 72   Temp 98 F (36.7 C) (Temporal)   Resp 20   Ht _0  (1.727 m)   Wt 215 lb 2 oz (97.6 kg)   SpO2 96%   BMI 32.71 kg/m   BP Readings from Last 3 Encounters:  12/30/19 (!) 155/78  11/19/19 136/79  09/30/19 (!) 96/56    Wt Readings from Last 3 Encounters:  12/30/19 215 lb 2 oz (97.6 kg)  11/19/19 217 lb 9.6 oz (98.7 kg)  09/30/19 218 lb (98.9 kg)     Physical Exam Vitals reviewed.  Constitutional:      Appearance: He is well-developed.  HENT:     Head: Normocephalic and atraumatic.     Right Ear: Tympanic membrane and external ear normal. No decreased hearing noted.     Left Ear: Tympanic membrane and external ear normal. No decreased hearing noted.     Mouth/Throat:     Pharynx: No oropharyngeal exudate or posterior oropharyngeal erythema.  Eyes:     Pupils: Pupils are equal, round, and reactive to light.  Cardiovascular:     Rate and Rhythm: Normal rate and regular rhythm.  Heart sounds: No murmur heard.   Pulmonary:     Effort: No respiratory distress.     Breath sounds: Normal breath sounds.  Abdominal:     General: Bowel sounds are normal.     Palpations: Abdomen is soft. There is no mass.     Tenderness: There is no abdominal tenderness.  Musculoskeletal:     Cervical back: Normal range of motion and neck supple.     Diabetic Foot Exam - Simple   No data filed        Assessment & Plan:   Danyael was seen today for medical management of chronic issues.  Diagnoses and all orders for this visit:  Essential hypertension, benign -     CBC with Differential/Platelet -     CMP14+EGFR -     Lipid panel -     TSH -     metoprolol succinate (TOPROL-XL) 100 MG 24 hr tablet; TAKE 1 TABLET DAILY FOR BLOOD PRESSURE CONTROL  DM type 2 causing vascular disease (HCC) -     Microalbumin / creatinine urine ratio -     Cancel: Bayer DCA Hb A1c Waived -      CBC with Differential/Platelet -     CMP14+EGFR -     Lipid panel -     TSH  Mixed hyperlipidemia -     CBC with Differential/Platelet -     CMP14+EGFR -     Lipid panel -     rosuvastatin (CRESTOR) 40 MG tablet; Take 1 tablet (40 mg total) by mouth every 3 (three) days. NEED OV.  Palpitations -     TSH  CAD S/P percutaneous coronary angioplasty -     TSH  Gastroesophageal reflux disease without esophagitis  Other orders -     olmesartan-hydrochlorothiazide (BENICAR HCT) 40-25 MG tablet; Take 1 tablet by mouth daily. -     metFORMIN (GLUCOPHAGE) 1000 MG tablet; Take 1 tablet (1,000 mg total) by mouth 2 (two) times daily with a meal.   I have discontinued Jeneen Rinks Russaw's carvedilol. I have also changed his metoprolol succinate. Additionally, I am having him maintain his FreeStyle Lite, cetirizine, freestyle, aspirin, ferrous sulfate, NovoTwist, pantoprazole, amoxicillin, nitroGLYCERIN, Repatha SureClick, Trulicity, NovoLOG FlexPen, [employer] 14 Day Sensor, [place] FlexTouch, olmesartan-hydrochlorothiazide, metFORMIN, and rosuvastatin.  Meds ordered this encounter  Medications  . metoprolol succinate (TOPROL-XL) 100 MG 24 hr tablet    Sig: TAKE 1 TABLET DAILY FOR BLOOD PRESSURE CONTROL    Dispense:  90 tablet    Refill:  1  . olmesartan-hydrochlorothiazide (BENICAR HCT) 40-25 MG tablet    Sig: Take 1 tablet by mouth daily.    Dispense:  90 tablet    Refill:  3  . metFORMIN (GLUCOPHAGE) 1000 MG tablet    Sig: Take 1 tablet (1,000 mg total) by mouth 2 (two) times daily with a meal.    Dispense:  180 tablet    Refill:  1  . rosuvastatin (CRESTOR) 40 MG tablet    Sig: Take 1 tablet (40 mg total) by mouth every 3 (three) days. NEED OV.    Dispense:  90 tablet    Refill:  0     Follow-up: Return in about 6 months (around 07/01/2020) for [facility] physical.  [name], M.D.

## 2019-12-31 LAB — LIPID PANEL
Chol/HDL Ratio: 3.3 ratio (ref 0.0–5.0)
Cholesterol, Total: 121 mg/dL (ref 100–199)
HDL: 37 mg/dL — ABNORMAL LOW (ref >39)
LDL Chol Calc (NIH): 56 mg/dL (ref 0–99)
Triglycerides: 162 mg/dL — ABNORMAL HIGH (ref 0–149)
VLDL Cholesterol Cal: 28 mg/dL (ref 5–40)

## 2019-12-31 LAB — CMP14+EGFR
ALT: 11 IU/L (ref 0–44)
AST: 9 IU/L (ref 0–40)
Albumin/Globulin Ratio: 1.8 (ref 1.2–2.2)
Albumin: 3.9 g/dL (ref 3.7–4.7)
Alkaline Phosphatase: 55 IU/L (ref 48–121)
BUN/Creatinine Ratio: 12 (ref 10–24)
BUN: 12 mg/dL (ref 8–27)
Bilirubin Total: 0.4 mg/dL (ref 0.0–1.2)
CO2: 24 mmol/L (ref 20–29)
Calcium: 9.1 mg/dL (ref 8.6–10.2)
Chloride: 101 mmol/L (ref 96–106)
Creatinine, Ser: 1.01 mg/dL (ref 0.76–1.27)
GFR calc Af Amer: 83 mL/min/{1.73_m2} (ref >59)
GFR calc non Af Amer: 72 mL/min/{1.73_m2} (ref >59)
Globulin, Total: 2.2 g/dL (ref 1.5–4.5)
Glucose: 140 mg/dL — ABNORMAL HIGH (ref 65–99)
Potassium: 4.3 mmol/L (ref 3.5–5.2)
Sodium: 142 mmol/L (ref 134–144)
Total Protein: 6.1 g/dL (ref 6.0–8.5)

## 2019-12-31 LAB — CBC WITH DIFFERENTIAL/PLATELET
Basophils Absolute: 0.1 10*3/uL (ref 0.0–0.2)
Basos: 2 %
EOS (ABSOLUTE): 0.6 10*3/uL — ABNORMAL HIGH (ref 0.0–0.4)
Eos: 7 %
Hematocrit: 43.1 % (ref 37.5–51.0)
Hemoglobin: 14.4 g/dL (ref 13.0–17.7)
Immature Grans (Abs): 0.1 10*3/uL (ref 0.0–0.1)
Immature Granulocytes: 1 %
Lymphocytes Absolute: 1.4 10*3/uL (ref 0.7–3.1)
Lymphs: 17 %
MCH: 29.4 pg (ref 26.6–33.0)
MCHC: 33.4 g/dL (ref 31.5–35.7)
MCV: 88 fL (ref 79–97)
Monocytes Absolute: 0.7 10*3/uL (ref 0.1–0.9)
Monocytes: 9 %
Neutrophils Absolute: 5.2 10*3/uL (ref 1.4–7.0)
Neutrophils: 64 %
Platelets: 222 10*3/uL (ref 150–450)
RBC: 4.9 x10E6/uL (ref 4.14–5.80)
RDW: 13.8 % (ref 11.6–15.4)
WBC: 8.1 10*3/uL (ref 3.4–10.8)

## 2019-12-31 LAB — SPECIMEN STATUS REPORT

## 2019-12-31 LAB — MICROALBUMIN / CREATININE URINE RATIO
Creatinine, Urine: 160.3 mg/dL
Microalb/Creat Ratio: 6 mg/g creat (ref 0–29)
Microalbumin, Urine: 10.3 ug/mL

## 2019-12-31 LAB — TSH: TSH: 2.72 u[IU]/mL (ref 0.450–4.500)

## 2019-12-31 NOTE — Progress Notes (Signed)
Hello Marvin Frost,  Your lab result is normal and/or stable.Some minor variations that are not significant are commonly marked abnormal, but do not represent any medical problem for you.  Best regards, Tenicia Gural, M.D.

## 2020-03-24 ENCOUNTER — Other Ambulatory Visit: Payer: Self-pay

## 2020-03-24 ENCOUNTER — Encounter: Payer: Self-pay | Admitting: Nurse Practitioner

## 2020-03-24 ENCOUNTER — Ambulatory Visit (INDEPENDENT_AMBULATORY_CARE_PROVIDER_SITE_OTHER): Payer: Medicare Other | Admitting: Nurse Practitioner

## 2020-03-24 VITALS — BP 122/75 | HR 75 | Ht 68.0 in | Wt 210.0 lb

## 2020-03-24 DIAGNOSIS — E1159 Type 2 diabetes mellitus with other circulatory complications: Secondary | ICD-10-CM | POA: Diagnosis not present

## 2020-03-24 DIAGNOSIS — Z9861 Coronary angioplasty status: Secondary | ICD-10-CM

## 2020-03-24 DIAGNOSIS — I2510 Atherosclerotic heart disease of native coronary artery without angina pectoris: Secondary | ICD-10-CM

## 2020-03-24 DIAGNOSIS — I10 Essential (primary) hypertension: Secondary | ICD-10-CM

## 2020-03-24 DIAGNOSIS — E782 Mixed hyperlipidemia: Secondary | ICD-10-CM

## 2020-03-24 LAB — POCT GLYCOSYLATED HEMOGLOBIN (HGB A1C): Hemoglobin A1C: 6.8 % — AB (ref 4.0–5.6)

## 2020-03-24 MED ORDER — TRULICITY 1.5 MG/0.5ML ~~LOC~~ SOAJ: 1.5000 mg | SUBCUTANEOUS | 3 refills | Status: DC

## 2020-03-24 MED ORDER — METFORMIN HCL ER (MOD) 1000 MG PO TB24: 1000.0000 mg | ORAL_TABLET | Freq: Every day | ORAL | 3 refills | Status: DC

## 2020-03-24 NOTE — Progress Notes (Signed)
03/24/2020, 3:13 PM     Endocrinology follow-up note   Subjective:    Patient ID: Marvin Frost, male    DOB: [birth year].  Marvin Frost is being seen in follow-up after he was seen in consultation for management of currently uncontrolled symptomatic diabetes. -He also has dyslipidemia, hypertension. PMD:   [name], MD.   Past Medical History:  Diagnosis Date  . Allergy    seasonal  . Anemia    IDA  . Arthritis   . CAD (coronary artery disease)   . 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 Cataract    both removed  . Diabetes mellitus 1995  . Dysrhythmia    history of heart palpitations   . Gastric polyps 09/18/2017  . GERD (gastroesophageal reflux disease)   . History of frequent urinary tract infections   . History of hiatal hernia   . Hyperlipemia   . Hypertension   . Obesity   . Pancreatitis     Past Surgical History:  Procedure Laterality Date  . CARDIAC CATHETERIZATION     2004 and 1999- done by Dr Percival Spanish   . COLONOSCOPY  11/2003   diverticulosis, no polyps  . CORONARY STENT INTERVENTION N/A 05/28/2018   Procedure: CORONARY STENT INTERVENTION;  Surgeon: Sherren Mocha, MD;  Location: [facility];  Service: Cardiovascular;  Laterality: N/A;  . EYE SURGERY Bilateral    cateracts - dr Katy Fitch  . HYDROCELE EXCISION / REPAIR    . INTRAVASCULAR PRESSURE WIRE/FFR STUDY N/A 05/28/2018   Procedure: INTRAVASCULAR PRESSURE WIRE/FFR STUDY;  Surgeon: Sherren Mocha, MD;  Location: [facility];  Service: Cardiovascular;  Laterality: N/A;  . left calf surgery      related to trauma of being torn   . LEFT HEART CATH AND CORONARY ANGIOGRAPHY N/A 05/28/2018   Procedure: LEFT HEART CATH AND CORONARY ANGIOGRAPHY;  Surgeon: Sherren Mocha, MD;  Location: [facility];  Service: Cardiovascular;  Laterality: N/A;  . left index finger arthrioscopic  surgery     . lymph node removed from right neck     . right shoulder surgery    . TOTAL HIP ARTHROPLASTY Right 03/16/2015   Procedure: RIGHT TOTAL HIP ARTHROPLASTY ANTERIOR APPROACH;  Surgeon: Paralee Cancel, MD;  Location: [facility];  Service: Orthopedics;  Laterality: Right;  . TOTAL HIP ARTHROPLASTY Left 06/22/2015   Procedure: LEFT TOTAL HIP ARTHROPLASTY ANTERIOR APPROACH;  Surgeon: Paralee Cancel, MD;  Location: [facility];  Service: Orthopedics;  Laterality: Left;  . UPPER GASTROINTESTINAL ENDOSCOPY     hiatal hernia 2cm, mild gastritis - 2012, multiple hyperplastic and fundic gland gastric polyps 2019    Social History   Socioeconomic History  . Marital status: Married    Spouse name: Marvin Frost  . Number of children: 1  . Years of education: Some College  . Highest education level: Associate degree: occupational, Hotel manager, or vocational program  Occupational History  . Occupation: Best boy: [employer]    Comment: retired  Tobacco Use  . Smoking status: Former Smoker  Packs/day: 1.00    Years: 10.00    Pack years: 10.00    Types: Cigarettes    Quit date: 04/06/1984    Years since quitting: 35.9  . Smokeless tobacco: Former Systems developer    Types: [place] date: 06/26/1978  Vaping Use  . Vaping Use: Never used  Substance and Sexual Activity  . Alcohol use: No  . Drug use: No  . Sexual activity: Yes  Other Topics Concern  . Not on file  Social History Narrative   Married   Retired from [employer]   1 child   Former but no current tobacco   No EtOH/drugs   Social Determinants of Radio broadcast assistant Strain: Low Risk   . Difficulty of Paying Living Expenses: Not hard at all  Food Insecurity: No Food Insecurity  . Worried About Charity fundraiser in the Last Year: Never true  . Ran Out of Food in the Last Year: Never true  Transportation Needs: No Transportation Needs  . Lack of Transportation (Medical): No  . Lack of Transportation (Non-Medical):  No  Physical Activity: Sufficiently Active  . Days of Exercise per Week: 5 days  . Minutes of Exercise per Session: 60 min  Stress: No Stress Concern Present  . Feeling of Stress : Not at all  Social Connections: Moderately Integrated  . Frequency of Communication with Friends and Family: Once a week  . Frequency of Social Gatherings with Friends and Family: Never  . Attends Religious Services: More than 4 times per year  . Active Member of Clubs or Organizations: Yes  . Attends Archivist Meetings: More than 4 times per year  . Marital Status: Married    Family History  Problem Relation Age of Onset  . Heart disease Mother   . Heart attack Mother   . Diabetes Brother   . Heart disease Brother   . Heart attack Brother 76  . Heart attack Father 65  . GI problems Sister        torn esophagus during endoscopy  . Healthy Daughter   . Colon polyps Neg Hx   . Esophageal cancer Neg Hx   . Rectal cancer Neg Hx   . Stomach cancer Neg Hx   . Colon cancer Neg Hx     Outpatient Encounter Medications as of 03/24/2020  Medication Sig  . amoxicillin (AMOXIL) 500 MG capsule Take 4 capsules (2,000 mg total) by mouth See admin instructions. Take four capsules (2000 mg) by mouth one hour prior to dental appointments  . aspirin EC 81 MG EC tablet Take 1 tablet (81 mg total) by mouth daily.  . Blood Glucose Monitoring Suppl (FREESTYLE LITE) DEVI Use to check BS bid. DX. E11.9  . cetirizine (ZYRTEC) 10 MG tablet Take 10 mg by mouth as needed for allergies or rhinitis.   . Continuous Blood Gluc Sensor (FREESTYLE LIBRE 14 DAY SENSOR) MISC Inject 1 each into the skin every 14 (fourteen) days. Use as directed.  . Dulaglutide (TRULICITY) 1.5 XL/2.4MW SOPN Inject 1.5 mg into the skin once a week.  . Evolocumab (REPATHA SURECLICK) 102 MG/ML SOAJ Inject 140 mg into the skin every 14 (fourteen) days.  . ferrous sulfate 325 (65 FE) MG tablet Take 1 tablet (325 mg total) by mouth every other day.  (Patient taking differently: Take 325 mg by mouth every morning. )  . Insulin Pen Needle (NOVOTWIST) 32G X 5 MM MISC USE WITH INSULIN PENS UP  TO 5 TIMES DAILY Dx E11.59  . Lancets (FREESTYLE) lancets Check BS BID and PRN. DX.E11.9  . metoprolol succinate (TOPROL-XL) 100 MG 24 hr tablet TAKE 1 TABLET DAILY FOR BLOOD PRESSURE CONTROL  . nitroGLYCERIN (NITROSTAT) 0.4 MG SL tablet DISSOLVE 1 TABLET UNDER THE TONGUE EVERY 5 MINUTES AS NEEDED FOR CHEST PAIN  . olmesartan-hydrochlorothiazide (BENICAR HCT) 40-25 MG tablet Take 1 tablet by mouth daily.  . pantoprazole (PROTONIX) 40 MG tablet Take 1 tablet (40 mg total) by mouth 2 (two) times daily after a meal. (Patient taking differently: Take 40 mg by mouth daily. )  . rosuvastatin (CRESTOR) 40 MG tablet Take 1 tablet (40 mg total) by mouth every 3 (three) days. NEED OV.  . terbinafine (LAMISIL) 1 % cream Apply 1 application topically 2 (two) times daily.  . TRESIBA FLEXTOUCH 200 UNIT/ML FlexTouch Pen INJECT 70 UNITS UNDER THE SKIN DAILY  . [DISCONTINUED] Dulaglutide (TRULICITY) 1.5 MG/8.6PY SOPN Inject 1.5 mg into the skin once a week.  . [DISCONTINUED] insulin aspart (NOVOLOG FLEXPEN) 100 UNIT/ML FlexPen Inject 8-14 Units into the skin 3 (three) times daily before meals.  . [DISCONTINUED] metFORMIN (GLUCOPHAGE) 1000 MG tablet Take 1 tablet (1,000 mg total) by mouth 2 (two) times daily with a meal.  . metFORMIN (GLUMETZA) 1000 MG (MOD) 24 hr tablet Take 1 tablet (1,000 mg total) by mouth daily with breakfast.   No facility-administered encounter medications on file as of 03/24/2020.    ALLERGIES: Allergies  Allergen Reactions  . Brilinta [Ticagrelor]     Rash and SOB   . Liraglutide Other (See Comments)    Abdominal pain and enlarged pancreas on CT ? pancreatitis  . Baxinets [Cetylpyridinium-Benzocaine] Other (See Comments)    Unknown reaction  . Celebrex [Celecoxib] Nausea Only  . Toprol Xl [Metoprolol Succinate] Other (See Comments)     Unknown reaction - pt states it did not work  . Triplex Ad Other (See Comments)    Abdominal pain and enlarged pancreas on CT? pancreatitis  . Welchol [Colesevelam Hcl] Other (See Comments)    constipation  . Norvasc [Amlodipine Besylate] Rash  . Statins Other (See Comments)    muscle and joint pain if taken for long periods of time, takes Crestor every 2-3 days to avoid this reaction    VACCINATION STATUS: Immunization History  Administered Date(s) Administered  . Fluad Quad(high Dose 65+) 03/26/2019  . Influenza Whole 02/24/2010  . Influenza, High Dose Seasonal PF 04/15/2015, 04/12/2016, 04/05/2017, 04/16/2018  . Influenza,inj,Quad PF,6+ Mos 03/26/2013, 03/30/2014  . Pneumococcal Conjugate-13 06/17/2013  . Pneumococcal Polysaccharide-23 02/24/2009  . Td 02/24/2006  . Tdap 04/27/2011  . Zoster 07/27/2006  . Zoster Recombinat (Shingrix) 07/22/2018    Diabetes He presents for his follow-up diabetic visit. He has type 2 diabetes mellitus. Onset time: He was diagnosed at approximate age of 26 years. His disease course has been improving. There are no hypoglycemic associated symptoms. Pertinent negatives for hypoglycemia include no confusion, headaches, pallor or seizures. Pertinent negatives for diabetes include no chest pain, no fatigue, no polydipsia, no polyphagia, no polyuria and no weakness. There are no hypoglycemic complications. Symptoms are improving. Diabetic complications include heart disease. Risk factors for coronary artery disease include diabetes mellitus, dyslipidemia, family history, male sex, hypertension, sedentary lifestyle, tobacco exposure and obesity. Current diabetic treatment includes insulin injections, oral agent (monotherapy) and intensive insulin program. He is compliant with treatment most of the time. His weight is decreasing steadily. He is following a generally unhealthy diet. When asked  about meal planning, he reported none. He has not had a previous visit  with a dietitian. He participates in exercise intermittently. His home blood glucose trend is decreasing steadily. His breakfast blood glucose range is generally 90-110 mg/dl. His lunch blood glucose range is generally 110-130 mg/dl. His dinner blood glucose range is generally 130-140 mg/dl. His bedtime blood glucose range is generally 140-180 mg/dl. (Patient presents today with his CGM showing TIR 89%, TAR 7%, TBR 4%.  His POCT A1C today is 6.8%, improving from last visit of 7.3%.  He does have frequent mild episodes of fasting hypoglycemia noted.  He has adjusted his dose of Tresiba at times to accommodate for those low readings.) An ACE inhibitor/angiotensin II receptor blocker is being taken. He does not see a podiatrist.Eye exam is current.  Hyperlipidemia This is a chronic problem. The current episode started more than 1 year ago. The problem is controlled. Recent lipid tests were reviewed and are normal. Exacerbating diseases include diabetes and obesity. Factors aggravating his hyperlipidemia include beta blockers. Pertinent negatives include no chest pain, myalgias or shortness of breath. Current antihyperlipidemic treatment includes statins (Repatha every 2 weeks.). The current treatment provides moderate improvement of lipids. Compliance problems include medication side effects.  Risk factors for coronary artery disease include male sex, obesity, family history, dyslipidemia, diabetes mellitus, hypertension and a sedentary lifestyle.  Hypertension This is a chronic problem. The current episode started more than 1 year ago. The problem has been gradually improving since onset. The problem is controlled. Pertinent negatives include no chest pain, headaches, neck pain, palpitations or shortness of breath. There are no associated agents to hypertension. Risk factors for coronary artery disease include dyslipidemia, diabetes mellitus, family history, male gender, obesity, sedentary lifestyle and  smoking/tobacco exposure. Past treatments include angiotensin blockers, diuretics and beta blockers. The current treatment provides moderate improvement. There are no compliance problems.  Hypertensive end-organ damage includes CAD/MI.    Review of systems  Constitutional: + Minimally fluctuating body weight,  current Body mass index is 31.93 kg/m. , no fatigue, no subjective hyperthermia, no subjective hypothermia Eyes: no blurry vision, no xerophthalmia ENT: no sore throat, no nodules palpated in throat, no dysphagia/odynophagia, no hoarseness Cardiovascular: no chest pain, no shortness of breath, no palpitations, no leg swelling Respiratory: no cough, no shortness of breath Gastrointestinal: no nausea/vomiting/diarrhea Musculoskeletal: no muscle/joint aches Skin: no rashes, no hyperemia Neurological: no tremors, no numbness, no tingling, no dizziness Psychiatric: no depression, no anxiety   Objective:    BP 122/75 (BP Location: Right Arm, Patient Position: Sitting)   Pulse 75   Ht 5\' 8"  (1.727 m)   Wt 210 lb (95.3 kg)   BMI 31.93 kg/m   Wt Readings from Last 3 Encounters:  03/24/20 210 lb (95.3 kg)  12/30/19 215 lb 2 oz (97.6 kg)  11/19/19 217 lb 9.6 oz (98.7 kg)    BP Readings from Last 3 Encounters:  03/24/20 122/75  12/30/19 (!) 155/78  11/19/19 136/79     Physical Exam- Limited  Constitutional:  Body mass index is 31.93 kg/m. , not in acute distress, normal state of mind Eyes:  EOMI, no exophthalmos Neck: Supple Thyroid: No gross goiter Cardiovascular: RRR, no murmers, rubs, or gallops, no edema Respiratory: Adequate breathing efforts, no crackles, rales, rhonchi, or wheezing Musculoskeletal: no gross deformities, strength intact in all four extremities, no gross restriction of joint movements Skin:  no rashes, no hyperemia, varicose veins noted to BLE Neurological: no tremor with outstretched hands  Foot exam:   No rashes, ulcers, cuts, calluses,  onychodystrophy.   Good pulses bilat.  Good sensation to 10 g monofilament bilat.  POCT ABI Results 03/24/20   Right ABI:  1.25      Left ABI:  1.37  Right leg systolic / diastolic: 326/71 mmHg Left leg systolic / diastolic: 245/80 mmHg  Arm systolic / diastolic: 998/33 mmHG Detailed report will be scanned into chart.  CMP     Component Value Date/Time   NA 142 12/30/2019 0804   K 4.3 12/30/2019 0804   CL 101 12/30/2019 0804   CO2 24 12/30/2019 0804   GLUCOSE 140 (H) 12/30/2019 0804   GLUCOSE 154 (H) 05/29/2018 0217   BUN 12 12/30/2019 0804   CREATININE 1.01 12/30/2019 0804   CREATININE 0.95 12/31/2012 0857   CALCIUM 9.1 12/30/2019 0804   PROT 6.1 12/30/2019 0804   ALBUMIN 3.9 12/30/2019 0804   AST 9 12/30/2019 0804   ALT 11 12/30/2019 0804   ALKPHOS 55 12/30/2019 0804   BILITOT 0.4 12/30/2019 0804   GFRNONAA 72 12/30/2019 0804   GFRNONAA 81 12/31/2012 0857   GFRAA 83 12/30/2019 0804   GFRAA >89 12/31/2012 0857    Diabetic Labs (most recent): Lab Results  Component Value Date   HGBA1C 7.3 (A) 11/19/2019   HGBA1C 8.0 (H) 07/01/2019   HGBA1C 7.2 (H) 03/14/2019     Lipid Panel ( most recent) Lipid Panel     Component Value Date/Time   CHOL 121 12/30/2019 0804   CHOL 157 12/31/2012 0857   TRIG 162 (H) 12/30/2019 0804   TRIG 271 (H) 02/17/2016 0824   TRIG 184 (H) 12/31/2012 0857   HDL 37 (L) 12/30/2019 0804   HDL 44 02/17/2016 0824   HDL 45 12/31/2012 0857   CHOLHDL 3.3 12/30/2019 0804   LDLCALC 56 12/30/2019 0804   LDLCALC 52 01/19/2014 1007   LDLCALC 75 12/31/2012 0857   LDLDIRECT 39 11/02/2016 0806      Lab Results  Component Value Date   TSH 2.720 12/30/2019   TSH 2.290 09/03/2014     Assessment & Plan:   1. DM type 2 causing vascular disease ([place])  - Clebert Wenger has currently uncontrolled symptomatic type 2 DM since  74 years of age.  Patient presents today with his CGM showing TIR 89%, TAR 7%, TBR 4%.  His POCT A1C today is 6.8%,  improving from last visit of 7.3%.  He does have frequent mild episodes of fasting hypoglycemia noted.  He has adjusted his dose of Tresiba at times to accommodate for those low readings.  -I have reviewed his recent labs with him.  - I had a long discussion with him about the progressive nature of diabetes and the pathology behind its complications. -his diabetes is complicated by coronary artery disease, obesity and he remains at a high risk for more acute and chronic complications which include CAD, CVA, CKD, retinopathy, and neuropathy. These are all discussed in detail with him.  - I have counseled him on diet management and weight loss, by adopting a carbohydrate restricted/protein rich diet.  - The patient admits there is a room for improvement in their diet and drink choices. -  Suggestion is made for the patient to avoid simple carbohydrates from their diet including Cakes, Sweet Desserts / Pastries, Ice Cream, Soda (diet and regular), Sweet Tea, Candies, Chips, Cookies, Sweet Pastries,  Store Bought Juices, Alcohol in Excess of  1-2 drinks a day, Artificial Sweeteners, Coffee Creamer,  and "Sugar-free" Products. This will help patient to have stable blood glucose profile and potentially avoid unintended weight gain.   - I encouraged the patient to switch to  unprocessed or minimally processed complex starch and increased protein intake (animal or plant source), fruits, and vegetables.   - Patient is advised to stick to a routine mealtimes to eat 3 meals  a day and avoid unnecessary snacks ( to snack only to correct hypoglycemia).  - he is scheduled with Jearld Fenton, RDN, CDE for individualized diabetes education.  - I have approached him with the following individualized plan to manage diabetes and patient agrees:   - He has done well without use of bolus insulin and is advised to discontinue use at this time.  - Given his frequent fasting hypoglycemia, he will tolerate lower dose  of Tresiba to 65 units SQ nightly.  He is advised to continue Trulicity 1.5 mg SQ weekly, and continue Metformin 1000 mg po twice daily (will switch to Metformin 1000 mg XR daily with breakfast once he depletes his current supply). -He is encouraged to continue monitoring blood glucose using his CGM at least 4 times per day, before meals and at bedtime and call the clinic if he gets readings less than 70 or greater than 200 for 3 tests in a row.  - Patient specific target  A1c;  LDL, HDL, Triglycerides,  were discussed in detail.  2) Blood Pressure /Hypertension:   His blood pressure is controlled to target.  He is advised to continue Metoprolol 100 mg po daily and Benicar HCT 40-25 mg po daily  3) Lipids/Hyperlipidemia: His most recent lipid panel from 12/30/19 shows controlled LDL of 56 and slightly elevated triglycerides of 162.  He is advised to continue Crestor 40 mg po every 3 days and Repatha 140 mg SQ every 2 weeks.  Side effect and precautions discussed with him.   4)  Weight/Diet:  His Body mass index is 31.93 kg/m.--   he is a candidate for weight loss.  I discussed with him the fact that loss of 5 - 10% of his  current body weight will have the most impact on his diabetes management.  CDE Consult will be initiated . Exercise, and detailed carbohydrates information provided  -  detailed on discharge instructions.  5) Chronic Care/Health Maintenance: -he  is on ACEI/ARB and Statin medications and  is encouraged to initiate and continue to follow up with Ophthalmology, Dentist, Podiatrist at least yearly or according to recommendations, and advised to  stay away from smoking. I have recommended yearly flu vaccine and pneumonia vaccine at least every 5 years; moderate intensity exercise for up to 150 minutes weekly; and  sleep for at least 7 hours a day.  - he is  advised to maintain close follow up with [name], MD for primary care needs, as well as his other providers for optimal and  coordinated care.  - Time spent on this patient care encounter:  35 min, of which > 50% was spent in  counseling and the rest reviewing his blood glucose logs , discussing his hypoglycemia and hyperglycemia episodes, reviewing his current and  previous labs / studies  ( including abstraction from other facilities) and medications  doses and developing a  long term treatment plan and documenting his care.   Please refer to Patient Instructions for Blood Glucose Monitoring and Insulin/Medications Dosing Guide"  in media tab for additional information. Please  also refer to " Patient Self  Inventory" in the Media  tab for reviewed elements of pertinent patient history.  [name] participated in the discussions, expressed understanding, and voiced agreement with the above plans.  All questions were answered to his satisfaction. he is encouraged to contact clinic should he have any questions or concerns prior to his return visit.    Follow up plan: - Return in about 4 months (around 07/24/2020) for Diabetes follow up, Previsit labs, Virtual visit ok.  Rayetta Pigg, [facility] [address] [place], [place] [address] Phone: [phone] Fax: [phone]  03/24/2020, 3:13 PM  This note was partially dictated with voice recognition software. Similar sounding words can be transcribed inadequately or may not  be corrected upon review.

## 2020-03-24 NOTE — Patient Instructions (Signed)

## 2020-03-28 DIAGNOSIS — Z7189 Other specified counseling: Secondary | ICD-10-CM | POA: Insufficient documentation

## 2020-03-28 NOTE — Progress Notes (Signed)
Cardiology Office Note   Date:  03/29/2020   ID:  Marvin Frost, DOB 15-Sep-1945, MRN [id number]  PCP:  [name], MD  Cardiologist:   Minus Breeding, MD   Chief Complaint  Patient presents with  . Palpitations      History of Present Illness: Marvin Frost is a 74 y.o. male who presents for follow up of CAD.  he had stenting of his mid LAD in December of 2019.   Since I last saw him he did have some palpitations.  However, he was taking both carvedilol and metoprolol.  Carvedilol was stopped by Dr. Livia Snellen.  He is no longer having these palpitations.  He denies any cardiovascular symptoms such as chest pressure, neck or arm discomfort.  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  With this level of activity he denies any cardiovascular symptoms.     Past Medical History:  Diagnosis Date  . Allergy    seasonal  . Anemia    IDA  . Arthritis   . CAD (coronary artery disease)   . 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 Cataract    both removed  . Diabetes mellitus 1995  . Dysrhythmia    history of heart palpitations   . Gastric polyps 09/18/2017  . GERD (gastroesophageal reflux disease)   . History of frequent urinary tract infections   . History of hiatal hernia   . Hyperlipemia   . Hypertension   . Obesity   . Pancreatitis     Past Surgical History:  Procedure Laterality Date  . CARDIAC CATHETERIZATION     2004 and 1999- done by Dr Percival Spanish   . COLONOSCOPY  11/2003   diverticulosis, no polyps  . CORONARY STENT INTERVENTION N/A 05/28/2018   Procedure: CORONARY STENT INTERVENTION;  Surgeon: Sherren Mocha, MD;  Location: [facility];  Service: Cardiovascular;  Laterality: N/A;  . EYE SURGERY Bilateral    cateracts - dr Katy Fitch  . HYDROCELE EXCISION / REPAIR    . INTRAVASCULAR PRESSURE WIRE/FFR STUDY N/A 05/28/2018   Procedure: INTRAVASCULAR PRESSURE WIRE/FFR STUDY;  Surgeon: Sherren Mocha, MD;  Location: [facility];   Service: Cardiovascular;  Laterality: N/A;  . left calf surgery      related to trauma of being torn   . LEFT HEART CATH AND CORONARY ANGIOGRAPHY N/A 05/28/2018   Procedure: LEFT HEART CATH AND CORONARY ANGIOGRAPHY;  Surgeon: Sherren Mocha, MD;  Location: [facility];  Service: Cardiovascular;  Laterality: N/A;  . left index finger arthrioscopic surgery     . lymph node removed from right neck     . right shoulder surgery    . TOTAL HIP ARTHROPLASTY Right 03/16/2015   Procedure: RIGHT TOTAL HIP ARTHROPLASTY ANTERIOR APPROACH;  Surgeon: Paralee Cancel, MD;  Location: [facility];  Service: Orthopedics;  Laterality: Right;  . TOTAL HIP ARTHROPLASTY Left 06/22/2015   Procedure: LEFT TOTAL HIP ARTHROPLASTY ANTERIOR APPROACH;  Surgeon: Paralee Cancel, MD;  Location: [facility];  Service: Orthopedics;  Laterality: Left;  . UPPER GASTROINTESTINAL ENDOSCOPY     hiatal hernia 2cm, mild gastritis - 2012, multiple hyperplastic and fundic gland gastric polyps 2019     Current Outpatient Medications  Medication Sig Dispense Refill  . amoxicillin (AMOXIL) 500 MG capsule Take 4 capsules (2,000 mg total) by mouth See admin instructions. Take four capsules (2000 mg) by mouth one hour prior to dental appointments  4 capsule 10  . aspirin EC 81 MG EC tablet Take 1 tablet (81 mg total) by mouth daily.    . Blood Glucose Monitoring Suppl (FREESTYLE LITE) DEVI Use to check BS bid. DX. E11.9 1 each 0  . cetirizine (ZYRTEC) 10 MG tablet Take 10 mg by mouth as needed for allergies or rhinitis.     . Continuous Blood Gluc Sensor (FREESTYLE LIBRE 14 DAY SENSOR) MISC Inject 1 each into the skin every 14 (fourteen) days. Use as directed. 6 each 2  . Dulaglutide (TRULICITY) 1.5 DI/2.6EB SOPN Inject 1.5 mg into the skin once a week. 6 mL 3  . Evolocumab (REPATHA SURECLICK) 583 MG/ML SOAJ Inject 140 mg into the skin every 14 (fourteen) days. 6 mL 4  . ferrous sulfate 325 (65 FE) MG tablet Take 1 tablet (325 mg total) by mouth  every other day. (Patient taking differently: Take 325 mg by mouth every morning. ) 90 tablet 1  . Insulin Pen Needle (NOVOTWIST) 32G X 5 MM MISC USE WITH INSULIN PENS UP TO 5 TIMES DAILY Dx E11.59 500 each 4  . Lancets (FREESTYLE) lancets Check BS BID and PRN. DX.E11.9 300 each 3  . metFORMIN (GLUMETZA) 1000 MG (MOD) 24 hr tablet Take 1 tablet (1,000 mg total) by mouth daily with breakfast. 90 tablet 3  . metoprolol succinate (TOPROL-XL) 100 MG 24 hr tablet TAKE 1 TABLET DAILY FOR BLOOD PRESSURE CONTROL 90 tablet 1  . nitroGLYCERIN (NITROSTAT) 0.4 MG SL tablet DISSOLVE 1 TABLET UNDER THE TONGUE EVERY 5 MINUTES AS NEEDED FOR CHEST PAIN 25 tablet 11  . olmesartan-hydrochlorothiazide (BENICAR HCT) 40-25 MG tablet Take 1 tablet by mouth daily. 90 tablet 3  . pantoprazole (PROTONIX) 40 MG tablet Take 1 tablet (40 mg total) by mouth 2 (two) times daily after a meal. (Patient taking differently: Take 40 mg by mouth daily. ) 180 tablet 3  . rosuvastatin (CRESTOR) 40 MG tablet Take 1 tablet (40 mg total) by mouth every 3 (three) days. NEED OV. 90 tablet 0  . terbinafine (LAMISIL) 1 % cream Apply 1 application topically 2 (two) times daily. 30 g 0  . TRESIBA FLEXTOUCH 200 UNIT/ML FlexTouch Pen INJECT 70 UNITS UNDER THE SKIN DAILY (Patient taking differently: 65 Units. ) 27 mL 3   No current facility-administered medications for this visit.    Allergies:   Brilinta [ticagrelor], Liraglutide, Baxinets [cetylpyridinium-benzocaine], Celebrex [celecoxib], Toprol xl [metoprolol succinate], Triplex ad, Welchol [colesevelam hcl], Norvasc [amlodipine besylate], and Statins    ROS:  Please see the history of present illness.   Otherwise, review of systems are positive for none.   All other systems are reviewed and negative.    PHYSICAL EXAM: VS:  BP (!) 146/85   Pulse 68   Ht 5\' 8"  (1.727 m)   Wt 208 lb 3.2 oz (94.4 kg)   SpO2 97%   BMI 31.66 kg/m  , BMI Body mass index is 31.66 kg/m. GENERAL:  Well  appearing NECK:  No jugular venous distention, waveform within normal limits, carotid upstroke brisk and symmetric, no bruits, no thyromegaly LUNGS:  Clear to auscultation bilaterally CHEST:  Unremarkable HEART:  PMI not displaced or sustained,S1 and S2 within normal limits, no S3, no S4, no clicks, no rubs, no murmurs ABD:  Flat, positive bowel sounds normal in frequency in pitch, no bruits, no rebound, no guarding, no midline pulsatile mass, no hepatomegaly, no splenomegaly EXT:  2 plus pulses throughout, no edema, no cyanosis no clubbing  EKG:  EKG is ordered today. The ekg ordered today demonstrates sinus rhythm, rate 78, axis within normal limits, intervals within normal limits, no acute ST-T wave changes.   Recent Labs: 12/30/2019: ALT 11; BUN 12; Creatinine, Ser 1.01; Hemoglobin 14.4; Platelets 222; Potassium 4.3; Sodium 142; TSH 2.720    Lipid Panel    Component Value Date/Time   CHOL 121 12/30/2019 0804   CHOL 157 12/31/2012 0857   TRIG 162 (H) 12/30/2019 0804   TRIG 271 (H) 02/17/2016 0824   TRIG 184 (H) 12/31/2012 0857   HDL 37 (L) 12/30/2019 0804   HDL 44 02/17/2016 0824   HDL 45 12/31/2012 0857   CHOLHDL 3.3 12/30/2019 0804   LDLCALC 56 12/30/2019 0804   LDLCALC 52 01/19/2014 1007   LDLCALC 75 12/31/2012 0857   LDLDIRECT 39 11/02/2016 0806      Wt Readings from Last 3 Encounters:  03/29/20 208 lb 3.2 oz (94.4 kg)  03/24/20 210 lb (95.3 kg)  12/30/19 215 lb 2 oz (97.6 kg)      Other studies Reviewed: Additional studies/ records that were reviewed today include: None. Review of the above records demonstrates:  Please see elsewhere in the note.     ASSESSMENT AND PLAN:  CAD- The patient has no new sypmtoms.  No further cardiovascular testing is indicated.  We will continue with aggressive risk reduction and meds as listed.  HYPERLIPIDEMIA - 74.  No change in therapy.   HYPERTENSION - The blood pressure is at target.  No change in therapy.   DM  - A1C was down to 6.8 by his report.  I will defer to his endocrinologist.   COVID EDUCATION - He interestingly has had and somehow he has booster.  Current medicines are reviewed at length with the patient today.  The patient does not have concerns regarding medicines.  The following changes have been made:  no change  Labs/ tests ordered today include: None  Orders Placed This Encounter  Procedures  . EKG 12-Lead     Disposition:   FU with me in 12 months.     Signed, Minus Breeding, MD  03/29/2020 1:24 PM    [facility]

## 2020-03-29 ENCOUNTER — Ambulatory Visit (INDEPENDENT_AMBULATORY_CARE_PROVIDER_SITE_OTHER): Payer: Medicare Other | Admitting: Cardiology

## 2020-03-29 ENCOUNTER — Encounter: Payer: Self-pay | Admitting: Cardiology

## 2020-03-29 ENCOUNTER — Other Ambulatory Visit: Payer: Self-pay

## 2020-03-29 VITALS — BP 146/85 | HR 68 | Ht 68.0 in | Wt 208.2 lb

## 2020-03-29 DIAGNOSIS — Z9861 Coronary angioplasty status: Secondary | ICD-10-CM

## 2020-03-29 DIAGNOSIS — E785 Hyperlipidemia, unspecified: Secondary | ICD-10-CM | POA: Diagnosis not present

## 2020-03-29 DIAGNOSIS — I2510 Atherosclerotic heart disease of native coronary artery without angina pectoris: Secondary | ICD-10-CM

## 2020-03-29 DIAGNOSIS — I10 Essential (primary) hypertension: Secondary | ICD-10-CM | POA: Diagnosis not present

## 2020-03-29 DIAGNOSIS — E1159 Type 2 diabetes mellitus with other circulatory complications: Secondary | ICD-10-CM | POA: Diagnosis not present

## 2020-03-29 DIAGNOSIS — Z7189 Other specified counseling: Secondary | ICD-10-CM

## 2020-03-29 NOTE — Patient Instructions (Signed)
Medication Instructions:   No changes  *If you need a refill on your cardiac medications before your next appointment, please call your pharmacy*   Lab Work: Not needed    Testing/Procedures:  Not needed  Follow-Up: At [facility], you and your health needs are our priority.  As part of our continuing mission to provide you with exceptional heart care, we have created designated Provider Care Teams.  These Care Teams include your primary Cardiologist (physician) and Advanced Practice Providers (APPs -  Physician Assistants and Nurse Practitioners) who all work together to provide you with the care you need, when you need it.     Your next appointment:   12 month(s)  The format for your next appointment:   In Person  Provider:   Dre Hochrein, MD   

## 2020-03-30 LAB — HM DIABETES EYE EXAM

## 2020-04-19 ENCOUNTER — Ambulatory Visit (INDEPENDENT_AMBULATORY_CARE_PROVIDER_SITE_OTHER): Payer: Medicare Other

## 2020-04-19 ENCOUNTER — Other Ambulatory Visit: Payer: Self-pay

## 2020-04-19 DIAGNOSIS — Z23 Encounter for immunization: Secondary | ICD-10-CM | POA: Diagnosis not present

## 2020-06-04 ENCOUNTER — Encounter: Payer: Self-pay | Admitting: *Deleted

## 2020-06-28 DIAGNOSIS — M5412 Radiculopathy, cervical region: Secondary | ICD-10-CM | POA: Diagnosis not present

## 2020-07-01 ENCOUNTER — Ambulatory Visit: Payer: Medicare Other | Admitting: Family Medicine

## 2020-07-05 DIAGNOSIS — M542 Cervicalgia: Secondary | ICD-10-CM | POA: Diagnosis not present

## 2020-07-05 DIAGNOSIS — M5030 Other cervical disc degeneration, unspecified cervical region: Secondary | ICD-10-CM | POA: Diagnosis not present

## 2020-07-05 DIAGNOSIS — M5412 Radiculopathy, cervical region: Secondary | ICD-10-CM | POA: Diagnosis not present

## 2020-07-07 ENCOUNTER — Ambulatory Visit (INDEPENDENT_AMBULATORY_CARE_PROVIDER_SITE_OTHER): Payer: Medicare Other | Admitting: Family Medicine

## 2020-07-07 ENCOUNTER — Other Ambulatory Visit: Payer: Self-pay

## 2020-07-07 ENCOUNTER — Encounter: Payer: Self-pay | Admitting: Family Medicine

## 2020-07-07 VITALS — BP 159/85 | HR 79 | Temp 97.4°F | Resp 20 | Ht 68.0 in | Wt 214.4 lb

## 2020-07-07 DIAGNOSIS — I152 Hypertension secondary to endocrine disorders: Secondary | ICD-10-CM | POA: Diagnosis not present

## 2020-07-07 DIAGNOSIS — E559 Vitamin D deficiency, unspecified: Secondary | ICD-10-CM | POA: Diagnosis not present

## 2020-07-07 DIAGNOSIS — I10 Essential (primary) hypertension: Secondary | ICD-10-CM | POA: Diagnosis not present

## 2020-07-07 DIAGNOSIS — E1159 Type 2 diabetes mellitus with other circulatory complications: Secondary | ICD-10-CM | POA: Diagnosis not present

## 2020-07-07 DIAGNOSIS — E1169 Type 2 diabetes mellitus with other specified complication: Secondary | ICD-10-CM

## 2020-07-07 DIAGNOSIS — Z125 Encounter for screening for malignant neoplasm of prostate: Secondary | ICD-10-CM | POA: Diagnosis not present

## 2020-07-07 DIAGNOSIS — E782 Mixed hyperlipidemia: Secondary | ICD-10-CM | POA: Diagnosis not present

## 2020-07-07 DIAGNOSIS — E669 Obesity, unspecified: Secondary | ICD-10-CM

## 2020-07-07 LAB — BAYER DCA HB A1C WAIVED: HB A1C (BAYER DCA - WAIVED): 7.9 % — ABNORMAL HIGH (ref <7.0)

## 2020-07-07 MED ORDER — METOPROLOL SUCCINATE ER 100 MG PO TB24: ORAL_TABLET | ORAL | 1 refills | Status: DC

## 2020-07-07 MED ORDER — PANTOPRAZOLE SODIUM 40 MG PO TBEC: 40.0000 mg | DELAYED_RELEASE_TABLET | Freq: Every day | ORAL | 3 refills | Status: DC

## 2020-07-07 NOTE — Progress Notes (Signed)
Subjective:  Patient ID: Marvin Frost, male    DOB: 22-Nov-1945  Age: 75 y.o. MRN: [id number]  CC: Medical Management of Chronic Issues   HPI Marvin Frost presents for follow-up of hypertension. Patient has no history of headache chest pain or shortness of breath or recent cough. Patient also denies symptoms of TIA such as numbness weakness lateralizing. Patient checks  blood pressure at home and has not had any elevated readings recently. Patient denies side effects from his medication. States taking it regularly.  His blood pressure is elevated here every time he comes in but it is better when he is at home.  He tells me that he is seeing Dr. Rolena Infante for his neck pain.  He was started on gabapentin a few weeks ago.  He is taking 300 mg at bedtime.  He is getting some relief.  Follow-up of diabetes. Patient does not check blood sugar at home Patient denies symptoms such as polyuria, polydipsia, excessive hunger, nausea No significant hypoglycemic spells noted. Medications as noted below. Taking them regularly without complication/adverse reaction being reported today.  Patient says that he gets myalgias if he takes more than 2-3 doses a week of his Crestor.  He is no longer taking the Crestor.  He is taking Repatha instead.  His cholesterol got so low that he was told by Dr. Percival Spanish to reduce the dose to once every 3 weeks.  He is due to have his first cholesterol level today since making that switch.   Depression screen [facility] 2/9 07/07/2020 12/30/2019 07/24/2019  Decreased Interest 0 0 0  Down, Depressed, Hopeless 0 0 0  PHQ - 2 Score 0 0 0  Some recent data might be hidden    History Marvin Frost has a past medical history of Allergy, Anemia, Arthritis, CAD (coronary artery disease), Cancer ([place]), Cataract, Diabetes mellitus (1995), Dysrhythmia, Gastric polyps (09/18/2017), GERD (gastroesophageal reflux disease), History of frequent urinary tract infections, History of hiatal hernia, Hyperlipemia,  Hypertension, Obesity, and Pancreatitis.   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   His family history includes Diabetes in his brother; GI problems in his sister; Healthy in his daughter; Heart attack in his mother; Heart attack (age of onset: 36) in his father; Heart attack (age of onset: 39) in his brother; Heart disease in his brother and mother.He reports that he quit smoking about 36 years ago. His smoking use included cigarettes. He has a 10.00 pack-year smoking history. He quit smokeless tobacco use about 42 years ago.  His smokeless tobacco use included chew. He reports that he does not drink alcohol and does not use drugs.    ROS Review of Systems  Constitutional: Negative for fever.  Respiratory: Negative for shortness of breath.   Cardiovascular: Negative for chest pain.  Musculoskeletal: Negative for arthralgias.  Skin: Negative for rash.    Objective:  BP (!) 159/85   Pulse 79   Temp (!) 97.4 F (36.3 C) (Temporal)   Resp 20   Ht _0  (1.727 m)   Wt 214 lb 6 oz (97.2 kg)   SpO2 95%   BMI 32.60  kg/m   BP Readings from Last 3 Encounters:  07/07/20 (!) 159/85  03/29/20 (!) 146/85  03/24/20 122/75    Wt Readings from Last 3 Encounters:  07/07/20 214 lb 6 oz (97.2 kg)  03/29/20 208 lb 3.2 oz (94.4 kg)  03/24/20 210 lb (95.3 kg)     Physical Exam Vitals reviewed.  Constitutional:      Appearance: He is well-developed and well-nourished.  HENT:     Head:  Normocephalic and atraumatic.     Right Ear: Tympanic membrane and external ear normal. No decreased hearing noted.     Left Ear: Tympanic membrane and external ear normal. No decreased hearing noted.     Mouth/Throat:     Pharynx: No oropharyngeal exudate or posterior oropharyngeal erythema.  Eyes:     Pupils: Pupils are equal, round, and reactive to light.  Cardiovascular:     Rate and Rhythm: Normal rate and regular rhythm.     Heart sounds: No murmur heard.   Pulmonary:     Effort: No respiratory distress.     Breath sounds: Normal breath sounds.  Abdominal:     General: Bowel sounds are normal.     Palpations: Abdomen is soft. There is no mass.     Tenderness: There is no abdominal tenderness.  Musculoskeletal:     Cervical back: Normal range of motion and neck supple.       Assessment & Plan:   Marvin Frost was seen today for medical management of chronic issues.  Diagnoses and all orders for this visit:  Essential hypertension, benign -     Bayer DCA Hb A1c Waived -     CBC with Differential/Platelet -     CMP14+EGFR -     Lipid panel -     metoprolol succinate (TOPROL-XL) 100 MG 24 hr tablet; TAKE 1 TABLET DAILY FOR BLOOD PRESSURE CONTROL  DM type 2 causing vascular disease (HCC) -     Bayer DCA Hb A1c Waived -     CBC with Differential/Platelet -     CMP14+EGFR -     Lipid panel  Obesity, diabetes, and hypertension syndrome (HCC) -     T4, Free -     TSH  Combined hyperlipidemia associated with type 2 diabetes mellitus (HCC)  Screening for prostate cancer -     PR PSA, TOTAL SCREENING  Vitamin D deficiency -     VITAMIN D 25 Hydroxy (Vit-D Deficiency, Fractures)  Other orders -     pantoprazole (PROTONIX) 40 MG tablet; Take 1 tablet (40 mg total) by mouth daily.       I have changed Marvin Frost's pantoprazole. I am also having him maintain his FreeStyle Lite, cetirizine, freestyle, aspirin, ferrous sulfate, NovoTwist, amoxicillin, nitroGLYCERIN,  Repatha SureClick, FreeStyle Libre 14 Day Sensor, [place] FlexTouch, olmesartan-hydrochlorothiazide, rosuvastatin, terbinafine, Trulicity, metFORMIN, and metoprolol succinate.  Allergies as of 07/07/2020      Reactions   Brilinta [ticagrelor]    Rash and SOB    Liraglutide Other (See Comments)   Abdominal pain and enlarged pancreas on CT ? pancreatitis   Baxinets [cetylpyridinium-benzocaine] Other (See Comments)   Unknown reaction   Celebrex [celecoxib] Nausea Only   Toprol Xl [metoprolol Succinate] Other (See Comments)   Unknown reaction - pt states it did not work   Triplex Ad Other (See Comments)   Abdominal pain and enlarged pancreas on CT? pancreatitis   Welchol [colesevelam Hcl] Other (See Comments)   constipation   Norvasc [amlodipine Besylate]  Rash   Statins Other (See Comments)   muscle and joint pain if taken for long periods of time, takes Crestor every 2-3 days to avoid this reaction      Medication List       Accurate as of July 07, 2020  8:44 PM. If you have any questions, ask your nurse or doctor.        amoxicillin 500 MG capsule Commonly known as: AMOXIL Take 4 capsules (2,000 mg total) by mouth See admin instructions. Take four capsules (2000 mg) by mouth one hour prior to dental appointments   aspirin 81 MG EC tablet Take 1 tablet (81 mg total) by mouth daily.   cetirizine 10 MG tablet Commonly known as: ZYRTEC Take 10 mg by mouth as needed for allergies or rhinitis.   ferrous sulfate 325 (65 FE) MG tablet Take 1 tablet (325 mg total) by mouth every other day. What changed: when to take this   freestyle lancets Check BS BID and PRN. DX.E11.9   FreeStyle Libre 14 Day Sensor Misc Inject 1 each into the skin every 14 (fourteen) days. Use as directed.   FreeStyle Lite Devi Use to check BS bid. DX. E11.9   metFORMIN 1000 MG (MOD) 24 hr tablet Commonly known as: GLUMETZA Take 1 tablet (1,000 mg total) by mouth daily with breakfast.   metoprolol  succinate 100 MG 24 hr tablet Commonly known as: TOPROL-XL TAKE 1 TABLET DAILY FOR BLOOD PRESSURE CONTROL   nitroGLYCERIN 0.4 MG SL tablet Commonly known as: NITROSTAT DISSOLVE 1 TABLET UNDER THE TONGUE EVERY 5 MINUTES AS NEEDED FOR CHEST PAIN   NovoTwist 32G X 5 MM Misc Generic drug: Insulin Pen Needle USE WITH INSULIN PENS UP TO 5 TIMES DAILY Dx E11.59   olmesartan-hydrochlorothiazide 40-25 MG tablet Commonly known as: Benicar HCT Take 1 tablet by mouth daily.   pantoprazole 40 MG tablet Commonly known as: PROTONIX Take 1 tablet (40 mg total) by mouth daily.   Repatha SureClick 952 MG/ML Soaj Generic drug: Evolocumab Inject 140 mg into the skin every 14 (fourteen) days.   rosuvastatin 40 MG tablet Commonly known as: CRESTOR Take 1 tablet (40 mg total) by mouth every 3 (three) days. NEED OV.   terbinafine 1 % cream Commonly known as: LAMISIL Apply 1 application topically 2 (two) times daily.   [place] FlexTouch 200 UNIT/ML FlexTouch Pen Generic drug: insulin degludec INJECT 70 UNITS UNDER THE SKIN DAILY What changed: See the new instructions.   Trulicity 1.5 WU/1.3KG Sopn Generic drug: Dulaglutide Inject 1.5 mg into the skin once a week.        Follow-up: Return in about 3 months (around 10/05/2020).  [name], M.D.

## 2020-07-08 LAB — CBC WITH DIFFERENTIAL/PLATELET
Basophils Absolute: 0.1 10*3/uL (ref 0.0–0.2)
Basos: 2 %
EOS (ABSOLUTE): 0.3 10*3/uL (ref 0.0–0.4)
Eos: 4 %
Hematocrit: 45.6 % (ref 37.5–51.0)
Hemoglobin: 15.2 g/dL (ref 13.0–17.7)
Immature Grans (Abs): 0.1 10*3/uL (ref 0.0–0.1)
Immature Granulocytes: 1 %
Lymphocytes Absolute: 1.5 10*3/uL (ref 0.7–3.1)
Lymphs: 18 %
MCH: 29.6 pg (ref 26.6–33.0)
MCHC: 33.3 g/dL (ref 31.5–35.7)
MCV: 89 fL (ref 79–97)
Monocytes Absolute: 0.8 10*3/uL (ref 0.1–0.9)
Monocytes: 9 %
Neutrophils Absolute: 5.7 10*3/uL (ref 1.4–7.0)
Neutrophils: 66 %
Platelets: 280 10*3/uL (ref 150–450)
RBC: 5.14 x10E6/uL (ref 4.14–5.80)
RDW: 13 % (ref 11.6–15.4)
WBC: 8.6 10*3/uL (ref 3.4–10.8)

## 2020-07-08 LAB — CMP14+EGFR
ALT: 16 IU/L (ref 0–44)
AST: 12 IU/L (ref 0–40)
Albumin/Globulin Ratio: 1.7 (ref 1.2–2.2)
Albumin: 3.9 g/dL (ref 3.7–4.7)
Alkaline Phosphatase: 68 IU/L (ref 44–121)
BUN/Creatinine Ratio: 11 (ref 10–24)
BUN: 12 mg/dL (ref 8–27)
Bilirubin Total: 0.3 mg/dL (ref 0.0–1.2)
CO2: 25 mmol/L (ref 20–29)
Calcium: 9.3 mg/dL (ref 8.6–10.2)
Chloride: 96 mmol/L (ref 96–106)
Creatinine, Ser: 1.06 mg/dL (ref 0.76–1.27)
GFR calc Af Amer: 78 mL/min/{1.73_m2} (ref >59)
GFR calc non Af Amer: 68 mL/min/{1.73_m2} (ref >59)
Globulin, Total: 2.3 g/dL (ref 1.5–4.5)
Glucose: 237 mg/dL — ABNORMAL HIGH (ref 65–99)
Potassium: 4.8 mmol/L (ref 3.5–5.2)
Sodium: 135 mmol/L (ref 134–144)
Total Protein: 6.2 g/dL (ref 6.0–8.5)

## 2020-07-08 LAB — LIPID PANEL
Chol/HDL Ratio: 6.6 ratio — ABNORMAL HIGH (ref 0.0–5.0)
Cholesterol, Total: 258 mg/dL — ABNORMAL HIGH (ref 100–199)
HDL: 39 mg/dL — ABNORMAL LOW (ref >39)
LDL Chol Calc (NIH): 147 mg/dL — ABNORMAL HIGH (ref 0–99)
Triglycerides: 385 mg/dL — ABNORMAL HIGH (ref 0–149)
VLDL Cholesterol Cal: 72 mg/dL — ABNORMAL HIGH (ref 5–40)

## 2020-07-08 LAB — T4, FREE: Free T4: 1.42 ng/dL (ref 0.82–1.77)

## 2020-07-08 LAB — VITAMIN D 25 HYDROXY (VIT D DEFICIENCY, FRACTURES): Vit D, 25-Hydroxy: 44.5 ng/mL (ref 30.0–100.0)

## 2020-07-08 LAB — TSH: TSH: 3.35 u[IU]/mL (ref 0.450–4.500)

## 2020-07-13 ENCOUNTER — Other Ambulatory Visit: Payer: Self-pay | Admitting: Family Medicine

## 2020-07-13 DIAGNOSIS — E782 Mixed hyperlipidemia: Secondary | ICD-10-CM

## 2020-07-22 DIAGNOSIS — M5412 Radiculopathy, cervical region: Secondary | ICD-10-CM | POA: Diagnosis not present

## 2020-07-26 ENCOUNTER — Ambulatory Visit (INDEPENDENT_AMBULATORY_CARE_PROVIDER_SITE_OTHER): Payer: Medicare Other | Admitting: *Deleted

## 2020-07-26 DIAGNOSIS — Z0000 Encounter for general adult medical examination without abnormal findings: Secondary | ICD-10-CM | POA: Diagnosis not present

## 2020-07-26 NOTE — Patient Instructions (Signed)
  [facility] Maintenance Summary and Written Plan of Care  Mr. Lightcap ,  Thank you for allowing me to perform your Medicare Annual Wellness Visit and for your ongoing commitment to your health.   Health Maintenance & Immunization History Health Maintenance  Topic Date Due  . HEMOGLOBIN A1C  01/04/2021  . FOOT EXAM  03/24/2021  . OPHTHALMOLOGY EXAM  03/30/2021  . TETANUS/TDAP  04/26/2021  . INFLUENZA VACCINE  Completed  . COVID-19 Vaccine  Completed  . Hepatitis C Screening  Completed  . PNA vac Low Risk Adult  Completed   Immunization History  Administered Date(s) Administered  . Fluad Quad(high Dose 65+) 03/26/2019, 04/19/2020  . Influenza Whole 02/24/2010  . Influenza, High Dose Seasonal PF 04/15/2015, 04/12/2016, 04/05/2017, 04/16/2018  . Influenza,inj,Quad PF,6+ Mos 03/26/2013, 03/30/2014  . Moderna Sars-Covid-2 Vaccination 07/31/2019, 08/28/2019, 03/15/2020  . Pneumococcal Conjugate-13 06/17/2013  . Pneumococcal Polysaccharide-23 02/24/2009  . Td 02/24/2006  . Tdap 04/27/2011  . Zoster 07/27/2006  . Zoster Recombinat (Shingrix) 07/22/2018    These are the patient goals that we discussed: Goals Addressed            This Visit's Progress   . AWV       07/26/2020 AWV Goal: Diabetes Management  . Patient will maintain an A1C level below 8.0 . Patient will not develop any diabetic foot complications . Patient will not experience any hypoglycemic episodes over the next 3 months . Patient will notify our office of any CBG readings outside of the provider recommended range by calling [phone] . Patient will adhere to provider recommendations for diabetes management  Patient Self Management Activities . take all medications as prescribed and report any negative side effects . monitor and record blood sugar readings as directed . adhere to a low carbohydrate diet that incorporates lean proteins, vegetables, whole grains, low glycemic  fruits . check feet daily noting any sores, cracks, injuries, or callous formations . see PCP or podiatrist if he notices any changes in his legs, feet, or toenails . Patient will visit PCP and have an A1C level checked every 3 to 6 months as directed  . have a yearly eye exam to monitor for vascular changes associated with diabetes and will request that the report be sent to his pcp.  . consult with his PCP regarding any changes in his health or new or worsening symptoms         This is a list of Health Maintenance Items that are overdue or due now: There are no preventive care reminders to display for this patient.   Orders/Referrals Placed Today: No orders of the defined types were placed in this encounter.  (Contact our referral department at [phone] if you have not spoken with someone about your referral appointment within the next 5 days)    Follow-up Plan . Follow-up with [name], MD as planned . Keep follow up appointments with Endocrinology for daibetes

## 2020-07-26 NOTE — Patient Instructions (Signed)

## 2020-07-26 NOTE — Progress Notes (Signed)
MEDICARE ANNUAL WELLNESS VISIT  07/26/2020  Telephone Visit Disclaimer This Medicare AWV was conducted by telephone due to national recommendations for restrictions regarding the COVID-19 Pandemic (e.g. social distancing).  I verified, using two identifiers, that I am speaking with Marvin Frost or their authorized healthcare agent. I discussed the limitations, risks, security, and privacy concerns of performing an evaluation and management service by telephone and the potential availability of an in-person appointment in the future. The patient expressed understanding and agreed to proceed.  Location of Patient: Home Location of Provider (nurse):  [facility]  Subjective:    Marvin Frost is a 75 y.o. male patient of Stacks, Cletus Gash, MD who had a Medicare Annual Wellness Visit today via telephone. Marvin Frost is Retired and lives with their spouse. he has 1daughter. he reports that he is socially active and does interact with friends/family regularly. he is minimally physically active and enjoys traveling, he also has a black [employer], and restoring his camper.  Patient Care Team: [name], MD as PCP - General (Family Medicine) Minus Breeding, MD as PCP - Cardiology (Cardiology) Hayden Pedro, MD as Consulting Physician (Ophthalmology) Paralee Cancel, MD as Consulting Physician (Orthopedic Surgery) Minus Breeding, MD as Consulting Physician (Cardiology) Cassandria Anger, MD as Consulting Physician (Endocrinology)  Advanced Directives 07/26/2020 07/24/2019 07/22/2018 05/27/2018 07/26/2017 07/10/2017 11/12/2015  Does Patient Have a Medical Advance Directive? Yes Yes Yes No Yes Yes Yes  Type of Paramedic of [place];Living will Living will;Healthcare Power of [place];Living will - - - [place];Living will  Does patient want to make changes to medical advance directive? No - Patient declined  No - Patient declined No - Patient declined - - - -  Copy of [place] in Chart? No - copy requested No - copy requested No - copy requested - - - Yes  Would patient like information on creating a medical advance directive? - - - No - Patient declined - - -  Pre-existing out of facility DNR order (yellow form or pink MOST form) - - - - - - -    Hospital Utilization Over the Past 12 Months: # of hospitalizations or ER visits: 0 # of surgeries: 1 -Endoscopy   Review of Systems    Patient reports that his overall health is unchanged compared to last year.  History obtained from chart review and the patient  Patient Reported Readings (BP, Pulse, CBG, Weight, etc) CBG 185  Pain Assessment Pain : No/denies pain     Current Medications & Allergies (verified) Allergies as of 07/26/2020      Reactions   Brilinta [ticagrelor]    Rash and SOB    Liraglutide Other (See Comments)   Abdominal pain and enlarged pancreas on CT ? pancreatitis   Baxinets [cetylpyridinium-benzocaine] Other (See Comments)   Unknown reaction   Celebrex [celecoxib] Nausea Only   Toprol Xl [metoprolol Succinate] Other (See Comments)   Unknown reaction - pt states it did not work   Triplex Ad Other (See Comments)   Abdominal pain and enlarged pancreas on CT? pancreatitis   Welchol [colesevelam Hcl] Other (See Comments)   constipation   Norvasc [amlodipine Besylate] Rash   Statins Other (See Comments)   muscle and joint pain if taken for long periods of time, takes Crestor every 2-3 days to avoid this reaction      Medication List       Accurate  as of July 26, 2020 10:34 AM. If you have any questions, ask your nurse or doctor.        amoxicillin 500 MG capsule Commonly known as: AMOXIL Take 4 capsules (2,000 mg total) by mouth See admin instructions. Take four capsules (2000 mg) by mouth one hour prior to dental appointments   aspirin 81 MG EC tablet Take 1 tablet (81 mg total) by  mouth daily.   cetirizine 10 MG tablet Commonly known as: ZYRTEC Take 10 mg by mouth as needed for allergies or rhinitis.   ferrous sulfate 325 (65 FE) MG tablet Take 1 tablet (325 mg total) by mouth every other day. What changed: when to take this   freestyle lancets Check BS BID and PRN. DX.E11.9   FreeStyle Libre 14 Day Sensor Misc Inject 1 each into the skin every 14 (fourteen) days. Use as directed.   FreeStyle Lite Devi Use to check BS bid. DX. E11.9   gabapentin 300 MG capsule Commonly known as: NEURONTIN Take by mouth in the morning and at bedtime.   metFORMIN 1000 MG (MOD) 24 hr tablet Commonly known as: GLUMETZA Take 1 tablet (1,000 mg total) by mouth daily with breakfast.   metoprolol succinate 100 MG 24 hr tablet Commonly known as: TOPROL-XL TAKE 1 TABLET DAILY FOR BLOOD PRESSURE CONTROL   nitroGLYCERIN 0.4 MG SL tablet Commonly known as: NITROSTAT DISSOLVE 1 TABLET UNDER THE TONGUE EVERY 5 MINUTES AS NEEDED FOR CHEST PAIN   NovoTwist 32G X 5 MM Misc Generic drug: Insulin Pen Needle USE WITH INSULIN PENS UP TO 5 TIMES DAILY Dx E11.59   olmesartan-hydrochlorothiazide 40-25 MG tablet Commonly known as: Benicar HCT Take 1 tablet by mouth daily.   pantoprazole 40 MG tablet Commonly known as: PROTONIX Take 1 tablet (40 mg total) by mouth daily.   Repatha SureClick [id number] MG/ML Soaj Generic drug: Evolocumab Inject 140 mg into the skin every 14 (fourteen) days.   rosuvastatin 40 MG tablet Commonly known as: CRESTOR Take 1 tablet (40 mg total) by mouth every 3 (three) days. NEED OV.   terbinafine 1 % cream Commonly known as: LAMISIL Apply 1 application topically 2 (two) times daily. What changed: additional instructions   Tresiba FlexTouch 200 UNIT/ML FlexTouch Pen Generic drug: insulin degludec INJECT 70 UNITS UNDER THE SKIN DAILY What changed: See the new instructions.   Trulicity 1.5 0000000 Sopn Generic drug: Dulaglutide Inject 1.5 mg into the  skin once a week.       History (reviewed): Past Medical History:  Diagnosis Date  . Allergy    seasonal  . Anemia    IDA  . Arthritis   . CAD (coronary artery disease)   . 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 Cataract    both removed  . Diabetes mellitus 1995  . Dysrhythmia    history of heart palpitations   . Gastric polyps 09/18/2017  . GERD (gastroesophageal reflux disease)   . History of frequent urinary tract infections   . History of hiatal hernia   . Hyperlipemia   . Hypertension   . Obesity   . Pancreatitis    Past Surgical History:  Procedure Laterality Date  . CARDIAC CATHETERIZATION     2004 and 1999- done by Dr Percival Spanish   . COLONOSCOPY  11/2003   diverticulosis, no polyps  . CORONARY STENT INTERVENTION N/A 05/28/2018   Procedure: CORONARY STENT INTERVENTION;  Surgeon: Sherren Mocha, MD;  Location: [facility];  Service: Cardiovascular;  Laterality: N/A;  . EYE SURGERY Bilateral    cateracts - dr Katy Fitch  . HYDROCELE EXCISION / REPAIR    . INTRAVASCULAR PRESSURE WIRE/FFR STUDY N/A 05/28/2018   Procedure: INTRAVASCULAR PRESSURE WIRE/FFR STUDY;  Surgeon: Sherren Mocha, MD;  Location: [facility];  Service: Cardiovascular;  Laterality: N/A;  . left calf surgery      related to trauma of being torn   . LEFT HEART CATH AND CORONARY ANGIOGRAPHY N/A 05/28/2018   Procedure: LEFT HEART CATH AND CORONARY ANGIOGRAPHY;  Surgeon: Sherren Mocha, MD;  Location: [facility];  Service: Cardiovascular;  Laterality: N/A;  . left index finger arthrioscopic surgery     . lymph node removed from right neck     . right shoulder surgery    . TOTAL HIP ARTHROPLASTY Right 03/16/2015   Procedure: RIGHT TOTAL HIP ARTHROPLASTY ANTERIOR APPROACH;  Surgeon: Paralee Cancel, MD;  Location: [facility];  Service: Orthopedics;  Laterality: Right;  . TOTAL HIP ARTHROPLASTY Left 06/22/2015   Procedure: LEFT TOTAL HIP ARTHROPLASTY ANTERIOR  APPROACH;  Surgeon: Paralee Cancel, MD;  Location: [facility];  Service: Orthopedics;  Laterality: Left;  . UPPER GASTROINTESTINAL ENDOSCOPY     hiatal hernia 2cm, mild gastritis - 2012, multiple hyperplastic and fundic gland gastric polyps 2019   Family History  Problem Relation Age of Onset  . Heart disease Mother   . Heart attack Mother   . Diabetes Brother   . Heart disease Brother   . Heart attack Brother 25  . Heart attack Father 38  . GI problems Sister        torn esophagus during endoscopy  . Healthy Daughter   . Colon polyps Neg Hx   . Esophageal cancer Neg Hx   . Rectal cancer Neg Hx   . Stomach cancer Neg Hx   . Colon cancer Neg Hx    Social History   Socioeconomic History  . Marital status: Married    Spouse name: Marvin Frost  . Number of children: 1  . Years of education: Some College  . Highest education level: Associate degree: occupational, Hotel manager, or vocational program  Occupational History  . Occupation: Best boy: [employer]    Comment: retired  Tobacco Use  . Smoking status: Former Smoker    Packs/day: 1.00    Years: 10.00    Pack years: 10.00    Types: Cigarettes    Quit date: 04/06/1984    Years since quitting: 36.3  . Smokeless tobacco: Former Systems developer    Types: [place] date: 06/26/1978  Vaping Use  . Vaping Use: Never used  Substance and Sexual Activity  . Alcohol use: No  . Drug use: No  . Sexual activity: Yes  Other Topics Concern  . Not on file  Social History Narrative   Married   Retired from [employer]   1 child   Former but no current tobacco   No EtOH/drugs   Social Determinants of Radio broadcast assistant Strain: Not on file  Food Insecurity: Not on file  Transportation Needs: Not on file  Physical Activity: Not on file  Stress: Not on file  Social Connections: Not on file    Activities of Daily Living In your present state of health, do you have any difficulty performing the following activities:  07/26/2020  Hearing? N  Vision? N  Comment Wears reading glasses  Difficulty  concentrating or making decisions? N  Walking or climbing stairs? N  Dressing or bathing? N  Doing errands, shopping? N  Preparing Food and eating ? N  Using the Toilet? N  In the past six months, have you accidently leaked urine? N  Do you have problems with loss of bowel control? N  Managing your Medications? N  Managing your Finances? N  Housekeeping or managing your Housekeeping? 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   Depression Screen PHQ 2/9 Scores 07/26/2020 07/07/2020 12/30/2019 07/24/2019 07/24/2019 07/24/2019 07/01/2019  PHQ - 2 Score 0 0 0 0 0 0 0  PHQ- 9 Score - - - - - - -     Fall Risk Fall Risk  07/26/2020 07/07/2020 12/30/2019 07/24/2019 07/24/2019  Falls in the past year? 0 0 0 0 0  Number falls in past yr: - - - 0 0  Injury with Fall? - - - 0 0  Risk for fall due to : - - - History of fall(s) No Fall Risks  Follow up - Falls evaluation completed Falls evaluation completed Falls evaluation completed Falls evaluation completed     Objective:  Lynk Marti seemed alert and oriented and he participated appropriately during our telephone visit.  Blood  Pressure Weight BMI  BP Readings from Last 3 Encounters:  07/07/20 (!) 159/85  03/29/20 (!) 146/85  03/24/20 122/75   Wt Readings from Last 3 Encounters:  07/07/20 214 lb 6 oz (97.2 kg)  03/29/20 208 lb 3.2 oz (94.4 kg)  03/24/20 210 lb (95.3 kg)   BMI Readings from Last 1 Encounters:  07/07/20 32.60 kg/m    *Unable to obtain current vital signs, weight, and BMI due to telephone visit type  Hearing/Vision  . Said did not seem to have difficulty with hearing/understanding during the telephone conversation . Reports that he has had a formal eye exam by an eye care professional within the past year . Reports that he has not had a formal hearing evaluation within the past year *Unable to fully assess hearing and vision during telephone visit type  Cognitive Function: 6CIT Screen 07/26/2020 07/24/2019  What Year? 0 points 0 points  What month? 0 points 0 points  What time? 0 points 0 points  Count back from 20 0 points 0 points  Months in reverse 0 points 0 points  Repeat phrase 0 points 0 points  Total Score 0 0   (Normal:0-7, Significant for Dysfunction: >8)  Normal Cognitive Function Screening: Yes   Immunization & Health Maintenance Record Immunization History  Administered Date(s) Administered  . Fluad Quad(high Dose 65+) 03/26/2019, 04/19/2020  . Influenza Whole 02/24/2010  . Influenza, High Dose Seasonal PF 04/15/2015, 04/12/2016, 04/05/2017, 04/16/2018  . Influenza,inj,Quad PF,6+ Mos 03/26/2013, 03/30/2014  . Moderna Sars-Covid-2  Vaccination 07/31/2019, 08/28/2019, 03/15/2020  . Pneumococcal Conjugate-13 06/17/2013  . Pneumococcal Polysaccharide-23 02/24/2009  . Td 02/24/2006  . Tdap 04/27/2011  . Zoster 07/27/2006  . Zoster Recombinat (Shingrix) 07/22/2018    Health Maintenance  Topic Date Due  . HEMOGLOBIN A1C  01/04/2021  . FOOT EXAM  03/24/2021  . OPHTHALMOLOGY EXAM  03/30/2021  . TETANUS/TDAP  04/26/2021  . INFLUENZA VACCINE  Completed  . COVID-19  Vaccine  Completed  . Hepatitis C Screening  Completed  . PNA vac Low Risk Adult  Completed       Assessment  This is a routine wellness examination for Marvin Frost.  Health Maintenance: Due or Overdue There are no preventive care reminders to display for this patient.  Marvin Frost does not need a referral for Community Assistance: Care Management:   no Social Work:    no Prescription Assistance:  no Nutrition/Diabetes Education:  no   Plan:  Personalized Goals Goals Addressed            This Visit's Progress   . AWV       07/26/2020 AWV Goal: Diabetes Management  . Patient will maintain an A1C level below 8.0 . Patient will not develop any diabetic foot complications . Patient will not experience any hypoglycemic episodes over the next 3 months . Patient will notify our office of any CBG readings outside of the provider recommended range by calling [phone] . Patient will adhere to provider recommendations for diabetes management  Patient Self Management Activities . take all medications as prescribed and report any negative side effects . monitor and record blood sugar readings as directed . adhere to a low carbohydrate diet that incorporates lean proteins, vegetables, whole grains, low glycemic fruits . check feet daily noting any sores, cracks, injuries, or callous formations . see PCP or podiatrist if he notices any changes in his legs, feet, or toenails . Patient will visit PCP and have an A1C level checked every 3 to 6 months as directed  . have a yearly eye exam to monitor for vascular changes associated with diabetes and will request that the report be sent to his pcp.  . consult with his PCP regarding any changes in his health or new or worsening symptoms       Personalized Health Maintenance & Screening Recommendations  Patient is up to date on Health Maintenance at this time  Lung Cancer Screening Recommended: no (Low Dose CT Chest recommended if Age  52-80 years, 30 pack-year currently smoking OR have quit w/in past 15 years) Hepatitis C Screening recommended: no HIV Screening recommended: no  Advanced Directives: Written information was not prepared per patient's request.  Referrals & Orders No orders of the defined types were placed in this encounter.   Follow-up Plan . Follow-up with [name], MD as planned . Keep follow up appointments with Endocrinology for daibetes  AVS printed and mailed to patient    I have personally reviewed and noted the following in the patient's chart:   . Medical and social history . Use of alcohol, tobacco or illicit drugs  . Current medications and supplements . Functional ability and status . Nutritional status . Physical activity . Advanced directives . List of other physicians . Hospitalizations, surgeries, and ER visits in previous 12 months . Vitals . Screenings to include cognitive, depression, and falls . Referrals and appointments  In addition, I have reviewed and discussed with Marvin Frost certain preventive protocols, quality metrics, and best practice recommendations.  A written personalized care plan for preventive services as well as general preventive health recommendations is available and can be mailed to the patient at his request.      Lynnea Ferrier, LPN  [phone]

## 2020-07-27 ENCOUNTER — Ambulatory Visit (INDEPENDENT_AMBULATORY_CARE_PROVIDER_SITE_OTHER): Payer: Medicare Other | Admitting: Nurse Practitioner

## 2020-07-27 ENCOUNTER — Encounter: Payer: Self-pay | Admitting: Nurse Practitioner

## 2020-07-27 ENCOUNTER — Other Ambulatory Visit: Payer: Self-pay

## 2020-07-27 VITALS — BP 132/82 | HR 76 | Wt 217.4 lb

## 2020-07-27 DIAGNOSIS — I10 Essential (primary) hypertension: Secondary | ICD-10-CM

## 2020-07-27 DIAGNOSIS — E782 Mixed hyperlipidemia: Secondary | ICD-10-CM

## 2020-07-27 DIAGNOSIS — E1159 Type 2 diabetes mellitus with other circulatory complications: Secondary | ICD-10-CM

## 2020-07-27 MED ORDER — TRESIBA FLEXTOUCH 200 UNIT/ML ~~LOC~~ SOPN: 66.0000 [IU] | PEN_INJECTOR | Freq: Every day | SUBCUTANEOUS | 3 refills | Status: DC

## 2020-07-27 NOTE — Progress Notes (Signed)
07/27/2020, 3:04 PM     Endocrinology follow-up note   Subjective:    Patient ID: Marvin Frost, male    DOB: 1945/11/28.  Marvin Frost is being seen in follow-up after he was seen in consultation for management of currently uncontrolled symptomatic diabetes. -He also has dyslipidemia, hypertension. PMD:   Marvin Fraise, MD.   Past Medical History:  Diagnosis Date  . Allergy    seasonal  . Anemia    IDA  . Arthritis   . CAD (coronary artery disease)   . 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 Cataract    both removed  . Diabetes mellitus 1995  . Dysrhythmia    history of heart palpitations   . Gastric polyps 09/18/2017  . GERD (gastroesophageal reflux disease)   . History of frequent urinary tract infections   . History of hiatal hernia   . Hyperlipemia   . Hypertension   . Obesity   . Pancreatitis     Past Surgical History:  Procedure Laterality Date  . CARDIAC CATHETERIZATION     2004 and 1999- done by Dr Percival Spanish   . COLONOSCOPY  11/2003   diverticulosis, no polyps  . CORONARY STENT INTERVENTION N/A 05/28/2018   Procedure: CORONARY STENT INTERVENTION;  Surgeon: Sherren Mocha, MD;  Location: [facility];  Service: Cardiovascular;  Laterality: N/A;  . EYE SURGERY Bilateral    cateracts - dr Katy Fitch  . HYDROCELE EXCISION / REPAIR    . INTRAVASCULAR PRESSURE WIRE/FFR STUDY N/A 05/28/2018   Procedure: INTRAVASCULAR PRESSURE WIRE/FFR STUDY;  Surgeon: Sherren Mocha, MD;  Location: [facility];  Service: Cardiovascular;  Laterality: N/A;  . left calf surgery      related to trauma of being torn   . LEFT HEART CATH AND CORONARY ANGIOGRAPHY N/A 05/28/2018   Procedure: LEFT HEART CATH AND CORONARY ANGIOGRAPHY;  Surgeon: Sherren Mocha, MD;  Location: [facility];  Service: Cardiovascular;  Laterality: N/A;  . left index finger arthrioscopic  surgery     . lymph node removed from right neck     . right shoulder surgery    . TOTAL HIP ARTHROPLASTY Right 03/16/2015   Procedure: RIGHT TOTAL HIP ARTHROPLASTY ANTERIOR APPROACH;  Surgeon: Paralee Cancel, MD;  Location: [facility];  Service: Orthopedics;  Laterality: Right;  . TOTAL HIP ARTHROPLASTY Left 06/22/2015   Procedure: LEFT TOTAL HIP ARTHROPLASTY ANTERIOR APPROACH;  Surgeon: Paralee Cancel, MD;  Location: [facility];  Service: Orthopedics;  Laterality: Left;  . UPPER GASTROINTESTINAL ENDOSCOPY     hiatal hernia 2cm, mild gastritis - 2012, multiple hyperplastic and fundic gland gastric polyps 2019    Social History   Socioeconomic History  . Marital status: Married    Spouse name: Marvin Frost  . Number of children: 1  . Years of education: Some College  . Highest education level: Associate degree: occupational, Hotel manager, or vocational program  Occupational History  . Occupation: Best boy: [employer]    Comment: retired  Tobacco Use  . Smoking status: Former Smoker  Packs/day: 1.00    Years: 10.00    Pack years: 10.00    Types: Cigarettes    Quit date: 04/06/1984    Years since quitting: 36.3  . Smokeless tobacco: Former Systems developer    Types: [place] date: 06/26/1978  Vaping Use  . Vaping Use: Never used  Substance and Sexual Activity  . Alcohol use: No  . Drug use: No  . Sexual activity: Yes  Other Topics Concern  . Not on file  Social History Narrative   Married   Retired from [employer]   1 child   Former but no current tobacco   No EtOH/drugs   Social Determinants of Radio broadcast assistant Strain: Not on file  Food Insecurity: Not on file  Transportation Needs: Not on file  Physical Activity: Not on file  Stress: Not on file  Social Connections: Not on file    Family History  Problem Relation Age of Onset  . Heart disease Mother   . Heart attack Mother   . Diabetes Brother   . Heart disease Brother   . Heart attack Brother 67  .  Heart attack Father 88  . GI problems Sister        torn esophagus during endoscopy  . Healthy Daughter   . Colon polyps Neg Hx   . Esophageal cancer Neg Hx   . Rectal cancer Neg Hx   . Stomach cancer Neg Hx   . Colon cancer Neg Hx     Outpatient Encounter Medications as of 07/27/2020  Medication Sig  . amoxicillin (AMOXIL) 500 MG capsule Take 4 capsules (2,000 mg total) by mouth See admin instructions. Take four capsules (2000 mg) by mouth one hour prior to dental appointments  . aspirin EC 81 MG EC tablet Take 1 tablet (81 mg total) by mouth daily.  . Blood Glucose Monitoring Suppl (FREESTYLE LITE) DEVI Use to check BS bid. DX. E11.9  . cetirizine (ZYRTEC) 10 MG tablet Take 10 mg by mouth as needed for allergies or rhinitis.   . Continuous Blood Gluc Sensor (FREESTYLE LIBRE 14 DAY SENSOR) MISC Inject 1 each into the skin every 14 (fourteen) days. Use as directed.  . Dulaglutide (TRULICITY) 1.5 FG/1.8EX SOPN Inject 1.5 mg into the skin once a week.  . Evolocumab (REPATHA SURECLICK) 937 MG/ML SOAJ Inject 140 mg into the skin every 14 (fourteen) days.  . ferrous sulfate 325 (65 FE) MG tablet Take 1 tablet (325 mg total) by mouth every other day. (Patient taking differently: Take 325 mg by mouth every morning.)  . gabapentin (NEURONTIN) 300 MG capsule Take by mouth in the morning and at bedtime.  . Insulin Pen Needle (NOVOTWIST) 32G X 5 MM MISC USE WITH INSULIN PENS UP TO 5 TIMES DAILY Dx E11.59  . Lancets (FREESTYLE) lancets Check BS BID and PRN. DX.E11.9  . metFORMIN (GLUMETZA) 1000 MG (MOD) 24 hr tablet Take 1 tablet (1,000 mg total) by mouth daily with breakfast.  . metoprolol succinate (TOPROL-XL) 100 MG 24 hr tablet TAKE 1 TABLET DAILY FOR BLOOD PRESSURE CONTROL  . nitroGLYCERIN (NITROSTAT) 0.4 MG SL tablet DISSOLVE 1 TABLET UNDER THE TONGUE EVERY 5 MINUTES AS NEEDED FOR CHEST PAIN  . olmesartan-hydrochlorothiazide (BENICAR HCT) 40-25 MG tablet Take 1 tablet by mouth daily.  .  pantoprazole (PROTONIX) 40 MG tablet Take 1 tablet (40 mg total) by mouth daily.  . rosuvastatin (CRESTOR) 40 MG tablet Take 1 tablet (40 mg total) by mouth  every 3 (three) days. NEED OV.  . terbinafine (LAMISIL) 1 % cream Apply 1 application topically 2 (two) times daily. (Patient taking differently: Apply 1 application topically 2 (two) times daily. As needed)  . [DISCONTINUED] TRESIBA FLEXTOUCH 200 UNIT/ML FlexTouch Pen INJECT 70 UNITS UNDER THE SKIN DAILY (Patient taking differently: 65 Units.)  . insulin degludec (TRESIBA FLEXTOUCH) 200 UNIT/ML FlexTouch Pen Inject 66 Units into the skin at bedtime.   No facility-administered encounter medications on file as of 07/27/2020.    ALLERGIES: Allergies  Allergen Reactions  . Brilinta [Ticagrelor]     Rash and SOB   . Liraglutide Other (See Comments)    Abdominal pain and enlarged pancreas on CT ? pancreatitis  . Baxinets [Cetylpyridinium-Benzocaine] Other (See Comments)    Unknown reaction  . Celebrex [Celecoxib] Nausea Only  . Toprol Xl [Metoprolol Succinate] Other (See Comments)    Unknown reaction - pt states it did not work  . Triplex Ad Other (See Comments)    Abdominal pain and enlarged pancreas on CT? pancreatitis  . Welchol [Colesevelam Hcl] Other (See Comments)    constipation  . Norvasc [Amlodipine Besylate] Rash  . Statins Other (See Comments)    muscle and joint pain if taken for long periods of time, takes Crestor every 2-3 days to avoid this reaction    VACCINATION STATUS: Immunization History  Administered Date(s) Administered  . Fluad Quad(high Dose 65+) 03/26/2019, 04/19/2020  . Influenza Whole 02/24/2010  . Influenza, High Dose Seasonal PF 04/15/2015, 04/12/2016, 04/05/2017, 04/16/2018  . Influenza,inj,Quad PF,6+ Mos 03/26/2013, 03/30/2014  . Moderna Sars-Covid-2 Vaccination 07/31/2019, 08/28/2019, 03/15/2020  . Pneumococcal Conjugate-13 06/17/2013  . Pneumococcal Polysaccharide-23 02/24/2009  . Td 02/24/2006   . Tdap 04/27/2011  . Zoster 07/27/2006  . Zoster Recombinat (Shingrix) 07/22/2018    Diabetes He presents for his follow-up diabetic visit. He has type 2 diabetes mellitus. Onset time: He was diagnosed at approximate age of 11 years. His disease course has been worsening. There are no hypoglycemic associated symptoms. Pertinent negatives for hypoglycemia include no confusion, headaches, pallor or seizures. Pertinent negatives for diabetes include no chest pain, no fatigue, no polydipsia, no polyphagia, no polyuria and no weakness. There are no hypoglycemic complications. Symptoms are stable. Diabetic complications include heart disease. Risk factors for coronary artery disease include diabetes mellitus, dyslipidemia, family history, male sex, hypertension, sedentary lifestyle, tobacco exposure and obesity. Current diabetic treatment includes oral agent (monotherapy) and intensive insulin program. He is compliant with treatment most of the time. His weight is increasing steadily. He is following a generally unhealthy diet. When asked about meal planning, he reported none. He has not had a previous visit with a dietitian. He participates in exercise intermittently. His home blood glucose trend is decreasing steadily. His breakfast blood glucose range is generally 140-180 mg/dl. His lunch blood glucose range is generally 130-140 mg/dl. His dinner blood glucose range is generally >200 mg/dl. His bedtime blood glucose range is generally 180-200 mg/dl. (He presents today with his CGM and logs showing stable glycemic profile overall.  His previsit A1c on 07/07/20 was 7.9%, increasing from last visit of 6.8% but still near goal for him.  Analysis of his meter shows TIR 56%, TAR 44%, TBR 0%.  There are no documented or reported episodes of hypoglycemia.  He says he has not been as active lately due to the cold weather and C-spine issues, but has recently started to get back on track.  He mentions the possibility of  needing surgery in  March for his cervical spine.) An ACE inhibitor/angiotensin II receptor blocker is being taken. He does not see a podiatrist.Eye exam is current.  Hyperlipidemia This is a chronic problem. The current episode started more than 1 year ago. The problem is uncontrolled. Recent lipid tests were reviewed and are high. Exacerbating diseases include chronic renal disease, diabetes and obesity. Factors aggravating his hyperlipidemia include beta blockers. Pertinent negatives include no chest pain, myalgias or shortness of breath. Current antihyperlipidemic treatment includes statins (Repatha every 2 weeks. Only takes his statin once every few days dt myalgias). The current treatment provides moderate improvement of lipids. Compliance problems include medication side effects.  Risk factors for coronary artery disease include male sex, obesity, family history, dyslipidemia, diabetes mellitus, hypertension and a sedentary lifestyle.  Hypertension This is a chronic problem. The current episode started more than 1 year ago. The problem has been gradually improving since onset. The problem is controlled. Pertinent negatives include no chest pain, headaches, neck pain, palpitations or shortness of breath. There are no associated agents to hypertension. Risk factors for coronary artery disease include dyslipidemia, diabetes mellitus, family history, male gender, obesity, sedentary lifestyle and smoking/tobacco exposure. Past treatments include angiotensin blockers, diuretics and beta blockers. The current treatment provides moderate improvement. There are no compliance problems.  Hypertensive end-organ damage includes CAD/MI. Identifiable causes of hypertension include chronic renal disease.    Review of systems  Constitutional: + steadily increasing body weight,  current Body mass index is 33.06 kg/m. , no fatigue, no subjective hyperthermia, no subjective hypothermia Eyes: no blurry vision, no  xerophthalmia ENT: no sore throat, no nodules palpated in throat, no dysphagia/odynophagia, no hoarseness Cardiovascular: no chest pain, no shortness of breath, no palpitations, no leg swelling Respiratory: no cough, no shortness of breath Gastrointestinal: no nausea/vomiting/diarrhea Musculoskeletal: no muscle/joint aches, complains of neck pain (seeing neurosurgeon- may be having surgery in March) Skin: no rashes, no hyperemia Neurological: no tremors, no numbness, no tingling, no dizziness Psychiatric: no depression, no anxiety   Objective:    BP 132/82 (BP Location: Left Arm, Patient Position: Sitting)   Pulse 76   Wt 217 lb 6.4 oz (98.6 kg)   BMI 33.06 kg/m   Wt Readings from Last 3 Encounters:  07/27/20 217 lb 6.4 oz (98.6 kg)  07/07/20 214 lb 6 oz (97.2 kg)  03/29/20 208 lb 3.2 oz (94.4 kg)    BP Readings from Last 3 Encounters:  07/27/20 132/82  07/07/20 (!) 159/85  03/29/20 (!) 146/85     Physical Exam- Limited  Constitutional:  Body mass index is 33.06 kg/m. , not in acute distress, normal state of mind Eyes:  EOMI, no exophthalmos Neck: Supple Thyroid: No gross goiter Cardiovascular: RRR, no murmers, rubs, or gallops, no edema Respiratory: Adequate breathing efforts, no crackles, rales, rhonchi, or wheezing Musculoskeletal: no gross deformities, strength intact in all four extremities, no gross restriction of joint movements Skin:  no rashes, no hyperemia, varicose veins noted to BLE Neurological: no tremor with outstretched hands   CMP     Component Value Date/Time   NA 135 07/07/2020 1028   K 4.8 07/07/2020 1028   CL 96 07/07/2020 1028   CO2 25 07/07/2020 1028   GLUCOSE 237 (H) 07/07/2020 1028   GLUCOSE 154 (H) 05/29/2018 0217   BUN 12 07/07/2020 1028   CREATININE 1.06 07/07/2020 1028   CREATININE 0.95 12/31/2012 0857   CALCIUM 9.3 07/07/2020 1028   PROT 6.2 07/07/2020 1028   ALBUMIN 3.9 07/07/2020 1028  AST 12 07/07/2020 1028   ALT 16  07/07/2020 1028   ALKPHOS 68 07/07/2020 1028   BILITOT 0.3 07/07/2020 1028   GFRNONAA 68 07/07/2020 1028   GFRNONAA 81 12/31/2012 0857   GFRAA 78 07/07/2020 1028   GFRAA >89 12/31/2012 0857    Diabetic Labs (most recent): Lab Results  Component Value Date   HGBA1C 7.9 (H) 07/07/2020   HGBA1C 6.8 (A) 03/24/2020   HGBA1C 7.3 (A) 11/19/2019     Lipid Panel ( most recent) Lipid Panel     Component Value Date/Time   CHOL 258 (H) 07/07/2020 1028   CHOL 157 12/31/2012 0857   TRIG 385 (H) 07/07/2020 1028   TRIG 271 (H) 02/17/2016 0824   TRIG 184 (H) 12/31/2012 0857   HDL 39 (L) 07/07/2020 1028   HDL 44 02/17/2016 0824   HDL 45 12/31/2012 0857   CHOLHDL 6.6 (H) 07/07/2020 1028   LDLCALC 147 (H) 07/07/2020 1028   LDLCALC 52 01/19/2014 1007   LDLCALC 75 12/31/2012 0857   LDLDIRECT 39 11/02/2016 0806      Lab Results  Component Value Date   TSH 3.350 07/07/2020   TSH 2.720 12/30/2019   TSH 2.290 09/03/2014   FREET4 1.42 07/07/2020     Assessment & Plan:   1) DM type 2 causing vascular disease ([place])  - Muadh Mynatt has currently uncontrolled symptomatic type 2 DM since  75 years of age.  He presents today with his CGM and logs showing stable glycemic profile overall.  His previsit A1c on 07/07/20 was 7.9%, increasing from last visit of 6.8% but still near goal for him.  Analysis of his meter shows TIR 56%, TAR 44%, TBR 0%.  There are no documented or reported episodes of hypoglycemia.  He says he has not been as active lately due to the cold weather and C-spine issues, but has recently started to get back on track.  He mentions the possibility of needing surgery in March for his cervical spine.  -I have reviewed his recent labs with him.  - I had a long discussion with him about the progressive nature of diabetes and the pathology behind its complications. -his diabetes is complicated by coronary artery disease, obesity and he remains at a high risk for more acute and chronic  complications which include CAD, CVA, CKD, retinopathy, and neuropathy. These are all discussed in detail with him.  - Nutritional counseling repeated at each appointment due to patients tendency to fall back in to old habits.  - The patient admits there is a room for improvement in their diet and drink choices. -  Suggestion is made for the patient to avoid simple carbohydrates from their diet including Cakes, Sweet Desserts / Pastries, Ice Cream, Soda (diet and regular), Sweet Tea, Candies, Chips, Cookies, Sweet Pastries,  Store Bought Juices, Alcohol in Excess of  1-2 drinks a day, Artificial Sweeteners, Coffee Creamer, and "Sugar-free" Products. This will help patient to have stable blood glucose profile and potentially avoid unintended weight gain.   - I encouraged the patient to switch to  unprocessed or minimally processed complex starch and increased protein intake (animal or plant source), fruits, and vegetables.   - Patient is advised to stick to a routine mealtimes to eat 3 meals  a day and avoid unnecessary snacks ( to snack only to correct hypoglycemia).  - he is scheduled with Jearld Fenton, RDN, CDE for individualized diabetes education.  - I have approached him with the following individualized  plan to manage diabetes and patient agrees:   - He has done well without use of bolus insulin and is advised to stay off of it at this time.  - Given his stable glycemic profile, he is advised to continue Tresiba 66 units SQ daily at bedtime, continue Trulicity 1.5 mg SQ weekly, and Metformin 1000 mg XR daily with breakfast.   -He is encouraged to continue monitoring blood glucose using his CGM at least 4 times per day, before meals and at bedtime and call the clinic if he gets readings less than 70 or greater than 200 for 3 tests in a row.  - Patient specific target  A1c;  LDL, HDL, Triglycerides,  were discussed in detail.  2) Blood Pressure /Hypertension:   His blood pressure is  controlled to target.  He is advised to continue Metoprolol 100 mg po daily and Benicar HCT 40-25 mg po daily  3) Lipids/Hyperlipidemia: His most recent lipid panel from 07/07/20 shows uncontrolled LDL of 147 and elevated triglycerides of 385 (both worsening significantly since last check).  His PCP told him to stop his Crestor in between visits which may be contributing to his recent rebound in cholesterol.  He is advised to resume taking his Crestor 40 mg po every 3 days or so and continue his Repatha 140 mg SQ every 2 weeks.  Side effects and precautions discussed with him.    4)  Weight/Diet:  His Body mass index is 33.06 kg/m.--   he is a candidate for weight loss.  I discussed with him the fact that loss of 5 - 10% of his  current body weight will have the most impact on his diabetes management.  CDE Consult will be initiated . Exercise, and detailed carbohydrates information provided  -  detailed on discharge instructions.  5) Chronic Care/Health Maintenance: -he is on ACEI/ARB and Statin medications and is encouraged to initiate and continue to follow up with Ophthalmology, Dentist, Podiatrist at least yearly or according to recommendations, and advised to  stay away from smoking. I have recommended yearly flu vaccine and pneumonia vaccine at least every 5 years; moderate intensity exercise for up to 150 minutes weekly; and  sleep for at least 7 hours a day.  - he is  advised to maintain close follow up with Marvin Fraise, MD for primary care needs, as well as his other providers for optimal and coordinated care.  - Time spent on this patient care encounter:  35 min, of which > 50% was spent in  counseling and the rest reviewing his blood glucose logs , discussing his hypoglycemia and hyperglycemia episodes, reviewing his current and  previous labs / studies  ( including abstraction from other facilities) and medications  doses and developing a  long term treatment plan and documenting his  care.   Please refer to Patient Instructions for Blood Glucose Monitoring and Insulin/Medications Dosing Guide"  in media tab for additional information. Please  also refer to " Patient Self Inventory" in the Media  tab for reviewed elements of pertinent patient history.  [name] participated in the discussions, expressed understanding, and voiced agreement with the above plans.  All questions were answered to his satisfaction. he is encouraged to contact clinic should he have any questions or concerns prior to his return visit.    Follow up plan: - Return in about 4 months (around 11/24/2020) for Diabetes follow up- A1c and urine micro in office, Bring glucometer and logs, No previsit labs.  Rayetta Pigg, [facility] [address] [place], [place] [address] Phone: [phone] Fax: [phone]  07/27/2020, 3:04 PM

## 2020-09-06 DIAGNOSIS — M542 Cervicalgia: Secondary | ICD-10-CM | POA: Diagnosis not present

## 2020-09-14 DIAGNOSIS — M5412 Radiculopathy, cervical region: Secondary | ICD-10-CM | POA: Insufficient documentation

## 2020-09-14 HISTORY — DX: Radiculopathy, cervical region: M54.12

## 2020-09-22 DIAGNOSIS — M5412 Radiculopathy, cervical region: Secondary | ICD-10-CM | POA: Diagnosis not present

## 2020-09-23 ENCOUNTER — Other Ambulatory Visit: Payer: Self-pay | Admitting: "Endocrinology

## 2020-09-23 DIAGNOSIS — E1159 Type 2 diabetes mellitus with other circulatory complications: Secondary | ICD-10-CM

## 2020-09-24 DIAGNOSIS — M5412 Radiculopathy, cervical region: Secondary | ICD-10-CM | POA: Diagnosis not present

## 2020-10-06 ENCOUNTER — Other Ambulatory Visit: Payer: Self-pay

## 2020-10-06 ENCOUNTER — Ambulatory Visit (INDEPENDENT_AMBULATORY_CARE_PROVIDER_SITE_OTHER): Payer: Medicare Other | Admitting: Family Medicine

## 2020-10-06 ENCOUNTER — Encounter: Payer: Self-pay | Admitting: Family Medicine

## 2020-10-06 DIAGNOSIS — E1159 Type 2 diabetes mellitus with other circulatory complications: Secondary | ICD-10-CM | POA: Diagnosis not present

## 2020-10-06 MED ORDER — TRESIBA FLEXTOUCH 200 UNIT/ML ~~LOC~~ SOPN: 72.0000 [IU] | PEN_INJECTOR | Freq: Every day | SUBCUTANEOUS | 5 refills | Status: DC

## 2020-10-06 MED ORDER — TRULICITY 3 MG/0.5ML ~~LOC~~ SOAJ: 3.0000 mg | SUBCUTANEOUS | 1 refills | Status: DC

## 2020-10-06 NOTE — Progress Notes (Signed)
Subjective:  Patient ID: Marvin Frost, male    DOB: July 14, 1945  Age: 75 y.o. MRN: [id number]  CC: Medical Management of Chronic Issues   HPI Marvin Frost presents for climbing glucose as a result of going off of the novolog. Nothing to check my mealtime glucose .  However he states he is using the Trulicity daily.  He is concerned that he needs the NovoLog.  However when taking it he did have several lows.  They were mild lows and these were treated.  However he states that his endocrinology provider took him completely off of his NovoLog when his A1c at 6.8.  The next time he was in it was 7.9.  He was told that this was good for him.  However he is not comfortable with that number.  He is asking to go back on NovoLog.  Marvin Frost of his blood sugar up to 4 times a day since last September.  Evaluation shows that the glucose did start to climb after he was taken off of the NovoLog.  It has gone up significantly more in the last 6 weeks.  Several of his Frost are over 200 now.  There are definitely no lows.  Early in the log, dating back to September there were a few fasting Frost in the high 60s.  Patient denies significant symptoms other than just feeling nauseous.  Depression screen [facility] 2/9 10/06/2020 07/26/2020 07/07/2020  Decreased Interest 0 0 0  Down, Depressed, Hopeless 0 0 0  PHQ - 2 Score 0 0 0  Some recent data might be hidden    History [name] has a past medical history of Allergy, Anemia, Arthritis, CAD (coronary artery disease), Cancer ([place]), Cataract, Diabetes mellitus (1995), Dysrhythmia, Gastric polyps (09/18/2017), GERD (gastroesophageal reflux disease), History of frequent urinary tract infections, History of hiatal hernia, Hyperlipemia, Hypertension, Obesity, and Pancreatitis.   He has a past surgical history that includes right shoulder surgery; Hydrocele excision / repair; Colonoscopy (11/2003); Upper gastrointestinal endoscopy; Cardiac  catheterization; left calf surgery ; left index finger arthrioscopic surgery ; lymph node removed from right neck ; Total hip arthroplasty (Right, 03/16/2015); Total hip arthroplasty (Left, 06/22/2015); Eye surgery (Bilateral); LEFT HEART CATH AND CORONARY ANGIOGRAPHY (N/A, 05/28/2018); CORONARY STENT INTERVENTION (N/A, 05/28/2018); and INTRAVASCULAR PRESSURE WIRE/FFR STUDY (N/A, 05/28/2018).   His family history includes Diabetes in his brother; GI problems in his sister; Healthy in his daughter; Heart attack in his mother; Heart attack (age of onset: 17) in his father; Heart attack (age of onset: 59) in his brother; Heart disease in his brother and mother.He reports that he quit smoking about 36 years ago. His smoking use included cigarettes. He has a 10.00 pack-year smoking history. He quit smokeless tobacco use about 42 years ago.  His smokeless tobacco use included chew. He reports that he does not drink alcohol and does not use drugs.    ROS Review of Systems Negative except as per HPI Objective:  BP 118/65   Pulse 75   Temp (!) 97.5 F (36.4 C)   Ht 5\' 8"  (1.727 m)   Wt 212 lb 9.6 oz (96.4 kg)   SpO2 97%   BMI 32.33 kg/m   BP Frost from Last 3 Encounters:  10/06/20 118/65  07/27/20 132/82  07/07/20 (!) 159/85    Wt Frost from Last 3 Encounters:  10/06/20 212 lb 9.6 oz (96.4 kg)  07/27/20 217 lb 6.4 oz (98.6 kg)  07/07/20 214  lb 6 oz (97.2 kg)     Physical Exam Constitutional:      General: He is not in acute distress.    Appearance: Normal appearance.  HENT:     Head: Normocephalic and atraumatic.     Nose: Nose normal.  Musculoskeletal:        General: Normal range of motion.  Skin:    General: Skin is warm and dry.  Neurological:     General: No focal deficit present.     Mental Status: He is alert and oriented to person, place, and time.     Cranial Nerves: No cranial nerve deficit.     Motor: No weakness.     Coordination: Coordination normal.   Psychiatric:        Mood and Affect: Mood normal.        Behavior: Behavior normal.        Thought Content: Thought content normal.        Judgment: Judgment normal.       Assessment & Plan:   Marvin Frost was seen today for medical management of chronic issues.  Diagnoses and all orders for this visit:  DM type 2 causing vascular disease ([place]) -     insulin degludec (TRESIBA FLEXTOUCH) 200 UNIT/ML FlexTouch Pen; Inject 72 Units into the skin at bedtime.  Other orders -     Dulaglutide (TRULICITY) 3 HW/2.9HB SOPN; Inject 3 mg as directed once a week.    We discussed at length the pathophysiology of type 2 diabetes and the method of action of the GLP-1 agonist.  We also discussed the method of action of the insulin NovoLog.  I explained to him that they have similar purposes.  And that if his sugar was too high after meals increasing the Trulicity weekly I recommendation.  After the discussion he concurred.  Of note is that several of his fasting Frost have gone up to the 150+ range.  This relates back more to his Marvin Frost.  As a result I asked him to do a 10% increase in Marvin Frost to 72 units/day.   I have discontinued Marvin Frost. I have also changed his [employer]. Additionally, I am having him start on Trulicity. Lastly, I am having him maintain his FreeStyle Lite, cetirizine, freestyle, aspirin, ferrous sulfate, NovoTwist, amoxicillin, nitroGLYCERIN, Repatha SureClick, FreeStyle Libre 14 Day Sensor, olmesartan-hydrochlorothiazide, rosuvastatin, terbinafine, metFORMIN, pantoprazole, metoprolol succinate, and gabapentin.  Allergies as of 10/06/2020      Reactions   Brilinta [ticagrelor]    Rash and SOB    Liraglutide Other (See Comments)   Abdominal pain and enlarged pancreas on CT ? pancreatitis   Baxinets [cetylpyridinium-benzocaine] Other (See Comments)   Unknown reaction   Celebrex [celecoxib] Nausea Only   Toprol Xl [metoprolol Succinate] Other (See Comments)    Unknown reaction - pt states it did not work   Triplex Ad Other (See Comments)   Abdominal pain and enlarged pancreas on CT? pancreatitis   Welchol [colesevelam Hcl] Other (See Comments)   constipation   Norvasc [amlodipine Besylate] Rash   Statins Other (See Comments)   muscle and joint pain if taken for long periods of time, takes Crestor every 2-3 days to avoid this reaction      Medication List       Accurate as of October 06, 2020  5:21 PM. If you have any questions, ask your nurse or doctor.        STOP taking these medications   Trulicity 1.5  MG/0.5ML Sopn Generic drug: Dulaglutide Replaced by: Trulicity 3 ZO/1.0RU Sopn Stopped by: [name], MD     TAKE these medications   amoxicillin 500 MG capsule Commonly known as: AMOXIL Take 4 capsules (2,000 mg total) by mouth See admin instructions. Take four capsules (2000 mg) by mouth one hour prior to dental appointments   aspirin 81 MG EC tablet Take 1 tablet (81 mg total) by mouth daily.   cetirizine 10 MG tablet Commonly known as: ZYRTEC Take 10 mg by mouth as needed for allergies or rhinitis.   ferrous sulfate 325 (65 FE) MG tablet Take 1 tablet (325 mg total) by mouth every other day. What changed: when to take this   freestyle lancets Check BS BID and PRN. DX.E11.9   FreeStyle Libre 14 Day Sensor Misc Inject 1 each into the skin every 14 (fourteen) days. Use as directed.   FreeStyle Lite Devi Use to check BS bid. DX. E11.9   gabapentin 300 MG capsule Commonly known as: NEURONTIN Take by mouth in the morning and at bedtime.   metFORMIN 1000 MG (MOD) 24 hr tablet Commonly known as: GLUMETZA Take 1 tablet (1,000 mg total) by mouth daily with breakfast.   metoprolol succinate 100 MG 24 hr tablet Commonly known as: TOPROL-XL TAKE 1 TABLET DAILY FOR BLOOD PRESSURE CONTROL   nitroGLYCERIN 0.4 MG SL tablet Commonly known as: NITROSTAT DISSOLVE 1 TABLET UNDER THE TONGUE EVERY 5 MINUTES AS NEEDED FOR  CHEST PAIN   NovoTwist 32G X 5 MM Misc Generic drug: Insulin Pen Needle USE WITH INSULIN PENS UP TO 5 TIMES DAILY Dx E11.59   olmesartan-hydrochlorothiazide 40-25 MG tablet Commonly known as: Benicar HCT Take 1 tablet by mouth daily.   pantoprazole 40 MG tablet Commonly known as: PROTONIX Take 1 tablet (40 mg total) by mouth daily.   Repatha SureClick 045 MG/ML Soaj Generic drug: Evolocumab Inject 140 mg into the skin every 14 (fourteen) days.   rosuvastatin 40 MG tablet Commonly known as: CRESTOR Take 1 tablet (40 mg total) by mouth every 3 (three) days. NEED OV.   terbinafine 1 % cream Commonly known as: LAMISIL Apply 1 application topically 2 (two) times daily. What changed: additional instructions   Tresiba FlexTouch 200 UNIT/ML FlexTouch Pen Generic drug: insulin degludec Inject 72 Units into the skin at bedtime. What changed: how much to take Changed by: [name], MD   Trulicity 3 WU/9.8JX Sopn Generic drug: Dulaglutide Inject 3 mg as directed once a week. Replaces: Trulicity 1.5 BJ/4.7WG Sopn Started by: [name], MD      45 minutes was spent with the patient more than half of this was in counseling regarding his diabetes care as described above.  Follow-up: Return in about 1 month (around 11/05/2020) for diabetes.  [name], M.D.

## 2020-10-07 DIAGNOSIS — M5412 Radiculopathy, cervical region: Secondary | ICD-10-CM | POA: Diagnosis not present

## 2020-11-08 ENCOUNTER — Other Ambulatory Visit: Payer: Self-pay

## 2020-11-08 ENCOUNTER — Encounter: Payer: Self-pay | Admitting: Family Medicine

## 2020-11-08 ENCOUNTER — Ambulatory Visit (INDEPENDENT_AMBULATORY_CARE_PROVIDER_SITE_OTHER): Payer: Medicare Other | Admitting: Family Medicine

## 2020-11-08 VITALS — BP 119/66 | HR 72 | Temp 97.5°F | Ht 68.0 in | Wt 209.8 lb

## 2020-11-08 DIAGNOSIS — E782 Mixed hyperlipidemia: Secondary | ICD-10-CM

## 2020-11-08 DIAGNOSIS — E1159 Type 2 diabetes mellitus with other circulatory complications: Secondary | ICD-10-CM | POA: Diagnosis not present

## 2020-11-08 LAB — BAYER DCA HB A1C WAIVED: HB A1C (BAYER DCA - WAIVED): 8.6 % — ABNORMAL HIGH (ref <7.0)

## 2020-11-08 NOTE — Progress Notes (Signed)
Subjective:  Patient ID: Marvin Frost, male    DOB: 04-14-1946  Age: 75 y.o. MRN: [id number]  CC: Follow-up (diabetes)   HPI Marvin Frost presents forFollow-up of diabetes. Patient checks blood sugar at home.  Has Libre. ONly one low due to missing a meal. Avg. Fasting is 129.  Patient denies symptoms such as polyuria, polydipsia, excessive hunger, nausea No significant hypoglycemic spells noted. Medications reviewed. Pt reports taking them regularly without complication/adverse reaction being reported today.    History [place] has a past medical history of Allergy, Anemia, Arthritis, CAD (coronary artery disease), Cancer ([place]), Cataract, Diabetes mellitus (1995), Dysrhythmia, Gastric polyps (09/18/2017), GERD (gastroesophageal reflux disease), History of frequent urinary tract infections, History of hiatal hernia, Hyperlipemia, Hypertension, Obesity, and Pancreatitis.   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   His family history includes Diabetes in his brother; GI problems in his sister; Healthy in his daughter; Heart attack in his mother; Heart attack (age of onset: 58) in his father; Heart attack (age of onset: 62) in his brother; Heart disease in his brother and mother.He reports that he quit smoking about 36 years ago. His smoking use included cigarettes. He has a 10.00 pack-year smoking history. He quit smokeless tobacco use about 42 years ago.  His smokeless tobacco use included chew. He  reports that he does not drink alcohol and does not use drugs.  Current Outpatient Medications on File Prior to Visit  Medication Sig Dispense Refill  . amoxicillin (AMOXIL) 500 MG capsule Take 4 capsules (2,000 mg total) by mouth See admin instructions. Take four capsules (2000 mg) by mouth one hour prior to dental appointments 4 capsule 10  . aspirin EC 81 MG EC tablet Take 1 tablet (81 mg total) by mouth daily.    . Blood Glucose Monitoring Suppl (FREESTYLE LITE) DEVI Use to check BS bid. DX. E11.9 1 each 0  . cetirizine (ZYRTEC) 10 MG tablet Take 10 mg by mouth as needed for allergies or rhinitis.     . Continuous Blood Gluc Sensor (FREESTYLE LIBRE 14 DAY SENSOR) MISC Inject 1 each into the skin every 14 (fourteen) days. Use as directed. 6 each 2  . Dulaglutide (TRULICITY) 3 YB/6.3SL SOPN Inject 3 mg as directed once a week. 7 mL 1  . Evolocumab (REPATHA SURECLICK) 373 MG/ML SOAJ Inject 140 mg into the skin every 14 (fourteen) days. 6 mL 4  . ferrous sulfate 325 (65 FE) MG tablet Take 1 tablet (325 mg total) by mouth every other day. (Patient taking differently: Take 325 mg by mouth every morning.) 90 tablet 1  . gabapentin (NEURONTIN) 300 MG capsule Take by mouth in the morning and at bedtime.    . insulin degludec (TRESIBA FLEXTOUCH) 200 UNIT/ML FlexTouch Pen Inject 72 Units into the skin at bedtime. 30 mL 5  . Insulin Pen Needle (NOVOTWIST) 32G X 5 MM MISC USE WITH INSULIN PENS UP TO 5 TIMES DAILY Dx E11.59 500 each 4  . Lancets (FREESTYLE) lancets Check BS BID and PRN. DX.E11.9 300 each 3  . metFORMIN (GLUMETZA) 1000 MG (MOD) 24 hr tablet Take 1  tablet (1,000 mg total) by mouth daily with breakfast. 90 tablet 3  . metoprolol succinate (TOPROL-XL) 100 MG 24 hr tablet TAKE 1 TABLET DAILY FOR BLOOD PRESSURE CONTROL 90 tablet 1  . nitroGLYCERIN (NITROSTAT) 0.4 MG SL tablet DISSOLVE 1 TABLET UNDER THE TONGUE EVERY 5 MINUTES AS NEEDED FOR CHEST PAIN 25 tablet 11  . olmesartan-hydrochlorothiazide  (BENICAR HCT) 40-25 MG tablet Take 1 tablet by mouth daily. 90 tablet 3  . pantoprazole (PROTONIX) 40 MG tablet Take 1 tablet (40 mg total) by mouth daily. 90 tablet 3  . rosuvastatin (CRESTOR) 40 MG tablet Take 1 tablet (40 mg total) by mouth every 3 (three) days. NEED OV. 90 tablet 0  . terbinafine (LAMISIL) 1 % cream Apply 1 application topically 2 (two) times daily. (Patient taking differently: Apply 1 application topically 2 (two) times daily. As needed) 30 g 0   No current facility-administered medications on file prior to visit.    ROS Review of Systems  Constitutional: Negative for fever.  Respiratory: Negative for shortness of breath.   Cardiovascular: Negative for chest pain.  Musculoskeletal: Negative for arthralgias.  Skin: Negative for rash.    Objective:  BP 119/66   Pulse 72   Temp (!) 97.5 F (36.4 C)   Ht _0  (1.727 m)   Wt 209 lb 12.8 oz (95.2 kg)   SpO2 98%   BMI 31.90 kg/m   BP Readings from Last 3 Encounters:  11/08/20 119/66  10/06/20 118/65  07/27/20 132/82    Wt Readings from Last 3 Encounters:  11/08/20 209 lb 12.8 oz (95.2 kg)  10/06/20 212 lb 9.6 oz (96.4 kg)  07/27/20 217 lb 6.4 oz (98.6 kg)     Physical Exam Vitals reviewed.  Constitutional:      Appearance: He is well-developed. He is obese.  HENT:     Head: Normocephalic and atraumatic.     Right Ear: External ear normal.     Left Ear: External ear normal.     Mouth/Throat:     Pharynx: No oropharyngeal exudate or posterior oropharyngeal erythema.  Eyes:     Pupils: Pupils are equal, round, and reactive to light.  Cardiovascular:     Rate and Rhythm: Normal rate and regular rhythm.     Heart sounds: No murmur heard.   Pulmonary:     Effort: No respiratory distress.     Breath sounds: Normal breath sounds.  Musculoskeletal:     Cervical back: Normal range of motion and neck supple.  Neurological:     Mental Status: He is alert and oriented to person, place, and time.        Assessment & Plan:   Marvin Frost was seen today for follow-up.  Diagnoses and all orders for this visit:  DM type 2 causing vascular disease ([place]) -     Bayer DCA Hb A1c Waived -     CBC with Differential/Platelet -     CMP14+EGFR  Mixed hyperlipidemia -     Lipid panel      I am having [name] maintain his FreeStyle Lite, cetirizine, freestyle, aspirin, ferrous sulfate, NovoTwist, amoxicillin, nitroGLYCERIN, Repatha SureClick, FreeStyle Libre 14 Day Sensor, olmesartan-hydrochlorothiazide, rosuvastatin, terbinafine, metFORMIN, pantoprazole, metoprolol succinate, gabapentin, Tresiba FlexTouch, and Trulicity.  No orders of the defined types were placed in this encounter.    Follow-up: Return in about 3 months (around 02/08/2021).  [name], M.D.

## 2020-11-09 LAB — LIPID PANEL
Chol/HDL Ratio: 3 ratio (ref 0.0–5.0)
Cholesterol, Total: 140 mg/dL (ref 100–199)
HDL: 46 mg/dL (ref >39)
LDL Chol Calc (NIH): 71 mg/dL (ref 0–99)
Triglycerides: 128 mg/dL (ref 0–149)
VLDL Cholesterol Cal: 23 mg/dL (ref 5–40)

## 2020-11-09 LAB — CMP14+EGFR
ALT: 16 IU/L (ref 0–44)
AST: 15 IU/L (ref 0–40)
Albumin/Globulin Ratio: 1.9 (ref 1.2–2.2)
Albumin: 4.4 g/dL (ref 3.7–4.7)
Alkaline Phosphatase: 63 IU/L (ref 44–121)
BUN/Creatinine Ratio: 13 (ref 10–24)
BUN: 13 mg/dL (ref 8–27)
Bilirubin Total: 0.4 mg/dL (ref 0.0–1.2)
CO2: 24 mmol/L (ref 20–29)
Calcium: 9.1 mg/dL (ref 8.6–10.2)
Chloride: 98 mmol/L (ref 96–106)
Creatinine, Ser: 0.98 mg/dL (ref 0.76–1.27)
Globulin, Total: 2.3 g/dL (ref 1.5–4.5)
Glucose: 91 mg/dL (ref 65–99)
Potassium: 4.7 mmol/L (ref 3.5–5.2)
Sodium: 138 mmol/L (ref 134–144)
Total Protein: 6.7 g/dL (ref 6.0–8.5)
eGFR: 80 mL/min/{1.73_m2} (ref >59)

## 2020-11-09 LAB — CBC WITH DIFFERENTIAL/PLATELET
Basophils Absolute: 0.1 10*3/uL (ref 0.0–0.2)
Basos: 2 %
EOS (ABSOLUTE): 0.5 10*3/uL — ABNORMAL HIGH (ref 0.0–0.4)
Eos: 5 %
Hematocrit: 50.3 % (ref 37.5–51.0)
Hemoglobin: 16.4 g/dL (ref 13.0–17.7)
Immature Grans (Abs): 0 10*3/uL (ref 0.0–0.1)
Immature Granulocytes: 0 %
Lymphocytes Absolute: 1.7 10*3/uL (ref 0.7–3.1)
Lymphs: 19 %
MCH: 28.4 pg (ref 26.6–33.0)
MCHC: 32.6 g/dL (ref 31.5–35.7)
MCV: 87 fL (ref 79–97)
Monocytes Absolute: 0.7 10*3/uL (ref 0.1–0.9)
Monocytes: 8 %
Neutrophils Absolute: 5.8 10*3/uL (ref 1.4–7.0)
Neutrophils: 66 %
Platelets: 256 10*3/uL (ref 150–450)
RBC: 5.78 x10E6/uL (ref 4.14–5.80)
RDW: 14.3 % (ref 11.6–15.4)
WBC: 8.9 10*3/uL (ref 3.4–10.8)

## 2020-11-12 ENCOUNTER — Ambulatory Visit: Payer: Medicare Other | Admitting: Pharmacist

## 2020-11-23 ENCOUNTER — Telehealth: Payer: Self-pay | Admitting: *Deleted

## 2020-11-23 DIAGNOSIS — Z8679 Personal history of other diseases of the circulatory system: Secondary | ICD-10-CM

## 2020-11-23 DIAGNOSIS — E1169 Type 2 diabetes mellitus with other specified complication: Secondary | ICD-10-CM

## 2020-11-23 DIAGNOSIS — E782 Mixed hyperlipidemia: Secondary | ICD-10-CM

## 2020-11-23 MED ORDER — REPATHA SURECLICK 140 MG/ML ~~LOC~~ SOAJ: 140.0000 mg | SUBCUTANEOUS | 4 refills | Status: DC

## 2020-11-23 NOTE — Telephone Encounter (Signed)
Repatha PA started again for pt Form [facility] Electronic PA Form [phone] NCPDP) Message from [place] find matching patient   Will try tricare and alt ID for Medicare Both state - cannot find    Re-Faxed  TRICARE form  Again -jhb

## 2020-11-24 ENCOUNTER — Ambulatory Visit: Payer: Medicare Other | Admitting: Nurse Practitioner

## 2020-11-24 NOTE — Telephone Encounter (Signed)
Pt called to let Marvin Frost know that he spoke with someone from [employer] and was told that they did receive the Rx that was resent to them yesterday and have sent [place] a request for PA approval.

## 2020-11-24 NOTE — Telephone Encounter (Signed)
Patient called to give Roselyn Reef the phone# to [facility] for PA [phone]

## 2020-11-26 ENCOUNTER — Ambulatory Visit (INDEPENDENT_AMBULATORY_CARE_PROVIDER_SITE_OTHER): Payer: Medicare Other | Admitting: Pharmacist

## 2020-11-26 ENCOUNTER — Other Ambulatory Visit: Payer: Self-pay

## 2020-11-26 DIAGNOSIS — G720 Drug-induced myopathy: Secondary | ICD-10-CM

## 2020-11-26 DIAGNOSIS — E782 Mixed hyperlipidemia: Secondary | ICD-10-CM

## 2020-11-26 DIAGNOSIS — Z8679 Personal history of other diseases of the circulatory system: Secondary | ICD-10-CM

## 2020-11-26 DIAGNOSIS — E1169 Type 2 diabetes mellitus with other specified complication: Secondary | ICD-10-CM | POA: Diagnosis not present

## 2020-11-26 MED ORDER — REPATHA SURECLICK 140 MG/ML ~~LOC~~ SOAJ: 140.0000 mg | SUBCUTANEOUS | 4 refills | Status: DC

## 2020-11-26 MED ORDER — NITROGLYCERIN 0.4 MG SL SUBL: SUBLINGUAL_TABLET | SUBLINGUAL | 11 refills | Status: DC

## 2020-11-26 MED ORDER — ROSUVASTATIN CALCIUM 40 MG PO TABS: 40.0000 mg | ORAL_TABLET | ORAL | 1 refills | Status: DC

## 2020-11-26 MED ORDER — AMOXICILLIN 500 MG PO CAPS: 2000.0000 mg | ORAL_CAPSULE | ORAL | 10 refills | Status: DC

## 2020-11-26 NOTE — Progress Notes (Signed)
11/26/2020 Name: Marvin Frost MRN: [id number] DOB: 1946/05/31   HPI:  Marvin Frost is a 75 y.o. male patient referred to lipid clinic by PCP. 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    Cataract    both removed   Diabetes mellitus 1995   Dysrhythmia    history of heart palpitations    Gastric polyps 09/18/2017   GERD (gastroesophageal reflux disease)    History of frequent urinary tract infections    History of hiatal hernia    Hyperlipemia    Hypertension    Obesity    Pancreatitis     Intolerances: statins, but able to take 3 times weekly  Risk Factors:  LDL goal: <70, T2DM   Diet: recommend Mediterranean     Exercise: recommended    Family History: n/a   Social History: non-smoker; limit alcohol   Labs: Lipid Panel     Component Value Date/Time   CHOL 140 11/08/2020 1304   CHOL 157 12/31/2012 0857   TRIG 128 11/08/2020 1304   TRIG 271 (H) 02/17/2016 0824   TRIG 184 (H) 12/31/2012 0857   HDL 46 11/08/2020 1304   HDL 44 02/17/2016 0824   HDL 45 12/31/2012 0857   CHOLHDL 3.0 11/08/2020 1304   LDLCALC 71 11/08/2020 1304   LDLCALC 52 01/19/2014 1007   LDLCALC 75 12/31/2012 0857   LDLDIRECT 39 11/02/2016 0806   LABVLDL 23 11/08/2020 1304     Current Outpatient Medications on File Prior to Visit  Medication Sig Dispense Refill   amoxicillin (AMOXIL) 500 MG capsule Take 4 capsules (2,000 mg total) by mouth See admin instructions. Take four capsules (2000 mg) by mouth one hour prior to dental appointments 4 capsule 10   aspirin EC 81 MG EC tablet Take 1 tablet (81 mg total) by mouth daily.     Blood Glucose Monitoring Suppl (FREESTYLE LITE) DEVI Use to check BS bid. DX. E11.9 1 each 0   cetirizine (ZYRTEC) 10 MG tablet Take 10 mg by mouth as needed for allergies or rhinitis.       Continuous Blood Gluc Sensor (FREESTYLE LIBRE 14 DAY SENSOR) MISC Inject 1 each into the skin every 14 (fourteen) days. Use as directed. 6 each 2   Dulaglutide (TRULICITY) 3 PO/2.4MP SOPN Inject 3 mg as directed once a week. 7 mL 1   Evolocumab (REPATHA SURECLICK) 536 MG/ML SOAJ Inject 140 mg into the skin every 14 (fourteen) days. 6 mL 4   ferrous sulfate 325 (65 FE) MG tablet Take 1 tablet (325 mg total) by mouth every other day. (Patient taking differently: Take 325 mg by mouth every morning.) 90 tablet 1   gabapentin (NEURONTIN) 300 MG capsule Take by mouth in the morning and at bedtime.     insulin degludec (TRESIBA FLEXTOUCH) 200 UNIT/ML FlexTouch Pen Inject 72 Units into the skin at bedtime. 30 mL 5   Insulin Pen Needle (NOVOTWIST) 32G X 5 MM MISC USE WITH INSULIN PENS UP TO 5 TIMES DAILY Dx E11.59 500 each 4   Lancets (FREESTYLE) lancets Check BS BID and PRN. DX.E11.9 300 each 3   metFORMIN (GLUMETZA) 1000 MG (MOD) 24 hr tablet Take 1 tablet (1,000 mg total) by mouth daily  with breakfast. 90 tablet 3   metoprolol succinate (TOPROL-XL) 100 MG 24 hr tablet TAKE 1 TABLET DAILY FOR BLOOD PRESSURE CONTROL 90 tablet 1   nitroGLYCERIN (NITROSTAT) 0.4 MG SL tablet DISSOLVE 1 TABLET UNDER THE TONGUE EVERY 5 MINUTES AS NEEDED FOR CHEST PAIN 25 tablet 11   olmesartan-hydrochlorothiazide (BENICAR HCT) 40-25 MG tablet Take 1 tablet by mouth daily. 90 tablet 3   pantoprazole (PROTONIX) 40 MG tablet Take 1 tablet (40 mg total) by mouth daily. 90 tablet 3   rosuvastatin (CRESTOR) 40 MG tablet Take 1 tablet (40 mg total) by mouth every 3 (three) days. NEED OV. 90 tablet 0   terbinafine (LAMISIL) 1 % cream Apply 1 application topically 2 (two) times daily. (Patient taking differently: Apply 1 application topically 2 (two) times daily. As needed) 30 g 0   No current facility-administered medications on file prior to visit.       Allergies  Allergen Reactions   Brilinta [Ticagrelor]     Rash and SOB     Liraglutide Other (See Comments)    Abdominal pain and enlarged pancreas on CT ? pancreatitis   Baxinets [Cetylpyridinium-Benzocaine] Other (See Comments)    Unknown reaction   Celebrex [Celecoxib] Nausea Only   Toprol Xl [Metoprolol Succinate] Other (See Comments)    Unknown reaction - pt states it did not work   Triplex Ad Other (See Comments)    Abdominal pain and enlarged pancreas on CT? pancreatitis   Welchol [Colesevelam Hcl] Other (See Comments)    constipation   Norvasc [Amlodipine Besylate] Rash   Statins Other (See Comments)    muscle and joint pain if taken for long periods of time, takes Crestor every 2-3 days to avoid this reaction    Assessment/Plan:  REPATHA Approval is good for 11/25/20-11/25/21 Per [facility] [phone]     1. Hyperlipidemia -  Continue repatha Approval granted for 1 year Continue rosuvastatin 3 times weekly as tolerated (ICD10 G72 for drug induced myopathy) Labs reviewed; significant decrease since start of Repatha Reviewed medications Refills provided Discussed and sent to PCP  Regina Eck, PharmD, BCPS Clinical Pharmacist, [place]  [facility] Phone [phone]

## 2020-12-01 NOTE — Telephone Encounter (Signed)
Called patient today--repatha is being shipped to his home today Approved until 11/25/21 Will close encounter

## 2020-12-08 ENCOUNTER — Ambulatory Visit: Payer: Medicare Other | Admitting: Nurse Practitioner

## 2020-12-09 ENCOUNTER — Other Ambulatory Visit: Payer: Self-pay

## 2020-12-09 ENCOUNTER — Ambulatory Visit (INDEPENDENT_AMBULATORY_CARE_PROVIDER_SITE_OTHER): Payer: Medicare Other | Admitting: Family Medicine

## 2020-12-09 ENCOUNTER — Encounter: Payer: Self-pay | Admitting: Family Medicine

## 2020-12-09 DIAGNOSIS — B349 Viral infection, unspecified: Secondary | ICD-10-CM

## 2020-12-09 MED ORDER — PREDNISONE 10 MG (21) PO TBPK
ORAL_TABLET | ORAL | 0 refills | Status: DC
Start: 2020-12-09 — End: 2021-05-12

## 2020-12-09 NOTE — Progress Notes (Signed)
   Virtual Visit  Note Due to COVID-19 pandemic this visit was conducted virtually. This visit type was conducted due to national recommendations for restrictions regarding the COVID-19 Pandemic (e.g. social distancing, sheltering in place) in an effort to limit this patient's exposure and mitigate transmission in our community. All issues noted in this document were discussed and addressed.  A physical exam was not performed with this format.  I connected with Marvin Frost on 12/09/20 at 1220 by telephone and verified that I am speaking with the correct person using two identifiers. Marvin Frost is currently located at home and his wife is currently with him during the visit. The provider, Gwenlyn Perking, FNP is located in their office at time of visit.  I discussed the limitations, risks, security and privacy concerns of performing an evaluation and management service by telephone and the availability of in person appointments. I also discussed with the patient that there may be a patient responsible charge related to this service. The patient expressed understanding and agreed to proceed.  CC: fever  History and Present Illness:  HPI Marvin Frost reports congestion, subjective fever, headache, chills and sweats, decreased appetite, body aches, cough, and sore throat. Reports one episode of nausea. Denies chest pain, shortness of breath, vomiting, or diarrhea. He thinks he may have been exposed to Covid at church. He has been vaccinated and had a booster. He has been taking tylenol and mucinex for some improvement.     ROS As per HPI.   Observations/Objective: Alert and oriented x 3. Able to speak in full sentences without difficulty.    Assessment and Plan: Nil was seen today for fever.  Diagnoses and all orders for this visit:  Viral infection Covid test pending, quarantine until result. Patient may qualify for antivirals if positive result. Prednisone pack as below. Continue mucinex and  tylenol. Rest, stay well hydrated. Return to office for new or worsening symptoms, or if symptoms persist.  -     predniSONE (STERAPRED UNI-PAK 21 TAB) 10 MG (21) TBPK tablet; Use as directed on back of pill pack -     Novel Coronavirus, NAA ([facility]); Future    Follow Up Instructions: As needed.     I discussed the assessment and treatment plan with the patient. The patient was provided an opportunity to ask questions and all were answered. The patient agreed with the plan and demonstrated an understanding of the instructions.   The patient was advised to call back or seek an in-person evaluation if the symptoms worsen or if the condition fails to improve as anticipated.  The above assessment and management plan was discussed with the patient. The patient verbalized understanding of and has agreed to the management plan. Patient is aware to call the clinic if symptoms persist or worsen. Patient is aware when to return to the clinic for a follow-up visit. Patient educated on when it is appropriate to go to the emergency department.   Time call ended:  1232  I provided 12 minutes of  non face-to-face time during this encounter.    Gwenlyn Perking, FNP

## 2020-12-10 ENCOUNTER — Other Ambulatory Visit: Payer: Self-pay | Admitting: Family Medicine

## 2020-12-10 ENCOUNTER — Telehealth: Payer: Self-pay | Admitting: Family Medicine

## 2020-12-10 DIAGNOSIS — U071 COVID-19: Secondary | ICD-10-CM

## 2020-12-10 LAB — SARS-COV-2, NAA 2 DAY TAT

## 2020-12-10 LAB — NOVEL CORONAVIRUS, NAA: SARS-CoV-2, NAA: DETECTED — AB

## 2020-12-10 MED ORDER — MOLNUPIRAVIR EUA 200MG CAPSULE
4.0000 | ORAL_CAPSULE | Freq: Two times a day (BID) | ORAL | 0 refills | Status: AC
Start: 2020-12-10 — End: 2020-12-15

## 2020-12-10 NOTE — Telephone Encounter (Signed)
PLEASE ADVISE COVID TEST IT IS +

## 2020-12-10 NOTE — Telephone Encounter (Signed)
Just completed result note. I sent in molnipravir for him

## 2020-12-10 NOTE — Telephone Encounter (Signed)
Patient aware and verbalized understanding. °

## 2020-12-13 ENCOUNTER — Ambulatory Visit: Payer: Medicare Other | Admitting: Nurse Practitioner

## 2020-12-30 ENCOUNTER — Ambulatory Visit (INDEPENDENT_AMBULATORY_CARE_PROVIDER_SITE_OTHER): Payer: Medicare Other | Admitting: Nurse Practitioner

## 2020-12-30 ENCOUNTER — Encounter: Payer: Self-pay | Admitting: Nurse Practitioner

## 2020-12-30 VITALS — BP 126/82 | HR 77 | Ht 68.0 in | Wt 207.0 lb

## 2020-12-30 DIAGNOSIS — E1159 Type 2 diabetes mellitus with other circulatory complications: Secondary | ICD-10-CM

## 2020-12-30 DIAGNOSIS — I10 Essential (primary) hypertension: Secondary | ICD-10-CM

## 2020-12-30 DIAGNOSIS — E782 Mixed hyperlipidemia: Secondary | ICD-10-CM

## 2020-12-30 LAB — POCT UA - MICROALBUMIN
Albumin/Creatinine Ratio, Urine, POC: <30
Creatinine, POC: 200 mg/dL
Microalbumin Ur, POC: 10 mg/L

## 2020-12-30 MED ORDER — TRULICITY 4.5 MG/0.5ML ~~LOC~~ SOAJ: 4.0000 mg | SUBCUTANEOUS | 3 refills | Status: DC

## 2020-12-30 NOTE — Progress Notes (Signed)
12/30/2020, 3:58 PM     Endocrinology follow-up note   Subjective:    Patient ID: Marvin Frost, male    DOB: January 13, 1946.  Marvin Frost is being seen in follow-up after he was seen in consultation for management of currently uncontrolled symptomatic diabetes. -He also has dyslipidemia, hypertension. PMD:   [name], MD.   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    Cataract    both removed   Diabetes mellitus 1995   Dysrhythmia    history of heart palpitations    Gastric polyps 09/18/2017   GERD (gastroesophageal reflux disease)    History of frequent urinary tract infections    History of hiatal hernia    Hyperlipemia    Hypertension    Obesity    Pancreatitis     Past Surgical History:  Procedure Laterality Date   CARDIAC CATHETERIZATION     2004 and 1999- done by Dr Percival Spanish    COLONOSCOPY  11/2003   diverticulosis, no polyps   CORONARY STENT INTERVENTION N/A 05/28/2018   Procedure: CORONARY STENT INTERVENTION;  Surgeon: Sherren Mocha, MD;  Location: [facility];  Service: Cardiovascular;  Laterality: N/A;   EYE SURGERY Bilateral    cateracts - dr Katy Fitch   HYDROCELE EXCISION / REPAIR     INTRAVASCULAR PRESSURE WIRE/FFR STUDY N/A 05/28/2018   Procedure: INTRAVASCULAR PRESSURE WIRE/FFR STUDY;  Surgeon: Sherren Mocha, MD;  Location: [facility];  Service: Cardiovascular;  Laterality: N/A;   left calf surgery      related to trauma of being torn    LEFT HEART CATH AND CORONARY ANGIOGRAPHY N/A 05/28/2018   Procedure: LEFT HEART CATH AND CORONARY ANGIOGRAPHY;  Surgeon: Sherren Mocha, MD;  Location: [facility];  Service: Cardiovascular;  Laterality: N/A;   left index finger arthrioscopic surgery      lymph node  removed from right neck      right shoulder surgery     TOTAL HIP ARTHROPLASTY Right 03/16/2015   Procedure: RIGHT TOTAL HIP ARTHROPLASTY ANTERIOR APPROACH;  Surgeon: Paralee Cancel, MD;  Location: [facility];  Service: Orthopedics;  Laterality: Right;   TOTAL HIP ARTHROPLASTY Left 06/22/2015   Procedure: LEFT TOTAL HIP ARTHROPLASTY ANTERIOR APPROACH;  Surgeon: Paralee Cancel, MD;  Location: [facility];  Service: Orthopedics;  Laterality: Left;   UPPER GASTROINTESTINAL ENDOSCOPY     hiatal hernia 2cm, mild gastritis - 2012, multiple hyperplastic and fundic gland gastric polyps 2019    Social History   Socioeconomic History   Marital status: Married    Spouse name: [name]   Number of children: 1   Years of education: Some College   Highest education level: Associate degree: occupational, Hotel manager, or vocational program  Occupational History   Occupation: Best boy: [employer]    Comment: retired  Tobacco Use   Smoking status: Former  Packs/day: 1.00    Years: 10.00    Pack years: 10.00    Types: Cigarettes    Quit date: 04/06/1984    Years since quitting: 36.7   Smokeless tobacco: Former    Types: Chew    Quit date: 06/26/1978  Vaping Use   Vaping Use: Never used  Substance and Sexual Activity   Alcohol use: No   Drug use: No   Sexual activity: Yes  Other Topics Concern   Not on file  Social History Narrative   Married   Retired from [employer]   1 child   Former but no current tobacco   No EtOH/drugs   Social Determinants of Radio broadcast assistant Strain: Not on file  Food Insecurity: Not on file  Transportation Needs: Not on file  Physical Activity: Not on file  Stress: Not on file  Social Connections: Not on file    Family History  Problem Relation Age of Onset   Heart disease Mother    Heart attack Mother    Diabetes Brother    Heart disease Brother    Heart attack Brother 49   Heart attack Father 17   GI problems Sister        torn  esophagus during endoscopy   Healthy Daughter    Colon polyps Neg Hx    Esophageal cancer Neg Hx    Rectal cancer Neg Hx    Stomach cancer Neg Hx    Colon cancer Neg Hx     Outpatient Encounter Medications as of 12/30/2020  Medication Sig   Dulaglutide (TRULICITY) 4.5 GX/2.1JH SOPN Inject 4 mg into the skin once a week.   amoxicillin (AMOXIL) 500 MG capsule Take 4 capsules (2,000 mg total) by mouth See admin instructions. Take four capsules (2000 mg) by mouth one hour prior to dental appointments   aspirin EC 81 MG EC tablet Take 1 tablet (81 mg total) by mouth daily.   Blood Glucose Monitoring Suppl (FREESTYLE LITE) DEVI Use to check BS bid. DX. E11.9   cetirizine (ZYRTEC) 10 MG tablet Take 10 mg by mouth as needed for allergies or rhinitis.    Continuous Blood Gluc Sensor (FREESTYLE LIBRE 14 DAY SENSOR) MISC Inject 1 each into the skin every 14 (fourteen) days. Use as directed.   Evolocumab (REPATHA SURECLICK) 417 MG/ML SOAJ Inject 140 mg into the skin every 14 (fourteen) days.   ferrous sulfate 325 (65 FE) MG tablet Take 1 tablet (325 mg total) by mouth every other day. (Patient taking differently: Take 325 mg by mouth every morning.)   gabapentin (NEURONTIN) 300 MG capsule Take by mouth in the morning and at bedtime.   insulin degludec (TRESIBA FLEXTOUCH) 200 UNIT/ML FlexTouch Pen Inject 72 Units into the skin at bedtime.   Insulin Pen Needle (NOVOTWIST) 32G X 5 MM MISC USE WITH INSULIN PENS UP TO 5 TIMES DAILY Dx E11.59   Lancets (FREESTYLE) lancets Check BS BID and PRN. DX.E11.9   metFORMIN (GLUMETZA) 1000 MG (MOD) 24 hr tablet Take 1 tablet (1,000 mg total) by mouth daily with breakfast.   metoprolol succinate (TOPROL-XL) 100 MG 24 hr tablet TAKE 1 TABLET DAILY FOR BLOOD PRESSURE CONTROL   nitroGLYCERIN (NITROSTAT) 0.4 MG SL tablet DISSOLVE 1 TABLET UNDER THE TONGUE EVERY 5 MINUTES AS NEEDED FOR CHEST PAIN   olmesartan-hydrochlorothiazide (BENICAR HCT) 40-25 MG tablet Take 1 tablet by  mouth daily.   pantoprazole (PROTONIX) 40 MG tablet Take 1 tablet (40 mg total) by  mouth daily.   predniSONE (STERAPRED UNI-PAK 21 TAB) 10 MG (21) TBPK tablet Use as directed on back of pill pack   rosuvastatin (CRESTOR) 40 MG tablet Take 1 tablet (40 mg total) by mouth every 3 (three) days.   terbinafine (LAMISIL) 1 % cream Apply 1 application topically 2 (two) times daily. (Patient taking differently: Apply 1 application topically 2 (two) times daily. As needed)   [DISCONTINUED] Dulaglutide (TRULICITY) 3 TD/1.7OH SOPN Inject 3 mg as directed once a week.   No facility-administered encounter medications on file as of 12/30/2020.    ALLERGIES: Allergies  Allergen Reactions   Brilinta [Ticagrelor]     Rash and SOB    Liraglutide Other (See Comments)    Abdominal pain and enlarged pancreas on CT ? pancreatitis   Baxinets [Cetylpyridinium-Benzocaine] Other (See Comments)    Unknown reaction   Celebrex [Celecoxib] Nausea Only   Toprol Xl [Metoprolol Succinate] Other (See Comments)    Unknown reaction - pt states it did not work   Triplex Ad Other (See Comments)    Abdominal pain and enlarged pancreas on CT? pancreatitis   Welchol [Colesevelam Hcl] Other (See Comments)    constipation   Norvasc [Amlodipine Besylate] Rash   Statins Other (See Comments)    muscle and joint pain if taken for long periods of time, takes Crestor every 2-3 days to avoid this reaction    VACCINATION STATUS: Immunization History  Administered Date(s) Administered   Fluad Quad(high Dose 65+) 03/26/2019, 04/19/2020   Influenza Whole 02/24/2010   Influenza, High Dose Seasonal PF 04/15/2015, 04/12/2016, 04/05/2017, 04/16/2018   Influenza,inj,Quad PF,6+ Mos 03/26/2013, 03/30/2014   Moderna Sars-Covid-2 Vaccination 07/31/2019, 08/28/2019, 03/15/2020   Pneumococcal Conjugate-13 06/17/2013   Pneumococcal Polysaccharide-23 02/24/2009   Td 02/24/2006   Tdap 04/27/2011   Zoster Recombinat (Shingrix) 07/22/2018    Zoster, Live 07/27/2006    Diabetes He presents for his follow-up diabetic visit. He has type 2 diabetes mellitus. Onset time: He was diagnosed at approximate age of 79 years. His disease course has been improving. There are no hypoglycemic associated symptoms. Pertinent negatives for hypoglycemia include no confusion, headaches, pallor or seizures. Pertinent negatives for diabetes include no chest pain, no fatigue, no polydipsia, no polyphagia, no polyuria and no weakness. There are no hypoglycemic complications. Symptoms are stable. Diabetic complications include heart disease and nephropathy. Risk factors for coronary artery disease include diabetes mellitus, dyslipidemia, family history, male sex, hypertension, sedentary lifestyle, tobacco exposure and obesity. Current diabetic treatment includes oral agent (monotherapy) and insulin injections. He is compliant with treatment most of the time. His weight is decreasing steadily. He is following a generally healthy diet. When asked about meal planning, he reported none. He has not had a previous visit with a dietitian. He participates in exercise intermittently. His home blood glucose trend is decreasing steadily. His overall blood glucose range is 140-180 mg/dl. (He presents today with his CGM and logs showing at goal fasting and postprandial glycemic profile.  His previsit A1c was 8.6 on 5/16.  He attributes this worsening number to a course of steroids for his neck pain.  He has since gotten back on track with his glucose numbers.  Analysis of his CGM shows TIR 67%, TAR 33%, TBR 0%.  He denies any hypoglycemia.  His PCP did increase his dose of Trulicity to 3 mg since last visit and he is taking more insulin than last visit at 72 units.) An ACE inhibitor/angiotensin II receptor blocker is being taken. He does  not see a podiatrist.Eye exam is current.  Hyperlipidemia This is a chronic problem. The current episode started more than 1 year ago. The problem is  controlled. Recent lipid tests were reviewed and are normal. Exacerbating diseases include chronic renal disease, diabetes and obesity. Factors aggravating his hyperlipidemia include beta blockers. Pertinent negatives include no chest pain, myalgias or shortness of breath. Current antihyperlipidemic treatment includes statins (Repatha every 2 weeks. Only takes his statin once every few days dt myalgias). The current treatment provides moderate improvement of lipids. Compliance problems include medication side effects.  Risk factors for coronary artery disease include male sex, obesity, family history, dyslipidemia, diabetes mellitus, hypertension and a sedentary lifestyle.  Hypertension This is a chronic problem. The current episode started more than 1 year ago. The problem has been gradually improving since onset. The problem is controlled. Pertinent negatives include no chest pain, headaches, neck pain, palpitations or shortness of breath. There are no associated agents to hypertension. Risk factors for coronary artery disease include dyslipidemia, diabetes mellitus, family history, male gender, obesity, sedentary lifestyle and smoking/tobacco exposure. Past treatments include angiotensin blockers, diuretics and beta blockers. The current treatment provides moderate improvement. There are no compliance problems.  Hypertensive end-organ damage includes CAD/MI. Identifiable causes of hypertension include chronic renal disease.   Review of systems  Constitutional: + steadily decreasing body weight,  current Body mass index is 31.47 kg/m. , no fatigue, no subjective hyperthermia, no subjective hypothermia Eyes: no blurry vision, no xerophthalmia ENT: no sore throat, no nodules palpated in throat, no dysphagia/odynophagia, no hoarseness Cardiovascular: no chest pain, no shortness of breath, no palpitations, no leg swelling Respiratory: no cough, no shortness of breath Gastrointestinal: no  nausea/vomiting/diarrhea Musculoskeletal: no muscle/joint aches, complains of neck pain - sees neurosurgery- avoiding surgery at this time. Skin: no rashes, no hyperemia Neurological: no tremors, no numbness, no tingling, no dizziness Psychiatric: no depression, no anxiety   Objective:    BP 126/82   Pulse 77   Ht 5\' 8"  (1.727 m)   Wt 207 lb (93.9 kg)   BMI 31.47 kg/m   Wt Readings from Last 3 Encounters:  12/30/20 207 lb (93.9 kg)  11/08/20 209 lb 12.8 oz (95.2 kg)  10/06/20 212 lb 9.6 oz (96.4 kg)    BP Readings from Last 3 Encounters:  12/30/20 126/82  11/08/20 119/66  10/06/20 118/65      Physical Exam- Limited  Constitutional:  Body mass index is 31.47 kg/m. , not in acute distress, normal state of mind Eyes:  EOMI, no exophthalmos Neck: Supple Cardiovascular: RRR, no murmurs, rubs, or gallops, no edema Respiratory: Adequate breathing efforts, no crackles, rales, rhonchi, or wheezing Musculoskeletal: no gross deformities, strength intact in all four extremities, no gross restriction of joint movements Skin:  no rashes, no hyperemia Neurological: no tremor with outstretched hands   CMP     Component Value Date/Time   NA 138 11/08/2020 1304   K 4.7 11/08/2020 1304   CL 98 11/08/2020 1304   CO2 24 11/08/2020 1304   GLUCOSE 91 11/08/2020 1304   GLUCOSE 154 (H) 05/29/2018 0217   BUN 13 11/08/2020 1304   CREATININE 0.98 11/08/2020 1304   CREATININE 0.95 12/31/2012 0857   CALCIUM 9.1 11/08/2020 1304   PROT 6.7 11/08/2020 1304   ALBUMIN 4.4 11/08/2020 1304   AST 15 11/08/2020 1304   ALT 16 11/08/2020 1304   ALKPHOS 63 11/08/2020 1304   BILITOT 0.4 11/08/2020 1304   GFRNONAA 68 07/07/2020 1028  GFRNONAA 81 12/31/2012 0857   GFRAA 78 07/07/2020 1028   GFRAA >89 12/31/2012 0857    Diabetic Labs (most recent): Lab Results  Component Value Date   HGBA1C 8.6 (H) 11/08/2020   HGBA1C 7.9 (H) 07/07/2020   HGBA1C 6.8 (A) 03/24/2020     Lipid Panel ( most  recent) Lipid Panel     Component Value Date/Time   CHOL 140 11/08/2020 1304   CHOL 157 12/31/2012 0857   TRIG 128 11/08/2020 1304   TRIG 271 (H) 02/17/2016 0824   TRIG 184 (H) 12/31/2012 0857   HDL 46 11/08/2020 1304   HDL 44 02/17/2016 0824   HDL 45 12/31/2012 0857   CHOLHDL 3.0 11/08/2020 1304   LDLCALC 71 11/08/2020 1304   LDLCALC 52 01/19/2014 1007   LDLCALC 75 12/31/2012 0857   LDLDIRECT 39 11/02/2016 0806      Lab Results  Component Value Date   TSH 3.350 07/07/2020   TSH 2.720 12/30/2019   TSH 2.290 09/03/2014   FREET4 1.42 07/07/2020     Assessment & Plan:   1) DM type 2 causing vascular disease ([place])  - Norvin Ohlin has currently uncontrolled symptomatic type 2 DM since  75 years of age.  He presents today with his CGM and logs showing at goal fasting and postprandial glycemic profile.  His previsit A1c was 8.6 on 5/16.  He attributes this worsening number to a course of steroids for his neck pain.  He has since gotten back on track with his glucose numbers.  Analysis of his CGM shows TIR 67%, TAR 33%, TBR 0%.  He denies any hypoglycemia.  His PCP did increase his dose of Trulicity to 3 mg since last visit and he is taking more insulin than last visit at 72 units.  -I have reviewed his recent labs with him.  - I had a long discussion with him about the progressive nature of diabetes and the pathology behind its complications. -his diabetes is complicated by coronary artery disease, obesity and he remains at a high risk for more acute and chronic complications which include CAD, CVA, CKD, retinopathy, and neuropathy. These are all discussed in detail with him.  - Nutritional counseling repeated at each appointment due to patients tendency to fall back in to old habits.  - The patient admits there is a room for improvement in their diet and drink choices. -  Suggestion is made for the patient to avoid simple carbohydrates from their diet including Cakes, Sweet  Desserts / Pastries, Ice Cream, Soda (diet and regular), Sweet Tea, Candies, Chips, Cookies, Sweet Pastries,  Store Bought Juices, Alcohol in Excess of  1-2 drinks a day, Artificial Sweeteners, Coffee Creamer, and "Sugar-free" Products. This will help patient to have stable blood glucose profile and potentially avoid unintended weight gain.   - I encouraged the patient to switch to  unprocessed or minimally processed complex starch and increased protein intake (animal or plant source), fruits, and vegetables.   - Patient is advised to stick to a routine mealtimes to eat 3 meals  a day and avoid unnecessary snacks ( to snack only to correct hypoglycemia).  - he is scheduled with Jearld Fenton, RDN, CDE for individualized diabetes education.  - I have approached him with the following individualized plan to manage diabetes and patient agrees:   - He has done well without use of bolus insulin and is advised to stay off of it at this time.  - Given his stable glycemic  profile, he is advised to continue Tresiba 72 units SQ daily at bedtime, he is advised to increase his Trulicity to 4.5 mg SQ weekly (the 3 mg dose is meant to be a stepping stone between the 1.5 and the 4.5 as it is not much more effective than the 1.5 mg dose) continue Trulicity 1.5 mg SQ weekly, and Metformin 1000 mg XR daily with breakfast.   -He is encouraged to continue monitoring blood glucose using his CGM at least 4 times per day, before meals and at bedtime and call the clinic if he gets readings less than 70 or greater than 200 for 3 tests in a row.  - Patient specific target  A1c;  LDL, HDL, Triglycerides,  were discussed in detail.  2) Blood Pressure /Hypertension:   His blood pressure is controlled to target.  He is advised to continue Metoprolol 100 mg po daily and Benicar HCT 40-25 mg po daily  3) Lipids/Hyperlipidemia: His most recent lipid panel from 07/07/20 shows uncontrolled LDL of 147 and elevated triglycerides  of 385 (both worsening significantly since last check).  His PCP told him to stop his Crestor in between visits which may be contributing to his recent rebound in cholesterol.  He is advised to resume taking his Crestor 40 mg po every 3 days or so and continue his Repatha 140 mg SQ every 2 weeks.  Side effects and precautions discussed with him.    4)  Weight/Diet:  His Body mass index is 31.47 kg/m.--   He has lost some weight over the last few visits and will benefit from continued weight loss.  I discussed with him the fact that loss of 5 - 10% of his  current body weight will have the most impact on his diabetes management.  CDE Consult will be initiated . Exercise, and detailed carbohydrates information provided  -  detailed on discharge instructions.  5) Chronic Care/Health Maintenance: -he is on ACEI/ARB and Statin medications and is encouraged to initiate and continue to follow up with Ophthalmology, Dentist, Podiatrist at least yearly or according to recommendations, and advised to  stay away from smoking. I have recommended yearly flu vaccine and pneumonia vaccine at least every 5 years; moderate intensity exercise for up to 150 minutes weekly; and  sleep for at least 7 hours a day.  - he is  advised to maintain close follow up with [name], MD for primary care needs, as well as his other providers for optimal and coordinated care.    I spent 40 minutes in the care of the patient today including review of labs from [facility], Lipids, Thyroid Function, Hematology (current and previous including abstractions from other facilities); face-to-face time discussing  his blood glucose readings/logs, discussing hypoglycemia and hyperglycemia episodes and symptoms, medications doses, his options of short and long term treatment based on the latest standards of care / guidelines;  discussion about incorporating lifestyle medicine;  and documenting the encounter.    Please refer to Patient Instructions  for Blood Glucose Monitoring and Insulin/Medications Dosing Guide"  in media tab for additional information. Please  also refer to " Patient Self Inventory" in the Media  tab for reviewed elements of pertinent patient history.  [name] participated in the discussions, expressed understanding, and voiced agreement with the above plans.  All questions were answered to his satisfaction. he is encouraged to contact clinic should he have any questions or concerns prior to his return visit.    Follow up plan: -  Return in about 6 months (around 07/02/2021) for Diabetes F/U with A1c in office, No previsit labs, Bring meter and logs.    Rayetta Pigg, [facility] [address] [place], [place] [address] Phone: [phone] Fax: [phone]  12/30/2020, 3:58 PM

## 2020-12-30 NOTE — Patient Instructions (Signed)

## 2021-01-03 ENCOUNTER — Telehealth: Payer: Self-pay | Admitting: Nurse Practitioner

## 2021-01-03 NOTE — Telephone Encounter (Signed)
Pt called and said he received a letter from [facility] stating they are needing more information before they can fill his rx that was sent over. He believes it is needing a PA. [facility] gave him the number [phone] to contact.   Dulaglutide (TRULICITY) 4.5 FE/0.7HQ SOPN

## 2021-01-03 NOTE — Telephone Encounter (Signed)
I havent seen any PA requests yet on my end.

## 2021-01-03 NOTE — Telephone Encounter (Signed)
This was a clarification which has been faxed back to [facility].

## 2021-02-08 ENCOUNTER — Other Ambulatory Visit: Payer: Self-pay

## 2021-02-08 ENCOUNTER — Encounter: Payer: Self-pay | Admitting: Family Medicine

## 2021-02-08 ENCOUNTER — Ambulatory Visit (INDEPENDENT_AMBULATORY_CARE_PROVIDER_SITE_OTHER): Payer: Medicare Other | Admitting: Family Medicine

## 2021-02-08 VITALS — BP 130/73 | HR 74 | Temp 97.7°F | Ht 68.0 in | Wt 206.6 lb

## 2021-02-08 DIAGNOSIS — I10 Essential (primary) hypertension: Secondary | ICD-10-CM | POA: Diagnosis not present

## 2021-02-08 DIAGNOSIS — E1159 Type 2 diabetes mellitus with other circulatory complications: Secondary | ICD-10-CM | POA: Diagnosis not present

## 2021-02-08 DIAGNOSIS — E782 Mixed hyperlipidemia: Secondary | ICD-10-CM

## 2021-02-08 LAB — CMP14+EGFR
ALT: 18 IU/L (ref 0–44)
AST: 16 IU/L (ref 0–40)
Albumin/Globulin Ratio: 1.9 (ref 1.2–2.2)
Albumin: 4.1 g/dL (ref 3.7–4.7)
Alkaline Phosphatase: 56 IU/L (ref 44–121)
BUN/Creatinine Ratio: 11 (ref 10–24)
BUN: 10 mg/dL (ref 8–27)
Bilirubin Total: 0.3 mg/dL (ref 0.0–1.2)
CO2: 25 mmol/L (ref 20–29)
Calcium: 9.1 mg/dL (ref 8.6–10.2)
Chloride: 98 mmol/L (ref 96–106)
Creatinine, Ser: 0.94 mg/dL (ref 0.76–1.27)
Globulin, Total: 2.2 g/dL (ref 1.5–4.5)
Glucose: 276 mg/dL — ABNORMAL HIGH (ref 65–99)
Potassium: 4.6 mmol/L (ref 3.5–5.2)
Sodium: 136 mmol/L (ref 134–144)
Total Protein: 6.3 g/dL (ref 6.0–8.5)
eGFR: 83 mL/min/{1.73_m2} (ref >59)

## 2021-02-08 LAB — CBC WITH DIFFERENTIAL/PLATELET
Basophils Absolute: 0.1 10*3/uL (ref 0.0–0.2)
Basos: 1 %
EOS (ABSOLUTE): 0.3 10*3/uL (ref 0.0–0.4)
Eos: 4 %
Hematocrit: 44.7 % (ref 37.5–51.0)
Hemoglobin: 14.7 g/dL (ref 13.0–17.7)
Immature Grans (Abs): 0.1 10*3/uL (ref 0.0–0.1)
Immature Granulocytes: 1 %
Lymphocytes Absolute: 1.3 10*3/uL (ref 0.7–3.1)
Lymphs: 15 %
MCH: 29.6 pg (ref 26.6–33.0)
MCHC: 32.9 g/dL (ref 31.5–35.7)
MCV: 90 fL (ref 79–97)
Monocytes Absolute: 0.6 10*3/uL (ref 0.1–0.9)
Monocytes: 6 %
Neutrophils Absolute: 6.3 10*3/uL (ref 1.4–7.0)
Neutrophils: 73 %
Platelets: 211 10*3/uL (ref 150–450)
RBC: 4.96 x10E6/uL (ref 4.14–5.80)
RDW: 14.2 % (ref 11.6–15.4)
WBC: 8.6 10*3/uL (ref 3.4–10.8)

## 2021-02-08 LAB — LIPID PANEL
Chol/HDL Ratio: 3.3 ratio (ref 0.0–5.0)
Cholesterol, Total: 130 mg/dL (ref 100–199)
HDL: 40 mg/dL (ref >39)
LDL Chol Calc (NIH): 56 mg/dL (ref 0–99)
Triglycerides: 207 mg/dL — ABNORMAL HIGH (ref 0–149)
VLDL Cholesterol Cal: 34 mg/dL (ref 5–40)

## 2021-02-08 LAB — BAYER DCA HB A1C WAIVED: HB A1C (BAYER DCA - WAIVED): 7.9 % — ABNORMAL HIGH (ref <7.0)

## 2021-02-08 MED ORDER — AMOXICILLIN 500 MG PO CAPS: 2000.0000 mg | ORAL_CAPSULE | ORAL | 10 refills | Status: DC

## 2021-02-08 NOTE — Progress Notes (Signed)
Subjective:  Patient ID: Marvin Frost, male    DOB: 12-Nov-1945  Age: 75 y.o. MRN: [id number]  CC: Medical Management of Chronic Issues   HPI Demetrick Eichenberger presents forFollow-up of diabetes. Patient checks blood sugar at home.   115-140 fasting and close to 200 postprandial Patient denies symptoms such as polyuria, polydipsia, excessive hunger, nausea No significant hypoglycemic spells noted. Medications reviewed. Pt reports taking them regularly without complication/adverse reaction being reported today.   Seeing Dr. Carolynn Sayers in October for DM eye check.    History Robel has a past medical history of Allergy, Anemia, Arthritis, CAD (coronary artery disease), Cancer ([employer]), Cataract, Diabetes mellitus (1995), Dysrhythmia, Gastric polyps (09/18/2017), GERD (gastroesophageal reflux disease), History of frequent urinary tract infections, History of hiatal hernia, Hyperlipemia, Hypertension, Obesity, and Pancreatitis.   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   His family history includes Diabetes in his brother; GI problems in his sister; Healthy in his daughter; Heart attack in his mother; Heart attack (age of onset: 46) in his father; Heart attack (age of onset: 36) in his brother; Heart disease in his brother and mother.He reports that he quit smoking about 36 years ago. His smoking use included cigarettes. He has a 10.00 pack-year smoking history. He quit smokeless tobacco use about 42 years ago.   His smokeless tobacco use included chew. He reports that he does not drink alcohol and does not use drugs.  Current Outpatient Medications on File Prior to Visit  Medication Sig Dispense Refill   aspirin EC 81 MG EC tablet Take 1 tablet (81 mg total) by mouth daily.     Blood Glucose Monitoring Suppl (FREESTYLE LITE) DEVI Use to check BS bid. DX. E11.9 1 each 0   cetirizine (ZYRTEC) 10 MG tablet Take 10 mg by mouth as needed for allergies or rhinitis.      Continuous Blood Gluc Sensor (FREESTYLE LIBRE 14 DAY SENSOR) MISC Inject 1 each into the skin every 14 (fourteen) days. Use as directed. 6 each 2   Dulaglutide (TRULICITY) 4.5 NK/5.3ZJ SOPN Inject 4 mg into the skin once a week. 6 mL 3   Evolocumab (REPATHA SURECLICK) 673 MG/ML SOAJ Inject 140 mg into the skin every 14 (fourteen) days. 6 mL 4   ferrous sulfate 325 (65 FE) MG tablet Take 1 tablet (325 mg total) by mouth every other day. (Patient taking differently: Take 325 mg by mouth every morning.) 90 tablet 1   gabapentin (NEURONTIN) 300 MG capsule Take by mouth in the morning and at bedtime.     insulin degludec (TRESIBA FLEXTOUCH) 200 UNIT/ML FlexTouch Pen Inject 72 Units into the skin at bedtime. 30 mL 5   Insulin Pen Needle (NOVOTWIST) 32G X 5 MM MISC USE WITH INSULIN PENS UP TO 5 TIMES DAILY Dx E11.59 500 each 4   Lancets (FREESTYLE) lancets Check BS BID and PRN. DX.E11.9 300 each 3   metFORMIN (GLUMETZA) 1000 MG (MOD) 24 hr tablet Take 1 tablet (1,000 mg total) by mouth daily with breakfast. 90 tablet 3   metoprolol succinate (TOPROL-XL) 100 MG 24 hr tablet TAKE 1 TABLET DAILY  FOR BLOOD PRESSURE CONTROL 90 tablet 1   nitroGLYCERIN (NITROSTAT) 0.4 MG SL tablet DISSOLVE 1 TABLET UNDER THE TONGUE EVERY 5 MINUTES AS NEEDED FOR CHEST PAIN 25 tablet 11   olmesartan-hydrochlorothiazide (BENICAR HCT) 40-25 MG tablet Take 1 tablet by mouth daily. 90 tablet 3   pantoprazole (PROTONIX) 40 MG tablet Take 1 tablet (40 mg total) by mouth daily. 90  tablet 3   predniSONE (STERAPRED UNI-PAK 21 TAB) 10 MG (21) TBPK tablet Use as directed on back of pill pack 21 tablet 0   rosuvastatin (CRESTOR) 40 MG tablet Take 1 tablet (40 mg total) by mouth every 3 (three) days. 90 tablet 1   terbinafine (LAMISIL) 1 % cream Apply 1 application topically 2 (two) times daily. (Patient taking differently: Apply 1 application topically 2 (two) times daily. As needed) 30 g 0   No current facility-administered medications on file prior to visit.    ROS Review of Systems  Constitutional:  Negative for fever.  Respiratory:  Negative for shortness of breath.   Cardiovascular:  Negative for chest pain.  Musculoskeletal:  Negative for arthralgias.  Skin:  Negative for rash.   Objective:  BP 130/73   Pulse 74   Temp 97.7 F (36.5 C)   Ht 5' 8"  (1.727 m)   Wt 206 lb 9.6 oz (93.7 kg)   SpO2 97%   BMI 31.41 kg/m   BP Readings from Last 3 Encounters:  02/08/21 130/73  12/30/20 126/82  11/08/20 119/66    Wt Readings from Last 3 Encounters:  02/08/21 206 lb 9.6 oz (93.7 kg)  12/30/20 207 lb (93.9 kg)  11/08/20 209 lb 12.8 oz (95.2 kg)     Physical Exam Vitals reviewed.  Constitutional:      Appearance: He is well-developed.  HENT:     Head: Normocephalic and atraumatic.     Right Ear: External ear normal.     Left Ear: External ear normal.     Mouth/Throat:     Pharynx: No oropharyngeal exudate or posterior oropharyngeal erythema.  Eyes:     Pupils: Pupils are equal, round, and reactive to light.  Cardiovascular:     Rate and Rhythm: Normal rate and regular rhythm.     Heart sounds: No murmur heard. Pulmonary:     Effort: No respiratory distress.     Breath sounds: Normal breath sounds.  Musculoskeletal:     Cervical back: Normal range of motion and neck supple.  Neurological:     Mental Status: He is alert and oriented to person, place, and time.      Assessment & Plan:   Niv was seen today for medical management of chronic  issues.  Diagnoses and all orders for this visit:  Mixed hyperlipidemia -     Lipid panel  DM type 2 causing vascular disease ([place]) -     Bayer DCA Hb A1c Waived  Essential hypertension, benign -     CBC with Differential/Platelet -     CMP14+EGFR  Other orders -     amoxicillin (AMOXIL) 500 MG capsule; Take 4 capsules (2,000 mg total) by mouth See admin instructions. Take four capsules (2000 mg) by mouth one hour prior to dental appointments     I am having [name] maintain his FreeStyle Lite, cetirizine, freestyle, aspirin, ferrous sulfate, NovoTwist, FreeStyle Libre 14 Day Sensor, olmesartan-hydrochlorothiazide, terbinafine, metFORMIN, pantoprazole, metoprolol succinate, gabapentin, Tresiba FlexTouch, nitroGLYCERIN, rosuvastatin, Repatha SureClick, predniSONE, Trulicity, and amoxicillin.  Meds ordered this encounter  Medications  amoxicillin (AMOXIL) 500 MG capsule    Sig: Take 4 capsules (2,000 mg total) by mouth See admin instructions. Take four capsules (2000 mg) by mouth one hour prior to dental appointments    Dispense:  4 capsule    Refill:  10     Follow-up: Return in about 3 months (around 05/11/2021).  [name], M.D.

## 2021-03-09 ENCOUNTER — Encounter: Payer: Self-pay | Admitting: Internal Medicine

## 2021-03-18 ENCOUNTER — Telehealth: Payer: Self-pay | Admitting: Family Medicine

## 2021-03-18 DIAGNOSIS — I10 Essential (primary) hypertension: Secondary | ICD-10-CM

## 2021-03-18 MED ORDER — METOPROLOL SUCCINATE ER 100 MG PO TB24: ORAL_TABLET | ORAL | 0 refills | Status: DC

## 2021-03-18 NOTE — Telephone Encounter (Signed)
  Prescription Request  03/18/2021  Is this a "Controlled Substance" medicine? no Have you seen your PCP in the last 2 weeks? no If YES, route message to pool  -  If NO, patient needs to be scheduled for appointment.  What is the name of the medication or equipment?metoprolol succinate (TOPROL-XL) 100 MG 24 hr tablet  Have you contacted your pharmacy to request a refill? no  Which pharmacy would you like this sent to? [facility] [phone]  Do we have samples of this medication because pt is out. [facility] and [facility] rest to [facility] order?  Pt asked to send this message to [place].  Patient notified that their request is being sent to the clinical staff for review and that they should receive a response within 2 business days.

## 2021-03-18 NOTE — Telephone Encounter (Signed)
Routed incorrectly Please address refill

## 2021-03-23 ENCOUNTER — Telehealth: Payer: Self-pay | Admitting: Family Medicine

## 2021-03-23 ENCOUNTER — Telehealth: Payer: Self-pay | Admitting: Nurse Practitioner

## 2021-03-23 DIAGNOSIS — I10 Essential (primary) hypertension: Secondary | ICD-10-CM

## 2021-03-23 MED ORDER — METOPROLOL SUCCINATE ER 100 MG PO TB24
ORAL_TABLET | ORAL | 1 refills | Status: DC
Start: 2021-03-23 — End: 2021-05-12

## 2021-03-23 MED ORDER — METFORMIN HCL ER 500 MG PO TB24: 1000.0000 mg | ORAL_TABLET | Freq: Every day | ORAL | 3 refills | Status: DC

## 2021-03-23 NOTE — Telephone Encounter (Signed)
Should be 1000 mg ER daily with breakfast.  Sometimes the script will be written to take 2 of the 500 mg ER tabs (due to variances with insurance coverage).  Ill [facility] for [facility] dose.

## 2021-03-23 NOTE — Telephone Encounter (Signed)
Pt is calling and is needing a refill on Metformin. I see the last we sent in looks like 1000mg . He states he ran out last month and his family doctor sent in a 30 day supply of 500mg . [facility] it is showing 500mg  as well. Pt is unsure what MG he needs to be on but is needing a refill.  [place], [place] Phone:  [phone]  Fax:  [phone]

## 2021-03-23 NOTE — Telephone Encounter (Signed)
LMOVM refill sent into [facility] pharmacy, sorry only part of Fridays message that was addressed was the [facility].

## 2021-03-23 NOTE — Telephone Encounter (Signed)
Informed pt, he just got home and relooked at his bottle and was not looking at it correctly, he has been taking it the wrong way but will start taking the 1000mg 

## 2021-03-23 NOTE — Telephone Encounter (Signed)
  Prescription Request  03/23/2021  Is this a "Controlled Substance" medicine? no Have you seen your PCP in the last 2 weeks?  yes If YES, route message to pool  -  If NO, patient needs to be scheduled for appointment.  What is the name of the medication or equipment? metoprolol succinate (TOPROL-XL) 100 MG 24 hr tablet  Have you contacted your pharmacy to request a refill? Yes, need another prescription sent   Which pharmacy would you like this sent to? [facility]    Patient notified that their request is being sent to the clinical staff for review and that they should receive a response within 2 business days.

## 2021-03-27 NOTE — Progress Notes (Signed)
Cardiology Office Note   Date:  03/29/2021   ID:  Marvin Frost, DOB [birth year], MRN [id number]  PCP:  Marvin Fraise, MD  Cardiologist:   Marvin Breeding, MD   Chief Complaint  Patient presents with   Coronary Artery Disease       History of Present Illness: Marvin Frost is a 75 y.o. male who presents for follow up of CAD.  He had stenting of his mid LAD in December of 2019.   Since I last saw him he has done well.  He stays active doing yardwork although he is not exercising routinely.  The patient denies any new symptoms such as chest discomfort, neck or arm discomfort. There has been no new shortness of breath, PND or orthopnea. There have been no reported palpitations, presyncope or syncope.  He did have 1 episode of hypotension earlier this month when he was outside working hard and he felt lightheaded.  However, this resolved.  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    Cataract    both removed   Diabetes mellitus 1995   Dysrhythmia    history of heart palpitations    Gastric polyps 09/18/2017   GERD (gastroesophageal reflux disease)    History of frequent urinary tract infections    History of hiatal hernia    Hyperlipemia    Hypertension    Obesity    Pancreatitis     Past Surgical History:  Procedure Laterality Date   CARDIAC CATHETERIZATION     2004 and 1999- done by Dr Percival Spanish    COLONOSCOPY  11/2003   diverticulosis, no polyps   CORONARY STENT INTERVENTION N/A 05/28/2018   Procedure: CORONARY STENT INTERVENTION;  Surgeon: Sherren Mocha, MD;  Location: [facility];  Service: Cardiovascular;  Laterality: N/A;   EYE SURGERY Bilateral    cateracts - dr Katy Fitch   HYDROCELE EXCISION / REPAIR     INTRAVASCULAR PRESSURE WIRE/FFR STUDY N/A 05/28/2018   Procedure: INTRAVASCULAR PRESSURE WIRE/FFR STUDY;   Surgeon: Sherren Mocha, MD;  Location: [facility];  Service: Cardiovascular;  Laterality: N/A;   left calf surgery      related to trauma of being torn    LEFT HEART CATH AND CORONARY ANGIOGRAPHY N/A 05/28/2018   Procedure: LEFT HEART CATH AND CORONARY ANGIOGRAPHY;  Surgeon: Sherren Mocha, MD;  Location: [facility];  Service: Cardiovascular;  Laterality: N/A;   left index finger arthrioscopic surgery      lymph node removed from right neck      right shoulder surgery     TOTAL HIP ARTHROPLASTY Right 03/16/2015   Procedure: RIGHT TOTAL HIP ARTHROPLASTY ANTERIOR APPROACH;  Surgeon: Paralee Cancel, MD;  Location: [facility];  Service: Orthopedics;  Laterality: Right;   TOTAL HIP ARTHROPLASTY Left 06/22/2015   Procedure: LEFT TOTAL HIP ARTHROPLASTY ANTERIOR APPROACH;  Surgeon: Paralee Cancel, MD;  Location: [facility];  Service: Orthopedics;  Laterality: Left;   UPPER GASTROINTESTINAL ENDOSCOPY     hiatal hernia 2cm, mild gastritis - 2012, multiple hyperplastic and fundic gland gastric polyps 2019     Current Outpatient Medications  Medication Sig Dispense Refill   amoxicillin (AMOXIL) 500 MG capsule Take 4 capsules (2,000 mg total) by mouth See admin instructions. Take four  capsules (2000 mg) by mouth one hour prior to dental appointments 4 capsule 10   aspirin EC 81 MG EC tablet Take 1 tablet (81 mg total) by mouth daily.     Blood Glucose Monitoring Suppl (FREESTYLE LITE) DEVI Use to check BS bid. DX. E11.9 1 each 0   cetirizine (ZYRTEC) 10 MG tablet Take 10 mg by mouth as needed for allergies or rhinitis.      Continuous Blood Gluc Sensor (FREESTYLE LIBRE 14 DAY SENSOR) MISC Inject 1 each into the skin every 14 (fourteen) days. Use as directed. 6 each 2   Dulaglutide (TRULICITY) 4.5 SK/8.7GO SOPN Inject 4 mg into the skin once a week. 6 mL 3   Evolocumab (REPATHA SURECLICK) 115 MG/ML SOAJ Inject 140 mg into the skin every 14 (fourteen) days. 6 mL 4   ferrous sulfate 325 (65 FE) MG  tablet Take 1 tablet (325 mg total) by mouth every other day. (Patient taking differently: Take 325 mg by mouth every morning.) 90 tablet 1   gabapentin (NEURONTIN) 300 MG capsule Take by mouth in the morning and at bedtime.     insulin degludec (TRESIBA FLEXTOUCH) 200 UNIT/ML FlexTouch Pen Inject 72 Units into the skin at bedtime. 30 mL 5   Insulin Pen Needle (NOVOTWIST) 32G X 5 MM MISC USE WITH INSULIN PENS UP TO 5 TIMES DAILY Dx E11.59 500 each 4   Lancets (FREESTYLE) lancets Check BS BID and PRN. DX.E11.9 300 each 3   metFORMIN (GLUCOPHAGE-XR) 500 MG 24 hr tablet Take 2 tablets (1,000 mg total) by mouth daily with breakfast. 180 tablet 3   metoprolol succinate (TOPROL-XL) 100 MG 24 hr tablet TAKE 1 TABLET DAILY FOR BLOOD PRESSURE CONTROL 90 tablet 1   nitroGLYCERIN (NITROSTAT) 0.4 MG SL tablet DISSOLVE 1 TABLET UNDER THE TONGUE EVERY 5 MINUTES AS NEEDED FOR CHEST PAIN 25 tablet 11   olmesartan-hydrochlorothiazide (BENICAR HCT) 40-25 MG tablet Take 1 tablet by mouth daily. 90 tablet 3   pantoprazole (PROTONIX) 40 MG tablet Take 1 tablet (40 mg total) by mouth daily. 90 tablet 3   predniSONE (STERAPRED UNI-PAK 21 TAB) 10 MG (21) TBPK tablet Use as directed on back of pill pack 21 tablet 0   rosuvastatin (CRESTOR) 40 MG tablet Take 1 tablet (40 mg total) by mouth every 3 (three) days. 90 tablet 1   terbinafine (LAMISIL) 1 % cream Apply 1 application topically 2 (two) times daily. (Patient taking differently: Apply 1 application topically 2 (two) times daily. As needed) 30 g 0   No current facility-administered medications for this visit.    Allergies:   Brilinta [ticagrelor], Liraglutide, Baxinets [cetylpyridinium-benzocaine], Celebrex [celecoxib], Toprol xl [metoprolol succinate], Triplex ad, Welchol [colesevelam hcl], Norvasc [amlodipine besylate], and Statins    ROS:  Please see the history of present illness.   Otherwise, review of systems are positive for none.   All other systems are  reviewed and negative.    PHYSICAL EXAM: VS:  BP 140/78   Ht 5\' 8"  (1.727 m)   Wt 205 lb (93 kg)   BMI 31.17 kg/m  , BMI Body mass index is 31.17 kg/m. GENERAL:  Well appearing NECK:  No jugular venous distention, waveform within normal limits, carotid upstroke brisk and symmetric, no bruits, no thyromegaly LUNGS:  Clear to auscultation bilaterally CHEST:  Unremarkable HEART:  PMI not displaced or sustained,S1 and S2 within normal limits, no S3, no S4, no clicks, no rubs, no murmurs ABD:  Flat, positive bowel sounds normal  in frequency in pitch, no bruits, no rebound, no guarding, no midline pulsatile mass, no hepatomegaly, no splenomegaly EXT:  2 plus pulses throughout, no edema, no cyanosis no clubbing   EKG:  EKG is  ordered today. The ekg ordered today demonstrates sinus rhythm, rate 85, LAD,  intervals within normal limits, no acute ST-T wave changes.  No change previous.   Recent Labs: 07/07/2020: TSH 3.350 02/08/2021: ALT 18; BUN 10; Creatinine, Ser 0.94; Hemoglobin 14.7; Platelets 211; Potassium 4.6; Sodium 136    Lipid Panel    Component Value Date/Time   CHOL 130 02/08/2021 1115   CHOL 157 12/31/2012 0857   TRIG 207 (H) 02/08/2021 1115   TRIG 271 (H) 02/17/2016 0824   TRIG 184 (H) 12/31/2012 0857   HDL 40 02/08/2021 1115   HDL 44 02/17/2016 0824   HDL 45 12/31/2012 0857   CHOLHDL 3.3 02/08/2021 1115   LDLCALC 56 02/08/2021 1115   LDLCALC 52 01/19/2014 1007   LDLCALC 75 12/31/2012 0857   LDLDIRECT 39 11/02/2016 0806      Wt Readings from Last 3 Encounters:  03/29/21 205 lb (93 kg)  02/08/21 206 lb 9.6 oz (93.7 kg)  12/30/20 207 lb (93.9 kg)      Other studies Reviewed: Additional studies/ records that were reviewed today include: None. Review of the above records demonstrates:  Please see elsewhere in the note.     ASSESSMENT AND PLAN:  CAD-  The patient has no new sypmtoms.  No further cardiovascular testing is indicated.  We will continue with  aggressive risk reduction and meds as listed.  HYPERLIPIDEMIA -  LDL was 56 with an HDL of 40.  No change in therapy.  HYPERTENSION -  The blood pressure is controlled.  No change in therapy.   DM -  A1C was up to 7.9 which is above his previous.  He has an endocrinologist and is on multiple meds as above.  We talked at length about diet and exercise as there is certainly room to move there.    Current medicines are reviewed at length with the patient today.  The patient does not have concerns regarding medicines.  The following changes have been made:  No change   Labs/ tests ordered today include: None  No orders of the defined types were placed in this encounter.    Disposition:   FU with me in 12 months.     Signed, Marvin Breeding, MD  03/29/2021 1:56 PM    [facility]

## 2021-03-29 ENCOUNTER — Encounter: Payer: Self-pay | Admitting: Cardiology

## 2021-03-29 ENCOUNTER — Ambulatory Visit (INDEPENDENT_AMBULATORY_CARE_PROVIDER_SITE_OTHER): Payer: Medicare Other | Admitting: Cardiology

## 2021-03-29 ENCOUNTER — Other Ambulatory Visit: Payer: Self-pay

## 2021-03-29 VITALS — BP 140/78 | Ht 68.0 in | Wt 205.0 lb

## 2021-03-29 DIAGNOSIS — Z9861 Coronary angioplasty status: Secondary | ICD-10-CM | POA: Diagnosis not present

## 2021-03-29 DIAGNOSIS — E1159 Type 2 diabetes mellitus with other circulatory complications: Secondary | ICD-10-CM | POA: Diagnosis not present

## 2021-03-29 DIAGNOSIS — E785 Hyperlipidemia, unspecified: Secondary | ICD-10-CM | POA: Diagnosis not present

## 2021-03-29 DIAGNOSIS — I10 Essential (primary) hypertension: Secondary | ICD-10-CM

## 2021-03-29 DIAGNOSIS — I2510 Atherosclerotic heart disease of native coronary artery without angina pectoris: Secondary | ICD-10-CM

## 2021-03-29 NOTE — Patient Instructions (Signed)
Medication Instructions:  Your Physician recommend you continue on your current medication as directed.    *If you need a refill on your cardiac medications before your next appointment, please call your pharmacy*   Lab Work: None ordered today   Testing/Procedures: None ordered today   Follow-Up: At [facility], you and your health needs are our priority.  As part of our continuing mission to provide you with exceptional heart care, we have created designated Provider Care Teams.  These Care Teams include your primary Cardiologist (physician) and Advanced Practice Providers (APPs -  Physician Assistants and Nurse Practitioners) who all work together to provide you with the care you need, when you need it.  We recommend signing up for the patient portal called "MyChart".  Sign up information is provided on this After Visit Summary.  MyChart is used to connect with patients for Virtual Visits (Telemedicine).  Patients are able to view lab/test results, encounter notes, upcoming appointments, etc.  Non-urgent messages can be sent to your provider as well.   To learn more about what you can do with MyChart, go to [email or web address].    Your next appointment:   1 year(s)  The format for your next appointment:   In Person  Provider:   Damion Hochrein, MD {        

## 2021-03-31 DIAGNOSIS — Z961 Presence of intraocular lens: Secondary | ICD-10-CM | POA: Diagnosis not present

## 2021-03-31 DIAGNOSIS — H04123 Dry eye syndrome of bilateral lacrimal glands: Secondary | ICD-10-CM | POA: Diagnosis not present

## 2021-03-31 DIAGNOSIS — Z794 Long term (current) use of insulin: Secondary | ICD-10-CM | POA: Diagnosis not present

## 2021-03-31 DIAGNOSIS — E119 Type 2 diabetes mellitus without complications: Secondary | ICD-10-CM | POA: Diagnosis not present

## 2021-03-31 LAB — HM DIABETES EYE EXAM

## 2021-05-12 ENCOUNTER — Ambulatory Visit (INDEPENDENT_AMBULATORY_CARE_PROVIDER_SITE_OTHER): Payer: Medicare Other | Admitting: Family Medicine

## 2021-05-12 ENCOUNTER — Other Ambulatory Visit: Payer: Self-pay

## 2021-05-12 ENCOUNTER — Encounter: Payer: Self-pay | Admitting: Family Medicine

## 2021-05-12 VITALS — BP 134/77 | HR 89 | Temp 97.0°F | Resp 20 | Ht 68.0 in | Wt 205.0 lb

## 2021-05-12 DIAGNOSIS — Z23 Encounter for immunization: Secondary | ICD-10-CM | POA: Diagnosis not present

## 2021-05-12 DIAGNOSIS — I2510 Atherosclerotic heart disease of native coronary artery without angina pectoris: Secondary | ICD-10-CM

## 2021-05-12 DIAGNOSIS — Z9861 Coronary angioplasty status: Secondary | ICD-10-CM

## 2021-05-12 DIAGNOSIS — E1159 Type 2 diabetes mellitus with other circulatory complications: Secondary | ICD-10-CM

## 2021-05-12 DIAGNOSIS — I10 Essential (primary) hypertension: Secondary | ICD-10-CM

## 2021-05-12 DIAGNOSIS — E782 Mixed hyperlipidemia: Secondary | ICD-10-CM | POA: Diagnosis not present

## 2021-05-12 LAB — LIPID PANEL
Chol/HDL Ratio: 3.4 ratio (ref 0.0–5.0)
Cholesterol, Total: 139 mg/dL (ref 100–199)
HDL: 41 mg/dL (ref >39)
LDL Chol Calc (NIH): 70 mg/dL (ref 0–99)
Triglycerides: 167 mg/dL — ABNORMAL HIGH (ref 0–149)
VLDL Cholesterol Cal: 28 mg/dL (ref 5–40)

## 2021-05-12 LAB — BAYER DCA HB A1C WAIVED: HB A1C (BAYER DCA - WAIVED): 8.2 % — ABNORMAL HIGH (ref 4.8–5.6)

## 2021-05-12 LAB — CMP14+EGFR
ALT: 15 IU/L (ref 0–44)
AST: 15 IU/L (ref 0–40)
Albumin/Globulin Ratio: 1.9 (ref 1.2–2.2)
Albumin: 4.3 g/dL (ref 3.7–4.7)
Alkaline Phosphatase: 60 IU/L (ref 44–121)
BUN/Creatinine Ratio: 13 (ref 10–24)
BUN: 12 mg/dL (ref 8–27)
Bilirubin Total: 0.4 mg/dL (ref 0.0–1.2)
CO2: 22 mmol/L (ref 20–29)
Calcium: 9.3 mg/dL (ref 8.6–10.2)
Chloride: 98 mmol/L (ref 96–106)
Creatinine, Ser: 0.96 mg/dL (ref 0.76–1.27)
Globulin, Total: 2.3 g/dL (ref 1.5–4.5)
Glucose: 206 mg/dL — ABNORMAL HIGH (ref 70–99)
Potassium: 4.3 mmol/L (ref 3.5–5.2)
Sodium: 135 mmol/L (ref 134–144)
Total Protein: 6.6 g/dL (ref 6.0–8.5)
eGFR: 81 mL/min/{1.73_m2} (ref >59)

## 2021-05-12 LAB — CBC WITH DIFFERENTIAL/PLATELET
Basophils Absolute: 0.1 10*3/uL (ref 0.0–0.2)
Basos: 2 %
EOS (ABSOLUTE): 0.4 10*3/uL (ref 0.0–0.4)
Eos: 5 %
Hematocrit: 48.8 % (ref 37.5–51.0)
Hemoglobin: 15.9 g/dL (ref 13.0–17.7)
Immature Grans (Abs): 0.1 10*3/uL (ref 0.0–0.1)
Immature Granulocytes: 1 %
Lymphocytes Absolute: 1.4 10*3/uL (ref 0.7–3.1)
Lymphs: 16 %
MCH: 28.9 pg (ref 26.6–33.0)
MCHC: 32.6 g/dL (ref 31.5–35.7)
MCV: 89 fL (ref 79–97)
Monocytes Absolute: 0.7 10*3/uL (ref 0.1–0.9)
Monocytes: 8 %
Neutrophils Absolute: 6.1 10*3/uL (ref 1.4–7.0)
Neutrophils: 68 %
Platelets: 274 10*3/uL (ref 150–450)
RBC: 5.5 x10E6/uL (ref 4.14–5.80)
RDW: 12.7 % (ref 11.6–15.4)
WBC: 8.7 10*3/uL (ref 3.4–10.8)

## 2021-05-12 MED ORDER — OLMESARTAN MEDOXOMIL-HCTZ 40-25 MG PO TABS: 1.0000 | ORAL_TABLET | Freq: Every day | ORAL | 1 refills | Status: DC

## 2021-05-12 MED ORDER — INSULIN PEN NEEDLE 32G X 5 MM MISC: 4 refills | Status: DC

## 2021-05-12 MED ORDER — METOPROLOL SUCCINATE ER 100 MG PO TB24: ORAL_TABLET | ORAL | 1 refills | Status: DC

## 2021-05-12 MED ORDER — ROSUVASTATIN CALCIUM 40 MG PO TABS: 40.0000 mg | ORAL_TABLET | ORAL | 1 refills | Status: DC

## 2021-05-12 MED ORDER — METFORMIN HCL ER 750 MG PO TB24: 750.0000 mg | ORAL_TABLET | Freq: Every day | ORAL | 3 refills | Status: DC

## 2021-05-12 NOTE — Progress Notes (Signed)
Subjective:  Patient ID: Marvin Frost,  male    DOB: [birth year]  Age: 75 y.o.    CC: Medical Management of Chronic Issues   HPI Hiroki Wint presents for  follow-up of hypertension. Patient has no history of headache chest pain or shortness of breath or recent cough. Patient also denies symptoms of TIA such as numbness weakness lateralizing. Patient denies side effects from medication. States taking it regularly.  Patient also  in for follow-up of elevated cholesterol. Doing well without complaints on current medication. Denies side effects  including myalgia and arthralgia and nausea. Also in today for liver function testing. Currently no chest pain, shortness of breath or other cardiovascular related symptoms noted.  Follow-up of diabetes. Patient does check blood sugar at home. Readings run between 110-180 fasting with most in the 120-150 range. A few over200 and one as low as 64. and 140-250 prandial. Patient denies symptoms such as excessive hunger or urinary frequency, excessive hunger, nausea No significant hypoglycemic spells noted. Medications reviewed. Pt reports taking them regularly. Pt. denies complication/adverse reaction today.    History Braven has a past medical history of Allergy, Anemia, Arthritis, CAD (coronary artery disease), Cancer ([place]), Cataract, Diabetes mellitus (1995), Dysrhythmia, Gastric polyps (09/18/2017), GERD (gastroesophageal reflux disease), History of frequent urinary tract infections, History of hiatal hernia, Hyperlipemia, Hypertension, Obesity, and Pancreatitis.   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   His family history includes Diabetes in his brother; GI problems in his sister; Healthy in his daughter; Heart attack in his mother; Heart attack (age of onset: 107) in his father; Heart attack (age of onset: 55) in his brother; Heart disease in his brother and mother.He reports that he quit smoking about 37 years ago. His smoking use included cigarettes. He has a 10.00 pack-year smoking history. He quit smokeless tobacco use about 42 years ago.  His smokeless tobacco use included chew. He reports that he does not drink alcohol and does not use drugs.  Current Outpatient Medications on File Prior to Visit  Medication Sig Dispense Refill   amoxicillin (AMOXIL) 500 MG capsule Take 4 capsules (2,000 mg total) by mouth See admin instructions. Take four capsules (2000 mg) by mouth one hour prior to dental appointments 4 capsule 10   aspirin EC 81 MG EC tablet Take 1 tablet (81 mg total) by mouth daily.     Blood Glucose Monitoring Suppl (FREESTYLE LITE) DEVI Use to check BS bid. DX. E11.9 1 each 0   cetirizine (ZYRTEC) 10 MG tablet Take 10 mg by mouth as needed for allergies or rhinitis.      Continuous Blood Gluc Sensor (FREESTYLE LIBRE 14 DAY SENSOR) MISC Inject 1 each into the skin every 14 (fourteen) days. Use as directed. 6 each 2   Dulaglutide (TRULICITY) 4.5 JA/2.5KN SOPN Inject 4 mg into the skin once a week. 6 mL 3   Evolocumab (REPATHA SURECLICK) 397 MG/ML SOAJ Inject 140 mg into the skin every 14 (fourteen) days. 6 mL 4   ferrous sulfate 325 (65 FE) MG tablet Take 1 tablet (325 mg total) by mouth every other  day. (Patient taking differently: Take 325 mg by mouth every morning.) 90 tablet 1   insulin degludec (TRESIBA FLEXTOUCH) 200 UNIT/ML FlexTouch Pen Inject 72 Units into the skin at bedtime. 30 mL 5   Insulin Pen Needle 32G X 5 MM MISC by  Does not apply route. Use with insulin pen needles up to 5 times daily  E11.9     Lancets (FREESTYLE) lancets Check BS BID and PRN. DX.E11.9 300 each 3   nitroGLYCERIN (NITROSTAT) 0.4 MG SL tablet DISSOLVE 1 TABLET UNDER THE TONGUE EVERY 5 MINUTES AS NEEDED FOR CHEST PAIN 25 tablet 11   pantoprazole (PROTONIX) 40 MG tablet Take 1 tablet (40 mg total) by mouth daily. 90 tablet 3   terbinafine (LAMISIL) 1 % cream Apply 1 application topically 2 (two) times daily. (Patient taking differently: Apply 1 application topically 2 (two) times daily. As needed) 30 g 0   No current facility-administered medications on file prior to visit.    ROS Review of Systems  Constitutional:  Negative for fever.  Respiratory:  Negative for shortness of breath.   Cardiovascular:  Negative for chest pain.  Musculoskeletal:  Negative for arthralgias.  Skin:  Negative for rash.   Objective:  BP 134/77   Pulse 89   Temp (!) 97 F (36.1 C) (Temporal)   Resp 20   Ht 5' 8"  (1.727 m)   Wt 205 lb (93 kg)   SpO2 98%   BMI 31.17 kg/m   BP Readings from Last 3 Encounters:  05/12/21 134/77  03/29/21 140/78  02/08/21 130/73    Wt Readings from Last 3 Encounters:  05/12/21 205 lb (93 kg)  03/29/21 205 lb (93 kg)  02/08/21 206 lb 9.6 oz (93.7 kg)     Physical Exam Vitals reviewed.  Constitutional:      Appearance: He is well-developed.  HENT:     Head: Normocephalic and atraumatic.     Right Ear: External ear normal.     Left Ear: External ear normal.     Mouth/Throat:     Pharynx: No oropharyngeal exudate or posterior oropharyngeal erythema.  Eyes:     Pupils: Pupils are equal, round, and reactive to light.  Cardiovascular:     Rate and Rhythm: Normal rate and regular rhythm.     Heart sounds: No murmur heard. Pulmonary:     Effort: No respiratory distress.     Breath sounds: Normal breath sounds.  Musculoskeletal:     Cervical back: Normal range of motion and neck supple.  Neurological:      Mental Status: He is alert and oriented to person, place, and time.    Diabetic Foot Exam - Simple   No data filed       Assessment & Plan:   Becker was seen today for medical management of chronic issues.  Diagnoses and all orders for this visit:  Essential hypertension, benign -     Bayer DCA Hb A1c Waived -     CBC with Differential/Platelet -     CMP14+EGFR -     Lipid panel -     metoprolol succinate (TOPROL-XL) 100 MG 24 hr tablet; TAKE 1 TABLET DAILY FOR BLOOD PRESSURE CONTROL  Mixed hyperlipidemia -     Bayer DCA Hb A1c Waived -     CBC with Differential/Platelet -     CMP14+EGFR -     Lipid panel -     rosuvastatin (CRESTOR) 40 MG tablet; Take 1 tablet (40 mg total)  by mouth every 3 (three) days.  DM type 2 causing vascular disease (HCC) -     Bayer DCA Hb A1c Waived -     CBC with Differential/Platelet -     CMP14+EGFR -     Lipid panel  Need for immunization against influenza -     Flu Vaccine QUAD High Dose(Fluad)  Other orders -     olmesartan-hydrochlorothiazide (BENICAR HCT) 40-25 MG tablet; Take 1 tablet by mouth daily. -     metFORMIN (GLUCOPHAGE-XR) 750 MG 24 hr tablet; Take 1 tablet (750 mg total) by mouth daily with breakfast. -     Insulin Pen Needle (NOVOTWIST) 32G X 5 MM MISC; USE WITH INSULIN PENS UP TO 5 TIMES DAILY Dx E11.59  I have discontinued Jeneen Rinks Lindholm's gabapentin and predniSONE. I have also changed his metFORMIN. Additionally, I am having him maintain his FreeStyle Lite, cetirizine, freestyle, aspirin, ferrous sulfate, FreeStyle Libre 14 Day Sensor, terbinafine, pantoprazole, Tresiba FlexTouch, nitroGLYCERIN, Repatha SureClick, Trulicity, amoxicillin, Insulin Pen Needle, rosuvastatin, olmesartan-hydrochlorothiazide, metoprolol succinate, and Insulin Pen Needle.  Meds ordered this encounter  Medications   rosuvastatin (CRESTOR) 40 MG tablet    Sig: Take 1 tablet (40 mg total) by mouth every 3 (three) days.    Dispense:  90 tablet     Refill:  1   olmesartan-hydrochlorothiazide (BENICAR HCT) 40-25 MG tablet    Sig: Take 1 tablet by mouth daily.    Dispense:  90 tablet    Refill:  1   metoprolol succinate (TOPROL-XL) 100 MG 24 hr tablet    Sig: TAKE 1 TABLET DAILY FOR BLOOD PRESSURE CONTROL    Dispense:  90 tablet    Refill:  1   metFORMIN (GLUCOPHAGE-XR) 750 MG 24 hr tablet    Sig: Take 1 tablet (750 mg total) by mouth daily with breakfast.    Dispense:  180 tablet    Refill:  3   Insulin Pen Needle (NOVOTWIST) 32G X 5 MM MISC    Sig: USE WITH INSULIN PENS UP TO 5 TIMES DAILY Dx E11.59    Dispense:  500 each    Refill:  4     Follow-up: Return in about 3 months (around 08/12/2021).  [name], M.D.

## 2021-05-16 NOTE — Progress Notes (Signed)
Hello [name],  Your lab result is normal and/or stable.Some minor variations that are not significant are commonly marked abnormal, but do not represent any medical problem for you.  Best regards, Suetta Hoffmeister, M.D.

## 2021-07-06 ENCOUNTER — Encounter: Payer: Self-pay | Admitting: Nurse Practitioner

## 2021-07-06 ENCOUNTER — Other Ambulatory Visit: Payer: Self-pay

## 2021-07-06 ENCOUNTER — Ambulatory Visit (INDEPENDENT_AMBULATORY_CARE_PROVIDER_SITE_OTHER): Payer: Medicare Other | Admitting: Nurse Practitioner

## 2021-07-06 VITALS — BP 112/73 | HR 85 | Ht 68.0 in | Wt 207.0 lb

## 2021-07-06 DIAGNOSIS — E1159 Type 2 diabetes mellitus with other circulatory complications: Secondary | ICD-10-CM

## 2021-07-06 DIAGNOSIS — E782 Mixed hyperlipidemia: Secondary | ICD-10-CM

## 2021-07-06 DIAGNOSIS — I10 Essential (primary) hypertension: Secondary | ICD-10-CM | POA: Diagnosis not present

## 2021-07-06 NOTE — Progress Notes (Signed)
07/06/2021, 4:02 PM     Endocrinology follow-up note   Subjective:    Patient ID: Marvin Frost, male    DOB: [birth year].   Marvin Frost is being seen in follow-up after he was seen in consultation for management of currently uncontrolled symptomatic diabetes. -He also has dyslipidemia, hypertension. PMD:   [name], MD.   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    Cataract    both removed   Diabetes mellitus 1995   Dysrhythmia    history of heart palpitations    Gastric polyps 09/18/2017   GERD (gastroesophageal reflux disease)    History of frequent urinary tract infections    History of hiatal hernia    Hyperlipemia    Hypertension    Obesity    Pancreatitis     Past Surgical History:  Procedure Laterality Date   CARDIAC CATHETERIZATION     2004 and 1999- done by Dr Percival Spanish    COLONOSCOPY  11/2003   diverticulosis, no polyps   CORONARY STENT INTERVENTION N/A 05/28/2018   Procedure: CORONARY STENT INTERVENTION;  Surgeon: Sherren Mocha, MD;  Location: [facility];  Service: Cardiovascular;  Laterality: N/A;   EYE SURGERY Bilateral    cateracts - dr Katy Fitch   HYDROCELE EXCISION / REPAIR     INTRAVASCULAR PRESSURE WIRE/FFR STUDY N/A 05/28/2018   Procedure: INTRAVASCULAR PRESSURE WIRE/FFR STUDY;  Surgeon: Sherren Mocha, MD;  Location: [facility];  Service: Cardiovascular;  Laterality: N/A;   left calf surgery      related to trauma of being torn    LEFT HEART CATH AND CORONARY ANGIOGRAPHY N/A 05/28/2018   Procedure: LEFT HEART CATH AND CORONARY ANGIOGRAPHY;  Surgeon: Sherren Mocha, MD;  Location: [facility];  Service: Cardiovascular;  Laterality: N/A;   left index finger arthrioscopic surgery      lymph node  removed from right neck      right shoulder surgery     TOTAL HIP ARTHROPLASTY Right 03/16/2015   Procedure: RIGHT TOTAL HIP ARTHROPLASTY ANTERIOR APPROACH;  Surgeon: Paralee Cancel, MD;  Location: [facility];  Service: Orthopedics;  Laterality: Right;   TOTAL HIP ARTHROPLASTY Left 06/22/2015   Procedure: LEFT TOTAL HIP ARTHROPLASTY ANTERIOR APPROACH;  Surgeon: Paralee Cancel, MD;  Location: [facility];  Service: Orthopedics;  Laterality: Left;   UPPER GASTROINTESTINAL ENDOSCOPY     hiatal hernia 2cm, mild gastritis - 2012, multiple hyperplastic and fundic gland gastric polyps 2019    Social History   Socioeconomic History   Marital status: Married    Spouse name: Marvin Frost   Number of children: 1   Years of education: Some College   Highest education level: Associate degree: occupational, Hotel manager, or vocational program  Occupational History   Occupation: Best boy: [employer]    Comment: retired  Tobacco Use   Smoking status: Former  Packs/day: 1.00    Years: 10.00    Pack years: 10.00    Types: Cigarettes    Quit date: 04/06/1984    Years since quitting: 37.2   Smokeless tobacco: Former    Types: Chew    Quit date: 06/26/1978  Vaping Use   Vaping Use: Never used  Substance and Sexual Activity   Alcohol use: No   Drug use: No   Sexual activity: Yes  Other Topics Concern   Not on file  Social History Narrative   Married   Retired from [employer]   1 child   Former but no current tobacco   No EtOH/drugs   Social Determinants of Radio broadcast assistant Strain: Not on file  Food Insecurity: Not on file  Transportation Needs: Not on file  Physical Activity: Not on file  Stress: Not on file  Social Connections: Not on file    Family History  Problem Relation Age of Onset   Heart disease Mother    Heart attack Mother    Diabetes Brother    Heart disease Brother    Heart attack Brother 64   Heart attack Father 50   GI problems Sister        torn  esophagus during endoscopy   Healthy Daughter    Colon polyps Neg Hx    Esophageal cancer Neg Hx    Rectal cancer Neg Hx    Stomach cancer Neg Hx    Colon cancer Neg Hx     Outpatient Encounter Medications as of 07/06/2021  Medication Sig   amoxicillin (AMOXIL) 500 MG capsule Take 4 capsules (2,000 mg total) by mouth See admin instructions. Take four capsules (2000 mg) by mouth one hour prior to dental appointments   aspirin EC 81 MG EC tablet Take 1 tablet (81 mg total) by mouth daily.   Blood Glucose Monitoring Suppl (FREESTYLE LITE) DEVI Use to check BS bid. DX. E11.9   cetirizine (ZYRTEC) 10 MG tablet Take 10 mg by mouth as needed for allergies or rhinitis.    Continuous Blood Gluc Sensor (FREESTYLE LIBRE 14 DAY SENSOR) MISC Inject 1 each into the skin every 14 (fourteen) days. Use as directed.   Dulaglutide (TRULICITY) 4.5 FT/7.3UK SOPN Inject 4 mg into the skin once a week.   Evolocumab (REPATHA SURECLICK) 025 MG/ML SOAJ Inject 140 mg into the skin every 14 (fourteen) days.   ferrous sulfate 325 (65 FE) MG tablet Take 1 tablet (325 mg total) by mouth every other day. (Patient taking differently: Take 325 mg by mouth every morning.)   insulin degludec (TRESIBA FLEXTOUCH) 200 UNIT/ML FlexTouch Pen Inject 72 Units into the skin at bedtime.   Insulin Pen Needle (NOVOTWIST) 32G X 5 MM MISC USE WITH INSULIN PENS UP TO 5 TIMES DAILY Dx E11.59   Insulin Pen Needle 32G X 5 MM MISC by Does not apply route. Use with insulin pen needles up to 5 times daily  E11.9   Lancets (FREESTYLE) lancets Check BS BID and PRN. DX.E11.9   metFORMIN (GLUCOPHAGE-XR) 750 MG 24 hr tablet Take 1 tablet (750 mg total) by mouth daily with breakfast.   metoprolol succinate (TOPROL-XL) 100 MG 24 hr tablet TAKE 1 TABLET DAILY FOR BLOOD PRESSURE CONTROL   nitroGLYCERIN (NITROSTAT) 0.4 MG SL tablet DISSOLVE 1 TABLET UNDER THE TONGUE EVERY 5 MINUTES AS NEEDED FOR CHEST PAIN   olmesartan-hydrochlorothiazide (BENICAR HCT)  40-25 MG tablet Take 1 tablet by mouth daily.   pantoprazole (PROTONIX)  40 MG tablet Take 1 tablet (40 mg total) by mouth daily.   rosuvastatin (CRESTOR) 40 MG tablet Take 1 tablet (40 mg total) by mouth every 3 (three) days.   SURE COMFORT PEN NEEDLES 32G X 6 MM MISC    terbinafine (LAMISIL) 1 % cream Apply 1 application topically 2 (two) times daily. (Patient taking differently: Apply 1 application topically 2 (two) times daily. As needed)   No facility-administered encounter medications on file as of 07/06/2021.    ALLERGIES: Allergies  Allergen Reactions   Brilinta [Ticagrelor]     Rash and SOB    Liraglutide Other (See Comments)    Abdominal pain and enlarged pancreas on CT ? pancreatitis   Baxinets [Cetylpyridinium-Benzocaine] Other (See Comments)    Unknown reaction   Celebrex [Celecoxib] Nausea Only   Toprol Xl [Metoprolol Succinate] Other (See Comments)    Unknown reaction - pt states it did not work   Triplex Ad Other (See Comments)    Abdominal pain and enlarged pancreas on CT? pancreatitis   Welchol [Colesevelam Hcl] Other (See Comments)    constipation   Norvasc [Amlodipine Besylate] Rash   Statins Other (See Comments)    muscle and joint pain if taken for long periods of time, takes Crestor every 2-3 days to avoid this reaction    VACCINATION STATUS: Immunization History  Administered Date(s) Administered   Fluad Quad(high Dose 65+) 03/26/2019, 04/19/2020, 05/12/2021   Influenza Whole 02/24/2010   Influenza, High Dose Seasonal PF 04/15/2015, 04/12/2016, 04/05/2017, 04/16/2018   Influenza,inj,Quad PF,6+ Mos 03/26/2013, 03/30/2014   Moderna Sars-Covid-2 Vaccination 07/31/2019, 08/28/2019, 03/15/2020   Pneumococcal Conjugate-13 06/17/2013   Pneumococcal Polysaccharide-23 02/24/2009   Td 02/24/2006   Tdap 04/27/2011   Zoster Recombinat (Shingrix) 07/22/2018   Zoster, Live 07/27/2006    Diabetes He presents for his follow-up diabetic visit. He has type 2  diabetes mellitus. Onset time: He was diagnosed at approximate age of 37 years. His disease course has been fluctuating. There are no hypoglycemic associated symptoms. Pertinent negatives for hypoglycemia include no confusion, headaches, pallor or seizures. Pertinent negatives for diabetes include no chest pain, no fatigue, no polydipsia, no polyphagia, no polyuria and no weakness. There are no hypoglycemic complications. Symptoms are stable. Diabetic complications include heart disease and nephropathy. Risk factors for coronary artery disease include diabetes mellitus, dyslipidemia, family history, male sex, hypertension, sedentary lifestyle, tobacco exposure and obesity. Current diabetic treatment includes oral agent (monotherapy) and insulin injections (Trulicity). He is compliant with treatment most of the time. His weight is fluctuating minimally. He is following a generally healthy diet. When asked about meal planning, he reported none. He has not had a previous visit with a dietitian. He participates in exercise intermittently. His home blood glucose trend is decreasing steadily. His breakfast blood glucose range is generally 110-130 mg/dl. His lunch blood glucose range is generally 180-200 mg/dl. His dinner blood glucose range is generally >200 mg/dl. His bedtime blood glucose range is generally 180-200 mg/dl. (He presents today with his logs showing at goal fasting and above target postprandial glycemic profile.  His most recent A1c, done at his PCP office on 11/17 was 8.2%, increasing from previous visit of 7.9%.  He was shocked by this result as he had been doing more physical exercise prior to that visit.  He does have rare mild hypoglycemia noted, likely due to meal timing or meal quantity variations.  His PCP also changed his Metformin to 750 mg ER daily with breakfast.) An ACE inhibitor/angiotensin II  receptor blocker is being taken. He does not see a podiatrist.Eye exam is current.   Hyperlipidemia This is a chronic problem. The current episode started more than 1 year ago. The problem is controlled. Recent lipid tests were reviewed and are normal. Exacerbating diseases include chronic renal disease, diabetes and obesity. Factors aggravating his hyperlipidemia include beta blockers. Pertinent negatives include no chest pain, myalgias or shortness of breath. Current antihyperlipidemic treatment includes statins (Repatha every 2 weeks. Only takes his statin once every few days dt myalgias). The current treatment provides moderate improvement of lipids. Compliance problems include medication side effects.  Risk factors for coronary artery disease include male sex, obesity, family history, dyslipidemia, diabetes mellitus, hypertension and a sedentary lifestyle.  Hypertension This is a chronic problem. The current episode started more than 1 year ago. The problem has been gradually improving since onset. The problem is controlled. Pertinent negatives include no chest pain, headaches, neck pain, palpitations or shortness of breath. There are no associated agents to hypertension. Risk factors for coronary artery disease include dyslipidemia, diabetes mellitus, family history, male gender, obesity, sedentary lifestyle and smoking/tobacco exposure. Past treatments include angiotensin blockers, diuretics and beta blockers. The current treatment provides moderate improvement. There are no compliance problems.  Hypertensive end-organ damage includes CAD/MI. Identifiable causes of hypertension include chronic renal disease.   Review of systems  Constitutional: + Minimally fluctuating body weight,  current Body mass index is 31.47 kg/m. , no fatigue, no subjective hyperthermia, no subjective hypothermia Eyes: no blurry vision, no xerophthalmia ENT: no sore throat, no nodules palpated in throat, no dysphagia/odynophagia, no hoarseness Cardiovascular: no chest pain, no shortness of breath, no  palpitations, no leg swelling Respiratory: no cough, no shortness of breath Gastrointestinal: no nausea/vomiting/diarrhea Musculoskeletal: no muscle/joint aches Skin: no rashes, no hyperemia Neurological: no tremors, no numbness, no tingling, no dizziness Psychiatric: no depression, no anxiety   Objective:    BP 112/73    Pulse 85    Ht 5\' 8"  (1.727 m)    Wt 207 lb (93.9 kg)    SpO2 95%    BMI 31.47 kg/m   Wt Readings from Last 3 Encounters:  07/06/21 207 lb (93.9 kg)  05/12/21 205 lb (93 kg)  03/29/21 205 lb (93 kg)    BP Readings from Last 3 Encounters:  07/06/21 112/73  05/12/21 134/77  03/29/21 140/78    Physical Exam- Limited  Constitutional:  Body mass index is 31.47 kg/m. , not in acute distress, normal state of mind Eyes:  EOMI, no exophthalmos Neck: Supple Cardiovascular: RRR, no murmurs, rubs, or gallops, no edema Respiratory: Adequate breathing efforts, no crackles, rales, rhonchi, or wheezing Musculoskeletal: no gross deformities, strength intact in all four extremities, no gross restriction of joint movements Skin:  no rashes, no hyperemia Neurological: no tremor with outstretched hands   Diabetic Foot Exam - Simple   Simple Foot Form Diabetic Foot exam was performed with the following findings: Yes 07/06/2021  3:53 PM  Visual Inspection No deformities, no ulcerations, no other skin breakdown bilaterally: Yes Sensation Testing Intact to touch and monofilament testing bilaterally: Yes Pulse Check Posterior Tibialis and Dorsalis pulse intact bilaterally: Yes Comments    CMP     Component Value Date/Time   NA 135 05/12/2021 1040   K 4.3 05/12/2021 1040   CL 98 05/12/2021 1040   CO2 22 05/12/2021 1040   GLUCOSE 206 (H) 05/12/2021 1040   GLUCOSE 154 (H) 05/29/2018 0217   BUN 12 05/12/2021 1040  CREATININE 0.96 05/12/2021 1040   CREATININE 0.95 12/31/2012 0857   CALCIUM 9.3 05/12/2021 1040   PROT 6.6 05/12/2021 1040   ALBUMIN 4.3 05/12/2021 1040    AST 15 05/12/2021 1040   ALT 15 05/12/2021 1040   ALKPHOS 60 05/12/2021 1040   BILITOT 0.4 05/12/2021 1040   GFRNONAA 68 07/07/2020 1028   GFRNONAA 81 12/31/2012 0857   GFRAA 78 07/07/2020 1028   GFRAA >89 12/31/2012 0857    Diabetic Labs (most recent): Lab Results  Component Value Date   HGBA1C 8.2 (H) 05/12/2021   HGBA1C 7.9 (H) 02/08/2021   HGBA1C 8.6 (H) 11/08/2020     Lipid Panel ( most recent) Lipid Panel     Component Value Date/Time   CHOL 139 05/12/2021 1040   CHOL 157 12/31/2012 0857   TRIG 167 (H) 05/12/2021 1040   TRIG 271 (H) 02/17/2016 0824   TRIG 184 (H) 12/31/2012 0857   HDL 41 05/12/2021 1040   HDL 44 02/17/2016 0824   HDL 45 12/31/2012 0857   CHOLHDL 3.4 05/12/2021 1040   LDLCALC 70 05/12/2021 1040   LDLCALC 52 01/19/2014 1007   LDLCALC 75 12/31/2012 0857   LDLDIRECT 39 11/02/2016 0806      Lab Results  Component Value Date   TSH 3.350 07/07/2020   TSH 2.720 12/30/2019   TSH 2.290 09/03/2014   FREET4 1.42 07/07/2020     Assessment & Plan:   1) DM type 2 causing vascular disease ([place])  - Calob Baskette has currently uncontrolled symptomatic type 2 DM since  76 years of age.  He presents today with his logs showing at goal fasting and above target postprandial glycemic profile.  His most recent A1c, done at his PCP office on 11/17 was 8.2%, increasing from previous visit of 7.9%.  He was shocked by this result as he had been doing more physical exercise prior to that visit.  He does have rare mild hypoglycemia noted, likely due to meal timing or meal quantity variations.  His PCP also changed his Metformin to 750 mg ER daily with breakfast.  -I have reviewed his recent labs with him.  - I had a long discussion with him about the progressive nature of diabetes and the pathology behind its complications. -his diabetes is complicated by coronary artery disease, obesity and he remains at a high risk for more acute and chronic complications which  include CAD, CVA, CKD, retinopathy, and neuropathy. These are all discussed in detail with him.  - Nutritional counseling repeated at each appointment due to patients tendency to fall back in to old habits.  - The patient admits there is a room for improvement in their diet and drink choices. -  Suggestion is made for the patient to avoid simple carbohydrates from their diet including Cakes, Sweet Desserts / Pastries, Ice Cream, Soda (diet and regular), Sweet Tea, Candies, Chips, Cookies, Sweet Pastries, Store Bought Juices, Alcohol in Excess of 1-2 drinks a day, Artificial Sweeteners, Coffee Creamer, and "Sugar-free" Products. This will help patient to have stable blood glucose profile and potentially avoid unintended weight gain.   - I encouraged the patient to switch to unprocessed or minimally processed complex starch and increased protein intake (animal or plant source), fruits, and vegetables.   - Patient is advised to stick to a routine mealtimes to eat 3 meals a day and avoid unnecessary snacks (to snack only to correct hypoglycemia).  - he is scheduled with Jearld Fenton, RDN, CDE for individualized diabetes  education.  - I have approached him with the following individualized plan to manage diabetes and patient agrees:   -Given his improving glycemic profile over the last several weeks and his commitment to more physical exercise, no changes will be made to his medications today.  He is advised to continue Tresiba 72 units SQ daily at bedtime, continue Trulicity 4.5 mg SQ weekly (may use his remaining 3 mg doses if he has trouble finding the 4.5 mg in stock in pharmacy- we also discussed changing to Ozempic 2 mg dose if it is more accessible in pharmacies), and continue Metformin 750 mg ER daily with breakfast.  -He is encouraged to continue monitoring blood glucose using his CGM at least 4 times per day, before meals and at bedtime and call the clinic if he gets readings less than 70 or  greater than 200 for 3 tests in a row.  - Patient specific target  A1c;  LDL, HDL, Triglycerides,  were discussed in detail.  2) Blood Pressure /Hypertension:   His blood pressure is controlled to target.  He is advised to continue Metoprolol 100 mg po daily and Benicar HCT 40-25 mg po daily  3) Lipids/Hyperlipidemia: His most recent lipid panel from 05/12/21 shows controlled LDL of 70 and elevated triglycerides of 167 (greatly improved). He is statin intolerant with myalgias.  He has tolerated taking his Crestor every 2-3 days.  He is advised to continue taking his Crestor 40 mg po every 3 days or so and continue his Repatha 140 mg SQ every 2 weeks.  Side effects and precautions discussed with him.    4)  Weight/Diet:  His Body mass index is 31.47 kg/m.--   He has lost some weight over the last few visits and will benefit from continued weight loss.  I discussed with him the fact that loss of 5 - 10% of his  current body weight will have the most impact on his diabetes management.  CDE Consult will be initiated . Exercise, and detailed carbohydrates information provided  -  detailed on discharge instructions.  5) Chronic Care/Health Maintenance: -he is on ACEI/ARB and Statin medications and is encouraged to initiate and continue to follow up with Ophthalmology, Dentist, Podiatrist at least yearly or according to recommendations, and advised to  stay away from smoking. I have recommended yearly flu vaccine and pneumonia vaccine at least every 5 years; moderate intensity exercise for up to 150 minutes weekly; and  sleep for at least 7 hours a day.  - he is advised to maintain close follow up with [name], MD for primary care needs, as well as his other providers for optimal and coordinated care.      I spent 44 minutes in the care of the patient today including review of labs from [facility], Lipids, Thyroid Function, Hematology (current and previous including abstractions from other  facilities); face-to-face time discussing  his blood glucose readings/logs, discussing hypoglycemia and hyperglycemia episodes and symptoms, medications doses, his options of short and long term treatment based on the latest standards of care / guidelines;  discussion about incorporating lifestyle medicine;  and documenting the encounter.    Please refer to Patient Instructions for Blood Glucose Monitoring and Insulin/Medications Dosing Guide"  in media tab for additional information. Please  also refer to " Patient Self Inventory" in the Media  tab for reviewed elements of pertinent patient history.  [name] participated in the discussions, expressed understanding, and voiced agreement with the above plans.  All questions were answered to his satisfaction. he is encouraged to contact clinic should he have any questions or concerns prior to his return visit.    Follow up plan: - Return in about 3 months (around 10/04/2021) for Diabetes F/U with A1c in office, No previsit labs, Bring meter and logs.    Rayetta Pigg, [facility] [address] [place], [place] [address] Phone: [phone] Fax: [phone]  07/06/2021, 4:02 PM

## 2021-07-06 NOTE — Patient Instructions (Signed)

## 2021-07-07 ENCOUNTER — Telehealth: Payer: Self-pay | Admitting: Family Medicine

## 2021-07-07 NOTE — Telephone Encounter (Signed)
No samples available, patient aware 

## 2021-07-28 ENCOUNTER — Ambulatory Visit (INDEPENDENT_AMBULATORY_CARE_PROVIDER_SITE_OTHER): Payer: Medicare Other

## 2021-07-28 VITALS — Wt 205.0 lb

## 2021-07-28 DIAGNOSIS — Z0000 Encounter for general adult medical examination without abnormal findings: Secondary | ICD-10-CM

## 2021-07-28 NOTE — Patient Instructions (Signed)
Marvin Frost , Thank you for taking time to come for your Medicare Wellness Visit. I appreciate your ongoing commitment to your health goals. Please review the following plan we discussed and let me know if I can assist you in the future.   Screening recommendations/referrals: Colonoscopy: Done 09/13/2017; FOBT done 07/11/2018 - no repeat required Recommended yearly ophthalmology/optometry visit for glaucoma screening and checkup Recommended yearly dental visit for hygiene and checkup  Vaccinations: Influenza vaccine: Done 05/12/2021 - Repeat annually  Pneumococcal vaccine: Done 02/24/2009 & 06/17/2013 Tdap vaccine: Done 04/27/2011 - Repeat in 10 years *due now Shingles vaccine: Zostavax done 07/27/2006; Shingrix done 07/22/2018 - due for second dose   Covid-19: Done 07/31/2019, 08/28/2019, & 03/15/2020  Advanced directives: in chart  Conditions/risks identified: Aim for 30 minutes of exercise or brisk walking each day, drink 6-8 glasses of water and eat lots of fruits and vegetables.   Next appointment: Follow up in one year for your annual wellness visit.   Preventive Care 80 Years and Older, Male  Preventive care refers to lifestyle choices and visits with your health care provider that can promote health and wellness. What does preventive care include? A yearly physical exam. This is also called an annual well check. Dental exams once or twice a year. Routine eye exams. Ask your health care provider how often you should have your eyes checked. Personal lifestyle choices, including: Daily care of your teeth and gums. Regular physical activity. Eating a healthy diet. Avoiding tobacco and drug use. Limiting alcohol use. Practicing safe sex. Taking low doses of aspirin every day. Taking vitamin and mineral supplements as recommended by your health care provider. What happens during an annual well check? The services and screenings done by your health care provider during your annual well check  will depend on your age, overall health, lifestyle risk factors, and family history of disease. Counseling  Your health care provider may ask you questions about your: Alcohol use. Tobacco use. Drug use. Emotional well-being. Home and relationship well-being. Sexual activity. Eating habits. History of falls. Memory and ability to understand (cognition). Work and work Statistician. Screening  You may have the following tests or measurements: Height, weight, and BMI. Blood pressure. Lipid and cholesterol levels. These may be checked every 5 years, or more frequently if you are over 68 years old. Skin check. Lung cancer screening. You may have this screening every year starting at age 58 if you have a 30-pack-year history of smoking and currently smoke or have quit within the past 15 years. Fecal occult blood test (FOBT) of the stool. You may have this test every year starting at age 14. Flexible sigmoidoscopy or colonoscopy. You may have a sigmoidoscopy every 5 years or a colonoscopy every 10 years starting at age 84. Prostate cancer screening. Recommendations will vary depending on your family history and other risks. Hepatitis C blood test. Hepatitis B blood test. Sexually transmitted disease (STD) testing. Diabetes screening. This is done by checking your blood sugar (glucose) after you have not eaten for a while (fasting). You may have this done every 1-3 years. Abdominal aortic aneurysm (AAA) screening. You may need this if you are a current or former smoker. Osteoporosis. You may be screened starting at age 82 if you are at high risk. Talk with your health care provider about your test results, treatment options, and if necessary, the need for more tests. Vaccines  Your health care provider may recommend certain vaccines, such as: Influenza vaccine. This is recommended  every year. Tetanus, diphtheria, and acellular pertussis (Tdap, Td) vaccine. You may need a Td booster every 10  years. Zoster vaccine. You may need this after age 31. Pneumococcal 13-valent conjugate (PCV13) vaccine. One dose is recommended after age 26. Pneumococcal polysaccharide (PPSV23) vaccine. One dose is recommended after age 95. Talk to your health care provider about which screenings and vaccines you need and how often you need them. This information is not intended to replace advice given to you by your health care provider. Make sure you discuss any questions you have with your health care provider. Document Released: 07/09/2015 Document Revised: 03/01/2016 Document Reviewed: 04/13/2015 Elsevier Interactive Patient Education  2017 [place] Prevention in the Home Falls can cause injuries. They can happen to people of all ages. There are many things you can do to make your home safe and to help prevent falls. What can I do on the outside of my home? Regularly fix the edges of walkways and driveways and fix any cracks. Remove anything that might make you trip as you walk through a door, such as a raised step or threshold. Trim any bushes or trees on the path to your home. Use bright outdoor lighting. Clear any walking paths of anything that might make someone trip, such as rocks or tools. Regularly check to see if handrails are loose or broken. Make sure that both sides of any steps have handrails. Any raised decks and porches should have guardrails on the edges. Have any leaves, snow, or ice cleared regularly. Use sand or salt on walking paths during winter. Clean up any spills in your garage right away. This includes oil or grease spills. What can I do in the bathroom? Use night lights. Install grab bars by the toilet and in the tub and shower. Do not use towel bars as grab bars. Use non-skid mats or decals in the tub or shower. If you need to sit down in the shower, use a plastic, non-slip stool. Keep the floor dry. Clean up any water that spills on the floor as soon as it  happens. Remove soap buildup in the tub or shower regularly. Attach bath mats securely with double-sided non-slip rug tape. Do not have throw rugs and other things on the floor that can make you trip. What can I do in the bedroom? Use night lights. Make sure that you have a light by your bed that is easy to reach. Do not use any sheets or blankets that are too big for your bed. They should not hang down onto the floor. Have a firm chair that has side arms. You can use this for support while you get dressed. Do not have throw rugs and other things on the floor that can make you trip. What can I do in the kitchen? Clean up any spills right away. Avoid walking on wet floors. Keep items that you use a lot in easy-to-reach places. If you need to reach something above you, use a strong step stool that has a grab bar. Keep electrical cords out of the way. Do not use floor polish or wax that makes floors slippery. If you must use wax, use non-skid floor wax. Do not have throw rugs and other things on the floor that can make you trip. What can I do with my stairs? Do not leave any items on the stairs. Make sure that there are handrails on both sides of the stairs and use them. Fix handrails that are broken  or loose. Make sure that handrails are as long as the stairways. Check any carpeting to make sure that it is firmly attached to the stairs. Fix any carpet that is loose or worn. Avoid having throw rugs at the top or bottom of the stairs. If you do have throw rugs, attach them to the floor with carpet tape. Make sure that you have a light switch at the top of the stairs and the bottom of the stairs. If you do not have them, ask someone to add them for you. What else can I do to help prevent falls? Wear shoes that: Do not have high heels. Have rubber bottoms. Are comfortable and fit you well. Are closed at the toe. Do not wear sandals. If you use a stepladder: Make sure that it is fully opened.  Do not climb a closed stepladder. Make sure that both sides of the stepladder are locked into place. Ask someone to hold it for you, if possible. Clearly mark and make sure that you can see: Any grab bars or handrails. First and last steps. Where the edge of each step is. Use tools that help you move around (mobility aids) if they are needed. These include: Canes. Walkers. Scooters. Crutches. Turn on the lights when you go into a dark area. Replace any light bulbs as soon as they burn out. Set up your furniture so you have a clear path. Avoid moving your furniture around. If any of your floors are uneven, fix them. If there are any pets around you, be aware of where they are. Review your medicines with your doctor. Some medicines can make you feel dizzy. This can increase your chance of falling. Ask your doctor what other things that you can do to help prevent falls. This information is not intended to replace advice given to you by your health care provider. Make sure you discuss any questions you have with your health care provider. Document Released: 04/08/2009 Document Revised: 11/18/2015 Document Reviewed: 07/17/2014 Elsevier Interactive Patient Education  2017 [place].   Diabetes Mellitus and Nutrition, Adult When you have diabetes, or diabetes mellitus, it is very important to have healthy eating habits because your blood sugar (glucose) levels are greatly affected by what you eat and drink. Eating healthy foods in the right amounts, at about the same times every day, can help you: Manage your blood glucose. Lower your risk of heart disease. Improve your blood pressure. Reach or maintain a healthy weight. What can affect my meal plan? Every person with diabetes is different, and each person has different needs for a meal plan. Your health care provider may recommend that you work with a dietitian to make a meal plan that is best for you. Your meal plan may vary depending on  factors such as: The calories you need. The medicines you take. Your weight. Your blood glucose, blood pressure, and cholesterol levels. Your activity level. Other health conditions you have, such as heart or kidney disease. How do carbohydrates affect me? Carbohydrates, also called carbs, affect your blood glucose level more than any other type of food. Eating carbs raises the amount of glucose in your blood. It is important to know how many carbs you can safely have in each meal. This is different for every person. Your dietitian can help you calculate how many carbs you should have at each meal and for each snack. How does alcohol affect me? Alcohol can cause a decrease in blood glucose (hypoglycemia), especially if you  use insulin or take certain diabetes medicines by mouth. Hypoglycemia can be a life-threatening condition. Symptoms of hypoglycemia, such as sleepiness, dizziness, and confusion, are similar to symptoms of having too much alcohol. Do not drink alcohol if: Your health care provider tells you not to drink. You are pregnant, may be pregnant, or are planning to become pregnant. If you drink alcohol: Limit how much you have to: 0-1 drink a day for women. 0-2 drinks a day for men. Know how much alcohol is in your drink. In the [place] one drink equals one 12 oz bottle of beer (355 mL), one 5 oz glass of wine (148 mL), or one 1 oz glass of hard liquor (44 mL). Keep yourself hydrated with water, diet soda, or unsweetened iced tea. Keep in mind that regular soda, juice, and other mixers may contain a lot of sugar and must be counted as carbs. What are tips for following this plan? Reading food labels Start by checking the serving size on the Nutrition Facts label of packaged foods and drinks. The number of calories and the amount of carbs, fats, and other nutrients listed on the label are based on one serving of the item. Many items contain more than one serving per package. Check  the total grams (g) of carbs in one serving. Check the number of grams of saturated fats and trans fats in one serving. Choose foods that have a low amount or none of these fats. Check the number of milligrams (mg) of salt (sodium) in one serving. Most people should limit total sodium intake to less than 2,300 mg per day. Always check the nutrition information of foods labeled as "low-fat" or "nonfat." These foods may be higher in added sugar or refined carbs and should be avoided. Talk to your dietitian to identify your daily goals for nutrients listed on the label. Shopping Avoid buying canned, pre-made, or processed foods. These foods tend to be high in fat, sodium, and added sugar. Shop around the outside edge of the grocery store. This is where you will most often find fresh fruits and vegetables, bulk grains, fresh meats, and fresh dairy products. Cooking Use low-heat cooking methods, such as baking, instead of high-heat cooking methods, such as deep frying. Cook using healthy oils, such as olive, canola, or sunflower oil. Avoid cooking with butter, cream, or high-fat meats. Meal planning Eat meals and snacks regularly, preferably at the same times every day. Avoid going long periods of time without eating. Eat foods that are high in fiber, such as fresh fruits, vegetables, beans, and whole grains. Eat 4-6 oz (112-168 g) of lean protein each day, such as lean meat, chicken, fish, eggs, or tofu. One ounce (oz) (28 g) of lean protein is equal to: 1 oz (28 g) of meat, chicken, or fish. 1 egg.  cup (62 g) of tofu. Eat some foods each day that contain healthy fats, such as avocado, nuts, seeds, and fish. What foods should I eat? Fruits Berries. Apples. Oranges. Peaches. Apricots. Plums. Grapes. Mangoes. Papayas. Pomegranates. Kiwi. Cherries. Vegetables Leafy greens, including lettuce, spinach, kale, chard, collard greens, mustard greens, and cabbage. Beets. Cauliflower. Broccoli. Carrots.  Green beans. Tomatoes. Peppers. Onions. Cucumbers. Brussels sprouts. Grains Whole grains, such as whole-wheat or whole-grain bread, crackers, tortillas, cereal, and pasta. Unsweetened oatmeal. Quinoa. Brown or wild rice. Meats and other proteins Seafood. Poultry without skin. Lean cuts of poultry and beef. Tofu. Nuts. Seeds. Dairy Low-fat or fat-free dairy products such as milk, yogurt, and cheese.  The items listed above may not be a complete list of foods and beverages you can eat and drink. Contact a dietitian for more information. What foods should I avoid? Fruits Fruits canned with syrup. Vegetables Canned vegetables. Frozen vegetables with butter or cream sauce. Grains Refined white flour and flour products such as bread, pasta, snack foods, and cereals. Avoid all processed foods. Meats and other proteins Fatty cuts of meat. Poultry with skin. Breaded or fried meats. Processed meat. Avoid saturated fats. Dairy Full-fat yogurt, cheese, or milk. Beverages Sweetened drinks, such as soda or iced tea. The items listed above may not be a complete list of foods and beverages you should avoid. Contact a dietitian for more information. Questions to ask a health care provider Do I need to meet with a certified diabetes care and education specialist? Do I need to meet with a dietitian? What number can I call if I have questions? When are the best times to check my blood glucose? Where to find more information: American Diabetes Association: diabetes.org Academy of Nutrition and Dietetics: eatright.[employer] of Diabetes and Digestive and Kidney Diseases: [employer] Association of Diabetes Care & Education Specialists: diabeteseducator.org Summary It is important to have healthy eating habits because your blood sugar (glucose) levels are greatly affected by what you eat and drink. It is important to use alcohol carefully. A healthy meal plan will help you manage your blood  glucose and lower your risk of heart disease. Your health care provider may recommend that you work with a dietitian to make a meal plan that is best for you. This information is not intended to replace advice given to you by your health care provider. Make sure you discuss any questions you have with your health care provider. Document Revised: 01/14/2020 Document Reviewed: 01/14/2020 Elsevier Patient Education  [place].

## 2021-07-28 NOTE — Telephone Encounter (Signed)
error 

## 2021-07-28 NOTE — Progress Notes (Signed)
Subjective:   Marvin Frost is a 76 y.o. male who presents for Medicare Annual/Subsequent preventive examination.  Virtual Visit via Telephone Note  I connected with  Marvin Frost on 07/28/21 at  3:30 PM EST by telephone and verified that I am speaking with the correct person using two identifiers.  Location: Patient: Home Provider: [facility] Persons participating in the virtual visit: patient/Nurse Health Advisor   I discussed the limitations, risks, security and privacy concerns of performing an evaluation and management service by telephone and the availability of in person appointments. The patient expressed understanding and agreed to proceed.  Interactive audio and video telecommunications were attempted between this nurse and patient, however failed, due to patient having technical difficulties OR patient did not have access to video capability.  We continued and completed visit with audio only.  Some vital signs may be absent or patient reported.   Kiyan Burmester E Aylanie Cubillos, LPN   Review of Systems     Cardiac Risk Factors include: advanced age (>52men, >26 women);male gender;obesity (BMI >30kg/m2);diabetes mellitus;Other (see comment), Risk factor comments: CAD, ASCVD     Objective:    Today's Vitals   07/28/21 1535  Weight: 205 lb (93 kg)   Body mass index is 31.17 kg/m.  Advanced Directives 07/28/2021 07/26/2020 07/24/2019 07/22/2018 05/27/2018 07/26/2017 07/10/2017  Does Patient Have a Medical Advance Directive? Yes Yes Yes Yes No Yes Yes  Type of Paramedic of [place];Living will [place];Living will Living will;Healthcare Power of [place];Living will - - -  Does patient want to make changes to medical advance directive? - No - Patient declined No - Patient declined No - Patient declined - - -  Copy of [place] in Chart? Yes - validated most recent copy scanned in chart (See row information) No -  copy requested No - copy requested No - copy requested - - -  Would patient like information on creating a medical advance directive? - - - - No - Patient declined - -  Pre-existing out of facility DNR order (yellow form or pink MOST form) - - - - - - -    Current Medications (verified) Outpatient Encounter Medications as of 07/28/2021  Medication Sig   aspirin EC 81 MG EC tablet Take 1 tablet (81 mg total) by mouth daily.   Blood Glucose Monitoring Suppl (FREESTYLE LITE) DEVI Use to check BS bid. DX. E11.9   cetirizine (ZYRTEC) 10 MG tablet Take 10 mg by mouth as needed for allergies or rhinitis.    Continuous Blood Gluc Sensor (FREESTYLE LIBRE 14 DAY SENSOR) MISC Inject 1 each into the skin every 14 (fourteen) days. Use as directed.   Dulaglutide (TRULICITY) 4.5 GY/6.9SW SOPN Inject 4 mg into the skin once a week.   Evolocumab (REPATHA SURECLICK) 546 MG/ML SOAJ Inject 140 mg into the skin every 14 (fourteen) days.   ferrous sulfate 325 (65 FE) MG tablet Take 1 tablet (325 mg total) by mouth every other day. (Patient taking differently: Take 325 mg by mouth every morning.)   insulin degludec (TRESIBA FLEXTOUCH) 200 UNIT/ML FlexTouch Pen Inject 72 Units into the skin at bedtime.   Insulin Pen Needle (NOVOTWIST) 32G X 5 MM MISC USE WITH INSULIN PENS UP TO 5 TIMES DAILY Dx E11.59   Insulin Pen Needle 32G X 5 MM MISC by Does not apply route. Use with insulin pen needles up to 5 times daily  E11.9   Lancets (FREESTYLE)  lancets Check BS BID and PRN. DX.E11.9   metFORMIN (GLUCOPHAGE-XR) 750 MG 24 hr tablet Take 1 tablet (750 mg total) by mouth daily with breakfast.   metoprolol succinate (TOPROL-XL) 100 MG 24 hr tablet TAKE 1 TABLET DAILY FOR BLOOD PRESSURE CONTROL   olmesartan-hydrochlorothiazide (BENICAR HCT) 40-25 MG tablet Take 1 tablet by mouth daily.   pantoprazole (PROTONIX) 40 MG tablet Take 1 tablet (40 mg total) by mouth daily.   rosuvastatin (CRESTOR) 40 MG tablet Take 1 tablet (40 mg  total) by mouth every 3 (three) days.   SURE COMFORT PEN NEEDLES 32G X 6 MM MISC    terbinafine (LAMISIL) 1 % cream Apply 1 application topically 2 (two) times daily. (Patient taking differently: Apply 1 application topically 2 (two) times daily. As needed)   amoxicillin (AMOXIL) 500 MG capsule Take 4 capsules (2,000 mg total) by mouth See admin instructions. Take four capsules (2000 mg) by mouth one hour prior to dental appointments (Patient not taking: Reported on 07/28/2021)   nitroGLYCERIN (NITROSTAT) 0.4 MG SL tablet DISSOLVE 1 TABLET UNDER THE TONGUE EVERY 5 MINUTES AS NEEDED FOR CHEST PAIN   No facility-administered encounter medications on file as of 07/28/2021.    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    Cataract    both removed   Cervical radiculopathy 09/14/2020   Diabetes mellitus 1995   Dysrhythmia    history of heart palpitations    Gastric polyps 09/18/2017   GERD (gastroesophageal reflux disease)    History of frequent urinary tract infections    History of hiatal hernia    Hyperlipemia    Hypertension    Obesity    Pancreatitis    Past Surgical History:  Procedure Laterality Date   CARDIAC CATHETERIZATION     2004 and 1999- done by Dr Percival Spanish    COLONOSCOPY  11/2003   diverticulosis, no polyps   CORONARY STENT INTERVENTION N/A 05/28/2018   Procedure: CORONARY STENT INTERVENTION;  Surgeon: Sherren Mocha, MD;  Location: [facility];  Service: Cardiovascular;  Laterality: N/A;   EYE SURGERY Bilateral    cateracts - dr Katy Fitch   HYDROCELE EXCISION / REPAIR      INTRAVASCULAR PRESSURE WIRE/FFR STUDY N/A 05/28/2018   Procedure: INTRAVASCULAR PRESSURE WIRE/FFR STUDY;  Surgeon: Sherren Mocha, MD;  Location: [facility];  Service: Cardiovascular;  Laterality: N/A;   JOINT REPLACEMENT  2017   both hips   left calf surgery      related to trauma of being torn    LEFT HEART CATH AND CORONARY ANGIOGRAPHY N/A 05/28/2018   Procedure: LEFT HEART CATH AND CORONARY ANGIOGRAPHY;  Surgeon: Sherren Mocha, MD;  Location: [facility];  Service: Cardiovascular;  Laterality: N/A;   left index finger arthrioscopic surgery      lymph node removed from right neck      right shoulder surgery     TOTAL HIP ARTHROPLASTY Right 03/16/2015   Procedure: RIGHT TOTAL HIP ARTHROPLASTY ANTERIOR APPROACH;  Surgeon: Paralee Cancel, MD;  Location: [facility];  Service: Orthopedics;  Laterality: Right;   TOTAL HIP ARTHROPLASTY  Left 06/22/2015   Procedure: LEFT TOTAL HIP ARTHROPLASTY ANTERIOR APPROACH;  Surgeon: Paralee Cancel, MD;  Location: [facility];  Service: Orthopedics;  Laterality: Left;   UPPER GASTROINTESTINAL ENDOSCOPY     hiatal hernia 2cm, mild gastritis - 2012, multiple hyperplastic and fundic gland gastric polyps 2019   Family History  Problem Relation Age of Onset   Heart disease Mother    Heart attack Mother    Diabetes Brother    Heart disease Brother    Heart attack Brother 34   Heart attack Father 11   Early death Father    Heart disease Father    GI problems Sister        torn esophagus during endoscopy   Healthy Daughter    Colon polyps Neg Hx    Esophageal cancer Neg Hx    Rectal cancer Neg Hx    Stomach cancer Neg Hx    Colon cancer Neg Hx    Social History   Socioeconomic History   Marital status: Married    Spouse name: [name]   Number of children: 1   Years of education: Some College   Highest education level: Associate degree: occupational, Hotel manager, or vocational program  Occupational History   Occupation: Best boy:  [employer]    Comment: retired  Tobacco Use   Smoking status: Former    Packs/day: 1.00    Years: 10.00    Pack years: 10.00    Types: Cigarettes    Quit date: 04/06/1984    Years since quitting: 37.3   Smokeless tobacco: Former    Types: Chew    Quit date: 06/26/1978   Tobacco comments:    I do not smoke  Vaping Use   Vaping Use: Never used  Substance and Sexual Activity   Alcohol use: No   Drug use: No   Sexual activity: Yes    Birth control/protection: None  Other Topics Concern   Not on file  Social History Narrative   Married   Retired from [employer]   1 child   Former but no current tobacco   No EtOH/drugs   Social Determinants of Radio broadcast assistant Strain: Low Risk    Difficulty of Paying Living Expenses: Not hard at all  Food Insecurity: No Food Insecurity   Worried About Charity fundraiser in the Last Year: Never true   Arboriculturist in the Last Year: Never true  Transportation Needs: No Transportation Needs   Lack of Transportation (Medical): No   Lack of Transportation (Non-Medical): No  Physical Activity: Insufficiently Active   Days of Exercise per Week: 4 days   Minutes of Exercise per Session: 30 min  Stress: No Stress Concern Present   Feeling of Stress : Not at all  Social Connections: Moderately Integrated   Frequency of Communication with Friends and Family: Once a week   Frequency of Social Gatherings with Friends and Family: Once a week   Attends Religious Services: More than 4 times per year   Active Member of [employer] or Organizations: Yes   Attends Music therapist: More than 4 times per year   Marital Status: Married    Tobacco Counseling Counseling given: Not Answered Tobacco comments: I do not smoke   Clinical Intake:  Pre-visit preparation completed: Yes  Pain : No/denies pain     BMI - recorded: 31.17 Nutritional Status: BMI > 30  Obese Nutritional Risks: None Diabetes: Yes CBG  done?:  No Did pt. bring in CBG monitor from home?: No  How often do you need to have someone help you when you read instructions, pamphlets, or other written materials from your doctor or pharmacy?: 1 - Never  Diabetic? Nutrition Risk Assessment:  Has the patient had any N/V/D within the last 2 months?  No  Does the patient have any non-healing wounds?  No  Has the patient had any unintentional weight loss or weight gain?  No   Diabetes:  Is the patient diabetic?  Yes  If diabetic, was a CBG obtained today?  No  Did the patient bring in their glucometer from home?  No  How often do you monitor your CBG's? Several times per day - has Freestyle Libre CGM.   Financial Strains and Diabetes Management:  Are you having any financial strains with the device, your supplies or your medication? No .  Does the patient want to be seen by Chronic Care Management for management of their diabetes?  No  Would the patient like to be referred to a Nutritionist or for Diabetic Management?  No   Diabetic Exams:  Diabetic Eye Exam: Completed 03/31/2021.   Diabetic Foot Exam: Completed 07/06/2021. Pt has been advised about the importance in completing this exam. Pt is scheduled for diabetic foot exam on next year.    Interpreter Needed?: No  Information entered by :: Venesa Semidey, LPN   Activities of Daily Living In your present state of health, do you have any difficulty performing the following activities: 07/28/2021 07/27/2021  Hearing? N N  Vision? N N  Difficulty concentrating or making decisions? N N  Walking or climbing stairs? N N  Dressing or bathing? N N  Doing errands, shopping? N N  Preparing Food and eating ? N N  Using the Toilet? N -  In the past six months, have you accidently leaked urine? N -  Do you have problems with loss of bowel control? N -  Managing your Medications? N N  Managing your Finances? N N  Housekeeping or managing your Housekeeping? N N  Some recent data might be  hidden    Patient Care Team: [name], MD as PCP - General (Family Medicine) Minus Breeding, MD as PCP - Cardiology (Cardiology) Paralee Cancel, MD as Consulting Physician (Orthopedic Surgery) Minus Breeding, MD as Consulting Physician (Cardiology) Cassandria Anger, MD as Consulting Physician (Endocrinology) [facility]  Indicate any recent Medical Services you may have received from other than [facility] providers in the past year (date may be approximate).     Assessment:   This is a routine wellness examination for [place].  Hearing/Vision screen Hearing Screening - Comments:: Denies hearing difficulties   Vision Screening - Comments:: Wears otc reading glasses prn only - up to date with routine eye exams with [facility]  Dietary issues and exercise activities discussed: Current Exercise Habits: Home exercise routine, Type of exercise: walking;strength training/weights, Time (Minutes): 30, Frequency (Times/Week): 4, Weekly Exercise (Minutes/Week): 120, Intensity: Moderate, Exercise limited by: orthopedic condition(s);cardiac condition(s)   Goals Addressed             This Visit's Progress    AWV   On track    07/28/2021 AWV Goal: Diabetes Management  Patient will maintain an A1C level below 8.0 Patient will not develop any diabetic foot complications Patient will not experience any hypoglycemic episodes over the next 3 months Patient will notify our office of any CBG readings outside of  the provider recommended range by calling [phone] Patient will adhere to provider recommendations for diabetes management  Patient Self Management Activities take all medications as prescribed and report any negative side effects monitor and record blood sugar readings as directed adhere to a low carbohydrate diet that incorporates lean proteins, vegetables, whole grains, low glycemic fruits check feet daily noting any sores, cracks, injuries, or callous  formations see PCP or podiatrist if he notices any changes in his legs, feet, or toenails Patient will visit PCP and have an A1C level checked every 3 to 6 months as directed  have a yearly eye exam to monitor for vascular changes associated with diabetes and will request that the report be sent to his pcp.  consult with his PCP regarding any changes in his health or new or worsening symptoms        Depression Screen PHQ 2/9 Scores 07/28/2021 05/12/2021 02/08/2021 11/08/2020 10/06/2020 07/26/2020 07/07/2020  PHQ - 2 Score 0 0 0 0 0 0 0  PHQ- 9 Score - 0 - - - - -    Fall Risk Fall Risk  07/28/2021 07/27/2021 05/12/2021 02/08/2021 11/08/2020  Falls in the past year? 0 0 0 0 0  Number falls in past yr: 0 - - - -  Injury with Fall? 0 - - - -  Risk for fall due to : Orthopedic patient - - - -  Follow up Falls prevention discussed - Falls evaluation completed - -    FALL RISK PREVENTION PERTAINING TO THE HOME:  Any stairs in or around the home? Yes  If so, are there any without handrails? No  Home free of loose throw rugs in walkways, pet beds, electrical cords, etc? Yes  Adequate lighting in your home to reduce risk of falls? Yes   ASSISTIVE DEVICES UTILIZED TO PREVENT FALLS:  Life alert? No  Use of a cane, walker or w/c? No  Grab bars in the bathroom? No  Shower chair or bench in shower? Yes  Elevated toilet seat or a handicapped toilet? Yes   TIMED UP AND GO:  Was the test performed? No . Telephonic visit  Cognitive Function: MMSE - Mini Mental State Exam 07/23/2018 05/08/2017 11/12/2015 11/05/2014  Orientation to time 5 5 5 5   Orientation to Place 5 5 5 5   Registration 3 3 3 3   Attention/ Calculation 5 5 5 5   Recall 3 3 3 3   Language- name 2 objects 2 2 2 2   Language- repeat 1 1 1 1   Language- follow 3 step command 3 3 3 3   Language- read & follow direction 1 1 1 1   Write a sentence 1 1 1 1   Copy design 1 1 1 1   Total score 30 30 30 30      6CIT Screen 07/26/2020 07/24/2019   What Year? 0 points 0 points  What month? 0 points 0 points  What time? 0 points 0 points  Count back from 20 0 points 0 points  Months in reverse 0 points 0 points  Repeat phrase 0 points 0 points  Total Score 0 0    Immunizations Immunization History  Administered Date(s) Administered   Fluad Quad(high Dose 65+) 03/26/2019, 04/19/2020, 05/12/2021   Influenza Whole 02/24/2010   Influenza, High Dose Seasonal PF 04/15/2015, 04/12/2016, 04/05/2017, 04/16/2018   Influenza,inj,Quad PF,6+ Mos 03/26/2013, 03/30/2014   Moderna Sars-Covid-2 Vaccination 07/31/2019, 08/28/2019, 03/15/2020   Pneumococcal Conjugate-13 06/17/2013   Pneumococcal Polysaccharide-23 02/24/2009   Td 02/24/2006   Tdap 04/27/2011  Zoster Recombinat (Shingrix) 07/22/2018   Zoster, Live 07/27/2006    TDAP status: Due, Education has been provided regarding the importance of this vaccine. Advised may receive this vaccine at local pharmacy or Health Dept. Aware to provide a copy of the vaccination record if obtained from local pharmacy or Health Dept. Verbalized acceptance and understanding.  Flu Vaccine status: Up to date  Pneumococcal vaccine status: Up to date  Covid-19 vaccine status: Completed vaccines  Qualifies for Shingles Vaccine? Yes   Zostavax completed Yes   Shingrix Completed?: No.    Education has been provided regarding the importance of this vaccine. Patient has been advised to call insurance company to determine out of pocket expense if they have not yet received this vaccine. Advised may also receive vaccine at local pharmacy or Health Dept. Verbalized acceptance and understanding.  Screening Tests Health Maintenance  Topic Date Due   Zoster Vaccines- Shingrix (2 of 2) 09/16/2018   COVID-19 Vaccine (4 - Booster for Moderna series) 05/10/2020   TETANUS/TDAP  04/26/2021   HEMOGLOBIN A1C  11/09/2021   OPHTHALMOLOGY EXAM  03/31/2022   FOOT EXAM  07/06/2022   Pneumonia Vaccine 79+ Years old   Completed   INFLUENZA VACCINE  Completed   Hepatitis C Screening  Completed   HPV VACCINES  Aged Out    Health Maintenance  Health Maintenance Due  Topic Date Due   Zoster Vaccines- Shingrix (2 of 2) 09/16/2018   COVID-19 Vaccine (4 - Booster for Moderna series) 05/10/2020   TETANUS/TDAP  04/26/2021    Colorectal cancer screening: No longer required.   Lung Cancer Screening: (Low Dose CT Chest recommended if Age 31-80 years, 30 pack-year currently smoking OR have quit w/in 15years.) does not qualify.   Additional Screening:  Hepatitis C Screening: does qualify; Completed 11/12/2015  Vision Screening: Recommended annual ophthalmology exams for early detection of glaucoma and other disorders of the eye. Is the patient up to date with their annual eye exam?  Yes  Who is the provider or what is the name of the office in which the patient attends annual eye exams? [facility] If pt is not established with a provider, would they like to be referred to a provider to establish care? No .   Dental Screening: Recommended annual dental exams for proper oral hygiene  Community Resource Referral / Chronic Care Management: CRR required this visit?  No   CCM required this visit?  No      Plan:     I have personally reviewed and noted the following in the patients chart:   Medical and social history Use of alcohol, tobacco or illicit drugs  Current medications and supplements including opioid prescriptions. Patient is not currently taking opioid prescriptions. Functional ability and status Nutritional status Physical activity Advanced directives List of other physicians Hospitalizations, surgeries, and ER visits in previous 12 months Vitals Screenings to include cognitive, depression, and falls Referrals and appointments  In addition, I have reviewed and discussed with patient certain preventive protocols, quality metrics, and best practice recommendations. A written personalized  care plan for preventive services as well as general preventive health recommendations were provided to patient.     Sandrea Hammond, LPN   11/01/5275   Nurse Notes: Trulicity is on back-order - he has enough to last 3-4 weeks, but is worried about running out after that.

## 2021-08-11 ENCOUNTER — Ambulatory Visit: Payer: Medicare Other | Admitting: Family Medicine

## 2021-08-18 ENCOUNTER — Ambulatory Visit (INDEPENDENT_AMBULATORY_CARE_PROVIDER_SITE_OTHER): Payer: Medicare Other

## 2021-08-18 ENCOUNTER — Ambulatory Visit (INDEPENDENT_AMBULATORY_CARE_PROVIDER_SITE_OTHER): Payer: Medicare Other | Admitting: Family Medicine

## 2021-08-18 ENCOUNTER — Encounter: Payer: Self-pay | Admitting: Family Medicine

## 2021-08-18 VITALS — BP 124/73 | HR 77 | Temp 97.6°F | Ht 68.0 in | Wt 203.8 lb

## 2021-08-18 DIAGNOSIS — E782 Mixed hyperlipidemia: Secondary | ICD-10-CM | POA: Diagnosis not present

## 2021-08-18 DIAGNOSIS — R0989 Other specified symptoms and signs involving the circulatory and respiratory systems: Secondary | ICD-10-CM

## 2021-08-18 DIAGNOSIS — E1159 Type 2 diabetes mellitus with other circulatory complications: Secondary | ICD-10-CM

## 2021-08-18 DIAGNOSIS — I10 Essential (primary) hypertension: Secondary | ICD-10-CM | POA: Diagnosis not present

## 2021-08-18 DIAGNOSIS — E559 Vitamin D deficiency, unspecified: Secondary | ICD-10-CM | POA: Diagnosis not present

## 2021-08-18 DIAGNOSIS — J9811 Atelectasis: Secondary | ICD-10-CM | POA: Diagnosis not present

## 2021-08-18 DIAGNOSIS — R0602 Shortness of breath: Secondary | ICD-10-CM | POA: Diagnosis not present

## 2021-08-18 DIAGNOSIS — R059 Cough, unspecified: Secondary | ICD-10-CM | POA: Diagnosis not present

## 2021-08-18 LAB — BAYER DCA HB A1C WAIVED: HB A1C (BAYER DCA - WAIVED): 7.8 % — ABNORMAL HIGH (ref 4.8–5.6)

## 2021-08-18 MED ORDER — TRULICITY 4.5 MG/0.5ML ~~LOC~~ SOAJ: 4.5000 mg | SUBCUTANEOUS | 3 refills | Status: DC

## 2021-08-18 MED ORDER — FEXOFENADINE HCL 180 MG PO TABS: 180.0000 mg | ORAL_TABLET | Freq: Every day | ORAL | 11 refills | Status: DC

## 2021-08-18 MED ORDER — FLUOCINOLONE ACETONIDE 0.01 % EX SOLN: Freq: Two times a day (BID) | CUTANEOUS | 5 refills | Status: DC

## 2021-08-18 NOTE — Progress Notes (Signed)
Subjective:  Patient ID: Marvin Frost,  male    DOB: 1946-02-09  Age: 76 y.o.    CC: Medical Management of Chronic Issues   HPI Marvin Frost presents for  follow-up of hypertension. Patient has no history of headache chest pain or shortness of breath or recent cough. Patient also denies symptoms of TIA such as numbness weakness lateralizing. Patient denies side effects from medication. States taking it regularly.  Patient also  in for follow-up of elevated cholesterol. Doing well without complaints on current medication. Denies side effects  including myalgia and arthralgia and nausea. Also in today for liver function testing. Currently no chest pain, shortness of breath or other cardiovascular related symptoms noted.70  Follow-up of diabetes. Patient does check blood sugar at home. Readings run between 85 and 140 fasting.  Avg on meter is 174 Patient denies symptoms such as excessive hunger or urinary frequency, excessive hunger, nausea No significant hypoglycemic spells noted. Medications reviewed. Pt reports taking them regularly. Pt. denies complication/adverse reaction today.    History Marvin Frost has a past medical history of Allergy, Anemia, Arthritis, CAD (coronary artery disease), Cancer ([place]), Cataract, Cervical radiculopathy (09/14/2020), Diabetes mellitus (1995), Dysrhythmia, Gastric polyps (09/18/2017), GERD (gastroesophageal reflux disease), History of frequent urinary tract infections, History of hiatal hernia, Hyperlipemia, Hypertension, Obesity, and Pancreatitis.   He has a past surgical history that includes right shoulder surgery; Hydrocele excision / repair; Colonoscopy (11/2003); Upper gastrointestinal endoscopy; Cardiac catheterization; left calf surgery ; left index finger arthrioscopic surgery ; lymph node removed from right neck ; Total hip arthroplasty (Right, 03/16/2015); Total hip arthroplasty (Left, 06/22/2015); Eye surgery (Bilateral); LEFT HEART CATH AND CORONARY  ANGIOGRAPHY (N/A, 05/28/2018); CORONARY STENT INTERVENTION (N/A, 05/28/2018); INTRAVASCULAR PRESSURE WIRE/FFR STUDY (N/A, 05/28/2018); and Joint replacement (2017).   His family history includes Diabetes in his brother; Early death in his father; GI problems in his sister; Healthy in his daughter; Heart attack in his mother; Heart attack (age of onset: 87) in his father; Heart attack (age of onset: 76) in his brother; Heart disease in his brother, father, and mother.He reports that he quit smoking about 37 years ago. His smoking use included cigarettes. He has a 10.00 pack-year smoking history. He quit smokeless tobacco use about 43 years ago.  His smokeless tobacco use included chew. He reports that he does not drink alcohol and does not use drugs.  Current Outpatient Medications on File Prior to Visit  Medication Sig Dispense Refill   aspirin EC 81 MG EC tablet Take 1 tablet (81 mg total) by mouth daily.     Blood Glucose Monitoring Suppl (FREESTYLE LITE) DEVI Use to check BS bid. DX. E11.9 1 each 0   cetirizine (ZYRTEC) 10 MG tablet Take 10 mg by mouth as needed for allergies or rhinitis.      Continuous Blood Gluc Sensor (FREESTYLE LIBRE 14 DAY SENSOR) MISC Inject 1 each into the skin every 14 (fourteen) days. Use as directed. 6 each 2   Evolocumab (REPATHA SURECLICK) 939 MG/ML SOAJ Inject 140 mg into the skin every 14 (fourteen) days. 6 mL 4   ferrous sulfate 325 (65 FE) MG tablet Take 1 tablet (325 mg total) by mouth every other day. (Patient taking differently: Take 325 mg by mouth every morning.) 90 tablet 1   insulin degludec (TRESIBA FLEXTOUCH) 200 UNIT/ML FlexTouch Pen Inject 72 Units into the skin at bedtime. 30 mL 5   Insulin Pen Needle (NOVOTWIST) 32G X 5 MM MISC USE WITH INSULIN PENS UP  TO 5 TIMES DAILY Dx E11.59 500 each 4   Insulin Pen Needle 32G X 5 MM MISC by Does not apply route. Use with insulin pen needles up to 5 times daily  E11.9     Lancets (FREESTYLE) lancets Check BS BID  and PRN. DX.E11.9 300 each 3   metFORMIN (GLUCOPHAGE-XR) 750 MG 24 hr tablet Take 1 tablet (750 mg total) by mouth daily with breakfast. 180 tablet 3   metoprolol succinate (TOPROL-XL) 100 MG 24 hr tablet TAKE 1 TABLET DAILY FOR BLOOD PRESSURE CONTROL 90 tablet 1   nitroGLYCERIN (NITROSTAT) 0.4 MG SL tablet DISSOLVE 1 TABLET UNDER THE TONGUE EVERY 5 MINUTES AS NEEDED FOR CHEST PAIN 25 tablet 11   olmesartan-hydrochlorothiazide (BENICAR HCT) 40-25 MG tablet Take 1 tablet by mouth daily. 90 tablet 1   pantoprazole (PROTONIX) 40 MG tablet Take 1 tablet (40 mg total) by mouth daily. 90 tablet 3   rosuvastatin (CRESTOR) 40 MG tablet Take 1 tablet (40 mg total) by mouth every 3 (three) days. 90 tablet 1   SURE COMFORT PEN NEEDLES 32G X 6 MM MISC      terbinafine (LAMISIL) 1 % cream Apply 1 application topically 2 (two) times daily. (Patient taking differently: Apply 1 application topically 2 (two) times daily. As needed) 30 g 0   amoxicillin (AMOXIL) 500 MG capsule Take 4 capsules (2,000 mg total) by mouth See admin instructions. Take four capsules (2000 mg) by mouth one hour prior to dental appointments (Patient not taking: Reported on 07/28/2021) 4 capsule 10   No current facility-administered medications on file prior to visit.    ROS Review of Systems  Constitutional:  Negative for fever.  HENT:  Positive for congestion (with phlegm) and rhinorrhea (for about 5 months).   Respiratory:  Negative for shortness of breath.   Cardiovascular:  Negative for chest pain.  Musculoskeletal:  Negative for arthralgias.  Skin:  Positive for rash (bumops on scalp that itch.).   Objective:  BP 124/73    Pulse 77    Temp 97.6 F (36.4 C)    Ht _0  (1.727 m)    Wt 203 lb 12.8 oz (92.4 kg)    SpO2 95%    BMI 30.99 kg/m   BP Readings from Last 3 Encounters:  08/18/21 124/73  07/06/21 112/73  05/12/21 134/77    Wt Readings from Last 3 Encounters:  08/18/21 203 lb 12.8 oz (92.4 kg)  07/28/21 205 lb (93  kg)  07/06/21 207 lb (93.9 kg)     Physical Exam Vitals reviewed.  Constitutional:      Appearance: He is well-developed.  HENT:     Head: Normocephalic and atraumatic.     Right Ear: External ear normal.     Left Ear: External ear normal.     Mouth/Throat:     Pharynx: No oropharyngeal exudate or posterior oropharyngeal erythema.  Eyes:     Pupils: Pupils are equal, round, and reactive to light.  Cardiovascular:     Rate and Rhythm: Normal rate and regular rhythm.     Heart sounds: No murmur heard. Pulmonary:     Effort: No respiratory distress.     Breath sounds: Normal breath sounds.  Musculoskeletal:     Cervical back: Normal range of motion and neck supple.  Skin:    Findings: Rash (sligh scaly eruption scattered over crown of scalp) present.  Neurological:     Mental Status: He is alert and oriented to person, place, and  time.    Diabetic Foot Exam - Simple   No data filed       Assessment & Plan:   Marvin Frost was seen today for medical management of chronic issues.  Diagnoses and all orders for this visit:  Essential hypertension, benign -     CBC with Differential/Platelet -     CMP14+EGFR  Mixed hyperlipidemia -     Lipid panel  Vitamin D deficiency -     VITAMIN D 25 Hydroxy (Vit-D Deficiency, Fractures)  DM type 2 causing vascular disease (HCC) -     Bayer DCA Hb A1c Waived  Chest congestion -     DG Chest 2 View; Future -     Perennial allergen profile IgE  Other orders -     Dulaglutide (TRULICITY) 4.5 YZ/7.0DU SOPN; Inject 4.5 mg into the skin once a week. -     fluocinolone (SYNALAR) 0.01 % external solution; Apply topically 2 (two) times daily. To scalp -     fexofenadine (ALLEGRA) 180 MG tablet; Take 1 tablet (180 mg total) by mouth daily. For allergy symptoms   I have changed [place]. I am also having him start on fluocinolone and fexofenadine. Additionally, I am having him maintain his FreeStyle Lite, cetirizine,  freestyle, aspirin, ferrous sulfate, FreeStyle Libre 14 Day Sensor, terbinafine, pantoprazole, Tresiba FlexTouch, nitroGLYCERIN, Repatha SureClick, amoxicillin, Insulin Pen Needle, rosuvastatin, olmesartan-hydrochlorothiazide, metoprolol succinate, metFORMIN, Insulin Pen Needle, and Sure Comfort Pen Needles.  Meds ordered this encounter  Medications   Dulaglutide (TRULICITY) 4.5 KR/8.3KF SOPN    Sig: Inject 4.5 mg into the skin once a week.    Dispense:  6 mL    Refill:  3   fluocinolone (SYNALAR) 0.01 % external solution    Sig: Apply topically 2 (two) times daily. To scalp    Dispense:  60 mL    Refill:  5   fexofenadine (ALLEGRA) 180 MG tablet    Sig: Take 1 tablet (180 mg total) by mouth daily. For allergy symptoms    Dispense:  30 tablet    Refill:  11     Follow-up: Return in about 3 months (around 11/15/2021).  [name], M.D.

## 2021-08-19 LAB — CBC WITH DIFFERENTIAL/PLATELET
Basophils Absolute: 0.1 10*3/uL (ref 0.0–0.2)
Basos: 2 %
EOS (ABSOLUTE): 0.4 10*3/uL (ref 0.0–0.4)
Eos: 5 %
Hematocrit: 47.3 % (ref 37.5–51.0)
Hemoglobin: 15.7 g/dL (ref 13.0–17.7)
Immature Grans (Abs): 0.1 10*3/uL (ref 0.0–0.1)
Immature Granulocytes: 1 %
Lymphocytes Absolute: 1.5 10*3/uL (ref 0.7–3.1)
Lymphs: 17 %
MCH: 29.1 pg (ref 26.6–33.0)
MCHC: 33.2 g/dL (ref 31.5–35.7)
MCV: 88 fL (ref 79–97)
Monocytes Absolute: 0.7 10*3/uL (ref 0.1–0.9)
Monocytes: 8 %
Neutrophils Absolute: 5.6 10*3/uL (ref 1.4–7.0)
Neutrophils: 67 %
Platelets: 219 10*3/uL (ref 150–450)
RBC: 5.39 x10E6/uL (ref 4.14–5.80)
RDW: 14.5 % (ref 11.6–15.4)
WBC: 8.4 10*3/uL (ref 3.4–10.8)

## 2021-08-19 LAB — CMP14+EGFR
ALT: 18 IU/L (ref 0–44)
AST: 17 IU/L (ref 0–40)
Albumin/Globulin Ratio: 1.7 (ref 1.2–2.2)
Albumin: 4 g/dL (ref 3.7–4.7)
Alkaline Phosphatase: 60 IU/L (ref 44–121)
BUN/Creatinine Ratio: 10 (ref 10–24)
BUN: 11 mg/dL (ref 8–27)
Bilirubin Total: 0.3 mg/dL (ref 0.0–1.2)
CO2: 25 mmol/L (ref 20–29)
Calcium: 9.3 mg/dL (ref 8.6–10.2)
Chloride: 99 mmol/L (ref 96–106)
Creatinine, Ser: 1.05 mg/dL (ref 0.76–1.27)
Globulin, Total: 2.4 g/dL (ref 1.5–4.5)
Glucose: 155 mg/dL — ABNORMAL HIGH (ref 70–99)
Potassium: 4.6 mmol/L (ref 3.5–5.2)
Sodium: 138 mmol/L (ref 134–144)
Total Protein: 6.4 g/dL (ref 6.0–8.5)
eGFR: 73 mL/min/{1.73_m2} (ref >59)

## 2021-08-19 LAB — VITAMIN D 25 HYDROXY (VIT D DEFICIENCY, FRACTURES): Vit D, 25-Hydroxy: 48.7 ng/mL (ref 30.0–100.0)

## 2021-08-19 LAB — LIPID PANEL
Chol/HDL Ratio: 3.2 ratio (ref 0.0–5.0)
Cholesterol, Total: 112 mg/dL (ref 100–199)
HDL: 35 mg/dL — ABNORMAL LOW (ref >39)
LDL Chol Calc (NIH): 36 mg/dL (ref 0–99)
Triglycerides: 272 mg/dL — ABNORMAL HIGH (ref 0–149)
VLDL Cholesterol Cal: 41 mg/dL — ABNORMAL HIGH (ref 5–40)

## 2021-08-22 NOTE — Progress Notes (Signed)
Your chest x-ray looked normal. Thanks, WS.

## 2021-08-22 NOTE — Progress Notes (Signed)
Hello Efe,  Your lab result is normal and/or stable.Some minor variations that are not significant are commonly marked abnormal, but do not represent any medical problem for you.  Best regards, Anberlin Diez, M.D.

## 2021-08-23 LAB — ALLERGEN PROFILE, PERENNIAL ALLERGEN IGE
Alternaria Alternata IgE: <0.1 kU/L
Aspergillus Fumigatus IgE: <0.1 kU/L
Aureobasidi Pullulans IgE: <0.1 kU/L
Candida Albicans IgE: <0.1 kU/L
Cat Dander IgE: <0.1 kU/L
Chicken Feathers IgE: <0.1 kU/L
Cladosporium Herbarum IgE: <0.1 kU/L
Cow Dander IgE: 0.15 kU/L — AB
D Farinae IgE: <0.1 kU/L
D Pteronyssinus IgE: <0.1 kU/L
Dog Dander IgE: <0.1 kU/L
Duck Feathers IgE: <0.1 kU/L
Goose Feathers IgE: <0.1 kU/L
Mouse Urine IgE: <0.1 kU/L
Mucor Racemosus IgE: <0.1 kU/L
Penicillium Chrysogen IgE: <0.1 kU/L
Phoma Betae IgE: <0.1 kU/L
Setomelanomma Rostrat: <0.1 kU/L
Stemphylium Herbarum IgE: <0.1 kU/L

## 2021-10-04 ENCOUNTER — Ambulatory Visit (INDEPENDENT_AMBULATORY_CARE_PROVIDER_SITE_OTHER): Payer: Medicare Other | Admitting: Nurse Practitioner

## 2021-10-04 ENCOUNTER — Encounter: Payer: Self-pay | Admitting: Nurse Practitioner

## 2021-10-04 VITALS — BP 107/66 | HR 72 | Ht 68.0 in | Wt 203.6 lb

## 2021-10-04 DIAGNOSIS — I10 Essential (primary) hypertension: Secondary | ICD-10-CM

## 2021-10-04 DIAGNOSIS — E782 Mixed hyperlipidemia: Secondary | ICD-10-CM | POA: Diagnosis not present

## 2021-10-04 DIAGNOSIS — E1159 Type 2 diabetes mellitus with other circulatory complications: Secondary | ICD-10-CM | POA: Diagnosis not present

## 2021-10-04 LAB — POCT UA - MICROALBUMIN
Albumin/Creatinine Ratio, Urine, POC: <30
Creatinine, POC: 200 mg/dL
Microalbumin Ur, POC: 30 mg/L

## 2021-10-04 MED ORDER — SEMAGLUTIDE (1 MG/DOSE) 4 MG/3ML ~~LOC~~ SOPN: 1.0000 mg | PEN_INJECTOR | SUBCUTANEOUS | 3 refills | Status: DC

## 2021-10-04 NOTE — Patient Instructions (Signed)

## 2021-10-04 NOTE — Progress Notes (Signed)
? ?                                                            ?     10/04/2021, 3:37 PM ? ?   ?Endocrinology follow-up note ? ? ?Subjective:  ? ? Patient ID: Marvin Frost, male    DOB: 04/14/1946.  ? ?Marvin Frost is being seen in follow-up after he was seen in consultation for management of currently uncontrolled symptomatic diabetes. ?-He also has dyslipidemia, hypertension. ?PMD:   [name], MD. ? ? ?Past Medical History:  ?Diagnosis Date  ? Allergy   ? seasonal  ? Anemia   ? IDA  ? Arthritis   ? CAD (coronary artery disease)   ? Cancer [facility])   ? 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 Cataract   ? both removed  ? Cervical radiculopathy 09/14/2020  ? Diabetes mellitus 1995  ? Dysrhythmia   ? history of heart palpitations   ? Gastric polyps 09/18/2017  ? GERD (gastroesophageal reflux disease)   ? History of frequent urinary tract infections   ? History of hiatal hernia   ? Hyperlipemia   ? Hypertension   ? Obesity   ? Pancreatitis   ? ? ?Past Surgical History:  ?Procedure Laterality Date  ? CARDIAC CATHETERIZATION    ? 2004 and 1999- done by Dr Percival Spanish   ? COLONOSCOPY  11/2003  ? diverticulosis, no polyps  ? CORONARY STENT INTERVENTION N/A 05/28/2018  ? Procedure: CORONARY STENT INTERVENTION;  Surgeon: Sherren Mocha, MD;  Location: [facility];  Service: Cardiovascular;  Laterality: N/A;  ? EYE SURGERY Bilateral   ? cateracts - dr Katy Fitch  ? HYDROCELE EXCISION / REPAIR    ? INTRAVASCULAR PRESSURE WIRE/FFR STUDY N/A 05/28/2018  ? Procedure: INTRAVASCULAR PRESSURE WIRE/FFR STUDY;  Surgeon: Sherren Mocha, MD;  Location: [facility];  Service: Cardiovascular;  Laterality: N/A;  ? JOINT REPLACEMENT  2017  ? both hips  ? left calf surgery     ? related to trauma of being torn   ? LEFT HEART CATH AND CORONARY ANGIOGRAPHY N/A 05/28/2018  ? Procedure: LEFT HEART CATH AND CORONARY ANGIOGRAPHY;  Surgeon: Sherren Mocha, MD;  Location: [facility];  Service:  Cardiovascular;  Laterality: N/A;  ? left index finger arthrioscopic surgery     ? lymph node removed from right neck     ? right shoulder surgery    ? TOTAL HIP ARTHROPLASTY Right 03/16/2015  ? Procedure: RIGHT TOTAL HIP ARTHROPLASTY ANTERIOR APPROACH;  Surgeon: Paralee Cancel, MD;  Location: [facility];  Service: Orthopedics;  Laterality: Right;  ? TOTAL HIP ARTHROPLASTY Left 06/22/2015  ? Procedure: LEFT TOTAL HIP ARTHROPLASTY ANTERIOR APPROACH;  Surgeon: Paralee Cancel, MD;  Location: [facility];  Service: Orthopedics;  Laterality: Left;  ? UPPER GASTROINTESTINAL ENDOSCOPY    ? hiatal hernia 2cm, mild gastritis - 2012, multiple hyperplastic and fundic gland gastric polyps 2019  ? ? ?Social History  ? ?Socioeconomic History  ? Marital status: Married  ?  Spouse name: [name]  ? Number of children: 1  ? Years of education: Some College  ? Highest education level: Associate degree: occupational, Hotel manager, or vocational program  ?Occupational History  ? Occupation: MANAGER  ?  Employer:  [employer]  ?  Comment: retired  ?Tobacco Use  ? Smoking status: Former  ?  Packs/day: 1.00  ?  Years: 10.00  ?  Pack years: 10.00  ?  Types: Cigarettes  ?  Quit date: 04/06/1984  ?  Years since quitting: 37.5  ? Smokeless tobacco: Former  ?  Types: Chew  ?  Quit date: 06/26/1978  ? Tobacco comments:  ?  I do not smoke  ?Vaping Use  ? Vaping Use: Never used  ?Substance and Sexual Activity  ? Alcohol use: No  ? Drug use: No  ? Sexual activity: Yes  ?  Birth control/protection: None  ?Other Topics Concern  ? Not on file  ?Social History Narrative  ? Married  ? Retired from [employer]  ? 1 child  ? Former but no current tobacco  ? No EtOH/drugs  ? ?Social Determinants of Health  ? ?Financial Resource Strain: Low Risk   ? Difficulty of Paying Living Expenses: Not hard at all  ?Food Insecurity: No Food Insecurity  ? Worried About Charity fundraiser in the Last Year: Never true  ? Ran Out of Food in the Last Year: Never true  ?Transportation  Needs: No Transportation Needs  ? Lack of Transportation (Medical): No  ? Lack of Transportation (Non-Medical): No  ?Physical Activity: Insufficiently Active  ? Days of Exercise per Week: 4 days  ? Minutes of Exercise per Session: 30 min  ?Stress: No Stress Concern Present  ? Feeling of Stress : Not at all  ?Social Connections: Moderately Integrated  ? Frequency of Communication with Friends and Family: Once a week  ? Frequency of Social Gatherings with Friends and Family: Once a week  ? Attends Religious Services: More than 4 times per year  ? Active Member of Clubs or Organizations: Yes  ? Attends Archivist Meetings: More than 4 times per year  ? Marital Status: Married  ? ? ?Family History  ?Problem Relation Age of Onset  ? Heart disease Mother   ? Heart attack Mother   ? Diabetes Brother   ? Heart disease Brother   ? Heart attack Brother 29  ? Heart attack Father 72  ? Early death Father   ? Heart disease Father   ? GI problems Sister   ?     torn esophagus during endoscopy  ? Healthy Daughter   ? Colon polyps Neg Hx   ? Esophageal cancer Neg Hx   ? Rectal cancer Neg Hx   ? Stomach cancer Neg Hx   ? Colon cancer Neg Hx   ? ? ?Outpatient Encounter Medications as of 10/04/2021  ?Medication Sig  ? aspirin EC 81 MG EC tablet Take 1 tablet (81 mg total) by mouth daily.  ? Blood Glucose Monitoring Suppl (FREESTYLE LITE) DEVI Use to check BS bid. DX. E11.9  ? cetirizine (ZYRTEC) 10 MG tablet Take 10 mg by mouth as needed for allergies or rhinitis.   ? Continuous Blood Gluc Sensor (FREESTYLE LIBRE 14 DAY SENSOR) MISC Inject 1 each into the skin every 14 (fourteen) days. Use as directed.  ? Evolocumab (REPATHA SURECLICK) 865 MG/ML SOAJ Inject 140 mg into the skin every 14 (fourteen) days.  ? ferrous sulfate 325 (65 FE) MG tablet Take 1 tablet (325 mg total) by mouth every other day. (Patient taking differently: Take 325 mg by mouth every morning.)  ? fexofenadine (ALLEGRA) 180 MG tablet Take 1 tablet (180 mg  total) by mouth daily.  For allergy symptoms  ? fluocinolone (SYNALAR) 0.01 % external solution Apply topically 2 (two) times daily. To scalp  ? insulin degludec (TRESIBA FLEXTOUCH) 200 UNIT/ML FlexTouch Pen Inject 72 Units into the skin at bedtime.  ? Insulin Pen Needle (NOVOTWIST) 32G X 5 MM MISC USE WITH INSULIN PENS UP TO 5 TIMES DAILY Dx E11.59  ? Insulin Pen Needle 32G X 5 MM MISC by Does not apply route. Use with insulin pen needles up to 5 times daily  E11.9  ? Lancets (FREESTYLE) lancets Check BS BID and PRN. DX.E11.9  ? metFORMIN (GLUCOPHAGE-XR) 750 MG 24 hr tablet Take 1 tablet (750 mg total) by mouth daily with breakfast.  ? metoprolol succinate (TOPROL-XL) 100 MG 24 hr tablet TAKE 1 TABLET DAILY FOR BLOOD PRESSURE CONTROL  ? nitroGLYCERIN (NITROSTAT) 0.4 MG SL tablet DISSOLVE 1 TABLET UNDER THE TONGUE EVERY 5 MINUTES AS NEEDED FOR CHEST PAIN  ? olmesartan-hydrochlorothiazide (BENICAR HCT) 40-25 MG tablet Take 1 tablet by mouth daily.  ? pantoprazole (PROTONIX) 40 MG tablet Take 1 tablet (40 mg total) by mouth daily.  ? rosuvastatin (CRESTOR) 40 MG tablet Take 1 tablet (40 mg total) by mouth every 3 (three) days.  ? Semaglutide, 1 MG/DOSE, 4 MG/3ML SOPN Inject 1 mg as directed once a week.  ? terbinafine (LAMISIL) 1 % cream Apply 1 application topically 2 (two) times daily. (Patient taking differently: Apply 1 application. topically 2 (two) times daily. As needed)  ? [DISCONTINUED] Dulaglutide (TRULICITY) 4.5 WK/4.6KM SOPN Inject 4.5 mg into the skin once a week.  ? [DISCONTINUED] amoxicillin (AMOXIL) 500 MG capsule Take 4 capsules (2,000 mg total) by mouth See admin instructions. Take four capsules (2000 mg) by mouth one hour prior to dental appointments (Patient not taking: Reported on 07/28/2021)  ? [DISCONTINUED] SURE COMFORT PEN NEEDLES 32G X 6 MM MISC   ? ?No facility-administered encounter medications on file as of 10/04/2021.  ? ? ?ALLERGIES: ?Allergies  ?Allergen Reactions  ? Brilinta [Ticagrelor]    ?  Rash and SOB   ? Liraglutide Other (See Comments)  ?  Abdominal pain and enlarged pancreas on CT ? pancreatitis  ? Baxinets [Cetylpyridinium-Benzocaine] Other (See Comments)  ?  Unknown reaction  ? Celebrex Bethann Humble

## 2021-10-18 ENCOUNTER — Telehealth: Payer: Self-pay | Admitting: Family Medicine

## 2021-10-18 ENCOUNTER — Other Ambulatory Visit: Payer: Self-pay | Admitting: Family Medicine

## 2021-10-18 DIAGNOSIS — I10 Essential (primary) hypertension: Secondary | ICD-10-CM

## 2021-10-18 MED ORDER — METOPROLOL SUCCINATE ER 100 MG PO TB24: ORAL_TABLET | ORAL | 0 refills | Status: DC

## 2021-10-18 NOTE — Telephone Encounter (Signed)
Pt needs his [facility]rol[facility] refill [facility] [place] because he says his mail order pharmacy has messed up somewhere and never sent him his Metoprolol refill. Pt says he is completely out and needs to pick up refill today. ?

## 2021-10-18 NOTE — Telephone Encounter (Signed)
LM[facility]M [facility] ?

## 2021-11-11 ENCOUNTER — Telehealth: Payer: Self-pay | Admitting: Pharmacist

## 2021-11-11 NOTE — Telephone Encounter (Signed)
Please let patient and pharmacy know PA approved  You have to call on this [employer] PA every year [phone]  pt has ASCVD (tell them they do!) And mixed hyperlipidemia E78.2  Approved now until 11-11-22 PA case #[id number]

## 2021-11-16 ENCOUNTER — Ambulatory Visit (INDEPENDENT_AMBULATORY_CARE_PROVIDER_SITE_OTHER): Payer: Medicare Other | Admitting: Family Medicine

## 2021-11-16 ENCOUNTER — Encounter: Payer: Self-pay | Admitting: Family Medicine

## 2021-11-16 VITALS — BP 123/78 | HR 72 | Temp 97.7°F | Ht 68.0 in | Wt 200.4 lb

## 2021-11-16 DIAGNOSIS — I10 Essential (primary) hypertension: Secondary | ICD-10-CM

## 2021-11-16 DIAGNOSIS — E782 Mixed hyperlipidemia: Secondary | ICD-10-CM | POA: Diagnosis not present

## 2021-11-16 DIAGNOSIS — E1159 Type 2 diabetes mellitus with other circulatory complications: Secondary | ICD-10-CM

## 2021-11-16 LAB — CBC WITH DIFFERENTIAL/PLATELET
Basophils Absolute: 0.1 10*3/uL (ref 0.0–0.2)
Basos: 1 %
EOS (ABSOLUTE): 0.3 10*3/uL (ref 0.0–0.4)
Eos: 4 %
Hematocrit: 46.5 % (ref 37.5–51.0)
Hemoglobin: 15.4 g/dL (ref 13.0–17.7)
Immature Grans (Abs): 0 10*3/uL (ref 0.0–0.1)
Immature Granulocytes: 1 %
Lymphocytes Absolute: 1.3 10*3/uL (ref 0.7–3.1)
Lymphs: 18 %
MCH: 30.1 pg (ref 26.6–33.0)
MCHC: 33.1 g/dL (ref 31.5–35.7)
MCV: 91 fL (ref 79–97)
Monocytes Absolute: 0.7 10*3/uL (ref 0.1–0.9)
Monocytes: 10 %
Neutrophils Absolute: 4.9 10*3/uL (ref 1.4–7.0)
Neutrophils: 66 %
Platelets: 217 10*3/uL (ref 150–450)
RBC: 5.11 x10E6/uL (ref 4.14–5.80)
RDW: 13.5 % (ref 11.6–15.4)
WBC: 7.3 10*3/uL (ref 3.4–10.8)

## 2021-11-16 LAB — BAYER DCA HB A1C WAIVED: HB A1C (BAYER DCA - WAIVED): 7.7 % — ABNORMAL HIGH (ref 4.8–5.6)

## 2021-11-16 MED ORDER — TRESIBA FLEXTOUCH 200 UNIT/ML ~~LOC~~ SOPN: 64.0000 [IU] | PEN_INJECTOR | Freq: Every day | SUBCUTANEOUS | 5 refills | Status: DC

## 2021-11-16 MED ORDER — ROSUVASTATIN CALCIUM 40 MG PO TABS: 40.0000 mg | ORAL_TABLET | ORAL | 3 refills | Status: DC

## 2021-11-16 MED ORDER — AMOXICILLIN 500 MG PO CAPS: 2000.0000 mg | ORAL_CAPSULE | ORAL | 1 refills | Status: DC

## 2021-11-16 MED ORDER — OLMESARTAN MEDOXOMIL-HCTZ 40-25 MG PO TABS: 1.0000 | ORAL_TABLET | Freq: Every day | ORAL | 3 refills | Status: DC

## 2021-11-16 MED ORDER — METOPROLOL SUCCINATE ER 100 MG PO TB24: ORAL_TABLET | ORAL | 3 refills | Status: DC

## 2021-11-16 MED ORDER — PANTOPRAZOLE SODIUM 40 MG PO TBEC: 40.0000 mg | DELAYED_RELEASE_TABLET | Freq: Every day | ORAL | 3 refills | Status: DC

## 2021-11-16 NOTE — Progress Notes (Signed)
Subjective:  Patient ID: Marvin Frost,  male    DOB: 10-Jan-1946  Age: 76 y.o.    CC: Medical Management of Chronic Issues   HPI Marvin Frost presents for  follow-up of hypertension. Patient has no history of headache chest pain or shortness of breath or recent cough. Patient also denies symptoms of TIA such as numbness weakness lateralizing. Patient denies side effects from medication. States taking it regularly.  Patient also  in for follow-up of elevated cholesterol. Doing well without complaints on current medication. Denies side effects  including myalgia and arthralgia and nausea. Also in today for liver function testing. Currently no chest pain, shortness of breath or other cardiovascular related symptoms noted.  Follow-up of diabetes. Patient does check blood sugar at home. atient denies symptoms such as excessive hunger or urinary frequency, excessive hunger, nausea No significant hypoglycemic spells noted. Can run low in mornings if having a lot of exertion the day before. Has gone as low as 65 fasting. High end is 144. Medications reviewed. Pt reports taking them regularly. Pt. denies complication/adverse reaction today.    History Marvin Frost has a past medical history of Allergy, Anemia, Arthritis, CAD (coronary artery disease), Cancer ([place]), Cataract, Cervical radiculopathy (09/14/2020), Diabetes mellitus (1995), Dysrhythmia, Gastric polyps (09/18/2017), GERD (gastroesophageal reflux disease), History of frequent urinary tract infections, History of hiatal hernia, Hyperlipemia, Hypertension, Obesity, and Pancreatitis.   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   His family history includes Diabetes in his brother; Early death in his father; GI problems in his sister; Healthy in his daughter; Heart attack in his mother; Heart attack (age of onset: 32) in his father; Heart attack (age of onset: 13) in his brother; Heart disease in his brother, father, and mother.He reports that he quit smoking about 37 years ago. His smoking use included cigarettes. He has a 10.00 pack-year smoking history. He quit smokeless tobacco use about 43 years ago.  His smokeless tobacco use included chew. He reports that he does not drink alcohol and does not use drugs.  Current Outpatient Medications on File Prior to Visit  Medication Sig Dispense Refill   aspirin EC 81 MG EC tablet Take 1 tablet (81 mg total) by mouth daily.     Blood Glucose Monitoring Suppl (FREESTYLE LITE) DEVI Use to check BS bid. DX. E11.9 1 each 0   cetirizine (ZYRTEC) 10 MG tablet Take 10 mg by mouth as needed for allergies or rhinitis.      Continuous Blood Gluc Sensor (FREESTYLE LIBRE 14 DAY SENSOR) MISC Inject 1 each into the skin every 14 (fourteen) days. Use as directed. 6 each 2   Evolocumab (REPATHA SURECLICK) 169 MG/ML SOAJ Inject 140 mg into the skin every 14 (fourteen) days. 6 mL 4   ferrous sulfate 325 (65 FE) MG tablet Take 1 tablet (325 mg total) by mouth every other day. (Patient taking differently: Take 325 mg by mouth every morning.) 90 tablet 1   fexofenadine (ALLEGRA) 180 MG tablet Take 1 tablet (180 mg total) by mouth daily. For allergy symptoms 30 tablet 11   fluocinolone (  SYNALAR) 0.01 % external solution Apply topically 2 (two) times daily. To scalp 60 mL 5   Insulin Pen Needle (NOVOTWIST) 32G X 5 MM MISC USE WITH INSULIN PENS UP TO 5 TIMES DAILY Dx E11.59 500 each 4    Insulin Pen Needle 32G X 5 MM MISC by Does not apply route. Use with insulin pen needles up to 5 times daily  E11.9     Lancets (FREESTYLE) lancets Check BS BID and PRN. DX.E11.9 300 each 3   metFORMIN (GLUCOPHAGE-XR) 750 MG 24 hr tablet Take 1 tablet (750 mg total) by mouth daily with breakfast. 180 tablet 3   nitroGLYCERIN (NITROSTAT) 0.4 MG SL tablet DISSOLVE 1 TABLET UNDER THE TONGUE EVERY 5 MINUTES AS NEEDED FOR CHEST PAIN 25 tablet 11   Semaglutide, 1 MG/DOSE, 4 MG/3ML SOPN Inject 1 mg as directed once a week. 6 mL 3   terbinafine (LAMISIL) 1 % cream Apply 1 application topically 2 (two) times daily. (Patient taking differently: Apply 1 application. topically 2 (two) times daily. As needed) 30 g 0   No current facility-administered medications on file prior to visit.    ROS Review of Systems  Constitutional:  Negative for fever.  Respiratory:  Negative for shortness of breath.   Cardiovascular:  Negative for chest pain.  Musculoskeletal:  Negative for arthralgias.  Skin:  Negative for rash.   Objective:  BP 123/78   Pulse 72   Temp 97.7 F (36.5 C)   Ht _0  (1.727 m)   Wt 200 lb 6.4 oz (90.9 kg)   SpO2 95%   BMI 30.47 kg/m   BP Readings from Last 3 Encounters:  11/16/21 123/78  10/04/21 107/66  08/18/21 124/73    Wt Readings from Last 3 Encounters:  11/16/21 200 lb 6.4 oz (90.9 kg)  10/04/21 203 lb 9.6 oz (92.4 kg)  08/18/21 203 lb 12.8 oz (92.4 kg)     Physical Exam Vitals reviewed.  Constitutional:      Appearance: He is well-developed.  HENT:     Head: Normocephalic and atraumatic.     Right Ear: External ear normal.     Left Ear: External ear normal.     Mouth/Throat:     Pharynx: No oropharyngeal exudate or posterior oropharyngeal erythema.  Eyes:     Pupils: Pupils are equal, round, and reactive to light.  Cardiovascular:     Rate and Rhythm: Normal rate and regular rhythm.     Heart sounds: No murmur heard. Pulmonary:     Effort: No respiratory  distress.     Breath sounds: Normal breath sounds.  Musculoskeletal:     Cervical back: Normal range of motion and neck supple.  Neurological:     Mental Status: He is alert and oriented to person, place, and time.    Diabetic Foot Exam - Simple   No data filed     Lab Results  Component Value Date   HGBA1C 7.8 (H) 08/18/2021   HGBA1C 8.2 (H) 05/12/2021   HGBA1C 7.9 (H) 02/08/2021    Assessment & Plan:   Marvin Frost was seen today for medical management of chronic issues.  Diagnoses and all orders for this visit:  Essential hypertension, benign -     CBC with Differential/Platelet -     CMP14+EGFR -     metoprolol succinate (TOPROL-XL) 100 MG 24 hr tablet; TAKE 1 TABLET DAILY FOR BLOOD PRESSURE CONTROL  Mixed hyperlipidemia -     Lipid panel -  rosuvastatin (CRESTOR) 40 MG tablet; Take 1 tablet (40 mg total) by mouth every 3 (three) days.  DM type 2 causing vascular disease (HCC) -     Bayer DCA Hb A1c Waived -     insulin degludec (TRESIBA FLEXTOUCH) 200 UNIT/ML FlexTouch Pen; Inject 64 Units into the skin at bedtime.  Other orders -     olmesartan-hydrochlorothiazide (BENICAR HCT) 40-25 MG tablet; Take 1 tablet by mouth daily. -     pantoprazole (PROTONIX) 40 MG tablet; Take 1 tablet (40 mg total) by mouth daily. -     amoxicillin (AMOXIL) 500 MG capsule; Take 4 capsules (2,000 mg total) by mouth See admin instructions. Take four capsules (2000 mg) by mouth one hour prior to dental appointments   I have changed Marvin Frost Radi's pantoprazole, amoxicillin, and [employer]. I am also having him maintain his FreeStyle Lite, cetirizine, freestyle, aspirin EC, ferrous sulfate, FreeStyle Libre 14 Day Sensor, terbinafine, nitroGLYCERIN, Repatha SureClick, Insulin Pen Needle, metFORMIN, Insulin Pen Needle, fluocinolone, fexofenadine, Semaglutide (1 MG/DOSE), rosuvastatin, olmesartan-hydrochlorothiazide, and metoprolol succinate.  Meds ordered this encounter  Medications    rosuvastatin (CRESTOR) 40 MG tablet    Sig: Take 1 tablet (40 mg total) by mouth every 3 (three) days.    Dispense:  90 tablet    Refill:  3   olmesartan-hydrochlorothiazide (BENICAR HCT) 40-25 MG tablet    Sig: Take 1 tablet by mouth daily.    Dispense:  90 tablet    Refill:  3   pantoprazole (PROTONIX) 40 MG tablet    Sig: Take 1 tablet (40 mg total) by mouth daily.    Dispense:  90 tablet    Refill:  3   metoprolol succinate (TOPROL-XL) 100 MG 24 hr tablet    Sig: TAKE 1 TABLET DAILY FOR BLOOD PRESSURE CONTROL    Dispense:  90 tablet    Refill:  3   amoxicillin (AMOXIL) 500 MG capsule    Sig: Take 4 capsules (2,000 mg total) by mouth See admin instructions. Take four capsules (2000 mg) by mouth one hour prior to dental appointments    Dispense:  4 capsule    Refill:  1   insulin degludec (TRESIBA FLEXTOUCH) 200 UNIT/ML FlexTouch Pen    Sig: Inject 64 Units into the skin at bedtime.    Dispense:  30 mL    Refill:  5     Follow-up: Return in about 6 months (around 05/19/2022).  [name], M.D.

## 2021-11-17 NOTE — Progress Notes (Signed)
Hello Marvin Frost,  Your lab result is normal and/or stable.Some minor variations that are not significant are commonly marked abnormal, but do not represent any medical problem for you.  Best regards, Jarae Panas, M.D.

## 2021-12-08 ENCOUNTER — Telehealth: Payer: Self-pay | Admitting: Nurse Practitioner

## 2021-12-08 NOTE — Telephone Encounter (Signed)
Pt left a voicemail stating that he has to use [facility] and they now have [place] 0.'5mg'$  back in stock, [facility] had him on Ozempic for the time being and he states that is too expensive for him. He would like to know if he can be switched back to [place] and if so it needs to go to [facility]. Pt requesting a call back [phone]

## 2021-12-08 NOTE — Telephone Encounter (Signed)
This pt is wanting Trulicity instead of Ozempic due to cost. Not Tresiba. Unsure of the dosage. I see '3mg'$  and 4.5 mg in last note.

## 2021-12-09 MED ORDER — DULAGLUTIDE 4.5 MG/0.5ML ~~LOC~~ SOAJ: 4.5000 mg | SUBCUTANEOUS | 0 refills | Status: DC

## 2021-12-26 DIAGNOSIS — M25521 Pain in right elbow: Secondary | ICD-10-CM | POA: Diagnosis not present

## 2022-01-03 ENCOUNTER — Ambulatory Visit: Payer: Medicare Other | Admitting: Nurse Practitioner

## 2022-01-12 ENCOUNTER — Ambulatory Visit (INDEPENDENT_AMBULATORY_CARE_PROVIDER_SITE_OTHER): Payer: Medicare Other | Admitting: Nurse Practitioner

## 2022-01-12 ENCOUNTER — Encounter: Payer: Self-pay | Admitting: Nurse Practitioner

## 2022-01-12 VITALS — BP 110/73 | HR 77 | Ht 68.0 in | Wt 197.0 lb

## 2022-01-12 DIAGNOSIS — E782 Mixed hyperlipidemia: Secondary | ICD-10-CM

## 2022-01-12 DIAGNOSIS — I10 Essential (primary) hypertension: Secondary | ICD-10-CM | POA: Diagnosis not present

## 2022-01-12 DIAGNOSIS — E1159 Type 2 diabetes mellitus with other circulatory complications: Secondary | ICD-10-CM

## 2022-01-12 NOTE — Progress Notes (Signed)
01/12/2022, 4:13 PM     Endocrinology follow-up note   Subjective:    Patient ID: Marvin Frost, male    DOB: January 10, 1946.   Marvin Frost is being seen in follow-up after he was seen in consultation for management of currently uncontrolled symptomatic diabetes. -He also has dyslipidemia, hypertension. PMD:   [name], MD.   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    Cataract    both removed   Cervical radiculopathy 09/14/2020   Diabetes mellitus 1995   Dysrhythmia    history of heart palpitations    Gastric polyps 09/18/2017   GERD (gastroesophageal reflux disease)    History of frequent urinary tract infections    History of hiatal hernia    Hyperlipemia    Hypertension    Obesity    Pancreatitis     Past Surgical History:  Procedure Laterality Date   CARDIAC CATHETERIZATION     2004 and 1999- done by Dr Percival Spanish    COLONOSCOPY  11/2003   diverticulosis, no polyps   CORONARY STENT INTERVENTION N/A 05/28/2018   Procedure: CORONARY STENT INTERVENTION;  Surgeon: Sherren Mocha, MD;  Location: [facility];  Service: Cardiovascular;  Laterality: N/A;   EYE SURGERY Bilateral    cateracts - dr Katy Fitch   HYDROCELE EXCISION / REPAIR     INTRAVASCULAR PRESSURE WIRE/FFR STUDY N/A 05/28/2018   Procedure: INTRAVASCULAR PRESSURE WIRE/FFR STUDY;  Surgeon: Sherren Mocha, MD;  Location: [facility];  Service: Cardiovascular;  Laterality: N/A;   JOINT REPLACEMENT  2017   both hips   left calf surgery      related to trauma of being torn    LEFT HEART CATH AND CORONARY ANGIOGRAPHY N/A 05/28/2018   Procedure: LEFT HEART CATH AND CORONARY ANGIOGRAPHY;  Surgeon: Sherren Mocha, MD;  Location: [facility];  Service:  Cardiovascular;  Laterality: N/A;   left index finger arthrioscopic surgery      lymph node removed from right neck      right shoulder surgery     TOTAL HIP ARTHROPLASTY Right 03/16/2015   Procedure: RIGHT TOTAL HIP ARTHROPLASTY ANTERIOR APPROACH;  Surgeon: Paralee Cancel, MD;  Location: [facility];  Service: Orthopedics;  Laterality: Right;   TOTAL HIP ARTHROPLASTY Left 06/22/2015   Procedure: LEFT TOTAL HIP ARTHROPLASTY ANTERIOR APPROACH;  Surgeon: Paralee Cancel, MD;  Location: [facility];  Service: Orthopedics;  Laterality: Left;   UPPER GASTROINTESTINAL ENDOSCOPY     hiatal hernia 2cm, mild gastritis - 2012, multiple hyperplastic and fundic gland gastric polyps 2019    Social History   Socioeconomic History   Marital status: Married    Spouse name: [name]   Number of children: 1   Years of education: Some College   Highest education level: Associate degree: occupational, Hotel manager, or vocational program  Occupational History   Occupation: Best boy:  [employer]    Comment: retired  Tobacco Use   Smoking status: Former    Packs/day: 1.00    Years: 10.00    Total pack years: 10.00    Types: Cigarettes    Quit date: 04/06/1984    Years since quitting: 37.7   Smokeless tobacco: Former    Types: Chew    Quit date: 06/26/1978   Tobacco comments:    I do not smoke  Vaping Use   Vaping Use: Never used  Substance and Sexual Activity   Alcohol use: No   Drug use: No   Sexual activity: Yes    Birth control/protection: None  Other Topics Concern   Not on file  Social History Narrative   Married   Retired from [employer]   1 child   Former but no current tobacco   No EtOH/drugs   Social Determinants of Radio broadcast assistant Strain: Low Risk  (07/28/2021)   Overall Financial Resource Strain (CARDIA)    Difficulty of Paying Living Expenses: Not hard at all  Food Insecurity: No Food Insecurity (07/28/2021)   Hunger Vital Sign    Worried About Running Out of  Food in the Last Year: Never true    [place] in the Last Year: Never true  Transportation Needs: No Transportation Needs (07/28/2021)   PRAPARE - Hydrologist (Medical): No    Lack of Transportation (Non-Medical): No  Physical Activity: Insufficiently Active (07/28/2021)   Exercise Vital Sign    Days of Exercise per Week: 4 days    Minutes of Exercise per Session: 30 min  Stress: No Stress Concern Present (07/28/2021)   [place]    Feeling of Stress : Not at all  Social Connections: Moderately Integrated (07/28/2021)   Social Connection and Isolation Panel [NHANES]    Frequency of Communication with Friends and Family: Once a week    Frequency of Social Gatherings with Friends and Family: Once a week    Attends Religious Services: More than 4 times per year    Active Member of [employer] or Organizations: Yes    Attends Music therapist: More than 4 times per year    Marital Status: Married    Family History  Problem Relation Age of Onset   Heart disease Mother    Heart attack Mother    Diabetes Brother    Heart disease Brother    Heart attack Brother 53   Heart attack Father 60   Early death Father    Heart disease Father    GI problems Sister        torn esophagus during endoscopy   Healthy Daughter    Colon polyps Neg Hx    Esophageal cancer Neg Hx    Rectal cancer Neg Hx    Stomach cancer Neg Hx    Colon cancer Neg Hx     Outpatient Encounter Medications as of 01/12/2022  Medication Sig   amoxicillin (AMOXIL) 500 MG capsule Take 4 capsules (2,000 mg total) by mouth See admin instructions. Take four capsules (2000 mg) by mouth one hour prior to dental appointments   aspirin EC 81 MG EC tablet Take 1 tablet (81 mg total) by mouth daily.   Blood Glucose Monitoring Suppl (FREESTYLE LITE) DEVI Use to check BS bid. DX. E11.9   cetirizine (ZYRTEC) 10 MG tablet Take 10 mg  by mouth as needed for  allergies or rhinitis.    Continuous Blood Gluc Sensor (FREESTYLE LIBRE 14 DAY SENSOR) MISC Inject 1 each into the skin every 14 (fourteen) days. Use as directed.   Dulaglutide 4.5 MG/0.5ML SOPN Inject 4.5 mg into the skin once a week.   Evolocumab (REPATHA SURECLICK) 517 MG/ML SOAJ Inject 140 mg into the skin every 14 (fourteen) days.   ferrous sulfate 325 (65 FE) MG tablet Take 1 tablet (325 mg total) by mouth every other day. (Patient taking differently: Take 325 mg by mouth every morning.)   fexofenadine (ALLEGRA) 180 MG tablet Take 1 tablet (180 mg total) by mouth daily. For allergy symptoms   fluocinolone (SYNALAR) 0.01 % external solution Apply topically 2 (two) times daily. To scalp   insulin degludec (TRESIBA FLEXTOUCH) 200 UNIT/ML FlexTouch Pen Inject 64 Units into the skin at bedtime.   Insulin Pen Needle (NOVOTWIST) 32G X 5 MM MISC USE WITH INSULIN PENS UP TO 5 TIMES DAILY Dx E11.59   Insulin Pen Needle 32G X 5 MM MISC by Does not apply route. Use with insulin pen needles up to 5 times daily  E11.9   Lancets (FREESTYLE) lancets Check BS BID and PRN. DX.E11.9   metFORMIN (GLUCOPHAGE-XR) 750 MG 24 hr tablet Take 1 tablet (750 mg total) by mouth daily with breakfast.   metoprolol succinate (TOPROL-XL) 100 MG 24 hr tablet TAKE 1 TABLET DAILY FOR BLOOD PRESSURE CONTROL   nitroGLYCERIN (NITROSTAT) 0.4 MG SL tablet DISSOLVE 1 TABLET UNDER THE TONGUE EVERY 5 MINUTES AS NEEDED FOR CHEST PAIN   olmesartan-hydrochlorothiazide (BENICAR HCT) 40-25 MG tablet Take 1 tablet by mouth daily.   pantoprazole (PROTONIX) 40 MG tablet Take 1 tablet (40 mg total) by mouth daily.   rosuvastatin (CRESTOR) 40 MG tablet Take 1 tablet (40 mg total) by mouth every 3 (three) days.   terbinafine (LAMISIL) 1 % cream Apply 1 application topically 2 (two) times daily. (Patient taking differently: Apply 1 application. topically 2 (two) times daily. As needed)   No facility-administered encounter  medications on file as of 01/12/2022.    ALLERGIES: Allergies  Allergen Reactions   Brilinta [Ticagrelor]     Rash and SOB    Liraglutide Other (See Comments)    Abdominal pain and enlarged pancreas on CT ? pancreatitis   Baxinets [Cetylpyridinium-Benzocaine] Other (See Comments)    Unknown reaction   Celebrex [Celecoxib] Nausea Only   Toprol Xl [Metoprolol Succinate] Other (See Comments)    Unknown reaction - pt states it did not work   Triplex Ad Other (See Comments)    Abdominal pain and enlarged pancreas on CT? pancreatitis   Welchol [Colesevelam Hcl] Other (See Comments)    constipation   Norvasc [Amlodipine Besylate] Rash   Statins Other (See Comments)    muscle and joint pain if taken for long periods of time, takes Crestor every 2-3 days to avoid this reaction    VACCINATION STATUS: Immunization History  Administered Date(s) Administered   Fluad Quad(high Dose 65+) 03/26/2019, 04/19/2020, 05/12/2021   Influenza Whole 02/24/2010   Influenza, High Dose Seasonal PF 04/15/2015, 04/12/2016, 04/05/2017, 04/16/2018   Influenza,inj,Quad PF,6+ Mos 03/26/2013, 03/30/2014   Moderna Sars-Covid-2 Vaccination 07/31/2019, 08/28/2019, 03/15/2020   Pneumococcal Conjugate-13 06/17/2013   Pneumococcal Polysaccharide-23 02/24/2009   Td 02/24/2006   Tdap 04/27/2011   Zoster Recombinat (Shingrix) 07/22/2018   Zoster, Live 07/27/2006    Diabetes He presents for his follow-up diabetic visit. He has type 2 diabetes mellitus. Onset time: He was diagnosed at approximate  age of 70 years. His disease course has been stable. There are no hypoglycemic associated symptoms. Pertinent negatives for hypoglycemia include no confusion, headaches, pallor or seizures. Pertinent negatives for diabetes include no chest pain, no fatigue, no polydipsia, no polyphagia, no polyuria and no weakness. There are no hypoglycemic complications. Symptoms are stable. Diabetic complications include heart disease and  nephropathy. Risk factors for coronary artery disease include diabetes mellitus, dyslipidemia, family history, male sex, hypertension, sedentary lifestyle, tobacco exposure and obesity. Current diabetic treatment includes oral agent (monotherapy) and insulin injections (Trulicity). He is compliant with treatment most of the time. His weight is decreasing steadily. He is following a generally healthy diet. When asked about meal planning, he reported none. He has not had a previous visit with a dietitian. He participates in exercise intermittently. His home blood glucose trend is fluctuating minimally. His breakfast blood glucose range is generally 140-180 mg/dl. His lunch blood glucose range is generally 140-180 mg/dl. His dinner blood glucose range is generally 140-180 mg/dl. His bedtime blood glucose range is generally 180-200 mg/dl. (He presents today with his logs, no meter (forgot his CGM at home), showing slightly above target glycemic profile.  His previsit A1c, checked at his PCP was 7.7% on 5/24, essentially unchanged from previous visit.  He denies any significant hypoglycemia.  We did switch him back to Trulicity from Ozempic due to diarrhea and medication cost.) An ACE inhibitor/angiotensin II receptor blocker is being taken. He does not see a podiatrist.Eye exam is current.  Hyperlipidemia This is a chronic problem. The current episode started more than 1 year ago. The problem is controlled. Recent lipid tests were reviewed and are normal. Exacerbating diseases include chronic renal disease, diabetes and obesity. Factors aggravating his hyperlipidemia include beta blockers. Pertinent negatives include no chest pain, myalgias or shortness of breath. Current antihyperlipidemic treatment includes statins (Repatha every 2 weeks. Only takes his statin once every few days dt myalgias). The current treatment provides moderate improvement of lipids. Compliance problems include medication side effects.  Risk  factors for coronary artery disease include male sex, obesity, family history, dyslipidemia, diabetes mellitus, hypertension and a sedentary lifestyle.  Hypertension This is a chronic problem. The current episode started more than 1 year ago. The problem has been gradually improving since onset. The problem is controlled. Pertinent negatives include no chest pain, headaches, neck pain, palpitations or shortness of breath. There are no associated agents to hypertension. Risk factors for coronary artery disease include dyslipidemia, diabetes mellitus, family history, male gender, obesity, sedentary lifestyle and smoking/tobacco exposure. Past treatments include angiotensin blockers, diuretics and beta blockers. The current treatment provides moderate improvement. There are no compliance problems.  Hypertensive end-organ damage includes CAD/MI. Identifiable causes of hypertension include chronic renal disease.    Review of systems  Constitutional: + steadily decreasing body weight,  current Body mass index is 29.95 kg/m. , no fatigue, no subjective hyperthermia, no subjective hypothermia Eyes: no blurry vision, no xerophthalmia ENT: no sore throat, no nodules palpated in throat, no dysphagia/odynophagia, no hoarseness Cardiovascular: no chest pain, no shortness of breath, no palpitations, no leg swelling Respiratory: no cough, no shortness of breath Gastrointestinal: no nausea/vomiting/diarrhea Musculoskeletal: no muscle/joint aches Skin: no rashes, no hyperemia Neurological: no tremors, no numbness, no tingling, no dizziness Psychiatric: no depression, no anxiety   Objective:    BP 110/73   Pulse 77   Ht '5\' 8"'$  (1.727 m)   Wt 197 lb (89.4 kg)   BMI 29.95 kg/m   Wt  Readings from Last 3 Encounters:  01/12/22 197 lb (89.4 kg)  11/16/21 200 lb 6.4 oz (90.9 kg)  10/04/21 203 lb 9.6 oz (92.4 kg)    BP Readings from Last 3 Encounters:  01/12/22 110/73  11/16/21 123/78  10/04/21 107/66     Physical Exam- Limited  Constitutional:  Body mass index is 29.95 kg/m. , not in acute distress, normal state of mind Eyes:  EOMI, no exophthalmos Neck: Supple Cardiovascular: RRR, no murmurs, rubs, or gallops, no edema Respiratory: Adequate breathing efforts, no crackles, rales, rhonchi, or wheezing Musculoskeletal: no gross deformities, strength intact in all four extremities, no gross restriction of joint movements Skin:  no rashes, no hyperemia Neurological: no tremor with outstretched hands   Diabetic Foot Exam - Simple   No data filed    CMP     Component Value Date/Time   NA 138 08/18/2021 0925   K 4.6 08/18/2021 0925   CL 99 08/18/2021 0925   CO2 25 08/18/2021 0925   GLUCOSE 155 (H) 08/18/2021 0925   GLUCOSE 154 (H) 05/29/2018 0217   BUN 11 08/18/2021 0925   CREATININE 1.05 08/18/2021 0925   CREATININE 0.95 12/31/2012 0857   CALCIUM 9.3 08/18/2021 0925   PROT 6.4 08/18/2021 0925   ALBUMIN 4.0 08/18/2021 0925   AST 17 08/18/2021 0925   ALT 18 08/18/2021 0925   ALKPHOS 60 08/18/2021 0925   BILITOT 0.3 08/18/2021 0925   GFRNONAA 68 07/07/2020 1028   GFRNONAA 81 12/31/2012 0857   GFRAA 78 07/07/2020 1028   GFRAA >89 12/31/2012 0857    Diabetic Labs (most recent): Lab Results  Component Value Date   HGBA1C 7.7 (H) 11/16/2021   HGBA1C 7.8 (H) 08/18/2021   HGBA1C 8.2 (H) 05/12/2021   MICROALBUR 30 10/04/2021   MICROALBUR 10 12/30/2020   MICROALBUR 20 01/26/2015     Lipid Panel ( most recent) Lipid Panel     Component Value Date/Time   CHOL 112 08/18/2021 0925   CHOL 157 12/31/2012 0857   TRIG 272 (H) 08/18/2021 0925   TRIG 271 (H) 02/17/2016 0824   TRIG 184 (H) 12/31/2012 0857   HDL 35 (L) 08/18/2021 0925   HDL 44 02/17/2016 0824   HDL 45 12/31/2012 0857   CHOLHDL 3.2 08/18/2021 0925   LDLCALC 36 08/18/2021 0925   LDLCALC 52 01/19/2014 1007   LDLCALC 75 12/31/2012 0857   LDLDIRECT 39 11/02/2016 0806      Lab Results  Component Value  Date   TSH 3.350 07/07/2020   TSH 2.720 12/30/2019   TSH 2.290 09/03/2014   FREET4 1.42 07/07/2020     Assessment & Plan:   1) DM type 2 causing vascular disease ([facility])  -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  - Marvin Frost has currently uncontrolled symptomatic type 2 DM since  76 years of age.  He presents today with his logs, no meter (forgot his CGM at home), showing slightly above target glycemic profile.  His previsit A1c, checked at his PCP was 7.7% on 5/24, essentially unchanged from previous visit.  He denies any significant hypoglycemia.  We did switch him back to Trulicity from Ozempic due to diarrhea and medication cost.  -I have reviewed his recent labs with him.  - I had a long discussion with him about the progressive nature of diabetes and the pathology behind its complications. -his diabetes is complicated by coronary artery disease, obesity and he remains at a high risk for  more acute and chronic complications which include CAD, CVA, CKD, retinopathy, and neuropathy. These are all discussed in detail with him.  The following Lifestyle Medicine recommendations according to [facility]) were discussed and offered to patient and he agrees to start the journey:  A. Whole Foods, Plant-based plate comprising of fruits and vegetables, plant-based proteins, whole-grain carbohydrates was discussed in detail with the patient.   A list for source of those nutrients were also provided to the patient.  Patient will use only water or unsweetened tea for hydration. B.  The need to stay away from risky substances including alcohol, smoking; obtaining 7 to 9 hours of restorative sleep, at least 150 minutes of moderate intensity exercise weekly, the importance of healthy social connections,  and stress reduction techniques were discussed. C.  A full color page of  Calorie density of various food groups per pound showing  examples of each food groups was provided to the patient.  - Nutritional counseling repeated at each appointment due to patients tendency to fall back in to old habits.  - The patient admits there is a room for improvement in their diet and drink choices. -  Suggestion is made for the patient to avoid simple carbohydrates from their diet including Cakes, Sweet Desserts / Pastries, Ice Cream, Soda (diet and regular), Sweet Tea, Candies, Chips, Cookies, Sweet Pastries, Store Bought Juices, Alcohol in Excess of 1-2 drinks a day, Artificial Sweeteners, Coffee Creamer, and "Sugar-free" Products. This will help patient to have stable blood glucose profile and potentially avoid unintended weight gain.   - I encouraged the patient to switch to unprocessed or minimally processed complex starch and increased protein intake (animal or plant source), fruits, and vegetables.   - Patient is advised to stick to a routine mealtimes to eat 3 meals a day and avoid unnecessary snacks (to snack only to correct hypoglycemia).  - I have approached him with the following individualized plan to manage diabetes and patient agrees:   -He is advised to continue Tresiba 64 units SQ nightly Metformin 750 mg ER twice daily with meals, and Trulicity 4.5 mg SQ weekly.  -He is encouraged to continue monitoring blood glucose using his CGM at least 4 times per day, before meals and at bedtime and call the clinic if he gets readings less than 70 or greater than 200 for 3 tests in a row.  - Patient specific target  A1c;  LDL, HDL, Triglycerides,  were discussed in detail.  2) Blood Pressure /Hypertension:   His blood pressure is controlled to target.  He is advised to continue Metoprolol 100 mg po daily and Benicar HCT 40-25 mg po daily  3) Lipids/Hyperlipidemia: His most recent lipid panel from 08/18/21 shows controlled LDL of 36 and elevated triglycerides of 272 . He is statin intolerant with myalgias.  He has tolerated taking  his Crestor every 2-3 days.  He is advised to continue taking his Crestor 40 mg po every 3 days or so and continue his Repatha 140 mg SQ every 2 weeks.  Side effects and precautions discussed with him.    4)  Weight/Diet:  His Body mass index is 29.95 kg/m.--   He has lost some weight over the last few visits and will benefit from continued weight loss.  I discussed with him the fact that loss of 5 - 10% of his  current body weight will have the most impact on his diabetes management.  CDE Consult will be  initiated . Exercise, and detailed carbohydrates information provided  -  detailed on discharge instructions.  5) Chronic Care/Health Maintenance: -he is on ACEI/ARB and Statin medications and is encouraged to initiate and continue to follow up with Ophthalmology, Dentist, Podiatrist at least yearly or according to recommendations, and advised to  stay away from smoking. I have recommended yearly flu vaccine and pneumonia vaccine at least every 5 years; moderate intensity exercise for up to 150 minutes weekly; and  sleep for at least 7 hours a day.  - he is advised to maintain close follow up with [name], MD for primary care needs, as well as his other providers for optimal and coordinated care.    I spent 40 minutes in the care of the patient today including review of labs from [facility], Lipids, Thyroid Function, Hematology (current and previous including abstractions from other facilities); face-to-face time discussing  his blood glucose readings/logs, discussing hypoglycemia and hyperglycemia episodes and symptoms, medications doses, his options of short and long term treatment based on the latest standards of care / guidelines;  discussion about incorporating lifestyle medicine;  and documenting the encounter. Risk reduction counseling performed per USPSTF guidelines to reduce obesity and cardiovascular risk factors.     Please refer to Patient Instructions for Blood Glucose Monitoring and  Insulin/Medications Dosing Guide"  in media tab for additional information. Please  also refer to " Patient Self Inventory" in the Media  tab for reviewed elements of pertinent patient history.  Marvin Frost participated in the discussions, expressed understanding, and voiced agreement with the above plans.  All questions were answered to his satisfaction. he is encouraged to contact clinic should he have any questions or concerns prior to his return visit.    Follow up plan: - Return in about 4 months (around 05/15/2022) for Diabetes F/U with A1c in office, Bring meter and logs.    Rayetta Pigg, [facility] [address] [place], [place] [address] Phone: [phone] Fax: [phone]  01/12/2022, 4:13 PM

## 2022-02-14 ENCOUNTER — Other Ambulatory Visit: Payer: Self-pay | Admitting: Nurse Practitioner

## 2022-02-14 ENCOUNTER — Other Ambulatory Visit: Payer: Self-pay | Admitting: Family Medicine

## 2022-02-14 DIAGNOSIS — E1169 Type 2 diabetes mellitus with other specified complication: Secondary | ICD-10-CM

## 2022-02-14 DIAGNOSIS — E1159 Type 2 diabetes mellitus with other circulatory complications: Secondary | ICD-10-CM

## 2022-02-14 DIAGNOSIS — Z8679 Personal history of other diseases of the circulatory system: Secondary | ICD-10-CM

## 2022-03-30 ENCOUNTER — Ambulatory Visit (INDEPENDENT_AMBULATORY_CARE_PROVIDER_SITE_OTHER): Payer: Medicare Other

## 2022-03-30 DIAGNOSIS — Z23 Encounter for immunization: Secondary | ICD-10-CM

## 2022-04-02 NOTE — Progress Notes (Unsigned)
Cardiology Office Note   Date:  04/03/2022   ID:  Marvin Frost, DOB 10-28-1945, MRN [id number]  PCP:  [name], MD  Cardiologist:   Minus Breeding, MD   Chief Complaint  Patient presents with   Coronary Artery Disease       History of Present Illness: Marvin Frost is a 76 y.o. male who presents for follow up of CAD.  He had stenting of his mid LAD in December of 2019.   Since I last saw him he stays active in the yard. The patient denies any new symptoms such as chest discomfort, neck or arm discomfort. There has been no new shortness of breath, PND or orthopnea. There have been no reported palpitations, presyncope or syncope.    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    Cataract    both removed   Cervical radiculopathy 09/14/2020   Diabetes mellitus 1995   Dysrhythmia    history of heart palpitations    Gastric polyps 09/18/2017   GERD (gastroesophageal reflux disease)    History of frequent urinary tract infections    History of hiatal hernia    Hyperlipemia    Hypertension    Obesity    Pancreatitis     Past Surgical History:  Procedure Laterality Date   CARDIAC CATHETERIZATION     2004 and 1999- done by Dr Percival Spanish    COLONOSCOPY  11/2003   diverticulosis, no polyps   CORONARY STENT INTERVENTION N/A 05/28/2018   Procedure: CORONARY STENT INTERVENTION;  Surgeon: Sherren Mocha, MD;  Location: [facility];  Service: Cardiovascular;  Laterality: N/A;   EYE SURGERY Bilateral    cateracts - dr Katy Fitch   HYDROCELE EXCISION / REPAIR     INTRAVASCULAR PRESSURE WIRE/FFR STUDY N/A 05/28/2018   Procedure: INTRAVASCULAR PRESSURE WIRE/FFR STUDY;  Surgeon: Sherren Mocha, MD;  Location: [facility];  Service: Cardiovascular;  Laterality: N/A;   JOINT REPLACEMENT  2017   both hips   left calf surgery       related to trauma of being torn    LEFT HEART CATH AND CORONARY ANGIOGRAPHY N/A 05/28/2018   Procedure: LEFT HEART CATH AND CORONARY ANGIOGRAPHY;  Surgeon: Sherren Mocha, MD;  Location: [facility];  Service: Cardiovascular;  Laterality: N/A;   left index finger arthrioscopic surgery      lymph node removed from right neck      right shoulder surgery     TOTAL HIP ARTHROPLASTY Right 03/16/2015   Procedure: RIGHT TOTAL HIP ARTHROPLASTY ANTERIOR APPROACH;  Surgeon: Paralee Cancel, MD;  Location: [facility];  Service: Orthopedics;  Laterality: Right;   TOTAL HIP ARTHROPLASTY Left 06/22/2015   Procedure: LEFT TOTAL HIP ARTHROPLASTY ANTERIOR APPROACH;  Surgeon: Paralee Cancel, MD;  Location: [facility];  Service: Orthopedics;  Laterality: Left;   UPPER GASTROINTESTINAL ENDOSCOPY     hiatal hernia 2cm, mild gastritis - 2012, multiple hyperplastic and fundic gland gastric polyps 2019     Current Outpatient Medications  Medication Sig Dispense Refill   amoxicillin (AMOXIL) 500 MG capsule Take 4 capsules (2,000 mg total) by mouth See admin instructions. Take four capsules (2000 mg) by mouth one hour prior to dental appointments 4 capsule 1   aspirin EC 81  MG EC tablet Take 1 tablet (81 mg total) by mouth daily.     Blood Glucose Monitoring Suppl (FREESTYLE LITE) DEVI Use to check BS bid. DX. E11.9 1 each 0   cetirizine (ZYRTEC) 10 MG tablet Take 10 mg by mouth as needed for allergies or rhinitis.      Continuous Blood Gluc Sensor (FREESTYLE LIBRE 14 DAY SENSOR) MISC Inject 1 each into the skin every 14 (fourteen) days. Use as directed. 6 each 2   ferrous sulfate 325 (65 FE) MG tablet Take 1 tablet (325 mg total) by mouth every other day. (Patient taking differently: Take 325 mg by mouth every morning.) 90 tablet 1   fexofenadine (ALLEGRA) 180 MG tablet Take 1 tablet (180 mg total) by mouth daily. For allergy symptoms 30 tablet 11   fluocinolone (SYNALAR) 0.01 % external solution Apply topically 2  (two) times daily. To scalp 60 mL 5   Insulin Pen Needle (NOVOTWIST) 32G X 5 MM MISC USE WITH INSULIN PENS UP TO 5 TIMES DAILY Dx E11.59 500 each 4   Insulin Pen Needle 32G X 5 MM MISC by Does not apply route. Use with insulin pen needles up to 5 times daily  E11.9     Lancets (FREESTYLE) lancets Check BS BID and PRN. DX.E11.9 300 each 3   metFORMIN (GLUCOPHAGE-XR) 750 MG 24 hr tablet Take 1 tablet (750 mg total) by mouth daily with breakfast. 180 tablet 3   metoprolol succinate (TOPROL-XL) 100 MG 24 hr tablet TAKE 1 TABLET DAILY FOR BLOOD PRESSURE CONTROL 90 tablet 3   nitroGLYCERIN (NITROSTAT) 0.4 MG SL tablet DISSOLVE 1 TABLET UNDER THE TONGUE EVERY 5 MINUTES AS NEEDED FOR CHEST PAIN 25 tablet 1   olmesartan-hydrochlorothiazide (BENICAR HCT) 40-25 MG tablet Take 1 tablet by mouth daily. 90 tablet 3   pantoprazole (PROTONIX) 40 MG tablet Take 1 tablet (40 mg total) by mouth daily. 90 tablet 3   REPATHA SURECLICK 846 MG/ML SOAJ INJECT 140 MG UNDER THE SKIN EVERY 14 DAYS 6 mL 0   rosuvastatin (CRESTOR) 40 MG tablet Take 1 tablet (40 mg total) by mouth every 3 (three) days. 90 tablet 3   terbinafine (LAMISIL) 1 % cream Apply 1 application topically 2 (two) times daily. (Patient taking differently: Apply 1 application  topically 2 (two) times daily. As needed) 30 g 0   TRESIBA FLEXTOUCH 200 UNIT/ML FlexTouch Pen INJECT 72 UNITS UNDER THE SKIN AT BEDTIME 27 mL 0   TRULICITY 4.5 NG/2.9BM SOPN INJECT 4.5 MG INTO THE SKIN ONCE A WEEK 6 mL 0   No current facility-administered medications for this visit.    Allergies:   Brilinta [ticagrelor], Liraglutide, Baxinets [cetylpyridinium-benzocaine], Celebrex [celecoxib], Toprol xl [metoprolol succinate], Triplex ad, Welchol [colesevelam hcl], Norvasc [amlodipine besylate], and Statins    ROS:  Please see the history of present illness.   Otherwise, review of systems are positive for sinus drainage with mild cough and upper airway congestion.   All other  systems are reviewed and negative.    PHYSICAL EXAM: VS:  BP 133/77   Pulse 74   Ht '5\' 8"'$  (1.727 m)   Wt 200 lb 12.8 oz (91.1 kg)   SpO2 94%   BMI 30.53 kg/m  , BMI Body mass index is 30.53 kg/m. GENERAL:  Well appearing NECK:  No jugular venous distention, waveform within normal limits, carotid upstroke brisk and symmetric, no bruits, no thyromegaly LUNGS:  Clear to auscultation bilaterally CHEST:  Unremarkable HEART:  PMI not displaced  or sustained,S1 and S2 within normal limits, no S3, no S4, no clicks, no rubs, no murmurs ABD:  Flat, positive bowel sounds normal in frequency in pitch, no bruits, no rebound, no guarding, no midline pulsatile mass, no hepatomegaly, no splenomegaly EXT:  2 plus pulses throughout, no edema, no cyanosis no clubbing   EKG:  EKG is  ordered today. The ekg ordered today demonstrates sinus rhythm, rate 74, LAD,  intervals within normal limits, no acute ST-T wave changes.  No change previous.   Recent Labs: 08/18/2021: ALT 18; BUN 11; Creatinine, Ser 1.05; Potassium 4.6; Sodium 138 11/16/2021: Hemoglobin 15.4; Platelets 217    Lipid Panel    Component Value Date/Time   CHOL 112 08/18/2021 0925   CHOL 157 12/31/2012 0857   TRIG 272 (H) 08/18/2021 0925   TRIG 271 (H) 02/17/2016 0824   TRIG 184 (H) 12/31/2012 0857   HDL 35 (L) 08/18/2021 0925   HDL 44 02/17/2016 0824   HDL 45 12/31/2012 0857   CHOLHDL 3.2 08/18/2021 0925   LDLCALC 36 08/18/2021 0925   LDLCALC 52 01/19/2014 1007   LDLCALC 75 12/31/2012 0857   LDLDIRECT 39 11/02/2016 0806      Wt Readings from Last 3 Encounters:  04/03/22 200 lb 12.8 oz (91.1 kg)  01/12/22 197 lb (89.4 kg)  11/16/21 200 lb 6.4 oz (90.9 kg)      Other studies Reviewed: Additional studies/ records that were reviewed today include: Labs. Review of the above records demonstrates:  Please see elsewhere in the note.     ASSESSMENT AND PLAN:  CAD-  The patient has no new sypmtoms.  No further  cardiovascular testing is indicated.  We will continue with aggressive risk reduction and meds as listed.  HYPERLIPIDEMIA -  LDL was 36 with an HDL of 35.  No change in therapy.   HYPERTENSION -  The blood pressure is at target.  Continue the meds as listed.   DM -  A1C was down slightly to 7.7 from 7.9.  No change in therapy.   Current medicines are reviewed at length with the patient today.  The patient does not have concerns regarding medicines.  The following changes have been made:  None  Labs/ tests ordered today include:   None    Orders Placed This Encounter  Procedures   EKG 12-Lead     Disposition:   FU with me in 12 months.     Signed, Minus Breeding, MD  04/03/2022 1:30 PM    [facility]

## 2022-04-03 ENCOUNTER — Encounter: Payer: Self-pay | Admitting: Cardiology

## 2022-04-03 ENCOUNTER — Ambulatory Visit: Payer: Medicare Other | Attending: Cardiology | Admitting: Cardiology

## 2022-04-03 VITALS — BP 133/77 | HR 74 | Ht 68.0 in | Wt 200.8 lb

## 2022-04-03 DIAGNOSIS — Z9861 Coronary angioplasty status: Secondary | ICD-10-CM | POA: Diagnosis not present

## 2022-04-03 DIAGNOSIS — E785 Hyperlipidemia, unspecified: Secondary | ICD-10-CM | POA: Insufficient documentation

## 2022-04-03 DIAGNOSIS — I10 Essential (primary) hypertension: Secondary | ICD-10-CM | POA: Diagnosis not present

## 2022-04-03 DIAGNOSIS — E118 Type 2 diabetes mellitus with unspecified complications: Secondary | ICD-10-CM | POA: Insufficient documentation

## 2022-04-03 DIAGNOSIS — I2510 Atherosclerotic heart disease of native coronary artery without angina pectoris: Secondary | ICD-10-CM | POA: Diagnosis not present

## 2022-04-03 NOTE — Patient Instructions (Signed)
Medication Instructions:  Your physician recommends that you continue on your current medications as directed. Please refer to the Current Medication list given to you today.  *If you need a refill on your cardiac medications before your next appointment, please call your pharmacy*  Follow-Up: At [facility], you and your health needs are our priority.  As part of our continuing mission to provide you with exceptional heart care, we have created designated Provider Care Teams.  These Care Teams include your primary Cardiologist (physician) and Advanced Practice Providers (APPs -  Physician Assistants and Nurse Practitioners) who all work together to provide you with the care you need, when you need it.  We recommend signing up for the patient portal called "MyChart".  Sign up information is provided on this After Visit Summary.  MyChart is used to connect with patients for Virtual Visits (Telemedicine).  Patients are able to view lab/test results, encounter notes, upcoming appointments, etc.  Non-urgent messages can be sent to your provider as well.   To learn more about what you can do with MyChart, go to [email or web address].    Your next appointment:   12 month(s)  The format for your next appointment:   In Person  Provider:   Keevon Hochrein, MD      

## 2022-04-04 DIAGNOSIS — H04123 Dry eye syndrome of bilateral lacrimal glands: Secondary | ICD-10-CM | POA: Diagnosis not present

## 2022-04-04 DIAGNOSIS — Z794 Long term (current) use of insulin: Secondary | ICD-10-CM | POA: Diagnosis not present

## 2022-04-04 DIAGNOSIS — Z961 Presence of intraocular lens: Secondary | ICD-10-CM | POA: Diagnosis not present

## 2022-04-04 DIAGNOSIS — E119 Type 2 diabetes mellitus without complications: Secondary | ICD-10-CM | POA: Diagnosis not present

## 2022-04-04 LAB — HM DIABETES EYE EXAM

## 2022-05-15 ENCOUNTER — Encounter: Payer: Self-pay | Admitting: Nurse Practitioner

## 2022-05-15 ENCOUNTER — Ambulatory Visit (INDEPENDENT_AMBULATORY_CARE_PROVIDER_SITE_OTHER): Payer: Medicare Other | Admitting: Nurse Practitioner

## 2022-05-15 VITALS — BP 119/77 | HR 77 | Ht 68.0 in | Wt 201.2 lb

## 2022-05-15 DIAGNOSIS — E782 Mixed hyperlipidemia: Secondary | ICD-10-CM

## 2022-05-15 DIAGNOSIS — E1159 Type 2 diabetes mellitus with other circulatory complications: Secondary | ICD-10-CM

## 2022-05-15 DIAGNOSIS — I10 Essential (primary) hypertension: Secondary | ICD-10-CM | POA: Diagnosis not present

## 2022-05-15 LAB — POCT GLYCOSYLATED HEMOGLOBIN (HGB A1C): Hemoglobin A1C: 8.6 % — AB (ref 4.0–5.6)

## 2022-05-15 MED ORDER — METFORMIN HCL ER 750 MG PO TB24
750.0000 mg | ORAL_TABLET | Freq: Every day | ORAL | 3 refills | Status: DC
Start: 2022-05-15 — End: 2022-09-13

## 2022-05-15 MED ORDER — INSULIN PEN NEEDLE 32G X 5 MM MISC: 4 refills | Status: AC

## 2022-05-15 MED ORDER — INSULIN GLARGINE 100 UNIT/ML ~~LOC~~ SOLN: 64.0000 [IU] | Freq: Every day | SUBCUTANEOUS | 3 refills | Status: DC

## 2022-05-15 MED ORDER — TRESIBA FLEXTOUCH 200 UNIT/ML ~~LOC~~ SOPN: 64.0000 [IU] | PEN_INJECTOR | Freq: Every evening | SUBCUTANEOUS | 3 refills | Status: DC

## 2022-05-15 MED ORDER — TRULICITY 4.5 MG/0.5ML ~~LOC~~ SOAJ: SUBCUTANEOUS | 3 refills | Status: DC

## 2022-05-15 MED ORDER — FREESTYLE LANCETS MISC: 3 refills | Status: AC

## 2022-05-15 MED ORDER — FREESTYLE LIBRE 14 DAY SENSOR MISC: 1.0000 | 2 refills | Status: DC

## 2022-05-15 NOTE — Progress Notes (Signed)
05/15/2022, 2:58 PM     Endocrinology follow-up note   Subjective:    Patient ID: Marvin Frost, male    DOB: [birth year].   Marvin Frost is being seen in follow-up after he was seen in consultation for management of currently uncontrolled symptomatic diabetes. -He also has dyslipidemia, hypertension. PMD:   [name], MD.   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    Cataract    both removed   Cervical radiculopathy 09/14/2020   Diabetes mellitus 1995   Dysrhythmia    history of heart palpitations    Gastric polyps 09/18/2017   GERD (gastroesophageal reflux disease)    History of frequent urinary tract infections    History of hiatal hernia    Hyperlipemia    Hypertension    Obesity    Pancreatitis     Past Surgical History:  Procedure Laterality Date   CARDIAC CATHETERIZATION     2004 and 1999- done by Dr Percival Spanish    COLONOSCOPY  11/2003   diverticulosis, no polyps   CORONARY STENT INTERVENTION N/A 05/28/2018   Procedure: CORONARY STENT INTERVENTION;  Surgeon: Sherren Mocha, MD;  Location: [facility];  Service: Cardiovascular;  Laterality: N/A;   EYE SURGERY Bilateral    cateracts - dr Katy Fitch   HYDROCELE EXCISION / REPAIR     INTRAVASCULAR PRESSURE WIRE/FFR STUDY N/A 05/28/2018   Procedure: INTRAVASCULAR PRESSURE WIRE/FFR STUDY;  Surgeon: Sherren Mocha, MD;  Location: [facility];  Service: Cardiovascular;  Laterality: N/A;   JOINT REPLACEMENT  2017   both hips   left calf surgery      related to trauma of being torn    LEFT HEART CATH AND CORONARY ANGIOGRAPHY N/A 05/28/2018   Procedure: LEFT HEART CATH AND CORONARY ANGIOGRAPHY;  Surgeon: Sherren Mocha, MD;  Location: [facility];  Service:  Cardiovascular;  Laterality: N/A;   left index finger arthrioscopic surgery      lymph node removed from right neck      right shoulder surgery     TOTAL HIP ARTHROPLASTY Right 03/16/2015   Procedure: RIGHT TOTAL HIP ARTHROPLASTY ANTERIOR APPROACH;  Surgeon: Paralee Cancel, MD;  Location: [facility];  Service: Orthopedics;  Laterality: Right;   TOTAL HIP ARTHROPLASTY Left 06/22/2015   Procedure: LEFT TOTAL HIP ARTHROPLASTY ANTERIOR APPROACH;  Surgeon: Paralee Cancel, MD;  Location: [facility];  Service: Orthopedics;  Laterality: Left;   UPPER GASTROINTESTINAL ENDOSCOPY     hiatal hernia 2cm, mild gastritis - 2012, multiple hyperplastic and fundic gland gastric polyps 2019    Social History   Socioeconomic History   Marital status: Married    Spouse name: [name]   Number of children: 1   Years of education: Some College   Highest education level: Associate degree: occupational, Hotel manager, or vocational program  Occupational History   Occupation: Best boy:  [employer]    Comment: retired  Tobacco Use   Smoking status: Former    Packs/day: 1.00    Years: 10.00    Total pack years: 10.00    Types: Cigarettes    Quit date: 04/06/1984    Years since quitting: 38.1   Smokeless tobacco: Former    Types: Chew    Quit date: 06/26/1978   Tobacco comments:    I do not smoke  Vaping Use   Vaping Use: Never used  Substance and Sexual Activity   Alcohol use: No   Drug use: No   Sexual activity: Yes    Birth control/protection: None  Other Topics Concern   Not on file  Social History Narrative   Married   Retired from [employer]   1 child   Former but no current tobacco   No EtOH/drugs   Social Determinants of Radio broadcast assistant Strain: Low Risk  (07/28/2021)   Overall Financial Resource Strain (CARDIA)    Difficulty of Paying Living Expenses: Not hard at all  Food Insecurity: No Food Insecurity (07/28/2021)   Hunger Vital Sign    Worried About Running Out of  Food in the Last Year: Never true    [place] in the Last Year: Never true  Transportation Needs: No Transportation Needs (07/28/2021)   PRAPARE - Hydrologist (Medical): No    Lack of Transportation (Non-Medical): No  Physical Activity: Insufficiently Active (07/28/2021)   Exercise Vital Sign    Days of Exercise per Week: 4 days    Minutes of Exercise per Session: 30 min  Stress: No Stress Concern Present (07/28/2021)   [place]    Feeling of Stress : Not at all  Social Connections: Moderately Integrated (07/28/2021)   Social Connection and Isolation Panel [NHANES]    Frequency of Communication with Friends and Family: Once a week    Frequency of Social Gatherings with Friends and Family: Once a week    Attends Religious Services: More than 4 times per year    Active Member of [employer] or Organizations: Yes    Attends Music therapist: More than 4 times per year    Marital Status: Married    Family History  Problem Relation Age of Onset   Heart disease Mother    Heart attack Mother    Diabetes Brother    Heart disease Brother    Heart attack Brother 74   Heart attack Father 87   Early death Father    Heart disease Father    GI problems Sister        torn esophagus during endoscopy   Healthy Daughter    Colon polyps Neg Hx    Esophageal cancer Neg Hx    Rectal cancer Neg Hx    Stomach cancer Neg Hx    Colon cancer Neg Hx     Outpatient Encounter Medications as of 05/15/2022  Medication Sig   amoxicillin (AMOXIL) 500 MG capsule Take 4 capsules (2,000 mg total) by mouth See admin instructions. Take four capsules (2000 mg) by mouth one hour prior to dental appointments   aspirin EC 81 MG EC tablet Take 1 tablet (81 mg total) by mouth daily.   Blood Glucose Monitoring Suppl (FREESTYLE LITE) DEVI Use to check BS bid. DX. E11.9   cetirizine (ZYRTEC) 10 MG tablet Take 10  mg by mouth as needed for  allergies or rhinitis.    ferrous sulfate 325 (65 FE) MG tablet Take 1 tablet (325 mg total) by mouth every other day. (Patient taking differently: Take 325 mg by mouth every morning.)   fexofenadine (ALLEGRA) 180 MG tablet Take 1 tablet (180 mg total) by mouth daily. For allergy symptoms   fluocinolone (SYNALAR) 0.01 % external solution Apply topically 2 (two) times daily. To scalp   Insulin Pen Needle 32G X 5 MM MISC by Does not apply route. Use with insulin pen needles up to 5 times daily  E11.9   metoprolol succinate (TOPROL-XL) 100 MG 24 hr tablet TAKE 1 TABLET DAILY FOR BLOOD PRESSURE CONTROL   nitroGLYCERIN (NITROSTAT) 0.4 MG SL tablet DISSOLVE 1 TABLET UNDER THE TONGUE EVERY 5 MINUTES AS NEEDED FOR CHEST PAIN   olmesartan-hydrochlorothiazide (BENICAR HCT) 40-25 MG tablet Take 1 tablet by mouth daily.   pantoprazole (PROTONIX) 40 MG tablet Take 1 tablet (40 mg total) by mouth daily.   REPATHA SURECLICK 546 MG/ML SOAJ INJECT 140 MG UNDER THE SKIN EVERY 14 DAYS   rosuvastatin (CRESTOR) 40 MG tablet Take 1 tablet (40 mg total) by mouth every 3 (three) days.   terbinafine (LAMISIL) 1 % cream Apply 1 application topically 2 (two) times daily. (Patient taking differently: Apply 1 application  topically 2 (two) times daily. As needed)   [DISCONTINUED] Continuous Blood Gluc Sensor (FREESTYLE LIBRE 14 DAY SENSOR) MISC Inject 1 each into the skin every 14 (fourteen) days. Use as directed.   [DISCONTINUED] Insulin Pen Needle (NOVOTWIST) 32G X 5 MM MISC USE WITH INSULIN PENS UP TO 5 TIMES DAILY Dx E11.59   [DISCONTINUED] Lancets (FREESTYLE) lancets Check BS BID and PRN. DX.E11.9   [DISCONTINUED] metFORMIN (GLUCOPHAGE-XR) 750 MG 24 hr tablet Take 1 tablet (750 mg total) by mouth daily with breakfast.   [DISCONTINUED] TRESIBA FLEXTOUCH 200 UNIT/ML FlexTouch Pen INJECT 72 UNITS UNDER THE SKIN AT BEDTIME   [DISCONTINUED] TRULICITY 4.5 EV/0.3JK SOPN INJECT 4.5 MG INTO THE SKIN ONCE A  WEEK   Continuous Blood Gluc Sensor (FREESTYLE LIBRE 14 DAY SENSOR) MISC Inject 1 each into the skin every 14 (fourteen) days. Use as directed.   Dulaglutide (TRULICITY) 4.5 KX/3.8HW SOPN INJECT 4.5 MG INTO THE SKIN ONCE A WEEK   insulin glargine (LANTUS) 100 UNIT/ML injection Inject 0.64 mLs (64 Units total) into the skin at bedtime.   Insulin Pen Needle (NOVOTWIST) 32G X 5 MM MISC USE WITH INSULIN PENS UP TO 5 TIMES DAILY Dx E11.59   Lancets (FREESTYLE) lancets Check BS BID and PRN. DX.E11.9   metFORMIN (GLUCOPHAGE-XR) 750 MG 24 hr tablet Take 1 tablet (750 mg total) by mouth daily with breakfast.   [DISCONTINUED] insulin degludec (TRESIBA FLEXTOUCH) 200 UNIT/ML FlexTouch Pen Inject 64 Units into the skin at bedtime.   No facility-administered encounter medications on file as of 05/15/2022.    ALLERGIES: Allergies  Allergen Reactions   Brilinta [Ticagrelor]     Rash and SOB    Liraglutide Other (See Comments)    Abdominal pain and enlarged pancreas on CT ? pancreatitis   Baxinets [Cetylpyridinium-Benzocaine] Other (See Comments)    Unknown reaction   Celebrex [Celecoxib] Nausea Only   Toprol Xl [Metoprolol Succinate] Other (See Comments)    Unknown reaction - pt states it did not work   Triplex Ad Other (See Comments)    Abdominal pain and enlarged pancreas on CT? pancreatitis   Welchol [Colesevelam Hcl] Other (See Comments)    constipation   Norvasc [Amlodipine  Besylate] Rash   Statins Other (See Comments)    muscle and joint pain if taken for long periods of time, takes Crestor every 2-3 days to avoid this reaction    VACCINATION STATUS: Immunization History  Administered Date(s) Administered   Fluad Quad(high Dose 65+) 03/26/2019, 04/19/2020, 05/12/2021, 03/30/2022   Influenza Whole 02/24/2010   Influenza, High Dose Seasonal PF 04/15/2015, 04/12/2016, 04/05/2017, 04/16/2018   Influenza,inj,Quad PF,6+ Mos 03/26/2013, 03/30/2014   Moderna Sars-Covid-2 Vaccination  07/31/2019, 08/28/2019, 03/15/2020   Pneumococcal Conjugate-13 06/17/2013   Pneumococcal Polysaccharide-23 02/24/2009   Td 02/24/2006   Tdap 04/27/2011   Zoster Recombinat (Shingrix) 07/22/2018   Zoster, Live 07/27/2006    Diabetes He presents for his follow-up diabetic visit. He has type 2 diabetes mellitus. Onset time: He was diagnosed at approximate age of 72 years. His disease course has been stable. There are no hypoglycemic associated symptoms. Pertinent negatives for hypoglycemia include no confusion, headaches, pallor or seizures. Pertinent negatives for diabetes include no chest pain, no fatigue, no polydipsia, no polyphagia, no polyuria and no weakness. There are no hypoglycemic complications. Symptoms are stable. Diabetic complications include heart disease and nephropathy. Risk factors for coronary artery disease include diabetes mellitus, dyslipidemia, family history, male sex, hypertension, sedentary lifestyle, tobacco exposure and obesity. Current diabetic treatment includes oral agent (monotherapy) and insulin injections (Trulicity). He is compliant with treatment most of the time. His weight is fluctuating minimally. He is following a generally healthy diet. When asked about meal planning, he reported none. He has not had a previous visit with a dietitian. He participates in exercise intermittently. His home blood glucose trend is fluctuating minimally. His breakfast blood glucose range is generally 140-180 mg/dl. His lunch blood glucose range is generally 140-180 mg/dl. His dinner blood glucose range is generally 140-180 mg/dl. His bedtime blood glucose range is generally 140-180 mg/dl. His overall blood glucose range is 140-180 mg/dl. (He presents today with his CGM and logs showing slightly above target glycemic profile.  His POCT A1c today is 8.6%, increasing from previous visit of 7.7%.  He notes he did have a month where his glucose was really high but the only difference he noticed  was that he was not as active during that time.  Analysis of his CGM shows TIR 51%, TAR 49%, TBR 0% with a GMI of 7.7%.) An ACE inhibitor/angiotensin II receptor blocker is being taken. He does not see a podiatrist.Eye exam is current.  Hyperlipidemia This is a chronic problem. The current episode started more than 1 year ago. The problem is controlled. Recent lipid tests were reviewed and are normal. Exacerbating diseases include chronic renal disease, diabetes and obesity. Factors aggravating his hyperlipidemia include beta blockers. Pertinent negatives include no chest pain, myalgias or shortness of breath. Current antihyperlipidemic treatment includes statins (Repatha every 2 weeks. Only takes his statin once every few days dt myalgias). The current treatment provides moderate improvement of lipids. Compliance problems include medication side effects.  Risk factors for coronary artery disease include male sex, obesity, family history, dyslipidemia, diabetes mellitus, hypertension and a sedentary lifestyle.  Hypertension This is a chronic problem. The current episode started more than 1 year ago. The problem has been gradually improving since onset. The problem is controlled. Pertinent negatives include no chest pain, headaches, neck pain, palpitations or shortness of breath. There are no associated agents to hypertension. Risk factors for coronary artery disease include dyslipidemia, diabetes mellitus, family history, male gender, obesity, sedentary lifestyle and smoking/tobacco exposure. Past treatments include  angiotensin blockers, diuretics and beta blockers. The current treatment provides moderate improvement. There are no compliance problems.  Hypertensive end-organ damage includes CAD/MI. Identifiable causes of hypertension include chronic renal disease.    Review of systems  Constitutional: + stable body weight,  current Body mass index is 30.59 kg/m. , no fatigue, no subjective hyperthermia, no  subjective hypothermia Eyes: no blurry vision, no xerophthalmia ENT: no sore throat, no nodules palpated in throat, no dysphagia/odynophagia, no hoarseness Cardiovascular: no chest pain, no shortness of breath, no palpitations, no leg swelling Respiratory: no cough, no shortness of breath Gastrointestinal: no nausea/vomiting/diarrhea Musculoskeletal: no muscle/joint aches Skin: no rashes, no hyperemia Neurological: no tremors, no numbness, no tingling, no dizziness Psychiatric: no depression, no anxiety   Objective:    BP 119/77 (BP Location: Left Arm, Patient Position: Sitting, Cuff Size: Large)   Pulse 77   Ht '5\' 8"'$  (1.727 m)   Wt 201 lb 3.2 oz (91.3 kg)   BMI 30.59 kg/m   Wt Readings from Last 3 Encounters:  05/15/22 201 lb 3.2 oz (91.3 kg)  04/03/22 200 lb 12.8 oz (91.1 kg)  01/12/22 197 lb (89.4 kg)    BP Readings from Last 3 Encounters:  05/15/22 119/77  04/03/22 133/77  01/12/22 110/73     Physical Exam- Limited  Constitutional:  Body mass index is 30.59 kg/m. , not in acute distress, normal state of mind Eyes:  EOMI, no exophthalmos Neck: Supple Cardiovascular: RRR, no murmurs, rubs, or gallops, no edema Respiratory: Adequate breathing efforts, no crackles, rales, rhonchi, or wheezing Musculoskeletal: no gross deformities, strength intact in all four extremities, no gross restriction of joint movements Skin:  no rashes, no hyperemia Neurological: no tremor with outstretched hands   Diabetic Foot Exam - Simple   No data filed    CMP     Component Value Date/Time   NA 138 08/18/2021 0925   K 4.6 08/18/2021 0925   CL 99 08/18/2021 0925   CO2 25 08/18/2021 0925   GLUCOSE 155 (H) 08/18/2021 0925   GLUCOSE 154 (H) 05/29/2018 0217   BUN 11 08/18/2021 0925   CREATININE 1.05 08/18/2021 0925   CREATININE 0.95 12/31/2012 0857   CALCIUM 9.3 08/18/2021 0925   PROT 6.4 08/18/2021 0925   ALBUMIN 4.0 08/18/2021 0925   AST 17 08/18/2021 0925   ALT 18 08/18/2021  0925   ALKPHOS 60 08/18/2021 0925   BILITOT 0.3 08/18/2021 0925   GFRNONAA 68 07/07/2020 1028   GFRNONAA 81 12/31/2012 0857   GFRAA 78 07/07/2020 1028   GFRAA >89 12/31/2012 0857    Diabetic Labs (most recent): Lab Results  Component Value Date   HGBA1C 8.6 (A) 05/15/2022   HGBA1C 7.7 (H) 11/16/2021   HGBA1C 7.8 (H) 08/18/2021   MICROALBUR 30 10/04/2021   MICROALBUR 10 12/30/2020   MICROALBUR 20 01/26/2015     Lipid Panel ( most recent) Lipid Panel     Component Value Date/Time   CHOL 112 08/18/2021 0925   CHOL 157 12/31/2012 0857   TRIG 272 (H) 08/18/2021 0925   TRIG 271 (H) 02/17/2016 0824   TRIG 184 (H) 12/31/2012 0857   HDL 35 (L) 08/18/2021 0925   HDL 44 02/17/2016 0824   HDL 45 12/31/2012 0857   CHOLHDL 3.2 08/18/2021 0925   LDLCALC 36 08/18/2021 0925   LDLCALC 52 01/19/2014 1007   LDLCALC 75 12/31/2012 0857   LDLDIRECT 39 11/02/2016 0806      Lab Results  Component Value Date  TSH 3.350 07/07/2020   TSH 2.720 12/30/2019   TSH 2.290 09/03/2014   FREET4 1.42 07/07/2020     Assessment & Plan:   1) DM type 2 causing vascular disease ([place])  -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  - Ethaniel Garfield has currently uncontrolled symptomatic type 2 DM since  76 years of age.  He presents today with his CGM and logs showing slightly above target glycemic profile.  His POCT A1c today is 8.6%, increasing from previous visit of 7.7%.  He notes he did have a month where his glucose was really high but the only difference he noticed was that he was not as active during that time.  Analysis of his CGM shows TIR 51%, TAR 49%, TBR 0% with a GMI of 7.7%.  -I have reviewed his recent labs with him.  - I had a long discussion with him about the progressive nature of diabetes and the pathology behind its complications. -his diabetes is complicated by coronary artery disease, obesity and he remains at a high risk for more acute and  chronic complications which include CAD, CVA, CKD, retinopathy, and neuropathy. These are all discussed in detail with him.  The following Lifestyle Medicine recommendations according to [facility]) were discussed and offered to patient and he agrees to start the journey:  A. Whole Foods, Plant-based plate comprising of fruits and vegetables, plant-based proteins, whole-grain carbohydrates was discussed in detail with the patient.   A list for source of those nutrients were also provided to the patient.  Patient will use only water or unsweetened tea for hydration. B.  The need to stay away from risky substances including alcohol, smoking; obtaining 7 to 9 hours of restorative sleep, at least 150 minutes of moderate intensity exercise weekly, the importance of healthy social connections,  and stress reduction techniques were discussed. C.  A full color page of  Calorie density of various food groups per pound showing examples of each food groups was provided to the patient.  - Nutritional counseling repeated at each appointment due to patients tendency to fall back in to old habits.  - The patient admits there is a room for improvement in their diet and drink choices. -  Suggestion is made for the patient to avoid simple carbohydrates from their diet including Cakes, Sweet Desserts / Pastries, Ice Cream, Soda (diet and regular), Sweet Tea, Candies, Chips, Cookies, Sweet Pastries, Store Bought Juices, Alcohol in Excess of 1-2 drinks a day, Artificial Sweeteners, Coffee Creamer, and "Sugar-free" Products. This will help patient to have stable blood glucose profile and potentially avoid unintended weight gain.   - I encouraged the patient to switch to unprocessed or minimally processed complex starch and increased protein intake (animal or plant source), fruits, and vegetables.   - Patient is advised to stick to a routine mealtimes to eat 3 meals a day and avoid  unnecessary snacks (to snack only to correct hypoglycemia).  - I have approached him with the following individualized plan to manage diabetes and patient agrees:   -He is advised to continue Tresiba 64 units SQ nightly (changed to Lantus due to insurance preference), Metformin 750 mg ER daily with breakfast (had been taking twice daily), and Trulicity 4.5 mg SQ weekly.  -He is encouraged to continue monitoring blood glucose using his CGM at least 4 times per day, before meals and at bedtime and call the clinic if he gets readings less  than 70 or greater than 200 for 3 tests in a row.  - Patient specific target  A1c;  LDL, HDL, Triglycerides,  were discussed in detail.  2) Blood Pressure /Hypertension:   His blood pressure is controlled to target.  He is advised to continue Metoprolol 100 mg po daily and Benicar HCT 40-25 mg po daily  3) Lipids/Hyperlipidemia: His most recent lipid panel from 08/18/21 shows controlled LDL of 36 and elevated triglycerides of 272 . He is statin intolerant with myalgias.  He has tolerated taking his Crestor every 2-3 days.  He is advised to continue taking his Crestor 40 mg po every 3 days or so and continue his Repatha 140 mg SQ every 2 weeks.  Side effects and precautions discussed with him.    4)  Weight/Diet:  His Body mass index is 30.59 kg/m.--   He has lost some weight over the last few visits and will benefit from continued weight loss.  I discussed with him the fact that loss of 5 - 10% of his  current body weight will have the most impact on his diabetes management.  CDE Consult will be initiated . Exercise, and detailed carbohydrates information provided  -  detailed on discharge instructions.  5) Chronic Care/Health Maintenance: -he is on ACEI/ARB and Statin medications and is encouraged to initiate and continue to follow up with Ophthalmology, Dentist, Podiatrist at least yearly or according to recommendations, and advised to  stay away from smoking. I  have recommended yearly flu vaccine and pneumonia vaccine at least every 5 years; moderate intensity exercise for up to 150 minutes weekly; and  sleep for at least 7 hours a day.  - he is advised to maintain close follow up with [name], MD for primary care needs, as well as his other providers for optimal and coordinated care.      I spent 43 minutes in the care of the patient today including review of labs from [place], Lipids, Thyroid Function, Hematology (current and previous including abstractions from other facilities); face-to-face time discussing  his blood glucose readings/logs, discussing hypoglycemia and hyperglycemia episodes and symptoms, medications doses, his options of short and long term treatment based on the latest standards of care / guidelines;  discussion about incorporating lifestyle medicine;  and documenting the encounter. Risk reduction counseling performed per USPSTF guidelines to reduce obesity and cardiovascular risk factors.     Please refer to Patient Instructions for Blood Glucose Monitoring and Insulin/Medications Dosing Guide"  in media tab for additional information. Please  also refer to " Patient Self Inventory" in the Media  tab for reviewed elements of pertinent patient history.  [name] participated in the discussions, expressed understanding, and voiced agreement with the above plans.  All questions were answered to his satisfaction. he is encouraged to contact clinic should he have any questions or concerns prior to his return visit.    Follow up plan: - Return in about 4 months (around 09/13/2022) for Diabetes F/U with A1c in office, No previsit labs, Bring meter and logs.    Rayetta Pigg, [facility] [address] [place], [place] [address] Phone: [phone] Fax: [phone]  05/15/2022, 2:58 PM

## 2022-05-22 ENCOUNTER — Ambulatory Visit (INDEPENDENT_AMBULATORY_CARE_PROVIDER_SITE_OTHER): Payer: Medicare Other | Admitting: Family Medicine

## 2022-05-22 ENCOUNTER — Encounter: Payer: Self-pay | Admitting: Family Medicine

## 2022-05-22 VITALS — BP 114/59 | HR 74 | Temp 97.7°F | Ht 68.0 in | Wt 199.6 lb

## 2022-05-22 DIAGNOSIS — E782 Mixed hyperlipidemia: Secondary | ICD-10-CM

## 2022-05-22 DIAGNOSIS — I10 Essential (primary) hypertension: Secondary | ICD-10-CM

## 2022-05-22 DIAGNOSIS — I2510 Atherosclerotic heart disease of native coronary artery without angina pectoris: Secondary | ICD-10-CM

## 2022-05-22 DIAGNOSIS — E756 Lipid storage disorder, unspecified: Secondary | ICD-10-CM

## 2022-05-22 DIAGNOSIS — E1169 Type 2 diabetes mellitus with other specified complication: Secondary | ICD-10-CM | POA: Diagnosis not present

## 2022-05-22 DIAGNOSIS — Z9861 Coronary angioplasty status: Secondary | ICD-10-CM | POA: Diagnosis not present

## 2022-05-22 NOTE — Progress Notes (Signed)
Subjective:  Patient ID: Marvin Frost, male    DOB: 08-11-1945  Age: 76 y.o. MRN: [id number]  CC: Medical Management of Chronic Issues   HPI Marvin Frost presents for  follow-up of hypertension. Patient has no history of headache chest pain or shortness of breath or recent cough. Patient also denies symptoms of TIA such as focal numbness or weakness. Patient denies side effects from medication. States taking it regularly.  Diabetes followed by endocrine. Exactly where he needs to be on [place], per his report from endocrine. Ophthalmology said no retinopathy.    History Marvin Frost has a past medical history of Allergy, Anemia, Arthritis, CAD (coronary artery disease), Cancer ([place]), Cataract, Cervical radiculopathy (09/14/2020), Diabetes mellitus (1995), Dysrhythmia, Gastric polyps (09/18/2017), GERD (gastroesophageal reflux disease), History of frequent urinary tract infections, History of hiatal hernia, Hyperlipemia, Hypertension, Obesity, and Pancreatitis.   He has a past surgical history that includes right shoulder surgery; Hydrocele excision / repair; Colonoscopy (11/2003); Upper gastrointestinal endoscopy; Cardiac catheterization; left calf surgery ; left index finger arthrioscopic surgery ; lymph node removed from right neck ; Total hip arthroplasty (Right, 03/16/2015); Total hip arthroplasty (Left, 06/22/2015); Eye surgery (Bilateral); LEFT HEART CATH AND CORONARY ANGIOGRAPHY (N/A, 05/28/2018); CORONARY STENT INTERVENTION (N/A, 05/28/2018); INTRAVASCULAR PRESSURE WIRE/FFR STUDY (N/A, 05/28/2018); and Joint replacement (2017).   His family history includes Diabetes in his brother; Early death in his father; GI problems in his sister; Healthy in his daughter; Heart attack in his mother; Heart attack (age of onset: 4) in his father; Heart attack (age of onset: 66) in his brother; Heart disease in his brother, father, and mother.He reports that he quit smoking about 38 years ago. His smoking use  included cigarettes. He has a 10.00 pack-year smoking history. He quit smokeless tobacco use about 43 years ago.  His smokeless tobacco use included chew. He reports that he does not drink alcohol and does not use drugs.  Current Outpatient Medications on File Prior to Visit  Medication Sig Dispense Refill   aspirin EC 81 MG EC tablet Take 1 tablet (81 mg total) by mouth daily.     cetirizine (ZYRTEC) 10 MG tablet Take 10 mg by mouth as needed for allergies or rhinitis.      Continuous Blood Gluc Sensor (FREESTYLE LIBRE 14 DAY SENSOR) MISC Inject 1 each into the skin every 14 (fourteen) days. Use as directed. 6 each 2   Dulaglutide (TRULICITY) 4.5 YP/9.5KD SOPN INJECT 4.5 MG INTO THE SKIN ONCE A WEEK 6 mL 3   ferrous sulfate 325 (65 FE) MG tablet Take 1 tablet (325 mg total) by mouth every other day. (Patient taking differently: Take 325 mg by mouth every morning.) 90 tablet 1   fluocinolone (SYNALAR) 0.01 % external solution Apply topically 2 (two) times daily. To scalp 60 mL 5   insulin glargine (LANTUS) 100 UNIT/ML injection Inject 0.64 mLs (64 Units total) into the skin at bedtime. 60 mL 3   Insulin Pen Needle (NOVOTWIST) 32G X 5 MM MISC USE WITH INSULIN PENS UP TO 5 TIMES DAILY Dx E11.59 500 each 4   Insulin Pen Needle 32G X 5 MM MISC by Does not apply route. Use with insulin pen needles up to 5 times daily  E11.9     Lancets (FREESTYLE) lancets Check BS BID and PRN. DX.E11.9 300 each 3   metFORMIN (GLUCOPHAGE-XR) 750 MG 24 hr tablet Take 1 tablet (750 mg total) by mouth daily with breakfast. 90 tablet 3   metoprolol succinate (TOPROL-XL)  100 MG 24 hr tablet TAKE 1 TABLET DAILY FOR BLOOD PRESSURE CONTROL 90 tablet 3   nitroGLYCERIN (NITROSTAT) 0.4 MG SL tablet DISSOLVE 1 TABLET UNDER THE TONGUE EVERY 5 MINUTES AS NEEDED FOR CHEST PAIN 25 tablet 1   olmesartan-hydrochlorothiazide (BENICAR HCT) 40-25 MG tablet Take 1 tablet by mouth daily. 90 tablet 3   pantoprazole (PROTONIX) 40 MG tablet Take  1 tablet (40 mg total) by mouth daily. 90 tablet 3   REPATHA SURECLICK 423 MG/ML SOAJ INJECT 140 MG UNDER THE SKIN EVERY 14 DAYS 6 mL 0   rosuvastatin (CRESTOR) 40 MG tablet Take 1 tablet (40 mg total) by mouth every 3 (three) days. 90 tablet 3   terbinafine (LAMISIL) 1 % cream Apply 1 application topically 2 (two) times daily. (Patient taking differently: Apply 1 application  topically 2 (two) times daily. As needed) 30 g 0   No current facility-administered medications on file prior to visit.    ROS Review of Systems  Constitutional:  Negative for fever.  Respiratory:  Negative for shortness of breath.   Cardiovascular:  Negative for chest pain.  Musculoskeletal:  Negative for arthralgias.  Skin:  Negative for rash.    Objective:  BP (!) 114/59   Pulse 74   Temp 97.7 F (36.5 C)   Ht _0  (1.727 m)   Wt 199 lb 9.6 oz (90.5 kg)   SpO2 96%   BMI 30.35 kg/m   BP Readings from Last 3 Encounters:  05/22/22 (!) 114/59  05/15/22 119/77  04/03/22 133/77    Wt Readings from Last 3 Encounters:  05/22/22 199 lb 9.6 oz (90.5 kg)  05/15/22 201 lb 3.2 oz (91.3 kg)  04/03/22 200 lb 12.8 oz (91.1 kg)     Physical Exam Vitals reviewed.  Constitutional:      Appearance: He is well-developed.  HENT:     Head: Normocephalic and atraumatic.     Right Ear: External ear normal.     Left Ear: External ear normal.     Mouth/Throat:     Pharynx: No oropharyngeal exudate or posterior oropharyngeal erythema.  Eyes:     Pupils: Pupils are equal, round, and reactive to light.  Cardiovascular:     Rate and Rhythm: Normal rate and regular rhythm.     Heart sounds: No murmur heard. Pulmonary:     Effort: No respiratory distress.     Breath sounds: Normal breath sounds.  Musculoskeletal:     Cervical back: Normal range of motion and neck supple.  Neurological:     Mental Status: He is alert and oriented to person, place, and time.       Assessment & Plan:   Marvin Frost was seen today  for medical management of chronic issues.  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 pancreatitis   Baxinets [cetylpyridinium-benzocaine] Other (See Comments)   Unknown reaction   Celebrex [celecoxib] Nausea Only   Toprol Xl [metoprolol Succinate] Other (See Comments)   Unknown reaction - pt states it did not work   Triplex Ad Other (See Comments)  Abdominal pain and enlarged pancreas on CT? pancreatitis   Welchol [colesevelam Hcl] Other (See Comments)   constipation   Norvasc [amlodipine Besylate] Rash   Statins Other (See Comments)   muscle and joint pain if taken for long periods of time, takes Crestor every 2-3 days to avoid this reaction        Medication List        Accurate as of May 22, 2022  8:51 AM. If you have any questions, ask your nurse or doctor.          STOP taking these medications    amoxicillin 500 MG capsule Commonly known as: AMOXIL Stopped by: [name], MD   fexofenadine 180 MG tablet Commonly known as: ALLEGRA Stopped by: [name], MD   FreeStyle [facility] Stopped by: [name], MD       TAKE these medications    aspirin EC 81 MG tablet Take 1 tablet (81 mg total) by mouth daily.   cetirizine 10 MG tablet Commonly known as: ZYRTEC Take 10 mg by mouth as needed for allergies or rhinitis.   ferrous sulfate 325 (65 FE) MG tablet Take 1 tablet (325 mg total) by mouth every other day. What changed: when to take this   fluocinolone 0.01 % external solution Commonly known as: Synalar Apply topically 2 (two) times daily. To scalp   freestyle lancets Check BS BID and PRN. DX.E11.9    FreeStyle Libre 14 Day Sensor Misc Inject 1 each into the skin every 14 (fourteen) days. Use as directed.   insulin glargine 100 UNIT/ML injection Commonly known as: Lantus Inject 0.64 mLs (64 Units total) into the skin at bedtime.   Insulin Pen Needle 32G X 5 MM Misc by Does not apply route. Use with insulin pen needles up to 5 times daily  E11.9   Insulin Pen Needle 32G X 5 MM Misc Commonly known as: NovoTwist USE WITH INSULIN PENS UP TO 5 TIMES DAILY Dx E11.59   metFORMIN 750 MG 24 hr tablet Commonly known as: GLUCOPHAGE-XR Take 1 tablet (750 mg total) by mouth daily with breakfast.   metoprolol succinate 100 MG 24 hr tablet Commonly known as: TOPROL-XL TAKE 1 TABLET DAILY FOR BLOOD PRESSURE CONTROL   nitroGLYCERIN 0.4 MG SL tablet Commonly known as: NITROSTAT DISSOLVE 1 TABLET UNDER THE TONGUE EVERY 5 MINUTES AS NEEDED FOR CHEST PAIN   olmesartan-hydrochlorothiazide 40-25 MG tablet Commonly known as: Benicar HCT Take 1 tablet by mouth daily.   pantoprazole 40 MG tablet Commonly known as: PROTONIX Take 1 tablet (40 mg total) by mouth daily.   Repatha SureClick 865 MG/ML Soaj Generic drug: Evolocumab INJECT 140 MG UNDER THE SKIN EVERY 14 DAYS   rosuvastatin 40 MG tablet Commonly known as: CRESTOR Take 1 tablet (40 mg total) by mouth every 3 (three) days.   terbinafine 1 % cream Commonly known as: LAMISIL Apply 1 application topically 2 (two) times daily. What changed: additional instructions   Trulicity 4.5 HQ/4.6NG Sopn Generic drug: Dulaglutide INJECT 4.5 MG INTO THE SKIN ONCE A WEEK        No orders of the defined types were placed in this encounter.     Follow-up: Return in about 6 months (around 11/20/2022).  [name], M.D.

## 2022-05-23 LAB — CMP14+EGFR
ALT: 15 IU/L (ref 0–44)
AST: 13 IU/L (ref 0–40)
Albumin/Globulin Ratio: 1.6 (ref 1.2–2.2)
Albumin: 4.2 g/dL (ref 3.8–4.8)
Alkaline Phosphatase: 64 IU/L (ref 44–121)
BUN/Creatinine Ratio: 13 (ref 10–24)
BUN: 12 mg/dL (ref 8–27)
Bilirubin Total: 0.4 mg/dL (ref 0.0–1.2)
CO2: 22 mmol/L (ref 20–29)
Calcium: 9.8 mg/dL (ref 8.6–10.2)
Chloride: 97 mmol/L (ref 96–106)
Creatinine, Ser: 0.94 mg/dL (ref 0.76–1.27)
Globulin, Total: 2.6 g/dL (ref 1.5–4.5)
Glucose: 206 mg/dL — ABNORMAL HIGH (ref 70–99)
Potassium: 4.5 mmol/L (ref 3.5–5.2)
Sodium: 138 mmol/L (ref 134–144)
Total Protein: 6.8 g/dL (ref 6.0–8.5)
eGFR: 83 mL/min/{1.73_m2} (ref >59)

## 2022-05-23 LAB — CBC WITH DIFFERENTIAL/PLATELET
Basophils Absolute: 0.2 10*3/uL (ref 0.0–0.2)
Basos: 2 %
EOS (ABSOLUTE): 0.4 10*3/uL (ref 0.0–0.4)
Eos: 4 %
Hematocrit: 45.8 % (ref 37.5–51.0)
Hemoglobin: 15.3 g/dL (ref 13.0–17.7)
Immature Grans (Abs): 0.1 10*3/uL (ref 0.0–0.1)
Immature Granulocytes: 1 %
Lymphocytes Absolute: 1.5 10*3/uL (ref 0.7–3.1)
Lymphs: 17 %
MCH: 29.3 pg (ref 26.6–33.0)
MCHC: 33.4 g/dL (ref 31.5–35.7)
MCV: 88 fL (ref 79–97)
Monocytes Absolute: 0.7 10*3/uL (ref 0.1–0.9)
Monocytes: 8 %
Neutrophils Absolute: 5.7 10*3/uL (ref 1.4–7.0)
Neutrophils: 68 %
Platelets: 269 10*3/uL (ref 150–450)
RBC: 5.22 x10E6/uL (ref 4.14–5.80)
RDW: 13.1 % (ref 11.6–15.4)
WBC: 8.5 10*3/uL (ref 3.4–10.8)

## 2022-05-23 LAB — LIPID PANEL
Chol/HDL Ratio: 2.6 ratio (ref 0.0–5.0)
Cholesterol, Total: 119 mg/dL (ref 100–199)
HDL: 45 mg/dL (ref >39)
LDL Chol Calc (NIH): 45 mg/dL (ref 0–99)
Triglycerides: 173 mg/dL — ABNORMAL HIGH (ref 0–149)
VLDL Cholesterol Cal: 29 mg/dL (ref 5–40)

## 2022-05-23 NOTE — Progress Notes (Signed)
Hello Marvin Frost,  Your lab result is normal and/or stable.Some minor variations that are not significant are commonly marked abnormal, but do not represent any medical problem for you.  Best regards, Ondra Deboard, M.D.

## 2022-05-26 ENCOUNTER — Other Ambulatory Visit: Payer: Self-pay | Admitting: Family Medicine

## 2022-05-26 DIAGNOSIS — E1169 Type 2 diabetes mellitus with other specified complication: Secondary | ICD-10-CM

## 2022-05-26 DIAGNOSIS — Z8679 Personal history of other diseases of the circulatory system: Secondary | ICD-10-CM

## 2022-05-29 ENCOUNTER — Other Ambulatory Visit: Payer: Self-pay | Admitting: Nurse Practitioner

## 2022-05-29 MED ORDER — FREESTYLE LIBRE 14 DAY SENSOR MISC: 1.0000 | 2 refills | Status: DC

## 2022-06-07 ENCOUNTER — Encounter: Payer: Self-pay | Admitting: Family Medicine

## 2022-06-07 ENCOUNTER — Ambulatory Visit (INDEPENDENT_AMBULATORY_CARE_PROVIDER_SITE_OTHER): Payer: Medicare Other | Admitting: Family Medicine

## 2022-06-07 VITALS — BP 118/72 | HR 85 | Temp 97.1°F | Ht 68.0 in | Wt 200.2 lb

## 2022-06-07 DIAGNOSIS — J069 Acute upper respiratory infection, unspecified: Secondary | ICD-10-CM

## 2022-06-07 DIAGNOSIS — R051 Acute cough: Secondary | ICD-10-CM

## 2022-06-07 LAB — VERITOR FLU A/B WAIVED
Influenza A: NEGATIVE
Influenza B: NEGATIVE

## 2022-06-07 MED ORDER — FLUTICASONE PROPIONATE 50 MCG/ACT NA SUSP: 2.0000 | Freq: Every day | NASAL | 6 refills | Status: AC

## 2022-06-07 MED ORDER — GUAIFENESIN ER 600 MG PO TB12: 600.0000 mg | ORAL_TABLET | Freq: Two times a day (BID) | ORAL | 0 refills | Status: AC

## 2022-06-07 NOTE — Progress Notes (Signed)
Subjective:  Patient ID: Marvin Frost, male    DOB: Jan 11, 1946, 76 y.o.   MRN: [id number]  Patient Care Team: [name], MD as PCP - General (Family Medicine) Minus Breeding, MD as PCP - Cardiology (Cardiology) Paralee Cancel, MD as Consulting Physician (Orthopedic Surgery) Minus Breeding, MD as Consulting Physician (Cardiology) Cassandria Anger, MD as Consulting Physician (Endocrinology) [facility]   Chief Complaint:  Nasal Congestion, low fever , Cough, and Chills (X 2 days )   HPI: Marvin Frost is a 76 y.o. male presenting on 06/07/2022 for Nasal Congestion, low fever , Cough, and Chills (X 2 days )   Cough This is a new problem. The current episode started in the past 7 days (monday). The problem has been gradually worsening. The problem occurs every few hours. The cough is Productive of sputum. Associated symptoms include chills, a fever, headaches, myalgias, nasal congestion, postnasal drip, rhinorrhea and a sore throat. Pertinent negatives include no chest pain or shortness of breath. Nothing aggravates the symptoms. Treatments tried: OTC mucinex. The treatment provided no relief. There is no history of asthma, COPD, emphysema or pneumonia.       Relevant past medical, surgical, family, and social history reviewed and updated as indicated.  Allergies and medications reviewed and updated. Data reviewed: Chart in Epic.   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    Cataract    both removed   Cervical radiculopathy 09/14/2020   Diabetes mellitus 1995   Dysrhythmia    history of heart palpitations    Gastric polyps 09/18/2017   GERD (gastroesophageal reflux disease)    History of frequent urinary tract infections    History of hiatal hernia    Hyperlipemia    Hypertension    Obesity     Pancreatitis     Past Surgical History:  Procedure Laterality Date   CARDIAC CATHETERIZATION     2004 and 1999- done by Dr Percival Spanish    COLONOSCOPY  11/2003   diverticulosis, no polyps   CORONARY STENT INTERVENTION N/A 05/28/2018   Procedure: CORONARY STENT INTERVENTION;  Surgeon: Sherren Mocha, MD;  Location: [facility];  Service: Cardiovascular;  Laterality: N/A;   EYE SURGERY Bilateral    cateracts - dr Katy Fitch   HYDROCELE EXCISION / REPAIR     INTRAVASCULAR PRESSURE WIRE/FFR STUDY N/A 05/28/2018   Procedure: INTRAVASCULAR PRESSURE WIRE/FFR STUDY;  Surgeon: Sherren Mocha, MD;  Location: [facility];  Service: Cardiovascular;  Laterality: N/A;   JOINT REPLACEMENT  2017   both hips   left calf surgery      related to trauma of being torn    LEFT HEART CATH AND CORONARY ANGIOGRAPHY N/A 05/28/2018   Procedure: LEFT HEART CATH AND CORONARY ANGIOGRAPHY;  Surgeon: Sherren Mocha, MD;  Location: [facility];  Service: Cardiovascular;  Laterality: N/A;   left index finger arthrioscopic surgery      lymph node removed from right neck      right shoulder surgery     TOTAL HIP ARTHROPLASTY Right 03/16/2015   Procedure: RIGHT TOTAL HIP ARTHROPLASTY ANTERIOR APPROACH;  Surgeon: Paralee Cancel, MD;  Location: [facility];  Service: Orthopedics;  Laterality: Right;   TOTAL HIP ARTHROPLASTY Left 06/22/2015  Procedure: LEFT TOTAL HIP ARTHROPLASTY ANTERIOR APPROACH;  Surgeon: Paralee Cancel, MD;  Location: [facility];  Service: Orthopedics;  Laterality: Left;   UPPER GASTROINTESTINAL ENDOSCOPY     hiatal hernia 2cm, mild gastritis - 2012, multiple hyperplastic and fundic gland gastric polyps 2019    Social History   Socioeconomic History   Marital status: Married    Spouse name: [name]   Number of children: 1   Years of education: Some College   Highest education level: Associate degree: occupational, Hotel manager, or vocational program  Occupational History   Occupation: Programme researcher, broadcasting/film/video: [employer]    Comment: retired  Tobacco Use   Smoking status: Former    Packs/day: 1.00    Years: 10.00    Total pack years: 10.00    Types: Cigarettes    Quit date: 04/06/1984    Years since quitting: 38.1   Smokeless tobacco: Former    Types: Chew    Quit date: 06/26/1978   Tobacco comments:    I do not smoke  Vaping Use   Vaping Use: Never used  Substance and Sexual Activity   Alcohol use: No   Drug use: No   Sexual activity: Yes    Birth control/protection: None  Other Topics Concern   Not on file  Social History Narrative   Married   Retired from [employer]   1 child   Former but no current tobacco   No EtOH/drugs   Social Determinants of Radio broadcast assistant Strain: [place]  (07/28/2021)   Overall Financial Resource Strain (CARDIA)    Difficulty of Paying Living Expenses: Not hard at all  Food Insecurity: No [place] (07/28/2021)   Hunger Vital Sign    Worried About [place] in the Last Year: Never true    [place] in the Last Year: Never true  Transportation Needs: No Transportation Needs (07/28/2021)   PRAPARE - Hydrologist (Medical): No    Lack of Transportation (Non-Medical): No  Physical Activity: Insufficiently Active (07/28/2021)   Exercise Vital Sign    Days of Exercise per Week: 4 days    Minutes of Exercise per Session: 30 min  Stress: No Stress Concern Present (07/28/2021)   [place]    Feeling of Stress : Not at all  Social Connections: Moderately Integrated (07/28/2021)   Social Connection and Isolation Panel [NHANES]    Frequency of Communication with Friends and Family: Once a week    Frequency of Social Gatherings with Friends and Family: Once a week    Attends Religious Services: More than 4 times per year    Active Member of [employer] or Organizations: Yes    Attends Music therapist: More  than 4 times per year    Marital Status: Married  Human resources officer Violence: Not At Risk (07/28/2021)   Humiliation, Afraid, Rape, and Kick questionnaire    Fear of Current or Ex-Partner: No    Emotionally Abused: No    Physically Abused: No    Sexually Abused: No    Outpatient Encounter Medications as of 06/07/2022  Medication Sig   aspirin EC 81 MG EC tablet Take 1 tablet (81 mg total) by mouth daily.   cetirizine (ZYRTEC) 10 MG tablet Take 10 mg by mouth as needed for allergies or rhinitis.    Continuous Blood Gluc Sensor (FREESTYLE LIBRE 14 DAY SENSOR) MISC  Inject 1 each into the skin every 14 (fourteen) days. Use as directed.   Dulaglutide (TRULICITY) 4.5 ME/1.5AX SOPN INJECT 4.5 MG INTO THE SKIN ONCE A WEEK   Evolocumab (REPATHA SURECLICK) 094 MG/ML SOAJ INJECT 140 MG UNDER THE SKIN EVERY 14 DAYS   ferrous sulfate 325 (65 FE) MG tablet Take 1 tablet (325 mg total) by mouth every other day. (Patient taking differently: Take 325 mg by mouth every morning.)   fluocinolone (SYNALAR) 0.01 % external solution Apply topically 2 (two) times daily. To scalp   insulin glargine (LANTUS) 100 UNIT/ML injection Inject 0.64 mLs (64 Units total) into the skin at bedtime.   Insulin Pen Needle (NOVOTWIST) 32G X 5 MM MISC USE WITH INSULIN PENS UP TO 5 TIMES DAILY Dx E11.59   Insulin Pen Needle 32G X 5 MM MISC by Does not apply route. Use with insulin pen needles up to 5 times daily  E11.9   Lancets (FREESTYLE) lancets Check BS BID and PRN. DX.E11.9   metFORMIN (GLUCOPHAGE-XR) 750 MG 24 hr tablet Take 1 tablet (750 mg total) by mouth daily with breakfast.   metoprolol succinate (TOPROL-XL) 100 MG 24 hr tablet TAKE 1 TABLET DAILY FOR BLOOD PRESSURE CONTROL   nitroGLYCERIN (NITROSTAT) 0.4 MG SL tablet DISSOLVE 1 TABLET UNDER THE TONGUE EVERY 5 MINUTES AS NEEDED FOR CHEST PAIN   olmesartan-hydrochlorothiazide (BENICAR HCT) 40-25 MG tablet Take 1 tablet by mouth daily.   pantoprazole (PROTONIX) 40 MG tablet  Take 1 tablet (40 mg total) by mouth daily.   rosuvastatin (CRESTOR) 40 MG tablet Take 1 tablet (40 mg total) by mouth every 3 (three) days.   terbinafine (LAMISIL) 1 % cream Apply 1 application topically 2 (two) times daily. (Patient taking differently: Apply 1 application  topically 2 (two) times daily. As needed)   No facility-administered encounter medications on file as of 06/07/2022.    Allergies  Allergen Reactions   Brilinta [Ticagrelor]     Rash and SOB    Liraglutide Other (See Comments)    Abdominal pain and enlarged pancreas on CT ? pancreatitis   Baxinets [Cetylpyridinium-Benzocaine] Other (See Comments)    Unknown reaction   Celebrex [Celecoxib] Nausea Only   Toprol Xl [Metoprolol Succinate] Other (See Comments)    Unknown reaction - pt states it did not work   Triplex Ad Other (See Comments)    Abdominal pain and enlarged pancreas on CT? pancreatitis   Welchol [Colesevelam Hcl] Other (See Comments)    constipation   Norvasc [Amlodipine Besylate] Rash   Statins Other (See Comments)    muscle and joint pain if taken for long periods of time, takes Crestor every 2-3 days to avoid this reaction    Review of Systems  Constitutional:  Positive for chills, fatigue and fever. Negative for appetite change.  HENT:  Positive for postnasal drip, rhinorrhea and sore throat.   Respiratory:  Positive for cough. Negative for shortness of breath.   Cardiovascular:  Negative for chest pain and palpitations.  Gastrointestinal:  Negative for abdominal pain, nausea and vomiting.  Musculoskeletal:  Positive for myalgias.  Neurological:  Positive for headaches.  All other systems reviewed and are negative.       Objective:  BP 118/72   Pulse 85   Temp (!) 97.1 F (36.2 C) (Temporal)   Ht _0  (1.727 m)   Wt 200 lb 3.2 oz (90.8 kg)   SpO2 96%   BMI 30.44 kg/m    Wt Readings from Last 3  Encounters:  06/07/22 200 lb 3.2 oz (90.8 kg)  05/22/22 199 lb 9.6 oz (90.5 kg)   05/15/22 201 lb 3.2 oz (91.3 kg)    Physical Exam Vitals and nursing note reviewed.  Constitutional:      Appearance: Normal appearance.  HENT:     Head: Normocephalic and atraumatic.     Right Ear: Tympanic membrane normal.     Left Ear: Tympanic membrane normal.     Nose: Congestion and rhinorrhea present.     Mouth/Throat:     Mouth: Mucous membranes are moist.     Pharynx: Posterior oropharyngeal erythema present.  Eyes:     Conjunctiva/sclera: Conjunctivae normal.     Pupils: Pupils are equal, round, and reactive to light.  Neck:     Vascular: No carotid bruit.  Cardiovascular:     Rate and Rhythm: Normal rate and regular rhythm.     Pulses: Normal pulses.     Heart sounds: Normal heart sounds.  Pulmonary:     Effort: Pulmonary effort is normal. No respiratory distress.     Breath sounds: Normal breath sounds. No wheezing, rhonchi or rales.  Musculoskeletal:     Cervical back: No tenderness.  Lymphadenopathy:     Cervical: No cervical adenopathy.  Skin:    General: Skin is warm and dry.  Neurological:     General: No focal deficit present.     Mental Status: He is alert and oriented to person, place, and time.  Psychiatric:        Mood and Affect: Mood normal.        Behavior: Behavior normal.     Results for orders placed or performed in visit on 05/22/22  CBC with Differential/Platelet  Result Value Ref Range   WBC 8.5 3.4 - 10.8 x10E3/uL   RBC 5.22 4.14 - 5.80 x10E6/uL   Hemoglobin 15.3 13.0 - 17.7 g/dL   Hematocrit 45.8 37.5 - 51.0 %   MCV 88 79 - 97 fL   MCH 29.3 26.6 - 33.0 pg   MCHC 33.4 31.5 - 35.7 g/dL   RDW 13.1 11.6 - 15.4 %   Platelets 269 150 - 450 x10E3/uL   Neutrophils 68 Not Estab. %   Lymphs 17 Not Estab. %   Monocytes 8 Not Estab. %   Eos 4 Not Estab. %   Basos 2 Not Estab. %   Neutrophils Absolute 5.7 1.4 - 7.0 x10E3/uL   Lymphocytes Absolute 1.5 0.7 - 3.1 x10E3/uL   Monocytes Absolute 0.7 0.1 - 0.9 x10E3/uL   EOS (ABSOLUTE) 0.4  0.0 - 0.4 x10E3/uL   Basophils Absolute 0.2 0.0 - 0.2 x10E3/uL   Immature Granulocytes 1 Not Estab. %   Immature Grans (Abs) 0.1 0.0 - 0.1 x10E3/uL  CMP14+EGFR  Result Value Ref Range   Glucose 206 (H) 70 - 99 mg/dL   BUN 12 8 - 27 mg/dL   Creatinine, Ser 0.94 0.76 - 1.27 mg/dL   eGFR 83 >59 mL/min/1.73   BUN/Creatinine Ratio 13 10 - 24   Sodium 138 134 - 144 mmol/L   Potassium 4.5 3.5 - 5.2 mmol/L   Chloride 97 96 - 106 mmol/L   CO2 22 20 - 29 mmol/L   Calcium 9.8 8.6 - 10.2 mg/dL   Total Protein 6.8 6.0 - 8.5 g/dL   Albumin 4.2 3.8 - 4.8 g/dL   Globulin, Total 2.6 1.5 - 4.5 g/dL   Albumin/Globulin Ratio 1.6 1.2 - 2.2   Bilirubin Total 0.4 0.0 -  1.2 mg/dL   Alkaline Phosphatase 64 44 - 121 IU/L   AST 13 0 - 40 IU/L   ALT 15 0 - 44 IU/L  Lipid panel  Result Value Ref Range   Cholesterol, Total 119 100 - 199 mg/dL   Triglycerides 173 (H) 0 - 149 mg/dL   HDL 45 >39 mg/dL   VLDL Cholesterol Cal 29 5 - 40 mg/dL   LDL Chol Calc (NIH) 45 0 - 99 mg/dL   Chol/HDL Ratio 2.6 0.0 - 5.0 ratio       Pertinent labs & imaging results that were available during my care of the patient were reviewed by me and considered in my medical decision making.  Assessment & Plan:  Marvin Frost was seen today for nasal congestion, low fever , cough and chills.  Diagnoses and all orders for this visit:  URI with cough and congestion Force fluids Meds as prescribed -     guaiFENesin (MUCINEX) 600 MG 12 hr tablet; Take 1 tablet (600 mg total) by mouth 2 (two) times daily for 10 days. -     fluticasone (FLONASE) 50 MCG/ACT nasal spray; Place 2 sprays into both nostrils daily.  Acute cough Rapid Flu negative Send-off pending results; will add antibiotics if warranted. -     COVID-19, Flu A+B and RSV -     Veritor Flu A/B Waived      Continue all other maintenance medications.  Follow up plan: RTO for new or worsening symptoms or symptoms fail to improve   Continue healthy lifestyle choices,  including diet (rich in fruits, vegetables, and lean proteins, and low in salt and simple carbohydrates) and exercise (at least 30 minutes of moderate physical activity daily).   The above assessment and management plan was discussed with the patient. The patient verbalized understanding of and has agreed to the management plan. Patient is aware to call the clinic if they develop any new symptoms or if symptoms persist or worsen. Patient is aware when to return to the clinic for a follow-up visit. Patient educated on when it is appropriate to go to the emergency department.   Collene Leyden, FNP student  I personally was present during the history, physical exam, and medical decision-making activities of this visit and have verified that the services and findings are accurately documented in the nurse practitioner student's note.  Monia Pouch, FNP-C [facility] [address] [place], [place] [address] [phone]

## 2022-06-08 ENCOUNTER — Ambulatory Visit: Payer: Medicare Other

## 2022-06-08 LAB — COVID-19, FLU A+B AND RSV
Influenza A, NAA: NOT DETECTED
Influenza B, NAA: NOT DETECTED
RSV, NAA: NOT DETECTED
SARS-CoV-2, NAA: NOT DETECTED

## 2022-06-09 ENCOUNTER — Other Ambulatory Visit: Payer: Self-pay | Admitting: Family Medicine

## 2022-06-09 ENCOUNTER — Telehealth: Payer: Self-pay | Admitting: Family Medicine

## 2022-06-09 DIAGNOSIS — J069 Acute upper respiratory infection, unspecified: Secondary | ICD-10-CM

## 2022-06-09 MED ORDER — AMOXICILLIN-POT CLAVULANATE 875-125 MG PO TABS: 1.0000 | ORAL_TABLET | Freq: Two times a day (BID) | ORAL | 0 refills | Status: AC

## 2022-06-09 NOTE — Telephone Encounter (Signed)
Pt aware.

## 2022-06-09 NOTE — Telephone Encounter (Signed)
Pt called stating that he had a visit with Rakes the other day and was told to call her if symptoms didn't get any better and she would send in different medicine for him. Pt says he doesn't feel any better and does want something else sent in for him.  Says it says on his record that he can't take a zpak but pt says he has taken many zpaks in his life and it always helps if that is what Rakes wants to send in for him.

## 2022-07-02 ENCOUNTER — Other Ambulatory Visit: Payer: Self-pay | Admitting: Family Medicine

## 2022-07-31 ENCOUNTER — Ambulatory Visit (INDEPENDENT_AMBULATORY_CARE_PROVIDER_SITE_OTHER): Payer: Medicare Other

## 2022-07-31 VITALS — Ht 68.0 in | Wt 200.0 lb

## 2022-07-31 DIAGNOSIS — Z0000 Encounter for general adult medical examination without abnormal findings: Secondary | ICD-10-CM

## 2022-07-31 NOTE — Patient Instructions (Signed)
Marvin Frost , Thank you for taking time to come for your Medicare Wellness Visit. I appreciate your ongoing commitment to your health goals. Please review the following plan we discussed and let me know if I can assist you in the future.   These are the goals we discussed:  Goals      AWV     07/28/2021 AWV Goal: Diabetes Management  Patient will maintain an A1C level below 8.0 Patient will not develop any diabetic foot complications Patient will not experience any hypoglycemic episodes over the next 3 months Patient will notify our office of any CBG readings outside of the provider recommended range by calling [phone] Patient will adhere to provider recommendations for diabetes management  Patient Self Management Activities take all medications as prescribed and report any negative side effects monitor and record blood sugar readings as directed adhere to a low carbohydrate diet that incorporates lean proteins, vegetables, whole grains, low glycemic fruits check feet daily noting any sores, cracks, injuries, or callous formations see PCP or podiatrist if he notices any changes in his legs, feet, or toenails Patient will visit PCP and have an A1C level checked every 3 to 6 months as directed  have a yearly eye exam to monitor for vascular changes associated with diabetes and will request that the report be sent to his pcp.  consult with his PCP regarding any changes in his health or new or worsening symptoms      Exercise 3x per week (60 min per time)     Exercise with cardiac rehab and continue once program is finished.         This is a list of the screening recommended for you and due dates:  Health Maintenance  Topic Date Due   DTaP/Tdap/Td vaccine (3 - Td or Tdap) 04/26/2021   COVID-19 Vaccine (4 - 2023-24 season) 02/24/2022   Complete foot exam   07/06/2022   Zoster (Shingles) Vaccine (2 of 2) 08/22/2022*   Yearly kidney health urinalysis for diabetes  10/05/2022    Hemoglobin A1C  11/13/2022   Eye exam for diabetics  04/05/2023   Yearly kidney function blood test for diabetes  05/23/2023   Medicare Annual Wellness Visit  08/01/2023   Pneumonia Vaccine  Completed   Flu Shot  Completed   Hepatitis C Screening: USPSTF Recommendation to screen - Ages 18-79 yo.  Completed   HPV Vaccine  Aged Out  *Topic was postponed. The date shown is not the original due date.    Advanced directives: Please bring a copy of your health care power of attorney and living will to the office to be added to your chart at your convenience.   Conditions/risks identified: Aim for 30 minutes of exercise or brisk walking, 6-8 glasses of water, and 5 servings of fruits and vegetables each day.   Next appointment: Follow up in one year for your annual wellness visit.   Preventive Care 60 Years and Older, Male  Preventive care refers to lifestyle choices and visits with your health care provider that can promote health and wellness. What does preventive care include? A yearly physical exam. This is also called an annual well check. Dental exams once or twice a year. Routine eye exams. Ask your health care provider how often you should have your eyes checked. Personal lifestyle choices, including: Daily care of your teeth and gums. Regular physical activity. Eating a healthy diet. Avoiding tobacco and drug use. Limiting alcohol use. Practicing safe sex. Taking  low doses of aspirin every day. Taking vitamin and mineral supplements as recommended by your health care provider. What happens during an annual well check? The services and screenings done by your health care provider during your annual well check will depend on your age, overall health, lifestyle risk factors, and family history of disease. Counseling  Your health care provider may ask you questions about your: Alcohol use. Tobacco use. Drug use. Emotional well-being. Home and relationship well-being. Sexual  activity. Eating habits. History of falls. Memory and ability to understand (cognition). Work and work Statistician. Screening  You may have the following tests or measurements: Height, weight, and BMI. Blood pressure. Lipid and cholesterol levels. These may be checked every 5 years, or more frequently if you are over 58 years old. Skin check. Lung cancer screening. You may have this screening every year starting at age 81 if you have a 30-pack-year history of smoking and currently smoke or have quit within the past 15 years. Fecal occult blood test (FOBT) of the stool. You may have this test every year starting at age 50. Flexible sigmoidoscopy or colonoscopy. You may have a sigmoidoscopy every 5 years or a colonoscopy every 10 years starting at age 41. Prostate cancer screening. Recommendations will vary depending on your family history and other risks. Hepatitis C blood test. Hepatitis B blood test. Sexually transmitted disease (STD) testing. Diabetes screening. This is done by checking your blood sugar (glucose) after you have not eaten for a while (fasting). You may have this done every 1-3 years. Abdominal aortic aneurysm (AAA) screening. You may need this if you are a current or former smoker. Osteoporosis. You may be screened starting at age 63 if you are at high risk. Talk with your health care provider about your test results, treatment options, and if necessary, the need for more tests. Vaccines  Your health care provider may recommend certain vaccines, such as: Influenza vaccine. This is recommended every year. Tetanus, diphtheria, and acellular pertussis (Tdap, Td) vaccine. You may need a Td booster every 10 years. Zoster vaccine. You may need this after age 23. Pneumococcal 13-valent conjugate (PCV13) vaccine. One dose is recommended after age 48. Pneumococcal polysaccharide (PPSV23) vaccine. One dose is recommended after age 29. Talk to your health care provider about which  screenings and vaccines you need and how often you need them. This information is not intended to replace advice given to you by your health care provider. Make sure you discuss any questions you have with your health care provider. Document Released: 07/09/2015 Document Revised: 03/01/2016 Document Reviewed: 04/13/2015 Elsevier Interactive Patient Education  2017 [place] Prevention in the Home Falls can cause injuries. They can happen to people of all ages. There are many things you can do to make your home safe and to help prevent falls. What can I do on the outside of my home? Regularly fix the edges of walkways and driveways and fix any cracks. Remove anything that might make you trip as you walk through a door, such as a raised step or threshold. Trim any bushes or trees on the path to your home. Use bright outdoor lighting. Clear any walking paths of anything that might make someone trip, such as rocks or tools. Regularly check to see if handrails are loose or broken. Make sure that both sides of any steps have handrails. Any raised decks and porches should have guardrails on the edges. Have any leaves, snow, or ice cleared regularly. Use sand  or salt on walking paths during winter. Clean up any spills in your garage right away. This includes oil or grease spills. What can I do in the bathroom? Use night lights. Install grab bars by the toilet and in the tub and shower. Do not use towel bars as grab bars. Use non-skid mats or decals in the tub or shower. If you need to sit down in the shower, use a plastic, non-slip stool. Keep the floor dry. Clean up any water that spills on the floor as soon as it happens. Remove soap buildup in the tub or shower regularly. Attach bath mats securely with double-sided non-slip rug tape. Do not have throw rugs and other things on the floor that can make you trip. What can I do in the bedroom? Use night lights. Make sure that you have a  light by your bed that is easy to reach. Do not use any sheets or blankets that are too big for your bed. They should not hang down onto the floor. Have a firm chair that has side arms. You can use this for support while you get dressed. Do not have throw rugs and other things on the floor that can make you trip. What can I do in the kitchen? Clean up any spills right away. Avoid walking on wet floors. Keep items that you use a lot in easy-to-reach places. If you need to reach something above you, use a strong step stool that has a grab bar. Keep electrical cords out of the way. Do not use floor polish or wax that makes floors slippery. If you must use wax, use non-skid floor wax. Do not have throw rugs and other things on the floor that can make you trip. What can I do with my stairs? Do not leave any items on the stairs. Make sure that there are handrails on both sides of the stairs and use them. Fix handrails that are broken or loose. Make sure that handrails are as long as the stairways. Check any carpeting to make sure that it is firmly attached to the stairs. Fix any carpet that is loose or worn. Avoid having throw rugs at the top or bottom of the stairs. If you do have throw rugs, attach them to the floor with carpet tape. Make sure that you have a light switch at the top of the stairs and the bottom of the stairs. If you do not have them, ask someone to add them for you. What else can I do to help prevent falls? Wear shoes that: Do not have high heels. Have rubber bottoms. Are comfortable and fit you well. Are closed at the toe. Do not wear sandals. If you use a stepladder: Make sure that it is fully opened. Do not climb a closed stepladder. Make sure that both sides of the stepladder are locked into place. Ask someone to hold it for you, if possible. Clearly mark and make sure that you can see: Any grab bars or handrails. First and last steps. Where the edge of each step  is. Use tools that help you move around (mobility aids) if they are needed. These include: Canes. Walkers. Scooters. Crutches. Turn on the lights when you go into a dark area. Replace any light bulbs as soon as they burn out. Set up your furniture so you have a clear path. Avoid moving your furniture around. If any of your floors are uneven, fix them. If there are any pets around you, be aware  of where they are. Review your medicines with your doctor. Some medicines can make you feel dizzy. This can increase your chance of falling. Ask your doctor what other things that you can do to help prevent falls. This information is not intended to replace advice given to you by your health care provider. Make sure you discuss any questions you have with your health care provider. Document Released: 04/08/2009 Document Revised: 11/18/2015 Document Reviewed: 07/17/2014 Elsevier Interactive Patient Education  2017 [employer].

## 2022-07-31 NOTE — Progress Notes (Signed)
Subjective:   Marvin Frost is a 77 y.o. male who presents for Medicare Annual/Subsequent preventive examination.  I connected with  Marvin Frost on 07/31/22 by a audio enabled telemedicine application and verified that I am speaking with the correct person using two identifiers.  Patient Location: Home  Provider Location: Home Office  I discussed the limitations of evaluation and management by telemedicine. The patient expressed understanding and agreed to proceed.  Review of Systems     Cardiac Risk Factors include: advanced age (>38mn, >>12women);diabetes mellitus;dyslipidemia;male gender     Objective:    Today's Vitals   07/31/22 1711  Weight: 200 lb (90.7 kg)  Height: '5\' 8"'$  (1.727 m)   Body mass index is 30.41 kg/m.     07/31/2022    5:19 PM 07/28/2021    4:16 PM 07/26/2020   10:08 AM 07/24/2019    9:41 AM 07/22/2018   10:54 AM 05/27/2018    9:40 PM 07/26/2017    2:15 PM  Advanced Directives  Does Patient Have a Medical Advance Directive? Yes Yes Yes Yes Yes No Yes  Type of Advance Directive Living will [place] will Living will;Healthcare Power of [place]    Does patient want to make changes to medical advance directive? No - Patient declined  No - Patient declined No - Patient declined No - Patient declined    Copy of [place] Chart?  Yes - validated most recent copy scanned in chart (See row information) No - copy requested No - copy requested No - copy requested    Would patient like information on creating a medical advance directive?      No - Patient declined     Current Medications (verified) Outpatient Encounter Medications as of 07/31/2022  Medication Sig   aspirin EC 81 MG EC tablet Take 1 tablet (81 mg total) by mouth daily.   cetirizine (ZYRTEC) 10 MG tablet Take 10 mg by mouth as needed for allergies or rhinitis.    Continuous  Blood Gluc Sensor (FREESTYLE LIBRE 14 DAY SENSOR) MISC Inject 1 each into the skin every 14 (fourteen) days. Use as directed.   Dulaglutide (TRULICITY) 4.5 MMV/7.8IOSOPN INJECT 4.5 MG INTO THE SKIN ONCE A WEEK   Evolocumab (REPATHA SURECLICK) 1962MG/ML SOAJ INJECT 140 MG UNDER THE SKIN EVERY 14 DAYS   ferrous sulfate 325 (65 FE) MG tablet Take 1 tablet (325 mg total) by mouth every other day. (Patient taking differently: Take 325 mg by mouth every morning.)   fluocinolone (SYNALAR) 0.01 % external solution Apply topically 2 (two) times daily. To scalp   fluticasone (FLONASE) 50 MCG/ACT nasal spray Place 2 sprays into both nostrils daily.   insulin glargine (LANTUS) 100 UNIT/ML injection Inject 0.64 mLs (64 Units total) into the skin at bedtime.   Insulin Pen Needle (NOVOTWIST) 32G X 5 MM MISC USE WITH INSULIN PENS UP TO 5 TIMES DAILY Dx E11.59   Insulin Pen Needle 32G X 5 MM MISC by Does not apply route. Use with insulin pen needles up to 5 times daily  E11.9   Lancets (FREESTYLE) lancets Check BS BID and PRN. DX.E11.9   metFORMIN (GLUCOPHAGE-XR) 750 MG 24 hr tablet Take 1 tablet (750 mg total) by mouth daily with breakfast.   metoprolol succinate (TOPROL-XL) 100 MG 24 hr tablet TAKE 1 TABLET DAILY FOR BLOOD PRESSURE CONTROL   nitroGLYCERIN (NITROSTAT) 0.4 MG SL  tablet DISSOLVE 1 TABLET UNDER THE TONGUE EVERY 5 MINUTES AS NEEDED FOR CHEST PAIN   olmesartan-hydrochlorothiazide (BENICAR HCT) 40-25 MG tablet Take 1 tablet by mouth daily.   pantoprazole (PROTONIX) 40 MG tablet Take 1 tablet (40 mg total) by mouth daily.   rosuvastatin (CRESTOR) 40 MG tablet Take 1 tablet (40 mg total) by mouth every 3 (three) days.   terbinafine (LAMISIL) 1 % cream Apply 1 application topically 2 (two) times daily. (Patient taking differently: Apply 1 application  topically 2 (two) times daily. As needed)   No facility-administered encounter medications on file as of 07/31/2022.    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    Cataract    both removed   Cervical radiculopathy 09/14/2020   Diabetes mellitus 1995   Dysrhythmia    history of heart palpitations    Gastric polyps 09/18/2017   GERD (gastroesophageal reflux disease)    History of frequent urinary tract infections    History of hiatal hernia    Hyperlipemia    Hypertension    Obesity    Pancreatitis    Past Surgical History:  Procedure Laterality Date   CARDIAC CATHETERIZATION     2004 and 1999- done by Dr Percival Spanish    COLONOSCOPY  11/2003   diverticulosis, no polyps   CORONARY STENT INTERVENTION N/A 05/28/2018   Procedure: CORONARY STENT INTERVENTION;  Surgeon: Sherren Mocha, MD;  Location: [facility];  Service: Cardiovascular;  Laterality: N/A;   EYE SURGERY Bilateral    cateracts - dr Katy Fitch   HYDROCELE EXCISION / REPAIR     INTRAVASCULAR PRESSURE WIRE/FFR STUDY N/A 05/28/2018   Procedure: INTRAVASCULAR PRESSURE WIRE/FFR STUDY;  Surgeon: Sherren Mocha, MD;  Location: [facility];  Service: Cardiovascular;  Laterality: N/A;   JOINT REPLACEMENT  2017   both hips   left calf surgery      related to trauma of being torn    LEFT HEART CATH AND CORONARY ANGIOGRAPHY N/A 05/28/2018   Procedure: LEFT HEART CATH AND CORONARY ANGIOGRAPHY;  Surgeon: Sherren Mocha, MD;  Location: [facility];  Service: Cardiovascular;  Laterality: N/A;   left index finger arthrioscopic surgery      lymph node removed from right neck      right shoulder surgery     TOTAL HIP ARTHROPLASTY Right 03/16/2015   Procedure: RIGHT TOTAL HIP  ARTHROPLASTY ANTERIOR APPROACH;  Surgeon: Paralee Cancel, MD;  Location: [facility];  Service: Orthopedics;  Laterality: Right;   TOTAL HIP ARTHROPLASTY Left 06/22/2015   Procedure: LEFT TOTAL HIP ARTHROPLASTY ANTERIOR APPROACH;  Surgeon: Paralee Cancel, MD;  Location: [facility];  Service: Orthopedics;  Laterality: Left;   UPPER GASTROINTESTINAL ENDOSCOPY     hiatal hernia 2cm, mild gastritis - 2012, multiple hyperplastic and fundic gland gastric polyps 2019   Family History  Problem Relation Age of Onset   Heart disease Mother    Heart attack Mother    Diabetes Brother    Heart disease Brother    Heart attack Brother 26   Heart attack Father 24   Early death Father  Heart disease Father    GI problems Sister        torn esophagus during endoscopy   Healthy Daughter    Colon polyps Neg Hx    Esophageal cancer Neg Hx    Rectal cancer Neg Hx    Stomach cancer Neg Hx    Colon cancer Neg Hx    Social History   Socioeconomic History   Marital status: Married    Spouse name: [name]   Number of children: 1   Years of education: Some College   Highest education level: Associate degree: occupational, Hotel manager, or vocational program  Occupational History   Occupation: Best boy: [employer]    Comment: retired  Tobacco Use   Smoking status: Former    Packs/day: 1.00    Years: 10.00    Total pack years: 10.00    Types: Cigarettes    Quit date: 04/06/1984    Years since quitting: 38.3   Smokeless tobacco: Former    Types: Chew    Quit date: 06/26/1978   Tobacco comments:    I do not smoke  Vaping Use   Vaping Use: Never used  Substance and Sexual Activity   Alcohol use: No   Drug use: No   Sexual activity: Yes    Birth control/protection: None  Other Topics Concern   Not on file  Social History Narrative   Married   Retired from [employer]   1 child   Former but no current tobacco   No EtOH/drugs   Social Determinants of Radio broadcast assistant  Strain: [place]  (07/31/2022)   Overall Financial Resource Strain (CARDIA)    Difficulty of Paying Living Expenses: Not hard at all  Food Insecurity: No Food Insecurity (07/31/2022)   Hunger Vital Sign    Worried About Running Out of Food in the Last Year: Never true    [place] in the Last Year: Never true  Transportation Needs: No Transportation Needs (07/31/2022)   PRAPARE - Hydrologist (Medical): No    Lack of Transportation (Non-Medical): No  Physical Activity: Sufficiently Active (07/31/2022)   Exercise Vital Sign    Days of Exercise per Week: 5 days    Minutes of Exercise per Session: 30 min  Stress: No Stress Concern Present (07/31/2022)   [place]    Feeling of Stress : Not at all  Social Connections: [place] (07/31/2022)   Social Connection and Isolation Panel [NHANES]    Frequency of Communication with Friends and Family: More than three times a week    Frequency of Social Gatherings with Friends and Family: Three times a week    Attends Religious Services: More than 4 times per year    Active Member of Clubs or Organizations: Yes    Attends Music therapist: More than 4 times per year    Marital Status: Married    Tobacco Counseling Counseling given: Not Answered Tobacco comments: I do not smoke   Clinical Intake:  Pre-visit preparation completed: Yes  Pain : No/denies pain  Diabetes: Yes CBG done?: No Did pt. bring in CBG monitor from home?: No  How often do you need to have someone help you when you read instructions, pamphlets, or other written materials from your doctor or pharmacy?: 1 - Never  Diabetic?Yes  Nutrition Risk Assessment:  Has the patient had any N/V/D within the  last 2 months?  No  Does the patient have any non-healing wounds?  No  Has the patient had any unintentional weight loss or weight gain?  No   Diabetes:  Is  the patient diabetic?  Yes  If diabetic, was a CBG obtained today?  No  Did the patient bring in their glucometer from home?  No  How often do you monitor your CBG's? Libre .   Financial Strains and Diabetes Management:  Are you having any financial strains with the device, your supplies or your medication? No .  Does the patient want to be seen by Chronic Care Management for management of their diabetes?  No  Would the patient like to be referred to a Nutritionist or for Diabetic Management?  No   Diabetic Exams:  Diabetic Eye Exam: Completed 04/04/22 Diabetic Foot Exam: Completed 07/06/21   Interpreter Needed?: No  Information entered by :: Denman George LPN   Activities of Daily Living    07/31/2022    5:19 PM  In your present state of health, do you have any difficulty performing the following activities:  Hearing? 0  Vision? 0  Difficulty concentrating or making decisions? 0  Walking or climbing stairs? 0  Dressing or bathing? 0  Doing errands, shopping? 0  Preparing Food and eating ? N  Using the Toilet? N  In the past six months, have you accidently leaked urine? N  Do you have problems with loss of bowel control? N  Managing your Medications? N  Managing your Finances? N  Housekeeping or managing your Housekeeping? N    Patient Care Team: [name], MD as PCP - General (Family Medicine) Minus Breeding, MD as PCP - Cardiology (Cardiology) Paralee Cancel, MD as Consulting Physician (Orthopedic Surgery) Minus Breeding, MD as Consulting Physician (Cardiology) Cassandria Anger, MD as Consulting Physician (Endocrinology) [facility]  Indicate any recent Medical Services you may have received from other than [facility] providers in the past year (date may be approximate).     Assessment:   This is a routine wellness examination for Craig.  Hearing/Vision screen Hearing Screening - Comments:: Denies hearing difficulties  Vision Screening  - Comments:: Wears rx glasses - up to date with routine eye exams with [place] issues and exercise activities discussed: Current Exercise Habits: Home exercise routine, Type of exercise: walking, Time (Minutes): 30, Frequency (Times/Week): 5, Weekly Exercise (Minutes/Week): 150, Intensity: Mild   Goals Addressed   None   Depression Screen    06/07/2022    9:29 AM 05/22/2022    7:57 AM 11/16/2021    8:14 AM 08/18/2021    9:16 AM 07/28/2021    4:12 PM 05/12/2021   10:17 AM 02/08/2021   10:36 AM  PHQ 2/9 Scores  PHQ - 2 Score 0 0 0 0 0 0 0  PHQ- 9 Score      0     Fall Risk    07/31/2022    5:14 PM 06/07/2022    9:29 AM 05/22/2022    8:14 AM 05/22/2022    7:57 AM 11/16/2021    8:14 AM  Fall Risk   Falls in the past year? 0 0 0 0 0  Number falls in past yr: 0      Injury with Fall? 0      Follow up Falls prevention discussed;Education provided;Falls evaluation completed        FALL RISK PREVENTION PERTAINING TO THE HOME:  Any stairs in or around the home? Yes  If so, are there any without handrails? No  Home free of loose throw rugs in walkways, pet beds, electrical cords, etc? Yes  Adequate lighting in your home to reduce risk of falls? Yes   ASSISTIVE DEVICES UTILIZED TO PREVENT FALLS:  Life alert? No  Use of a cane, walker or w/c? No  Grab bars in the bathroom? Yes  Shower chair or bench in shower? No  Elevated toilet seat or a handicapped toilet? Yes   TIMED UP AND GO:  Was the test performed? No . Telephonic visit   Cognitive Function:    07/23/2018   10:43 AM 05/08/2017    8:56 AM 11/12/2015    8:59 AM 11/05/2014    8:34 AM  MMSE - Mini Mental State Exam  Orientation to time '5 5 5 5  '$ Orientation to Place '5 5 5 5  '$ Registration '3 3 3 3  '$ Attention/ Calculation '5 5 5 5  '$ Recall '3 3 3 3  '$ Language- name 2 objects '2 2 2 2  '$ Language- repeat '1 1 1 1  '$ Language- follow 3 step command '3 3 3 3  '$ Language- read & follow direction '1 1 1 1  '$ Write a  sentence '1 1 1 1  '$ Copy design '1 1 1 1  '$ Total score '30 30 30 30        '$ 07/31/2022    5:19 PM 07/26/2020   10:10 AM 07/24/2019    9:42 AM  6CIT Screen  What Year? 0 points 0 points 0 points  What month? 0 points 0 points 0 points  What time? 0 points 0 points 0 points  Count back from 20 0 points 0 points 0 points  Months in reverse 0 points 0 points 0 points  Repeat phrase 0 points 0 points 0 points  Total Score 0 points 0 points 0 points    Immunizations Immunization History  Administered Date(s) Administered   Fluad Quad(high Dose 65+) 03/26/2019, 04/19/2020, 05/12/2021, 03/30/2022   Influenza Whole 02/24/2010   Influenza, High Dose Seasonal PF 04/15/2015, 04/12/2016, 04/05/2017, 04/16/2018   Influenza,inj,Quad PF,6+ Mos 03/26/2013, 03/30/2014   Moderna Sars-Covid-2 Vaccination 07/31/2019, 08/28/2019, 03/15/2020   Pneumococcal Conjugate-13 06/17/2013   Pneumococcal Polysaccharide-23 02/24/2009   Td 02/24/2006   Tdap 04/27/2011   Zoster Recombinat (Shingrix) 07/22/2018   Zoster, Live 07/27/2006    TDAP status: Due, Education has been provided regarding the importance of this vaccine. Advised may receive this vaccine at local pharmacy or Health Dept. Aware to provide a copy of the vaccination record if obtained from local pharmacy or Health Dept. Verbalized acceptance and understanding.  Flu Vaccine status: Up to date  Pneumococcal vaccine status: Up to date  Covid-19 vaccine status: Information provided on how to obtain vaccines.   Qualifies for Shingles Vaccine? Yes   Zostavax completed No   Shingrix Completed?: No.    Education has been provided regarding the importance of this vaccine. Patient has been advised to call insurance company to determine out of pocket expense if they have not yet received this vaccine. Advised may also receive vaccine at local pharmacy or Health Dept. Verbalized acceptance and understanding.  Screening Tests Health Maintenance  Topic Date  Due   DTaP/Tdap/Td (3 - Td or Tdap) 04/26/2021   COVID-19 Vaccine (4 - 2023-24 season) 02/24/2022   FOOT EXAM  07/06/2022   Zoster Vaccines- Shingrix (2 of 2) 08/22/2022 (Originally 09/16/2018)   Diabetic kidney evaluation - Urine  ACR  10/05/2022   HEMOGLOBIN A1C  11/13/2022   OPHTHALMOLOGY EXAM  04/05/2023   Diabetic kidney evaluation - eGFR measurement  05/23/2023   Medicare Annual Wellness (AWV)  08/01/2023   Pneumonia Vaccine 11+ Years old  Completed   INFLUENZA VACCINE  Completed   Hepatitis C Screening  Completed   HPV VACCINES  Aged Out    Health Maintenance  Health Maintenance Due  Topic Date Due   DTaP/Tdap/Td (3 - Td or Tdap) 04/26/2021   COVID-19 Vaccine (4 - 2023-24 season) 02/24/2022   FOOT EXAM  07/06/2022    Colorectal cancer screening: No longer required.   Lung Cancer Screening: (Low Dose CT Chest recommended if Age 15-80 years, 30 pack-year currently smoking OR have quit w/in 15years.) does not qualify.   Lung Cancer Screening Referral: n/a   Additional Screening:  Hepatitis C Screening: does qualify; Completed 11/12/15  Vision Screening: Recommended annual ophthalmology exams for early detection of glaucoma and other disorders of the eye. Is the patient up to date with their annual eye exam?  Yes  Who is the provider or what is the name of the office in which the patient attends annual eye exams? [facility]  If pt is not established with a provider, would they like to be referred to a provider to establish care? No .   Dental Screening: Recommended annual dental exams for proper oral hygiene  Community Resource Referral / Chronic Care Management: CRR required this visit?  No   CCM required this visit?  No      Plan:     I have personally reviewed and noted the following in the patient's chart:   Medical and social history Use of alcohol, tobacco or illicit drugs  Current medications and supplements including opioid prescriptions. Patient is  not currently taking opioid prescriptions. Functional ability and status Nutritional status Physical activity Advanced directives List of other physicians Hospitalizations, surgeries, and ER visits in previous 12 months Vitals Screenings to include cognitive, depression, and falls Referrals and appointments  In addition, I have reviewed and discussed with patient certain preventive protocols, quality metrics, and best practice recommendations. A written personalized care plan for preventive services as well as general preventive health recommendations were provided to patient.     Vanetta Mulders, [place]   09/25/7060   Due to this being a virtual visit, the after visit summary with patients personalized plan was offered to patient via mail or my-chart.  Patient would like to access on my-chart  Nurse Notes: No concerns

## 2022-08-01 ENCOUNTER — Telehealth: Payer: Self-pay | Admitting: Nurse Practitioner

## 2022-08-02 NOTE — Telephone Encounter (Signed)
Patient was called and made aware. 

## 2022-08-02 NOTE — Telephone Encounter (Signed)
Ok, lets try the Lantus first.  Lets increase it to an even 70 units and see how his glucose responds.

## 2022-08-02 NOTE — Telephone Encounter (Signed)
Talked with the patient and shared your recommendations. He states that he will do either , whatever you would like for him to do. He did mention that he does have the Lantus on hand, and the Glipizide would have to be sent in and he would have to wait to get it. He states that his prescriptions will have to go through [facility].  Patient will be called with update.

## 2022-08-02 NOTE — Telephone Encounter (Signed)
See telephone note.

## 2022-09-13 ENCOUNTER — Encounter: Payer: Self-pay | Admitting: Nurse Practitioner

## 2022-09-13 ENCOUNTER — Ambulatory Visit (INDEPENDENT_AMBULATORY_CARE_PROVIDER_SITE_OTHER): Payer: Medicare Other | Admitting: Nurse Practitioner

## 2022-09-13 VITALS — BP 128/83 | HR 76 | Ht 68.0 in | Wt 199.6 lb

## 2022-09-13 DIAGNOSIS — I10 Essential (primary) hypertension: Secondary | ICD-10-CM

## 2022-09-13 DIAGNOSIS — E1159 Type 2 diabetes mellitus with other circulatory complications: Secondary | ICD-10-CM

## 2022-09-13 DIAGNOSIS — E782 Mixed hyperlipidemia: Secondary | ICD-10-CM

## 2022-09-13 LAB — POCT GLYCOSYLATED HEMOGLOBIN (HGB A1C): Hemoglobin A1C: 9.2 % — AB (ref 4.0–5.6)

## 2022-09-13 MED ORDER — METFORMIN HCL ER 500 MG PO TB24: 1000.0000 mg | ORAL_TABLET | Freq: Every day | ORAL | 3 refills | Status: DC

## 2022-09-13 MED ORDER — INSULIN GLARGINE 100 UNIT/ML ~~LOC~~ SOLN: 80.0000 [IU] | Freq: Every day | SUBCUTANEOUS | 3 refills | Status: DC

## 2022-09-13 NOTE — Progress Notes (Signed)
09/13/2022, 3:56 PM     Endocrinology follow-up note   Subjective:    Patient ID: Marvin Frost, male    DOB: 04/02/1946.   Marvin Frost is being seen in follow-up after he was seen in consultation for management of currently uncontrolled symptomatic diabetes. -He also has dyslipidemia, hypertension. PMD:   [name], MD.   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    Cataract    both removed   Cervical radiculopathy 09/14/2020   Diabetes mellitus 1995   Dysrhythmia    history of heart palpitations    Gastric polyps 09/18/2017   GERD (gastroesophageal reflux disease)    History of frequent urinary tract infections    History of hiatal hernia    Hyperlipemia    Hypertension    Obesity    Pancreatitis     Past Surgical History:  Procedure Laterality Date   CARDIAC CATHETERIZATION     2004 and 1999- done by Dr Percival Spanish    COLONOSCOPY  11/2003   diverticulosis, no polyps   CORONARY STENT INTERVENTION N/A 05/28/2018   Procedure: CORONARY STENT INTERVENTION;  Surgeon: Sherren Mocha, MD;  Location: [facility];  Service: Cardiovascular;  Laterality: N/A;   EYE SURGERY Bilateral    cateracts - dr Katy Fitch   HYDROCELE EXCISION / REPAIR     INTRAVASCULAR PRESSURE WIRE/FFR STUDY N/A 05/28/2018   Procedure: INTRAVASCULAR PRESSURE WIRE/FFR STUDY;  Surgeon: Sherren Mocha, MD;  Location: [facility];  Service: Cardiovascular;  Laterality: N/A;   JOINT REPLACEMENT  2017   both hips   left calf surgery      related to trauma of being torn    LEFT HEART CATH AND CORONARY ANGIOGRAPHY N/A 05/28/2018   Procedure: LEFT HEART CATH AND CORONARY ANGIOGRAPHY;  Surgeon: Sherren Mocha, MD;  Location: [facility];  Service:  Cardiovascular;  Laterality: N/A;   left index finger arthrioscopic surgery      lymph node removed from right neck      right shoulder surgery     TOTAL HIP ARTHROPLASTY Right 03/16/2015   Procedure: RIGHT TOTAL HIP ARTHROPLASTY ANTERIOR APPROACH;  Surgeon: Paralee Cancel, MD;  Location: [facility];  Service: Orthopedics;  Laterality: Right;   TOTAL HIP ARTHROPLASTY Left 06/22/2015   Procedure: LEFT TOTAL HIP ARTHROPLASTY ANTERIOR APPROACH;  Surgeon: Paralee Cancel, MD;  Location: [facility];  Service: Orthopedics;  Laterality: Left;   UPPER GASTROINTESTINAL ENDOSCOPY     hiatal hernia 2cm, mild gastritis - 2012, multiple hyperplastic and fundic gland gastric polyps 2019    Social History   Socioeconomic History   Marital status: Married    Spouse name: [name]   Number of children: 1   Years of education: Some College   Highest education level: Associate degree: occupational, Hotel manager, or vocational program  Occupational History   Occupation: Best boy:  [employer]    Comment: retired  Tobacco Use   Smoking status: Former    Packs/day: 1.00    Years: 10.00    Additional pack years: 0.00    Total pack years: 10.00    Types: Cigarettes    Quit date: 04/06/1984    Years since quitting: 38.4   Smokeless tobacco: Former    Types: Chew    Quit date: 06/26/1978   Tobacco comments:    I do not smoke  Vaping Use   Vaping Use: Never used  Substance and Sexual Activity   Alcohol use: No   Drug use: No   Sexual activity: Yes    Birth control/protection: None  Other Topics Concern   Not on file  Social History Narrative   Married   Retired from [employer]   1 child   Former but no current tobacco   No EtOH/drugs   Social Determinants of Radio broadcast assistant Strain: Low Risk  (07/31/2022)   Overall Financial Resource Strain (CARDIA)    Difficulty of Paying Living Expenses: Not hard at all  Food Insecurity: No Food Insecurity (07/31/2022)   Hunger Vital Sign     Worried About Running Out of Food in the Last Year: Never true    [place] in the Last Year: Never true  Transportation Needs: No Transportation Needs (07/31/2022)   PRAPARE - Hydrologist (Medical): No    Lack of Transportation (Non-Medical): No  Physical Activity: Sufficiently Active (07/31/2022)   Exercise Vital Sign    Days of Exercise per Week: 5 days    Minutes of Exercise per Session: 30 min  Stress: No Stress Concern Present (07/31/2022)   [place]    Feeling of Stress : Not at all  Social Connections: [place] (07/31/2022)   Social Connection and Isolation Panel [NHANES]    Frequency of Communication with Friends and Family: More than three times a week    Frequency of Social Gatherings with Friends and Family: Three times a week    Attends Religious Services: More than 4 times per year    Active Member of Clubs or Organizations: Yes    Attends Music therapist: More than 4 times per year    Marital Status: Married    Family History  Problem Relation Age of Onset   Heart disease Mother    Heart attack Mother    Diabetes Brother    Heart disease Brother    Heart attack Brother 76   Heart attack Father 69   Early death Father    Heart disease Father    GI problems Sister        torn esophagus during endoscopy   Healthy Daughter    Colon polyps Neg Hx    Esophageal cancer Neg Hx    Rectal cancer Neg Hx    Stomach cancer Neg Hx    Colon cancer Neg Hx     Outpatient Encounter Medications as of 09/13/2022  Medication Sig   aspirin EC 81 MG EC tablet Take 1 tablet (81 mg total) by mouth daily.   cetirizine (ZYRTEC) 10 MG tablet Take 10 mg by mouth as needed for allergies or rhinitis.    Continuous Blood Gluc Sensor (FREESTYLE LIBRE 14 DAY SENSOR) MISC Inject 1 each into the skin every 14 (fourteen) days. Use as directed.   Dulaglutide (TRULICITY) 4.5  0000000 SOPN  INJECT 4.5 MG INTO THE SKIN ONCE A WEEK   Evolocumab (REPATHA SURECLICK) [id number] MG/ML SOAJ INJECT 140 MG UNDER THE SKIN EVERY 14 DAYS   ferrous sulfate 325 (65 FE) MG tablet Take 1 tablet (325 mg total) by mouth every other day. (Patient taking differently: Take 325 mg by mouth every morning.)   fluocinolone (SYNALAR) 0.01 % external solution Apply topically 2 (two) times daily. To scalp   fluticasone (FLONASE) 50 MCG/ACT nasal spray Place 2 sprays into both nostrils daily.   Insulin Pen Needle (NOVOTWIST) 32G X 5 MM MISC USE WITH INSULIN PENS UP TO 5 TIMES DAILY Dx E11.59   Insulin Pen Needle 32G X 5 MM MISC by Does not apply route. Use with insulin pen needles up to 5 times daily  E11.9   Lancets (FREESTYLE) lancets Check BS BID and PRN. DX.E11.9   metFORMIN (GLUCOPHAGE-XR) 500 MG 24 hr tablet Take 2 tablets (1,000 mg total) by mouth daily with breakfast.   metoprolol succinate (TOPROL-XL) 100 MG 24 hr tablet TAKE 1 TABLET DAILY FOR BLOOD PRESSURE CONTROL   nitroGLYCERIN (NITROSTAT) 0.4 MG SL tablet DISSOLVE 1 TABLET UNDER THE TONGUE EVERY 5 MINUTES AS NEEDED FOR CHEST PAIN   olmesartan-hydrochlorothiazide (BENICAR HCT) 40-25 MG tablet Take 1 tablet by mouth daily.   pantoprazole (PROTONIX) 40 MG tablet Take 1 tablet (40 mg total) by mouth daily.   rosuvastatin (CRESTOR) 40 MG tablet Take 1 tablet (40 mg total) by mouth every 3 (three) days.   terbinafine (LAMISIL) 1 % cream Apply 1 application topically 2 (two) times daily. (Patient taking differently: Apply 1 application  topically 2 (two) times daily. As needed)   [DISCONTINUED] insulin glargine (LANTUS) 100 UNIT/ML injection Inject 0.64 mLs (64 Units total) into the skin at bedtime.   [DISCONTINUED] metFORMIN (GLUCOPHAGE-XR) 750 MG 24 hr tablet Take 1 tablet (750 mg total) by mouth daily with breakfast.   insulin glargine (LANTUS) 100 UNIT/ML injection Inject 0.8 mLs (80 Units total) into the skin at bedtime.   No  facility-administered encounter medications on file as of 09/13/2022.    ALLERGIES: Allergies  Allergen Reactions   Brilinta [Ticagrelor]     Rash and SOB    Liraglutide Other (See Comments)    Abdominal pain and enlarged pancreas on CT ? pancreatitis   Baxinets [Cetylpyridinium-Benzocaine] Other (See Comments)    Unknown reaction   Celebrex [Celecoxib] Nausea Only   Toprol Xl [Metoprolol Succinate] Other (See Comments)    Unknown reaction - pt states it did not work   Triplex Ad Other (See Comments)    Abdominal pain and enlarged pancreas on CT? pancreatitis   Welchol [Colesevelam Hcl] Other (See Comments)    constipation   Norvasc [Amlodipine Besylate] Rash   Statins Other (See Comments)    muscle and joint pain if taken for long periods of time, takes Crestor every 2-3 days to avoid this reaction    VACCINATION STATUS: Immunization History  Administered Date(s) Administered   Fluad Quad(high Dose 65+) 03/26/2019, 04/19/2020, 05/12/2021, 03/30/2022   Influenza Whole 02/24/2010   Influenza, High Dose Seasonal PF 04/15/2015, 04/12/2016, 04/05/2017, 04/16/2018   Influenza,inj,Quad PF,6+ Mos 03/26/2013, 03/30/2014   Moderna Sars-Covid-2 Vaccination 07/31/2019, 08/28/2019, 03/15/2020   Pneumococcal Conjugate-13 06/17/2013   Pneumococcal Polysaccharide-23 02/24/2009   Td 02/24/2006   Tdap 04/27/2011   Zoster Recombinat (Shingrix) 07/22/2018   Zoster, Live 07/27/2006    Diabetes He presents for his follow-up diabetic visit. He has type 2 diabetes mellitus. Onset time: He  was diagnosed at approximate age of 1 years. His disease course has been worsening. There are no hypoglycemic associated symptoms. Pertinent negatives for hypoglycemia include no confusion, headaches, pallor or seizures. Pertinent negatives for diabetes include no chest pain, no fatigue, no polydipsia, no polyphagia, no polyuria, no weakness and no weight loss. There are no hypoglycemic complications. Symptoms are  stable. Diabetic complications include heart disease and nephropathy. Risk factors for coronary artery disease include diabetes mellitus, dyslipidemia, family history, male sex, hypertension, sedentary lifestyle, tobacco exposure and obesity. Current diabetic treatment includes oral agent (monotherapy) and insulin injections (Trulicity). He is compliant with treatment most of the time. His weight is fluctuating minimally. He is following a generally healthy diet. When asked about meal planning, he reported none. He has not had a previous visit with a dietitian. He participates in exercise intermittently. His home blood glucose trend is increasing steadily. His overall blood glucose range is >200 mg/dl. (He presents today with his CGM and logs showing steadily worsening glycemic profile.  His POCT A1c today is 9.2%, increasing from last visit of 8.6%.  He notes he has been sick on and off since last visit.  He also admits he still has a soda from time to time but has really been watching what he eats.  He complains of constipation as well.  Analysis of his CGM shows TIR 17%, TAR 83%, TBR 0% with a GMI of 8.9%.  He denies any hypoglycemia.) An ACE inhibitor/angiotensin II receptor blocker is being taken. He does not see a podiatrist.Eye exam is current.  Hyperlipidemia This is a chronic problem. The current episode started more than 1 year ago. The problem is controlled. Recent lipid tests were reviewed and are normal. Exacerbating diseases include chronic renal disease, diabetes and obesity. Factors aggravating his hyperlipidemia include beta blockers. Pertinent negatives include no chest pain, myalgias or shortness of breath. Current antihyperlipidemic treatment includes statins (Repatha every 2 weeks. Only takes his statin once every few days dt myalgias). The current treatment provides moderate improvement of lipids. Compliance problems include medication side effects.  Risk factors for coronary artery disease  include male sex, obesity, family history, dyslipidemia, diabetes mellitus, hypertension and a sedentary lifestyle.  Hypertension This is a chronic problem. The current episode started more than 1 year ago. The problem has been gradually improving since onset. The problem is controlled. Pertinent negatives include no chest pain, headaches, neck pain, palpitations or shortness of breath. There are no associated agents to hypertension. Risk factors for coronary artery disease include dyslipidemia, diabetes mellitus, family history, male gender, obesity, sedentary lifestyle and smoking/tobacco exposure. Past treatments include angiotensin blockers, diuretics and beta blockers. The current treatment provides moderate improvement. There are no compliance problems.  Hypertensive end-organ damage includes CAD/MI. Identifiable causes of hypertension include chronic renal disease.    Review of systems  Constitutional: + Minimally fluctuating body weight,  current Body mass index is 30.35 kg/m. , no fatigue, no subjective hyperthermia, no subjective hypothermia Eyes: no blurry vision, no xerophthalmia ENT: no sore throat, no nodules palpated in throat, no dysphagia/odynophagia, no hoarseness Cardiovascular: no chest pain, no shortness of breath, no palpitations, no leg swelling Respiratory: no cough, no shortness of breath Gastrointestinal: no nausea/vomiting/diarrhea Musculoskeletal: no muscle/joint aches Skin: no rashes, no hyperemia Neurological: no tremors, no numbness, no tingling, no dizziness Psychiatric: no depression, no anxiety   Objective:    BP 128/83 (BP Location: Left Arm, Patient Position: Sitting, Cuff Size: Large)   Pulse 76  Ht 5\' 8"  (1.727 m)   Wt 199 lb 9.6 oz (90.5 kg)   BMI 30.35 kg/m   Wt Readings from Last 3 Encounters:  09/13/22 199 lb 9.6 oz (90.5 kg)  07/31/22 200 lb (90.7 kg)  06/07/22 200 lb 3.2 oz (90.8 kg)    BP Readings from Last 3 Encounters:  09/13/22 128/83   06/07/22 118/72  05/22/22 (!) 114/59     Physical Exam- Limited  Constitutional:  Body mass index is 30.35 kg/m. , not in acute distress, normal state of mind Eyes:  EOMI, no exophthalmos Musculoskeletal: no gross deformities, strength intact in all four extremities, no gross restriction of joint movements Skin:  no rashes, no hyperemia Neurological: no tremor with outstretched hands   Diabetic Foot Exam - Simple   No data filed    CMP     Component Value Date/Time   NA 138 05/22/2022 0855   K 4.5 05/22/2022 0855   CL 97 05/22/2022 0855   CO2 22 05/22/2022 0855   GLUCOSE 206 (H) 05/22/2022 0855   GLUCOSE 154 (H) 05/29/2018 0217   BUN 12 05/22/2022 0855   CREATININE 0.94 05/22/2022 0855   CREATININE 0.95 12/31/2012 0857   CALCIUM 9.8 05/22/2022 0855   PROT 6.8 05/22/2022 0855   ALBUMIN 4.2 05/22/2022 0855   AST 13 05/22/2022 0855   ALT 15 05/22/2022 0855   ALKPHOS 64 05/22/2022 0855   BILITOT 0.4 05/22/2022 0855   GFRNONAA 68 07/07/2020 1028   GFRNONAA 81 12/31/2012 0857   GFRAA 78 07/07/2020 1028   GFRAA >89 12/31/2012 0857    Diabetic Labs (most recent): Lab Results  Component Value Date   HGBA1C 9.2 (A) 09/13/2022   HGBA1C 8.6 (A) 05/15/2022   HGBA1C 7.7 (H) 11/16/2021   MICROALBUR 30 10/04/2021   MICROALBUR 10 12/30/2020   MICROALBUR 20 01/26/2015     Lipid Panel ( most recent) Lipid Panel     Component Value Date/Time   CHOL 119 05/22/2022 0855   CHOL 157 12/31/2012 0857   TRIG 173 (H) 05/22/2022 0855   TRIG 271 (H) 02/17/2016 0824   TRIG 184 (H) 12/31/2012 0857   HDL 45 05/22/2022 0855   HDL 44 02/17/2016 0824   HDL 45 12/31/2012 0857   CHOLHDL 2.6 05/22/2022 0855   LDLCALC 45 05/22/2022 0855   LDLCALC 52 01/19/2014 1007   LDLCALC 75 12/31/2012 0857   LDLDIRECT 39 11/02/2016 0806      Lab Results  Component Value Date   TSH 3.350 07/07/2020   TSH 2.720 12/30/2019   TSH 2.290 09/03/2014   FREET4 1.42 07/07/2020     Assessment &  Plan:   1) DM type 2 causing vascular disease ([place])  -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  - Marvin Frost has currently uncontrolled symptomatic type 2 DM since  77 years of age.  He presents today with his CGM and logs showing steadily worsening glycemic profile.  His POCT A1c today is 9.2%, increasing from last visit of 8.6%.  He notes he has been sick on and off since last visit.  He also admits he still has a soda from time to time but has really been watching what he eats.  He complains of constipation as well.  Analysis of his CGM shows TIR 17%, TAR 83%, TBR 0% with a GMI of 8.9%.  He denies any hypoglycemia.  -I have reviewed his recent labs with him.  - I had a  long discussion with him about the progressive nature of diabetes and the pathology behind its complications. -his diabetes is complicated by coronary artery disease, obesity and he remains at a high risk for more acute and chronic complications which include CAD, CVA, CKD, retinopathy, and neuropathy. These are all discussed in detail with him.  The following Lifestyle Medicine recommendations according to [facility]) were discussed and offered to patient and he agrees to start the journey:  A. Whole Foods, Plant-based plate comprising of fruits and vegetables, plant-based proteins, whole-grain carbohydrates was discussed in detail with the patient.   A list for source of those nutrients were also provided to the patient.  Patient will use only water or unsweetened tea for hydration. B.  The need to stay away from risky substances including alcohol, smoking; obtaining 7 to 9 hours of restorative sleep, at least 150 minutes of moderate intensity exercise weekly, the importance of healthy social connections,  and stress reduction techniques were discussed. C.  A full color page of  Calorie density of various food groups per pound showing examples of each food  groups was provided to the patient.  - Nutritional counseling repeated at each appointment due to patients tendency to fall back in to old habits.  - The patient admits there is a room for improvement in their diet and drink choices. -  Suggestion is made for the patient to avoid simple carbohydrates from their diet including Cakes, Sweet Desserts / Pastries, Ice Cream, Soda (diet and regular), Sweet Tea, Candies, Chips, Cookies, Sweet Pastries, Store Bought Juices, Alcohol in Excess of 1-2 drinks a day, Artificial Sweeteners, Coffee Creamer, and "Sugar-free" Products. This will help patient to have stable blood glucose profile and potentially avoid unintended weight gain.   - I encouraged the patient to switch to unprocessed or minimally processed complex starch and increased protein intake (animal or plant source), fruits, and vegetables.   - Patient is advised to stick to a routine mealtimes to eat 3 meals a day and avoid unnecessary snacks (to snack only to correct hypoglycemia).  - I have approached him with the following individualized plan to manage diabetes and patient agrees:   -He is advised to increase Lantus to 80 units SQ nightly, increase Metformin to 1000 mg ER daily with breakfast, and continue Trulicity 4.5 mg SQ weekly.  I encouraged him to add more fibrous fruits and veggies to his diet and eat more of a well balanced supper as he has been eating a lighter meal.  -He is encouraged to continue monitoring blood glucose using his CGM at least 4 times per day, before meals and at bedtime and call the clinic if he gets readings less than 70 or greater than 200 for 3 tests in a row.  - Patient specific target  A1c;  LDL, HDL, Triglycerides,  were discussed in detail.  2) Blood Pressure /Hypertension:   His blood pressure is controlled to target.  He is advised to continue Metoprolol 100 mg po daily and Benicar HCT 40-25 mg po daily  3) Lipids/Hyperlipidemia: His most recent lipid  panel from 05/22/22 shows controlled LDL of 45 and elevated triglycerides of 173 (improving) . He is statin intolerant with myalgias.  He has tolerated taking his Crestor every 2-3 days.  He is advised to continue taking his Crestor 40 mg po every 3 days or so and continue his Repatha 140 mg SQ every 2 weeks.  Side effects and precautions discussed with  him.    4)  Weight/Diet:  His Body mass index is 30.35 kg/m.--   He has lost some weight over the last few visits and will benefit from continued weight loss.  I discussed with him the fact that loss of 5 - 10% of his  current body weight will have the most impact on his diabetes management.  CDE Consult will be initiated . Exercise, and detailed carbohydrates information provided  -  detailed on discharge instructions.  5) Chronic Care/Health Maintenance: -he is on ACEI/ARB and Statin medications and is encouraged to initiate and continue to follow up with Ophthalmology, Dentist, Podiatrist at least yearly or according to recommendations, and advised to  stay away from smoking. I have recommended yearly flu vaccine and pneumonia vaccine at least every 5 years; moderate intensity exercise for up to 150 minutes weekly; and  sleep for at least 7 hours a day.  - he is advised to maintain close follow up with [name], MD for primary care needs, as well as his other providers for optimal and coordinated care.      I spent  43  minutes in the care of the patient today including review of labs from [place], Lipids, Thyroid Function, Hematology (current and previous including abstractions from other facilities); face-to-face time discussing  his blood glucose readings/logs, discussing hypoglycemia and hyperglycemia episodes and symptoms, medications doses, his options of short and long term treatment based on the latest standards of care / guidelines;  discussion about incorporating lifestyle medicine;  and documenting the encounter. Risk reduction  counseling performed per USPSTF guidelines to reduce obesity and cardiovascular risk factors.     Please refer to Patient Instructions for Blood Glucose Monitoring and Insulin/Medications Dosing Guide"  in media tab for additional information. Please  also refer to " Patient Self Inventory" in the Media  tab for reviewed elements of pertinent patient history.  Marvin Frost participated in the discussions, expressed understanding, and voiced agreement with the above plans.  All questions were answered to his satisfaction. he is encouraged to contact clinic should he have any questions or concerns prior to his return visit.    Follow up plan: - Return in about 3 months (around 12/14/2022) for Diabetes F/U with A1c in office, No previsit labs, Bring meter and logs.    Rayetta Pigg, [facility] [address] [place], [place] [address] Phone: [phone] Fax: [phone]  09/13/2022, 3:56 PM

## 2022-10-04 ENCOUNTER — Other Ambulatory Visit: Payer: Self-pay | Admitting: Family Medicine

## 2022-10-25 ENCOUNTER — Other Ambulatory Visit: Payer: Self-pay | Admitting: Family Medicine

## 2022-10-26 ENCOUNTER — Telehealth: Payer: Self-pay | Admitting: Nurse Practitioner

## 2022-10-26 NOTE — Telephone Encounter (Signed)
Patient was called and made aware of Marvin Frost's recommendation. 

## 2022-10-26 NOTE — Telephone Encounter (Signed)
Patient was called and a message was left asking that he call our office back. 

## 2022-11-09 ENCOUNTER — Telehealth: Payer: Self-pay | Admitting: *Deleted

## 2022-11-09 ENCOUNTER — Other Ambulatory Visit: Payer: Self-pay | Admitting: *Deleted

## 2022-11-09 DIAGNOSIS — E1159 Type 2 diabetes mellitus with other circulatory complications: Secondary | ICD-10-CM

## 2022-11-09 MED ORDER — TRULICITY 0.75 MG/0.5ML ~~LOC~~ SOAJ: 0.7500 mg | SUBCUTANEOUS | Status: DC

## 2022-11-09 NOTE — Telephone Encounter (Signed)
Patient was called and made aware. He is going to call [facility] to see if they may have the lower dose. He will call us back.

## 2022-11-09 NOTE — Telephone Encounter (Signed)
[facility] contacted him about Trulicity  They shared with him that this medication was on back order and wanted to share two alternatives. The first one is Ozempic. The second Bydureon. He said that both would need a PA . Just for [place] to let him know which one you are prescribing.

## 2022-11-09 NOTE — Telephone Encounter (Signed)
I tried him on Ozempic previously due to Trulicity being on back order and it was too expensive even with the PA.  Let's try Bydureon 2 mg SQ weekly this time. He needs to be on the look out for symptoms of pancreatitis (which he had before when on Victoza).  Switching products may increase his risk.    The other option would be to see if his [facility] had a lower dosage in stock for the time period until the shortage is over.  Which would he prefer?  Wait or try Bydureon?

## 2022-11-13 ENCOUNTER — Other Ambulatory Visit: Payer: Self-pay | Admitting: Family Medicine

## 2022-11-21 ENCOUNTER — Ambulatory Visit (INDEPENDENT_AMBULATORY_CARE_PROVIDER_SITE_OTHER): Payer: Medicare Other | Admitting: Family Medicine

## 2022-11-21 ENCOUNTER — Encounter: Payer: Self-pay | Admitting: Family Medicine

## 2022-11-21 VITALS — BP 117/75 | HR 76 | Temp 97.8°F | Ht 68.0 in | Wt 196.6 lb

## 2022-11-21 DIAGNOSIS — E1159 Type 2 diabetes mellitus with other circulatory complications: Secondary | ICD-10-CM | POA: Diagnosis not present

## 2022-11-21 DIAGNOSIS — I10 Essential (primary) hypertension: Secondary | ICD-10-CM | POA: Diagnosis not present

## 2022-11-21 DIAGNOSIS — E782 Mixed hyperlipidemia: Secondary | ICD-10-CM

## 2022-11-21 LAB — CMP14+EGFR
ALT: 17 IU/L (ref 0–44)
AST: 14 IU/L (ref 0–40)
Albumin/Globulin Ratio: 2 (ref 1.2–2.2)
Albumin: 3.9 g/dL (ref 3.8–4.8)
Alkaline Phosphatase: 66 IU/L (ref 44–121)
BUN/Creatinine Ratio: 10 (ref 10–24)
BUN: 11 mg/dL (ref 8–27)
Bilirubin Total: 0.4 mg/dL (ref 0.0–1.2)
CO2: 23 mmol/L (ref 20–29)
Calcium: 9.2 mg/dL (ref 8.6–10.2)
Chloride: 101 mmol/L (ref 96–106)
Creatinine, Ser: 1.1 mg/dL (ref 0.76–1.27)
Globulin, Total: 2 g/dL (ref 1.5–4.5)
Glucose: 150 mg/dL — ABNORMAL HIGH (ref 70–99)
Potassium: 4.3 mmol/L (ref 3.5–5.2)
Sodium: 139 mmol/L (ref 134–144)
Total Protein: 5.9 g/dL — ABNORMAL LOW (ref 6.0–8.5)
eGFR: 69 mL/min/{1.73_m2} (ref >59)

## 2022-11-21 LAB — CBC WITH DIFFERENTIAL/PLATELET
Basophils Absolute: 0.2 10*3/uL (ref 0.0–0.2)
Basos: 2 %
EOS (ABSOLUTE): 0.7 10*3/uL — ABNORMAL HIGH (ref 0.0–0.4)
Eos: 7 %
Hematocrit: 47.4 % (ref 37.5–51.0)
Hemoglobin: 15.6 g/dL (ref 13.0–17.7)
Immature Grans (Abs): 0 10*3/uL (ref 0.0–0.1)
Immature Granulocytes: 0 %
Lymphocytes Absolute: 1.3 10*3/uL (ref 0.7–3.1)
Lymphs: 13 %
MCH: 29.7 pg (ref 26.6–33.0)
MCHC: 32.9 g/dL (ref 31.5–35.7)
MCV: 90 fL (ref 79–97)
Monocytes Absolute: 0.8 10*3/uL (ref 0.1–0.9)
Monocytes: 8 %
Neutrophils Absolute: 6.4 10*3/uL (ref 1.4–7.0)
Neutrophils: 70 %
Platelets: 250 10*3/uL (ref 150–450)
RBC: 5.25 x10E6/uL (ref 4.14–5.80)
RDW: 13.3 % (ref 11.6–15.4)
WBC: 9.4 10*3/uL (ref 3.4–10.8)

## 2022-11-21 LAB — LIPID PANEL
Chol/HDL Ratio: 2.1 ratio (ref 0.0–5.0)
Cholesterol, Total: 90 mg/dL — ABNORMAL LOW (ref 100–199)
HDL: 43 mg/dL
LDL Chol Calc (NIH): 16 mg/dL (ref 0–99)
Triglycerides: 201 mg/dL — ABNORMAL HIGH (ref 0–149)
VLDL Cholesterol Cal: 31 mg/dL (ref 5–40)

## 2022-11-21 MED ORDER — OLMESARTAN MEDOXOMIL-HCTZ 40-25 MG PO TABS: 1.0000 | ORAL_TABLET | Freq: Every day | ORAL | 0 refills | Status: DC

## 2022-11-21 MED ORDER — METOPROLOL SUCCINATE ER 100 MG PO TB24: ORAL_TABLET | ORAL | 3 refills | Status: DC

## 2022-11-21 MED ORDER — ROSUVASTATIN CALCIUM 40 MG PO TABS: 40.0000 mg | ORAL_TABLET | ORAL | 3 refills | Status: DC

## 2022-11-21 MED ORDER — PANTOPRAZOLE SODIUM 40 MG PO TBEC: 40.0000 mg | DELAYED_RELEASE_TABLET | Freq: Every day | ORAL | 3 refills | Status: DC

## 2022-11-21 NOTE — Progress Notes (Signed)
Subjective:  Patient ID: Marvin Frost,  male    DOB: 26-Jul-1945  Age: 77 y.o.    CC: Medical Management of Chronic Issues   HPI Mister Huckle presents for  follow-up of hypertension. Patient has no history of headache chest pain or shortness of breath or recent cough. Patient also denies symptoms of TIA such as numbness weakness lateralizing. Patient denies side effects from medication. States taking it regularly.  Patient also  in for follow-up of elevated cholesterol. Doing well without complaints on current medication. Denies side effects  including myalgia and arthralgia and nausea. Also in today for liver function testing. Currently no chest pain, shortness of breath or other cardiovascular related symptoms noted.  Follow-up of diabetes. Patient denies symptoms such as excessive hunger or urinary frequency, excessive hunger, nausea No significant hypoglycemic spells noted. Medications reviewed. Pt reports taking them regularly. Pt. denies complication/adverse reaction today.    History Salil has a past medical history of Allergy, Anemia, Arthritis, CAD (coronary artery disease), Cancer (HCC), Cataract, Cervical radiculopathy (09/14/2020), Diabetes mellitus (1995), Dysrhythmia, Gastric polyps (09/18/2017), GERD (gastroesophageal reflux disease), History of frequent urinary tract infections, History of hiatal hernia, Hyperlipemia, Hypertension, Obesity, and Pancreatitis.   He has a past surgical history that includes right shoulder surgery; Hydrocele excision / repair; Colonoscopy (11/2003); Upper gastrointestinal endoscopy; Cardiac catheterization; left calf surgery ; left index finger arthrioscopic surgery ; lymph node removed from right neck ; Total hip arthroplasty (Right, 03/16/2015); Total hip arthroplasty (Left, 06/22/2015); Eye surgery (Bilateral); LEFT HEART CATH AND CORONARY ANGIOGRAPHY (N/A, 05/28/2018); CORONARY STENT INTERVENTION (N/A, 05/28/2018); CORONARY PRESSURE/FFR STUDY (N/A,  05/28/2018); and Joint replacement (2017).   His family history includes Diabetes in his brother; Early death in his father; GI problems in his sister; Healthy in his daughter; Heart attack in his mother; Heart attack (age of onset: 52) in his father; Heart attack (age of onset: 50) in his brother; Heart disease in his brother, father, and mother.He reports that he quit smoking about 38 years ago. His smoking use included cigarettes. He has a 10.00 pack-year smoking history. He quit smokeless tobacco use about 44 years ago.  His smokeless tobacco use included chew. He reports that he does not drink alcohol and does not use drugs.  Current Outpatient Medications on File Prior to Visit  Medication Sig Dispense Refill   aspirin EC 81 MG EC tablet Take 1 tablet (81 mg total) by mouth daily.     cetirizine (ZYRTEC) 10 MG tablet Take 10 mg by mouth as needed for allergies or rhinitis.      Continuous Blood Gluc Sensor (FREESTYLE LIBRE 14 DAY SENSOR) MISC Inject 1 each into the skin every 14 (fourteen) days. Use as directed. 6 each 2   Dulaglutide (TRULICITY) 0.75 MG/0.5ML SOPN Inject 0.75 mg into the skin once a week. 6 mL ML   Dulaglutide (TRULICITY) 4.5 MG/0.5ML SOPN INJECT 4.5 MG INTO THE SKIN ONCE A WEEK 6 mL 3   Evolocumab (REPATHA SURECLICK) 140 MG/ML SOAJ INJECT 140 MG UNDER THE SKIN EVERY 14 DAYS 6 mL 1   ferrous sulfate 325 (65 FE) MG tablet Take 1 tablet (325 mg total) by mouth every other day. (Patient taking differently: Take 325 mg by mouth every morning.) 90 tablet 1   fluocinolone (SYNALAR) 0.01 % external solution Apply topically 2 (two) times daily. To scalp 60 mL 5   fluticasone (FLONASE) 50 MCG/ACT nasal spray Place 2 sprays into both nostrils daily. 16 g 6   insulin  glargine (LANTUS) 100 UNIT/ML injection Inject 0.8 mLs (80 Units total) into the skin at bedtime. 75 mL 3   Insulin Pen Needle (NOVOTWIST) 32G X 5 MM MISC USE WITH INSULIN PENS UP TO 5 TIMES DAILY Dx E11.59 500 each 4    Insulin Pen Needle 32G X 5 MM MISC by Does not apply route. Use with insulin pen needles up to 5 times daily  E11.9     Lancets (FREESTYLE) lancets Check BS BID and PRN. DX.E11.9 300 each 3   metFORMIN (GLUCOPHAGE-XR) 500 MG 24 hr tablet Take 2 tablets (1,000 mg total) by mouth daily with breakfast. 180 tablet 3   nitroGLYCERIN (NITROSTAT) 0.4 MG SL tablet DISSOLVE 1 TABLET UNDER THE TONGUE EVERY 5 MINUTES AS NEEDED FOR CHEST PAIN 25 tablet 3   No current facility-administered medications on file prior to visit.    ROS Review of Systems  Constitutional:  Negative for fever.  Respiratory:  Negative for shortness of breath.   Cardiovascular:  Negative for chest pain.  Musculoskeletal:  Negative for arthralgias.  Skin:  Negative for rash.    Objective:  BP 117/75   Pulse 76   Temp 97.8 F (36.6 C)   Ht 5\' 8"  (1.727 m)   Wt 196 lb 9.6 oz (89.2 kg)   SpO2 95%   BMI 29.89 kg/m   BP Readings from Last 3 Encounters:  11/21/22 117/75  09/13/22 128/83  06/07/22 118/72    Wt Readings from Last 3 Encounters:  11/21/22 196 lb 9.6 oz (89.2 kg)  09/13/22 199 lb 9.6 oz (90.5 kg)  07/31/22 200 lb (90.7 kg)     Physical Exam Vitals reviewed.  Constitutional:      Appearance: He is well-developed.  HENT:     Head: Normocephalic and atraumatic.     Right Ear: External ear normal.     Left Ear: External ear normal.     Mouth/Throat:     Pharynx: No oropharyngeal exudate or posterior oropharyngeal erythema.  Eyes:     Pupils: Pupils are equal, round, and reactive to light.  Cardiovascular:     Rate and Rhythm: Normal rate and regular rhythm.     Heart sounds: No murmur heard. Pulmonary:     Effort: No respiratory distress.     Breath sounds: Normal breath sounds.  Musculoskeletal:     Cervical back: Normal range of motion and neck supple.  Neurological:     Mental Status: He is alert and oriented to person, place, and time.     Diabetic Foot Exam - Simple   No data  filed     Lab Results  Component Value Date   HGBA1C 9.2 (A) 09/13/2022   HGBA1C 8.6 (A) 05/15/2022   HGBA1C 7.7 (H) 11/16/2021    Assessment & Plan:   Rameek was seen today for medical management of chronic issues.  Diagnoses and all orders for this visit:  Essential hypertension, benign -     CBC with Differential/Platelet -     CMP14+EGFR -     metoprolol succinate (TOPROL-XL) 100 MG 24 hr tablet; TAKE 1 TABLET DAILY FOR BLOOD PRESSURE CONTROL  Mixed hyperlipidemia -     Lipid panel -     rosuvastatin (CRESTOR) 40 MG tablet; Take 1 tablet (40 mg total) by mouth every 3 (three) days.  DM type 2 causing vascular disease (HCC) -     Microalbumin / creatinine urine ratio  Other orders -     olmesartan-hydrochlorothiazide (BENICAR  HCT) 40-25 MG tablet; Take 1 tablet by mouth daily. -     pantoprazole (PROTONIX) 40 MG tablet; Take 1 tablet (40 mg total) by mouth daily.   I have discontinued Treyton Delahunty's terbinafine and amoxicillin. I have also changed his olmesartan-hydrochlorothiazide. Additionally, I am having him maintain his cetirizine, aspirin EC, ferrous sulfate, Insulin Pen Needle, fluocinolone, Trulicity, Insulin Pen Needle, freestyle, Repatha SureClick, FreeStyle Libre 14 Day Sensor, fluticasone, insulin glargine, metFORMIN, nitroGLYCERIN, Trulicity, metoprolol succinate, pantoprazole, and rosuvastatin.  Meds ordered this encounter  Medications   metoprolol succinate (TOPROL-XL) 100 MG 24 hr tablet    Sig: TAKE 1 TABLET DAILY FOR BLOOD PRESSURE CONTROL    Dispense:  90 tablet    Refill:  3   olmesartan-hydrochlorothiazide (BENICAR HCT) 40-25 MG tablet    Sig: Take 1 tablet by mouth daily.    Dispense:  90 tablet    Refill:  0   pantoprazole (PROTONIX) 40 MG tablet    Sig: Take 1 tablet (40 mg total) by mouth daily.    Dispense:  90 tablet    Refill:  3   rosuvastatin (CRESTOR) 40 MG tablet    Sig: Take 1 tablet (40 mg total) by mouth every 3 (three) days.     Dispense:  90 tablet    Refill:  3     Follow-up: Return in about 6 months (around 05/24/2023).  Mechele Claude, M.D.

## 2022-11-22 LAB — MICROALBUMIN / CREATININE URINE RATIO
Creatinine, Urine: 239.8 mg/dL
Microalb/Creat Ratio: 10 mg/g creat (ref 0–29)
Microalbumin, Urine: 24.8 ug/mL

## 2022-11-22 NOTE — Progress Notes (Signed)
Hello Seiya,  Your lab result is normal and/or stable.Some minor variations that are not significant are commonly marked abnormal, but do not represent any medical problem for you.  Best regards, Skyeler Scalese, M.D.

## 2022-11-27 ENCOUNTER — Encounter: Payer: Self-pay | Admitting: Internal Medicine

## 2022-12-05 ENCOUNTER — Ambulatory Visit (INDEPENDENT_AMBULATORY_CARE_PROVIDER_SITE_OTHER): Payer: Medicare Other | Admitting: Internal Medicine

## 2022-12-05 ENCOUNTER — Encounter: Payer: Self-pay | Admitting: Internal Medicine

## 2022-12-05 ENCOUNTER — Other Ambulatory Visit (INDEPENDENT_AMBULATORY_CARE_PROVIDER_SITE_OTHER): Payer: Medicare Other

## 2022-12-05 VITALS — BP 124/76 | HR 80 | Ht 68.0 in | Wt 194.8 lb

## 2022-12-05 DIAGNOSIS — K317 Polyp of stomach and duodenum: Secondary | ICD-10-CM | POA: Diagnosis not present

## 2022-12-05 DIAGNOSIS — R197 Diarrhea, unspecified: Secondary | ICD-10-CM

## 2022-12-05 DIAGNOSIS — R195 Other fecal abnormalities: Secondary | ICD-10-CM

## 2022-12-05 LAB — CBC WITH DIFFERENTIAL/PLATELET
Basophils Absolute: 0.3 10*3/uL — ABNORMAL HIGH (ref 0.0–0.1)
Basophils Relative: 3.3 % — ABNORMAL HIGH (ref 0.0–3.0)
Eosinophils Absolute: 0.7 10*3/uL (ref 0.0–0.7)
Eosinophils Relative: 7.5 % — ABNORMAL HIGH (ref 0.0–5.0)
HCT: 47.5 % (ref 39.0–52.0)
Hemoglobin: 15.6 g/dL (ref 13.0–17.0)
Lymphocytes Relative: 15.6 % (ref 12.0–46.0)
Lymphs Abs: 1.4 10*3/uL (ref 0.7–4.0)
MCHC: 32.8 g/dL (ref 30.0–36.0)
MCV: 90 fl (ref 78.0–100.0)
Monocytes Absolute: 0.7 10*3/uL (ref 0.1–1.0)
Monocytes Relative: 7.5 % (ref 3.0–12.0)
Neutro Abs: 6 10*3/uL (ref 1.4–7.7)
Neutrophils Relative %: 66.1 % (ref 43.0–77.0)
Platelets: 245 10*3/uL (ref 150.0–400.0)
RBC: 5.27 Mil/uL (ref 4.22–5.81)
RDW: 14.9 % (ref 11.5–15.5)
WBC: 9.1 10*3/uL (ref 4.0–10.5)

## 2022-12-05 NOTE — Progress Notes (Signed)
Marvin Frost 77 y.o. 06/01/1946 [id number]  Assessment & Plan:   Encounter Diagnoses  Name Primary?   Diarrhea, unspecified type Yes   Heme + stool    Gastric polyps     Evaluate w/ EGD and colonoscopy ? Olmesartaninduced sprue syndrome - take duodenal bxs ? Metformin effect  The risks and benefits as well as alternatives of endoscopic procedure(s) have been discussed and reviewed. All questions answered. The patient agrees to proceed.  Need to clarify re: artificial sweetener use  Subjective:   Chief Complaint:diarrhea, change in bowel habits  HPI 77 yo wm w/ hx Fe defic anemia, gastric polyps, GERD, Htn, DM w/ several month hx diarrhea issues. He went on Ozempic fall 2023 and it helped weight and DM but caused diarrhea so stopped by early 2024. He has continued to experience intermittent diarrhea and the other week had more severe watery diarrhea with dark and possibly black stools. No pain. + subjective fever recently. No sick contacts. Did have metformin increased months ago. The severe sxs have abated. No clear bleeding (red blood).  GI ROS o/w negative.   Wt Readings from Last 3 Encounters:  12/05/22 194 lb 12.8 oz (88.4 kg)  11/21/22 196 lb 9.6 oz (89.2 kg)  09/13/22 199 lb 9.6 oz (90.5 kg)   EGD 09/30/19 gastric polyps HPP and fundic gland 09/13/2017 colonoscopy severe diverticulosis Allergies  Allergen Reactions   Brilinta [Ticagrelor]     Rash and SOB    Liraglutide Other (See Comments)    Abdominal pain and enlarged pancreas on CT ? pancreatitis   Baxinets [Cetylpyridinium-Benzocaine] Other (See Comments)    Unknown reaction   Celebrex [Celecoxib] Nausea Only   Toprol Xl [Metoprolol Succinate] Other (See Comments)    Pt. Has not had any issues with this for many years    Triplex Ad Other (See Comments)    Abdominal pain and enlarged pancreas on CT? pancreatitis   Welchol [Colesevelam Hcl] Other (See Comments)    constipation   Norvasc [Amlodipine  Besylate] Rash   Statins Other (See Comments)    muscle and joint pain if taken for long periods of time, takes Crestor every 2-3 days to avoid this reaction   Current Meds  Medication Sig   aspirin EC 81 MG EC tablet Take 1 tablet (81 mg total) by mouth daily.   cetirizine (ZYRTEC) 10 MG tablet Take 10 mg by mouth as needed for allergies or rhinitis.    Continuous Blood Gluc Sensor (FREESTYLE LIBRE 14 DAY SENSOR) MISC Inject 1 each into the skin every 14 (fourteen) days. Use as directed.   Dulaglutide (TRULICITY) 0.75 MG/0.5ML SOPN Inject 0.75 mg into the skin once a week.   Dulaglutide (TRULICITY) 4.5 MG/0.5ML SOPN INJECT 4.5 MG INTO THE SKIN ONCE A WEEK   Evolocumab (REPATHA SURECLICK) 140 MG/ML SOAJ INJECT 140 MG UNDER THE SKIN EVERY 14 DAYS   ferrous sulfate 325 (65 FE) MG tablet Take 1 tablet (325 mg total) by mouth every other day. (Patient taking differently: Take 325 mg by mouth every morning.)   fluocinolone (SYNALAR) 0.01 % external solution Apply topically 2 (two) times daily. To scalp   fluticasone (FLONASE) 50 MCG/ACT nasal spray Place 2 sprays into both nostrils daily.   insulin glargine (LANTUS) 100 UNIT/ML injection Inject 0.8 mLs (80 Units total) into the skin at bedtime.   Insulin Pen Needle (NOVOTWIST) 32G X 5 MM MISC USE WITH INSULIN PENS UP TO 5 TIMES DAILY Dx E11.59   Insulin  Pen Needle 32G X 5 MM MISC by Does not apply route. Use with insulin pen needles up to 5 times daily  E11.9   Lancets (FREESTYLE) lancets Check BS BID and PRN. DX.E11.9   metFORMIN (GLUCOPHAGE-XR) 500 MG 24 hr tablet Take 2 tablets (1,000 mg total) by mouth daily with breakfast.   metoprolol succinate (TOPROL-XL) 100 MG 24 hr tablet TAKE 1 TABLET DAILY FOR BLOOD PRESSURE CONTROL   nitroGLYCERIN (NITROSTAT) 0.4 MG SL tablet DISSOLVE 1 TABLET UNDER THE TONGUE EVERY 5 MINUTES AS NEEDED FOR CHEST PAIN   olmesartan-hydrochlorothiazide (BENICAR HCT) 40-25 MG tablet Take 1 tablet by mouth daily.    pantoprazole (PROTONIX) 40 MG tablet Take 1 tablet (40 mg total) by mouth daily.   rosuvastatin (CRESTOR) 40 MG tablet Take 1 tablet (40 mg total) by mouth every 3 (three) days.   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    Cataract    both removed   Cervical radiculopathy 09/14/2020   Diabetes mellitus 1995   Dysrhythmia    history of heart palpitations    Gastric polyps 09/18/2017   GERD (gastroesophageal reflux disease)    History of frequent urinary tract infections    History of hiatal hernia    Hyperlipemia    Hypertension    Obesity    Pancreatitis    Past Surgical History:  Procedure Laterality Date   CARDIAC CATHETERIZATION     2004 and 1999- done by Dr Antoine Poche    COLONOSCOPY  11/2003   diverticulosis, no polyps   CORONARY PRESSURE/FFR STUDY N/A 05/28/2018   Procedure: INTRAVASCULAR PRESSURE WIRE/FFR STUDY;  Surgeon: Tonny Bollman, MD;  Location: [facility] INVASIVE CV LAB;  Service: Cardiovascular;  Laterality: N/A;   CORONARY STENT INTERVENTION N/A 05/28/2018   Procedure: CORONARY STENT INTERVENTION;  Surgeon: Tonny Bollman, MD;  Location: [facility] INVASIVE CV LAB;  Service: Cardiovascular;  Laterality: N/A;   EYE SURGERY Bilateral    cateracts - dr Dione Booze   HYDROCELE EXCISION / REPAIR     JOINT REPLACEMENT  2017   both hips   left calf surgery      related to trauma of being torn    LEFT HEART CATH AND CORONARY ANGIOGRAPHY N/A 05/28/2018   Procedure: LEFT HEART CATH AND CORONARY ANGIOGRAPHY;  Surgeon: Tonny Bollman, MD;  Location: [facility] INVASIVE CV LAB;  Service: Cardiovascular;  Laterality: N/A;   left index finger arthrioscopic surgery      lymph node removed from right neck      right shoulder surgery     TOTAL HIP ARTHROPLASTY Right 03/16/2015   Procedure: RIGHT TOTAL HIP ARTHROPLASTY ANTERIOR APPROACH;  Surgeon:  Durene Romans, MD;  Location: [facility]S;  Service: Orthopedics;  Laterality: Right;   TOTAL HIP ARTHROPLASTY Left 06/22/2015   Procedure: LEFT TOTAL HIP ARTHROPLASTY ANTERIOR APPROACH;  Surgeon: Durene Romans, MD;  Location: [facility];  Service: Orthopedics;  Laterality: Left;   UPPER GASTROINTESTINAL ENDOSCOPY     hiatal hernia 2cm, mild gastritis - 2012, multiple hyperplastic and fundic gland gastric polyps 2019   Social History   Social History Narrative   Married   Retired from [employer]   1 child   Former but no current tobacco   No EtOH/drugs   family history includes  Diabetes in his brother; Early death in his father; GI problems in his sister; Healthy in his daughter; Heart attack in his mother; Heart attack (age of onset: 46) in his father; Heart attack (age of onset: 64) in his brother; Heart disease in his brother, father, and mother.   Review of Systems As per HPI Back pain, cough, myalgia, arthritis O/w negative  Objective:   Physical Exam @BP  124/76   Pulse 80   Ht 5\' 8"  (1.727 m)   Wt 194 lb 12.8 oz (88.4 kg)   BMI 29.62 kg/m @  General:  Well-developed, well-nourished and in no acute distress Eyes:  anicteric. Lungs: Clear to auscultation bilaterally. Heart:   S1S2, no rubs, murmurs, gallops. Abdomen:  soft, non-tender, no hepatosplenomegaly, hernia, or mass and BS+.  Rectal: Deferred until colonoscopy Lymph:  no cervical or supraclavicular adenopathy. Extremities:   no edema, cyanosis or clubbing Neuro:  A&O x 3.  Psych:  appropriate mood and  Affect.   Data Reviewed: See HPI

## 2022-12-05 NOTE — Patient Instructions (Signed)
You have been scheduled for an endoscopy and colonoscopy. Please follow the written instructions given to you at your visit today. Please pick up your prep supplies at the pharmacy within the next 1-3 days. If you use inhalers (even only as needed), please bring them with you on the day of your procedure.  Your provider has requested that you go to the basement level for lab work before leaving today. Press "B" on the elevator. The lab is located at the first door on the left as you exit the elevator.  Due to recent changes in healthcare laws, you may see the results of your imaging and laboratory studies on MyChart before your provider has had a chance to review them.  We understand that in some cases there may be results that are confusing or concerning to you. Not all laboratory results come back in the same time frame and the provider may be waiting for multiple results in order to interpret others.  Please give us 48 hours in order for your provider to thoroughly review all the results before contacting the office for clarification of your results.   I appreciate the opportunity to care for you. Carl Gessner, MD, FACG 

## 2022-12-20 ENCOUNTER — Encounter: Payer: Self-pay | Admitting: Nurse Practitioner

## 2022-12-20 ENCOUNTER — Ambulatory Visit (INDEPENDENT_AMBULATORY_CARE_PROVIDER_SITE_OTHER): Payer: Medicare Other | Admitting: Nurse Practitioner

## 2022-12-20 VITALS — BP 125/85 | HR 65 | Ht 68.0 in | Wt 196.8 lb

## 2022-12-20 DIAGNOSIS — Z794 Long term (current) use of insulin: Secondary | ICD-10-CM

## 2022-12-20 DIAGNOSIS — E782 Mixed hyperlipidemia: Secondary | ICD-10-CM

## 2022-12-20 DIAGNOSIS — I10 Essential (primary) hypertension: Secondary | ICD-10-CM

## 2022-12-20 DIAGNOSIS — Z7985 Long-term (current) use of injectable non-insulin antidiabetic drugs: Secondary | ICD-10-CM

## 2022-12-20 DIAGNOSIS — Z7984 Long term (current) use of oral hypoglycemic drugs: Secondary | ICD-10-CM

## 2022-12-20 DIAGNOSIS — E1159 Type 2 diabetes mellitus with other circulatory complications: Secondary | ICD-10-CM | POA: Diagnosis not present

## 2022-12-20 LAB — POCT GLYCOSYLATED HEMOGLOBIN (HGB A1C): Hemoglobin A1C: 8.8 % — AB (ref 4.0–5.6)

## 2022-12-20 MED ORDER — TRULICITY 1.5 MG/0.5ML ~~LOC~~ SOAJ: 1.5000 mg | SUBCUTANEOUS | 0 refills | Status: DC

## 2022-12-20 MED ORDER — INSULIN GLARGINE 100 UNIT/ML ~~LOC~~ SOLN
75.0000 [IU] | Freq: Every day | SUBCUTANEOUS | 3 refills | Status: DC
Start: 2022-12-20 — End: 2023-07-03

## 2022-12-20 NOTE — Progress Notes (Signed)
12/20/2022, 2:12 PM     Endocrinology follow-up note   Subjective:    Patient ID: Marvin Frost, male    DOB: 1945/07/20.   Marvin Frost is being seen in follow-up after he was seen in consultation for management of currently uncontrolled symptomatic diabetes. -He also has dyslipidemia, hypertension. PMD:   Mechele Claude, MD.   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    Cataract    both removed   Cervical radiculopathy 09/14/2020   Diabetes mellitus 1995   Dysrhythmia    history of heart palpitations    Gastric polyps 09/18/2017   GERD (gastroesophageal reflux disease)    History of frequent urinary tract infections    History of hiatal hernia    Hyperlipemia    Hypertension    Obesity    Pancreatitis     Past Surgical History:  Procedure Laterality Date   CARDIAC CATHETERIZATION     2004 and 1999- done by Dr Antoine Poche    COLONOSCOPY  11/2003   diverticulosis, no polyps   CORONARY PRESSURE/FFR STUDY N/A 05/28/2018   Procedure: INTRAVASCULAR PRESSURE WIRE/FFR STUDY;  Surgeon: Tonny Bollman, MD;  Location: [facility] INVASIVE CV LAB;  Service: Cardiovascular;  Laterality: N/A;   CORONARY STENT INTERVENTION N/A 05/28/2018   Procedure: CORONARY STENT INTERVENTION;  Surgeon: Tonny Bollman, MD;  Location: [facility] INVASIVE CV LAB;  Service: Cardiovascular;  Laterality: N/A;   EYE SURGERY Bilateral    cateracts - dr Dione Booze   HYDROCELE EXCISION / REPAIR     JOINT REPLACEMENT  2017   both hips   left calf surgery      related to trauma of being torn    LEFT HEART CATH AND CORONARY ANGIOGRAPHY N/A 05/28/2018   Procedure: LEFT HEART CATH AND CORONARY ANGIOGRAPHY;  Surgeon: Tonny Bollman, MD;  Location: [facility] INVASIVE CV LAB;  Service: Cardiovascular;   Laterality: N/A;   left index finger arthrioscopic surgery      lymph node removed from right neck      right shoulder surgery     TOTAL HIP ARTHROPLASTY Right 03/16/2015   Procedure: RIGHT TOTAL HIP ARTHROPLASTY ANTERIOR APPROACH;  Surgeon: Durene Romans, MD;  Location: [facility];  Service: Orthopedics;  Laterality: Right;   TOTAL HIP ARTHROPLASTY Left 06/22/2015   Procedure: LEFT TOTAL HIP ARTHROPLASTY ANTERIOR APPROACH;  Surgeon: Durene Romans, MD;  Location: [facility];  Service: Orthopedics;  Laterality: Left;   UPPER GASTROINTESTINAL ENDOSCOPY     hiatal hernia 2cm, mild gastritis - 2012, multiple hyperplastic and fundic gland gastric polyps 2019    Social History   Socioeconomic History   Marital status: Married    Spouse name: Dondra Spry   Number of children: 1   Years of education: Some College   Highest education level: Associate degree: occupational, Scientist, product/process development, or vocational program  Occupational History   Occupation: Event organiser: [employer]    Comment: retired  Tobacco Use   Smoking status: Former    Packs/day: 1.00    Years: 10.00    Additional pack years: 0.00    Total pack years: 10.00    Types: Cigarettes    Quit date: 04/06/1984    Years since quitting: 38.7   Smokeless tobacco: Former    Types: Chew    Quit date: 06/26/1978   Tobacco comments:    I do not smoke  Vaping Use   Vaping Use: Never used  Substance and Sexual Activity   Alcohol use: No   Drug use: No   Sexual activity: Yes    Birth control/protection: None  Other Topics Concern   Not on file  Social History Narrative   Married   Retired from [employer]   1 child   Former but no current tobacco   No EtOH/drugs   Social Determinants of Corporate investment banker Strain: Low Risk  (11/18/2022)   Overall Financial Resource Strain (CARDIA)    Difficulty of Paying Living Expenses: Not very hard  Food Insecurity: No Food Insecurity (11/18/2022)   Hunger Vital Sign    Worried About  Running Out of Food in the Last Year: Never true    Ran Out of Food in the Last Year: Never true  Transportation Needs: No Transportation Needs (11/18/2022)   PRAPARE - Administrator, Civil Service (Medical): No    Lack of Transportation (Non-Medical): No  Physical Activity: Insufficiently Active (11/18/2022)   Exercise Vital Sign    Days of Exercise per Week: 3 days    Minutes of Exercise per Session: 30 min  Stress: No Stress Concern Present (11/18/2022)   [employer] of Occupational Health - Occupational Stress Questionnaire    Feeling of Stress : Not at all  Social Connections: Moderately Integrated (11/18/2022)   Social Connection and Isolation Panel [NHANES]    Frequency of Communication with Friends and Family: Once a week    Frequency of Social Gatherings with Friends and Family: Once a week    Attends Religious Services: More than 4 times per year    Active Member of [employer] or Organizations: Yes    Attends Engineer, structural: More than 4 times per year    Marital Status: Married    Family History  Problem Relation Age of Onset   Heart disease Mother    Heart attack Mother    Diabetes Brother    Heart disease Brother    Heart attack Brother 107   Heart attack Father 41   Early death Father    Heart disease Father    GI problems Sister        torn esophagus during endoscopy   Healthy Daughter    Colon polyps Neg Hx    Esophageal cancer Neg Hx    Rectal cancer Neg Hx    Stomach cancer Neg Hx    Colon cancer Neg Hx     Outpatient Encounter Medications as of 12/20/2022  Medication Sig   aspirin EC 81 MG EC tablet Take 1 tablet (81 mg total) by mouth daily.   cetirizine (ZYRTEC) 10 MG tablet Take 10 mg by mouth as needed for allergies or rhinitis.    Continuous Blood Gluc Sensor (FREESTYLE LIBRE 14 DAY SENSOR) MISC Inject 1 each into the skin every 14 (fourteen) days. Use as directed.   Dulaglutide (TRULICITY) 0.75 MG/0.5ML SOPN Inject 0.75 mg  into the skin  once a week.   Dulaglutide (TRULICITY) 1.5 MG/0.5ML SOPN Inject 1.5 mg into the skin once a week.   Evolocumab (REPATHA SURECLICK) 140 MG/ML SOAJ INJECT 140 MG UNDER THE SKIN EVERY 14 DAYS   ferrous sulfate 325 (65 FE) MG tablet Take 1 tablet (325 mg total) by mouth every other day. (Patient taking differently: Take 325 mg by mouth every morning.)   fluocinolone (SYNALAR) 0.01 % external solution Apply topically 2 (two) times daily. To scalp   fluticasone (FLONASE) 50 MCG/ACT nasal spray Place 2 sprays into both nostrils daily.   Insulin Pen Needle (NOVOTWIST) 32G X 5 MM MISC USE WITH INSULIN PENS UP TO 5 TIMES DAILY Dx E11.59   Insulin Pen Needle 32G X 5 MM MISC by Does not apply route. Use with insulin pen needles up to 5 times daily  E11.9   Lancets (FREESTYLE) lancets Check BS BID and PRN. DX.E11.9   metFORMIN (GLUCOPHAGE-XR) 500 MG 24 hr tablet Take 2 tablets (1,000 mg total) by mouth daily with breakfast.   metoprolol succinate (TOPROL-XL) 100 MG 24 hr tablet TAKE 1 TABLET DAILY FOR BLOOD PRESSURE CONTROL   nitroGLYCERIN (NITROSTAT) 0.4 MG SL tablet DISSOLVE 1 TABLET UNDER THE TONGUE EVERY 5 MINUTES AS NEEDED FOR CHEST PAIN   olmesartan-hydrochlorothiazide (BENICAR HCT) 40-25 MG tablet Take 1 tablet by mouth daily.   pantoprazole (PROTONIX) 40 MG tablet Take 1 tablet (40 mg total) by mouth daily.   rosuvastatin (CRESTOR) 40 MG tablet Take 1 tablet (40 mg total) by mouth every 3 (three) days.   [DISCONTINUED] insulin glargine (LANTUS) 100 UNIT/ML injection Inject 0.8 mLs (80 Units total) into the skin at bedtime.   Dulaglutide (TRULICITY) 4.5 MG/0.5ML SOPN INJECT 4.5 MG INTO THE SKIN ONCE A WEEK (Patient not taking: Reported on 12/20/2022)   insulin glargine (LANTUS) 100 UNIT/ML injection Inject 0.75 mLs (75 Units total) into the skin at bedtime.   No facility-administered encounter medications on file as of 12/20/2022.    ALLERGIES: Allergies  Allergen Reactions   Brilinta  [Ticagrelor]     Rash and SOB    Liraglutide Other (See Comments)    Abdominal pain and enlarged pancreas on CT ? pancreatitis   Baxinets [Cetylpyridinium-Benzocaine] Other (See Comments)    Unknown reaction   Celebrex [Celecoxib] Nausea Only   Toprol Xl [Metoprolol Succinate] Other (See Comments)    Pt. Has not had any issues with this for many years    Triplex Ad Other (See Comments)    Abdominal pain and enlarged pancreas on CT? pancreatitis   Welchol [Colesevelam Hcl] Other (See Comments)    constipation   Norvasc [Amlodipine Besylate] Rash   Statins Other (See Comments)    muscle and joint pain if taken for long periods of time, takes Crestor every 2-3 days to avoid this reaction    VACCINATION STATUS: Immunization History  Administered Date(s) Administered   Fluad Quad(high Dose 65+) 03/26/2019, 04/19/2020, 05/12/2021, 03/30/2022   Influenza Whole 02/24/2010   Influenza, High Dose Seasonal PF 04/15/2015, 04/12/2016, 04/05/2017, 04/16/2018   Influenza,inj,Quad PF,6+ Mos 03/26/2013, 03/30/2014   Moderna Sars-Covid-2 Vaccination 07/31/2019, 08/28/2019, 03/15/2020   Pneumococcal Conjugate-13 06/17/2013   Pneumococcal Polysaccharide-23 02/24/2009   Td 02/24/2006   Tdap 04/27/2011   Zoster Recombinat (Shingrix) 07/22/2018   Zoster, Live 07/27/2006    Diabetes He presents for his follow-up diabetic visit. He has type 2 diabetes mellitus. Onset time: He was diagnosed at approximate age of 48 years. His disease course has been improving. There  are no hypoglycemic associated symptoms. Pertinent negatives for hypoglycemia include no confusion, headaches, pallor or seizures. Pertinent negatives for diabetes include no chest pain, no fatigue, no polydipsia, no polyphagia, no polyuria, no weakness and no weight loss. There are no hypoglycemic complications. Symptoms are stable. Diabetic complications include heart disease and nephropathy. Risk factors for coronary artery disease include  diabetes mellitus, dyslipidemia, family history, male sex, hypertension, sedentary lifestyle, tobacco exposure and obesity. Current diabetic treatment includes oral agent (monotherapy) and insulin injections (Trulicity). He is compliant with treatment most of the time. His weight is fluctuating minimally. He is following a generally healthy diet. When asked about meal planning, he reported none. He has not had a previous visit with a dietitian. He participates in exercise intermittently. His home blood glucose trend is decreasing steadily. His overall blood glucose range is 180-200 mg/dl. (He presents today with his CGM and logs showing improving glycemic profile.  His POCT A1c today is 8.8%, improving from last visit of 9.2%.   Analysis of his CGM shows TIR 41%, TAR 59%, TBR 0% with a GMI of 7.9%.  He denies any significant hypoglycemia (has woken up in the 60s on several occasions).) An ACE inhibitor/angiotensin II receptor blocker is being taken. He does not see a podiatrist.Eye exam is current.  Hyperlipidemia This is a chronic problem. The current episode started more than 1 year ago. The problem is controlled. Recent lipid tests were reviewed and are normal. Exacerbating diseases include chronic renal disease, diabetes and obesity. Factors aggravating his hyperlipidemia include beta blockers. Pertinent negatives include no chest pain, myalgias or shortness of breath. Current antihyperlipidemic treatment includes statins (Repatha every 2 weeks. Only takes his statin once every few days dt myalgias). The current treatment provides moderate improvement of lipids. Compliance problems include medication side effects.  Risk factors for coronary artery disease include male sex, obesity, family history, dyslipidemia, diabetes mellitus, hypertension and a sedentary lifestyle.  Hypertension This is a chronic problem. The current episode started more than 1 year ago. The problem has been gradually improving since  onset. The problem is controlled. Pertinent negatives include no chest pain, headaches, neck pain, palpitations or shortness of breath. There are no associated agents to hypertension. Risk factors for coronary artery disease include dyslipidemia, diabetes mellitus, family history, male gender, obesity, sedentary lifestyle and smoking/tobacco exposure. Past treatments include angiotensin blockers, diuretics and beta blockers. The current treatment provides moderate improvement. There are no compliance problems.  Hypertensive end-organ damage includes CAD/MI. Identifiable causes of hypertension include chronic renal disease.    Review of systems  Constitutional: + Minimally fluctuating body weight,  current Body mass index is 29.92 kg/m. , no fatigue, no subjective hyperthermia, no subjective hypothermia Eyes: no blurry vision, no xerophthalmia ENT: no sore throat, no nodules palpated in throat, no dysphagia/odynophagia, no hoarseness Cardiovascular: no chest pain, no shortness of breath, no palpitations, no leg swelling Respiratory: no cough, no shortness of breath Gastrointestinal: no nausea/vomiting/diarrhea- having Endo/Colonoscopy soon Musculoskeletal: no muscle/joint aches Skin: no rashes, no hyperemia Neurological: no tremors, no numbness, no tingling, no dizziness Psychiatric: no depression, no anxiety   Objective:    BP 125/85 (BP Location: Right Arm, Patient Position: Sitting, Cuff Size: Large)   Pulse 65   Ht 5\' 8"  (1.727 m)   Wt 196 lb 12.8 oz (89.3 kg)   BMI 29.92 kg/m   Wt Readings from Last 3 Encounters:  12/20/22 196 lb 12.8 oz (89.3 kg)  12/05/22 194 lb 12.8 oz (88.4 kg)  11/21/22 196 lb 9.6 oz (89.2 kg)    BP Readings from Last 3 Encounters:  12/20/22 125/85  12/05/22 124/76  11/21/22 117/75      Physical Exam- Limited  Constitutional:  Body mass index is 29.92 kg/m. , not in acute distress, normal state of mind Eyes:  EOMI, no exophthalmos Musculoskeletal:  no gross deformities, strength intact in all four extremities, no gross restriction of joint movements Skin:  no rashes, no hyperemia Neurological: no tremor with outstretched hands   Diabetic Foot Exam - Simple   No data filed    CMP     Component Value Date/Time   NA 139 11/21/2022 0909   K 4.3 11/21/2022 0909   CL 101 11/21/2022 0909   CO2 23 11/21/2022 0909   GLUCOSE 150 (H) 11/21/2022 0909   GLUCOSE 154 (H) 05/29/2018 0217   BUN 11 11/21/2022 0909   CREATININE 1.10 11/21/2022 0909   CREATININE 0.95 12/31/2012 0857   CALCIUM 9.2 11/21/2022 0909   PROT 5.9 (L) 11/21/2022 0909   ALBUMIN 3.9 11/21/2022 0909   AST 14 11/21/2022 0909   ALT 17 11/21/2022 0909   ALKPHOS 66 11/21/2022 0909   BILITOT 0.4 11/21/2022 0909   GFRNONAA 68 07/07/2020 1028   GFRNONAA 81 12/31/2012 0857   GFRAA 78 07/07/2020 1028   GFRAA >89 12/31/2012 0857    Diabetic Labs (most recent): Lab Results  Component Value Date   HGBA1C 8.8 (A) 12/20/2022   HGBA1C 9.2 (A) 09/13/2022   HGBA1C 8.6 (A) 05/15/2022   MICROALBUR 30 10/04/2021   MICROALBUR 10 12/30/2020   MICROALBUR 20 01/26/2015     Lipid Panel ( most recent) Lipid Panel     Component Value Date/Time   CHOL 90 (L) 11/21/2022 0909   CHOL 157 12/31/2012 0857   TRIG 201 (H) 11/21/2022 0909   TRIG 271 (H) 02/17/2016 0824   TRIG 184 (H) 12/31/2012 0857   HDL 43 11/21/2022 0909   HDL 44 02/17/2016 0824   HDL 45 12/31/2012 0857   CHOLHDL 2.1 11/21/2022 0909   LDLCALC 16 11/21/2022 0909   LDLCALC 52 01/19/2014 1007   LDLCALC 75 12/31/2012 0857   LDLDIRECT 39 11/02/2016 0806      Lab Results  Component Value Date   TSH 3.350 07/07/2020   TSH 2.720 12/30/2019   TSH 2.290 09/03/2014   FREET4 1.42 07/07/2020     Assessment & Plan:   1) DM type 2 causing vascular disease (HCC)  -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  - Marvin Frost has currently uncontrolled symptomatic type 2 DM since   77 years of age.  He presents today with his CGM and logs showing improving glycemic profile.  His POCT A1c today is 8.8%, improving from last visit of 9.2%.   Analysis of his CGM shows TIR 41%, TAR 59%, TBR 0% with a GMI of 7.9%.  He denies any significant hypoglycemia (has woken up in the 60s on several occasions).  He has been more active during these warmer months.  -I have reviewed his recent labs with him.  - I had a long discussion with him about the progressive nature of diabetes and the pathology behind its complications. -his diabetes is complicated by coronary artery disease, obesity and he remains at a high risk for more acute and chronic complications which include CAD, CVA, CKD, retinopathy, and neuropathy. These are all discussed in detail with him.  The following Lifestyle Medicine recommendations according  to [employer] of Lifestyle Medicine [facility]) were discussed and offered to patient and he agrees to start the journey:  A. Whole Foods, Plant-based plate comprising of fruits and vegetables, plant-based proteins, whole-grain carbohydrates was discussed in detail with the patient.   A list for source of those nutrients were also provided to the patient.  Patient will use only water or unsweetened tea for hydration. B.  The need to stay away from risky substances including alcohol, smoking; obtaining 7 to 9 hours of restorative sleep, at least 150 minutes of moderate intensity exercise weekly, the importance of healthy social connections,  and stress reduction techniques were discussed. C.  A full color page of  Calorie density of various food groups per pound showing examples of each food groups was provided to the patient.  - Nutritional counseling repeated at each appointment due to patients tendency to fall back in to old habits.  - The patient admits there is a room for improvement in their diet and drink choices. -  Suggestion is made for the patient to avoid simple  carbohydrates from their diet including Cakes, Sweet Desserts / Pastries, Ice Cream, Soda (diet and regular), Sweet Tea, Candies, Chips, Cookies, Sweet Pastries, Store Bought Juices, Alcohol in Excess of 1-2 drinks a day, Artificial Sweeteners, Coffee Creamer, and "Sugar-free" Products. This will help patient to have stable blood glucose profile and potentially avoid unintended weight gain.   - I encouraged the patient to switch to unprocessed or minimally processed complex starch and increased protein intake (animal or plant source), fruits, and vegetables.   - Patient is advised to stick to a routine mealtimes to eat 3 meals a day and avoid unnecessary snacks (to snack only to correct hypoglycemia).  - I have approached him with the following individualized plan to manage diabetes and patient agrees:   -He is advised to lower Lantus to 75 units SQ nightly, continue Metformin 1000 mg ER daily with breakfast, and increase Trulicity to 1.5 mg SQ weekly (was reduced to 0.75 previously due to medication shortage-worse in higher doses).  I encouraged him to add more fibrous fruits and veggies to his diet and eat more of a well balanced supper as he has been eating a lighter meal.  -He is encouraged to continue monitoring blood glucose using his CGM at least 4 times per day, before meals and at bedtime and call the clinic if he gets readings less than 70 or greater than 200 for 3 tests in a row.  - Patient specific target  A1c;  LDL, HDL, Triglycerides,  were discussed in detail.  2) Blood Pressure /Hypertension:   His blood pressure is controlled to target.  He is advised to continue Metoprolol 100 mg po daily and Benicar HCT 40-25 mg po daily.  He notes recent drops in BP after being outside working, is wondering if his BP meds need to be adjusted.  He has follow up in Sept with cardiology, says he may reach out sooner given his symptoms (getting dizzy with positional changes outdoors).  3)  Lipids/Hyperlipidemia: His most recent lipid panel from 11/21/22 shows controlled LDL of 16 and elevated triglycerides of 201(stable) . He is statin intolerant with myalgias.  He has tolerated taking his Crestor every 2-3 days.  He is advised to continue taking his Crestor 40 mg po every 3 days or so and continue his Repatha 140 mg SQ every 2 weeks.  Side effects and precautions discussed with him.    4)  Weight/Diet:  His Body mass index is 29.92 kg/m.--   He has lost some weight over the last few visits and will benefit from continued weight loss.  I discussed with him the fact that loss of 5 - 10% of his  current body weight will have the most impact on his diabetes management.  CDE Consult will be initiated . Exercise, and detailed carbohydrates information provided  -  detailed on discharge instructions.  5) Chronic Care/Health Maintenance: -he is on ACEI/ARB and Statin medications and is encouraged to initiate and continue to follow up with Ophthalmology, Dentist, Podiatrist at least yearly or according to recommendations, and advised to  stay away from smoking. I have recommended yearly flu vaccine and pneumonia vaccine at least every 5 years; moderate intensity exercise for up to 150 minutes weekly; and  sleep for at least 7 hours a day.  - he is advised to maintain close follow up with Mechele Claude, MD for primary care needs, as well as his other providers for optimal and coordinated care.      I spent  31  minutes in the care of the patient today including review of labs from CMP, Lipids, Thyroid Function, Hematology (current and previous including abstractions from other facilities); face-to-face time discussing  his blood glucose readings/logs, discussing hypoglycemia and hyperglycemia episodes and symptoms, medications doses, his options of short and long term treatment based on the latest standards of care / guidelines;  discussion about incorporating lifestyle medicine;  and  documenting the encounter. Risk reduction counseling performed per USPSTF guidelines to reduce obesity and cardiovascular risk factors.     Please refer to Patient Instructions for Blood Glucose Monitoring and Insulin/Medications Dosing Guide"  in media tab for additional information. Please  also refer to " Patient Self Inventory" in the Media  tab for reviewed elements of pertinent patient history.  Henry Russel participated in the discussions, expressed understanding, and voiced agreement with the above plans.  All questions were answered to his satisfaction. he is encouraged to contact clinic should he have any questions or concerns prior to his return visit.    Follow up plan: - Return in about 3 months (around 03/22/2023) for Diabetes F/U with A1c in office, No previsit labs, Bring meter and logs.   Ronny Bacon, [facility] [address] [place], [place] [address] Phone: [phone] Fax: [phone]  12/20/2022, 2:12 PM

## 2022-12-26 ENCOUNTER — Encounter: Payer: Self-pay | Admitting: Internal Medicine

## 2022-12-26 ENCOUNTER — Ambulatory Visit (AMBULATORY_SURGERY_CENTER): Payer: Medicare Other | Admitting: Internal Medicine

## 2022-12-26 VITALS — BP 100/61 | HR 60 | Temp 97.8°F | Resp 19 | Ht 68.0 in | Wt 194.0 lb

## 2022-12-26 DIAGNOSIS — R195 Other fecal abnormalities: Secondary | ICD-10-CM | POA: Diagnosis not present

## 2022-12-26 DIAGNOSIS — D649 Anemia, unspecified: Secondary | ICD-10-CM | POA: Diagnosis not present

## 2022-12-26 DIAGNOSIS — R197 Diarrhea, unspecified: Secondary | ICD-10-CM

## 2022-12-26 DIAGNOSIS — K317 Polyp of stomach and duodenum: Secondary | ICD-10-CM

## 2022-12-26 MED ORDER — SODIUM CHLORIDE 0.9 % IV SOLN
500.0000 mL | Freq: Once | INTRAVENOUS | Status: DC
Start: 2022-12-26 — End: 2022-12-26

## 2022-12-26 NOTE — Op Note (Signed)
[facility] Patient Name: Marvin Frost Procedure Date: 12/26/2022 3:05 PM MRN: [id number] Endoscopist: Iva Boop , MD, [id number] Age: 77 Referring MD:  Date of Birth: Aug 09, 1945 Gender: Male Account #: [id number] Procedure:                Upper GI endoscopy Indications:              Heme positive stool, Diarrhea Medicines:                Monitored Anesthesia Care Procedure:                Pre-Anesthesia Assessment:                           - Prior to the procedure, a History and Physical                            was performed, and patient medications and                            allergies were reviewed. The patient's tolerance of                            previous anesthesia was also reviewed. The risks                            and benefits of the procedure and the sedation                            options and risks were discussed with the patient.                            All questions were answered, and informed consent                            was obtained. Prior Anticoagulants: The patient has                            taken no anticoagulant or antiplatelet agents. ASA                            Grade Assessment: III - A patient with severe                            systemic disease. After reviewing the risks and                            benefits, the patient was deemed in satisfactory                            condition to undergo the procedure.                           After obtaining informed consent, the endoscope was  passed under direct vision. Throughout the                            procedure, the patient's blood pressure, pulse, and                            oxygen saturations were monitored continuously. The                            GIF [id number] #[id number] was introduced through the                            mouth, and advanced to the second part of duodenum.                            The upper GI endoscopy was  accomplished without                            difficulty. The patient tolerated the procedure                            well. Scope In: Scope Out: Findings:                 Multiple 2 to 8 mm sessile polyps with no stigmata                            of recent bleeding were found in the cardia, in the                            gastric fundus, in the gastric body and in the                            gastric antrum. Biopsies were taken with a cold                            forceps for histology. Verification of patient                            identification for the specimen was done. Estimated                            blood loss was minimal.                           A 4 cm hiatal hernia was present.                           The exam was otherwise without abnormality.                           The cardia and gastric fundus were otherwise normal  on retroflexion. Complications:            No immediate complications. Estimated Blood Loss:     Estimated blood loss was minimal. Impression:               - Multiple gastric polyps. Biopsied. Look similar                            to 2021 - some sligghtly larger. cardia polyps were                            hyperplastic then and others were fundic gland.                           - 4 cm hiatal hernia.                           - The examination was otherwise normal. I did take                            duodenal biopsies to r/o sprue-like changes from                            ol[place] Recommendation:           - Patient has a contact number available for                            emergencies. The signs and symptoms of potential                            delayed complications were discussed with the                            patient. Return to normal activities tomorrow.                            Written discharge instructions were provided to the                            patient.                            - See the other procedure note for documentation of                            additional recommendations.                           - Await pathology results. Iva Boop, MD 12/26/2022 3:52:12 PM This report has been signed electronically.

## 2022-12-26 NOTE — Patient Instructions (Addendum)
There were stomach polyps again - some look a little larger - all look benign, still. Biopsies taken. Small intestine and colon biopsies also taken to evaluate diarrhea.  You do have diverticulosis - thickened muscle rings and pouches in the colon wall. Please read the handout about this condition.  No colon polyps. No signs of cancer.  I will let you know results and recommendations soon.  I appreciate the opportunity to care for you. Iva Boop, MD, [facility]  Handout provided on diverticulosis and hiatal hernia.  Resume previous diet.  Continue present medications.  Await pathology results.  No repeat colonoscopy due to age.    YOU HAD AN ENDOSCOPIC PROCEDURE TODAY AT THE [facility]:   Refer to the procedure report that was given to you for any specific questions about what was found during the examination.  If the procedure report does not answer your questions, please call your gastroenterologist to clarify.  If you requested that your care partner not be given the details of your procedure findings, then the procedure report has been included in a sealed envelope for you to review at your convenience later.  YOU SHOULD EXPECT: Some feelings of bloating in the abdomen. Passage of more gas than usual.  Walking can help get rid of the air that was put into your GI tract during the procedure and reduce the bloating. If you had a lower endoscopy (such as a colonoscopy or flexible sigmoidoscopy) you may notice spotting of blood in your stool or on the toilet paper. If you underwent a bowel prep for your procedure, you may not have a normal bowel movement for a few days.  Please Note:  You might notice some irritation and congestion in your nose or some drainage.  This is from the oxygen used during your procedure.  There is no need for concern and it should clear up in a day or so.  SYMPTOMS TO REPORT IMMEDIATELY:  Following lower endoscopy (colonoscopy or flexible  sigmoidoscopy):  Excessive amounts of blood in the stool  Significant tenderness or worsening of abdominal pains  Swelling of the abdomen that is new, acute  Fever of 100F or higher  Following upper endoscopy (EGD)  Vomiting of blood or coffee ground material  New chest pain or pain under the shoulder blades  Painful or persistently difficult swallowing  New shortness of breath  Fever of 100F or higher  Black, tarry-looking stools  For urgent or emergent issues, a gastroenterologist can be reached at any hour by calling [phone]. Do not use MyChart messaging for urgent concerns.    DIET:  We do recommend a small meal at first, but then you may proceed to your regular diet.  Drink plenty of fluids but you should avoid alcoholic beverages for 24 hours.  ACTIVITY:  You should plan to take it easy for the rest of today and you should NOT DRIVE or use heavy machinery until tomorrow (because of the sedation medicines used during the test).    FOLLOW UP: Our staff will call the number listed on your records the next business day following your procedure.  We will call around 7:15- 8:00 am to check on you and address any questions or concerns that you may have regarding the information given to you following your procedure. If we do not reach you, we will leave a message.     If any biopsies were taken you will be contacted by phone or by letter within the  next 1-3 weeks.  Please call us at [phone] if you have not heard about the biopsies in 3 weeks.    SIGNATURES/CONFIDENTIALITY: You and/or your care partner have signed paperwork which will be entered into your electronic medical record.  These signatures attest to the fact that that the information above on your After Visit Summary has been reviewed and is understood.  Full responsibility of the confidentiality of this discharge information lies with you and/or your care-partner.

## 2022-12-26 NOTE — Progress Notes (Signed)
Called to room to assist during endoscopic procedure.  Patient ID and intended procedure confirmed with present staff. Received instructions for my participation in the procedure from the performing physician.  

## 2022-12-26 NOTE — Op Note (Signed)
[facility] Patient Name: Marvin Frost Procedure Date: 12/26/2022 3:04 PM MRN: [id number] Endoscopist: Iva Boop , MD, [id number] Age: 77 Referring MD:  Date of Birth: 1946/03/29 Gender: Male Account #: [id number] Procedure:                Colonoscopy Indications:              Chronic diarrhea, Clinically significant diarrhea                            of unexplained origin, Heme positive stool Medicines:                Monitored Anesthesia Care Procedure:                Pre-Anesthesia Assessment:                           - Prior to the procedure, a History and Physical                            was performed, and patient medications and                            allergies were reviewed. The patient's tolerance of                            previous anesthesia was also reviewed. The risks                            and benefits of the procedure and the sedation                            options and risks were discussed with the patient.                            All questions were answered, and informed consent                            was obtained. Prior Anticoagulants: The patient has                            taken no anticoagulant or antiplatelet agents. ASA                            Grade Assessment: III - A patient with severe                            systemic disease. After reviewing the risks and                            benefits, the patient was deemed in satisfactory                            condition to undergo the procedure.  After obtaining informed consent, the colonoscope                            was passed under direct vision. Throughout the                            procedure, the patient's blood pressure, pulse, and                            oxygen saturations were monitored continuously. The                            Olympus Scope [id number] was introduced through the                            anus and  advanced to the the terminal ileum, with                            identification of the appendiceal orifice and IC                            valve. The colonoscopy was performed without                            difficulty. The patient tolerated the procedure                            well. The quality of the bowel preparation was                            good. The terminal ileum, ileocecal valve,                            appendiceal orifice, and rectum were photographed. Scope In: 3:28:02 PM Scope Out: 3:41:22 PM Scope Withdrawal Time: 0 hours 10 minutes 53 seconds  Total Procedure Duration: 0 hours 13 minutes 20 seconds  Findings:                 The perianal and digital rectal examinations were                            normal.                           The terminal ileum appeared normal.                           Multiple diverticula were found in the sigmoid                            colon.                           The exam was otherwise without abnormality on  direct and retroflexion views.                           Biopsies for histology were taken with a cold                            forceps from the entire colon for evaluation of                            microscopic colitis. Complications:            No immediate complications. Estimated Blood Loss:     Estimated blood loss was minimal. Impression:               - The examined portion of the ileum was normal.                           - Diverticulosis in the sigmoid colon.                           - The examination was otherwise normal on direct                            and retroflexion views.                           - Biopsies were taken with a cold forceps from the                            entire colon for evaluation of microscopic colitis. Recommendation:           - Patient has a contact number available for                            emergencies. The signs and symptoms of  potential                            delayed complications were discussed with the                            patient. Return to normal activities tomorrow.                            Written discharge instructions were provided to the                            patient.                           - Resume previous diet.                           - Continue present medications.                           - Await pathology results.                           -  No repeat colonoscopy due to age. Iva Boop, MD 12/26/2022 3:54:34 PM This report has been signed electronically.

## 2022-12-26 NOTE — Progress Notes (Signed)
VS by DT  Pt's states no medical or surgical changes since previsit or office visit.  

## 2022-12-26 NOTE — Progress Notes (Signed)
History and Physical Interval Note:  12/26/2022 3:03 PM  Marvin Frost  has presented today for endoscopic procedure(s), with the diagnosis of  Encounter Diagnoses  Name Primary?   Diarrhea, unspecified type Yes   Gastric polyps    Heme + stool   .  The various methods of evaluation and treatment have been discussed with the patient and/or family. After consideration of risks, benefits and other options for treatment, the patient has consented to  the endoscopic procedure(s).   The patient's history has been reviewed, patient examined, no change in status, stable for endoscopic procedure(s).  I have reviewed the patient's chart and labs.  Questions were answered to the patient's satisfaction.     Iva Boop, MD, [facility]

## 2022-12-26 NOTE — Progress Notes (Signed)
Vss nad trans to pacu 

## 2022-12-27 ENCOUNTER — Telehealth: Payer: Self-pay | Admitting: *Deleted

## 2022-12-27 NOTE — Telephone Encounter (Signed)
  Follow up Call-     12/26/2022    2:18 PM  Call back number  Post procedure Call Back phone  # [phone]  Permission to leave phone message Yes     Patient questions:  Do you have a fever, pain , or abdominal swelling? No. Pain Score  0 *  Have you tolerated food without any problems? Yes.    Have you been able to return to your normal activities? Yes.    Do you have any questions about your discharge instructions: Diet   No. Medications  No. Follow up visit  No.  Do you have questions or concerns about your Care? No.  Actions: * If pain score is 4 or above: No action needed, pain <4.

## 2023-01-03 ENCOUNTER — Telehealth: Payer: Self-pay | Admitting: Internal Medicine

## 2023-01-03 NOTE — Telephone Encounter (Signed)
Called results from EGd and colonoscopy  He is better- no diaarrhea  Observe and f/u prn  1. Surgical [P], duodenal bulb and 2nd portion of duodenum DUODENAL MUCOSA WITH NORMAL VILLOUS ARCHITECTURE. NEGATIVE FOR VILLOUS ATROPHY OR INCREASED INTRAEPITHELIAL LYMPHOCYTES. 2. Surgical [P], gastric antrum polyps FUNDIC GLAND POLYPS. NEGATIVE FOR HELICOBACTER PYLORI. 3. Surgical [P], gastric body polyps FUNDIC GLAND POLYPS. NEGATIVE FOR HELICOBACTER PYLORI. 4. 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 5. Surgical [P], colon nos, random sites COLONIC MUCOSA WITH NO SIGNIFICANT PATHOLOGIC CHANGES. NO MICROSCOPIC COLITIS, ACTIVE INFLAMMATION OR GRANULOMAS. Jimmy Picket MD Pathologist, Electronic Signature (Case signed 01/02/2023)

## 2023-02-13 ENCOUNTER — Ambulatory Visit: Payer: Medicare Other | Admitting: Nurse Practitioner

## 2023-02-14 ENCOUNTER — Ambulatory Visit: Payer: Medicare Other | Admitting: Internal Medicine

## 2023-03-01 ENCOUNTER — Encounter: Payer: Self-pay | Admitting: Nurse Practitioner

## 2023-03-01 ENCOUNTER — Ambulatory Visit (INDEPENDENT_AMBULATORY_CARE_PROVIDER_SITE_OTHER): Payer: Medicare Other | Admitting: Nurse Practitioner

## 2023-03-01 VITALS — BP 121/75 | HR 85 | Temp 98.3°F | Resp 20 | Ht 68.0 in | Wt 194.0 lb

## 2023-03-01 DIAGNOSIS — R0981 Nasal congestion: Secondary | ICD-10-CM

## 2023-03-01 DIAGNOSIS — J069 Acute upper respiratory infection, unspecified: Secondary | ICD-10-CM | POA: Diagnosis not present

## 2023-03-01 LAB — VERITOR FLU A/B WAIVED
Influenza A: NEGATIVE
Influenza B: NEGATIVE

## 2023-03-01 NOTE — Patient Instructions (Addendum)
1. Take meds as prescribed 2. Use a cool mist humidifier especially during the winter months and when heat has been humid. 3. Use saline nose sprays frequently 4. Saline irrigations of the nose can be very helpful if done frequently.  * 4X daily for 1 week*  * Use of a nettie pot can be helpful with this. Follow directions with this* 5. Drink plenty of fluids 6. Keep thermostat turn down low 7.For any cough or congestion- mucinexQuarantine and Isolation Quarantine and isolation are ways to protect the public from diseases that could: Harm a person's health. Spread easily (very contagious). Isolation is when you are sick and have to stay home and away from healthy people. Anne Shutter is when you have to stay home for a certain amount of time after being around a sick person. This is to see if you become sick. What diseases do I need to quarantine or isolate for? You may need to quarantine or isolate if you have been diagnosed or around someone with: Cholera. Diphtheria. Infectious TB (tuberculosis). Plague. Smallpox. Yellow fever. Viral hemorrhagic fevers, such as [place], Ebola, and [place]. Severe acute respiratory syndromes, such as COVID-19. Flu that can cause a pandemic. Measles. When should I quarantine? You should quarantine when you've been in close contact with someone who has, or is thought to have, a contagious disease. Close contact means you've been less than 6 ft (1.8 m) away from the person for 15 minutes or more within a 24-hour period. When should I isolate? You should isolate when: You are sick with a contagious disease. You test positive for a contagious disease, even if you don't have symptoms. You are sick and think you have a contagious disease. Get tested if you think you have a contagious disease. If your test results are negative, you can stop isolation. Sometimes, two negative tests are needed. If your test results are positive, isolate as told by your  health care provider or health officials. Follow these instructions at home: Medicines  Take over-the-counter and prescription medicines as told by your provider. If you were prescribed antibiotics, take them as told by your provider. Do not stop using the antibiotic even if you start to feel better. Stay up to date on your vaccines. Get vaccines and booster shots as recommended. Lifestyle When you're in quarantine or isolation: Wear a high-quality and well-fitted mask if you must be around others at home or in public. Do not get close to people who may get very sick. Use a bathroom that you don't have to share with others, if you can. Do not share personal items like cups, towels, and eating utensils. Practice good hygiene. Wash your hands often. Improve the air flow in your home by opening a window or door. This may stop the disease from spreading to others.  General instructions Do not travel if you are in quarantine or isolation. Travel only when your provider or health officials say it's okay. Call your provider or health department if you need advice on how to care for yourself. Talk to your provider if you are immunocompromised. This means your body cannot fight infections easily. Your body may not respond as well to vaccines. If you are sick, closely watch your symptoms. Follow instructions from your provider. You may be told to rest, drink fluids, and take medicine. Follow guidelines for quarantine and isolation, especially if you are in a place where diseases can spread easily and quickly. These places include jails, homeless shelters, and cruise ships.  Where to find more information Centers for Disease Control and Prevention (CDC): TonerPromos.no This information is not intended to replace advice given to you by your health care provider. Make sure you discuss any questions you have with your health care provider. Document Revised: 06/20/2022 Document Reviewed: 02/24/2022 Elsevier  Patient Education  2024 Elsevier Inc.  8. For fever or aces or pains- take tylenol or ibuprofen appropriate for age and weight.  * for fevers greater than 101 orally you may alternate ibuprofen and tylenol every  3 hours.

## 2023-03-01 NOTE — Progress Notes (Signed)
Subjective:    Patient ID: Marvin Frost, male    DOB: 1945-07-26, 77 y.o.   MRN: [id number]   Chief Complaint: Fever, Chills, and Cough   URI  This is a new problem. The current episode started in the past 7 days. The problem has been waxing and waning. Maximum temperature: says he had one but he did not check it. Associated symptoms include congestion, coughing, headaches, rhinorrhea and sneezing. Pertinent negatives include no sinus pain. He has tried acetaminophen for the symptoms. The treatment provided mild relief.    Patient Active Problem List   Diagnosis Date Noted   Cervical radiculopathy 09/14/2020   Educated about COVID-19 virus infection 03/28/2020   Palpitations 12/30/2019   DM type 2 causing vascular disease (HCC) 01/16/2019   History of ASCVD 09/27/2018   Combined hyperlipidemia associated with type 2 diabetes mellitus (HCC) 09/27/2018   Iron deficiency anemia due to chronic blood loss 08/20/2018   CAD S/P percutaneous coronary angioplasty 05/29/2018   Unstable angina (HCC) 05/27/2018   Neck pain 11/27/2017   Degeneration of lumbar intervertebral disc 11/06/2017   Low back pain 10/11/2017   Dyslipidemia 09/21/2017   Coronary artery disease involving native coronary artery of native heart without angina pectoris 09/21/2017   Pain of left hip joint 09/20/2017   Gastric polyps 09/18/2017   Diabetic retinopathy (HCC) 08/01/2017   S/P left THA, AA 06/22/2015   Primary osteoarthritis of both knees 01/26/2015   Nonspecific (abnormal) findings on radiological and other examination of gastrointestinal tract 09/27/2010   Mixed hyperlipidemia 03/18/2009   OBESITY 03/18/2009   Essential hypertension, benign 03/18/2009   PAC 03/18/2009   GERD 03/18/2009       Review of Systems  Constitutional:  Positive for chills and fever.  HENT:  Positive for congestion, rhinorrhea and sneezing. Negative for sinus pain.   Respiratory:  Positive for cough.   Neurological:  Positive  for headaches.       Objective:   Physical Exam Vitals reviewed.  Constitutional:      Appearance: Normal appearance.  HENT:     Right Ear: Tympanic membrane normal.     Nose: Congestion and rhinorrhea present.  Eyes:     Pupils: Pupils are equal, round, and reactive to light.  Cardiovascular:     Rate and Rhythm: Normal rate and regular rhythm.     Heart sounds: Normal heart sounds.  Pulmonary:     Effort: Pulmonary effort is normal.     Breath sounds: Normal breath sounds.  Skin:    General: Skin is warm.  Neurological:     General: No focal deficit present.     Mental Status: He is alert and oriented to person, place, and time.  Psychiatric:        Mood and Affect: Mood normal.        Behavior: Behavior normal.    BP 121/75   Pulse 85   Temp 98.3 F (36.8 C) (Temporal)   Resp 20   Ht 5\' 8"  (1.727 m)   Wt 194 lb (88 kg)   SpO2 97%   BMI 29.50 kg/m    Flu negative     Assessment & Plan:   Marvin Frost in today with chief complaint of Fever, Chills, and Cough   1. Nasal congestion - Veritor Flu A/B Waived - Novel Coronavirus, NAA ([facility])  2. URI with cough and congestion 1. Take meds as prescribed 2. Use a cool mist humidifier especially during the winter months and  when heat has been humid. 3. Use saline nose sprays frequently 4. Saline irrigations of the nose can be very helpful if done frequently.  * 4X daily for 1 week*  * Use of a nettie pot can be helpful with this. Follow directions with this* 5. Drink plenty of fluids 6. Keep thermostat turn down low 7.For any cough or congestion- mucinex 8. For fever or aces or pains- take tylenol or ibuprofen appropriate for age and weight.  * for fevers greater than 101 orally you may alternate ibuprofen and tylenol every  3 hours.   Covid results pending    The above assessment and management plan was discussed with the patient. The patient verbalized understanding of and has agreed to the management  plan. Patient is aware to call the clinic if symptoms persist or worsen. Patient is aware when to return to the clinic for a follow-up visit. Patient educated on when it is appropriate to go to the emergency department.   Mary-Margaret Daphine Deutscher, FNP

## 2023-03-02 LAB — NOVEL CORONAVIRUS, NAA: SARS-CoV-2, NAA: DETECTED — AB

## 2023-03-02 MED ORDER — NIRMATRELVIR/RITONAVIR (PAXLOVID)TABLET: 3.0000 | ORAL_TABLET | Freq: Two times a day (BID) | ORAL | 0 refills | Status: AC

## 2023-03-02 NOTE — Addendum Note (Signed)
Addended by: Bennie Pierini on: 03/02/2023 02:13 PM   Modules accepted: Orders

## 2023-03-05 ENCOUNTER — Other Ambulatory Visit: Payer: Self-pay | Admitting: Nurse Practitioner

## 2023-03-22 ENCOUNTER — Ambulatory Visit: Payer: Medicare Other | Admitting: Nurse Practitioner

## 2023-03-22 DIAGNOSIS — Z7985 Long-term (current) use of injectable non-insulin antidiabetic drugs: Secondary | ICD-10-CM

## 2023-03-22 DIAGNOSIS — I10 Essential (primary) hypertension: Secondary | ICD-10-CM

## 2023-03-22 DIAGNOSIS — E782 Mixed hyperlipidemia: Secondary | ICD-10-CM

## 2023-03-22 DIAGNOSIS — E1159 Type 2 diabetes mellitus with other circulatory complications: Secondary | ICD-10-CM

## 2023-03-22 DIAGNOSIS — Z7984 Long term (current) use of oral hypoglycemic drugs: Secondary | ICD-10-CM

## 2023-03-22 DIAGNOSIS — Z794 Long term (current) use of insulin: Secondary | ICD-10-CM

## 2023-03-26 ENCOUNTER — Encounter: Payer: Self-pay | Admitting: Nurse Practitioner

## 2023-03-26 ENCOUNTER — Ambulatory Visit (INDEPENDENT_AMBULATORY_CARE_PROVIDER_SITE_OTHER): Payer: Medicare Other | Admitting: Nurse Practitioner

## 2023-03-26 VITALS — BP 116/71 | HR 67 | Ht 68.0 in | Wt 196.6 lb

## 2023-03-26 DIAGNOSIS — E782 Mixed hyperlipidemia: Secondary | ICD-10-CM | POA: Diagnosis not present

## 2023-03-26 DIAGNOSIS — Z794 Long term (current) use of insulin: Secondary | ICD-10-CM

## 2023-03-26 DIAGNOSIS — E1159 Type 2 diabetes mellitus with other circulatory complications: Secondary | ICD-10-CM

## 2023-03-26 DIAGNOSIS — Z7985 Long-term (current) use of injectable non-insulin antidiabetic drugs: Secondary | ICD-10-CM

## 2023-03-26 DIAGNOSIS — Z7984 Long term (current) use of oral hypoglycemic drugs: Secondary | ICD-10-CM | POA: Diagnosis not present

## 2023-03-26 DIAGNOSIS — I10 Essential (primary) hypertension: Secondary | ICD-10-CM | POA: Diagnosis not present

## 2023-03-26 MED ORDER — FREESTYLE LIBRE 3 PLUS SENSOR MISC: 3 refills | Status: AC

## 2023-03-26 MED ORDER — SEMAGLUTIDE (1 MG/DOSE) 4 MG/3ML ~~LOC~~ SOPN: 1.0000 mg | PEN_INJECTOR | SUBCUTANEOUS | 0 refills | Status: DC

## 2023-03-26 MED ORDER — FREESTYLE LIBRE 3 READER DEVI: 1.0000 | Freq: Once | 0 refills | Status: AC

## 2023-03-26 NOTE — Progress Notes (Unsigned)
03/26/2023, 3:53 PM     Endocrinology follow-up note   Subjective:    Patient ID: Marvin Frost, male    DOB: 12-Nov-1945.   Marvin Frost is being seen in follow-up after he was seen in consultation for management of currently uncontrolled symptomatic diabetes. -He also has dyslipidemia, hypertension. PMD:   Mechele Claude, MD.   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    Cataract    both removed   Cervical radiculopathy 09/14/2020   Diabetes mellitus 1995   Dysrhythmia    history of heart palpitations    Gastric polyps 09/18/2017   GERD (gastroesophageal reflux disease)    History of frequent urinary tract infections    History of hiatal hernia    Hyperlipemia    Hypertension    Obesity    Pancreatitis     Past Surgical History:  Procedure Laterality Date   CARDIAC CATHETERIZATION     2004 and 1999- done by Dr Antoine Poche    COLONOSCOPY  11/2003   diverticulosis, no polyps   CORONARY PRESSURE/FFR STUDY N/A 05/28/2018   Procedure: INTRAVASCULAR PRESSURE WIRE/FFR STUDY;  Surgeon: Tonny Bollman, MD;  Location: [facility] INVASIVE CV LAB;  Service: Cardiovascular;  Laterality: N/A;   CORONARY STENT INTERVENTION N/A 05/28/2018   Procedure: CORONARY STENT INTERVENTION;  Surgeon: Tonny Bollman, MD;  Location: [facility] INVASIVE CV LAB;  Service: Cardiovascular;  Laterality: N/A;   EYE SURGERY Bilateral    cateracts - dr Dione Booze   HYDROCELE EXCISION / REPAIR     JOINT REPLACEMENT  2017   both hips   left calf surgery      related to trauma of being torn    LEFT HEART CATH AND CORONARY ANGIOGRAPHY N/A 05/28/2018   Procedure: LEFT HEART CATH AND CORONARY ANGIOGRAPHY;  Surgeon: Tonny Bollman, MD;  Location: [facility] INVASIVE CV LAB;  Service: Cardiovascular;   Laterality: N/A;   left index finger arthrioscopic surgery      lymph node removed from right neck      right shoulder surgery     TOTAL HIP ARTHROPLASTY Right 03/16/2015   Procedure: RIGHT TOTAL HIP ARTHROPLASTY ANTERIOR APPROACH;  Surgeon: Durene Romans, MD;  Location: [facility];  Service: Orthopedics;  Laterality: Right;   TOTAL HIP ARTHROPLASTY Left 06/22/2015   Procedure: LEFT TOTAL HIP ARTHROPLASTY ANTERIOR APPROACH;  Surgeon: Durene Romans, MD;  Location: [facility];  Service: Orthopedics;  Laterality: Left;   UPPER GASTROINTESTINAL ENDOSCOPY     hiatal hernia 2cm, mild gastritis - 2012, multiple hyperplastic and fundic gland gastric polyps 2019    Social History   Socioeconomic History   Marital status: Married    Spouse name: Dondra Spry   Number of children: 1   Years of education: Some College   Highest education level: Associate degree: occupational, Scientist, product/process development, or vocational program  Occupational History   Occupation: Event organiser: [employer]    Comment: retired  Tobacco Use   Smoking status: Former    Current packs/day: 0.00    Average packs/day: 1 pack/day for 10.0 years (10.0 ttl pk-yrs)    Types: Cigarettes    Start date: 04/06/1974    Quit date: 04/06/1984    Years since quitting: 38.9   Smokeless tobacco: Former    Types: Chew    Quit date: 06/26/1978   Tobacco comments:    I do not smoke  Vaping Use   Vaping status: Never Used  Substance and Sexual Activity   Alcohol use: No   Drug use: No   Sexual activity: Yes    Birth control/protection: None  Other Topics Concern   Not on file  Social History Narrative   Married   Retired from [employer]   1 child   Former but no current tobacco   No EtOH/drugs   Social Determinants of Corporate investment banker Strain: Low Risk  (11/18/2022)   Overall Financial Resource Strain (CARDIA)    Difficulty of Paying Living Expenses: Not very hard  Food Insecurity: No Food Insecurity (11/18/2022)   Hunger  Vital Sign    Worried About Running Out of Food in the Last Year: Never true    Ran Out of Food in the Last Year: Never true  Transportation Needs: No Transportation Needs (11/18/2022)   PRAPARE - Administrator, Civil Service (Medical): No    Lack of Transportation (Non-Medical): No  Physical Activity: Insufficiently Active (11/18/2022)   Exercise Vital Sign    Days of Exercise per Week: 3 days    Minutes of Exercise per Session: 30 min  Stress: No Stress Concern Present (11/18/2022)   [employer] of Occupational Health - Occupational Stress Questionnaire    Feeling of Stress : Not at all  Social Connections: Moderately Integrated (11/18/2022)   Social Connection and Isolation Panel [NHANES]    Frequency of Communication with Friends and Family: Once a week    Frequency of Social Gatherings with Friends and Family: Once a week    Attends Religious Services: More than 4 times per year    Active Member of [employer] or Organizations: Yes    Attends Engineer, structural: More than 4 times per year    Marital Status: Married    Family History  Problem Relation Age of Onset   Heart disease Mother    Heart attack Mother    Diabetes Brother    Heart disease Brother    Heart attack Brother 53   Heart attack Father 38   Early death Father    Heart disease Father    GI problems Sister        torn esophagus during endoscopy   Healthy Daughter    Colon polyps Neg Hx    Esophageal cancer Neg Hx    Rectal cancer Neg Hx    Stomach cancer Neg Hx    Colon cancer Neg Hx     Outpatient Encounter Medications as of 03/26/2023  Medication Sig   aspirin EC 81 MG EC tablet Take 1 tablet (81 mg total) by mouth daily.   cetirizine (ZYRTEC) 10 MG tablet Take 10 mg by mouth as needed for allergies or rhinitis.    Continuous Blood Gluc Sensor (FREESTYLE LIBRE 14 DAY SENSOR) MISC Inject 1 each into the skin every 14 (fourteen) days. Use as directed.   Dulaglutide (TRULICITY) 0.75  MG/0.5ML SOPN Inject 0.75 mg into the  skin once a week.   Evolocumab (REPATHA SURECLICK) 140 MG/ML SOAJ INJECT 140 MG UNDER THE SKIN EVERY 14 DAYS   ferrous sulfate 325 (65 FE) MG tablet Take 1 tablet (325 mg total) by mouth every other day. (Patient taking differently: Take 325 mg by mouth every morning.)   fluocinolone (SYNALAR) 0.01 % external solution Apply topically 2 (two) times daily. To scalp   fluticasone (FLONASE) 50 MCG/ACT nasal spray Place 2 sprays into both nostrils daily.   insulin glargine (LANTUS) 100 UNIT/ML injection Inject 0.75 mLs (75 Units total) into the skin at bedtime.   Insulin Pen Needle (NOVOTWIST) 32G X 5 MM MISC USE WITH INSULIN PENS UP TO 5 TIMES DAILY Dx E11.59   Insulin Pen Needle 32G X 5 MM MISC by Does not apply route. Use with insulin pen needles up to 5 times daily  E11.9   Lancets (FREESTYLE) lancets Check BS BID and PRN. DX.E11.9   metFORMIN (GLUCOPHAGE-XR) 500 MG 24 hr tablet Take 2 tablets (1,000 mg total) by mouth daily with breakfast.   metoprolol succinate (TOPROL-XL) 100 MG 24 hr tablet TAKE 1 TABLET DAILY FOR BLOOD PRESSURE CONTROL   nitroGLYCERIN (NITROSTAT) 0.4 MG SL tablet DISSOLVE 1 TABLET UNDER THE TONGUE EVERY 5 MINUTES AS NEEDED FOR CHEST PAIN   olmesartan-hydrochlorothiazide (BENICAR HCT) 40-25 MG tablet Take 1 tablet by mouth daily.   pantoprazole (PROTONIX) 40 MG tablet Take 1 tablet (40 mg total) by mouth daily.   rosuvastatin (CRESTOR) 40 MG tablet Take 1 tablet (40 mg total) by mouth every 3 (three) days.   TRULICITY 1.5 MG/0.5ML SOPN INJECT 1.5 MG UNDER THE SKIN ONCE A WEEK   Dulaglutide (TRULICITY) 4.5 MG/0.5ML SOPN INJECT 4.5 MG INTO THE SKIN ONCE A WEEK (Patient not taking: Reported on 03/26/2023)   No facility-administered encounter medications on file as of 03/26/2023.    ALLERGIES: Allergies  Allergen Reactions   Brilinta [Ticagrelor]     Rash and SOB    Liraglutide Other (See Comments)    Abdominal pain and enlarged pancreas  on CT ? pancreatitis   Baxinets [Cetylpyridinium-Benzocaine] Other (See Comments)    Unknown reaction   Celebrex [Celecoxib] Nausea Only   Toprol Xl [Metoprolol Succinate] Other (See Comments)    Pt. Has not had any issues with this for many years    Triplex Ad Other (See Comments)    Abdominal pain and enlarged pancreas on CT? pancreatitis   Welchol [Colesevelam Hcl] Other (See Comments)    constipation   Norvasc [Amlodipine Besylate] Rash   Statins Other (See Comments)    muscle and joint pain if taken for long periods of time, takes Crestor every 2-3 days to avoid this reaction    VACCINATION STATUS: Immunization History  Administered Date(s) Administered   Fluad Quad(high Dose 65+) 03/26/2019, 04/19/2020, 05/12/2021, 03/30/2022   Influenza Whole 02/24/2010   Influenza, High Dose Seasonal PF 04/15/2015, 04/12/2016, 04/05/2017, 04/16/2018   Influenza,inj,Quad PF,6+ Mos 03/26/2013, 03/30/2014   Moderna Sars-Covid-2 Vaccination 07/31/2019, 08/28/2019, 03/15/2020   Pneumococcal Conjugate-13 06/17/2013   Pneumococcal Polysaccharide-23 02/24/2009   Td 02/24/2006   Tdap 04/27/2011   Zoster Recombinant(Shingrix) 07/22/2018   Zoster, Live 07/27/2006    Diabetes He presents for his follow-up diabetic visit. He has type 2 diabetes mellitus. Onset time: He was diagnosed at approximate age of 48 years. His disease course has been improving. There are no hypoglycemic associated symptoms. Pertinent negatives for hypoglycemia include no confusion, headaches, pallor or seizures. Pertinent negatives for diabetes include  no chest pain, no fatigue, no polydipsia, no polyphagia, no polyuria, no weakness and no weight loss. There are no hypoglycemic complications. Symptoms are stable. Diabetic complications include heart disease and nephropathy. Risk factors for coronary artery disease include diabetes mellitus, dyslipidemia, family history, male sex, hypertension, sedentary lifestyle, tobacco exposure  and obesity. Current diabetic treatment includes oral agent (monotherapy) and insulin injections (Trulicity). He is compliant with treatment most of the time. His weight is fluctuating minimally. He is following a generally healthy diet. When asked about meal planning, he reported none. He has not had a previous visit with a dietitian. He participates in exercise intermittently. His home blood glucose trend is decreasing steadily. His overall blood glucose range is 180-200 mg/dl. (He presents today with his CGM and logs showing improving glycemic profile.  His POCT A1c today is 8.8%, improving from last visit of 9.2%.   Analysis of his CGM shows TIR 41%, TAR 59%, TBR 0% with a GMI of 7.9%.  He denies any significant hypoglycemia (has woken up in the 60s on several occasions).) An ACE inhibitor/angiotensin II receptor blocker is being taken. He does not see a podiatrist.Eye exam is current.  Hyperlipidemia This is a chronic problem. The current episode started more than 1 year ago. The problem is controlled. Recent lipid tests were reviewed and are normal. Exacerbating diseases include chronic renal disease, diabetes and obesity. Factors aggravating his hyperlipidemia include beta blockers. Pertinent negatives include no chest pain, myalgias or shortness of breath. Current antihyperlipidemic treatment includes statins (Repatha every 2 weeks. Only takes his statin once every few days dt myalgias). The current treatment provides moderate improvement of lipids. Compliance problems include medication side effects.  Risk factors for coronary artery disease include male sex, obesity, family history, dyslipidemia, diabetes mellitus, hypertension and a sedentary lifestyle.  Hypertension This is a chronic problem. The current episode started more than 1 year ago. The problem has been gradually improving since onset. The problem is controlled. Pertinent negatives include no chest pain, headaches, neck pain, palpitations or  shortness of breath. There are no associated agents to hypertension. Risk factors for coronary artery disease include dyslipidemia, diabetes mellitus, family history, male gender, obesity, sedentary lifestyle and smoking/tobacco exposure. Past treatments include angiotensin blockers, diuretics and beta blockers. The current treatment provides moderate improvement. There are no compliance problems.  Hypertensive end-organ damage includes CAD/MI. Identifiable causes of hypertension include chronic renal disease.    Review of systems  Constitutional: + Minimally fluctuating body weight,  current Body mass index is 29.89 kg/m. , no fatigue, no subjective hyperthermia, no subjective hypothermia Eyes: no blurry vision, no xerophthalmia ENT: no sore throat, no nodules palpated in throat, no dysphagia/odynophagia, no hoarseness Cardiovascular: no chest pain, no shortness of breath, no palpitations, no leg swelling Respiratory: no cough, no shortness of breath Gastrointestinal: no nausea/vomiting/diarrhea- having Endo/Colonoscopy soon Musculoskeletal: no muscle/joint aches Skin: no rashes, no hyperemia Neurological: no tremors, no numbness, no tingling, no dizziness Psychiatric: no depression, no anxiety   Objective:    Ht 5\' 8"  (1.727 m)   Wt 196 lb 9.6 oz (89.2 kg)   BMI 29.89 kg/m   Wt Readings from Last 3 Encounters:  03/26/23 196 lb 9.6 oz (89.2 kg)  03/01/23 194 lb (88 kg)  12/26/22 194 lb (88 kg)    BP Readings from Last 3 Encounters:  03/01/23 121/75  12/26/22 100/61  12/20/22 125/85      Physical Exam- Limited  Constitutional:  Body mass index is 29.89 kg/m. ,  not in acute distress, normal state of mind Eyes:  EOMI, no exophthalmos Musculoskeletal: no gross deformities, strength intact in all four extremities, no gross restriction of joint movements Skin:  no rashes, no hyperemia Neurological: no tremor with outstretched hands   Diabetic Foot Exam - Simple   No data  filed    CMP     Component Value Date/Time   NA 139 11/21/2022 0909   K 4.3 11/21/2022 0909   CL 101 11/21/2022 0909   CO2 23 11/21/2022 0909   GLUCOSE 150 (H) 11/21/2022 0909   GLUCOSE 154 (H) 05/29/2018 0217   BUN 11 11/21/2022 0909   CREATININE 1.10 11/21/2022 0909   CREATININE 0.95 12/31/2012 0857   CALCIUM 9.2 11/21/2022 0909   PROT 5.9 (L) 11/21/2022 0909   ALBUMIN 3.9 11/21/2022 0909   AST 14 11/21/2022 0909   ALT 17 11/21/2022 0909   ALKPHOS 66 11/21/2022 0909   BILITOT 0.4 11/21/2022 0909   GFRNONAA 68 07/07/2020 1028   GFRNONAA 81 12/31/2012 0857   GFRAA 78 07/07/2020 1028   GFRAA >89 12/31/2012 0857    Diabetic Labs (most recent): Lab Results  Component Value Date   HGBA1C 8.8 (A) 12/20/2022   HGBA1C 9.2 (A) 09/13/2022   HGBA1C 8.6 (A) 05/15/2022   MICROALBUR 30 10/04/2021   MICROALBUR 10 12/30/2020   MICROALBUR 20 01/26/2015     Lipid Panel ( most recent) Lipid Panel     Component Value Date/Time   CHOL 90 (L) 11/21/2022 0909   CHOL 157 12/31/2012 0857   TRIG 201 (H) 11/21/2022 0909   TRIG 271 (H) 02/17/2016 0824   TRIG 184 (H) 12/31/2012 0857   HDL 43 11/21/2022 0909   HDL 44 02/17/2016 0824   HDL 45 12/31/2012 0857   CHOLHDL 2.1 11/21/2022 0909   LDLCALC 16 11/21/2022 0909   LDLCALC 52 01/19/2014 1007   LDLCALC 75 12/31/2012 0857   LDLDIRECT 39 11/02/2016 0806      Lab Results  Component Value Date   TSH 3.350 07/07/2020   TSH 2.720 12/30/2019   TSH 2.290 09/03/2014   FREET4 1.42 07/07/2020     Assessment & Plan:   1) DM type 2 causing vascular disease (HCC)  -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  - Marvin Frost has currently uncontrolled symptomatic type 2 DM since  77 years of age.  He presents today with his CGM and logs showing improving glycemic profile.  His POCT A1c today is 8.8%, improving from last visit of 9.2%.   Analysis of his CGM shows TIR 41%, TAR 59%, TBR 0% with a GMI of  7.9%.  He denies any significant hypoglycemia (has woken up in the 60s on several occasions).  He has been more active during these warmer months.  -I have reviewed his recent labs with him.  - I had a long discussion with him about the progressive nature of diabetes and the pathology behind its complications. -his diabetes is complicated by coronary artery disease, obesity and he remains at a high risk for more acute and chronic complications which include CAD, CVA, CKD, retinopathy, and neuropathy. These are all discussed in detail with him.  The following Lifestyle Medicine recommendations according to American College of Lifestyle Medicine [facility]) were discussed and offered to patient and he agrees to start the journey:  A. Whole Foods, Plant-based plate comprising of fruits and vegetables, plant-based proteins, whole-grain carbohydrates was discussed in detail with the patient.  A list for source of those nutrients were also provided to the patient.  Patient will use only water or unsweetened tea for hydration. B.  The need to stay away from risky substances including alcohol, smoking; obtaining 7 to 9 hours of restorative sleep, at least 150 minutes of moderate intensity exercise weekly, the importance of healthy social connections,  and stress reduction techniques were discussed. C.  A full color page of  Calorie density of various food groups per pound showing examples of each food groups was provided to the patient.  - Nutritional counseling repeated at each appointment due to patients tendency to fall back in to old habits.  - The patient admits there is a room for improvement in their diet and drink choices. -  Suggestion is made for the patient to avoid simple carbohydrates from their diet including Cakes, Sweet Desserts / Pastries, Ice Cream, Soda (diet and regular), Sweet Tea, Candies, Chips, Cookies, Sweet Pastries, Store Bought Juices, Alcohol in Excess of 1-2 drinks a day, Artificial  Sweeteners, Coffee Creamer, and "Sugar-free" Products. This will help patient to have stable blood glucose profile and potentially avoid unintended weight gain.   - I encouraged the patient to switch to unprocessed or minimally processed complex starch and increased protein intake (animal or plant source), fruits, and vegetables.   - Patient is advised to stick to a routine mealtimes to eat 3 meals a day and avoid unnecessary snacks (to snack only to correct hypoglycemia).  - I have approached him with the following individualized plan to manage diabetes and patient agrees:   -He is advised to lower Lantus to 75 units SQ nightly, continue Metformin 1000 mg ER daily with breakfast, and increase Trulicity to 1.5 mg SQ weekly (was reduced to 0.75 previously due to medication shortage-worse in higher doses).  I encouraged him to add more fibrous fruits and veggies to his diet and eat more of a well balanced supper as he has been eating a lighter meal.  -He is encouraged to continue monitoring blood glucose using his CGM at least 4 times per day, before meals and at bedtime and call the clinic if he gets readings less than 70 or greater than 200 for 3 tests in a row.  - Patient specific target  A1c;  LDL, HDL, Triglycerides,  were discussed in detail.  2) Blood Pressure /Hypertension:   His blood pressure is controlled to target.  He is advised to continue Metoprolol 100 mg po daily and Benicar HCT 40-25 mg po daily.  He notes recent drops in BP after being outside working, is wondering if his BP meds need to be adjusted.  He has follow up in Sept with cardiology, says he may reach out sooner given his symptoms (getting dizzy with positional changes outdoors).  3) Lipids/Hyperlipidemia: His most recent lipid panel from 11/21/22 shows controlled LDL of 16 and elevated triglycerides of 201(stable) . He is statin intolerant with myalgias.  He has tolerated taking his Crestor every 2-3 days.  He is advised  to continue taking his Crestor 40 mg po every 3 days or so and continue his Repatha 140 mg SQ every 2 weeks.  Side effects and precautions discussed with him.    4)  Weight/Diet:  His Body mass index is 29.89 kg/m.--   He has lost some weight over the last few visits and will benefit from continued weight loss.  I discussed with him the fact that loss of 5 - 10% of his  current body weight will have the most impact on his diabetes management.  CDE Consult will be initiated . Exercise, and detailed carbohydrates information provided  -  detailed on discharge instructions.  5) Chronic Care/Health Maintenance: -he is on ACEI/ARB and Statin medications and is encouraged to initiate and continue to follow up with Ophthalmology, Dentist, Podiatrist at least yearly or according to recommendations, and advised to  stay away from smoking. I have recommended yearly flu vaccine and pneumonia vaccine at least every 5 years; moderate intensity exercise for up to 150 minutes weekly; and  sleep for at least 7 hours a day.  - he is advised to maintain close follow up with Mechele Claude, MD for primary care needs, as well as his other providers for optimal and coordinated care.      I spent  31  minutes in the care of the patient today including review of labs from CMP, Lipids, Thyroid Function, Hematology (current and previous including abstractions from other facilities); face-to-face time discussing  his blood glucose readings/logs, discussing hypoglycemia and hyperglycemia episodes and symptoms, medications doses, his options of short and long term treatment based on the latest standards of care / guidelines;  discussion about incorporating lifestyle medicine;  and documenting the encounter. Risk reduction counseling performed per USPSTF guidelines to reduce obesity and cardiovascular risk factors.     Please refer to Patient Instructions for Blood Glucose Monitoring and Insulin/Medications Dosing Guide"  in  media tab for additional information. Please  also refer to " Patient Self Inventory" in the Media  tab for reviewed elements of pertinent patient history.  Marvin Frost participated in the discussions, expressed understanding, and voiced agreement with the above plans.  All questions were answered to his satisfaction. he is encouraged to contact clinic should he have any questions or concerns prior to his return visit.    Follow up plan: - No follow-ups on file.   Ronny Bacon, [facility] [address] [place], [place] [address] Phone: [phone] Fax: [phone]  03/26/2023, 3:53 PM

## 2023-03-28 ENCOUNTER — Other Ambulatory Visit: Payer: Self-pay

## 2023-03-28 MED ORDER — SEMAGLUTIDE (1 MG/DOSE) 4 MG/3ML ~~LOC~~ SOPN: 1.0000 mg | PEN_INJECTOR | SUBCUTANEOUS | 0 refills | Status: DC

## 2023-04-04 ENCOUNTER — Ambulatory Visit (INDEPENDENT_AMBULATORY_CARE_PROVIDER_SITE_OTHER): Payer: Medicare Other

## 2023-04-04 DIAGNOSIS — Z961 Presence of intraocular lens: Secondary | ICD-10-CM | POA: Diagnosis not present

## 2023-04-04 DIAGNOSIS — E119 Type 2 diabetes mellitus without complications: Secondary | ICD-10-CM | POA: Diagnosis not present

## 2023-04-04 DIAGNOSIS — H04123 Dry eye syndrome of bilateral lacrimal glands: Secondary | ICD-10-CM | POA: Diagnosis not present

## 2023-04-04 DIAGNOSIS — Z23 Encounter for immunization: Secondary | ICD-10-CM

## 2023-04-04 LAB — HM DIABETES EYE EXAM

## 2023-04-18 ENCOUNTER — Telehealth: Payer: Self-pay | Admitting: Nurse Practitioner

## 2023-04-18 ENCOUNTER — Other Ambulatory Visit: Payer: Self-pay | Admitting: Family Medicine

## 2023-04-18 NOTE — Telephone Encounter (Signed)
Patient will call today.

## 2023-04-22 NOTE — Progress Notes (Unsigned)
  Cardiology Office Note:   Date:  04/25/2023  ID:  Marvin Frost, DOB 08/17/1945, MRN [id number] PCP: Mechele Claude, MD  [facility] Providers Cardiologist:  Rollene Rotunda, MD {  History of Present Illness:   Marvin Frost is a 77 y.o. male who presents for follow up of CAD.  He had stenting of his mid LAD in December of 2019.   Since I last saw him he has done well.  He stays very active in the yard.The patient denies any new symptoms such as chest discomfort, neck or arm discomfort. There has been no new shortness of breath, PND or orthopnea. There have been no reported palpitations, presyncope or syncope.   ROS: As stated in the HPI and negative for all other systems.  Studies Reviewed:    EKG:   EKG Interpretation Date/Time:  Wednesday April 25 2023 14:58:34 EDT Ventricular Rate:  72 PR Interval:  172 QRS Duration:  82 QT Interval:  374 QTC Calculation: 409 R Axis:   -59  Text Interpretation: Normal sinus rhythm Left axis deviation When compared with ECG of 29-May-2018 07:09, No significant change since last tracing Confirmed by Rollene Rotunda ([id number]) on 04/25/2023 3:10:18 PM     Risk Assessment/Calculations:              Physical Exam:   VS:  BP 110/80   Pulse 72   Ht 5\' 8"  (1.727 m)   Wt 196 lb 3.2 oz (89 kg)   SpO2 96%   BMI 29.83 kg/m    Wt Readings from Last 3 Encounters:  04/25/23 196 lb 3.2 oz (89 kg)  03/26/23 196 lb 9.6 oz (89.2 kg)  03/01/23 194 lb (88 kg)     GEN: Well nourished, well developed in no acute distress NECK: No JVD; No carotid bruits CARDIAC: RRR, no murmurs, rubs, gallops RESPIRATORY:  Clear to auscultation without rales, wheezing or rhonchi  ABDOMEN: Soft, non-tender, non-distended EXTREMITIES:  No edema; No deformity   ASSESSMENT AND PLAN:   CAD-  The patient has no new sypmtoms.  No further cardiovascular testing is indicated.  We will continue with aggressive risk reduction and meds as listed.  HYPERLIPIDEMIA -  LDL  was 16 with an HDL of 43.  No change in therapy.    HYPERTENSION -  The blood pressure is at target.  No change in therapy.    DM -  A1C was up but he was having trouble getting his Trulicity.  His A1c went to 8.8 or higher.  He is now on semaglutide and has seen improvement.  He is having this followed closely by endocrinology.        Follow up with me in one year.   Signed, Rollene Rotunda, MD

## 2023-04-25 ENCOUNTER — Ambulatory Visit: Payer: Medicare Other | Attending: Cardiology | Admitting: Cardiology

## 2023-04-25 ENCOUNTER — Encounter: Payer: Self-pay | Admitting: Cardiology

## 2023-04-25 VITALS — BP 110/80 | HR 72 | Ht 68.0 in | Wt 196.2 lb

## 2023-04-25 DIAGNOSIS — I2510 Atherosclerotic heart disease of native coronary artery without angina pectoris: Secondary | ICD-10-CM

## 2023-04-25 DIAGNOSIS — I10 Essential (primary) hypertension: Secondary | ICD-10-CM | POA: Diagnosis not present

## 2023-04-25 DIAGNOSIS — E785 Hyperlipidemia, unspecified: Secondary | ICD-10-CM | POA: Diagnosis not present

## 2023-04-25 DIAGNOSIS — E118 Type 2 diabetes mellitus with unspecified complications: Secondary | ICD-10-CM | POA: Diagnosis not present

## 2023-04-25 DIAGNOSIS — Z9861 Coronary angioplasty status: Secondary | ICD-10-CM | POA: Insufficient documentation

## 2023-04-25 NOTE — Patient Instructions (Signed)
Medication Instructions:  Continue same medications *If you need a refill on your cardiac medications before your next appointment, please call your pharmacy*   Lab Work: None ordered   Testing/Procedures: None ordered   Follow-Up: At [facility], you and your health needs are our priority.  As part of our continuing mission to provide you with exceptional heart care, we have created designated Provider Care Teams.  These Care Teams include your primary Cardiologist (physician) and Advanced Practice Providers (APPs -  Physician Assistants and Nurse Practitioners) who all work together to provide you with the care you need, when you need it.  We recommend signing up for the patient portal called "MyChart".  Sign up information is provided on this After Visit Summary.  MyChart is used to connect with patients for Virtual Visits (Telemedicine).  Patients are able to view lab/test results, encounter notes, upcoming appointments, etc.  Non-urgent messages can be sent to your provider as well.   To learn more about what you can do with MyChart, go to [email or web address].    Your next appointment:  1 year    Call in June to schedule Oct appointment     Provider:  Dr.Hochrein

## 2023-05-27 ENCOUNTER — Other Ambulatory Visit: Payer: Self-pay | Admitting: Nurse Practitioner

## 2023-05-28 ENCOUNTER — Ambulatory Visit: Payer: Medicare Other | Admitting: Family Medicine

## 2023-05-28 VITALS — BP 128/79 | HR 73 | Temp 97.0°F | Ht 68.0 in | Wt 195.4 lb

## 2023-05-28 DIAGNOSIS — I10 Essential (primary) hypertension: Secondary | ICD-10-CM

## 2023-05-28 DIAGNOSIS — E782 Mixed hyperlipidemia: Secondary | ICD-10-CM | POA: Diagnosis not present

## 2023-05-28 DIAGNOSIS — K219 Gastro-esophageal reflux disease without esophagitis: Secondary | ICD-10-CM | POA: Diagnosis not present

## 2023-05-28 MED ORDER — OLMESARTAN MEDOXOMIL-HCTZ 40-25 MG PO TABS: 1.0000 | ORAL_TABLET | Freq: Every day | ORAL | 3 refills | Status: DC

## 2023-05-28 NOTE — Progress Notes (Signed)
Subjective:  Patient ID: Marvin Frost, male    DOB: 1945-12-14  Age: 77 y.o. MRN: [id number]  CC: Medical Management of Chronic Issues   HPI Marvin Frost presents for  follow-up of hypertension. Patient has no history of headache chest pain or shortness of breath or recent cough. Patient also denies symptoms of TIA such as focal numbness or weakness. Patient denies side effects from medication. States taking it regularly.  DM better with Ozempic. Follows with Ms. Lurlean Leyden, NP for DM. Average daily glucose dropped to 200 from 240  Using repatha for cholesterol. Helping a lot.  in for follow-up of elevated cholesterol. Doing well without complaints on current medication. Denies side effects of statin including myalgia and arthralgia and nausea. Currently no chest pain, shortness of breath or other cardiovascular related symptoms noted.  Patient in for follow-up of GERD. Currently asymptomatic taking  PPI daily. There is no chest pain or heartburn. No hematemesis and no melena. No dysphagia or choking. Onset is remote. Progression is stable. Complicating factors, none.     History Marvin Frost has a past medical history of Allergy, Anemia, Arthritis, CAD (coronary artery disease), Cancer (HCC), Cataract, Cervical radiculopathy (09/14/2020), Diabetes mellitus (1995), Dysrhythmia, Gastric polyps (09/18/2017), GERD (gastroesophageal reflux disease), History of frequent urinary tract infections, History of hiatal hernia, Hyperlipemia, Hypertension, Obesity, and Pancreatitis.   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   His family history includes Diabetes in his brother; Early death in his father; GI problems in his sister; Healthy in his daughter; Heart attack in his mother; Heart attack (age of onset: 104) in his father; Heart attack (age of onset: 13) in his brother; Heart disease in his brother, father, and mother.He reports that he quit smoking about 39 years ago. His smoking use included cigarettes. He started smoking about 49 years ago. He has a 10 pack-year smoking history. He quit smokeless tobacco use about 44 years ago.  His smokeless tobacco use included chew. He reports that he does not drink alcohol and does not use drugs.  Current Outpatient Medications on File Prior to Visit  Medication Sig Dispense Refill   aspirin EC 81 MG EC tablet Take 1 tablet (81 mg total) by mouth daily.     cetirizine (ZYRTEC) 10 MG tablet Take 10 mg by mouth as needed for allergies or rhinitis.      Continuous Glucose Sensor (FREESTYLE LIBRE 3 PLUS SENSOR) MISC Change sensor every 15 days. 6 each 3   Evolocumab (REPATHA SURECLICK) 140 MG/ML SOAJ INJECT 140 MG UNDER THE SKIN EVERY 14 DAYS 6 mL 1   ferrous sulfate 325 (65 FE) MG tablet Take 1 tablet (325 mg total) by mouth every other day. (Patient taking differently: Take 325 mg by mouth every morning.) 90 tablet 1   fluocinolone (SYNALAR) 0.01 % external solution Apply topically 2 (two) times daily. To scalp 60 mL 5   fluticasone (FLONASE) 50 MCG/ACT nasal spray Place 2 sprays into both nostrils daily. 16 g 6   insulin glargine (LANTUS) 100 UNIT/ML injection Inject 0.75 mLs (75 Units total) into the skin at bedtime.  75 mL 3   Insulin Pen Needle (NOVOTWIST) 32G X 5 MM MISC USE WITH INSULIN PENS UP TO 5 TIMES DAILY Dx E11.59 500 each 4   Insulin Pen Needle 32G X 5 MM MISC by Does not apply route. Use with insulin pen needles up to 5 times  daily  E11.9     Lancets (FREESTYLE) lancets Check BS BID and PRN. DX.E11.9 300 each 3   metFORMIN (GLUCOPHAGE-XR) 500 MG 24 hr tablet Take 2 tablets (1,000 mg total) by mouth daily with breakfast. 180 tablet 3   metoprolol succinate (TOPROL-XL) 100 MG 24 hr tablet TAKE 1 TABLET DAILY FOR BLOOD PRESSURE CONTROL 90 tablet 3   nitroGLYCERIN (NITROSTAT) 0.4 MG SL tablet DISSOLVE 1 TABLET UNDER THE TONGUE EVERY 5 MINUTES AS NEEDED FOR CHEST PAIN 25 tablet 3   pantoprazole (PROTONIX) 40 MG tablet Take 1 tablet (40 mg total) by mouth daily. 90 tablet 3   rosuvastatin (CRESTOR) 40 MG tablet Take 1 tablet (40 mg total) by mouth every 3 (three) days. 90 tablet 3   Semaglutide, 1 MG/DOSE, 4 MG/3ML SOPN Inject 1 mg as directed once a week. 9 mL 0   No current facility-administered medications on file prior to visit.    ROS Review of Systems  Constitutional:  Negative for fever.  Respiratory:  Negative for shortness of breath.   Cardiovascular:  Negative for chest pain.  Musculoskeletal:  Positive for arthralgias (intermittent nonspecific).  Skin:  Negative for rash.    Objective:  BP 128/79   Pulse 73   Temp (!) 97 F (36.1 C)   Ht 5\' 8"  (1.727 m)   Wt 195 lb 6.4 oz (88.6 kg)   SpO2 96%   BMI 29.71 kg/m   BP Readings from Last 3 Encounters:  05/28/23 128/79  04/25/23 110/80  03/26/23 116/71    Wt Readings from Last 3 Encounters:  05/28/23 195 lb 6.4 oz (88.6 kg)  04/25/23 196 lb 3.2 oz (89 kg)  03/26/23 196 lb 9.6 oz (89.2 kg)     Physical Exam Vitals reviewed.  Constitutional:      Appearance: He is well-developed.  HENT:     Head: Normocephalic and atraumatic.     Right Ear: External ear normal.     Left Ear: External ear normal.     Mouth/Throat:     Pharynx: No oropharyngeal exudate or posterior oropharyngeal erythema.  Eyes:     Pupils: Pupils are equal, round, and reactive to light.  Cardiovascular:     Rate and Rhythm: Normal rate and regular rhythm.     Heart  sounds: No murmur heard. Pulmonary:     Effort: No respiratory distress.     Breath sounds: Normal breath sounds.  Musculoskeletal:     Cervical back: Normal range of motion and neck supple.  Neurological:     Mental Status: He is alert and oriented to person, place, and time.       Assessment & Plan:   Marvin Frost was seen today for medical management of chronic issues.  Diagnoses and all orders for this visit:  Essential hypertension, benign -     CBC with Differential/Platelet -     CMP14+EGFR  Mixed hyperlipidemia -     Lipid panel  Gastroesophageal reflux disease without esophagitis  Other orders -     olmesartan-hydrochlorothiazide (BENICAR HCT) 40-25 MG tablet; Take 1 tablet by mouth daily.   Allergies as of 05/28/2023       Reactions  Brilinta [ticagrelor]    Rash and SOB    Liraglutide Other (See Comments)   Abdominal pain and enlarged pancreas on CT ? pancreatitis   Baxinets [cetylpyridinium-benzocaine] Other (See Comments)   Unknown reaction   Celebrex [celecoxib] Nausea Only   Toprol Xl [metoprolol Succinate] Other (See Comments)   Pt. Has not had any issues with this for many years   Triplex Ad Other (See Comments)   Abdominal pain and enlarged pancreas on CT? pancreatitis   Welchol [colesevelam Hcl] Other (See Comments)   constipation   Norvasc [amlodipine Besylate] Rash   Statins Other (See Comments)   muscle and joint pain if taken for long periods of time, takes Crestor every 2-3 days to avoid this reaction        Medication List        Accurate as of May 28, 2023  8:44 AM. If you have any questions, ask your nurse or doctor.          aspirin EC 81 MG tablet Take 1 tablet (81 mg total) by mouth daily.   cetirizine 10 MG tablet Commonly known as: ZYRTEC Take 10 mg by mouth as needed for allergies or rhinitis.   ferrous sulfate 325 (65 FE) MG tablet Take 1 tablet (325 mg total) by mouth every other day. What changed: when to take  this   fluocinolone 0.01 % external solution Commonly known as: Synalar Apply topically 2 (two) times daily. To scalp   fluticasone 50 MCG/ACT nasal spray Commonly known as: FLONASE Place 2 sprays into both nostrils daily.   freestyle lancets Check BS BID and PRN. DX.E11.9   FreeStyle Libre 3 Plus Sensor Misc Change sensor every 15 days.   insulin glargine 100 UNIT/ML injection Commonly known as: Lantus Inject 0.75 mLs (75 Units total) into the skin at bedtime.   Insulin Pen Needle 32G X 5 MM Misc by Does not apply route. Use with insulin pen needles up to 5 times daily  E11.9   Insulin Pen Needle 32G X 5 MM Misc Commonly known as: NovoTwist USE WITH INSULIN PENS UP TO 5 TIMES DAILY Dx E11.59   metFORMIN 500 MG 24 hr tablet Commonly known as: GLUCOPHAGE-XR Take 2 tablets (1,000 mg total) by mouth daily with breakfast.   metoprolol succinate 100 MG 24 hr tablet Commonly known as: TOPROL-XL TAKE 1 TABLET DAILY FOR BLOOD PRESSURE CONTROL   nitroGLYCERIN 0.4 MG SL tablet Commonly known as: NITROSTAT DISSOLVE 1 TABLET UNDER THE TONGUE EVERY 5 MINUTES AS NEEDED FOR CHEST PAIN   olmesartan-hydrochlorothiazide 40-25 MG tablet Commonly known as: BENICAR HCT Take 1 tablet by mouth daily.   pantoprazole 40 MG tablet Commonly known as: PROTONIX Take 1 tablet (40 mg total) by mouth daily.   Repatha SureClick 140 MG/ML Soaj Generic drug: Evolocumab INJECT 140 MG UNDER THE SKIN EVERY 14 DAYS   rosuvastatin 40 MG tablet Commonly known as: CRESTOR Take 1 tablet (40 mg total) by mouth every 3 (three) days.   Semaglutide (1 MG/DOSE) 4 MG/3ML Sopn Inject 1 mg as directed once a week.        Meds ordered this encounter  Medications   olmesartan-hydrochlorothiazide (BENICAR HCT) 40-25 MG tablet    Sig: Take 1 tablet by mouth daily.    Dispense:  90 tablet    Refill:  3    Continue current treatments  Follow-up: Return in about 6 months (around 11/26/2023).  Mechele Claude, M.D.

## 2023-05-29 LAB — CBC WITH DIFFERENTIAL/PLATELET
Basophils Absolute: 0.1 10*3/uL (ref 0.0–0.2)
Basos: 2 %
EOS (ABSOLUTE): 0.7 10*3/uL — ABNORMAL HIGH (ref 0.0–0.4)
Eos: 9 %
Hematocrit: 47.7 % (ref 37.5–51.0)
Hemoglobin: 15.1 g/dL (ref 13.0–17.7)
Immature Grans (Abs): 0 10*3/uL (ref 0.0–0.1)
Immature Granulocytes: 1 %
Lymphocytes Absolute: 1.4 10*3/uL (ref 0.7–3.1)
Lymphs: 16 %
MCH: 28.7 pg (ref 26.6–33.0)
MCHC: 31.7 g/dL (ref 31.5–35.7)
MCV: 91 fL (ref 79–97)
Monocytes Absolute: 0.7 10*3/uL (ref 0.1–0.9)
Monocytes: 8 %
Neutrophils Absolute: 5.7 10*3/uL (ref 1.4–7.0)
Neutrophils: 64 %
Platelets: 279 10*3/uL (ref 150–450)
RBC: 5.27 x10E6/uL (ref 4.14–5.80)
RDW: 13.2 % (ref 11.6–15.4)
WBC: 8.7 10*3/uL (ref 3.4–10.8)

## 2023-05-29 LAB — CMP14+EGFR
ALT: 17 IU/L (ref 0–44)
AST: 19 IU/L (ref 0–40)
Albumin: 4.1 g/dL (ref 3.8–4.8)
Alkaline Phosphatase: 75 IU/L (ref 44–121)
BUN/Creatinine Ratio: 10 (ref 10–24)
BUN: 10 mg/dL (ref 8–27)
Bilirubin Total: 0.3 mg/dL (ref 0.0–1.2)
CO2: 23 mmol/L (ref 20–29)
Calcium: 9.7 mg/dL (ref 8.6–10.2)
Chloride: 97 mmol/L (ref 96–106)
Creatinine, Ser: 1.05 mg/dL (ref 0.76–1.27)
Globulin, Total: 2.5 g/dL (ref 1.5–4.5)
Glucose: 168 mg/dL — ABNORMAL HIGH (ref 70–99)
Potassium: 4.4 mmol/L (ref 3.5–5.2)
Sodium: 137 mmol/L (ref 134–144)
Total Protein: 6.6 g/dL (ref 6.0–8.5)
eGFR: 72 mL/min/{1.73_m2} (ref >59)

## 2023-05-29 LAB — LIPID PANEL
Cholesterol, Total: 107 mg/dL (ref 100–199)
HDL: 44 mg/dL (ref >39)
LDL CALC COMMENT:: 2.4 ratio (ref 0.0–5.0)
LDL Chol Calc (NIH): 35 mg/dL (ref 0–99)
Triglycerides: 172 mg/dL — ABNORMAL HIGH (ref 0–149)
VLDL Cholesterol Cal: 28 mg/dL (ref 5–40)

## 2023-05-29 NOTE — Progress Notes (Signed)
Hello Affan,  Your lab result is normal and/or stable.Some minor variations that are not significant are commonly marked abnormal, but do not represent any medical problem for you.  Best regards, Dontrae Morini, M.D.

## 2023-06-28 ENCOUNTER — Ambulatory Visit: Payer: Medicare Other | Admitting: Nurse Practitioner

## 2023-07-03 ENCOUNTER — Encounter: Payer: Self-pay | Admitting: Nurse Practitioner

## 2023-07-03 ENCOUNTER — Ambulatory Visit (INDEPENDENT_AMBULATORY_CARE_PROVIDER_SITE_OTHER): Payer: Medicare Other | Admitting: Nurse Practitioner

## 2023-07-03 VITALS — BP 123/73 | HR 78 | Ht 68.0 in | Wt 197.0 lb

## 2023-07-03 DIAGNOSIS — Z7985 Long-term (current) use of injectable non-insulin antidiabetic drugs: Secondary | ICD-10-CM

## 2023-07-03 DIAGNOSIS — E1159 Type 2 diabetes mellitus with other circulatory complications: Secondary | ICD-10-CM

## 2023-07-03 DIAGNOSIS — I10 Essential (primary) hypertension: Secondary | ICD-10-CM | POA: Diagnosis not present

## 2023-07-03 DIAGNOSIS — E782 Mixed hyperlipidemia: Secondary | ICD-10-CM | POA: Diagnosis not present

## 2023-07-03 DIAGNOSIS — Z794 Long term (current) use of insulin: Secondary | ICD-10-CM

## 2023-07-03 DIAGNOSIS — Z7984 Long term (current) use of oral hypoglycemic drugs: Secondary | ICD-10-CM

## 2023-07-03 LAB — POCT GLYCOSYLATED HEMOGLOBIN (HGB A1C): Hemoglobin A1C: 8.7 % — AB (ref 4.0–5.6)

## 2023-07-03 MED ORDER — INSULIN GLARGINE 100 UNIT/ML ~~LOC~~ SOLN: 80.0000 [IU] | Freq: Every day | SUBCUTANEOUS | 3 refills | Status: DC

## 2023-07-03 MED ORDER — METFORMIN HCL ER 500 MG PO TB24: 1000.0000 mg | ORAL_TABLET | Freq: Every day | ORAL | 3 refills | Status: DC

## 2023-07-03 MED ORDER — SEMAGLUTIDE (2 MG/DOSE) 8 MG/3ML ~~LOC~~ SOPN: 2.0000 mg | PEN_INJECTOR | SUBCUTANEOUS | 1 refills | Status: DC

## 2023-07-03 NOTE — Progress Notes (Signed)
 07/03/2023, 3:44 PM     Endocrinology follow-up note   Subjective:    Patient ID: Marvin Frost, male    DOB: December 30, 1945.   Marvin Frost is being seen in follow-up after he was seen in consultation for management of currently uncontrolled symptomatic diabetes.  -He also has dyslipidemia, hypertension. PMD:   Zollie Lowers, MD.   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    Cataract    both removed   Cervical radiculopathy 09/14/2020   Diabetes mellitus 1995   Dysrhythmia    history of heart palpitations    Gastric polyps 09/18/2017   GERD (gastroesophageal reflux disease)    History of frequent urinary tract infections    History of hiatal hernia    Hyperlipemia    Hypertension    Obesity    Pancreatitis     Past Surgical History:  Procedure Laterality Date   CARDIAC CATHETERIZATION     2004 and 1999- done by Dr Lavona    COLONOSCOPY  11/2003   diverticulosis, no polyps   CORONARY PRESSURE/FFR STUDY N/A 05/28/2018   Procedure: INTRAVASCULAR PRESSURE WIRE/FFR STUDY;  Surgeon: Wonda Sharper, MD;  Location: [facility] INVASIVE CV LAB;  Service: Cardiovascular;  Laterality: N/A;   CORONARY STENT INTERVENTION N/A 05/28/2018   Procedure: CORONARY STENT INTERVENTION;  Surgeon: Wonda Sharper, MD;  Location: [facility] INVASIVE CV LAB;  Service: Cardiovascular;  Laterality: N/A;   EYE SURGERY Bilateral    cateracts - dr octavia   HYDROCELE EXCISION / REPAIR     JOINT REPLACEMENT  2017   both hips   left calf surgery      related to trauma of being torn    LEFT HEART CATH AND CORONARY ANGIOGRAPHY N/A 05/28/2018   Procedure: LEFT HEART CATH AND CORONARY ANGIOGRAPHY;  Surgeon: Wonda Sharper, MD;  Location: [facility] INVASIVE CV LAB;  Service: Cardiovascular;   Laterality: N/A;   left index finger arthrioscopic surgery      lymph node removed from right neck      right shoulder surgery     TOTAL HIP ARTHROPLASTY Right 03/16/2015   Procedure: RIGHT TOTAL HIP ARTHROPLASTY ANTERIOR APPROACH;  Surgeon: Donnice Car, MD;  Location: [facility] ORS;  Service: Orthopedics;  Laterality: Right;   TOTAL HIP ARTHROPLASTY Left 06/22/2015   Procedure: LEFT TOTAL HIP ARTHROPLASTY ANTERIOR APPROACH;  Surgeon: Donnice Car, MD;  Location: [facility];  Service: Orthopedics;  Laterality: Left;   UPPER GASTROINTESTINAL ENDOSCOPY     hiatal hernia 2cm, mild gastritis - 2012, multiple hyperplastic and fundic gland gastric polyps 2019    Social History   Socioeconomic History   Marital status: Married    Spouse name: Alisa   Number of children: 1   Years of education: Some College   Highest education level: Associate degree: occupational, scientist, product/process development, or vocational program  Occupational History   Occupation: Event Organiser:  [employer]    Comment: retired  Tobacco Use   Smoking status: Former    Current packs/day: 0.00    Average packs/day: 1 pack/day for 10.0 years (10.0 ttl pk-yrs)    Types: Cigarettes    Start date: 04/06/1974    Quit date: 04/06/1984    Years since quitting: 39.2   Smokeless tobacco: Former    Types: Chew    Quit date: 06/26/1978   Tobacco comments:    I do not smoke  Vaping Use   Vaping status: Never Used  Substance and Sexual Activity   Alcohol use: No   Drug use: No   Sexual activity: Yes    Birth control/protection: None  Other Topics Concern   Not on file  Social History Narrative   Married   Retired from [employer]   1 child   Former but no current tobacco   No EtOH/drugs   Social Drivers of Corporate Investment Banker Strain: Low Risk  (05/28/2023)   Overall Financial Resource Strain (CARDIA)    Difficulty of Paying Living Expenses: Not very hard  Food Insecurity: No Food Insecurity (05/28/2023)   Hunger Vital  Sign    Worried About Running Out of Food in the Last Year: Never true    Ran Out of Food in the Last Year: Never true  Transportation Needs: No Transportation Needs (05/28/2023)   PRAPARE - Administrator, Civil Service (Medical): No    Lack of Transportation (Non-Medical): No  Physical Activity: Sufficiently Active (05/28/2023)   Exercise Vital Sign    Days of Exercise per Week: 3 days    Minutes of Exercise per Session: 60 min  Stress: No Stress Concern Present (05/28/2023)   [employer] of Occupational Health - Occupational Stress Questionnaire    Feeling of Stress : Not at all  Social Connections: Socially Integrated (05/28/2023)   Social Connection and Isolation Panel [NHANES]    Frequency of Communication with Friends and Family: Three times a week    Frequency of Social Gatherings with Friends and Family: Once a week    Attends Religious Services: More than 4 times per year    Active Member of [employer] or Organizations: No    Attends Engineer, Structural: More than 4 times per year    Marital Status: Married    Family History  Problem Relation Age of Onset   Heart disease Mother    Heart attack Mother    Diabetes Brother    Heart disease Brother    Heart attack Brother 59   Heart attack Father 1   Early death Father    Heart disease Father    GI problems Sister        torn esophagus during endoscopy   Healthy Daughter    Colon polyps Neg Hx    Esophageal cancer Neg Hx    Rectal cancer Neg Hx    Stomach cancer Neg Hx    Colon cancer Neg Hx     Outpatient Encounter Medications as of 07/03/2023  Medication Sig   aspirin  EC 81 MG EC tablet Take 1 tablet (81 mg total) by mouth daily.   cetirizine (ZYRTEC) 10 MG tablet Take 10 mg by mouth as needed for allergies or rhinitis.    Continuous Glucose Sensor (FREESTYLE LIBRE 3 PLUS SENSOR) MISC Change sensor every 15 days.   Evolocumab  (REPATHA  SURECLICK) 140 MG/ML SOAJ INJECT 140 MG UNDER THE SKIN  EVERY 14 DAYS  ferrous sulfate  325 (65 FE) MG tablet Take 1 tablet (325 mg total) by mouth every other day. (Patient taking differently: Take 325 mg by mouth every morning.)   fluocinolone  (SYNALAR ) 0.01 % external solution Apply topically 2 (two) times daily. To scalp   fluticasone  (FLONASE ) 50 MCG/ACT nasal spray Place 2 sprays into both nostrils daily.   Insulin  Pen Needle (NOVOTWIST) 32G X 5 MM MISC USE WITH INSULIN  PENS UP TO 5 TIMES DAILY Dx E11.59   Insulin  Pen Needle 32G X 5 MM MISC by Does not apply route. Use with insulin  pen needles up to 5 times daily  E11.9   Lancets (FREESTYLE) lancets Check BS BID and PRN. DX.E11.9   metoprolol  succinate (TOPROL -XL) 100 MG 24 hr tablet TAKE 1 TABLET DAILY FOR BLOOD PRESSURE CONTROL   nitroGLYCERIN  (NITROSTAT ) 0.4 MG SL tablet DISSOLVE 1 TABLET UNDER THE TONGUE EVERY 5 MINUTES AS NEEDED FOR CHEST PAIN   olmesartan -hydrochlorothiazide  (BENICAR  HCT) 40-25 MG tablet Take 1 tablet by mouth daily.   pantoprazole  (PROTONIX ) 40 MG tablet Take 1 tablet (40 mg total) by mouth daily.   rosuvastatin  (CRESTOR ) 40 MG tablet Take 1 tablet (40 mg total) by mouth every 3 (three) days.   Semaglutide , 2 MG/DOSE, 8 MG/3ML SOPN Inject 2 mg as directed once a week.   [DISCONTINUED] insulin  glargine (LANTUS ) 100 UNIT/ML injection Inject 0.75 mLs (75 Units total) into the skin at bedtime.   [DISCONTINUED] metFORMIN  (GLUCOPHAGE -XR) 500 MG 24 hr tablet Take 2 tablets (1,000 mg total) by mouth daily with breakfast.   [DISCONTINUED] Semaglutide , 1 MG/DOSE, (OZEMPIC , 1 MG/DOSE,) 4 MG/3ML SOPN INJECT 1 MG WEEKLY AS DIRECTED   insulin  glargine (LANTUS ) 100 UNIT/ML injection Inject 0.8 mLs (80 Units total) into the skin at bedtime.   metFORMIN  (GLUCOPHAGE -XR) 500 MG 24 hr tablet Take 2 tablets (1,000 mg total) by mouth daily with breakfast.   No facility-administered encounter medications on file as of 07/03/2023.    ALLERGIES: Allergies  Allergen Reactions   Brilinta   [Ticagrelor ]     Rash and SOB    Liraglutide Other (See Comments)    Abdominal pain and enlarged pancreas on CT ? pancreatitis   Baxinets [Cetylpyridinium-Benzocaine] Other (See Comments)    Unknown reaction   Celebrex [Celecoxib] Nausea Only   Toprol  Xl [Metoprolol  Succinate] Other (See Comments)    Pt. Has not had any issues with this for many years    Triplex Ad Other (See Comments)    Abdominal pain and enlarged pancreas on CT? pancreatitis   Welchol [Colesevelam Hcl] Other (See Comments)    constipation   Norvasc [Amlodipine Besylate] Rash   Statins Other (See Comments)    muscle and joint pain if taken for long periods of time, takes Crestor  every 2-3 days to avoid this reaction    VACCINATION STATUS: Immunization History  Administered Date(s) Administered   Fluad Quad(high Dose 65+) 03/26/2019, 04/19/2020, 05/12/2021, 03/30/2022   Fluad Trivalent(High Dose 65+) 04/04/2023   Influenza Whole 02/24/2010   Influenza, High Dose Seasonal PF 04/15/2015, 04/12/2016, 04/05/2017, 04/16/2018   Influenza,inj,Quad PF,6+ Mos 03/26/2013, 03/30/2014   Moderna Sars-Covid-2 Vaccination 07/31/2019, 08/28/2019, 03/15/2020   Pneumococcal Conjugate-13 06/17/2013   Pneumococcal Polysaccharide-23 02/24/2009   Td 02/24/2006   Tdap 04/27/2011   Zoster Recombinant(Shingrix ) 07/22/2018   Zoster, Live 07/27/2006    Diabetes He presents for his follow-up diabetic visit. He has type 2 diabetes mellitus. Onset time: He was diagnosed at approximate age of 48 years. His disease course has been improving.  There are no hypoglycemic associated symptoms. Pertinent negatives for hypoglycemia include no confusion, headaches, pallor or seizures. Pertinent negatives for diabetes include no chest pain, no fatigue, no polydipsia, no polyphagia, no polyuria, no weakness and no weight loss. There are no hypoglycemic complications. Symptoms are stable. Diabetic complications include heart disease and nephropathy. Risk  factors for coronary artery disease include diabetes mellitus, dyslipidemia, family history, male sex, hypertension, sedentary lifestyle, tobacco exposure and obesity. Current diabetic treatment includes oral agent (monotherapy) and insulin  injections (Trulicity ). He is compliant with treatment most of the time. His weight is fluctuating minimally. He is following a generally healthy diet. When asked about meal planning, he reported none. He has not had a previous visit with a dietitian. He participates in exercise intermittently. His home blood glucose trend is decreasing steadily. His breakfast blood glucose range is generally 110-130 mg/dl. His lunch blood glucose range is generally 140-180 mg/dl. His dinner blood glucose range is generally >200 mg/dl. His bedtime blood glucose range is generally >200 mg/dl. His overall blood glucose range is >200 mg/dl. (He presents today with his CGM and logs showing improving glycemic profile, yet still slightly above target.  His POCT A1c today is 8.7%, improving from last visit of 9.6%.  He has tolerated the Ozempic  well, slight nausea if he overdoes it with his diet.  Analysis of his CGM shows TIR 33%, TAR 67%, TBR 0% with a GMI of 8.2%.  He was not eligible for an upgrade of his CGM as he is a few months shy of using the 14 day libre for 5 years.) An ACE inhibitor/angiotensin II receptor blocker is being taken. He does not see a podiatrist.Eye exam is current.  Hyperlipidemia This is a chronic problem. The current episode started more than 1 year ago. The problem is controlled. Recent lipid tests were reviewed and are normal. Exacerbating diseases include chronic renal disease, diabetes and obesity. Factors aggravating his hyperlipidemia include beta blockers. Pertinent negatives include no chest pain, myalgias or shortness of breath. Current antihyperlipidemic treatment includes statins (Repatha  every 2 weeks. Only takes his statin once every few days dt myalgias). The  current treatment provides moderate improvement of lipids. Compliance problems include medication side effects.  Risk factors for coronary artery disease include male sex, obesity, family history, dyslipidemia, diabetes mellitus, hypertension and a sedentary lifestyle.  Hypertension This is a chronic problem. The current episode started more than 1 year ago. The problem has been gradually improving since onset. The problem is controlled. Pertinent negatives include no chest pain, headaches, neck pain, palpitations or shortness of breath. There are no associated agents to hypertension. Risk factors for coronary artery disease include dyslipidemia, diabetes mellitus, family history, male gender, obesity, sedentary lifestyle and smoking/tobacco exposure. Past treatments include angiotensin blockers, diuretics and beta blockers. The current treatment provides moderate improvement. There are no compliance problems.  Hypertensive end-organ damage includes CAD/MI. Identifiable causes of hypertension include chronic renal disease.    Review of systems  Constitutional: + Minimally fluctuating body weight,  current Body mass index is 29.95 kg/m. , no fatigue, no subjective hyperthermia, no subjective hypothermia Eyes: no blurry vision, no xerophthalmia ENT: no sore throat, no nodules palpated in throat, no dysphagia/odynophagia, no hoarseness Cardiovascular: no chest pain, no shortness of breath, no palpitations, no leg swelling Respiratory: no cough, no shortness of breath Gastrointestinal: no nausea/vomiting/diarrhea Musculoskeletal: no muscle/joint aches Skin: no rashes, no hyperemia Neurological: no tremors, no numbness, no tingling, no dizziness Psychiatric: no depression, no anxiety  Objective:    BP 123/73 (BP Location: Left Arm, Patient Position: Sitting, Cuff Size: Large)   Pulse 78   Ht 5' 8 (1.727 m)   Wt 197 lb (89.4 kg)   BMI 29.95 kg/m   Wt Readings from Last 3 Encounters:   07/03/23 197 lb (89.4 kg)  05/28/23 195 lb 6.4 oz (88.6 kg)  04/25/23 196 lb 3.2 oz (89 kg)    BP Readings from Last 3 Encounters:  07/03/23 123/73  05/28/23 128/79  04/25/23 110/80    Physical Exam- Limited  Constitutional:  Body mass index is 29.95 kg/m. 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, decreased sensation to monofilament tool bilaterally    CMP     Component Value Date/Time   NA 137 05/28/2023 0849   K 4.4 05/28/2023 0849   CL 97 05/28/2023 0849   CO2 23 05/28/2023 0849   GLUCOSE 168 (H) 05/28/2023 0849   GLUCOSE 154 (H) 05/29/2018 0217   BUN 10 05/28/2023 0849   CREATININE 1.05 05/28/2023 0849   CREATININE 0.95 12/31/2012 0857   CALCIUM  9.7 05/28/2023 0849   PROT 6.6 05/28/2023 0849   ALBUMIN 4.1 05/28/2023 0849   AST 19 05/28/2023 0849   ALT 17 05/28/2023 0849   ALKPHOS 75 05/28/2023 0849   BILITOT 0.3 05/28/2023 0849   GFRNONAA 68 07/07/2020 1028   GFRNONAA 81 12/31/2012 0857   GFRAA 78 07/07/2020 1028   GFRAA >89 12/31/2012 0857    Diabetic Labs (most recent): Lab Results  Component Value Date   HGBA1C 8.7 (A) 07/03/2023   HGBA1C 8.8 (A) 12/20/2022   HGBA1C 9.2 (A) 09/13/2022   MICROALBUR 30 10/04/2021   MICROALBUR 10 12/30/2020   MICROALBUR 20 01/26/2015     Lipid Panel ( most recent) Lipid Panel     Component Value Date/Time   CHOL 107 05/28/2023 0849   CHOL 157 12/31/2012 0857   TRIG 172 (H)  05/28/2023 0849   TRIG 271 (H) 02/17/2016 0824   TRIG 184 (H) 12/31/2012 0857   HDL 44 05/28/2023 0849   HDL 44 02/17/2016 0824   HDL 45 12/31/2012 0857   CHOLHDL 2.4 05/28/2023 0849   LDLCALC 35 05/28/2023 0849   LDLCALC 52 01/19/2014 1007   LDLCALC 75 12/31/2012 0857   LDLDIRECT 39 11/02/2016 0806      Lab Results  Component Value Date   TSH 3.350 07/07/2020   TSH 2.720 12/30/2019   TSH 2.290 09/03/2014   FREET4 1.42 07/07/2020     Assessment & Plan:   1) DM type 2 causing vascular disease (HCC)  -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  - Rontrell Moquin has currently uncontrolled symptomatic type 2 DM since  78 years of age.  He presents today with his CGM and logs showing improving glycemic profile, yet still slightly above target.  His POCT A1c today is 8.7%, improving from last visit of 9.6%.  He has tolerated the Ozempic  well, slight nausea if he overdoes it with his diet.  Analysis of his CGM shows TIR 33%, TAR 67%, TBR 0% with a GMI of 8.2%.  He was not eligible for an upgrade of his CGM as he is a few months shy of using the 14 day libre for 5 years.  -I have reviewed his recent labs with him.  - I had a long discussion with him about the progressive nature of diabetes and the pathology behind its complications. -his diabetes is complicated by coronary artery disease, obesity and he remains at a high risk for more acute and chronic complications which include CAD, CVA, CKD, retinopathy, and neuropathy. These are all discussed in detail with him.  The following Lifestyle Medicine recommendations according to American College of Lifestyle Medicine [facility]) were discussed and offered to patient and he agrees to start the journey:  A. Whole Foods, Plant-based plate comprising of fruits and vegetables, plant-based proteins, whole-grain carbohydrates was discussed in detail with the patient.   A list for source of those nutrients were also  provided to the patient.  Patient will use only water  or unsweetened tea for hydration. B.  The need to stay away from risky substances including alcohol, smoking; obtaining 7 to 9 hours of restorative sleep, at least 150 minutes of moderate intensity exercise weekly, the importance of healthy social connections,  and stress reduction techniques were discussed. C.  A full color page of  Calorie density of various food groups per pound showing examples of each food groups was provided to the patient.  - Nutritional counseling repeated at each appointment due to patients tendency to fall back in to old habits.  - The patient admits there is a room for improvement in their diet and drink choices. -  Suggestion is made for the patient to avoid simple carbohydrates from their diet including Cakes, Sweet Desserts / Pastries, Ice Cream, Soda (diet and regular), Sweet Tea, Candies, Chips, Cookies, Sweet Pastries, Store Bought Juices, Alcohol in Excess of 1-2 drinks a day, Artificial Sweeteners, Coffee Creamer, and Sugar-free Products. This will help patient to have stable blood glucose profile and potentially avoid unintended weight gain.   - I encouraged the patient to switch to unprocessed or minimally processed complex starch and increased protein intake (animal or plant source), fruits, and vegetables.   - Patient is advised to stick to a routine mealtimes to eat 3 meals a day and avoid unnecessary snacks (to snack only to correct hypoglycemia).  - I have approached him with the following individualized plan to manage diabetes and patient agrees:   -He is advised to continue his Lantus  80 units SQ nightly, continue Metformin  1000 mg ER daily with breakfast, and will increase his Ozempic  to 2 mg SQ weekly.  He has tried both Victoza and Trulicity  which have both been ineffective, not to mention he has not been able to consistently get his medication due to shortage.   He has seen improvement with the  Ozempic .   -He is encouraged to continue monitoring blood glucose using his CGM at least 4 times per day, before meals and at bedtime and call the clinic if he gets readings less than 70 or greater than 200 for 3 tests in a row.  I will try resending in upgrade at next visit.  - Patient specific target  A1c;  LDL, HDL, Triglycerides,  were discussed in detail.  2) Blood Pressure /Hypertension:   His blood pressure is controlled to target.  He is advised to continue Metoprolol  100 mg po daily and  Benicar  HCT 40-25 mg po daily.    3) Lipids/Hyperlipidemia: His most recent lipid panel from 11/21/22 shows controlled LDL of 16 and elevated triglycerides of 201(stable) . He is statin intolerant with myalgias.  He has tolerated taking his Crestor  every 2-3 days.  He is advised to continue taking his Crestor  40 mg po every 3 days or so and continue his Repatha  140 mg SQ every 2 weeks.  Side effects and precautions discussed with him.    4)  Weight/Diet:  His Body mass index is 29.95 kg/m.--   He has lost some weight over the last few visits and will benefit from continued weight loss.  I discussed with him the fact that loss of 5 - 10% of his  current body weight will have the most impact on his diabetes management.  CDE Consult will be initiated . Exercise, and detailed carbohydrates information provided  -  detailed on discharge instructions.  5) Chronic Care/Health Maintenance: -he is on ACEI/ARB and Statin medications and is encouraged to initiate and continue to follow up with Ophthalmology, Dentist, Podiatrist at least yearly or according to recommendations, and advised to  stay away from smoking. I have recommended yearly flu vaccine and pneumonia vaccine at least every 5 years; moderate intensity exercise for up to 150 minutes weekly; and  sleep for at least 7 hours a day.  - he is advised to maintain close follow up with Zollie Lowers, MD for primary care needs, as well as his other providers for  optimal and coordinated care.     I spent  41  minutes in the care of the patient today including review of labs from CMP, Lipids, Thyroid  Function, Hematology (current and previous including abstractions from other facilities); face-to-face time discussing  his blood glucose readings/logs, discussing hypoglycemia and hyperglycemia episodes and symptoms, medications doses, his options of short and long term treatment based on the latest standards of care / guidelines;  discussion about incorporating lifestyle medicine;  and documenting the encounter. Risk reduction counseling performed per USPSTF guidelines to reduce obesity and cardiovascular risk factors.     Please refer to Patient Instructions for Blood Glucose Monitoring and Insulin /Medications Dosing Guide  in media tab for additional information. Please  also refer to  Patient Self Inventory in the Media  tab for reviewed elements of pertinent patient history.  Lynwood Sharps participated in the discussions, expressed understanding, and voiced agreement with the above plans.  All questions were answered to his satisfaction. he is encouraged to contact clinic should he have any questions or concerns prior to his return visit.    Follow up plan: - Return in about 4 months (around 10/31/2023) for Diabetes F/U with A1c in office, No previsit labs, Bring meter and logs.   Benton Rio, [facility] [address] [place], [place] [address] Phone: [phone] Fax: [phone]  07/03/2023, 3:44 PM

## 2023-08-06 ENCOUNTER — Ambulatory Visit (INDEPENDENT_AMBULATORY_CARE_PROVIDER_SITE_OTHER): Payer: Medicare Other

## 2023-08-06 VITALS — Ht 68.0 in | Wt 197.0 lb

## 2023-08-06 DIAGNOSIS — Z0000 Encounter for general adult medical examination without abnormal findings: Secondary | ICD-10-CM

## 2023-08-06 NOTE — Patient Instructions (Signed)
 Marvin Frost , Thank you for taking time to come for your Medicare Wellness Visit. I appreciate your ongoing commitment to your health goals. Please review the following plan we discussed and let me know if I can assist you in the future.   Referrals/Orders/Follow-Ups/Clinician Recommendations: Aim for 30 minutes of exercise or brisk walking, 6-8 glasses of water , and 5 servings of fruits and vegetables each day.  This is a list of the screening recommended for you and due dates:  Health Maintenance  Topic Date Due   Complete foot exam   07/06/2022   COVID-19 Vaccine (4 - 2024-25 season) 02/25/2023   Zoster (Shingles) Vaccine (2 of 2) 08/26/2023*   DTaP/Tdap/Td vaccine (3 - Td or Tdap) 05/27/2024*   Yearly kidney health urinalysis for diabetes  11/21/2023   Hemoglobin A1C  12/31/2023   Eye exam for diabetics  04/03/2024   Yearly kidney function blood test for diabetes  05/27/2024   Medicare Annual Wellness Visit  08/05/2024   Pneumonia Vaccine  Completed   Flu Shot  Completed   Hepatitis C Screening  Completed   HPV Vaccine  Aged Out  *Topic was postponed. The date shown is not the original due date.    Advanced directives: (In Chart) A copy of your advanced directives are scanned into your chart should your provider ever need it.  Next Medicare Annual Wellness Visit scheduled for next year: Yes

## 2023-08-06 NOTE — Progress Notes (Signed)
 Subjective:   Marvin Frost is a 78 y.o. male who presents for Medicare Annual/Subsequent preventive examination.  Visit Complete: Virtual I connected with  Marvin Frost on 08/06/23 by a audio enabled telemedicine application and verified that I am speaking with the correct person using two identifiers.  Patient Location: Home  Provider Location: Home Office  This patient declined Interactive audio and video telecommunications. Therefore the visit was completed with audio only.  I discussed the limitations of evaluation and management by telemedicine. The patient expressed understanding and agreed to proceed.  Vital Signs: Because this visit was a virtual/telehealth visit, some criteria may be missing or patient reported. Any vitals not documented were not able to be obtained and vitals that have been documented are patient reported.  Cardiac Risk Factors include: advanced age (>39men, >58 women);diabetes mellitus;dyslipidemia;male gender;hypertension     Objective:    Today's Vitals   08/06/23 1604  Weight: 197 lb (89.4 kg)  Height: 5\' 8"  (1.727 m)   Body mass index is 29.95 kg/m.     08/06/2023    4:08 PM 07/31/2022    5:19 PM 07/28/2021    4:16 PM 07/26/2020   10:08 AM 07/24/2019    9:41 AM 07/22/2018   10:54 AM 05/27/2018    9:40 PM  Advanced Directives  Does Patient Have a Medical Advance Directive? Yes Yes Yes Yes Yes Yes No  Type of Estate agent of [place];Living will Living will Healthcare Power of [place];Living will Healthcare Power of [place];Living will Living will;Healthcare Power of [employer] Power of [place];Living will   Does patient want to make changes to medical advance directive? No - Patient declined No - Patient declined  No - Patient declined No - Patient declined No - Patient declined   Copy of Healthcare Power of Attorney in Chart? Yes - validated most recent copy scanned in chart (See row information)  Yes - validated most  recent copy scanned in chart (See row information) No - copy requested No - copy requested No - copy requested   Would patient like information on creating a medical advance directive?       No - Patient declined    Current Medications (verified) Outpatient Encounter Medications as of 08/06/2023  Medication Sig   aspirin  EC 81 MG EC tablet Take 1 tablet (81 mg total) by mouth daily.   cetirizine (ZYRTEC) 10 MG tablet Take 10 mg by mouth as needed for allergies or rhinitis.    Continuous Glucose Sensor (FREESTYLE LIBRE 3 PLUS SENSOR) MISC Change sensor every 15 days.   Evolocumab  (REPATHA  SURECLICK) 140 MG/ML SOAJ INJECT 140 MG UNDER THE SKIN EVERY 14 DAYS   ferrous sulfate  325 (65 FE) MG tablet Take 1 tablet (325 mg total) by mouth every other day. (Patient taking differently: Take 325 mg by mouth every morning.)   fluocinolone  (SYNALAR ) 0.01 % external solution Apply topically 2 (two) times daily. To scalp   fluticasone  (FLONASE ) 50 MCG/ACT nasal spray Place 2 sprays into both nostrils daily.   insulin  glargine (LANTUS ) 100 UNIT/ML injection Inject 0.8 mLs (80 Units total) into the skin at bedtime.   Insulin  Pen Needle (NOVOTWIST) 32G X 5 MM MISC USE WITH INSULIN  PENS UP TO 5 TIMES DAILY Dx E11.59   Insulin  Pen Needle 32G X 5 MM MISC by Does not apply route. Use with insulin  pen needles up to 5 times daily  E11.9   Lancets (FREESTYLE) lancets Check BS BID and PRN. DX.E11.9  metFORMIN  (GLUCOPHAGE -XR) 500 MG 24 hr tablet Take 2 tablets (1,000 mg total) by mouth daily with breakfast.   metoprolol  succinate (TOPROL -XL) 100 MG 24 hr tablet TAKE 1 TABLET DAILY FOR BLOOD PRESSURE CONTROL   nitroGLYCERIN  (NITROSTAT ) 0.4 MG SL tablet DISSOLVE 1 TABLET UNDER THE TONGUE EVERY 5 MINUTES AS NEEDED FOR CHEST PAIN   olmesartan -hydrochlorothiazide  (BENICAR  HCT) 40-25 MG tablet Take 1 tablet by mouth daily.   pantoprazole  (PROTONIX ) 40 MG tablet Take 1 tablet (40 mg total) by mouth daily.   rosuvastatin   (CRESTOR ) 40 MG tablet Take 1 tablet (40 mg total) by mouth every 3 (three) days.   Semaglutide , 2 MG/DOSE, 8 MG/3ML SOPN Inject 2 mg as directed once a week.   No facility-administered encounter medications on file as of 08/06/2023.    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    Cataract    both removed   Cervical radiculopathy 09/14/2020   Diabetes mellitus 1995   Dysrhythmia    history of heart palpitations    Gastric polyps 09/18/2017   GERD (gastroesophageal reflux disease)    History of frequent urinary tract infections    History of hiatal hernia    Hyperlipemia    Hypertension    Obesity    Pancreatitis    Past Surgical History:  Procedure Laterality Date   CARDIAC CATHETERIZATION     2004 and 1999- done by Dr Lavonne Prairie    COLONOSCOPY  11/2003   diverticulosis, no polyps   CORONARY PRESSURE/FFR STUDY N/A 05/28/2018   Procedure: INTRAVASCULAR PRESSURE WIRE/FFR STUDY;  Surgeon: Arnoldo Lapping, MD;  Location: [facility] INVASIVE CV LAB;  Service: Cardiovascular;  Laterality: N/A;   CORONARY STENT INTERVENTION N/A 05/28/2018   Procedure: CORONARY STENT INTERVENTION;  Surgeon: Arnoldo Lapping, MD;  Location: [facility] INVASIVE CV LAB;  Service: Cardiovascular;  Laterality: N/A;   EYE SURGERY Bilateral    cateracts - dr Candi Chafe   HYDROCELE EXCISION / REPAIR     JOINT REPLACEMENT  2017   both hips   left calf surgery      related to trauma of being torn    LEFT HEART CATH AND CORONARY ANGIOGRAPHY N/A 05/28/2018   Procedure: LEFT HEART CATH AND CORONARY ANGIOGRAPHY;  Surgeon: Arnoldo Lapping, MD;   Location: [facility] INVASIVE CV LAB;  Service: Cardiovascular;  Laterality: N/A;   left index finger arthrioscopic surgery      lymph node removed from right neck      right shoulder surgery     TOTAL HIP ARTHROPLASTY Right 03/16/2015   Procedure: RIGHT TOTAL HIP ARTHROPLASTY ANTERIOR APPROACH;  Surgeon: Claiborne Crew, MD;  Location: [facility] ORS;  Service: Orthopedics;  Laterality: Right;   TOTAL HIP ARTHROPLASTY Left 06/22/2015   Procedure: LEFT TOTAL HIP ARTHROPLASTY ANTERIOR APPROACH;  Surgeon: Claiborne Crew, MD;  Location: [facility];  Service: Orthopedics;  Laterality: Left;   UPPER GASTROINTESTINAL ENDOSCOPY     hiatal hernia 2cm, mild gastritis - 2012, multiple hyperplastic and fundic gland gastric polyps 2019   Family History  Problem Relation Age of Onset   Heart disease Mother    Heart attack Mother  Diabetes Brother    Heart disease Brother    Heart attack Brother 3   Heart attack Father 10   Early death Father    Heart disease Father    GI problems Sister        torn esophagus during endoscopy   Healthy Daughter    Colon polyps Neg Hx    Esophageal cancer Neg Hx    Rectal cancer Neg Hx    Stomach cancer Neg Hx    Colon cancer Neg Hx    Social History   Socioeconomic History   Marital status: Married    Spouse name: Marvin Frost   Number of children: 1   Years of education: Some College   Highest education level: Associate degree: occupational, Scientist, product/process development, or vocational program  Occupational History   Occupation: Event organiser: [employer]    Comment: retired  Tobacco Use   Smoking status: Former    Current packs/day: 0.00    Average packs/day: 1 pack/day for 10.0 years (10.0 ttl pk-yrs)    Types: Cigarettes    Start date: 04/06/1974    Quit date: 04/06/1984    Years since quitting: 39.3   Smokeless tobacco: Former    Types: Chew    Quit date: 06/26/1978   Tobacco comments:    I do not smoke  Vaping Use   Vaping status: Never Used  Substance and Sexual Activity    Alcohol use: No   Drug use: No   Sexual activity: Yes    Birth control/protection: None  Other Topics Concern   Not on file  Social History Narrative   Married   Retired from [employer]   1 child   Former but no current tobacco   No EtOH/drugs   Social Drivers of Corporate investment banker Strain: Low Risk  (08/06/2023)   Overall Financial Resource Strain (CARDIA)    Difficulty of Paying Living Expenses: Not hard at all  Food Insecurity: No Food Insecurity (08/06/2023)   Hunger Vital Sign    Worried About Running Out of Food in the Last Year: Never true    Ran Out of Food in the Last Year: Never true  Transportation Needs: No Transportation Needs (08/06/2023)   PRAPARE - Administrator, Civil Service (Medical): No    Lack of Transportation (Non-Medical): No  Physical Activity: Sufficiently Active (08/06/2023)   Exercise Vital Sign    Days of Exercise per Week: 3 days    Minutes of Exercise per Session: 60 min  Stress: No Stress Concern Present (08/06/2023)   [employer] of Occupational Health - Occupational Stress Questionnaire    Feeling of Stress : Not at all  Social Connections: Socially Integrated (08/06/2023)   Social Connection and Isolation Panel [NHANES]    Frequency of Communication with Friends and Family: More than three times a week    Frequency of Social Gatherings with Friends and Family: Three times a week    Attends Religious Services: More than 4 times per year    Active Member of Clubs or Organizations: No    Attends Engineer, structural: More than 4 times per year    Marital Status: Married    Tobacco Counseling Counseling given: Not Answered Tobacco comments: I do not smoke   Clinical Intake:  Pre-visit preparation completed: Yes  Pain : No/denies pain     Diabetes: Yes CBG done?: No Did pt. bring in CBG monitor from home?: No  How often  do you need to have someone help you when you read instructions,  pamphlets, or other written materials from your doctor or pharmacy?: 1 - Never  Interpreter Needed?: No  Information entered by :: Seabron Cypress LPN   Activities of Daily Living    08/06/2023    4:05 PM  In your present state of health, do you have any difficulty performing the following activities:  Hearing? 0  Vision? 0  Difficulty concentrating or making decisions? 0  Walking or climbing stairs? 0  Dressing or bathing? 0  Doing errands, shopping? 0  Preparing Food and eating ? N  Using the Toilet? N  In the past six months, have you accidently leaked urine? N  Do you have problems with loss of bowel control? N  Managing your Medications? N  Managing your Finances? N  Housekeeping or managing your Housekeeping? N    Patient Care Team: Roise Cleaver, MD as PCP - General (Family Medicine) Eilleen Grates, MD as PCP - Cardiology (Cardiology) Claiborne Crew, MD as Consulting Physician (Orthopedic Surgery) Eilleen Grates, MD as Consulting Physician (Cardiology) Baby Bolt, MD as Consulting Physician (Endocrinology) [facility]  Indicate any recent Medical Services you may have received from other than [facility] providers in the past year (date may be approximate).     Assessment:   This is a routine wellness examination for [place].  Hearing/Vision screen Hearing Screening - Comments:: Denies hearing difficulties   Vision Screening - Comments:: Wears rx glasses - up to date with routine eye exams with [facility]    Goals Addressed             This Visit's Progress    COMPLETED: AWV       07/28/2021 AWV Goal: Diabetes Management  Patient will maintain an A1C level below 8.0 Patient will not develop any diabetic foot complications Patient will not experience any hypoglycemic episodes over the next 3 months Patient will notify our office of any CBG readings outside of the provider recommended range by calling [phone] Patient will  adhere to provider recommendations for diabetes management  Patient Self Management Activities take all medications as prescribed and report any negative side effects monitor and record blood sugar readings as directed adhere to a low carbohydrate diet that incorporates Frost proteins, vegetables, whole grains, low glycemic fruits check feet daily noting any sores, cracks, injuries, or callous formations see PCP or podiatrist if he notices any changes in his legs, feet, or toenails Patient will visit PCP and have an A1C level checked every 3 to 6 months as directed  have a yearly eye exam to monitor for vascular changes associated with diabetes and will request that the report be sent to his pcp.  consult with his PCP regarding any changes in his health or new or worsening symptoms      Remain active and independent        Depression Screen    08/06/2023    4:07 PM 05/28/2023    8:12 AM 03/01/2023    1:53 PM 11/21/2022    8:11 AM 06/07/2022    9:29 AM 05/22/2022    7:57 AM 11/16/2021    8:14 AM  PHQ 2/9 Scores  PHQ - 2 Score 0 0 0 0 0 0 0  PHQ- 9 Score   0        Fall Risk    08/06/2023    4:09 PM 05/28/2023    8:12 AM 03/01/2023  1:53 PM 11/21/2022    8:11 AM 07/31/2022    5:14 PM  Fall Risk   Falls in the past year? 0 0 0 0 0  Number falls in past yr: 0    0  Injury with Fall? 0    0  Risk for fall due to : No Fall Risks      Follow up Falls prevention discussed;Education provided;Falls evaluation completed    Falls prevention discussed;Education provided;Falls evaluation completed    MEDICARE RISK AT HOME: Medicare Risk at Home Any stairs in or around the home?: No If so, are there any without handrails?: No Home free of loose throw rugs in walkways, pet beds, electrical cords, etc?: Yes Adequate lighting in your home to reduce risk of falls?: Yes Life alert?: No Use of a cane, walker or w/c?: No Grab bars in the bathroom?: Yes Shower chair or bench in shower?:  No Elevated toilet seat or a handicapped toilet?: Yes  TIMED UP AND GO:  Was the test performed?  No    Cognitive Function:    07/23/2018   10:43 AM 05/08/2017    8:56 AM 11/12/2015    8:59 AM 11/05/2014    8:34 AM  MMSE - Mini Mental State Exam  Orientation to time 5 5 5 5   Orientation to Place 5 5 5 5   Registration 3 3 3 3   Attention/ Calculation 5 5 5 5   Recall 3 3 3 3   Language- name 2 objects 2 2 2 2   Language- repeat 1 1 1 1   Language- follow 3 step command 3 3 3 3   Language- read & follow direction 1 1 1 1   Write a sentence 1 1 1 1   Copy design 1 1 1 1   Total score 30 30 30 30         08/06/2023    4:09 PM 07/31/2022    5:19 PM 07/26/2020   10:10 AM 07/24/2019    9:42 AM  6CIT Screen  What Year? 0 points 0 points 0 points 0 points  What month? 0 points 0 points 0 points 0 points  What time? 0 points 0 points 0 points 0 points  Count back from 20 0 points 0 points 0 points 0 points  Months in reverse 0 points 0 points 0 points 0 points  Repeat phrase 0 points 0 points 0 points 0 points  Total Score 0 points 0 points 0 points 0 points    Immunizations Immunization History  Administered Date(s) Administered   Fluad Quad(high Dose 65+) 03/26/2019, 04/19/2020, 05/12/2021, 03/30/2022   Fluad Trivalent(High Dose 65+) 04/04/2023   Influenza Whole 02/24/2010   Influenza, High Dose Seasonal PF 04/15/2015, 04/12/2016, 04/05/2017, 04/16/2018   Influenza,inj,Quad PF,6+ Mos 03/26/2013, 03/30/2014   Moderna Sars-Covid-2 Vaccination 07/31/2019, 08/28/2019, 03/15/2020   Pneumococcal Conjugate-13 06/17/2013   Pneumococcal Polysaccharide-23 02/24/2009   Td 02/24/2006   Tdap 04/27/2011   Zoster Recombinant(Shingrix ) 07/22/2018   Zoster, Live 07/27/2006    TDAP status: Due, Education has been provided regarding the importance of this vaccine. Advised may receive this vaccine at local pharmacy or Health Dept. Aware to provide a copy of the vaccination record if obtained from  local pharmacy or Health Dept. Verbalized acceptance and understanding.  Flu Vaccine status: Up to date  Pneumococcal vaccine status: Up to date  Covid-19 vaccine status: Information provided on how to obtain vaccines.   Qualifies for Shingles Vaccine? Yes   Zostavax completed Yes   Shingrix  Completed?: No.  Education has been provided regarding the importance of this vaccine. Patient has been advised to call insurance company to determine out of pocket expense if they have not yet received this vaccine. Advised may also receive vaccine at local pharmacy or Health Dept. Verbalized acceptance and understanding.  Screening Tests Health Maintenance  Topic Date Due   FOOT EXAM  07/06/2022   COVID-19 Vaccine (4 - 2024-25 season) 02/25/2023   Zoster Vaccines- Shingrix  (2 of 2) 08/26/2023 (Originally 09/16/2018)   DTaP/Tdap/Td (3 - Td or Tdap) 05/27/2024 (Originally 04/26/2021)   Diabetic kidney evaluation - Urine ACR  11/21/2023   HEMOGLOBIN A1C  12/31/2023   OPHTHALMOLOGY EXAM  04/03/2024   Diabetic kidney evaluation - eGFR measurement  05/27/2024   Medicare Annual Wellness (AWV)  08/05/2024   Pneumonia Vaccine 61+ Years old  Completed   INFLUENZA VACCINE  Completed   Hepatitis C Screening  Completed   HPV VACCINES  Aged Out    Health Maintenance  Health Maintenance Due  Topic Date Due   FOOT EXAM  07/06/2022   COVID-19 Vaccine (4 - 2024-25 season) 02/25/2023    Colorectal cancer screening: No longer required.   Lung Cancer Screening: (Low Dose CT Chest recommended if Age 46-80 years, 20 pack-year currently smoking OR have quit w/in 15years.) does not qualify.   Lung Cancer Screening Referral: n/a  Additional Screening:  Hepatitis C Screening: does qualify; Completed 11/12/15  Vision Screening: Recommended annual ophthalmology exams for early detection of glaucoma and other disorders of the eye. Is the patient up to date with their annual eye exam?  Yes  Who is the  provider or what is the name of the office in which the patient attends annual eye exams? [facility] If pt is not established with a provider, would they like to be referred to a provider to establish care? No .   Dental Screening: Recommended annual dental exams for proper oral hygiene  Diabetic Foot Exam: Diabetic Foot Exam: Overdue, Pt has been advised about the importance in completing this exam. Pt is scheduled for diabetic foot exam on at next office visit.  Community Resource Referral / Chronic Care Management: CRR required this visit?  No   CCM required this visit?  No     Plan:     I have personally reviewed and noted the following in the patient's chart:   Medical and social history Use of alcohol, tobacco or illicit drugs  Current medications and supplements including opioid prescriptions. Patient is not currently taking opioid prescriptions. Functional ability and status Nutritional status Physical activity Advanced directives List of other physicians Hospitalizations, surgeries, and ER visits in previous 12 months Vitals Screenings to include cognitive, depression, and falls Referrals and appointments  In addition, I have reviewed and discussed with patient certain preventive protocols, quality metrics, and best practice recommendations. A written personalized care plan for preventive services as well as general preventive health recommendations were provided to patient.     Seabron Cypress [place], [place]   7/56/4332   After Visit Summary: (MyChart) Due to this being a telephonic visit, the after visit summary with patients personalized plan was offered to patient via MyChart   Nurse Notes: No concerns at this time

## 2023-09-10 ENCOUNTER — Telehealth: Payer: Self-pay | Admitting: *Deleted

## 2023-09-10 NOTE — Telephone Encounter (Signed)
 Patient left a voicemail. One week ago from today,09/10/2023, patient started having heavy diarrhea. He took Imodium, it slacked up , this was on last Thursday. Within 36 hours it started up again. On yesterday he says that he has nausea and vomiting and to day he has had a little, along with the diarrhea. The Ozempic being injected is 2 mg weekly. He ask if it may be the Ozempic? He was on Trulicity at one time and had no problems other that it got to where he could not get any longer. Is this something that he could retry?

## 2023-09-11 NOTE — Telephone Encounter (Signed)
 It could be the Ozempic but he was doing well with it previously (only did he have some nausea on the days he over did it with his diet).  I know that there has been a stomach bug going around too.  I recommend stopping his Ozempic for 2 weeks then restarting at the 1 mg dose (will have to listen for the clicks- which would be 37 clicks on the 2 mg pen).

## 2023-09-12 NOTE — Telephone Encounter (Signed)
 Patient was called and a voice mail was left with Whitney's instruction.Patient was advised if he had any questions to call our office.

## 2023-10-04 ENCOUNTER — Encounter: Payer: Self-pay | Admitting: Nurse Practitioner

## 2023-11-01 ENCOUNTER — Encounter: Payer: Self-pay | Admitting: Nurse Practitioner

## 2023-11-01 ENCOUNTER — Ambulatory Visit: Payer: Medicare Other | Admitting: Nurse Practitioner

## 2023-11-01 VITALS — BP 126/76 | HR 72 | Ht 68.0 in | Wt 195.8 lb

## 2023-11-01 DIAGNOSIS — Z7985 Long-term (current) use of injectable non-insulin antidiabetic drugs: Secondary | ICD-10-CM

## 2023-11-01 DIAGNOSIS — Z794 Long term (current) use of insulin: Secondary | ICD-10-CM

## 2023-11-01 DIAGNOSIS — E1159 Type 2 diabetes mellitus with other circulatory complications: Secondary | ICD-10-CM

## 2023-11-01 DIAGNOSIS — Z7984 Long term (current) use of oral hypoglycemic drugs: Secondary | ICD-10-CM | POA: Diagnosis not present

## 2023-11-01 DIAGNOSIS — E782 Mixed hyperlipidemia: Secondary | ICD-10-CM | POA: Diagnosis not present

## 2023-11-01 DIAGNOSIS — I10 Essential (primary) hypertension: Secondary | ICD-10-CM | POA: Diagnosis not present

## 2023-11-01 LAB — POCT GLYCOSYLATED HEMOGLOBIN (HGB A1C): Hemoglobin A1C: 9.3 % — AB (ref 4.0–5.6)

## 2023-11-01 MED ORDER — FREESTYLE LITE TEST VI STRP: ORAL_STRIP | 12 refills | Status: DC

## 2023-11-01 MED ORDER — INSULIN GLARGINE 100 UNIT/ML ~~LOC~~ SOLN: 80.0000 [IU] | Freq: Every day | SUBCUTANEOUS | 3 refills | Status: AC

## 2023-11-01 MED ORDER — INSULIN PEN NEEDLE 32G X 5 MM MISC: 3 refills | Status: AC

## 2023-11-01 MED ORDER — TRULICITY 4.5 MG/0.5ML ~~LOC~~ SOAJ: 4.5000 mg | SUBCUTANEOUS | 3 refills | Status: DC

## 2023-11-01 NOTE — Progress Notes (Signed)
 11/02/2023, 7:00 AM     Endocrinology follow-up note   Subjective:    Patient ID: Marvin Frost, male    DOB: [birth year].   Marvin Frost is being seen in follow-up after he was seen in consultation for management of currently uncontrolled symptomatic diabetes.  -He also has dyslipidemia, hypertension. PMD:   Roise Cleaver, MD.   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    Cataract    both removed   Cervical radiculopathy 09/14/2020   Diabetes mellitus 1995   Dysrhythmia    history of heart palpitations    Gastric polyps 09/18/2017   GERD (gastroesophageal reflux disease)    History of frequent urinary tract infections    History of hiatal hernia    Hyperlipemia    Hypertension    Obesity    Pancreatitis     Past Surgical History:  Procedure Laterality Date   CARDIAC CATHETERIZATION     2004 and 1999- done by Dr Lavonne Prairie    COLONOSCOPY  11/2003   diverticulosis, no polyps   CORONARY PRESSURE/FFR STUDY N/A 05/28/2018   Procedure: INTRAVASCULAR PRESSURE WIRE/FFR STUDY;  Surgeon: Arnoldo Lapping, MD;  Location: [facility] INVASIVE CV LAB;  Service: Cardiovascular;  Laterality: N/A;   CORONARY STENT INTERVENTION N/A 05/28/2018   Procedure: CORONARY STENT INTERVENTION;  Surgeon: Arnoldo Lapping, MD;  Location: [facility] INVASIVE CV LAB;  Service: Cardiovascular;  Laterality: N/A;   EYE SURGERY Bilateral    cateracts - dr Candi Chafe   HYDROCELE EXCISION / REPAIR     JOINT REPLACEMENT  2017   both hips   left calf surgery      related to trauma of being torn    LEFT HEART CATH AND CORONARY ANGIOGRAPHY N/A 05/28/2018   Procedure: LEFT HEART CATH AND CORONARY ANGIOGRAPHY;  Surgeon: Arnoldo Lapping, MD;  Location: [facility] INVASIVE CV LAB;  Service: Cardiovascular;   Laterality: N/A;   left index finger arthrioscopic surgery      lymph node removed from right neck      right shoulder surgery     TOTAL HIP ARTHROPLASTY Right 03/16/2015   Procedure: RIGHT TOTAL HIP ARTHROPLASTY ANTERIOR APPROACH;  Surgeon: Claiborne Crew, MD;  Location: [facility] ORS;  Service: Orthopedics;  Laterality: Right;   TOTAL HIP ARTHROPLASTY Left 06/22/2015   Procedure: LEFT TOTAL HIP ARTHROPLASTY ANTERIOR APPROACH;  Surgeon: Claiborne Crew, MD;  Location: [facility];  Service: Orthopedics;  Laterality: Left;   UPPER GASTROINTESTINAL ENDOSCOPY     hiatal hernia 2cm, mild gastritis - 2012, multiple hyperplastic and fundic gland gastric polyps 2019    Social History   Socioeconomic History   Marital status: Married    Spouse name: Gregary Lean   Number of children: 1   Years of education: Some College   Highest education level: Associate degree: occupational, Scientist, product/process development, or vocational program  Occupational History   Occupation: Event organiser:  [employer]    Comment: retired  Tobacco Use   Smoking status: Former    Current packs/day: 0.00    Average packs/day: 1 pack/day for 10.0 years (10.0 ttl pk-yrs)    Types: Cigarettes    Start date: 04/06/1974    Quit date: 04/06/1984    Years since quitting: 39.6   Smokeless tobacco: Former    Types: Chew    Quit date: 06/26/1978   Tobacco comments:    I do not smoke  Vaping Use   Vaping status: Never Used  Substance and Sexual Activity   Alcohol use: No   Drug use: No   Sexual activity: Yes    Birth control/protection: None  Other Topics Concern   Not on file  Social History Narrative   Married   Retired from [employer]   1 child   Former but no current tobacco   No EtOH/drugs   Social Drivers of Corporate investment banker Strain: Low Risk  (08/06/2023)   Overall Financial Resource Strain (CARDIA)    Difficulty of Paying Living Expenses: Not hard at all  Food Insecurity: No Food Insecurity (08/06/2023)   Hunger Vital  Sign    Worried About Running Out of Food in the Last Year: Never true    Ran Out of Food in the Last Year: Never true  Transportation Needs: No Transportation Needs (08/06/2023)   PRAPARE - Administrator, Civil Service (Medical): No    Lack of Transportation (Non-Medical): No  Physical Activity: Sufficiently Active (08/06/2023)   Exercise Vital Sign    Days of Exercise per Week: 3 days    Minutes of Exercise per Session: 60 min  Stress: No Stress Concern Present (08/06/2023)   [employer] of Occupational Health - Occupational Stress Questionnaire    Feeling of Stress : Not at all  Social Connections: Socially Integrated (08/06/2023)   Social Connection and Isolation Panel [NHANES]    Frequency of Communication with Friends and Family: More than three times a week    Frequency of Social Gatherings with Friends and Family: Three times a week    Attends Religious Services: More than 4 times per year    Active Member of Clubs or Organizations: No    Attends Engineer, structural: More than 4 times per year    Marital Status: Married    Family History  Problem Relation Age of Onset   Heart disease Mother    Heart attack Mother    Diabetes Brother    Heart disease Brother    Heart attack Brother 53   Heart attack Father 76   Early death Father    Heart disease Father    GI problems Sister        torn esophagus during endoscopy   Healthy Daughter    Colon polyps Neg Hx    Esophageal cancer Neg Hx    Rectal cancer Neg Hx    Stomach cancer Neg Hx    Colon cancer Neg Hx     Outpatient Encounter Medications as of 11/01/2023  Medication Sig   aspirin  EC 81 MG EC tablet Take 1 tablet (81 mg total) by mouth daily.   cetirizine (ZYRTEC) 10 MG tablet Take 10 mg by mouth as needed for allergies or rhinitis.    Continuous Glucose Sensor (FREESTYLE LIBRE 3 PLUS SENSOR) MISC Change sensor every 15 days.   Dulaglutide  (TRULICITY ) 4.5 MG/0.5ML SOAJ Inject 4.5 mg as  directed once a week.  Evolocumab  (REPATHA  SURECLICK) 140 MG/ML SOAJ INJECT 140 MG UNDER THE SKIN EVERY 14 DAYS   ferrous sulfate  325 (65 FE) MG tablet Take 1 tablet (325 mg total) by mouth every other day. (Patient taking differently: Take 325 mg by mouth every morning.)   fluocinolone  (SYNALAR ) 0.01 % external solution Apply topically 2 (two) times daily. To scalp   fluticasone  (FLONASE ) 50 MCG/ACT nasal spray Place 2 sprays into both nostrils daily.   glucose blood (FREESTYLE LITE) test strip Use as instructed to monitor glucose 2 times daily   Insulin  Pen Needle (NOVOTWIST) 32G X 5 MM MISC USE WITH INSULIN  PENS UP TO 5 TIMES DAILY Dx E11.59   Lancets (FREESTYLE) lancets Check BS BID and PRN. DX.E11.9   metFORMIN  (GLUCOPHAGE -XR) 500 MG 24 hr tablet Take 2 tablets (1,000 mg total) by mouth daily with breakfast.   metoprolol  succinate (TOPROL -XL) 100 MG 24 hr tablet TAKE 1 TABLET DAILY FOR BLOOD PRESSURE CONTROL   nitroGLYCERIN  (NITROSTAT ) 0.4 MG SL tablet DISSOLVE 1 TABLET UNDER THE TONGUE EVERY 5 MINUTES AS NEEDED FOR CHEST PAIN   olmesartan -hydrochlorothiazide  (BENICAR  HCT) 40-25 MG tablet Take 1 tablet by mouth daily.   pantoprazole  (PROTONIX ) 40 MG tablet Take 1 tablet (40 mg total) by mouth daily.   rosuvastatin  (CRESTOR ) 40 MG tablet Take 1 tablet (40 mg total) by mouth every 3 (three) days.   [DISCONTINUED] Dulaglutide  (TRULICITY ) 1.5 MG/0.5ML SOAJ Inject 1.5 mg into the skin once a week.   [DISCONTINUED] insulin  glargine (LANTUS ) 100 UNIT/ML injection Inject 0.8 mLs (80 Units total) into the skin at bedtime.   [DISCONTINUED] Insulin  Pen Needle 32G X 5 MM MISC by Does not apply route. Use with insulin  pen needles up to 5 times daily  E11.9   insulin  glargine (LANTUS ) 100 UNIT/ML injection Inject 0.8 mLs (80 Units total) into the skin at bedtime.   Insulin  Pen Needle 32G X 5 MM MISC Use to inject insulin  1 time daily   [DISCONTINUED] Semaglutide , 2 MG/DOSE, 8 MG/3ML SOPN Inject 2 mg as  directed once a week. (Patient not taking: Reported on 11/01/2023)   No facility-administered encounter medications on file as of 11/01/2023.    ALLERGIES: Allergies  Allergen Reactions   Brilinta  [Ticagrelor ]     Rash and SOB    Liraglutide Other (See Comments)    Abdominal pain and enlarged pancreas on CT ? pancreatitis   Baxinets [Cetylpyridinium-Benzocaine] Other (See Comments)    Unknown reaction   Celebrex [Celecoxib] Nausea Only   Toprol  Xl [Metoprolol  Succinate] Other (See Comments)    Pt. Has not had any issues with this for many years    Triplex Ad Other (See Comments)    Abdominal pain and enlarged pancreas on CT? pancreatitis   Welchol [Colesevelam Hcl] Other (See Comments)    constipation   Norvasc [Amlodipine Besylate] Rash   Statins Other (See Comments)    muscle and joint pain if taken for long periods of time, takes Crestor  every 2-3 days to avoid this reaction    VACCINATION STATUS: Immunization History  Administered Date(s) Administered   Fluad Quad(high Dose 65+) 03/26/2019, 04/19/2020, 05/12/2021, 03/30/2022   Fluad Trivalent(High Dose 65+) 04/04/2023   Influenza Whole 02/24/2010   Influenza, High Dose Seasonal PF 04/15/2015, 04/12/2016, 04/05/2017, 04/16/2018   Influenza,inj,Quad PF,6+ Mos 03/26/2013, 03/30/2014   Moderna Sars-Covid-2 Vaccination 07/31/2019, 08/28/2019, 03/15/2020   Pneumococcal Conjugate-13 06/17/2013   Pneumococcal Polysaccharide-23 02/24/2009   Td 02/24/2006   Tdap 04/27/2011   Zoster Recombinant(Shingrix ) 07/22/2018  Zoster, Live 07/27/2006    Diabetes He presents for his follow-up diabetic visit. He has type 2 diabetes mellitus. Onset time: He was diagnosed at approximate age of 48 years. His disease course has been fluctuating. There are no hypoglycemic associated symptoms. Pertinent negatives for hypoglycemia include no confusion, headaches, pallor or seizures. Pertinent negatives for diabetes include no chest pain, no fatigue,  no polydipsia, no polyphagia, no polyuria, no weakness and no weight loss. There are no hypoglycemic complications. Symptoms are stable. Diabetic complications include heart disease and nephropathy. Risk factors for coronary artery disease include diabetes mellitus, dyslipidemia, family history, male sex, hypertension, sedentary lifestyle, tobacco exposure and obesity. Current diabetic treatment includes oral agent (monotherapy) and insulin  injections (Trulicity ). He is compliant with treatment most of the time. His weight is fluctuating minimally. He is following a generally healthy diet. When asked about meal planning, he reported none. He has not had a previous visit with a dietitian. He participates in exercise intermittently. His home blood glucose trend is increasing steadily. His breakfast blood glucose range is generally 140-180 mg/dl. His lunch blood glucose range is generally 180-200 mg/dl. His dinner blood glucose range is generally >200 mg/dl. His bedtime blood glucose range is generally >200 mg/dl. His overall blood glucose range is >200 mg/dl. (He presents today with his CGM and logs showing slightly worsening glycemic profile.  His POCT A1c today is 9.3%, increasing from last visit of 8.7%.  He did not tolerate the Ozempic , had severe diarrhea, thus was switched back to Trulicity  1.5 mg weekly.  Analysis of his CGM shows TIR 23%, TAR 77%, TBR 0% with a GMI of 8.6%.  He was not eligible for an upgrade of his CGM as he is a few months shy of using the 14 day libre for 5 years, can upgrade in August.) An ACE inhibitor/angiotensin II receptor blocker is being taken. He does not see a podiatrist.Eye exam is current.  Hyperlipidemia This is a chronic problem. The current episode started more than 1 year ago. The problem is controlled. Recent lipid tests were reviewed and are normal. Exacerbating diseases include chronic renal disease, diabetes and obesity. Factors aggravating his hyperlipidemia include  beta blockers. Pertinent negatives include no chest pain, myalgias or shortness of breath. Current antihyperlipidemic treatment includes statins (Repatha  every 2 weeks. Only takes his statin once every few days dt myalgias). The current treatment provides moderate improvement of lipids. Compliance problems include medication side effects.  Risk factors for coronary artery disease include male sex, obesity, family history, dyslipidemia, diabetes mellitus, hypertension and a sedentary lifestyle.  Hypertension This is a chronic problem. The current episode started more than 1 year ago. The problem has been gradually improving since onset. The problem is controlled. Pertinent negatives include no chest pain, headaches, neck pain, palpitations or shortness of breath. There are no associated agents to hypertension. Risk factors for coronary artery disease include dyslipidemia, diabetes mellitus, family history, male gender, obesity, sedentary lifestyle and smoking/tobacco exposure. Past treatments include angiotensin blockers, diuretics and beta blockers. The current treatment provides moderate improvement. There are no compliance problems.  Hypertensive end-organ damage includes CAD/MI. Identifiable causes of hypertension include chronic renal disease.    Review of systems  Constitutional: + Minimally fluctuating body weight,  current Body mass index is 29.77 kg/m. , no fatigue, no subjective hyperthermia, no subjective hypothermia Eyes: no blurry vision, no xerophthalmia ENT: no sore throat, no nodules palpated in throat, no dysphagia/odynophagia, no hoarseness Cardiovascular: no chest pain, no shortness of  breath, no palpitations, no leg swelling Respiratory: no cough, no shortness of breath Gastrointestinal: no nausea/vomiting/diarrhea Musculoskeletal: no muscle/joint aches Skin: no rashes, no hyperemia Neurological: no tremors, no numbness, no tingling, no dizziness Psychiatric: no depression, no  anxiety   Objective:    BP 126/76 (BP Location: Left Arm, Patient Position: Sitting, Cuff Size: Large)   Pulse 72   Ht 5\' 8"  (1.727 m)   Wt 195 lb 12.8 oz (88.8 kg)   BMI 29.77 kg/m   Wt Readings from Last 3 Encounters:  11/01/23 195 lb 12.8 oz (88.8 kg)  08/06/23 197 lb (89.4 kg)  07/03/23 197 lb (89.4 kg)    BP Readings from Last 3 Encounters:  11/01/23 126/76  07/03/23 123/73  05/28/23 128/79    Physical Exam- Limited  Constitutional:  Body mass index is 29.77 kg/m. , not in acute distress, normal state of mind Eyes:  EOMI, no exophthalmos Musculoskeletal: no gross deformities, strength intact in all four extremities, no gross restriction of joint movements Skin:  no rashes, no hyperemia Neurological: no tremor with outstretched hands   Diabetic Foot Exam - Simple   Simple Foot Form Diabetic Foot exam was performed with the following findings: Yes 11/01/2023  2:37 PM  Visual Inspection No deformities, no ulcerations, no other skin breakdown bilaterally: Yes Sensation Testing Intact to touch and monofilament testing bilaterally: Yes Pulse Check Posterior Tibialis and Dorsalis pulse intact bilaterally: Yes Comments    CMP     Component Value Date/Time   NA 137 05/28/2023 0849   K 4.4 05/28/2023 0849   CL 97 05/28/2023 0849   CO2 23 05/28/2023 0849   GLUCOSE 168 (H) 05/28/2023 0849   GLUCOSE 154 (H) 05/29/2018 0217   BUN 10 05/28/2023 0849   CREATININE 1.05 05/28/2023 0849   CREATININE 0.95 12/31/2012 0857   CALCIUM  9.7 05/28/2023 0849   PROT 6.6 05/28/2023 0849   ALBUMIN 4.1 05/28/2023 0849   AST 19 05/28/2023 0849   ALT 17 05/28/2023 0849   ALKPHOS 75 05/28/2023 0849   BILITOT 0.3 05/28/2023 0849   GFRNONAA 68 07/07/2020 1028   GFRNONAA 81 12/31/2012 0857   GFRAA 78 07/07/2020 1028   GFRAA >89 12/31/2012 0857    Diabetic Labs (most recent): Lab Results  Component Value Date   HGBA1C 9.3 (A) 11/01/2023   HGBA1C 8.7 (A) 07/03/2023   HGBA1C 8.8  (A) 12/20/2022   MICROALBUR 30 10/04/2021   MICROALBUR 10 12/30/2020   MICROALBUR 20 01/26/2015     Lipid Panel ( most recent) Lipid Panel     Component Value Date/Time   CHOL 107 05/28/2023 0849   CHOL 157 12/31/2012 0857   TRIG 172 (H) 05/28/2023 0849   TRIG 271 (H) 02/17/2016 0824   TRIG 184 (H) 12/31/2012 0857   HDL 44 05/28/2023 0849   HDL 44 02/17/2016 0824   HDL 45 12/31/2012 0857   CHOLHDL 2.4 05/28/2023 0849   LDLCALC 35 05/28/2023 0849   LDLCALC 52 01/19/2014 1007   LDLCALC 75 12/31/2012 0857   LDLDIRECT 39 11/02/2016 0806      Lab Results  Component Value Date   TSH 3.350 07/07/2020   TSH 2.720 12/30/2019   TSH 2.290 09/03/2014   FREET4 1.42 07/07/2020     Assessment & Plan:   1) DM type 2 causing vascular disease (HCC)  -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  - Moreland Degenova has currently uncontrolled symptomatic type 2 DM since  78  years of age.  He presents today with his CGM and logs showing slightly worsening glycemic profile.  His POCT A1c today is 9.3%, increasing from last visit of 8.7%.  He did not tolerate the Ozempic , had severe diarrhea, thus was switched back to Trulicity  1.5 mg weekly.  Analysis of his CGM shows TIR 23%, TAR 77%, TBR 0% with a GMI of 8.6%.  He was not eligible for an upgrade of his CGM as he is a few months shy of using the 14 day libre for 5 years, can upgrade in August.  -I have reviewed his recent labs with him.  - I had a long discussion with him about the progressive nature of diabetes and the pathology behind its complications. -his diabetes is complicated by coronary artery disease, obesity and he remains at a high risk for more acute and chronic complications which include CAD, CVA, CKD, retinopathy, and neuropathy. These are all discussed in detail with him.  The following Lifestyle Medicine recommendations according to American College of Lifestyle Medicine [facility]) were discussed  and offered to patient and he agrees to start the journey:  A. Whole Foods, Plant-based plate comprising of fruits and vegetables, plant-based proteins, whole-grain carbohydrates was discussed in detail with the patient.   A list for source of those nutrients were also provided to the patient.  Patient will use only water  or unsweetened tea for hydration. B.  The need to stay away from risky substances including alcohol, smoking; obtaining 7 to 9 hours of restorative sleep, at least 150 minutes of moderate intensity exercise weekly, the importance of healthy social connections,  and stress reduction techniques were discussed. C.  A full color page of  Calorie density of various food groups per pound showing examples of each food groups was provided to the patient.  - Nutritional counseling repeated at each appointment due to patients tendency to fall back in to old habits.  - The patient admits there is a room for improvement in their diet and drink choices. -  Suggestion is made for the patient to avoid simple carbohydrates from their diet including Cakes, Sweet Desserts / Pastries, Ice Cream, Soda (diet and regular), Sweet Tea, Candies, Chips, Cookies, Sweet Pastries, Store Bought Juices, Alcohol in Excess of 1-2 drinks a day, Artificial Sweeteners, Coffee Creamer, and "Sugar-free" Products. This will help patient to have stable blood glucose profile and potentially avoid unintended weight gain.   - I encouraged the patient to switch to unprocessed or minimally processed complex starch and increased protein intake (animal or plant source), fruits, and vegetables.   - Patient is advised to stick to a routine mealtimes to eat 3 meals a day and avoid unnecessary snacks (to snack only to correct hypoglycemia).  - I have approached him with the following individualized plan to manage diabetes and patient agrees:   -He is advised to continue his Lantus  80 units SQ nightly, continue Metformin  1000 mg ER  daily with breakfast, and will increase his Trulicity  to 4.5 mg SQ weekly.  He has an overabundance of 1.5 mg doses at home and is advised to go ahead and double up on those to bridge to the 4.5 mg dose.  He did NOT tolerate Ozempic , had severe diarrhea.    -He is encouraged to continue monitoring blood glucose using his CGM at least 4 times per day, before meals and at bedtime and call the clinic if he gets readings less than 70 or greater than 200 for 3 tests  in a row.  I will try resending in upgrade at next visit.  - Patient specific target  A1c;  LDL, HDL, Triglycerides,  were discussed in detail.  2) Blood Pressure /Hypertension:   His blood pressure is controlled to target.  He is advised to continue Metoprolol  100 mg po daily and Benicar  HCT 40-25 mg po daily.    3) Lipids/Hyperlipidemia: His most recent lipid panel from 05/28/23 shows controlled LDL of 35 and elevated triglycerides of 172 (improving) . He is statin intolerant with myalgias.  He has tolerated taking his Crestor  every 2-3 days.  He is advised to continue taking his Crestor  40 mg po every 3 days or so and continue his Repatha  140 mg SQ every 2 weeks.  Side effects and precautions discussed with him.    4)  Weight/Diet:  His Body mass index is 29.77 kg/m.--   He has lost some weight over the last few visits and will benefit from continued weight loss.  I discussed with him the fact that loss of 5 - 10% of his  current body weight will have the most impact on his diabetes management.  CDE Consult will be initiated . Exercise, and detailed carbohydrates information provided  -  detailed on discharge instructions.  5) Chronic Care/Health Maintenance: -he is on ACEI/ARB and Statin medications and is encouraged to initiate and continue to follow up with Ophthalmology, Dentist, Podiatrist at least yearly or according to recommendations, and advised to  stay away from smoking. I have recommended yearly flu vaccine and pneumonia vaccine  at least every 5 years; moderate intensity exercise for up to 150 minutes weekly; and  sleep for at least 7 hours a day.  - he is advised to maintain close follow up with Roise Cleaver, MD for primary care needs, as well as his other providers for optimal and coordinated care.     I spent  40  minutes in the care of the patient today including review of labs from CMP, Lipids, Thyroid  Function, Hematology (current and previous including abstractions from other facilities); face-to-face time discussing  his blood glucose readings/logs, discussing hypoglycemia and hyperglycemia episodes and symptoms, medications doses, his options of short and long term treatment based on the latest standards of care / guidelines;  discussion about incorporating lifestyle medicine;  and documenting the encounter. Risk reduction counseling performed per USPSTF guidelines to reduce obesity and cardiovascular risk factors.     Please refer to Patient Instructions for Blood Glucose Monitoring and Insulin /Medications Dosing Guide"  in media tab for additional information. Please  also refer to " Patient Self Inventory" in the Media  tab for reviewed elements of pertinent patient history.  Linda Repress participated in the discussions, expressed understanding, and voiced agreement with the above plans.  All questions were answered to his satisfaction. he is encouraged to contact clinic should he have any questions or concerns prior to his return visit.    Follow up plan: - Return in about 4 months (around 03/03/2024) for Diabetes F/U with A1c in office, No previsit labs, Bring meter and logs.   Hulon Magic, [facility] [address] [place], [place] [address] Phone: [phone] Fax: [phone]  11/02/2023, 7:00 AM

## 2023-11-08 ENCOUNTER — Other Ambulatory Visit: Payer: Self-pay | Admitting: Family Medicine

## 2023-11-08 DIAGNOSIS — Z8679 Personal history of other diseases of the circulatory system: Secondary | ICD-10-CM

## 2023-11-08 DIAGNOSIS — E1169 Type 2 diabetes mellitus with other specified complication: Secondary | ICD-10-CM

## 2023-11-18 ENCOUNTER — Other Ambulatory Visit: Payer: Self-pay | Admitting: Family Medicine

## 2023-11-19 ENCOUNTER — Other Ambulatory Visit: Payer: Self-pay | Admitting: Family Medicine

## 2023-11-19 DIAGNOSIS — I10 Essential (primary) hypertension: Secondary | ICD-10-CM

## 2023-11-20 ENCOUNTER — Other Ambulatory Visit: Payer: Self-pay | Admitting: Family Medicine

## 2023-11-26 ENCOUNTER — Encounter: Payer: Self-pay | Admitting: Family Medicine

## 2023-11-26 ENCOUNTER — Ambulatory Visit (INDEPENDENT_AMBULATORY_CARE_PROVIDER_SITE_OTHER): Payer: TRICARE For Life (TFL) | Admitting: Family Medicine

## 2023-11-26 VITALS — BP 114/72 | HR 71 | Temp 98.1°F | Ht 68.0 in | Wt 192.0 lb

## 2023-11-26 DIAGNOSIS — R350 Frequency of micturition: Secondary | ICD-10-CM | POA: Diagnosis not present

## 2023-11-26 DIAGNOSIS — E782 Mixed hyperlipidemia: Secondary | ICD-10-CM

## 2023-11-26 DIAGNOSIS — N401 Enlarged prostate with lower urinary tract symptoms: Secondary | ICD-10-CM | POA: Diagnosis not present

## 2023-11-26 DIAGNOSIS — Z7985 Long-term (current) use of injectable non-insulin antidiabetic drugs: Secondary | ICD-10-CM | POA: Diagnosis not present

## 2023-11-26 DIAGNOSIS — E611 Iron deficiency: Secondary | ICD-10-CM | POA: Diagnosis not present

## 2023-11-26 DIAGNOSIS — E1159 Type 2 diabetes mellitus with other circulatory complications: Secondary | ICD-10-CM | POA: Diagnosis not present

## 2023-11-26 MED ORDER — ROSUVASTATIN CALCIUM 40 MG PO TABS: 40.0000 mg | ORAL_TABLET | ORAL | 3 refills | Status: AC

## 2023-11-26 NOTE — Progress Notes (Signed)
 Subjective:  Patient ID: Marvin Frost,  male    DOB: Sep 11, 1945  Age: 78 y.o.    CC: Medical Management of Chronic Issues (No concerns )   HPI Marvin Frost presents for  follow-up of hypertension. Patient has no history of headache chest pain or shortness of breath or recent cough. Patient also denies symptoms of TIA such as numbness weakness lateralizing. Patient denies side effects from medication. States taking it regularly.  Patient also  in for follow-up of elevated cholesterol. Doing well without complaints on current medication. Denies side effects  including myalgia and arthralgia and nausea. Also in today for liver function testing. Currently no chest pain, shortness of breath or other cardiovascular related symptoms noted.  Follow-up of diabetes. Patient does check blood sugar at home. Can't tolerate ozempic  (diarrhea) Starting Trulicity  4.5  this week. Patient denies symptoms such as excessive hunger or urinary frequency, excessive hunger, nausea No significant hypoglycemic spells noted. Medications reviewed. Pt reports taking them regularly. Pt. denies complication/adverse reaction today.    History Marvin Frost has a past medical history of Allergy, Anemia, Arthritis, CAD (coronary artery disease), Cancer (HCC), Cataract, Cervical radiculopathy (09/14/2020), Diabetes mellitus (1995), Dysrhythmia, Gastric polyps (09/18/2017), GERD (gastroesophageal reflux disease), History of frequent urinary tract infections, History of hiatal hernia, Hyperlipemia, Hypertension, Obesity, and Pancreatitis.   He has a past surgical history that includes right shoulder surgery; Hydrocele excision / repair; Colonoscopy (11/2003); Upper gastrointestinal endoscopy; Cardiac catheterization; left calf surgery ; left index finger arthrioscopic surgery ; lymph node removed from right neck ; Total hip arthroplasty (Right, 03/16/2015); Total hip arthroplasty (Left, 06/22/2015); Eye surgery (Bilateral); LEFT HEART CATH  AND CORONARY ANGIOGRAPHY (N/A, 05/28/2018); CORONARY STENT INTERVENTION (N/A, 05/28/2018); CORONARY PRESSURE/FFR STUDY (N/A, 05/28/2018); and Joint replacement (2017).   His family history includes Diabetes in his brother; Early death in his father; GI problems in his sister; Healthy in his daughter; Heart attack in his mother; Heart attack (age of onset: 18) in his father; Heart attack (age of onset: 45) in his brother; Heart disease in his brother, father, and mother.He reports that he quit smoking about 39 years ago. His smoking use included cigarettes. He started smoking about 49 years ago. He has a 10 pack-year smoking history. He quit smokeless tobacco use about 45 years ago.  His smokeless tobacco use included chew. He reports that he does not drink alcohol and does not use drugs.  Current Outpatient Medications on File Prior to Visit  Medication Sig Dispense Refill   aspirin  EC 81 MG EC tablet Take 1 tablet (81 mg total) by mouth daily.     cetirizine (ZYRTEC) 10 MG tablet Take 10 mg by mouth as needed for allergies or rhinitis.      Continuous Glucose Sensor (FREESTYLE LIBRE 3 PLUS SENSOR) MISC Change sensor every 15 days. 6 each 3   Dulaglutide  (TRULICITY ) 4.5 MG/0.5ML SOAJ Inject 4.5 mg as directed once a week. 6 mL 3   ferrous sulfate  325 (65 FE) MG tablet Take 1 tablet (325 mg total) by mouth every other day. (Patient taking differently: Take 325 mg by mouth every morning.) 90 tablet 1   fluocinolone  (SYNALAR ) 0.01 % external solution Apply topically 2 (two) times daily. To scalp 60 mL 5   fluticasone  (FLONASE ) 50 MCG/ACT nasal spray Place 2 sprays into both nostrils daily. 16 g 6   glucose blood (FREESTYLE LITE) test strip Use as instructed to monitor glucose 2 times daily 100 each 12   insulin  glargine (LANTUS )  100 UNIT/ML injection Inject 0.8 mLs (80 Units total) into the skin at bedtime. 72 mL 3   Insulin  Pen Needle (NOVOTWIST) 32G X 5 MM MISC USE WITH INSULIN  PENS UP TO 5 TIMES DAILY  Dx E11.59 500 each 4   Insulin  Pen Needle 32G X 5 MM MISC Use to inject insulin  1 time daily 100 each 3   Lancets (FREESTYLE) lancets Check BS BID and PRN. DX.E11.9 300 each 3   metFORMIN  (GLUCOPHAGE -XR) 500 MG 24 hr tablet Take 2 tablets (1,000 mg total) by mouth daily with breakfast. 180 tablet 3   metoprolol  succinate (TOPROL -XL) 100 MG 24 hr tablet TAKE 1 TABLET DAILY FOR BLOOD PRESSURE CONTROL 90 tablet 1   nitroGLYCERIN  (NITROSTAT ) 0.4 MG SL tablet DISSOLVE 1 TABLET UNDER THE TONGUE EVERY 5 MINUTES AS NEEDED FOR CHEST PAIN 25 tablet 11   olmesartan -hydrochlorothiazide  (BENICAR  HCT) 40-25 MG tablet Take 1 tablet by mouth daily. 90 tablet 3   pantoprazole  (PROTONIX ) 40 MG tablet TAKE 1 TABLET DAILY 90 tablet 1   REPATHA  SURECLICK 140 MG/ML SOAJ INJECT 140 MG UNDER THE SKIN EVERY 14 DAYS 6 mL 3   No current facility-administered medications on file prior to visit.    ROS Review of Systems  Constitutional: Negative.   HENT: Negative.    Eyes:  Negative for visual disturbance.  Respiratory:  Negative for cough and shortness of breath.   Cardiovascular:  Negative for chest pain and leg swelling.  Gastrointestinal:  Negative for abdominal pain, diarrhea, nausea and vomiting.  Genitourinary:  Negative for difficulty urinating.  Musculoskeletal:  Negative for arthralgias and myalgias.  Skin:  Negative for rash.  Neurological:  Negative for headaches.  Psychiatric/Behavioral:  Negative for sleep disturbance.     Objective:  BP 114/72   Pulse 71   Temp 98.1 F (36.7 C)   Ht 5\' 8"  (1.727 m)   Wt 192 lb (87.1 kg)   SpO2 98%   BMI 29.19 kg/m   BP Readings from Last 3 Encounters:  11/26/23 114/72  11/01/23 126/76  07/03/23 123/73    Wt Readings from Last 3 Encounters:  11/26/23 192 lb (87.1 kg)  11/01/23 195 lb 12.8 oz (88.8 kg)  08/06/23 197 lb (89.4 kg)    Lab Results  Component Value Date   HGBA1C 9.3 (A) 11/01/2023   HGBA1C 8.7 (A) 07/03/2023   HGBA1C 8.8 (A)  12/20/2022    Physical Exam Vitals reviewed.  Constitutional:      Appearance: He is well-developed.  HENT:     Head: Normocephalic and atraumatic.     Right Ear: External ear normal.     Left Ear: External ear normal.     Mouth/Throat:     Pharynx: No oropharyngeal exudate or posterior oropharyngeal erythema.  Eyes:     Pupils: Pupils are equal, round, and reactive to light.  Cardiovascular:     Rate and Rhythm: Normal rate and regular rhythm.     Heart sounds: No murmur heard. Pulmonary:     Effort: No respiratory distress.     Breath sounds: Normal breath sounds.  Musculoskeletal:     Cervical back: Normal range of motion and neck supple.  Neurological:     Mental Status: He is alert and oriented to person, place, and time.         Assessment & Plan:  Benign prostatic hyperplasia with urinary frequency -     PSA, total and free  Mixed hyperlipidemia -     Rosuvastatin   Calcium ; Take 1 tablet (40 mg total) by mouth every 3 (three) days.  Dispense: 90 tablet; Refill: 3 -     CBC with Differential/Platelet -     CMP14+EGFR -     Lipid panel  DM type 2 causing vascular disease (HCC)   Seeing endo due to difficulty controlling DM.  Follow-up: Return in about 6 months (around 05/27/2024) for [facility]ete physical.  Marvin Frost, M.D.

## 2023-11-27 ENCOUNTER — Other Ambulatory Visit: Payer: Self-pay

## 2023-11-27 ENCOUNTER — Ambulatory Visit: Payer: Self-pay | Admitting: Family Medicine

## 2023-11-27 LAB — CBC WITH DIFFERENTIAL/PLATELET
Basophils Absolute: 0.3 10*3/uL — ABNORMAL HIGH (ref 0.0–0.2)
Basos: 3 %
EOS (ABSOLUTE): 1.7 10*3/uL — ABNORMAL HIGH (ref 0.0–0.4)
Eos: 20 %
Hematocrit: 38.6 % (ref 37.5–51.0)
Hemoglobin: 11.1 g/dL — ABNORMAL LOW (ref 13.0–17.7)
Immature Grans (Abs): 0 10*3/uL (ref 0.0–0.1)
Immature Granulocytes: 0 %
Lymphocytes Absolute: 1.4 10*3/uL (ref 0.7–3.1)
Lymphs: 16 %
MCH: 22.5 pg — ABNORMAL LOW (ref 26.6–33.0)
MCHC: 28.8 g/dL — ABNORMAL LOW (ref 31.5–35.7)
MCV: 78 fL — ABNORMAL LOW (ref 79–97)
Monocytes Absolute: 0.6 10*3/uL (ref 0.1–0.9)
Monocytes: 7 %
Neutrophils Absolute: 4.7 10*3/uL (ref 1.4–7.0)
Neutrophils: 53 %
Platelets: 373 10*3/uL (ref 150–450)
RBC: 4.93 x10E6/uL (ref 4.14–5.80)
RDW: 18.5 % — ABNORMAL HIGH (ref 11.6–15.4)
WBC: 8.7 10*3/uL (ref 3.4–10.8)

## 2023-11-27 LAB — CMP14+EGFR
ALT: 15 IU/L (ref 0–44)
AST: 15 IU/L (ref 0–40)
Albumin: 4.3 g/dL (ref 3.8–4.8)
Alkaline Phosphatase: 70 IU/L (ref 44–121)
BUN/Creatinine Ratio: 12 (ref 10–24)
BUN: 12 mg/dL (ref 8–27)
Bilirubin Total: 0.4 mg/dL (ref 0.0–1.2)
CO2: 21 mmol/L (ref 20–29)
Calcium: 9.7 mg/dL (ref 8.6–10.2)
Chloride: 99 mmol/L (ref 96–106)
Creatinine, Ser: 1.02 mg/dL (ref 0.76–1.27)
Globulin, Total: 2.5 g/dL (ref 1.5–4.5)
Glucose: 158 mg/dL — ABNORMAL HIGH (ref 70–99)
Potassium: 4.8 mmol/L (ref 3.5–5.2)
Sodium: 134 mmol/L (ref 134–144)
Total Protein: 6.8 g/dL (ref 6.0–8.5)
eGFR: 74 mL/min/{1.73_m2} (ref >59)

## 2023-11-27 LAB — LIPID PANEL
Chol/HDL Ratio: 2.2 ratio (ref 0.0–5.0)
Cholesterol, Total: 95 mg/dL — ABNORMAL LOW (ref 100–199)
HDL: 43 mg/dL (ref >39)
LDL Chol Calc (NIH): 31 mg/dL (ref 0–99)
Triglycerides: 116 mg/dL (ref 0–149)
VLDL Cholesterol Cal: 21 mg/dL (ref 5–40)

## 2023-11-27 LAB — PSA, TOTAL AND FREE
PSA, Free Pct: 23.8 %
PSA, Free: 0.88 ng/mL
Prostate Specific Ag, Serum: 3.7 ng/mL (ref 0.0–4.0)

## 2023-11-29 LAB — IRON AND TIBC
Iron Saturation: 6 % — CL (ref 15–55)
Iron: 21 ug/dL — ABNORMAL LOW (ref 38–169)
Total Iron Binding Capacity: 378 ug/dL (ref 250–450)
UIBC: 357 ug/dL — ABNORMAL HIGH (ref 111–343)

## 2023-11-29 LAB — FERRITIN: Ferritin: 36 ng/mL (ref 30–400)

## 2023-11-29 LAB — SPECIMEN STATUS REPORT

## 2023-12-11 ENCOUNTER — Other Ambulatory Visit: Payer: Self-pay | Admitting: Family Medicine

## 2023-12-14 ENCOUNTER — Other Ambulatory Visit

## 2023-12-14 DIAGNOSIS — N401 Enlarged prostate with lower urinary tract symptoms: Secondary | ICD-10-CM

## 2023-12-17 ENCOUNTER — Other Ambulatory Visit: Payer: Self-pay

## 2023-12-17 ENCOUNTER — Telehealth: Payer: Self-pay | Admitting: Cardiology

## 2023-12-17 DIAGNOSIS — N401 Enlarged prostate with lower urinary tract symptoms: Secondary | ICD-10-CM

## 2023-12-17 DIAGNOSIS — R350 Frequency of micturition: Secondary | ICD-10-CM | POA: Diagnosis not present

## 2023-12-17 NOTE — Telephone Encounter (Signed)
 Pt c/o BP issue: STAT if pt c/o blurred vision, one-sided weakness or slurred speech.  STAT if BP is GREATER than 180/120 TODAY.  STAT if BP is LESS than 90/60 and SYMPTOMATIC TODAY  1. What is your BP concern? Low BP  2. Have you taken any BP medication today?not yet  3. What are your last 5 BP readings? 62/31 @ 9:30 this morning, 9:45 95/52 hr91  4. Are you having any other symptoms (ex. Dizziness, headache, blurred vision, passed out)? lightheaded

## 2023-12-17 NOTE — Telephone Encounter (Signed)
 Spoke with pt who reports feeling off this morning upon waking.  Pt states he checked his BP which was initially 62/31 and 15 minutes later 95/52 HR 91.  Pt rechecked BP prior to [facility] call with reading of 124/72.  Pt also reports recent increased DOE.  Pt denies current CP, SOB or dizziness.  He states his BP cuff is old and does not know if that could be part of the issue with his lower readings.  Pt advised he should consider purchasing a new cuff due to the age of his current cuff.  Encouraged pt to check BP 2 hours after taking BP medications or anytime he is feeling lightheaded and keep a log.  May have a small salty snack if he see his blood pressure is below 100/60.  Encouraged to change positions slowly and maintain good blood pressure control.  Reviewed ED precautions.  Pt verbalizes understanding and agrees with current plan.

## 2023-12-18 ENCOUNTER — Ambulatory Visit: Payer: Self-pay | Admitting: Family Medicine

## 2023-12-18 LAB — FECAL OCCULT BLOOD, IMMUNOCHEMICAL: Fecal Occult Bld: NEGATIVE

## 2023-12-22 NOTE — Progress Notes (Unsigned)
 Cardiology Office Note    Date:  12/24/2023  ID:  Marvin Frost, DOB [birth year], MRN [id number] PCP:  Marvin Lowers, MD  Cardiologist:  Marvin Schilling, MD  Electrophysiologist:  None   Chief Complaint: Hypotension   History of Present Illness: .    Marvin Frost is a 78 y.o. male with visit-pertinent history of CAD s/p stenting of mid LAD in December 2019, hypertension, hyperlipidemia, type 2 diabetes mellitus.  Cardiac catheterization in 05/2018 indicated severe single-vessel CAD with severe to mid LAD stenosis, successfully treated with DES with 2.5 x 22 mm resolute Onyx DES.  Patient was last seen in clinic on 04/25/2023 by Dr. Schilling.  Patient had remained stable from a cardiac standpoint.  Today patient presents regarding hypotension and dizziness.  Patient reports that he has been doing well overall however has started noticing lower blood pressures.  He will be out working in his yard and become dizzy and lightheaded, he notes that he has occasionally felt faint.  He denies any syncope or passing out.  He denies any palpitations, lower extremity edema, orthopnea or PND.  He has been monitoring his blood pressure at home, readings varying between 92-100 systolic with one reading of 83/62.  Patient notes with lower blood pressure he is dizzy, lightheaded and has increased brain fog.  Patient overall denies significant chest pain, notes that he did wake up a week ago to use the restroom and felt an uncomfortable sensation in the middle of his back and to the left of his back, notes that he does have spinal disc problems, he took Tylenol  and went back to bed with resolution of discomfort.  He denies any recurrence.  He denies any chest pain or significant shortness of breath when working out in his yard.  Labwork independently reviewed: 11/26/2023: Sodium 134, potassium 4.8, creatinine 1.02, AST 15, ALT 15, iron saturation 6, UIBC 357 ROS: .   Today he denies shortness of breath, lower  extremity edema, fatigue, palpitations, melena, hematuria, hemoptysis, diaphoresis, syncope, orthopnea, and PND.  All other systems are reviewed and otherwise negative. Studies Reviewed: SABRA   EKG:  EKG is ordered today, personally reviewed, demonstrating  EKG Interpretation Date/Time:  Monday December 24 2023 11:33:38 EDT Ventricular Rate:  72 PR Interval:  186 QRS Duration:  82 QT Interval:  368 QTC Calculation: 402 R Axis:   -45  Text Interpretation: Normal sinus rhythm Left axis deviation Possible Anterolateral infarct , age undetermined When compared with ECG of 25-Apr-2023 14:58, No significant change was found Confirmed by Jonae Renshaw [phone]) on 12/24/2023 5:39:40 PM   CV Studies: Cardiac studies reviewed are outlined and summarized above. Otherwise please see EMR for full report. Cardiac Studies & Procedures   ______________________________________________________________________________________________ CARDIAC CATHETERIZATION  CARDIAC CATHETERIZATION 05/28/2018  Conclusion  Mid LAD lesion is 75% stenosed.  Dist LAD lesion is 40% stenosed.  Ost LAD lesion is 40% stenosed.  A drug-eluting stent was successfully placed using a STENT RESOLUTE ONYX 2.5X22.  Post intervention, there is a 0% residual stenosis.  The left ventricular systolic function is normal.  LV end diastolic pressure is normal.  The left ventricular ejection fraction is 55-65% by visual estimate.  1. Severe single vessel CAD with severe mid-LAD stenosis, hemodynamic significance confirmed by FFR, treated successfully with DES placement (2.5x22 mm Resolute Onyx DES). 2. Widely patent, dominant LCx 3. Patent nondominant RCA 4. Normal LV systolic function with normal LVEDP  OK for DC tomorrow am if no complications. If difficulty tolerating  brilinta  would be comfortable with clopidogrel .  Findings Coronary Findings Diagnostic  Dominance: Left  Left Anterior Descending Ost LAD lesion is 40% stenosed. Mid  LAD lesion is 75% stenosed. calcified lesion. DFR and FFR performed. DFR = 0.89 (borderline significant). FFR with IV adenosine  = 0.73 strongly positive. PCI performed (see PCI note) Dist LAD lesion is 40% stenosed. The lesion is calcified. diffuse distal vessel disease, vessel wraps around LV apex.  Ramus Intermedius Vessel is moderate in size. The vessel exhibits minimal luminal irregularities.  Left Circumflex Vessel is large. The vessel exhibits minimal luminal irregularities.  First Obtuse Marginal Branch The vessel exhibits minimal luminal irregularities.  Left Posterior Descending Artery The vessel exhibits minimal luminal irregularities.  Right Coronary Artery Vessel is moderate in size. The vessel exhibits minimal luminal irregularities. Nondominant RCA  Intervention  Mid LAD lesion Stent CATH LAUNCHER 33F EBU3.5 guide catheter was inserted. Lesion crossed with guidewire using a GUIDEWIRE COMET PRESSURE. Pre-stent angioplasty was performed using a BALLOON SAPPHIRE 2.5X15. A drug-eluting stent was successfully placed using a STENT RESOLUTE ONYX 2.5X22. Post-stent angioplasty was performed using a BALLOON SAPPHIRE Jansen [id number]. Maximum pressure:  16 atm. Post-Intervention Lesion Assessment The intervention was successful. Pre-interventional TIMI flow is 3. Post-intervention TIMI flow is 3. No complications occurred at this lesion. There is a 0% residual stenosis post intervention.   STRESS TESTS  EXERCISE TOLERANCE TEST (ETT) 12/29/2016  Narrative  Blood pressure demonstrated a normal response to exercise.  There was no ST segment deviation noted during stress.  Clinically and electrically negative for ischemia            ______________________________________________________________________________________________       Current Reported Medications:.    Current Meds  Medication Sig   aspirin  EC 81 MG EC tablet Take 1 tablet (81 mg total) by mouth daily.    cetirizine (ZYRTEC) 10 MG tablet Take 10 mg by mouth as needed for allergies or rhinitis.    Continuous Glucose Sensor (FREESTYLE LIBRE 3 PLUS SENSOR) MISC Change sensor every 15 days.   Dulaglutide  (TRULICITY ) 4.5 MG/0.5ML SOAJ Inject 4.5 mg as directed once a week.   ferrous sulfate  325 (65 FE) MG tablet Take 1 tablet (325 mg total) by mouth every other day. (Patient taking differently: Take 325 mg by mouth every morning.)   fluocinolone  (SYNALAR ) 0.01 % external solution Apply topically 2 (two) times daily. To scalp   fluticasone  (FLONASE ) 50 MCG/ACT nasal spray Place 2 sprays into both nostrils daily.   glucose blood (FREESTYLE LITE) test strip Use as instructed to monitor glucose 2 times daily   insulin  glargine (LANTUS ) 100 UNIT/ML injection Inject 0.8 mLs (80 Units total) into the skin at bedtime.   Insulin  Pen Needle (NOVOTWIST) 32G X 5 MM MISC USE WITH INSULIN  PENS UP TO 5 TIMES DAILY Dx E11.59   Insulin  Pen Needle 32G X 5 MM MISC Use to inject insulin  1 time daily   Lancets (FREESTYLE) lancets Check BS BID and PRN. DX.E11.9   metFORMIN  (GLUCOPHAGE -XR) 500 MG 24 hr tablet Take 2 tablets (1,000 mg total) by mouth daily with breakfast.   metoprolol  succinate (TOPROL -XL) 100 MG 24 hr tablet TAKE 1 TABLET DAILY FOR BLOOD PRESSURE CONTROL   nitroGLYCERIN  (NITROSTAT ) 0.4 MG SL tablet DISSOLVE 1 TABLET UNDER THE TONGUE EVERY 5 MINUTES AS NEEDED FOR CHEST PAIN   olmesartan  (BENICAR ) 40 MG tablet Take 1 tablet (40 mg total) by mouth daily.   pantoprazole  (PROTONIX ) 40 MG tablet TAKE 1 TABLET DAILY  REPATHA  SURECLICK 140 MG/ML SOAJ INJECT 140 MG UNDER THE SKIN EVERY 14 DAYS   rosuvastatin  (CRESTOR ) 40 MG tablet Take 1 tablet (40 mg total) by mouth every 3 (three) days.   [DISCONTINUED] olmesartan -hydrochlorothiazide  (BENICAR  HCT) 40-25 MG tablet Take 1 tablet by mouth daily.   Physical Exam:    VS:  BP 102/64   Pulse 72   Ht 5' 8 (1.727 m)   Wt 194 lb 9.6 oz (88.3 kg)   SpO2 98%   BMI  29.59 kg/m    Wt Readings from Last 3 Encounters:  12/24/23 194 lb 9.6 oz (88.3 kg)  11/26/23 192 lb (87.1 kg)  11/01/23 195 lb 12.8 oz (88.8 kg)    GEN: Well nourished, well developed in no acute distress NECK: No JVD; No carotid bruits CARDIAC: RRR, no murmurs, rubs, gallops RESPIRATORY:  Clear to auscultation without rales, wheezing or rhonchi  ABDOMEN: Soft, non-tender, non-distended EXTREMITIES:  No edema; No acute deformity     Asessement and Plan:SABRA    Hypotension: Blood pressure today 102/64, on recheck was 107/78.  Patient reports low blood pressures at home with associated dizziness, lightheadedness and feeling faint.  Patient reports that he will be out working in his yard or standing for prolonged peers of time and feels as though he could pass out, he denies any syncope.  Blood pressure readings varying between 92-100 systolic with one reading of 83/62 at home.  On chart review patient noted to be anemic with hemoglobin of 11, down from around 15 in January.  Patient has a history of iron deficiency anemia and has not been taking his iron consistently, he has recently restarted.  This is likely contributing in addition to dehydration and intentional weight loss from the last year.  Reviewed with patient will discontinue hydrochlorothiazide  and continue olmesartan  40 mg daily.  Encouraged patient to continue monitoring his blood pressure at home and if no improvement to notify the office, will decrease olmesartan  at that time.  CAD: Cardiac catheterization in 05/2018 indicated severe single-vessel CAD with severe to mid LAD stenosis, successfully treated with DES with 2.5 x 22 mm resolute Onyx DES. Stable with no anginal symptoms. No indication for ischemic evaluation. Heart healthy diet and regular cardiovascular exercise encouraged.  Continue aspirin  81 mg daily, Repatha , metoprolol  succinate 100 mg daily, Crestor  40 mg.  Patient to start olmesartan  40 mg daily and discontinue  hydrochlorothiazide .  Hyperlipidemia: Last lipid profile in 11/26/2023 indicated total cholesterol 95, HDL 43, triglycerides 116 and LDL 31.  Continue Repatha  and Crestor .   Disposition: F/u with Rickia Freeburg, NP in one month.   Signed, Marrah Vanevery D Kwasi Joung, NP

## 2023-12-24 ENCOUNTER — Ambulatory Visit: Attending: Cardiology | Admitting: Cardiology

## 2023-12-24 ENCOUNTER — Encounter: Payer: Self-pay | Admitting: Cardiology

## 2023-12-24 VITALS — BP 102/64 | HR 72 | Ht 68.0 in | Wt 194.6 lb

## 2023-12-24 DIAGNOSIS — D509 Iron deficiency anemia, unspecified: Secondary | ICD-10-CM | POA: Diagnosis not present

## 2023-12-24 DIAGNOSIS — I2510 Atherosclerotic heart disease of native coronary artery without angina pectoris: Secondary | ICD-10-CM | POA: Insufficient documentation

## 2023-12-24 DIAGNOSIS — I10 Essential (primary) hypertension: Secondary | ICD-10-CM | POA: Insufficient documentation

## 2023-12-24 DIAGNOSIS — I959 Hypotension, unspecified: Secondary | ICD-10-CM | POA: Diagnosis not present

## 2023-12-24 DIAGNOSIS — E118 Type 2 diabetes mellitus with unspecified complications: Secondary | ICD-10-CM

## 2023-12-24 DIAGNOSIS — E785 Hyperlipidemia, unspecified: Secondary | ICD-10-CM | POA: Diagnosis not present

## 2023-12-24 DIAGNOSIS — Z9861 Coronary angioplasty status: Secondary | ICD-10-CM | POA: Insufficient documentation

## 2023-12-24 MED ORDER — OLMESARTAN MEDOXOMIL 40 MG PO TABS: 40.0000 mg | ORAL_TABLET | Freq: Every day | ORAL | 3 refills | Status: DC

## 2023-12-24 NOTE — Patient Instructions (Addendum)
 Medication Instructions:  Stop Olmesartan  - hydrochlorothiazide  Start Olmesartan  40 mg once a day  *If you need a refill on your cardiac medications before your next appointment, please call your pharmacy*  Lab Work: No lab  Testing/Procedures: No testing  Follow-Up: At [facility], you and your health needs are our priority.  As part of our continuing mission to provide you with exceptional heart care, our providers are all part of one team.  This team includes your primary Cardiologist (physician) and Advanced Practice Providers or APPs (Physician Assistants and Nurse Practitioners) who all work together to provide you with the care you need, when you need it.  Your next appointment:   1 month(s)  Provider:   Katlyn West, NP   We recommend signing up for the patient portal called MyChart.  Sign up information is provided on this After Visit Summary.  MyChart is used to connect with patients for Virtual Visits (Telemedicine).  Patients are able to view lab/test results, encounter notes, upcoming appointments, etc.  Non-urgent messages can be sent to your provider as well.   To learn more about what you can do with MyChart, go to [email or web address].   Other Instructions Please take your blood pressure daily for 1 month and send in a MyChart message. Please include heart rates. (One message at the end of the 1 month).   HOW TO TAKE YOUR BLOOD PRESSURE: Rest 5 minutes before taking your blood pressure. Don't smoke or drink caffeinated beverages for at least 30 minutes before. Take your blood pressure before (not after) you eat. Sit comfortably with your back supported and both feet on the floor (don't cross your legs). Elevate your arm to heart level on a table or a desk. Use the proper sized cuff. It should fit smoothly and snugly around your bare upper arm. There should be enough room to slip a fingertip under the cuff. The bottom edge of the cuff should be 1 inch  above the crease of the elbow. Ideally, take 3 measurements at one sitting and record the average.

## 2024-01-01 MED ORDER — HYDROCHLOROTHIAZIDE 12.5 MG PO TABS: 12.5000 mg | ORAL_TABLET | Freq: Every day | ORAL | 2 refills | Status: DC

## 2024-01-01 MED ORDER — OLMESARTAN MEDOXOMIL 40 MG PO TABS: 40.0000 mg | ORAL_TABLET | Freq: Every day | ORAL | Status: DC

## 2024-01-23 NOTE — Progress Notes (Unsigned)
 Cardiology Office Note    Date:  01/24/2024  ID:  Marvin Frost, DOB [birth year], MRN [id number] PCP:  Zollie Lowers, MD  Cardiologist:  Lynwood Schilling, MD  Electrophysiologist:  None   Chief Complaint: Follow up for dizziness and lightheadedness   History of Present Illness: .    Marvin Frost is a 78 y.o. male with visit-pertinent history of CAD s/p stenting of mid LAD in December 2019, hypertension, hyperlipidemia, type 2 diabetes mellitus.  Cardiac catheterization in 05/2018 indicated severe single-vessel CAD with severe to mid LAD stenosis, successfully treated with DES with 2.5 x 22 mm resolute Onyx DES.  Patient was last seen in clinic on 04/25/2023 by Dr. Schilling.  Patient had remained stable from a cardiac standpoint.  Patient was seen in clinic on 12/24/2023 regarding hypotension and dizziness.  Patient reported that he been doing well overall however had started noticing low blood pressures at home.  Patient reported he would be out working in his yard to become dizzy and lightheaded.  He denied any true syncope.  Patient denied any palpitations, lower extremity Dema, orthopnea or PND.  Patient reported he been monitoring his blood pressure at home with readings varying between 92-100 systolic with 1 reading of 83/63.  Patient reported with low blood pressures he would be dizzy, lightheaded and had increased brain fog.  Patient's hydrochlorothiazide  was discontinued and he was started on losartan 40 mg daily, however following discontinuation of hydrochlorothiazide  patient reported increased lower extremity swelling.  Instructed to restart hydrochlorothiazide  at 12.5 mg daily and reduce olmesartan  to 20 mg daily.  Today he presents for follow-up.  He reports that he is doing very well overall. He denies any further dizziness or lightheadedness, patient reports that with restarting of hydrochlorothiazide  12.5 mg daily his lower extremity edema completely resolved.  Patient reports that he  has resumed activity and is doing very well.  He denies any chest pain, shortness of breath, lower extremity edema, orthopnea or PND.  Patient denies any palpitations, presyncope or syncope.  ROS: .   Today he denies chest pain, shortness of breath, lower extremity edema, fatigue, palpitations, melena, hematuria, hemoptysis, diaphoresis, weakness, presyncope, syncope, orthopnea, and PND.  All other systems are reviewed and otherwise negative. Studies Reviewed: SABRA   EKG:  EKG is not ordered today.  CV Studies: Cardiac studies reviewed are outlined and summarized above. Otherwise please see EMR for full report. Cardiac Studies & Procedures   ______________________________________________________________________________________________ CARDIAC CATHETERIZATION  CARDIAC CATHETERIZATION 05/28/2018  Conclusion  Mid LAD lesion is 75% stenosed.  Dist LAD lesion is 40% stenosed.  Ost LAD lesion is 40% stenosed.  A drug-eluting stent was successfully placed using a STENT RESOLUTE ONYX 2.5X22.  Post intervention, there is a 0% residual stenosis.  The left ventricular systolic function is normal.  LV end diastolic pressure is normal.  The left ventricular ejection fraction is 55-65% by visual estimate.  1. Severe single vessel CAD with severe mid-LAD stenosis, hemodynamic significance confirmed by FFR, treated successfully with DES placement (2.5x22 mm Resolute Onyx DES). 2. Widely patent, dominant LCx 3. Patent nondominant RCA 4. Normal LV systolic function with normal LVEDP  OK for DC tomorrow am if no complications. If difficulty tolerating brilinta  would be comfortable with clopidogrel .  Findings Coronary Findings Diagnostic  Dominance: Left  Left Anterior Descending Ost LAD lesion is 40% stenosed. Mid LAD lesion is 75% stenosed. calcified lesion. DFR and FFR performed. DFR = 0.89 (borderline significant). FFR with IV adenosine  = 0.73 strongly  positive. PCI performed (see PCI  note) Dist LAD lesion is 40% stenosed. The lesion is calcified. diffuse distal vessel disease, vessel wraps around LV apex.  Ramus Intermedius Vessel is moderate in size. The vessel exhibits minimal luminal irregularities.  Left Circumflex Vessel is large. The vessel exhibits minimal luminal irregularities.  First Obtuse Marginal Branch The vessel exhibits minimal luminal irregularities.  Left Posterior Descending Artery The vessel exhibits minimal luminal irregularities.  Right Coronary Artery Vessel is moderate in size. The vessel exhibits minimal luminal irregularities. Nondominant RCA  Intervention  Mid LAD lesion Stent CATH LAUNCHER 45F EBU3.5 guide catheter was inserted. Lesion crossed with guidewire using a GUIDEWIRE COMET PRESSURE. Pre-stent angioplasty was performed using a BALLOON SAPPHIRE 2.5X15. A drug-eluting stent was successfully placed using a STENT RESOLUTE ONYX 2.5X22. Post-stent angioplasty was performed using a BALLOON SAPPHIRE Duncan [id number]. Maximum pressure:  16 atm. Post-Intervention Lesion Assessment The intervention was successful. Pre-interventional TIMI flow is 3. Post-intervention TIMI flow is 3. No complications occurred at this lesion. There is a 0% residual stenosis post intervention.   STRESS TESTS  EXERCISE TOLERANCE TEST (ETT) 12/29/2016  Interpretation Summary  Blood pressure demonstrated a normal response to exercise.  There was no ST segment deviation noted during stress.  Clinically and electrically negative for ischemia            ______________________________________________________________________________________________       Current Reported Medications:.    Current Meds  Medication Sig   aspirin  EC 81 MG EC tablet Take 1 tablet (81 mg total) by mouth daily.   cetirizine (ZYRTEC) 10 MG tablet Take 10 mg by mouth as needed for allergies or rhinitis.    Continuous Glucose Sensor (FREESTYLE LIBRE 3 PLUS SENSOR) MISC Change  sensor every 15 days.   Dulaglutide  (TRULICITY ) 4.5 MG/0.5ML SOAJ Inject 4.5 mg as directed once a week.   ferrous sulfate  325 (65 FE) MG tablet Take 1 tablet (325 mg total) by mouth every other day. (Patient taking differently: Take 325 mg by mouth every morning.)   fluocinolone  (SYNALAR ) 0.01 % external solution Apply topically 2 (two) times daily. To scalp   fluticasone  (FLONASE ) 50 MCG/ACT nasal spray Place 2 sprays into both nostrils daily.   glucose blood (FREESTYLE LITE) test strip Use as instructed to monitor glucose 2 times daily   insulin  glargine (LANTUS ) 100 UNIT/ML injection Inject 0.8 mLs (80 Units total) into the skin at bedtime.   Insulin  Pen Needle (NOVOTWIST) 32G X 5 MM MISC USE WITH INSULIN  PENS UP TO 5 TIMES DAILY Dx E11.59   Insulin  Pen Needle 32G X 5 MM MISC Use to inject insulin  1 time daily   Lancets (FREESTYLE) lancets Check BS BID and PRN. DX.E11.9   metFORMIN  (GLUCOPHAGE -XR) 500 MG 24 hr tablet Take 2 tablets (1,000 mg total) by mouth daily with breakfast.   metoprolol  succinate (TOPROL -XL) 100 MG 24 hr tablet TAKE 1 TABLET DAILY FOR BLOOD PRESSURE CONTROL   nitroGLYCERIN  (NITROSTAT ) 0.4 MG SL tablet DISSOLVE 1 TABLET UNDER THE TONGUE EVERY 5 MINUTES AS NEEDED FOR CHEST PAIN   olmesartan  (BENICAR ) 40 MG tablet Take 1 tablet (40 mg total) by mouth daily.   pantoprazole  (PROTONIX ) 40 MG tablet TAKE 1 TABLET DAILY   REPATHA  SURECLICK 140 MG/ML SOAJ INJECT 140 MG UNDER THE SKIN EVERY 14 DAYS   rosuvastatin  (CRESTOR ) 40 MG tablet Take 1 tablet (40 mg total) by mouth every 3 (three) days.   [DISCONTINUED] hydrochlorothiazide  (HYDRODIURIL ) 12.5 MG tablet Take 1 tablet (12.5 mg  total) by mouth daily.   Physical Exam:    VS:  BP 130/69   Pulse 72   Ht 5' 8 (1.727 m)   Wt 193 lb (87.5 kg)   SpO2 97%   BMI 29.35 kg/m    Wt Readings from Last 3 Encounters:  01/24/24 193 lb (87.5 kg)  12/24/23 194 lb 9.6 oz (88.3 kg)  11/26/23 192 lb (87.1 kg)    GEN: Well nourished,  well developed in no acute distress NECK: No JVD; No carotid bruits CARDIAC: RRR, no murmurs, rubs, gallops RESPIRATORY:  Clear to auscultation without rales, wheezing or rhonchi  ABDOMEN: Soft, non-tender, non-distended EXTREMITIES:  No edema; No acute deformity     Asessement and Plan:SABRA    Hypotension: Blood pressure today 130/69. At last office visit patient reported low blood pressures at home associated with dizziness, lightheadedness and feeling faint.  Blood pressure readings varied between 92-100 systolic with 1 reading of 83/62 at home.  On chart review patient was noted to be anemic with hemoglobin of 11, down from 15 in January, patient endorsed a history of iron deficiency anemia and had not been taking his iron consistently.  Hypertension felt to be likely multifactorial with blood pressure medications, dehydration and intentional weight loss as well as anemia.  Patient's hydrochlorothiazide  was discontinued and continued somnolence olmesartan  40 mg daily however patient later notified the office of increased lower extremity edema and was restarted on hydrochlorothiazide  12.5 mg daily.  Today patient reports that he is doing significantly better, he denies any dizziness, lightheadedness, presyncope or syncope.  Patient reports that his lower extremity edema resolved with restarting of hydrochlorothiazide .  Patient's blood pressure log indicates well-controlled blood pressures, averaging less than 130/80. Check BMET today.  Following visit patient notified the office he had only been taking hydrochlorothiazide  12.5 mg daily, has not been taking Olmesartan . Discussed with patient as he had well controlled blood pressure and his lower extremity edema resolved on hydrochlorothiazide  will continue only on hydrochlorothiazide  12.5 mg daily, if his blood pressure increases recommend restarting olmesartan .   CAD: Cardiac catheterization in 05/2018 indicated severe single-vessel CAD with severe to  mid LAD stenosis, successfully treated with DES with 2.5 x 22 mm resolute Onyx DES. Stable with no anginal symptoms. No indication for ischemic evaluation. Heart healthy diet and regular cardiovascular exercise encouraged.  Continue aspirin  81 mg daily, Repatha , metoprolol  succinate 100 mg daily, Crestor  40 mg.   Hyperlipidemia: Last lipid profile in 11/26/2023 indicated total cholesterol 95, HDL 43, triglycerides 116 and LDL 31.  Continue Repatha  and Crestor .   Disposition: F/u with Dr. Lavona in 6 months or sooner if needed.   Signed, Marvin Frost Bedie Dominey, NP

## 2024-01-24 ENCOUNTER — Ambulatory Visit: Attending: Cardiology | Admitting: Cardiology

## 2024-01-24 ENCOUNTER — Encounter: Payer: Self-pay | Admitting: Cardiology

## 2024-01-24 VITALS — BP 130/69 | HR 72 | Ht 68.0 in | Wt 193.0 lb

## 2024-01-24 DIAGNOSIS — Z9861 Coronary angioplasty status: Secondary | ICD-10-CM | POA: Diagnosis not present

## 2024-01-24 DIAGNOSIS — I959 Hypotension, unspecified: Secondary | ICD-10-CM | POA: Insufficient documentation

## 2024-01-24 DIAGNOSIS — I10 Essential (primary) hypertension: Secondary | ICD-10-CM | POA: Diagnosis not present

## 2024-01-24 DIAGNOSIS — E785 Hyperlipidemia, unspecified: Secondary | ICD-10-CM | POA: Insufficient documentation

## 2024-01-24 DIAGNOSIS — D509 Iron deficiency anemia, unspecified: Secondary | ICD-10-CM | POA: Insufficient documentation

## 2024-01-24 DIAGNOSIS — I2510 Atherosclerotic heart disease of native coronary artery without angina pectoris: Secondary | ICD-10-CM | POA: Insufficient documentation

## 2024-01-24 MED ORDER — HYDROCHLOROTHIAZIDE 12.5 MG PO TABS: 12.5000 mg | ORAL_TABLET | Freq: Every day | ORAL | 3 refills | Status: AC

## 2024-01-24 NOTE — Patient Instructions (Signed)
 Medication Instructions:  No changes *If you need a refill on your cardiac medications before your next appointment, please call your pharmacy*  Lab Work: Today we are going to draw a Bmet If you have labs (blood work) drawn today and your tests are completely normal, you will receive your results only by: MyChart Message (if you have MyChart) OR A paper copy in the mail If you have any lab test that is abnormal or we need to change your treatment, we will call you to review the results.  Testing/Procedures: No testing  Follow-Up: At [facility], you and your health needs are our priority.  As part of our continuing mission to provide you with exceptional heart care, our providers are all part of one team.  This team includes your primary Cardiologist (physician) and Advanced Practice Providers or APPs (Physician Assistants and Nurse Practitioners) who all work together to provide you with the care you need, when you need it.  Your next appointment:   6 month(s)  Provider:   Lynwood Schilling, MD    We recommend signing up for the patient portal called MyChart.  Sign up information is provided on this After Visit Summary.  MyChart is used to connect with patients for Virtual Visits (Telemedicine).  Patients are able to view lab/test results, encounter notes, upcoming appointments, etc.  Non-urgent messages can be sent to your provider as well.   To learn more about what you can do with MyChart, go to [email or web address].

## 2024-01-25 ENCOUNTER — Ambulatory Visit: Payer: Self-pay | Admitting: Cardiology

## 2024-01-25 ENCOUNTER — Encounter: Payer: Self-pay | Admitting: Cardiology

## 2024-01-25 LAB — BASIC METABOLIC PANEL WITH GFR
BUN/Creatinine Ratio: 12 (ref 10–24)
BUN: 13 mg/dL (ref 8–27)
CO2: 22 mmol/L (ref 20–29)
Calcium: 9.6 mg/dL (ref 8.6–10.2)
Chloride: 100 mmol/L (ref 96–106)
Creatinine, Ser: 1.06 mg/dL (ref 0.76–1.27)
Glucose: 173 mg/dL — ABNORMAL HIGH (ref 70–99)
Potassium: 4.7 mmol/L (ref 3.5–5.2)
Sodium: 138 mmol/L (ref 134–144)
eGFR: 71 mL/min/1.73 (ref >59)

## 2024-02-19 MED ORDER — OLMESARTAN MEDOXOMIL 20 MG PO TABS: 20.0000 mg | ORAL_TABLET | Freq: Every day | ORAL | 3 refills | Status: DC

## 2024-03-03 ENCOUNTER — Encounter: Payer: Self-pay | Admitting: Nurse Practitioner

## 2024-03-03 ENCOUNTER — Ambulatory Visit (INDEPENDENT_AMBULATORY_CARE_PROVIDER_SITE_OTHER): Admitting: Nurse Practitioner

## 2024-03-03 VITALS — BP 130/78 | HR 73 | Ht 68.0 in | Wt 197.0 lb

## 2024-03-03 DIAGNOSIS — Z794 Long term (current) use of insulin: Secondary | ICD-10-CM | POA: Diagnosis not present

## 2024-03-03 DIAGNOSIS — E782 Mixed hyperlipidemia: Secondary | ICD-10-CM

## 2024-03-03 DIAGNOSIS — E1159 Type 2 diabetes mellitus with other circulatory complications: Secondary | ICD-10-CM

## 2024-03-03 DIAGNOSIS — I10 Essential (primary) hypertension: Secondary | ICD-10-CM | POA: Diagnosis not present

## 2024-03-03 DIAGNOSIS — Z7985 Long-term (current) use of injectable non-insulin antidiabetic drugs: Secondary | ICD-10-CM

## 2024-03-03 DIAGNOSIS — Z7984 Long term (current) use of oral hypoglycemic drugs: Secondary | ICD-10-CM

## 2024-03-03 MED ORDER — FREESTYLE LIBRE 3 READER DEVI: 1.0000 | Freq: Once | 0 refills | Status: AC

## 2024-03-03 MED ORDER — FREESTYLE LIBRE 3 PLUS SENSOR MISC: 3 refills | Status: AC

## 2024-03-03 NOTE — Progress Notes (Signed)
 03/03/2024, 6:03 PM     Endocrinology follow-up note   Subjective:    Patient ID: Marvin Frost, male    DOB: Nov 30, 1945.   Marvin Frost is being seen in follow-up after he was seen in consultation for management of currently uncontrolled symptomatic diabetes.  -He also has dyslipidemia, hypertension. PMD:   Zollie Lowers, MD.   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    Cataract    both removed   Cervical radiculopathy 09/14/2020   Diabetes mellitus 1995   Dysrhythmia    history of heart palpitations    Gastric polyps 09/18/2017   GERD (gastroesophageal reflux disease)    History of frequent urinary tract infections    History of hiatal hernia    Hyperlipemia    Hypertension    Obesity    Pancreatitis     Past Surgical History:  Procedure Laterality Date   CARDIAC CATHETERIZATION     2004 and 1999- done by Dr Lavona    COLONOSCOPY  11/2003   diverticulosis, no polyps   CORONARY PRESSURE/FFR STUDY N/A 05/28/2018   Procedure: INTRAVASCULAR PRESSURE WIRE/FFR STUDY;  Surgeon: Wonda Sharper, MD;  Location: [facility] INVASIVE CV LAB;  Service: Cardiovascular;  Laterality: N/A;   CORONARY STENT INTERVENTION N/A 05/28/2018   Procedure: CORONARY STENT INTERVENTION;  Surgeon: Wonda Sharper, MD;  Location: [facility] INVASIVE CV LAB;  Service: Cardiovascular;  Laterality: N/A;   EYE SURGERY Bilateral    cateracts - dr octavia   HYDROCELE EXCISION / REPAIR     JOINT REPLACEMENT  2017   both hips   left calf surgery      related to trauma of being torn    LEFT HEART CATH AND CORONARY ANGIOGRAPHY N/A 05/28/2018   Procedure: LEFT HEART CATH AND CORONARY ANGIOGRAPHY;  Surgeon: Wonda Sharper, MD;  Location: [facility] INVASIVE CV LAB;  Service: Cardiovascular;   Laterality: N/A;   left index finger arthrioscopic surgery      lymph node removed from right neck      right shoulder surgery     TOTAL HIP ARTHROPLASTY Right 03/16/2015   Procedure: RIGHT TOTAL HIP ARTHROPLASTY ANTERIOR APPROACH;  Surgeon: Donnice Car, MD;  Location: [facility] ORS;  Service: Orthopedics;  Laterality: Right;   TOTAL HIP ARTHROPLASTY Left 06/22/2015   Procedure: LEFT TOTAL HIP ARTHROPLASTY ANTERIOR APPROACH;  Surgeon: Donnice Car, MD;  Location: [facility];  Service: Orthopedics;  Laterality: Left;   UPPER GASTROINTESTINAL ENDOSCOPY     hiatal hernia 2cm, mild gastritis - 2012, multiple hyperplastic and fundic gland gastric polyps 2019    Social History   Socioeconomic History   Marital status: Married    Spouse name: Alisa   Number of children: 1   Years of education: Some College   Highest education level: Associate degree: occupational, Scientist, product/process development, or vocational program  Occupational History   Occupation: Event organiser:  [employer]    Comment: retired  Tobacco Use   Smoking status: Former    Current packs/day: 0.00    Average packs/day: 1 pack/day for 10.0 years (10.0 ttl pk-yrs)    Types: Cigarettes    Start date: 04/06/1974    Quit date: 04/06/1984    Years since quitting: 39.9   Smokeless tobacco: Former    Types: Chew    Quit date: 06/26/1978   Tobacco comments:    I do not smoke  Vaping Use   Vaping status: Never Used  Substance and Sexual Activity   Alcohol use: No   Drug use: No   Sexual activity: Yes    Birth control/protection: None  Other Topics Concern   Not on file  Social History Narrative   Married   Retired from [employer]   1 child   Former but no current tobacco   No EtOH/drugs   Social Drivers of Corporate investment banker Strain: Low Risk  (11/23/2023)   Overall Financial Resource Strain (CARDIA)    Difficulty of Paying Living Expenses: Not hard at all  Food Insecurity: No Food Insecurity (11/23/2023)   Hunger Vital  Sign    Worried About Running Out of Food in the Last Year: Never true    Ran Out of Food in the Last Year: Never true  Transportation Needs: No Transportation Needs (11/23/2023)   PRAPARE - Administrator, Civil Service (Medical): No    Lack of Transportation (Non-Medical): No  Physical Activity: Inactive (11/23/2023)   Exercise Vital Sign    Days of Exercise per Week: 0 days    Minutes of Exercise per Session: 60 min  Stress: No Stress Concern Present (11/23/2023)   [employer] of Occupational Health - Occupational Stress Questionnaire    Feeling of Stress : Not at all  Social Connections: Moderately Integrated (11/23/2023)   Social Connection and Isolation Panel    Frequency of Communication with Friends and Family: Once a week    Frequency of Social Gatherings with Friends and Family: Once a week    Attends Religious Services: More than 4 times per year    Active Member of [employer] or Organizations: Yes    Attends Engineer, structural: More than 4 times per year    Marital Status: Married    Family History  Problem Relation Age of Onset   Heart disease Mother    Heart attack Mother    Diabetes Brother    Heart disease Brother    Heart attack Brother 61   Heart attack Father 26   Early death Father    Heart disease Father    GI problems Sister        torn esophagus during endoscopy   Healthy Daughter    Colon polyps Neg Hx    Esophageal cancer Neg Hx    Rectal cancer Neg Hx    Stomach cancer Neg Hx    Colon cancer Neg Hx     Outpatient Encounter Medications as of 03/03/2024  Medication Sig   aspirin  EC 81 MG EC tablet Take 1 tablet (81 mg total) by mouth daily.   cetirizine (ZYRTEC) 10 MG tablet Take 10 mg by mouth as needed for allergies or rhinitis.    Continuous Glucose Receiver (FREESTYLE LIBRE 3 READER) DEVI 1 Device by Does not apply route once for 1 dose.   Continuous Glucose Sensor (FREESTYLE LIBRE 3 PLUS SENSOR) MISC Change sensor every 15  days.  Continuous Glucose Sensor (FREESTYLE LIBRE 3 PLUS SENSOR) MISC Change sensor every 15 days.   Dulaglutide  (TRULICITY ) 4.5 MG/0.5ML SOAJ Inject 4.5 mg as directed once a week.   ferrous sulfate  325 (65 FE) MG tablet Take 1 tablet (325 mg total) by mouth every other day. (Patient taking differently: Take 325 mg by mouth every morning.)   fluocinolone  (SYNALAR ) 0.01 % external solution Apply topically 2 (two) times daily. To scalp   fluticasone  (FLONASE ) 50 MCG/ACT nasal spray Place 2 sprays into both nostrils daily.   glucose blood (FREESTYLE LITE) test strip Use as instructed to monitor glucose 2 times daily   hydrochlorothiazide  (HYDRODIURIL ) 12.5 MG tablet Take 1 tablet (12.5 mg total) by mouth daily.   insulin  glargine (LANTUS ) 100 UNIT/ML injection Inject 0.8 mLs (80 Units total) into the skin at bedtime.   Insulin  Pen Needle (NOVOTWIST) 32G X 5 MM MISC USE WITH INSULIN  PENS UP TO 5 TIMES DAILY Dx E11.59   Insulin  Pen Needle 32G X 5 MM MISC Use to inject insulin  1 time daily   Lancets (FREESTYLE) lancets Check BS BID and PRN. DX.E11.9   metFORMIN  (GLUCOPHAGE -XR) 500 MG 24 hr tablet Take 2 tablets (1,000 mg total) by mouth daily with breakfast.   metoprolol  succinate (TOPROL -XL) 100 MG 24 hr tablet TAKE 1 TABLET DAILY FOR BLOOD PRESSURE CONTROL   nitroGLYCERIN  (NITROSTAT ) 0.4 MG SL tablet DISSOLVE 1 TABLET UNDER THE TONGUE EVERY 5 MINUTES AS NEEDED FOR CHEST PAIN   olmesartan  (BENICAR ) 20 MG tablet Take 1 tablet (20 mg total) by mouth daily.   pantoprazole  (PROTONIX ) 40 MG tablet TAKE 1 TABLET DAILY   REPATHA  SURECLICK 140 MG/ML SOAJ INJECT 140 MG UNDER THE SKIN EVERY 14 DAYS   rosuvastatin  (CRESTOR ) 40 MG tablet Take 1 tablet (40 mg total) by mouth every 3 (three) days.   No facility-administered encounter medications on file as of 03/03/2024.    ALLERGIES: Allergies  Allergen Reactions   Brilinta  [Ticagrelor ]     Rash and SOB    Liraglutide Other (See Comments)    Abdominal  pain and enlarged pancreas on CT ? pancreatitis   Baxinets [Cetylpyridinium-Benzocaine] Other (See Comments)    Unknown reaction   Celebrex [Celecoxib] Nausea Only   Toprol  Xl [Metoprolol  Succinate] Other (See Comments)    Pt. Has not had any issues with this for many years    Triplex Ad Other (See Comments)    Abdominal pain and enlarged pancreas on CT? pancreatitis   Welchol [Colesevelam Hcl] Other (See Comments)    constipation   Norvasc [Amlodipine Besylate] Rash   Statins Other (See Comments)    muscle and joint pain if taken for long periods of time, takes Crestor  every 2-3 days to avoid this reaction    VACCINATION STATUS: Immunization History  Administered Date(s) Administered   Fluad Quad(high Dose 65+) 03/26/2019, 04/19/2020, 05/12/2021, 03/30/2022   Fluad Trivalent(High Dose 65+) 04/04/2023   INFLUENZA, HIGH DOSE SEASONAL PF 04/15/2015, 04/12/2016, 04/05/2017, 04/16/2018   Influenza Whole 02/24/2010   Influenza,inj,Quad PF,6+ Mos 03/26/2013, 03/30/2014   Moderna Sars-Covid-2 Vaccination 07/31/2019, 08/28/2019, 03/15/2020   Pneumococcal Conjugate-13 06/17/2013   Pneumococcal Polysaccharide-23 02/24/2009   Td 02/24/2006   Tdap 04/27/2011   Zoster Recombinant(Shingrix ) 07/22/2018   Zoster, Live 07/27/2006    Diabetes He presents for his follow-up diabetic visit. He has type 2 diabetes mellitus. Onset time: He was diagnosed at approximate age of 48 years. His disease course has been improving. There are no hypoglycemic associated symptoms. Pertinent negatives  for hypoglycemia include no confusion, pallor or seizures. Pertinent negatives for diabetes include no fatigue, no polydipsia, no polyphagia, no polyuria, no weakness and no weight loss. There are no hypoglycemic complications. Symptoms are stable. Diabetic complications include heart disease and nephropathy. Risk factors for coronary artery disease include diabetes mellitus, dyslipidemia, family history, male sex,  hypertension, sedentary lifestyle, tobacco exposure and obesity. Current diabetic treatment includes oral agent (monotherapy) and insulin  injections (Trulicity ). He is compliant with treatment most of the time. His weight is fluctuating minimally. He is following a generally healthy diet. When asked about meal planning, he reported none. He has not had a previous visit with a dietitian. He participates in exercise intermittently. His home blood glucose trend is decreasing steadily. His breakfast blood glucose range is generally 90-110 mg/dl. His lunch blood glucose range is generally 130-140 mg/dl. His dinner blood glucose range is generally 180-200 mg/dl. His bedtime blood glucose range is generally 180-200 mg/dl. His overall blood glucose range is 140-180 mg/dl. (He presents today with his CGM showing improving glycemic profile with tight fasting and above target postprandial readings.  His POCT A1c today is 7.9%, improving from last visit of 9.3%.  Analysis of his CGM shows TIR 44%, TAR 55%, TBR 1%, with a GMI of 7.8%.  He notes he has taken less Lantus  on several occasions due to fasting hypoglycemia.) An ACE inhibitor/angiotensin II receptor blocker is being taken. He does not see a podiatrist.Eye exam is current.    Review of systems  Constitutional: + Minimally fluctuating body weight,  current Body mass index is 29.95 kg/m. , no fatigue, no subjective hyperthermia, no subjective hypothermia Eyes: no blurry vision, no xerophthalmia ENT: no sore throat, no nodules palpated in throat, no dysphagia/odynophagia, no hoarseness Cardiovascular: no chest pain, no shortness of breath, no palpitations, no leg swelling Respiratory: no cough, no shortness of breath Gastrointestinal: no nausea/vomiting/diarrhea Musculoskeletal: no muscle/joint aches Skin: no rashes, no hyperemia Neurological: no tremors, no numbness, no tingling, no dizziness Psychiatric: no depression, no anxiety   Objective:    BP  130/78 (BP Location: Right Arm, Patient Position: Sitting, Cuff Size: Large)   Pulse 73   Ht 5' 8 (1.727 m)   Wt 197 lb (89.4 kg)   BMI 29.95 kg/m   Wt Readings from Last 3 Encounters:  03/03/24 197 lb (89.4 kg)  01/24/24 193 lb (87.5 kg)  12/24/23 194 lb 9.6 oz (88.3 kg)    BP Readings from Last 3 Encounters:  03/03/24 130/78  01/24/24 130/69  12/24/23 102/64     Physical Exam- Limited  Constitutional:  Body mass index is 29.95 kg/m. , not in acute distress, normal state of mind Eyes:  EOMI, no exophthalmos Musculoskeletal: no gross deformities, strength intact in all four extremities, no gross restriction of joint movements Skin:  no rashes, no hyperemia Neurological: no tremor with outstretched hands   Diabetic Foot Exam - Simple   No data filed    CMP     Component Value Date/Time   NA 138 01/24/2024 1147   K 4.7 01/24/2024 1147   CL 100 01/24/2024 1147   CO2 22 01/24/2024 1147   GLUCOSE 173 (H) 01/24/2024 1147   GLUCOSE 154 (H) 05/29/2018 0217   BUN 13 01/24/2024 1147   CREATININE 1.06 01/24/2024 1147   CREATININE 0.95 12/31/2012 0857   CALCIUM  9.6 01/24/2024 1147   PROT 6.8 11/26/2023 0943   ALBUMIN 4.3 11/26/2023 0943   AST 15 11/26/2023 0943   ALT 15  11/26/2023 0943   ALKPHOS 70 11/26/2023 0943   BILITOT 0.4 11/26/2023 0943   GFRNONAA 68 07/07/2020 1028   GFRNONAA 81 12/31/2012 0857   GFRAA 78 07/07/2020 1028   GFRAA >89 12/31/2012 0857    Diabetic Labs (most recent): Lab Results  Component Value Date   HGBA1C 9.3 (A) 11/01/2023   HGBA1C 8.7 (A) 07/03/2023   HGBA1C 8.8 (A) 12/20/2022   MICROALBUR 30 10/04/2021   MICROALBUR 10 12/30/2020   MICROALBUR 20 01/26/2015     Lipid Panel ( most recent) Lipid Panel     Component Value Date/Time   CHOL 95 (L) 11/26/2023 0943   CHOL 157 12/31/2012 0857   TRIG 116 11/26/2023 0943   TRIG 271 (H) 02/17/2016 0824   TRIG 184 (H) 12/31/2012 0857   HDL 43 11/26/2023 0943   HDL 44 02/17/2016 0824    HDL 45 12/31/2012 0857   CHOLHDL 2.2 11/26/2023 0943   LDLCALC 31 11/26/2023 0943   LDLCALC 52 01/19/2014 1007   LDLCALC 75 12/31/2012 0857   LDLDIRECT 39 11/02/2016 0806      Lab Results  Component Value Date   TSH 3.350 07/07/2020   TSH 2.720 12/30/2019   TSH 2.290 09/03/2014   FREET4 1.42 07/07/2020     Assessment & Plan:   1) DM type 2 causing vascular disease (HCC)  -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  - Marvin Frost has currently uncontrolled symptomatic type 2 DM since  78 years of age.  He presents today with his CGM showing improving glycemic profile with tight fasting and above target postprandial readings.  His POCT A1c today is 7.9%, improving from last visit of 9.3%.  Analysis of his CGM shows TIR 44%, TAR 55%, TBR 1%, with a GMI of 7.8%.  He notes he has taken less Lantus  on several occasions due to fasting hypoglycemia.  -I have reviewed his recent labs with him.  - I had a long discussion with him about the progressive nature of diabetes and the pathology behind its complications. -his diabetes is complicated by coronary artery disease, obesity and he remains at a high risk for more acute and chronic complications which include CAD, CVA, CKD, retinopathy, and neuropathy. These are all discussed in detail with him.  The following Lifestyle Medicine recommendations according to American College of Lifestyle Medicine [facility]) were discussed and offered to patient and he agrees to start the journey:  A. Whole Foods, Plant-based plate comprising of fruits and vegetables, plant-based proteins, whole-grain carbohydrates was discussed in detail with the patient.   A list for source of those nutrients were also provided to the patient.  Patient will use only water  or unsweetened tea for hydration. B.  The need to stay away from risky substances including alcohol, smoking; obtaining 7 to 9 hours of restorative sleep, at least 150 minutes  of moderate intensity exercise weekly, the importance of healthy social connections,  and stress reduction techniques were discussed. C.  A full color page of  Calorie density of various food groups per pound showing examples of each food groups was provided to the patient.  - Nutritional counseling repeated at each appointment due to patients tendency to fall back in to old habits.  - The patient admits there is a room for improvement in their diet and drink choices. -  Suggestion is made for the patient to avoid simple carbohydrates from their diet including Cakes, Sweet Desserts / Pastries, Ice Cream, Soda (  diet and regular), Sweet Tea, Candies, Chips, Cookies, Sweet Pastries, Store Bought Juices, Alcohol in Excess of 1-2 drinks a day, Artificial Sweeteners, Coffee Creamer, and Sugar-free Products. This will help patient to have stable blood glucose profile and potentially avoid unintended weight gain.   - I encouraged the patient to switch to unprocessed or minimally processed complex starch and increased protein intake (animal or plant source), fruits, and vegetables.   - Patient is advised to stick to a routine mealtimes to eat 3 meals a day and avoid unnecessary snacks (to snack only to correct hypoglycemia).  - I have approached him with the following individualized plan to manage diabetes and patient agrees:   -He is advised to lower his Lantus  to 70 units SQ nightly, continue Metformin  1000 mg ER daily with breakfast, and Trulicity  4.5 mg SQ weekly.  He has made great progress thus far.  -He is encouraged to continue monitoring blood glucose using his CGM at least 4 times per day, before meals and at bedtime and call the clinic if he gets readings less than 70 or greater than 200 for 3 tests in a row.  I did send in upgrade to [place] 3 plus to ADS.  - Patient specific target  A1c;  LDL, HDL, Triglycerides,  were discussed in detail.  2) Blood Pressure /Hypertension:   His blood  pressure is controlled to target.  He is advised to continue meds as prescribed by PCP/cardiology.    3) Lipids/Hyperlipidemia: His most recent lipid panel from 11/26/23 shows controlled LDL of 31. He is statin intolerant with myalgias.  He has tolerated taking his Crestor  every 2-3 days.  He is advised to continue taking his Crestor  40 mg po every 3 days or so and continue his Repatha  140 mg SQ every 2 weeks.  Side effects and precautions discussed with him.    4)  Weight/Diet:  His Body mass index is 29.95 kg/m.--   He has lost some weight over the last few visits and will benefit from continued weight loss.  I discussed with him the fact that loss of 5 - 10% of his  current body weight will have the most impact on his diabetes management.  CDE Consult will be initiated . Exercise, and detailed carbohydrates information provided  -  detailed on discharge instructions.  5) Chronic Care/Health Maintenance: -he is on ACEI/ARB and Statin medications and is encouraged to initiate and continue to follow up with Ophthalmology, Dentist, Podiatrist at least yearly or according to recommendations, and advised to  stay away from smoking. I have recommended yearly flu vaccine and pneumonia vaccine at least every 5 years; moderate intensity exercise for up to 150 minutes weekly; and  sleep for at least 7 hours a day.  - he is advised to maintain close follow up with Zollie Lowers, MD for primary care needs, as well as his other providers for optimal and coordinated care.     I spent  41  minutes in the care of the patient today including review of labs from CMP, Lipids, Thyroid  Function, Hematology (current and previous including abstractions from other facilities); face-to-face time discussing  his blood glucose readings/logs, discussing hypoglycemia and hyperglycemia episodes and symptoms, medications doses, his options of short and long term treatment based on the latest standards of care / guidelines;   discussion about incorporating lifestyle medicine;  and documenting the encounter. Risk reduction counseling performed per USPSTF guidelines to reduce obesity and cardiovascular risk factors.  Please refer to Patient Instructions for Blood Glucose Monitoring and Insulin /Medications Dosing Guide  in media tab for additional information. Please  also refer to  Patient Self Inventory in the Media  tab for reviewed elements of pertinent patient history.  Marvin Frost participated in the discussions, expressed understanding, and voiced agreement with the above plans.  All questions were answered to his satisfaction. he is encouraged to contact clinic should he have any questions or concerns prior to his return visit.    Follow up plan: - Return in about 4 months (around 07/03/2024) for Diabetes F/U with A1c in office, No previsit labs, Bring meter and logs.   Marvin Frost, [facility] [address] [place], [place] [address] Phone: [phone] Fax: [phone]  03/03/2024, 6:03 PM

## 2024-03-06 DIAGNOSIS — I959 Hypotension, unspecified: Secondary | ICD-10-CM | POA: Diagnosis not present

## 2024-03-06 DIAGNOSIS — I10 Essential (primary) hypertension: Secondary | ICD-10-CM | POA: Diagnosis not present

## 2024-03-07 ENCOUNTER — Ambulatory Visit: Payer: Self-pay | Admitting: Cardiology

## 2024-03-07 LAB — BASIC METABOLIC PANEL WITH GFR
BUN/Creatinine Ratio: 14 (ref 10–24)
BUN: 13 mg/dL (ref 8–27)
CO2: 22 mmol/L (ref 20–29)
Calcium: 9.4 mg/dL (ref 8.6–10.2)
Chloride: 97 mmol/L (ref 96–106)
Creatinine, Ser: 0.93 mg/dL (ref 0.76–1.27)
Glucose: 201 mg/dL — ABNORMAL HIGH (ref 70–99)
Potassium: 4 mmol/L (ref 3.5–5.2)
Sodium: 136 mmol/L (ref 134–144)
eGFR: 83 mL/min/1.73 (ref >59)

## 2024-04-01 ENCOUNTER — Ambulatory Visit (INDEPENDENT_AMBULATORY_CARE_PROVIDER_SITE_OTHER)

## 2024-04-01 DIAGNOSIS — Z23 Encounter for immunization: Secondary | ICD-10-CM | POA: Diagnosis not present

## 2024-04-04 ENCOUNTER — Other Ambulatory Visit: Payer: Self-pay

## 2024-04-04 DIAGNOSIS — I959 Hypotension, unspecified: Secondary | ICD-10-CM

## 2024-04-04 DIAGNOSIS — I10 Essential (primary) hypertension: Secondary | ICD-10-CM

## 2024-04-09 ENCOUNTER — Other Ambulatory Visit: Payer: Self-pay | Admitting: Cardiology

## 2024-04-09 DIAGNOSIS — I959 Hypotension, unspecified: Secondary | ICD-10-CM

## 2024-04-09 DIAGNOSIS — I10 Essential (primary) hypertension: Secondary | ICD-10-CM

## 2024-04-15 ENCOUNTER — Other Ambulatory Visit: Payer: Self-pay

## 2024-04-15 DIAGNOSIS — I959 Hypotension, unspecified: Secondary | ICD-10-CM

## 2024-04-15 DIAGNOSIS — I10 Essential (primary) hypertension: Secondary | ICD-10-CM

## 2024-04-15 MED ORDER — OLMESARTAN MEDOXOMIL 20 MG PO TABS: 20.0000 mg | ORAL_TABLET | Freq: Every day | ORAL | 2 refills | Status: AC

## 2024-05-20 DIAGNOSIS — H04123 Dry eye syndrome of bilateral lacrimal glands: Secondary | ICD-10-CM | POA: Diagnosis not present

## 2024-05-20 DIAGNOSIS — Z794 Long term (current) use of insulin: Secondary | ICD-10-CM | POA: Diagnosis not present

## 2024-05-20 DIAGNOSIS — Z961 Presence of intraocular lens: Secondary | ICD-10-CM | POA: Diagnosis not present

## 2024-05-20 DIAGNOSIS — E119 Type 2 diabetes mellitus without complications: Secondary | ICD-10-CM | POA: Diagnosis not present

## 2024-05-20 LAB — OPHTHALMOLOGY REPORT-SCANNED

## 2024-05-21 ENCOUNTER — Encounter: Payer: Self-pay | Admitting: Nurse Practitioner

## 2024-05-29 ENCOUNTER — Encounter: Payer: Self-pay | Admitting: Family Medicine

## 2024-06-11 ENCOUNTER — Other Ambulatory Visit: Payer: Self-pay | Admitting: Family Medicine

## 2024-06-17 ENCOUNTER — Encounter: Payer: Self-pay | Admitting: Cardiology

## 2024-07-03 ENCOUNTER — Encounter: Payer: Self-pay | Admitting: Nurse Practitioner

## 2024-07-03 ENCOUNTER — Ambulatory Visit (INDEPENDENT_AMBULATORY_CARE_PROVIDER_SITE_OTHER): Admitting: Nurse Practitioner

## 2024-07-03 VITALS — BP 114/78 | HR 78 | Ht 68.0 in | Wt 197.4 lb

## 2024-07-03 DIAGNOSIS — E1159 Type 2 diabetes mellitus with other circulatory complications: Secondary | ICD-10-CM | POA: Diagnosis not present

## 2024-07-03 DIAGNOSIS — Z794 Long term (current) use of insulin: Secondary | ICD-10-CM

## 2024-07-03 DIAGNOSIS — I10 Essential (primary) hypertension: Secondary | ICD-10-CM

## 2024-07-03 DIAGNOSIS — Z7985 Long-term (current) use of injectable non-insulin antidiabetic drugs: Secondary | ICD-10-CM

## 2024-07-03 DIAGNOSIS — E782 Mixed hyperlipidemia: Secondary | ICD-10-CM

## 2024-07-03 DIAGNOSIS — Z7984 Long term (current) use of oral hypoglycemic drugs: Secondary | ICD-10-CM | POA: Diagnosis not present

## 2024-07-03 MED ORDER — TIRZEPATIDE 7.5 MG/0.5ML ~~LOC~~ SOAJ: 7.5000 mg | SUBCUTANEOUS | 1 refills | Status: AC

## 2024-07-03 MED ORDER — METFORMIN HCL ER 500 MG PO TB24: 1000.0000 mg | ORAL_TABLET | Freq: Every day | ORAL | 3 refills | Status: AC

## 2024-07-03 NOTE — Progress Notes (Signed)
 "                                                                                     07/03/2024, 2:41 PM     Endocrinology follow-up note   Subjective:    Patient ID: Marvin Frost, male    DOB: [birth year].   Marvin Frost is being seen in follow-up after he was seen in consultation for management of currently uncontrolled symptomatic diabetes.  -He also has dyslipidemia, hypertension. PMD:   Zollie Lowers, MD.   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    Cataract    both removed   Cervical radiculopathy 09/14/2020   Diabetes mellitus 1995   Dysrhythmia    history of heart palpitations    Gastric polyps 09/18/2017   GERD (gastroesophageal reflux disease)    History of frequent urinary tract infections    History of hiatal hernia    Hyperlipemia    Hypertension    Obesity    Pancreatitis     Past Surgical History:  Procedure Laterality Date   CARDIAC CATHETERIZATION     2004 and 1999- done by Dr Lavona    COLONOSCOPY  11/2003   diverticulosis, no polyps   CORONARY PRESSURE/FFR STUDY N/A 05/28/2018   Procedure: INTRAVASCULAR PRESSURE WIRE/FFR STUDY;  Surgeon: Wonda Sharper, MD;  Location: [facility] INVASIVE CV LAB;  Service: Cardiovascular;  Laterality: N/A;   CORONARY STENT INTERVENTION N/A 05/28/2018   Procedure: CORONARY STENT INTERVENTION;  Surgeon: Wonda Sharper, MD;  Location: [facility] INVASIVE CV LAB;  Service: Cardiovascular;  Laterality: N/A;   EYE SURGERY Bilateral    cateracts - dr octavia   HYDROCELE EXCISION / REPAIR     JOINT REPLACEMENT  2017   both hips   left calf surgery      related to trauma of being torn    LEFT HEART CATH AND CORONARY ANGIOGRAPHY N/A 05/28/2018   Procedure: LEFT HEART CATH AND CORONARY ANGIOGRAPHY;  Surgeon: Wonda Sharper, MD;  Location: [facility] INVASIVE CV LAB;  Service: Cardiovascular;   Laterality: N/A;   left index finger arthrioscopic surgery      lymph node removed from right neck      right shoulder surgery     TOTAL HIP ARTHROPLASTY Right 03/16/2015   Procedure: RIGHT TOTAL HIP ARTHROPLASTY ANTERIOR APPROACH;  Surgeon: Donnice Car, MD;  Location: [facility];  Service: Orthopedics;  Laterality: Right;   TOTAL HIP ARTHROPLASTY Left 06/22/2015   Procedure: LEFT TOTAL HIP ARTHROPLASTY ANTERIOR APPROACH;  Surgeon: Donnice Car, MD;  Location: [facility];  Service: Orthopedics;  Laterality: Left;   UPPER GASTROINTESTINAL ENDOSCOPY     hiatal hernia 2cm, mild gastritis - 2012, multiple hyperplastic and fundic gland gastric polyps 2019    Social History   Socioeconomic History   Marital status: Married    Spouse name: Marvin Frost   Number of children: 1   Years of education: Some [employer] education  level: Associate degree: occupational, scientist, product/process development, or vocational program  Occupational History   Occupation: Event Organiser: [employer]    Comment: retired  Tobacco Use   Smoking status: Former    Current packs/day: 0.00    Average packs/day: 1 pack/day for 10.0 years (10.0 ttl pk-yrs)    Types: Cigarettes    Start date: 04/06/1974    Quit date: 04/06/1984    Years since quitting: 40.2   Smokeless tobacco: Former    Types: Chew    Quit date: 06/26/1978   Tobacco comments:    I do not smoke  Vaping Use   Vaping status: Never Used  Substance and Sexual Activity   Alcohol use: No   Drug use: No   Sexual activity: Yes    Birth control/protection: None  Other Topics Concern   Not on file  Social History Narrative   Married   Retired from [employer]   1 child   Former but no current tobacco   No EtOH/drugs   Social Drivers of Health   Tobacco Use: Medium Risk (07/03/2024)   Patient History    Smoking Tobacco Use: Former    Smokeless Tobacco Use: Former    Passive Exposure: Not on Actuary Strain: Low Risk (11/23/2023)   Overall  Financial Resource Strain (CARDIA)    Difficulty of Paying Living Expenses: Not hard at all  Food Insecurity: No Food Insecurity (11/23/2023)   Hunger Vital Sign    Worried About Running Out of Food in the Last Year: Never true    Ran Out of Food in the Last Year: Never true  Transportation Needs: No Transportation Needs (11/23/2023)   PRAPARE - Administrator, Civil Service (Medical): No    Lack of Transportation (Non-Medical): No  Physical Activity: Inactive (11/23/2023)   Exercise Vital Sign    Days of Exercise per Week: 0 days    Minutes of Exercise per Session: 60 min  Stress: No Stress Concern Present (11/23/2023)   [employer] of Occupational Health - Occupational Stress Questionnaire    Feeling of Stress : Not at all  Social Connections: Moderately Integrated (11/23/2023)   Social Connection and Isolation Panel    Frequency of Communication with Friends and Family: Once a week    Frequency of Social Gatherings with Friends and Family: Once a week    Attends Religious Services: More than 4 times per year    Active Member of Clubs or Organizations: Yes    Attends Banker Meetings: More than 4 times per year    Marital Status: Married  Depression (PHQ2-9): Low Risk (11/26/2023)   Depression (PHQ2-9)    PHQ-2 Score: 0  Alcohol Screen: Low Risk (08/06/2023)   Alcohol Screen    Last Alcohol Screening Score (AUDIT): 0  Housing: Low Risk (11/23/2023)   Housing Stability Vital Sign    Unable to Pay for Housing in the Last Year: No    Number of Times Moved in the Last Year: 0    Homeless in the Last Year: No  Utilities: Not At Risk (08/06/2023)   AHC Utilities    Threatened with loss of utilities: No  Health Literacy: Adequate Health Literacy (08/06/2023)   B1300 Health Literacy    Frequency of need for help with medical instructions: Never    Family History  Problem Relation Age of Onset   Heart disease Mother    Heart attack Mother    Diabetes  Brother    Heart disease Brother    Heart attack Brother 38   Heart attack Father 58   Early death Father    Heart disease Father    GI problems Sister        torn esophagus during endoscopy   Healthy Daughter    Colon polyps Neg Hx    Esophageal cancer Neg Hx    Rectal cancer Neg Hx    Stomach cancer Neg Hx    Colon cancer Neg Hx     Outpatient Encounter Medications as of 07/03/2024  Medication Sig   aspirin  EC 81 MG EC tablet Take 1 tablet (81 mg total) by mouth daily.   cetirizine (ZYRTEC) 10 MG tablet Take 10 mg by mouth as needed for allergies or rhinitis.    Continuous Glucose Sensor (FREESTYLE LIBRE 3 PLUS SENSOR) MISC Change sensor every 15 days.   Continuous Glucose Sensor (FREESTYLE LIBRE 3 PLUS SENSOR) MISC Change sensor every 15 days.   ferrous sulfate  325 (65 FE) MG tablet Take 1 tablet (325 mg total) by mouth every other day. (Patient taking differently: Take 325 mg by mouth every morning. Patient states that he is taking every day)   fluticasone  (FLONASE ) 50 MCG/ACT nasal spray Place 2 sprays into both nostrils daily.   hydrochlorothiazide  (HYDRODIURIL ) 12.5 MG tablet Take 1 tablet (12.5 mg total) by mouth daily.   insulin  glargine (LANTUS ) 100 UNIT/ML injection Inject 0.8 mLs (80 Units total) into the skin at bedtime. (Patient taking differently: Inject 70 Units into the skin at bedtime. Patient states that he  has a few times has injected 80 units.)   Insulin  Pen Needle (NOVOTWIST) 32G X 5 MM MISC USE WITH INSULIN  PENS UP TO 5 TIMES DAILY Dx E11.59   Insulin  Pen Needle 32G X 5 MM MISC Use to inject insulin  1 time daily   Lancets (FREESTYLE) lancets Check BS BID and PRN. DX.E11.9   metoprolol  succinate (TOPROL -XL) 100 MG 24 hr tablet TAKE 1 TABLET DAILY FOR BLOOD PRESSURE CONTROL   nitroGLYCERIN  (NITROSTAT ) 0.4 MG SL tablet DISSOLVE 1 TABLET UNDER THE TONGUE EVERY 5 MINUTES AS NEEDED FOR CHEST PAIN   olmesartan  (BENICAR ) 20 MG tablet Take 1 tablet (20 mg total) by mouth  daily.   pantoprazole  (PROTONIX ) 40 MG tablet TAKE 1 TABLET DAILY   REPATHA  SURECLICK 140 MG/ML SOAJ INJECT 140 MG UNDER THE SKIN EVERY 14 DAYS   rosuvastatin  (CRESTOR ) 40 MG tablet Take 1 tablet (40 mg total) by mouth every 3 (three) days.   tirzepatide  (MOUNJARO ) 7.5 MG/0.5ML Pen Inject 7.5 mg into the skin once a week.   [DISCONTINUED] Dulaglutide  (TRULICITY ) 4.5 MG/0.5ML SOAJ Inject 4.5 mg as directed once a week.   [DISCONTINUED] metFORMIN  (GLUCOPHAGE -XR) 500 MG 24 hr tablet Take 2 tablets (1,000 mg total) by mouth daily with breakfast.   metFORMIN  (GLUCOPHAGE -XR) 500 MG 24 hr tablet Take 2 tablets (1,000 mg total) by mouth daily with breakfast.   [DISCONTINUED] fluocinolone  (SYNALAR ) 0.01 % external solution Apply topically 2 (two) times daily. To scalp (Patient not taking: Reported on 07/03/2024)   [DISCONTINUED] glucose blood (FREESTYLE LITE) test strip Use as instructed to monitor glucose 2 times daily (Patient not taking: Reported on 07/03/2024)   No facility-administered encounter medications on file as of 07/03/2024.    ALLERGIES: Allergies  Allergen Reactions   Brilinta  [Ticagrelor ]     Rash and SOB    Liraglutide Other (See Comments)    Abdominal pain and enlarged pancreas on CT ?  pancreatitis   Baxinets [Cetylpyridinium-Benzocaine] Other (See Comments)    Unknown reaction   Celebrex [Celecoxib] Nausea Only   Toprol  Xl [Metoprolol  Succinate] Other (See Comments)    Pt. Has not had any issues with this for many years    Triplex Ad Other (See Comments)    Abdominal pain and enlarged pancreas on CT? pancreatitis   Welchol [Colesevelam Hcl] Other (See Comments)    constipation   Norvasc [Amlodipine Besylate] Rash   Statins Other (See Comments)    muscle and joint pain if taken for long periods of time, takes Crestor  every 2-3 days to avoid this reaction    VACCINATION STATUS: Immunization History  Administered Date(s) Administered   Fluad Quad(high Dose 65+) 03/26/2019,  04/19/2020, 05/12/2021, 03/30/2022   Fluad Trivalent(High Dose 65+) 04/04/2023   INFLUENZA, HIGH DOSE SEASONAL PF 04/15/2015, 04/12/2016, 04/05/2017, 04/16/2018, 04/01/2024   Influenza Whole 02/24/2010   Influenza,inj,Quad PF,6+ Mos 03/26/2013, 03/30/2014   Moderna Sars-Covid-2 Vaccination 07/31/2019, 08/28/2019, 03/15/2020   Pneumococcal Conjugate-13 06/17/2013   Pneumococcal Polysaccharide-23 02/24/2009   Td 02/24/2006   Tdap 04/27/2011   Zoster Recombinant(Shingrix ) 07/22/2018   Zoster, Live 07/27/2006    Diabetes He presents for his follow-up diabetic visit. He has type 2 diabetes mellitus. Onset time: He was diagnosed at approximate age of 48 years. His disease course has been worsening. There are no hypoglycemic associated symptoms. Pertinent negatives for hypoglycemia include no confusion, pallor or seizures. Pertinent negatives for diabetes include no fatigue, no polydipsia, no polyphagia, no polyuria, no weakness and no weight loss. There are no hypoglycemic complications. Symptoms are stable. Diabetic complications include heart disease and nephropathy. Risk factors for coronary artery disease include diabetes mellitus, dyslipidemia, family history, male sex, hypertension, sedentary lifestyle, tobacco exposure and obesity. Current diabetic treatment includes oral agent (monotherapy) and insulin  injections (Trulicity ). He is compliant with treatment most of the time. His weight is fluctuating minimally. He is following a generally healthy diet. When asked about meal planning, he reported none. He has not had a previous visit with a dietitian. He participates in exercise intermittently. His home blood glucose trend is increasing steadily. His breakfast blood glucose range is generally 140-180 mg/dl. His lunch blood glucose range is generally >200 mg/dl. His dinner blood glucose range is generally >200 mg/dl. His bedtime blood glucose range is generally >200 mg/dl. His overall blood glucose  range is >200 mg/dl. (He presents today with his CGM and logs showing above target glycemic profile overall.  His POCT A1c today is 9%, increasing from last visit of 7.9%.  He admits he has not done the best eating healthy over the holiday season.  He denies any recent infections.  He wonders if his Trulicity  is no longer effective for him.  He denies any hypoglycemia.  On some occasions when glucose is higher, he will increase his Lantus  to 80 units.) An ACE inhibitor/angiotensin II receptor blocker is being taken. He does not see a podiatrist.Eye exam is current.    Review of systems  Constitutional: + Minimally fluctuating body weight,  current Body mass index is 30.01 kg/m. , no fatigue, no subjective hyperthermia, no subjective hypothermia Eyes: no blurry vision, no xerophthalmia ENT: no sore throat, no nodules palpated in throat, no dysphagia/odynophagia, no hoarseness Cardiovascular: no chest pain, no shortness of breath, no palpitations, no leg swelling Respiratory: no cough, no shortness of breath Gastrointestinal: no nausea/vomiting/diarrhea Musculoskeletal: no muscle/joint aches Skin: no rashes, no hyperemia Neurological: no tremors, no numbness, no tingling, no dizziness Psychiatric:  no depression, no anxiety   Objective:    BP 114/78 (BP Location: Left Arm, Patient Position: Sitting, Cuff Size: Large)   Pulse 78   Ht 5' 8 (1.727 m)   Wt 197 lb 6.4 oz (89.5 kg)   BMI 30.01 kg/m   Wt Readings from Last 3 Encounters:  07/03/24 197 lb 6.4 oz (89.5 kg)  03/03/24 197 lb (89.4 kg)  01/24/24 193 lb (87.5 kg)    BP Readings from Last 3 Encounters:  07/03/24 114/78  03/03/24 130/78  01/24/24 130/69     Physical Exam- Limited  Constitutional:  Body mass index is 30.01 kg/m. , not in acute distress, normal state of mind Eyes:  EOMI, no exophthalmos Musculoskeletal: no gross deformities, strength intact in all four extremities, no gross restriction of joint  movements Skin:  no rashes, no hyperemia Neurological: no tremor with outstretched hands   Diabetic Foot Exam - Simple   No data filed    CMP     Component Value Date/Time   NA 136 03/06/2024 1507   K 4.0 03/06/2024 1507   CL 97 03/06/2024 1507   CO2 22 03/06/2024 1507   GLUCOSE 201 (H) 03/06/2024 1507   GLUCOSE 154 (H) 05/29/2018 0217   BUN 13 03/06/2024 1507   CREATININE 0.93 03/06/2024 1507   CREATININE 0.95 12/31/2012 0857   CALCIUM  9.4 03/06/2024 1507   PROT 6.8 11/26/2023 0943   ALBUMIN 4.3 11/26/2023 0943   AST 15 11/26/2023 0943   ALT 15 11/26/2023 0943   ALKPHOS 70 11/26/2023 0943   BILITOT 0.4 11/26/2023 0943   GFRNONAA 68 07/07/2020 1028   GFRNONAA 81 12/31/2012 0857   GFRAA 78 07/07/2020 1028   GFRAA >89 12/31/2012 0857    Diabetic Labs (most recent): Lab Results  Component Value Date   HGBA1C 9.3 (A) 11/01/2023   HGBA1C 8.7 (A) 07/03/2023   HGBA1C 8.8 (A) 12/20/2022   MICROALBUR 30 10/04/2021   MICROALBUR 10 12/30/2020   MICROALBUR 20 01/26/2015     Lipid Panel ( most recent) Lipid Panel     Component Value Date/Time   CHOL 95 (L) 11/26/2023 0943   CHOL 157 12/31/2012 0857   TRIG 116 11/26/2023 0943   TRIG 271 (H) 02/17/2016 0824   TRIG 184 (H) 12/31/2012 0857   HDL 43 11/26/2023 0943   HDL 44 02/17/2016 0824   HDL 45 12/31/2012 0857   CHOLHDL 2.2 11/26/2023 0943   LDLCALC 31 11/26/2023 0943   LDLCALC 52 01/19/2014 1007   LDLCALC 75 12/31/2012 0857   LDLDIRECT 39 11/02/2016 0806      Lab Results  Component Value Date   TSH 3.350 07/07/2020   TSH 2.720 12/30/2019   TSH 2.290 09/03/2014   FREET4 1.42 07/07/2020     Assessment & Plan:   1) DM type 2 causing vascular disease (HCC)  -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  - Marvin Frost has currently uncontrolled symptomatic type 2 DM since  79 years of age.  He presents today with his CGM and logs showing above target glycemic profile  overall.  His POCT A1c today is 9%, increasing from last visit of 7.9%.  He admits he has not done the best eating healthy over the holiday season.  He denies any recent infections.  He wonders if his Trulicity  is no longer effective for him.  He denies any hypoglycemia.  On some occasions when glucose is higher, he will increase his Lantus   to 80 units.  -I have reviewed his recent labs with him.  - I had a long discussion with him about the progressive nature of diabetes and the pathology behind its complications. -his diabetes is complicated by coronary artery disease, obesity and he remains at a high risk for more acute and chronic complications which include CAD, CVA, CKD, retinopathy, and neuropathy. These are all discussed in detail with him.  The following Lifestyle Medicine recommendations according to American College of Lifestyle Medicine [facility]) were discussed and offered to patient and he agrees to start the journey:  A. Whole Foods, Plant-based plate comprising of fruits and vegetables, plant-based proteins, whole-grain carbohydrates was discussed in detail with the patient.   A list for source of those nutrients were also provided to the patient.  Patient will use only water  or unsweetened tea for hydration. B.  The need to stay away from risky substances including alcohol, smoking; obtaining 7 to 9 hours of restorative sleep, at least 150 minutes of moderate intensity exercise weekly, the importance of healthy social connections,  and stress reduction techniques were discussed. C.  A full color page of  Calorie density of various food groups per pound showing examples of each food groups was provided to the patient.  - Nutritional counseling repeated/built upon at each appointment.  - The patient admits there is a room for improvement in their diet and drink choices. -  Suggestion is made for the patient to avoid simple carbohydrates from their diet including Cakes, Sweet Desserts /  Pastries, Ice Cream, Soda (diet and regular), Sweet Tea, Candies, Chips, Cookies, Sweet Pastries, Store Bought Juices, Alcohol in Excess of 1-2 drinks a day, Artificial Sweeteners, Coffee Creamer, and Sugar-free Products. This will help patient to have stable blood glucose profile and potentially avoid unintended weight gain.   - I encouraged the patient to switch to unprocessed or minimally processed complex starch and increased protein intake (animal or plant source), fruits, and vegetables.   - Patient is advised to stick to a routine mealtimes to eat 3 meals a day and avoid unnecessary snacks (to snack only to correct hypoglycemia).  - I have approached him with the following individualized plan to manage diabetes and patient agrees:   -He is advised to continue Lantus  70-80 units SQ nightly and Metformin  1000 mg ER daily.  I do feel his Trulicity  is no longer effective in managing his diabetes.   Will try switching him to Mounjaro  7.5 mg SQ weekly.  He has tried Victoza, Trulicity , and Ozempic  in the past.  He was unable to tolerate both Ozempic  and Victoza and Trulicity  has been ineffective.  He has also tried SGLT2i in the past without improvement.  -He is encouraged to continue monitoring blood glucose using his CGM at least 4 times per day, before meals and at bedtime and call the clinic if he gets readings less than 70 or greater than 200 for 3 tests in a row.   - Patient specific target  A1c;  LDL, HDL, Triglycerides,  were discussed in detail.  2) Blood Pressure /Hypertension:   His blood pressure is controlled to target.  He is advised to continue meds as prescribed by PCP/cardiology.    3) Lipids/Hyperlipidemia: His most recent lipid panel from 11/26/23 shows controlled LDL of 31. He is statin intolerant with myalgias.  He has tolerated taking his Crestor  every 2-3 days.  He is advised to continue taking his Crestor  40 mg po every 3 days or so  and continue his Repatha  140 mg SQ every  2 weeks.  Side effects and precautions discussed with him.  He has his annual physical coming up with PCP, will defer checking lipid panel to them.   4)  Weight/Diet:  His Body mass index is 30.01 kg/m.--   He has lost some weight over the last few visits and will benefit from continued weight loss.  I discussed with him the fact that loss of 5 - 10% of his  current body weight will have the most impact on his diabetes management.  CDE Consult will be initiated . Exercise, and detailed carbohydrates information provided  -  detailed on discharge instructions.  5) Chronic Care/Health Maintenance: -he is on ACEI/ARB and Statin medications and is encouraged to initiate and continue to follow up with Ophthalmology, Dentist, Podiatrist at least yearly or according to recommendations, and advised to stay away from smoking. I recommend yearly flu vaccine and pneumonia vaccine at least every 5 years; moderate intensity exercise for up to 150 minutes weekly; and sleep for at least 7 hours a day.  - he is advised to maintain close follow up with Zollie Lowers, MD for primary care needs, as well as his other providers for optimal and coordinated care.     I spent  44  minutes in the care of the patient today including review of labs from CMP, Lipids, Thyroid  Function, Hematology (current and previous including abstractions from other facilities); face-to-face time discussing  his blood glucose readings/logs, discussing hypoglycemia and hyperglycemia episodes and symptoms, medications doses, his options of short and long term treatment based on the latest standards of care / guidelines;  discussion about incorporating lifestyle medicine;  and documenting the encounter. Risk reduction counseling performed per USPSTF guidelines to reduce obesity and cardiovascular risk factors.     Please refer to Patient Instructions for Blood Glucose Monitoring and Insulin /Medications Dosing Guide  in media tab for additional  information. Please  also refer to  Patient Self Inventory in the Media  tab for reviewed elements of pertinent patient history.  Lynwood Sharps participated in the discussions, expressed understanding, and voiced agreement with the above plans.  All questions were answered to his satisfaction. he is encouraged to contact clinic should he have any questions or concerns prior to his return visit.   Follow up plan: - Return in about 3 months (around 10/01/2024) for Diabetes F/U with A1c in office, No previsit labs, Bring meter and logs.   Benton Rio, [facility] [address] [place], [place] [address] Phone: [phone] Fax: [phone]  07/03/2024, 2:41 PM    "

## 2024-07-15 ENCOUNTER — Ambulatory Visit: Payer: Self-pay

## 2024-07-15 ENCOUNTER — Ambulatory Visit (INDEPENDENT_AMBULATORY_CARE_PROVIDER_SITE_OTHER): Admitting: Family Medicine

## 2024-07-15 VITALS — BP 152/73 | HR 75 | Temp 98.0°F | Ht 68.0 in | Wt 196.0 lb

## 2024-07-15 DIAGNOSIS — R066 Hiccough: Secondary | ICD-10-CM | POA: Diagnosis not present

## 2024-07-15 MED ORDER — CHLORPROMAZINE HCL 10 MG PO TABS: 10.0000 mg | ORAL_TABLET | Freq: Three times a day (TID) | ORAL | 0 refills | Status: DC

## 2024-07-15 NOTE — Telephone Encounter (Signed)
 FYI Only or Action Required?: Action required by provider: update on patient condition.  Patient was last seen in primary care on 11/26/2023 by Zollie Lowers, MD.  Called Nurse Triage reporting Hiccups.  Symptoms began several days ago.  Interventions attempted: OTC medications: antacids and Rest, hydration, or home remedies.  Symptoms are: unchanged.  Triage Disposition: See HCP Within 4 Hours (Or PCP Triage)  Patient/caregiver understands and will follow disposition?: Yes  Message from [place] F sent at 07/15/2024 10:33 AM EST  Reason for Triage: Concern: constant hiccups causing breathing trouble  Symptoms:  -trouble breathing -stomach pain -trouble speaking -has to sleep in a recliner   Worsens when laying flat  When did the symptoms start?: 4-5 days   What have you done to aid in the concern ? Have you taken anything to assist with the matter?: Yes  If so, what did you take?: rolaids, liquid anti-acid (seen little relief for a few minutes)   Reason for Disposition  [1] SEVERE hiccups (e.g., Unable to eat, drink, or sleep because of hiccups) AND [2] present > 3 hours AND [3] no improvement after using Care Advice  Answer Assessment - Initial Assessment Questions 4-5days of constant hiccups- has been dealing with GERD since a baby. Unsure if heartburn started the hiccups or if the hiccups worsened the heartburn. Treating GERD with benefit but hiccups are constant.   Trouble sleeping, has to sleep in recliner bc he cannot lay flat without it worsening.Not SOB or struggling for air but feels like it disrupts his breathing  Constipation or diarrhea- is going still-  Intermittent abdominal pain- gas pain and goes away- not even cramping  Neck issue- compressed Vertebrae in the neck- nothing different  Even feels in ears on ear drums- pressure  Feels warm at night- unsure if fever DM- high but nothing crazy- saw Endo a week ago- hasn't started new med yet.   Appt with  PCP this afternoon to assess. Advised any changes or worsening to be seen in the ED. Patient understands-   1. ONSET: When did the hiccups begin?      Last week  2. SEVERITY: How bad are the hiccups now? (e.g., none, mild, moderate, severe)    Constant for days 3. TREATMENT: What have you done so far to treat the hiccups?     Antacids 4. RECURRENT SYMPTOM: Have you ever had severe hiccups before? If Yes, ask: When was the last time? and What happened that time?      Not prolonged like this 5. OTHER SYMPTOMS: Do you have any other symptoms? (e.g., chest pain, difficulty breathing,  abdomen pain, vomiting)     Spasms versus hiccups  Protocols used: Hiccups-A-AH

## 2024-07-15 NOTE — Progress Notes (Signed)
 "  Subjective:  Patient ID: Marvin Frost, male    DOB: January 10, 1946  Age: 79 y.o. MRN: [id number]  CC: Hiccups (Several days of hiccups. All day long. Doesn't hurt but interrupting speech and breathing. No pain. HX of gerd. Not sure if related but feels bloated and not much of appetite. Eating small portions. Worse in laying down flat so having to sleep in recliner. Some diarrhe and some constipation off and on. )   HPI  Discussed the use of AI scribe software for clinical note transcription with the patient, who gave verbal consent to proceed.  History of Present Illness Marvin Frost is an 79 year old male with GERD and hiatal hernia who presents with persistent hiccups.  He has been experiencing persistent hiccups for the past five to six days, described as 'spasm-like' interruptions that affect his breathing and speech. The hiccups occur in series, ranging from one to five in a row, and are felt as pressure even in his eardrums.  The hiccups worsen when he lies flat, prompting him to sleep in a recliner in a slightly elevated position, which provides some relief but does not completely resolve the issue. The hiccups seem to subside during sleep but resume shortly after he becomes active again.  He has a history of hiatal hernia and GERD, with recent reflux symptoms coinciding with the onset of the hiccups. He is uncertain whether the reflux or the hiccups started first.  He recalls a similar experience with hiccups following gallbladder surgery about a year ago, which caused significant discomfort due to the proximity of the surgical site to the diaphragm.          07/15/2024    3:48 PM 11/26/2023    8:54 AM 08/06/2023    4:07 PM  Depression screen PHQ 2/9  Decreased Interest 0 0 0  Down, Depressed, Hopeless 0 0 0  PHQ - 2 Score 0 0 0  Altered sleeping 0 0   Tired, decreased energy 0 0   Change in appetite 0 0   Feeling bad or failure about yourself  0 0   Trouble concentrating 0 0    Moving slowly or fidgety/restless 0 0   Suicidal thoughts 0 0   PHQ-9 Score 0 0    Difficult doing work/chores Not difficult at all Not difficult at all      Data saved with a previous flowsheet row definition    History Marvin Frost has a past medical history of Allergy, Anemia, Arthritis, CAD (coronary artery disease), Cancer (HCC), Cataract, Cervical radiculopathy (09/14/2020), Diabetes mellitus (1995), Dysrhythmia, Gastric polyps (09/18/2017), GERD (gastroesophageal reflux disease), History of frequent urinary tract infections, History of hiatal hernia, Hyperlipemia, Hypertension, Obesity, and Pancreatitis.   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   His family history includes Diabetes in his brother; Early death in his father; GI problems in his sister; Healthy in his daughter; Heart attack in his mother; Heart attack (age of onset: 41) in his father; Heart attack (age of onset: 74) in his brother; Heart disease in his brother, father, and mother.He reports that he quit  smoking about 40 years ago. His smoking use included cigarettes. He started smoking about 50 years ago. He has a 10 pack-year smoking history. He quit smokeless tobacco use about 46 years ago.  His smokeless tobacco use included chew. He reports that he does not drink alcohol and does not use drugs.    ROS Review of Systems  Objective:  BP (!) 152/73   Pulse 75   Temp 98 F (36.7 C)   Ht 5' 8 (1.727  m)   Wt 196 lb (88.9 kg)   SpO2 95%   BMI 29.80 kg/m   BP Readings from Last 3 Encounters:  07/15/24 (!) 152/73  07/03/24 114/78  03/03/24 130/78    Wt Readings from Last 3 Encounters:  07/15/24 196 lb (88.9 kg)  07/03/24 197 lb 6.4 oz (89.5 kg)  03/03/24 197 lb (89.4 kg)     Physical Exam Physical Exam GENERAL: Alert, cooperative, well developed, no acute distress. HEENT: Normocephalic, normal oropharynx, moist mucous membranes. CHEST: Clear to auscultation bilaterally, no wheezes, rhonchi, or crackles. CARDIOVASCULAR: Normal heart rate and rhythm, S1 and S2 normal without murmurs. ABDOMEN: Soft, non-tender, non-distended, without organomegaly, normal bowel sounds. EXTREMITIES: No cyanosis or edema. NEUROLOGICAL: Cranial nerves grossly intact, moves all extremities without gross motor or sensory deficit.   Assessment & Plan:  Hiccup  Other orders -     chlorproMAZINE  HCl; Take 1 tablet (10 mg total) by mouth 3 (three) times daily. For hiccups  Dispense: 12 tablet; Refill: 0    Assessment and Plan Assessment & Plan Persistent hiccups   Hiccups have persisted for 5-6 days, likely due to diaphragm spasm. Symptoms worsen when lying down and improve with elevation. Neurological causes are considered, but there is no indication of recent stroke. He had a similar episode post-cholecystectomy. Chlorpromazine  is chosen for its efficacy in calming diaphragm spasms, with a dose adjusted for age to prevent side effects. Prescribed chlorpromazine  10 mg orally three times a day for 3-4 days. He should call if symptoms do not improve by mid-afternoon on Thursday for potential dose adjustment. Prescription sent to [facility].       Follow-up: No follow-ups on file.  Butler Der, M.D. "

## 2024-07-15 NOTE — Patient Instructions (Signed)
" °  VISIT SUMMARY: During your visit, we discussed your persistent hiccups that have been ongoing for the past five to six days. We reviewed your symptoms and history, including your GERD and hiatal hernia.  YOUR PLAN: PERSISTENT HICCUPS: You have been experiencing persistent hiccups likely due to diaphragm spasms, which worsen when lying down and improve with elevation. -Take chlorpromazine  10 mg orally three times a day for 3-4 days. -Call if symptoms do not improve by mid-afternoon on Thursday for potential dose adjustment. -Prescription has been sent to [facility].    Contains text generated by [employer].   "

## 2024-07-15 NOTE — Telephone Encounter (Signed)
 Noted. Patient is scheduled to see PCP today for hiccups that patient has been having for the past several days.

## 2024-07-16 ENCOUNTER — Telehealth: Payer: Self-pay | Admitting: Family Medicine

## 2024-07-16 ENCOUNTER — Other Ambulatory Visit: Payer: Self-pay

## 2024-07-16 MED ORDER — CHLORPROMAZINE HCL 10 MG PO TABS: 10.0000 mg | ORAL_TABLET | Freq: Three times a day (TID) | ORAL | 0 refills | Status: AC

## 2024-07-16 NOTE — Telephone Encounter (Signed)
 The computer picked up my comment about having hiccups and assumed it was you. I will correct.

## 2024-07-16 NOTE — Telephone Encounter (Signed)
 Copied from CRM [id number]. Topic: Clinical - Prescription Issue >> Jul 15, 2024  5:44 PM Delon T wrote: Reason for CRM: chlorproMAZINE  (THORAZINE ) 10 MG tablet- this medication was supposed to go to [facility] in [place]- please inform when done [phone]

## 2024-07-16 NOTE — Telephone Encounter (Signed)
 Copied from CRM #[id number]. Topic: Clinical - Medical Advice >> Jul 16, 2024  9:30 AM Cherylann RAMAN wrote: Reason for CRM: Patient was reading his summary for his last visit with Dr. Zollie and noticed that in the las paragraph under History of Present Illness it states, He recalls a similar experience with hiccups following gallbladder surgery about a year ago, which caused significant discomfort due to the proximity of the surgical site to the diaphragm. However, patient did not have Gallbladder surgery. Please advise. Patient can be contacted at [phone]. In addition, patient medication may not be delivered to him until tomorrow due to a mix up with the pharmacy medication was supposed to be sent to. [facility] [place], [place] instead of the [facility] [employer] [facility]. As he wanted to advise Dr. Zollie of the issue due to being asked by Dr. Zollie to call him on 07/17/24 to advise him on how the medication is working for him.

## 2024-07-22 ENCOUNTER — Encounter: Admitting: Family Medicine

## 2024-08-06 ENCOUNTER — Ambulatory Visit: Payer: TRICARE For Life (TFL)

## 2024-08-08 ENCOUNTER — Ambulatory Visit

## 2024-08-28 ENCOUNTER — Encounter: Admitting: Family Medicine

## 2024-10-14 ENCOUNTER — Ambulatory Visit: Admitting: Nurse Practitioner
